# Patient Record
Sex: Female | Born: 1980 | Race: White | Hispanic: No | Marital: Married | Smoking: Never smoker
Health system: Southern US, Community
[De-identification: ages and names within clinical notes are randomized; demographics above are authoritative.]

## PROBLEM LIST (undated history)

## (undated) DIAGNOSIS — C719 Malignant neoplasm of brain, unspecified: Secondary | ICD-10-CM

## (undated) DIAGNOSIS — R569 Unspecified convulsions: Secondary | ICD-10-CM

## (undated) DIAGNOSIS — R232 Flushing: Secondary | ICD-10-CM

## (undated) DIAGNOSIS — D497 Neoplasm of unspecified behavior of endocrine glands and other parts of nervous system: Secondary | ICD-10-CM

## (undated) DIAGNOSIS — D496 Neoplasm of unspecified behavior of brain: Secondary | ICD-10-CM

## (undated) DIAGNOSIS — R5382 Chronic fatigue, unspecified: Secondary | ICD-10-CM

## (undated) DIAGNOSIS — G40A01 Absence epileptic syndrome, not intractable, with status epilepticus: Secondary | ICD-10-CM

## (undated) DIAGNOSIS — C801 Malignant (primary) neoplasm, unspecified: Secondary | ICD-10-CM

## (undated) DIAGNOSIS — T8859XA Other complications of anesthesia, initial encounter: Secondary | ICD-10-CM

## (undated) DIAGNOSIS — D1803 Hemangioma of intra-abdominal structures: Secondary | ICD-10-CM

## (undated) DIAGNOSIS — R4702 Dysphasia: Secondary | ICD-10-CM

## (undated) DIAGNOSIS — B999 Unspecified infectious disease: Secondary | ICD-10-CM

## (undated) DIAGNOSIS — R51 Headache: Secondary | ICD-10-CM

## (undated) DIAGNOSIS — F419 Anxiety disorder, unspecified: Secondary | ICD-10-CM

## (undated) DIAGNOSIS — R42 Dizziness and giddiness: Secondary | ICD-10-CM

## (undated) DIAGNOSIS — T4145XA Adverse effect of unspecified anesthetic, initial encounter: Secondary | ICD-10-CM

## (undated) DIAGNOSIS — D649 Anemia, unspecified: Secondary | ICD-10-CM

## (undated) DIAGNOSIS — R110 Nausea: Secondary | ICD-10-CM

## (undated) DIAGNOSIS — O9270 Unspecified disorders of lactation: Secondary | ICD-10-CM

## (undated) DIAGNOSIS — M792 Neuralgia and neuritis, unspecified: Secondary | ICD-10-CM

## (undated) DIAGNOSIS — K769 Liver disease, unspecified: Secondary | ICD-10-CM

## (undated) DIAGNOSIS — IMO0002 Reserved for concepts with insufficient information to code with codable children: Secondary | ICD-10-CM

## (undated) DIAGNOSIS — R4189 Other symptoms and signs involving cognitive functions and awareness: Secondary | ICD-10-CM

## (undated) HISTORY — PX: WISDOM TOOTH EXTRACTION: SHX21

## (undated) HISTORY — DX: Chronic fatigue, unspecified: R53.82

## (undated) HISTORY — DX: Headache: R51

## (undated) HISTORY — PX: EYE SURGERY: SHX253

## (undated) HISTORY — DX: Neuralgia and neuritis, unspecified: M79.2

## (undated) HISTORY — DX: Liver disease, unspecified: K76.9

## (undated) HISTORY — DX: Flushing: R23.2

## (undated) HISTORY — DX: Nausea: R11.0

## (undated) HISTORY — PX: ABDOMINAL HYSTERECTOMY: SHX81

## (undated) HISTORY — DX: Other symptoms and signs involving cognitive functions and awareness: R41.89

## (undated) HISTORY — DX: Dysphasia: R47.02

## (undated) HISTORY — PX: ROTATOR CUFF REPAIR: SHX139

## (undated) HISTORY — DX: Unspecified disorders of lactation: O92.70

---
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
## 2001-03-06 ENCOUNTER — Other Ambulatory Visit: Admission: RE | Admit: 2001-03-06 | Discharge: 2001-03-06 | Payer: Self-pay | Admitting: *Deleted

## 2001-05-23 ENCOUNTER — Encounter: Payer: Self-pay | Admitting: Internal Medicine

## 2001-05-23 ENCOUNTER — Encounter: Admission: RE | Admit: 2001-05-23 | Discharge: 2001-05-23 | Payer: Self-pay | Admitting: Internal Medicine

## 2001-06-08 ENCOUNTER — Encounter: Payer: Self-pay | Admitting: Internal Medicine

## 2001-06-08 ENCOUNTER — Ambulatory Visit (HOSPITAL_COMMUNITY): Admission: RE | Admit: 2001-06-08 | Discharge: 2001-06-08 | Payer: Self-pay | Admitting: Internal Medicine

## 2001-07-05 ENCOUNTER — Other Ambulatory Visit: Admission: RE | Admit: 2001-07-05 | Discharge: 2001-07-05 | Payer: Self-pay | Admitting: *Deleted

## 2001-07-18 DIAGNOSIS — C801 Malignant (primary) neoplasm, unspecified: Secondary | ICD-10-CM

## 2001-07-18 HISTORY — DX: Malignant (primary) neoplasm, unspecified: C80.1

## 2001-11-05 ENCOUNTER — Encounter: Payer: Self-pay | Admitting: Internal Medicine

## 2001-11-05 ENCOUNTER — Ambulatory Visit (HOSPITAL_COMMUNITY): Admission: RE | Admit: 2001-11-05 | Discharge: 2001-11-05 | Payer: Self-pay | Admitting: Internal Medicine

## 2002-02-20 ENCOUNTER — Other Ambulatory Visit: Admission: RE | Admit: 2002-02-20 | Discharge: 2002-02-20 | Payer: Self-pay | Admitting: *Deleted

## 2002-04-26 ENCOUNTER — Other Ambulatory Visit: Admission: RE | Admit: 2002-04-26 | Discharge: 2002-04-26 | Payer: Self-pay | Admitting: General Surgery

## 2002-04-30 ENCOUNTER — Encounter: Payer: Self-pay | Admitting: General Surgery

## 2002-04-30 ENCOUNTER — Encounter: Admission: RE | Admit: 2002-04-30 | Discharge: 2002-04-30 | Payer: Self-pay | Admitting: General Surgery

## 2003-07-19 HISTORY — PX: OTHER SURGICAL HISTORY: SHX169

## 2005-01-29 ENCOUNTER — Ambulatory Visit (HOSPITAL_COMMUNITY): Admission: RE | Admit: 2005-01-29 | Discharge: 2005-01-29 | Payer: Self-pay | Admitting: Sports Medicine

## 2006-08-09 ENCOUNTER — Ambulatory Visit (HOSPITAL_COMMUNITY): Admission: RE | Admit: 2006-08-09 | Discharge: 2006-08-09 | Payer: Self-pay | Admitting: Obstetrics and Gynecology

## 2006-09-20 ENCOUNTER — Ambulatory Visit (HOSPITAL_COMMUNITY): Admission: RE | Admit: 2006-09-20 | Discharge: 2006-09-20 | Payer: Self-pay | Admitting: Obstetrics and Gynecology

## 2006-09-28 ENCOUNTER — Ambulatory Visit (HOSPITAL_COMMUNITY): Admission: RE | Admit: 2006-09-28 | Discharge: 2006-09-28 | Payer: Self-pay | Admitting: Obstetrics and Gynecology

## 2006-10-30 ENCOUNTER — Inpatient Hospital Stay (HOSPITAL_COMMUNITY): Admission: AD | Admit: 2006-10-30 | Discharge: 2006-10-30 | Payer: Self-pay | Admitting: Obstetrics and Gynecology

## 2006-11-24 ENCOUNTER — Inpatient Hospital Stay (HOSPITAL_COMMUNITY): Admission: AD | Admit: 2006-11-24 | Discharge: 2006-11-24 | Payer: Self-pay | Admitting: Obstetrics and Gynecology

## 2006-12-01 ENCOUNTER — Inpatient Hospital Stay (HOSPITAL_COMMUNITY): Admission: AD | Admit: 2006-12-01 | Discharge: 2006-12-01 | Payer: Self-pay | Admitting: Obstetrics and Gynecology

## 2006-12-06 ENCOUNTER — Inpatient Hospital Stay (HOSPITAL_COMMUNITY): Admission: AD | Admit: 2006-12-06 | Discharge: 2006-12-09 | Payer: Self-pay | Admitting: Obstetrics and Gynecology

## 2006-12-25 ENCOUNTER — Encounter: Admission: RE | Admit: 2006-12-25 | Discharge: 2007-01-23 | Payer: Self-pay | Admitting: Obstetrics and Gynecology

## 2007-01-24 ENCOUNTER — Encounter: Admission: RE | Admit: 2007-01-24 | Discharge: 2007-02-20 | Payer: Self-pay | Admitting: Obstetrics and Gynecology

## 2007-02-14 ENCOUNTER — Encounter: Admission: RE | Admit: 2007-02-14 | Discharge: 2007-02-14 | Payer: Self-pay | Admitting: Sports Medicine

## 2007-03-01 ENCOUNTER — Encounter: Admission: RE | Admit: 2007-03-01 | Discharge: 2007-03-01 | Payer: Self-pay | Admitting: Sports Medicine

## 2007-03-15 ENCOUNTER — Encounter: Admission: RE | Admit: 2007-03-15 | Discharge: 2007-03-15 | Payer: Self-pay | Admitting: Sports Medicine

## 2007-07-16 ENCOUNTER — Encounter: Admission: RE | Admit: 2007-07-16 | Discharge: 2007-07-16 | Payer: Self-pay | Admitting: Neurosurgery

## 2007-07-26 ENCOUNTER — Emergency Department (HOSPITAL_COMMUNITY): Admission: EM | Admit: 2007-07-26 | Discharge: 2007-07-26 | Payer: Self-pay | Admitting: Emergency Medicine

## 2007-07-27 ENCOUNTER — Emergency Department (HOSPITAL_COMMUNITY): Admission: EM | Admit: 2007-07-27 | Discharge: 2007-07-27 | Payer: Self-pay | Admitting: Emergency Medicine

## 2007-08-13 ENCOUNTER — Ambulatory Visit: Payer: Self-pay | Admitting: Hospitalist

## 2007-08-13 ENCOUNTER — Inpatient Hospital Stay (HOSPITAL_COMMUNITY): Admission: EM | Admit: 2007-08-13 | Discharge: 2007-08-14 | Payer: Self-pay | Admitting: Emergency Medicine

## 2007-08-18 ENCOUNTER — Emergency Department (HOSPITAL_COMMUNITY): Admission: EM | Admit: 2007-08-18 | Discharge: 2007-08-18 | Payer: Self-pay | Admitting: Emergency Medicine

## 2007-08-24 ENCOUNTER — Encounter: Admission: RE | Admit: 2007-08-24 | Discharge: 2007-08-24 | Payer: Self-pay | Admitting: General Surgery

## 2008-01-18 ENCOUNTER — Emergency Department (HOSPITAL_COMMUNITY): Admission: EM | Admit: 2008-01-18 | Discharge: 2008-01-18 | Payer: Self-pay | Admitting: Emergency Medicine

## 2008-03-14 ENCOUNTER — Encounter: Admission: RE | Admit: 2008-03-14 | Discharge: 2008-03-14 | Payer: Self-pay | Admitting: Sports Medicine

## 2008-03-26 ENCOUNTER — Encounter: Admission: RE | Admit: 2008-03-26 | Discharge: 2008-03-26 | Payer: Self-pay | Admitting: Sports Medicine

## 2008-04-08 ENCOUNTER — Encounter: Admission: RE | Admit: 2008-04-08 | Discharge: 2008-04-08 | Payer: Self-pay | Admitting: Sports Medicine

## 2008-06-18 ENCOUNTER — Encounter: Admission: RE | Admit: 2008-06-18 | Discharge: 2008-06-18 | Payer: Self-pay | Admitting: Neurosurgery

## 2009-07-24 ENCOUNTER — Telehealth: Payer: Self-pay | Admitting: Internal Medicine

## 2009-07-24 ENCOUNTER — Ambulatory Visit: Payer: Self-pay | Admitting: Internal Medicine

## 2009-07-24 DIAGNOSIS — R079 Chest pain, unspecified: Secondary | ICD-10-CM | POA: Insufficient documentation

## 2009-07-24 DIAGNOSIS — J45909 Unspecified asthma, uncomplicated: Secondary | ICD-10-CM | POA: Insufficient documentation

## 2009-07-24 DIAGNOSIS — J309 Allergic rhinitis, unspecified: Secondary | ICD-10-CM | POA: Insufficient documentation

## 2009-07-27 ENCOUNTER — Telehealth (INDEPENDENT_AMBULATORY_CARE_PROVIDER_SITE_OTHER): Payer: Self-pay | Admitting: *Deleted

## 2009-07-27 LAB — CONVERTED CEMR LAB
Basophils Absolute: 0 10*3/uL (ref 0.0–0.1)
Basophils Relative: 0 % (ref 0–1)
Eosinophils Absolute: 0.1 10*3/uL (ref 0.0–0.7)
Eosinophils Relative: 1 % (ref 0–5)
HCT: 39.4 % (ref 36.0–46.0)
Hemoglobin: 12.7 g/dL (ref 12.0–15.0)
Lymphocytes Relative: 23 % (ref 12–46)
Lymphs Abs: 1.4 10*3/uL (ref 0.7–4.0)
MCHC: 32.2 g/dL (ref 30.0–36.0)
MCV: 93.8 fL (ref 78.0–100.0)
Monocytes Absolute: 0.4 10*3/uL (ref 0.1–1.0)
Monocytes Relative: 6 % (ref 3–12)
Neutro Abs: 4.5 10*3/uL (ref 1.7–7.7)
Neutrophils Relative %: 70 % (ref 43–77)
Platelets: 217 10*3/uL (ref 150–400)
RBC: 4.2 M/uL (ref 3.87–5.11)
RDW: 13.3 % (ref 11.5–15.5)
WBC: 6.4 10*3/uL (ref 4.0–10.5)

## 2010-04-19 ENCOUNTER — Emergency Department (HOSPITAL_COMMUNITY): Admission: EM | Admit: 2010-04-19 | Discharge: 2010-04-19 | Payer: Self-pay | Admitting: Emergency Medicine

## 2010-08-17 NOTE — Progress Notes (Signed)
Summary: pain  Phone Note Call from Patient Call back at [facility] Phone [phone]   Details for Reason: PATIENT LEFT MESSAGE ON VM REQUESTING CALL BACK ABOUT "WHY IS SHE HAVING PAIN, WHEN CXR WAS NEGATIVE" Summary of Call: Discussed with Dr. Alwyn Ren, ?torn chest wall muscle.  Discussed with patient.  Advised to start meds, rest.  OV in 3 weeks.  ED if increased pain, sob, or sxs.  Reviewed meds with pt.      Initial call taken by: Shary Decamp,  July 24, 2009 5:29 PM  Follow-up for Phone Call        see Appendum to Xray & labs. CT scan w & w/o if symptoms persist or progress Follow-up by: Marga Melnick MD,  July 24, 2009 6:05 PM

## 2010-08-17 NOTE — Progress Notes (Signed)
Summary: lab results  Phone Note Outgoing Call   Summary of Call: left message to call  office.................Marland KitchenFelecia Deloach CMA  July 27, 2009 9:17 AM   This is best report possible. Xray will not show soft tissue(cartilage/ muscle)  injury to intercostal structures ,but no displaced rib fractures or pneumothorax.  Excellent ; blood counts & screen for pulmonary emboli are normal .   Follow-up for Phone Call        pt aware...........Marland KitchenFelecia Deloach CMA  July 27, 2009 2:56 PM

## 2010-08-17 NOTE — Progress Notes (Signed)
Summary: rx- need directions  Phone Note Refill Request Message from:  Fax from Pharmacy [facility] fax [phone]  Refills Requested: Medication #1:  VICODIN 10-325 take as directed. Need Direction for a control.  Initial call taken by: Barb Merino,  July 24, 2009 4:56 PM  Follow-up for Phone Call        1 by mouth every 4 hours prn Follow-up by: Shary Decamp,  July 24, 2009 5:23 PM

## 2010-08-17 NOTE — Assessment & Plan Note (Signed)
Summary: NEW ACUTE/SELP PAY/NS/KDC/pneumonia   Vital Signs:  Patient profile:   30 year old female Weight:      183 pounds O2 Sat:      100 % Temp:     98.5 degrees F oral Pulse rate:   72 / minute Resp:     16 per minute BP sitting:   120 / 82  (left arm)  Vitals Entered By: Jeremy Johann CMA (July 24, 2009 1:45 PM) CC: bronchitis   CC:  bronchitis.  History of Present Illness: Bronchitis symptoms X 4 weeks; chest pain X 1 week with progression. The patient reports resting chest pain, diaphoresis, shortness of breath, palpitations (with MDI), and light headedness. She  denies nausea, vomiting, dizziness, syncope, or  indigestion.  The pain is described as constant, sharp, and burning.  The pain is located in the right anterior chest.  The pain radiates to the back in scapular area.  The pain is brought on or made worse by deep breathing and upper body movement.  Vicodin for DDD "helps" .Rx: Steroid doespack (stopped 2 days short), Zpack X 2. PMH of asthma.  Preventive Screening-Counseling & Management  Alcohol-Tobacco     Smoking Status: never  Caffeine-Diet-Exercise     Does Patient Exercise: yes  Allergies (verified): No Known Drug Allergies  Past History:  Past Medical History: DDD, Dr Channing Mutters Allergic rhinitis Asthma PTX age 70, spontaneous resolution; VItamin D deficiency  Past Surgical History: Knee surgery ; G 2 P 2 ,  Caesarean section X1  Family History: Father: CAD, thyroid disease Mother: ovarian CA, arthritis Siblings: neg  Social History: Occupation:[employer] Never Smoked Alcohol use-no Regular exercise-yes Smoking Status:  never Does Patient Exercise:  yes  Review of Systems General:  Complains of chills and fever. ENT:  Complains of decreased hearing and ringing in ears; denies nasal congestion and sinus pressure; No frontal headache, facial pain. CV:  Complains of difficulty breathing at night and difficulty breathing while lying  down; denies bluish discoloration of lips or nails, swelling of feet, and swelling of hands. Resp:  Complains of coughing up blood and sputum productive; denies chest pain with inspiration and pleuritic; "Striations of blood in mucus". Scant sputum ,green/ yellow.. Derm:  Denies lesion(s) and rash. Heme:  Denies abnormal bruising and bleeding.  Physical Exam  General:  well-nourished, alert,appropriate and cooperative throughout examination; moderately uncomfortable-appearing.   Eyes:  No corneal or conjunctival inflammation noted.no icterus. Perrla. Ears:  External ear exam shows no significant lesions or deformities.  Otoscopic examination reveals clear canals, tympanic membranes are intact bilaterally without bulging, retraction, inflammation or discharge. Hearing is grossly normal bilaterally. Nose:  External nasal examination shows no deformity or inflammation. Nasal mucosa are pink and moist without lesions or exudates. Mouth:  Oral mucosa and oropharynx without lesions or exudates.  Teeth in good repair. Chest Wall:  chest wall tenderness.   Lungs:  R decreased breath sounds.Splinting on R . Harsh barking cough Heart:  Normal rate and regular rhythm. S1 and S2 normal without gallop, murmur, click, rub. S4 Pulses:  R and L carotid,radial,dorsalis pedis and posterior tibial pulses are full and equal bilaterally Extremities:  No clubbing, cyanosis, edema. Neg Homan's  Neurologic:  alert & oriented X3.   Skin:  Intact without suspicious lesions or rashes. Damp skin Cervical Nodes:  No lymphadenopathy noted Axillary Nodes:  No palpable lymphadenopathy Psych:  memory intact for recent and remote, normally interactive, and good eye contact.  Impression & Recommendations:  Problem # 1:  CHEST PAIN (ICD-786.50)  R/O rib fracture vs PTX(Note: PMH of PTX)  Orders: Venipuncture (29562) T-D-Dimer Fibrin Derivatives Quantitive (13086-57846) T-2 View CXR (71020TC)  Problem # 2:   BRONCHITIS-ACUTE (ICD-466.0)  Protracted over 4 weeks  Orders: Venipuncture (96295) T-2 View CXR (71020TC)  Her updated medication list for this problem includes:    Tussionex Pennkinetic Er 8-10 Mg/3ml Lqcr (Chlorpheniramine-hydrocodone) .Marland Kitchen... 1 tsp q 8 hrs as needed cough    Symbicort 160-4.5 Mcg/act Aero (Budesonide-formoterol fumarate) .Marland Kitchen... 1-2 puffs q 12 hrs; gargle & spit after use    Avelox Abc Pack 400 Mg Tabs (Moxifloxacin hcl) .Marland Kitchen... 1 once daily  Problem # 3:  ASTHMA (ICD-493.90)  Clinically no wheezing  Orders: Venipuncture (28413) T-2 View CXR (71020TC)  Her updated medication list for this problem includes:    Symbicort 160-4.5 Mcg/act Aero (Budesonide-formoterol fumarate) .Marland Kitchen... 1-2 puffs q 12 hrs; gargle & spit after use  Complete Medication List: 1)  Soma 350 Mg Tabs (Carisoprodol) .... Take as directed 2)  Vicodin 10-325  .... Take as directed 3)  Tussionex Pennkinetic Er 8-10 Mg/32ml Lqcr (Chlorpheniramine-hydrocodone) .Marland Kitchen.. 1 tsp q 8 hrs as needed cough 4)  Symbicort 160-4.5 Mcg/act Aero (Budesonide-formoterol fumarate) .Marland Kitchen.. 1-2 puffs q 12 hrs; gargle & spit after use 5)  Avelox Abc Pack 400 Mg Tabs (Moxifloxacin hcl) .Marland Kitchen.. 1 once daily  Patient Instructions: 1)  Meds as directed. Remain @ [facility] until call report received. Prescriptions: AVELOX ABC PACK 400 MG TABS (MOXIFLOXACIN HCL) 1 once daily  #5 x 0   Entered and Authorized by:   Marga Melnick MD   Signed by:   Marga Melnick MD on 07/24/2009   Method used:   Historical   RxID:   [id number] SYMBICORT 160-4.5 MCG/ACT AERO (BUDESONIDE-FORMOTEROL FUMARATE) 1-2 puffs q 12 hrs; gargle & spit after use  #1 x 5   Entered and Authorized by:   Marga Melnick MD   Signed by:   Marga Melnick MD on 07/24/2009   Method used:   Historical   RxID:   [id number] TUSSIONEX PENNKINETIC ER 8-10 MG/5ML LQCR (CHLORPHENIRAMINE-HYDROCODONE) 1 tsp q 8 hrs as needed cough  #90 cc x 0   Entered and Authorized by:    Marga Melnick MD   Signed by:   Marga Melnick MD on 07/24/2009   Method used:   Print then Give to Patient   RxID:   [id number] VICODIN 10-325 take as directed  #30 x 0   Entered and Authorized by:   Marga Melnick MD   Signed by:   Marga Melnick MD on 07/24/2009   Method used:   Print then Give to Patient   RxID:   [id number]

## 2010-09-30 LAB — RAPID URINE DRUG SCREEN, HOSP PERFORMED
Amphetamines: NOT DETECTED
Barbiturates: NOT DETECTED
Benzodiazepines: NOT DETECTED
Cocaine: NOT DETECTED
Opiates: POSITIVE — AB
Tetrahydrocannabinol: NOT DETECTED

## 2010-09-30 LAB — URINALYSIS, ROUTINE W REFLEX MICROSCOPIC
Bilirubin Urine: NEGATIVE
Glucose, UA: NEGATIVE mg/dL
Hgb urine dipstick: NEGATIVE
Ketones, ur: 15 mg/dL — AB
Nitrite: NEGATIVE
Protein, ur: NEGATIVE mg/dL
Specific Gravity, Urine: 1.029 (ref 1.005–1.030)
Urobilinogen, UA: 0.2 mg/dL (ref 0.0–1.0)
pH: 5.5 (ref 5.0–8.0)

## 2010-09-30 LAB — POCT PREGNANCY, URINE: Preg Test, Ur: NEGATIVE

## 2010-11-30 NOTE — Discharge Summary (Signed)
NAME:  Victoria Bates, Victoria Bates               ACCOUNT NO.:  [id number]   MEDICAL RECORD NO.:  [id number]          PATIENT TYPE:  INP   LOCATION:  9144                          FACILITY:  [facility]   PHYSICIAN:  Juluis Mire, M.D.   DATE OF BIRTH:  [birth year]   DATE OF ADMISSION:  12/06/2006  DATE OF DISCHARGE:  12/09/2006                               DISCHARGE SUMMARY   ADMISSION DIAGNOSES:  1. Intrauterine pregnancy at 38-1/2 weeks estimated gestational age.  2. Spontaneous rupture of membranes.  3. Bulging disk of lumbar spine.   DISCHARGE DIAGNOSIS:  Status post low transverse cesarean section, a  viable female infant.   PROCEDURE:  Low transverse cesarean section.   REASON FOR ADMISSION:  Please see written H&P.   HOSPITAL COURSE:  With a 56 year old gravida 2, para 1-0-0-1 that  presented to [facility] at 39 weeks estimated  gestational age with spontaneous rupture of membranes.  Fluid was noted  to be clear.  The patient denied any vaginal bleeding irregular  contractions.  She did have a history of ruptured L4 disk and had  discussed having a primary cesarean delivery.  The patient now declined  this cesarean delivery requested the Pitocin augmentation.  Fetal heart  tones were reactive.  Cervix was noted to be 2 cm dilated throughout the  course of the day and the night.  The patient made no further progress  and decision was made to proceed with a primary low transverse cesarean  section.  The patient had been previously given an epidural and was now  dosed to an adequate surgical level.  A low transverse incision was made  with delivery of a viable female infant weighing 7 pounds 1 ounce Apgars  of 9 at 1 minute and 9 at 5 minutes.  The patient tolerated the  procedure well and taken to the recovery room in stable condition.  On  postoperative day #1 the patient was without complaint.  Vital signs  were stable.  Fundus firm and nontender.  Abdomen soft with good return  of bowel function and laboratory findings revealed hemoglobin of 8.1.  The patient was started on iron supplementation and CBC was ordered for  the morning on. Postoperative day #2 to the patient did desire early  discharge. Vital signs were stable and she was afebrile.  Fundus firm  and nontender.  Incision was clean, dry and intact.  Laboratory findings  revealed hemoglobin of stable of 8.1. Discharge instructions were  reviewed and the patient was later discharged home.   CONDITION ON DISCHARGE:  Stable, diet regular as tolerated.   ACTIVITY:  No heavy lifting, no driving x2 weeks, no vaginal entry.   FOLLOW UP:  Patient to follow up in the office in 2-3 days for staple  removal.  She is to call for temperature greater than 100 degrees,  persistent nausea, vomiting, heavy vaginal bleeding and/or redness or  drainage from incisional site.   DISCHARGE MEDICATIONS:  1. Dilaudid 2 mg #30 one p.o. every 4-6 hours.  2. Motrin 600 mg every 6 hours.  3. Ferrous sulfate  325 mg one p.o. b.i.d.  4. Prenatal vitamins one p.o. daily.      Julio Sicks, N.P.      Juluis Mire, M.D.  Electronically Signed    CC/MEDQ  D:  01/14/2007  T:  01/15/2007  Job:  [id number]

## 2010-11-30 NOTE — Discharge Summary (Signed)
NAME:  Victoria Bates, Victoria Bates               ACCOUNT NO.:  [id number]   MEDICAL RECORD NO.:  [id number]          PATIENT TYPE:  INP   LOCATION:  3305                         FACILITY:  [facility]   PHYSICIAN:  Eliseo Gum, M.D.   DATE OF BIRTH:  12/24/1980   DATE OF ADMISSION:  08/12/2007  DATE OF DISCHARGE:  08/14/2007                               DISCHARGE SUMMARY   CONSULTATIONS:  Anselmo Rod, M.D.  gastroenterology.   CONTINUITY DOCTOR:  Dr. Roger Shelter.   DISCHARGE DIAGNOSES:  1. Gastroenteritis.  2. Mild gastritis.  3. Lumbar disk rupture.  4. Asthma.   DISCHARGE MEDICATIONS:  1. Protonix 40 mg b.i.d.  2. Phenergan 12.5 mg every 6 hours for nausea as needed.  3. Vicodin 5/325 mg every 8 hours for pain as needed.   DISPOSITION AND FOLLOWUP:  Victoria Bates will followup with Dr. Roger Shelter in  the outpatient clinic for resolution of her symptoms, abdominal pain.  Her hemoglobin level need to be follow up.   PROCEDURE PERFORMED:  1. Endoscopy that shows mild gastritis.  Normal esophagus and      erythematous lesion on the vocal cord.  No peptic ulcer disease.      No Mallory-Weis tear.  2. CT of the abdomen and pelvis shows ill-defined inflammatory changes      in the proximal small bowel mesentery.  These findings suggest      nonspecific enteritis without evidence of obstruction or ileus.      Otherwise unremarkable CT of the abdomen.  CT of the pelvis was      negative.   HISTORY OF PRESENT ILLNESS:  The patient is a 30 year old female who  presented to the emergency department  complaining of nausea and  vomiting, abdominal pain, an episode of hematemesis. She is complaining  of epigastric left side abdominal pain that is started 2 days prior to  admission.  She describes 7 to 10 episodes of vomiting. She denies  diarrhea or any constipation.   VITAL SIGNS:  Blood pressure 108/54.  Pulse 108.  Respirations  18.  Temperature 99.4.   PHYSICAL EXAMINATION:  GENERAL:  The patient  was in no acute distress.  ABDOMEN:  Bowel sounds positive for tenderness  on the epigastrium area  and tender on the left lower quadrant.  No guarding.  No rebound.  Abdomen is soft.  NEUROLOGICAL:  The patient oriented.  No focal findings.   LABORATORY DATA:  Hemoglobin 13.3, white blood cells 11, platelets 185.  AMC 10.5, MCV 92, lipase 22.  Pregnancy test negative.  UA negative.  Sodium 130, potassium 3.5, chloride 100, bicarb 22, BUN 11, creatinine  0.6, glucose 115, bilirubin 1, alkaline phosphatase 38, AST 19, ALT 16,  protein 7.3, calcium 8.8, TSH 0.445, PT 13.9, INR 1.6, magnesium 1.6.   HOSPITAL COURSE:  1. Nausea and vomiting, hematemesis.  The patient was admitted to the      hospital. She was started on two large-bore IV fluids. She was      started on normal saline 150 ml/hr.  Hemoglobin was checked every  12 hours.  She responded to fluids.  Hemoglobin decreased to 11.7      then to 10.9 but she was stable, asymptomatic. On admission, she      was orthostatic but then on the day of discharge she was not      orthostatic.  Vital signs blood pressure 120 118/63 lying.      Standing blood pressure 125/65 that was on the second day of      hospitalization.  She was started on Protonix IV b.i.d.  she had an      endoscopy with results as above.  2. Left lower quadrant abdominal pain.  This was possibly secondary to      enteritis.  The patient was started on fluids and bowel rest. she      was afebrile and responding clinically.  The patient was also      advanced diet as tolerated. She was not vomiting and she denies any      diarrhea during hospitalization.  The abdominal pain improved on      the second day.   DISCHARGE LABORATORY DATA/VITALS:  On discharge the patient was stable.  Blood pressure 125/65.  Heart rate 88.  Respirations  19.  She was  tolerating liquids.  Sodium 136, potassium 3.8, chloride 109, bicarb 23,  BUN 5, creatinine 0.61, glucose 79, white  blood cells 3.8, hemoglobin  10.9, hematocrit 31.4, platelets 152. PHYSICAL EXAMINATION/> ABDOMEN:  Was soft, non-tender, and nondistended on the day of discharge.      Hartley Barefoot, MD  Electronically Signed     ______________________________  Eliseo Gum, M.D.    BR/MEDQ  D:  08/14/2007  T:  08/15/2007  Job:  [id number]   cc:   Solon Palm, D.O.

## 2010-11-30 NOTE — Op Note (Signed)
NAME:  Victoria Bates, Victoria Bates               ACCOUNT NO.:  [id number]   MEDICAL RECORD NO.:  [id number]          PATIENT TYPE:  INP   LOCATION:  3305                         FACILITY:  [facility]   PHYSICIAN:  Anselmo Rod, M.D.  DATE OF BIRTH:  05/04/1981   DATE OF PROCEDURE:  08/15/2007  DATE OF DISCHARGE:  08/14/2007                               OPERATIVE REPORT   PROCEDURE PERFORMED:  Esophagogastroduodenoscopy.   ENDOSCOPIST:  Anselmo Rod, M.D.   INSTRUMENT USED:  Pentax video panendoscope.   INDICATIONS FOR PROCEDURE:  Hematemesis and epigastric pain in a 26-year-  old white female, rule out peptic ulcer disease, esophagitis, gastritis,  etc.  The patient has a longstanding history of nonsteroidal use.  The  patient has been taking large doses of nonsteroidals, ibuprofen 800 mg 3  times a day for 2 years because of chronic back pain.   PREPROCEDURE PREPARATION:  Informed consent was procured from the  patient.  The patient was fasted for 8 hours prior to the procedure.  Risks and benefits of the procedure were discussed with her in great  detail.   PREPROCEDURE PHYSICAL:  The patient had stable vital signs.  NECK:  Supple.  Chest clear to auscultation.  S1, S2 regular.  Abdomen soft  with normal bowel sounds.  Epigastric tenderness on palpation with  guarding, no rebound or rigidity.   DESCRIPTION OF THE PROCEDURE:  The patient was placed in the left  lateral decubitus position and sedated with 75 mcg of Fentanyl and 5 mg  of Versed given intravenously in slow incremental doses.  Once the  patient was adequately sedated and maintained on low-flow oxygen and  continuous cardiac monitoring, the Pentax video panendoscope was  advanced through the mouthpiece over the tongue into the esophagus under  direct vision. The entire esophagus was widely patent with no evidence  of ring, stricture, masses, esophagitis or Barrett's mucosa.  The scope  was advanced into the stomach.  Mild  gastritis was seen in the proximal  stomach.  No ulcers, erosions, masses or polyps were seen.  The proximal  small bowel appeared normal.  There was no evidence of peptic ulcer  disease, no Mallory-Weiss tear was noticed.  A small erythematous spot  was noted in the vocal cord that may have been a source of hematemesis  from the patient's retching.  The patient tolerated the procedure well  without immediate complications.   IMPRESSION:  1. Normal-appearing esophagus and gastroesophageal junction.  No      evidence of a Mallory-Weiss tear.  2. Mild gastritis.  3. Normal-appearing proximal small bowel.  4. Erythematous lesion vocal cords questions source of hematemesis.   RECOMMENDATION:  1. Discontinue Dilaudid pump.  The patient probably has a viral      gastroenteritis.  Clear liquid diet should be started and advance      as tolerated.  2. Transfer the patient from the ICU to the floor.  3. PPI is to be continued.  4. Avoid all nonsteroidals.  5. Limit narcotic use.  6. Advance diet as tolerated.  Anselmo Rod, M.D.  Electronically Signed     JNM/MEDQ  D:  08/15/2007  T:  08/15/2007  Job:  [id number]

## 2010-11-30 NOTE — H&P (Signed)
NAME:  Victoria Bates, Victoria Bates               ACCOUNT NO.:  [id number]   MEDICAL RECORD NO.:  [id number]          PATIENT TYPE:  AMB   LOCATION:  SDC                           FACILITY:  [facility]   PHYSICIAN:  Guy Sandifer. Henderson Cloud, M.D. DATE OF BIRTH:  [birth year]   DATE OF ADMISSION:  12/07/2006  DATE OF DISCHARGE:                              HISTORY & PHYSICAL   CHIEF COMPLAINT:  1. Intrauterine pregnancy at 38-1/2 weeks estimated gestational age.  2. Bulging disk.   HISTORY OF PRESENT ILLNESS:  This patient is a 30 year old white female,  G2, P18, with an EDC of December 21, 2006, established by early exam and 15  week ultrasound, who has had pregnancy complicated by lower back pain  with a bulging disk.  This has required pain medication essentially  daily.  After careful discussion of options for delivery by the primary  cesarean section or vaginal delivery with the possibility of  exacerbation of her back pain, she is being admitted for cesarean  section.  Patient understands that her pain could increase in spite of  cesarean section as well.  Potential risks and complications have been  discussed preoperatively.   PAST MEDICAL HISTORY:  See prenatal history and physical.   PAST SURGICAL HISTORY:  See prenatal history and physical.   OBSTETRICAL HISTORY:  See prenatal history and physical.   FAMILY HISTORY:  See prenatal history and physical.   MEDICATIONS:  Vicodin daily.  Prenatal vitamin daily.   No known drug allergies.   She is sensitive to LATEX.   REVIEW OF SYSTEMS:  NEURO:  Denies headache.  CARDIAC:  No chest pain.  PULMONARY:  Denies shortness of breath.   PHYSICAL EXAMINATION:  VITAL SIGNS:  Height 5 feet 8 inches.  Weight 192  pounds.  Blood pressure 114/80.  HEENT:  Without thyromegaly.  LUNGS:  Clear to auscultation.  HEART:  Regular rate and rhythm.  BACK:  Without CVA tenderness.  BREASTS:  Not examined.  ABDOMEN:  Gravid, nontender.  PELVIC:  Cervix is 3 cm dilated, 70%  effaced, -2 station vertex.  EXTREMITIES:  Grossly within normal limits.  NEUROLOGIC:  Grossly within normal limits.   ASSESSMENT:  1. Intrauterine pregnancy at 38-1/2 weeks estimated gestational age.  2. Bulging disks.   PLAN:  Cesarean section.      Guy Sandifer Henderson Cloud, M.D.  Electronically Signed     JET/MEDQ  D:  12/06/2006  T:  12/06/2006  Job:  [id number]

## 2010-11-30 NOTE — Op Note (Signed)
NAME:  Victoria Bates, Victoria Bates               ACCOUNT NO.:  [id number]   MEDICAL RECORD NO.:  [id number]          PATIENT TYPE:  INP   LOCATION:  9162                          FACILITY:  [facility]   PHYSICIAN:  Guy Sandifer. Henderson Cloud, M.D. DATE OF BIRTH:  04/14/1981   DATE OF PROCEDURE:  12/07/2006  DATE OF DISCHARGE:                               OPERATIVE REPORT   PREOPERATIVE DIAGNOSES:  1. Intrauterine pregnancy at 38-1/2 weeks' estimated gestational age.  2. Spontaneous rupture of membranes.  3. Bulging disks in lower back.   POSTOPERATIVE DIAGNOSES:  1. Intrauterine pregnancy at 38-1/2 weeks' estimated gestational age.  2. Spontaneous rupture of membranes.  3. Bulging disks in lower back.   PROCEDURE:  Low-transverse cesarean section.   SURGEON:  Harold Hedge, M.D.   ANESTHESIA:  Epidural, Cristela Blue, M.D.   ESTIMATED BLOOD LOSS:  900 mL.   FINDINGS:  Viable female infant, Apgars of 9 and 9 at one and five  minutes, respectively.   INDICATIONS AND CONSENT:  This patient is a 30 year old married white  female, G2, P44, with an EDC of December 21, 2006 who has had bulging disks in  her lower back, given her pain throughout the pregnancy.  She has  required daily narcotics.  Approximately 2 days ago after discussion of  all the options, she was scheduled for cesarean section in an effort to  decrease the likelihood of aggravating her back pain during the second  stage of labor.  She then had spontaneous rupture of membranes at  approximately 7:30 p.m. on Dec 06, 2006.  She presented to the hospital  and at that time discussed options with Dr. Renaldo Fiddler.  A labor epidural  was placed, and she was started on Pitocin.  As of this morning, cervix  was unchanged per the nurse's check at 3 cm dilation.  Again, options  were discussed.  The fact that cesarean section eliminates second stage  of labor but is not of an absolute guarantee against further back pain  was emphasized.  Potential risks and  complications of the procedure were  discussed including but limited to infection, bowel, bladder, ureteral  damage, bleeding requiring transfusion of blood products with possible  HIV and hepatitis acquisition, DVT, PE, pneumonia.  After discussion of  all of the above, the patient is taken to operating room for cesarean  section.   PROCEDURE:  The patient is taken to operating room where she is  identified.  Epidural anesthetic is augmented to a surgical level, and  she is placed in dorsal supine position with 15-degree left lateral  wedge.  She is then prepped with Hibiclens and draped in sterile  fashion.  Foley catheter is already in place.  After testing for  adequate anesthesia, skin is entered through a Pfannenstiel incision,  and dissection is carried out layers to the peritoneum.  Peritoneum is  incised, extended superiorly and inferiorly.  Twenty mL of 0.25% plain  Marcaine are poured into the peritoneal cavity to help control any  discomfort the patient may experience.  The vesicouterine peritoneum is  then taken down cephalad laterally.  The bladder flap is developed.  The  bladder blade is placed.  Uterus is then incised in a low transverse  manner.  The uterine cavity is entered bluntly with a hemostat.  Uterine  incision is extended cephalad laterally with the fingers.  Vertex is  delivered without difficulty.  Oronasal pharynx are suctioned.  Remainder the baby is then delivered, and good cry and tone is noted.  Cord is clamped and cut.  The baby is handed to the waiting pediatrics  team.  Placenta is manually delivered.  Uterine cavity is clean.  Uterus  is closed in 2 running locking imbricating layers of 0 Monocryl suture.  Additional figure-of-eight is placed on the right angle to assure  complete hemostasis.  Anterior peritoneum is closed in a running fashion  with 0 Monocryl suture which was also used to reapproximate the  pyramidalis muscle in midline.  Anterior  rectus fascia is closed in  running fashion with 0 PDS, and the skin is closed with clips.  All  sponge, instrument and needle counts are correct, and the patient is  transferred to recovery room in stable condition.      Guy Sandifer Henderson Cloud, M.D.  Electronically Signed     JET/MEDQ  D:  12/07/2006  T:  12/07/2006  Job:  [id number]

## 2010-11-30 NOTE — Op Note (Signed)
NAME:  Victoria Bates, Victoria Bates               ACCOUNT NO.:  [id number]   MEDICAL RECORD NO.:  [id number]          PATIENT TYPE:  EMS   LOCATION:  ED                           FACILITY:  [facility]   PHYSICIAN:  Anselm Pancoast. Weatherly, M.D.DATE OF BIRTH:  06-21-1981   DATE OF PROCEDURE:  08/18/2007  DATE OF DISCHARGE:  08/18/2007                               OPERATIVE REPORT   PREOPERATIVE DIAGNOSIS:  Questionable foreign body in antecubital vein  right arm.   HISTORY:  Victoria Bates is a 30 year old female who was admitted to the  [facility] last weekend with hematemesis, had an IV started in  the right antecubital area and also one in the hand.  She was endoscoped  and found to have some bleeding from her duodenum.  She was not  transfused and approximately 2 days after she was hospitalized the  antecubital IV came out.  She had some bleeding.  She and the nurse put  some pressure on the area.  The IV was discontinued.  The patient has  felt that she could feel a little piece of the catheter in the vein  since then, was discharged and called the nursing coordinator some time  after her discharge and told her story to her.  They recommended that  she go to an urgent care facility which she did today and the physician  at the urgent care called me today after he saw her in the urgent care.  His name was Dr. Theadora Rama.  He also did an x-ray of the right  elbow.  We could not see any foreign body but the x-ray was placed on a  disk, I was called and agreed to see her in the emergency room.  When  she arrived here I called the nursing supervisor who was present when we  were discussing the options and all of [place] could possibly feel a little  firm area in the right antecubital vein just proximal to where the IV  had been inserted, but whether this was a thrombus or a piece of  catheter it was impossible to tell.  I discussed that I would try to  image it better with soft tissue views rotating  the arm to see if we  could definitely see a foreign body and the patient was in agreement.  She wanted [place] just to explore the area but we did take her over to x-ray  with her consent and did about 4 views, placed a little BB on the little  area where the needle had been inserted and on through views there was  no evidence of any catheter in the vein proximal to the where the BB  was.  Then on the fourth view I actually took a little Angiocath similar  to what they had used for the IV and taped it to the skin just proximal  and you could see the hub but the plastic catheter is not radiopaque as  we noted on this film.  The patient then felt that there was definitely  something there and I agreed to make a  little incision and explore the  area, not assuring that there was a catheter remnant.  The supervisor  was still here at this time and we used a little Betadine prep.  I put a  tourniquet at about a 40 venous occlusion pressure proximal in case  there would be something so it would not float up into the arm and then  I used about 5 mL of 1% Xylocaine to anesthetize the skin.  I made a  little transverse incision about 2 cm proximal to where the catheter  needle insertion site and then elevated the dilated vein over a  hemostat.  I then made a little transverse incision and the only thing  that we can find is there is definitely a little thrombus in the area.  I used a small hemostat and a straight hemostat to kind of open the vein  and pulled out the thrombus but we find no evidence of any catheter.  The patient works in Animal nutritionist in hair removal area and she  wanted to feel the area and put a sterile glove on so she could actually  feel the area and then gave her a little piece of the thrombus that she  rubbed between her fingers and can feel the consistency of the little  thrombus.  Afterwards with the vein elevated I could not feel anything  else in the vein.  I had placed a  little single tie of 4-0 chromic  proximal to the little otomy and there was no evidence of any  backbleeding.   At this point I recommended that we stop any further exploration.  We  offered to make a little incision about an inch further more proximal if  she desired and she said no and since we cannot feel anything further.  I then closed the skin which was a little transverse incision with three  5-0 nylon sutures and placed a little triple antibiotic ointment and a  little light dressing.  I will see her back in the office on Friday to  remove the sutures.  I do not think this is a foreign body within the  vein and I am sure the nursing supervisor will be checking the records  at [facility] on what was noted on the chart over there since she called  back in and etc.  I will be happy to see her in the office any time next  week if she is having a problem.  I do not think she needs IV  antibiotics or p.o. antibiotics and she already has Vicodin which she is  taking for back problems so no additional pain was given.  The little  incision should not bleed and I will see her in the office on Friday and  remove the little skin sutures.  This is Dr. Anselm Pancoast. Weatherly  dictating this consultation and minor exploration looking for a piece of  broken catheter and nothing was found.  We did remove a little thrombus  within the vein and could not feel anything afterwards.           ______________________________  Anselm Pancoast. Zachery Dakins, M.D.     WJW/MEDQ  D:  08/18/2007  T:  08/19/2007  Job:  [id number]

## 2010-12-03 NOTE — Discharge Summary (Signed)
[facility]  Patient:    Victoria Bates, Victoria Bates                      MRN: [id number] Adm. Date:  [id number] Disc. Date: [id number] Attending:  Tonye Royalty Dictator:   Antony Contras, N.P.                           Discharge Summary  DISCHARGE DIAGNOSES:          Intrauterine pregnancy at term, normal spontaneous vaginal delivery of a viable infant over a midline episiotomy with second degree extension, repair of bilateral vaginal lacerations.  HISTORY OF PRESENT ILLNESS:   Patient is an 30 year old prima gravida with an LMP January 15, 2001, [facility] October 23, 2000.  Prenatal course was complicated by a history of asthma.  Patient is also Rh negative.  PRENATAL LABORATORIES:        Blood type A-.  Antibody screen negative.  RPR, HBSAG, HIV nonreactive.  Toxoplasmosis negative.  MSAFP normal.  GBS negative.  HOSPITAL COURSE:              Patient presented on October 27, 2000 with onset of active labor.  On admission she was 3-4 cm, 80% with intact membranes. Labor did progress to complete dilatation.  She delivered an Apgar 8/9 female infant over a midline episiotomy with second degree extension.  There was a shoulder cord x 1.  Also, repair of bilateral vaginal lacerations.  Postpartum course:  She remained afebrile, had no difficulty voiding, was able to be discharged on her second postpartum day in satisfactory condition.  She did receive RhoGAM prior to discharge.  LABORATORIES:                 CBC:  Hematocrit 28.7, hemoglobin 9.9, WBC 13.5, platelets 164,000.  DISPOSITION:                  Follow-up in six weeks.  Continue prenatal vitamins and iron.  Motrin and Tylox for pain. DD:  11/20/00 TD:  11/20/00 Job: [id number]

## 2011-04-07 LAB — DIFFERENTIAL
Basophils Absolute: 0
Basophils Absolute: 0
Basophils Absolute: 0
Basophils Absolute: 0
Basophils Absolute: 0
Basophils Absolute: 0
Basophils Relative: 0
Basophils Relative: 1
Basophils Relative: 0
Basophils Relative: 0
Basophils Relative: 0
Basophils Relative: 1
Eosinophils Absolute: 0
Eosinophils Absolute: 0
Eosinophils Absolute: 0
Eosinophils Absolute: 0
Eosinophils Absolute: 0
Eosinophils Absolute: 0
Eosinophils Relative: 0
Eosinophils Relative: 0
Eosinophils Relative: 0
Eosinophils Relative: 0
Eosinophils Relative: 0
Eosinophils Relative: 1
Lymphocytes Relative: 17
Lymphocytes Relative: 25
Lymphocytes Relative: 10 — ABNORMAL LOW
Lymphocytes Relative: 17
Lymphocytes Relative: 3 — ABNORMAL LOW
Lymphocytes Relative: 3 — ABNORMAL LOW
Lymphs Abs: 1.3
Lymphs Abs: 1.8
Lymphs Abs: 0.2 — ABNORMAL LOW
Lymphs Abs: 0.3 — ABNORMAL LOW
Lymphs Abs: 0.5 — ABNORMAL LOW
Lymphs Abs: 0.7
Monocytes Absolute: 0.4
Monocytes Absolute: 0.6
Monocytes Absolute: 0.2
Monocytes Absolute: 0.3
Monocytes Absolute: 0.3
Monocytes Absolute: 0.4
Monocytes Relative: 6
Monocytes Relative: 8
Monocytes Relative: 10
Monocytes Relative: 2 — ABNORMAL LOW
Monocytes Relative: 2 — ABNORMAL LOW
Monocytes Relative: 5
Neutro Abs: 4.6
Neutro Abs: 5.9
Neutro Abs: 10.5 — ABNORMAL HIGH
Neutro Abs: 2.7
Neutro Abs: 4.8
Neutro Abs: 7.9 — ABNORMAL HIGH
Neutrophils Relative %: 66
Neutrophils Relative %: 77
Neutrophils Relative %: 72
Neutrophils Relative %: 85 — ABNORMAL HIGH
Neutrophils Relative %: 95 — ABNORMAL HIGH
Neutrophils Relative %: 95 — ABNORMAL HIGH

## 2011-04-07 LAB — URINALYSIS, ROUTINE W REFLEX MICROSCOPIC
Bilirubin Urine: NEGATIVE
Bilirubin Urine: NEGATIVE
Glucose, UA: NEGATIVE
Glucose, UA: NEGATIVE
Glucose, UA: NEGATIVE
Hgb urine dipstick: NEGATIVE
Hgb urine dipstick: NEGATIVE
Hgb urine dipstick: NEGATIVE
Ketones, ur: >80 — AB
Ketones, ur: >80 — AB
Ketones, ur: 40 — AB
Nitrite: NEGATIVE
Nitrite: NEGATIVE
Nitrite: NEGATIVE
Protein, ur: NEGATIVE
Protein, ur: NEGATIVE
Protein, ur: NEGATIVE
Specific Gravity, Urine: 1.025
Specific Gravity, Urine: 1.029
Specific Gravity, Urine: 1.025
Urobilinogen, UA: 0.2
Urobilinogen, UA: 0.2
Urobilinogen, UA: 0.2
pH: 6
pH: 6
pH: 5

## 2011-04-07 LAB — CBC
HCT: 38.8
HCT: 39.5
HCT: 31.4 — ABNORMAL LOW
HCT: 33 — ABNORMAL LOW
HCT: 33.7 — ABNORMAL LOW
HCT: 39.4
Hemoglobin: 13.3
Hemoglobin: 13.5
Hemoglobin: 10.9 — ABNORMAL LOW
Hemoglobin: 11.3 — ABNORMAL LOW
Hemoglobin: 11.7 — ABNORMAL LOW
Hemoglobin: 13.3
MCHC: 34.2
MCHC: 34.4
MCHC: 33.8
MCHC: 34.3
MCHC: 34.7
MCHC: 34.9
MCV: 91.1
MCV: 91.7
MCV: 91.1
MCV: 91.1
MCV: 91.1
MCV: 92
Platelets: 263
Platelets: 292
Platelets: 152
Platelets: 172
Platelets: 185
Platelets: 204
RBC: 4.26
RBC: 4.31
RBC: 3.45 — ABNORMAL LOW
RBC: 3.63 — ABNORMAL LOW
RBC: 3.7 — ABNORMAL LOW
RBC: 4.28
RDW: 11.8
RDW: 12.3
RDW: 11.6
RDW: 11.8
RDW: 11.9
RDW: 12
WBC: 7
WBC: 7.6
WBC: 11 — ABNORMAL HIGH
WBC: 3.8 — ABNORMAL LOW
WBC: 5.7
WBC: 8.4

## 2011-04-07 LAB — COMPREHENSIVE METABOLIC PANEL
ALT: 13
ALT: 12
ALT: 16
AST: 16
AST: 16
AST: 19
Albumin: 4.3
Albumin: 2.8 — ABNORMAL LOW
Albumin: 4.2
Alkaline Phosphatase: 35 — ABNORMAL LOW
Alkaline Phosphatase: 27 — ABNORMAL LOW
Alkaline Phosphatase: 38 — ABNORMAL LOW
BUN: 6
BUN: 11
BUN: 5 — ABNORMAL LOW
CO2: 19
CO2: 22
CO2: 23
Calcium: 9.4
Calcium: 8 — ABNORMAL LOW
Calcium: 8.8
Chloride: 106
Chloride: 100
Chloride: 109
Creatinine, Ser: 0.59
Creatinine, Ser: 0.58
Creatinine, Ser: 0.61
GFR calc Af Amer: >60
GFR calc Af Amer: >60
GFR calc Af Amer: >60
GFR calc non Af Amer: >60
GFR calc non Af Amer: >60
GFR calc non Af Amer: >60
Glucose, Bld: 77
Glucose, Bld: 115 — ABNORMAL HIGH
Glucose, Bld: 79
Potassium: 3.9
Potassium: 3.5
Potassium: 3.8
Sodium: 138
Sodium: 130 — ABNORMAL LOW
Sodium: 136
Total Bilirubin: 1.1
Total Bilirubin: 1
Total Bilirubin: 1
Total Protein: 7.3
Total Protein: 5.6 — ABNORMAL LOW
Total Protein: 7.3

## 2011-04-07 LAB — ABO/RH: ABO/RH(D): A NEG

## 2011-04-07 LAB — POCT I-STAT CREATININE
Creatinine, Ser: 0.7
Operator id: 192351

## 2011-04-07 LAB — I-STAT 8, (EC8 V) (CONVERTED LAB)
Acid-base deficit: 1
BUN: 8
Bicarbonate: 22.6
Chloride: 108
Glucose, Bld: 91
HCT: 44
Hemoglobin: 15
Operator id: 192351
Potassium: 3.6
Sodium: 138
TCO2: 24
pCO2, Ven: 33.1 — ABNORMAL LOW
pH, Ven: 7.443 — ABNORMAL HIGH

## 2011-04-07 LAB — TSH: TSH: 0.445

## 2011-04-07 LAB — GASTRIC OCCULT BLOOD (1-CARD TO LAB): Occult Blood, Gastric: POSITIVE — AB

## 2011-04-07 LAB — SEDIMENTATION RATE: Sed Rate: 9

## 2011-04-07 LAB — RAPID URINE DRUG SCREEN, HOSP PERFORMED
Amphetamines: NEGATIVE — AB
Barbiturates: NEGATIVE — AB
Benzodiazepines: POSITIVE — AB
Cocaine: NEGATIVE — AB
Opiates: POSITIVE — AB
Tetrahydrocannabinol: NEGATIVE — AB

## 2011-04-07 LAB — CULTURE, BLOOD (ROUTINE X 2)
Culture: NO GROWTH
Culture: NO GROWTH

## 2011-04-07 LAB — CROSSMATCH
ABO/RH(D): A NEG
Antibody Screen: NEGATIVE

## 2011-04-07 LAB — LIPASE, BLOOD
Lipase: 18
Lipase: 22

## 2011-04-07 LAB — POCT PREGNANCY, URINE
Operator id: 192351
Operator id: 198171
Preg Test, Ur: NEGATIVE
Preg Test, Ur: NEGATIVE

## 2011-04-07 LAB — H. PYLORI ANTIBODY, IGG: H Pylori IgG: 0.6

## 2011-04-07 LAB — HIV ANTIBODY (ROUTINE TESTING W REFLEX): HIV: NONREACTIVE

## 2011-04-07 LAB — PROTIME-INR
INR: 1.1
Prothrombin Time: 13.9

## 2011-04-07 LAB — PREGNANCY, URINE: Preg Test, Ur: NEGATIVE

## 2011-04-07 LAB — MAGNESIUM: Magnesium: 1.6

## 2011-04-07 LAB — LACTIC ACID, PLASMA: Lactic Acid, Venous: 0.6

## 2011-04-07 LAB — T4, FREE: Free T4: 0.99

## 2011-08-07 ENCOUNTER — Emergency Department (HOSPITAL_COMMUNITY): Payer: Self-pay

## 2011-08-07 ENCOUNTER — Emergency Department (HOSPITAL_COMMUNITY)
Admission: EM | Admit: 2011-08-07 | Discharge: 2011-08-07 | Disposition: A | Discharge status: Home / Self Care | Payer: Self-pay | Attending: Emergency Medicine | Admitting: Emergency Medicine

## 2011-08-07 ENCOUNTER — Encounter (HOSPITAL_COMMUNITY): Payer: Self-pay

## 2011-08-07 DIAGNOSIS — R112 Nausea with vomiting, unspecified: Secondary | ICD-10-CM | POA: Insufficient documentation

## 2011-08-07 DIAGNOSIS — J45909 Unspecified asthma, uncomplicated: Secondary | ICD-10-CM | POA: Insufficient documentation

## 2011-08-07 DIAGNOSIS — M549 Dorsalgia, unspecified: Secondary | ICD-10-CM | POA: Insufficient documentation

## 2011-08-07 DIAGNOSIS — R109 Unspecified abdominal pain: Secondary | ICD-10-CM | POA: Insufficient documentation

## 2011-08-07 LAB — WET PREP, GENITAL
Clue Cells Wet Prep HPF POC: NONE SEEN
Trich, Wet Prep: NONE SEEN
Yeast Wet Prep HPF POC: NONE SEEN

## 2011-08-07 LAB — CBC
HCT: 38.1 % (ref 36.0–46.0)
Hemoglobin: 13 g/dL (ref 12.0–15.0)
MCH: 31.2 pg (ref 26.0–34.0)
MCHC: 34.1 g/dL (ref 30.0–36.0)
MCV: 91.4 fL (ref 78.0–100.0)
Platelets: 277 10*3/uL (ref 150–400)
RBC: 4.17 MIL/uL (ref 3.87–5.11)
RDW: 11.9 % (ref 11.5–15.5)
WBC: 9.3 10*3/uL (ref 4.0–10.5)

## 2011-08-07 LAB — POCT PREGNANCY, URINE: Preg Test, Ur: NEGATIVE

## 2011-08-07 LAB — COMPREHENSIVE METABOLIC PANEL
ALT: 23 U/L (ref 0–35)
AST: 20 U/L (ref 0–37)
Albumin: 4.3 g/dL (ref 3.5–5.2)
Alkaline Phosphatase: 46 U/L (ref 39–117)
BUN: 16 mg/dL (ref 6–23)
CO2: 27 mEq/L (ref 19–32)
Calcium: 9.7 mg/dL (ref 8.4–10.5)
Chloride: 101 mEq/L (ref 96–112)
Creatinine, Ser: 0.64 mg/dL (ref 0.50–1.10)
GFR calc Af Amer: >90 mL/min (ref >90)
GFR calc non Af Amer: >90 mL/min (ref >90)
Glucose, Bld: 116 mg/dL — ABNORMAL HIGH (ref 70–99)
Potassium: 3.4 mEq/L — ABNORMAL LOW (ref 3.5–5.1)
Sodium: 139 mEq/L (ref 135–145)
Total Bilirubin: 0.6 mg/dL (ref 0.3–1.2)
Total Protein: 7.8 g/dL (ref 6.0–8.3)

## 2011-08-07 LAB — URINALYSIS, ROUTINE W REFLEX MICROSCOPIC
Glucose, UA: NEGATIVE mg/dL
Hgb urine dipstick: NEGATIVE
Ketones, ur: 15 mg/dL — AB
Leukocytes, UA: NEGATIVE
Nitrite: NEGATIVE
Protein, ur: 30 mg/dL — AB
Specific Gravity, Urine: 1.037 — ABNORMAL HIGH (ref 1.005–1.030)
Urobilinogen, UA: 0.2 mg/dL (ref 0.0–1.0)
pH: 6 (ref 5.0–8.0)

## 2011-08-07 LAB — DIFFERENTIAL
Basophils Absolute: 0 10*3/uL (ref 0.0–0.1)
Basophils Relative: 0 % (ref 0–1)
Eosinophils Absolute: 0 10*3/uL (ref 0.0–0.7)
Eosinophils Relative: 0 % (ref 0–5)
Lymphocytes Relative: 4 % — ABNORMAL LOW (ref 12–46)
Lymphs Abs: 0.4 10*3/uL — ABNORMAL LOW (ref 0.7–4.0)
Monocytes Absolute: 0.4 10*3/uL (ref 0.1–1.0)
Monocytes Relative: 4 % (ref 3–12)
Neutro Abs: 8.5 10*3/uL — ABNORMAL HIGH (ref 1.7–7.7)
Neutrophils Relative %: 92 % — ABNORMAL HIGH (ref 43–77)

## 2011-08-07 LAB — URINE MICROSCOPIC-ADD ON

## 2011-08-07 LAB — LIPASE, BLOOD: Lipase: 14 U/L (ref 11–59)

## 2011-08-07 MED ORDER — DICYCLOMINE HCL 10 MG/ML IM SOLN
20.0000 mg | Freq: Once | INTRAMUSCULAR | Status: AC
  Administered 2011-08-07: 20 mg via INTRAMUSCULAR
  Filled 2011-08-07: qty 2

## 2011-08-07 MED ORDER — PROMETHAZINE HCL 12.5 MG PO TABS: 12.5000 mg | ORAL_TABLET | Freq: Four times a day (QID) | ORAL | Status: DC | PRN

## 2011-08-07 MED ORDER — ONDANSETRON HCL 4 MG/2ML IJ SOLN
4.0000 mg | Freq: Once | INTRAMUSCULAR | Status: AC
  Administered 2011-08-07: 4 mg via INTRAVENOUS
  Filled 2011-08-07: qty 2

## 2011-08-07 MED ORDER — MORPHINE SULFATE 4 MG/ML IJ SOLN
4.0000 mg | Freq: Once | INTRAMUSCULAR | Status: AC
  Administered 2011-08-07: 4 mg via INTRAVENOUS
  Filled 2011-08-07: qty 1

## 2011-08-07 MED ORDER — PROMETHAZINE HCL 25 MG/ML IJ SOLN
12.5000 mg | Freq: Once | INTRAMUSCULAR | Status: AC
  Administered 2011-08-07: 12.5 mg via INTRAVENOUS
  Filled 2011-08-07: qty 1

## 2011-08-07 MED ORDER — ONDANSETRON 8 MG PO TBDP: 8.0000 mg | ORAL_TABLET | Freq: Three times a day (TID) | ORAL | Status: AC | PRN

## 2011-08-07 MED ORDER — IOHEXOL 300 MG/ML  SOLN
100.0000 mL | Freq: Once | INTRAMUSCULAR | Status: AC | PRN
  Administered 2011-08-07: 100 mL via INTRAVENOUS

## 2011-08-07 MED ORDER — HYDROCODONE-ACETAMINOPHEN 5-500 MG PO TABS: 1.0000 | ORAL_TABLET | Freq: Four times a day (QID) | ORAL | Status: AC | PRN

## 2011-08-07 MED ORDER — SODIUM CHLORIDE 0.9 % IV BOLUS (SEPSIS)
1000.0000 mL | Freq: Once | INTRAVENOUS | Status: AC
  Administered 2011-08-07: 1000 mL via INTRAVENOUS

## 2011-08-07 NOTE — ED Provider Notes (Signed)
Medical screening examination/treatment/procedure(s) were conducted as a shared visit with non-physician practitioner(s) and myself.  I personally evaluated the patient during the encounter  Patient seen examined. Pain on palpation to left upper and left lower quadrant. Will order abdominal CT  Toy Baker, MD 08/07/11 1413

## 2011-08-07 NOTE — ED Notes (Signed)
LLQ pain, onset last night, report had vomited multiple times. Pain 10/10, worsen at movement, cramping and aching. Denied blood in emesis, denied any urinary symptoms. Not passing gas, Last intake yesterday morning. LBM 2-3 days ago. Left side tender upon palpation. Hypoactive bowel sound noted. Pt tearful.

## 2011-08-07 NOTE — ED Notes (Signed)
Pt c/o LLQ pain, onset last night, with nausea and vomiting.

## 2011-08-07 NOTE — ED Provider Notes (Signed)
History     CSN: [id number]  Arrival date & time 08/07/11  1121   First MD Initiated Contact with Patient 08/07/11 1142      No chief complaint on file.   (Consider location/radiation/quality/duration/timing/severity/associated sxs/prior treatment) Patient is a 31 y.o. female presenting with abdominal pain. The history is provided by the patient.  Abdominal Pain The primary symptoms of the illness include abdominal pain, nausea and vomiting. The primary symptoms of the illness do not include shortness of breath, diarrhea or dysuria. The current episode started yesterday. The onset of the illness was sudden. The problem has been gradually worsening.  The patient states that she believes she is currently not pregnant. Additional symptoms associated with the illness include chills, anorexia and back pain. Symptoms associated with the illness do not include constipation, urgency or frequency.  Pt states pain is mainly in the left upper abdomen and midline. States nausea, persistent vomiting, unable to keep anything down. Denies fever, chills, diarrhea, vaginal bleeding or discharge, no urinary symptoms. Pt states pain worsened with movement and palpation, and vomiting. Not relieved with anything.  Past Medical History  Diagnosis Date  . Asthma     Past Surgical History  Procedure Date  . Cesarean section     No family history on file.  History  Substance Use Topics  . Smoking status: Never Smoker   . Smokeless tobacco: Not on file  . Alcohol Use: No    OB History    Grav Para Term Preterm Abortions TAB SAB Ect Mult Living                  Review of Systems  Constitutional: Positive for chills.  HENT: Negative for ear pain, sore throat and rhinorrhea.   Eyes: Negative.   Respiratory: Negative for chest tightness and shortness of breath.   Cardiovascular: Negative.   Gastrointestinal: Positive for nausea, vomiting, abdominal pain and anorexia. Negative for diarrhea and  constipation.  Genitourinary: Negative for dysuria, urgency, frequency and flank pain.  Musculoskeletal: Positive for back pain.  Skin: Negative.   Neurological: Negative.   Psychiatric/Behavioral: Negative.     Allergies  Latex and Shellfish allergy  Home Medications  No current outpatient prescriptions on file.  BP 145/63  Pulse 126  Temp(Src) 98.3 F (36.8 C) (Oral)  Resp 18  SpO2 99%  LMP 08/02/2011  Physical Exam  Nursing note and vitals reviewed. Constitutional: She is oriented to person, place, and time. She appears well-developed and well-nourished.       Uncomfortable appearing  HENT:  Head: Normocephalic and atraumatic.  Eyes: Conjunctivae are normal.  Neck: Neck supple.  Cardiovascular: Regular rhythm and normal heart sounds.        tachycardic  Pulmonary/Chest: Effort normal and breath sounds normal. No respiratory distress.  Abdominal: Soft. Bowel sounds are normal. There is tenderness. There is no rebound and no guarding.       Tender to palpation in epigastric area, luq.  Genitourinary: Vagina normal. Uterus is tender. Cervix exhibits motion tenderness. Cervix exhibits no discharge and no friability. Right adnexum displays no mass and no tenderness. Left adnexum displays no mass and no tenderness.  Musculoskeletal: Normal range of motion. She exhibits no edema.  Neurological: She is alert and oriented to person, place, and time.  Skin: Skin is warm and dry. No rash noted.  Psychiatric: She has a normal mood and affect.    ED Course  Procedures (including critical care time)  Pt with abdominal pain,  diffuse, changes from upper to lower abdomen. Pelvic unremarkable other then CMT, however no discharge, unremarkable wet prep. Labs unremakable. Discussed with Dr. Freida Busman who saw the pt as well. Pt continues to have pain and nausea. Will do CT abd/pelvis for further evaluation.  Results for orders placed during the hospital encounter of 08/07/11  URINALYSIS,  ROUTINE W REFLEX MICROSCOPIC      Component Value Range   Color, Urine AMBER (*) YELLOW    APPearance TURBID (*) CLEAR    Specific Gravity, Urine 1.037 (*) 1.005 - 1.030    pH 6.0  5.0 - 8.0    Glucose, UA NEGATIVE  NEGATIVE (mg/dL)   Hgb urine dipstick NEGATIVE  NEGATIVE    Bilirubin Urine SMALL (*) NEGATIVE    Ketones, ur 15 (*) NEGATIVE (mg/dL)   Protein, ur 30 (*) NEGATIVE (mg/dL)   Urobilinogen, UA 0.2  0.0 - 1.0 (mg/dL)   Nitrite NEGATIVE  NEGATIVE    Leukocytes, UA NEGATIVE  NEGATIVE   CBC      Component Value Range   WBC 9.3  4.0 - 10.5 (K/uL)   RBC 4.17  3.87 - 5.11 (MIL/uL)   Hemoglobin 13.0  12.0 - 15.0 (g/dL)   HCT 32.4  40.1 - 02.7 (%)   MCV 91.4  78.0 - 100.0 (fL)   MCH 31.2  26.0 - 34.0 (pg)   MCHC 34.1  30.0 - 36.0 (g/dL)   RDW 25.3  66.4 - 40.3 (%)   Platelets 277  150 - 400 (K/uL)  DIFFERENTIAL      Component Value Range   Neutrophils Relative 92 (*) 43 - 77 (%)   Neutro Abs 8.5 (*) 1.7 - 7.7 (K/uL)   Lymphocytes Relative 4 (*) 12 - 46 (%)   Lymphs Abs 0.4 (*) 0.7 - 4.0 (K/uL)   Monocytes Relative 4  3 - 12 (%)   Monocytes Absolute 0.4  0.1 - 1.0 (K/uL)   Eosinophils Relative 0  0 - 5 (%)   Eosinophils Absolute 0.0  0.0 - 0.7 (K/uL)   Basophils Relative 0  0 - 1 (%)   Basophils Absolute 0.0  0.0 - 0.1 (K/uL)  COMPREHENSIVE METABOLIC PANEL      Component Value Range   Sodium 139  135 - 145 (mEq/L)   Potassium 3.4 (*) 3.5 - 5.1 (mEq/L)   Chloride 101  96 - 112 (mEq/L)   CO2 27  19 - 32 (mEq/L)   Glucose, Bld 116 (*) 70 - 99 (mg/dL)   BUN 16  6 - 23 (mg/dL)   Creatinine, Ser 4.74  0.50 - 1.10 (mg/dL)   Calcium 9.7  8.4 - 25.9 (mg/dL)   Total Protein 7.8  6.0 - 8.3 (g/dL)   Albumin 4.3  3.5 - 5.2 (g/dL)   AST 20  0 - 37 (U/L)   ALT 23  0 - 35 (U/L)   Alkaline Phosphatase 46  39 - 117 (U/L)   Total Bilirubin 0.6  0.3 - 1.2 (mg/dL)   GFR calc non Af Amer >90  >90 (mL/min)   GFR calc Af Amer >90  >90 (mL/min)  LIPASE, BLOOD      Component Value Range    Lipase 14  11 - 59 (U/L)  POCT PREGNANCY, URINE      Component Value Range   Preg Test, Ur NEGATIVE    URINE MICROSCOPIC-ADD ON      Component Value Range   Squamous Epithelial / LPF FEW (*) RARE  WBC, UA 0-2  <3 (WBC/hpf)   Bacteria, UA FEW (*) RARE    Urine-Other MUCOUS PRESENT    WET PREP, GENITAL      Component Value Range   Yeast, Wet Prep NONE SEEN  NONE SEEN    Trich, Wet Prep NONE SEEN  NONE SEEN    Clue Cells, Wet Prep NONE SEEN  NONE SEEN    WBC, Wet Prep HPF POC FEW (*) NONE SEEN    Ct Abdomen Pelvis W Contrast  08/07/2011  *RADIOLOGY REPORT*  Clinical Data: Left upper and lower quadrant abdominal pain. Nausea and vomiting.  CT ABDOMEN AND PELVIS WITH CONTRAST  Technique:  Multidetector CT imaging of the abdomen and pelvis was performed following the standard protocol during bolus administration of intravenous contrast.  Contrast: OMNIPAQUE IOHEXOL 300 MG/ML IV SOLN  Comparison: Abdominal pelvic CT 08/13/2007.  Findings: The lung bases are clear and there is no pleural effusion.  There is a probable subtle enhancing 7 mm lesion posteriorly in the dome of the right hepatic lobe on image 8. Although not as well seen previously, this may have been present and is most likely an incidental hemangioma. The liver otherwise appears normal.  The gallbladder, biliary system, pancreas and spleen appear normal. The adrenal glands and kidneys appear normal.  The bowel gas pattern is normal.  The appendix is not clearly visualized.  No inflammatory changes are identified within the abdomen or pelvis.  The uterus, ovaries and urinary bladder appears stable.  There are left pelvic phleboliths.  Within the right lateral abdominal wall, there is nodular soft tissue throughout the subcutaneous fat which appears similar to the prior examination.  No hernia is identified.  There are no acute osseous findings. A mild scoliosis appears unchanged.  IMPRESSION:  1.  No acute abdominal pelvic findings  identified.  No explanation for left-sided abdominal pain seen. 2.  Stable nodularity within the subcutaneous fat of the right lateral abdominal wall. This could reflect a postsurgical finding or soft tissue hemangioma.  Correlate clinically.  Original Report Authenticated By: Gerrianne Scale, M.D.    7:23 PM CT with no explanation for pt's pain. PT tolerating PO fluids in ED. Feels much better, pain improved, nausea improved. Suspect probable gastroenteritis, will d/c home with anti emetics, pain medications. Instructed to return if worsening.  No diagnosis found.    MDM          Lottie Mussel, PA 08/07/11 1925

## 2011-08-07 NOTE — ED Notes (Signed)
PA in room doing pelvic exam

## 2011-08-07 NOTE — ED Notes (Signed)
PA in room

## 2011-08-07 NOTE — ED Notes (Signed)
Pt medicated with pain med

## 2011-08-08 LAB — GC/CHLAMYDIA PROBE AMP, GENITAL
Chlamydia, DNA Probe: NEGATIVE
GC Probe Amp, Genital: NEGATIVE

## 2011-08-09 NOTE — ED Provider Notes (Signed)
Medical screening examination/treatment/procedure(s) were performed by non-physician practitioner and as supervising physician I was immediately available for consultation/collaboration.  Livingston Denner T Rhone Ozaki, MD 08/09/11 0941 

## 2011-08-13 ENCOUNTER — Encounter (HOSPITAL_COMMUNITY): Payer: Self-pay | Admitting: *Deleted

## 2012-07-03 ENCOUNTER — Other Ambulatory Visit: Payer: Self-pay | Admitting: Internal Medicine

## 2012-07-03 DIAGNOSIS — N644 Mastodynia: Secondary | ICD-10-CM

## 2012-07-12 ENCOUNTER — Other Ambulatory Visit: Payer: Self-pay | Admitting: Internal Medicine

## 2012-07-12 ENCOUNTER — Ambulatory Visit
Admission: RE | Admit: 2012-07-12 | Discharge: 2012-07-12 | Disposition: A | Discharge status: Home / Self Care | Payer: Medicaid Other | Source: Ambulatory Visit | Attending: Internal Medicine | Admitting: Internal Medicine

## 2012-07-12 DIAGNOSIS — N644 Mastodynia: Secondary | ICD-10-CM

## 2012-07-21 ENCOUNTER — Other Ambulatory Visit: Payer: Self-pay | Admitting: Physician Assistant

## 2012-07-26 ENCOUNTER — Other Ambulatory Visit (HOSPITAL_COMMUNITY)
Admission: RE | Admit: 2012-07-26 | Discharge: 2012-07-26 | Disposition: A | Discharge status: Home / Self Care | Payer: Medicaid Other | Source: Ambulatory Visit | Attending: Obstetrics and Gynecology | Admitting: Obstetrics and Gynecology

## 2012-07-26 ENCOUNTER — Other Ambulatory Visit: Payer: Self-pay | Admitting: Nurse Practitioner

## 2012-07-26 DIAGNOSIS — Z113 Encounter for screening for infections with a predominantly sexual mode of transmission: Secondary | ICD-10-CM | POA: Insufficient documentation

## 2012-07-26 DIAGNOSIS — R87810 Cervical high risk human papillomavirus (HPV) DNA test positive: Secondary | ICD-10-CM | POA: Insufficient documentation

## 2012-07-26 DIAGNOSIS — Z1151 Encounter for screening for human papillomavirus (HPV): Secondary | ICD-10-CM | POA: Insufficient documentation

## 2012-07-26 DIAGNOSIS — Z01419 Encounter for gynecological examination (general) (routine) without abnormal findings: Secondary | ICD-10-CM | POA: Insufficient documentation

## 2012-08-01 ENCOUNTER — Other Ambulatory Visit: Payer: Self-pay | Admitting: Internal Medicine

## 2012-08-01 DIAGNOSIS — R079 Chest pain, unspecified: Secondary | ICD-10-CM

## 2012-08-02 ENCOUNTER — Other Ambulatory Visit: Payer: Medicaid Other

## 2012-08-03 ENCOUNTER — Ambulatory Visit
Admission: RE | Admit: 2012-08-03 | Discharge: 2012-08-03 | Disposition: A | Discharge status: Home / Self Care | Payer: Medicaid Other | Source: Ambulatory Visit | Attending: Internal Medicine | Admitting: Internal Medicine

## 2012-08-03 DIAGNOSIS — R079 Chest pain, unspecified: Secondary | ICD-10-CM

## 2012-08-03 MED ORDER — IOHEXOL 300 MG/ML  SOLN
75.0000 mL | Freq: Once | INTRAMUSCULAR | Status: AC | PRN
  Administered 2012-08-03: 75 mL via INTRAVENOUS

## 2012-08-06 ENCOUNTER — Other Ambulatory Visit: Payer: Medicaid Other

## 2012-08-06 HISTORY — PX: OTHER SURGICAL HISTORY: SHX169

## 2012-08-09 ENCOUNTER — Encounter (HOSPITAL_COMMUNITY): Payer: Self-pay | Admitting: *Deleted

## 2012-08-09 ENCOUNTER — Emergency Department (HOSPITAL_COMMUNITY)
Admission: EM | Admit: 2012-08-09 | Discharge: 2012-08-10 | Disposition: A | Discharge status: Home / Self Care | Payer: Medicaid Other | Attending: Emergency Medicine | Admitting: Emergency Medicine

## 2012-08-09 DIAGNOSIS — Z3202 Encounter for pregnancy test, result negative: Secondary | ICD-10-CM | POA: Insufficient documentation

## 2012-08-09 DIAGNOSIS — N390 Urinary tract infection, site not specified: Secondary | ICD-10-CM | POA: Insufficient documentation

## 2012-08-09 DIAGNOSIS — Z79899 Other long term (current) drug therapy: Secondary | ICD-10-CM | POA: Insufficient documentation

## 2012-08-09 DIAGNOSIS — R0789 Other chest pain: Secondary | ICD-10-CM

## 2012-08-09 DIAGNOSIS — R109 Unspecified abdominal pain: Secondary | ICD-10-CM

## 2012-08-09 DIAGNOSIS — R350 Frequency of micturition: Secondary | ICD-10-CM | POA: Insufficient documentation

## 2012-08-09 DIAGNOSIS — R071 Chest pain on breathing: Secondary | ICD-10-CM | POA: Insufficient documentation

## 2012-08-09 DIAGNOSIS — R300 Dysuria: Secondary | ICD-10-CM | POA: Insufficient documentation

## 2012-08-09 DIAGNOSIS — I10 Essential (primary) hypertension: Secondary | ICD-10-CM | POA: Insufficient documentation

## 2012-08-09 DIAGNOSIS — Z8719 Personal history of other diseases of the digestive system: Secondary | ICD-10-CM | POA: Insufficient documentation

## 2012-08-09 DIAGNOSIS — J45909 Unspecified asthma, uncomplicated: Secondary | ICD-10-CM | POA: Insufficient documentation

## 2012-08-09 HISTORY — DX: Hemangioma of intra-abdominal structures: D18.03

## 2012-08-09 LAB — URINALYSIS, ROUTINE W REFLEX MICROSCOPIC
Bilirubin Urine: NEGATIVE
Glucose, UA: NEGATIVE mg/dL
Hgb urine dipstick: NEGATIVE
Ketones, ur: NEGATIVE mg/dL
Nitrite: NEGATIVE
Protein, ur: NEGATIVE mg/dL
Specific Gravity, Urine: 1.019 (ref 1.005–1.030)
Urobilinogen, UA: 0.2 mg/dL (ref 0.0–1.0)
pH: 6.5 (ref 5.0–8.0)

## 2012-08-09 LAB — URINE MICROSCOPIC-ADD ON

## 2012-08-09 LAB — PREGNANCY, URINE: Preg Test, Ur: NEGATIVE

## 2012-08-09 NOTE — ED Notes (Signed)
Pt c/o issues with hypertension and palpitations; increased complaints last few days; talked with cardiologist today because bp 190/140-- took extra metoprolol; bp 162/78 in triage currently; c/o left flank pain x 2 days; c/o right sided chest wall pain to back x 6 months; mammogram in December which was neg for any abnormalities in breast; recent ct for "something" in chest wall; states needs an mri; has an appt with cardiologist in the morning

## 2012-08-10 ENCOUNTER — Other Ambulatory Visit: Payer: Self-pay | Admitting: Cardiology

## 2012-08-10 DIAGNOSIS — I10 Essential (primary) hypertension: Secondary | ICD-10-CM

## 2012-08-10 LAB — CBC WITH DIFFERENTIAL/PLATELET
Basophils Absolute: 0 10*3/uL (ref 0.0–0.1)
Basophils Relative: 0 % (ref 0–1)
Eosinophils Absolute: 0 10*3/uL (ref 0.0–0.7)
Eosinophils Relative: 0 % (ref 0–5)
HCT: 35.2 % — ABNORMAL LOW (ref 36.0–46.0)
Hemoglobin: 12 g/dL (ref 12.0–15.0)
Lymphocytes Relative: 32 % (ref 12–46)
Lymphs Abs: 2.6 10*3/uL (ref 0.7–4.0)
MCH: 30.8 pg (ref 26.0–34.0)
MCHC: 34.1 g/dL (ref 30.0–36.0)
MCV: 90.3 fL (ref 78.0–100.0)
Monocytes Absolute: 0.5 10*3/uL (ref 0.1–1.0)
Monocytes Relative: 6 % (ref 3–12)
Neutro Abs: 4.8 10*3/uL (ref 1.7–7.7)
Neutrophils Relative %: 61 % (ref 43–77)
Platelets: 277 10*3/uL (ref 150–400)
RBC: 3.9 MIL/uL (ref 3.87–5.11)
RDW: 11.9 % (ref 11.5–15.5)
WBC: 8 10*3/uL (ref 4.0–10.5)

## 2012-08-10 LAB — COMPREHENSIVE METABOLIC PANEL
ALT: 12 U/L (ref 0–35)
AST: 15 U/L (ref 0–37)
Albumin: 3.9 g/dL (ref 3.5–5.2)
Alkaline Phosphatase: 36 U/L — ABNORMAL LOW (ref 39–117)
BUN: 10 mg/dL (ref 6–23)
CO2: 24 mEq/L (ref 19–32)
Calcium: 9.4 mg/dL (ref 8.4–10.5)
Chloride: 101 mEq/L (ref 96–112)
Creatinine, Ser: 0.82 mg/dL (ref 0.50–1.10)
GFR calc Af Amer: >90 mL/min (ref >90)
GFR calc non Af Amer: >90 mL/min (ref >90)
Glucose, Bld: 89 mg/dL (ref 70–99)
Potassium: 4.4 mEq/L (ref 3.5–5.1)
Sodium: 135 mEq/L (ref 135–145)
Total Bilirubin: 0.3 mg/dL (ref 0.3–1.2)
Total Protein: 7.4 g/dL (ref 6.0–8.3)

## 2012-08-10 LAB — TROPONIN I: Troponin I: <0.3 ng/mL (ref <0.30)

## 2012-08-10 MED ORDER — METHOCARBAMOL 500 MG PO TABS
1000.0000 mg | ORAL_TABLET | Freq: Once | ORAL | Status: AC
  Administered 2012-08-10: 1000 mg via ORAL
  Filled 2012-08-10: qty 2

## 2012-08-10 MED ORDER — PHENAZOPYRIDINE HCL 200 MG PO TABS: 200.0000 mg | ORAL_TABLET | Freq: Three times a day (TID) | ORAL | Status: DC

## 2012-08-10 MED ORDER — NITROFURANTOIN MONOHYD MACRO 100 MG PO CAPS: 100.0000 mg | ORAL_CAPSULE | Freq: Two times a day (BID) | ORAL | Status: DC

## 2012-08-10 MED ORDER — IBUPROFEN 800 MG PO TABS
800.0000 mg | ORAL_TABLET | Freq: Once | ORAL | Status: DC
  Filled 2012-08-10: qty 1

## 2012-08-10 MED ORDER — TRAMADOL HCL 50 MG PO TABS
50.0000 mg | ORAL_TABLET | Freq: Once | ORAL | Status: AC
  Administered 2012-08-10: 50 mg via ORAL
  Filled 2012-08-10: qty 1

## 2012-08-10 MED ORDER — HYDROCODONE-ACETAMINOPHEN 5-325 MG PO TABS: 1.0000 | ORAL_TABLET | Freq: Four times a day (QID) | ORAL | Status: DC | PRN

## 2012-08-10 MED ORDER — HYDROCODONE-ACETAMINOPHEN 5-325 MG PO TABS
1.0000 | ORAL_TABLET | Freq: Once | ORAL | Status: AC
  Administered 2012-08-10: 1 via ORAL
  Filled 2012-08-10: qty 1

## 2012-08-10 MED ORDER — NITROFURANTOIN MONOHYD MACRO 100 MG PO CAPS
100.0000 mg | ORAL_CAPSULE | Freq: Once | ORAL | Status: AC
  Administered 2012-08-10: 100 mg via ORAL
  Filled 2012-08-10: qty 1

## 2012-08-10 NOTE — ED Provider Notes (Signed)
History     CSN: [id number]  Arrival date & time 08/09/12  2130   First MD Initiated Contact with Patient 08/10/12 0019      No chief complaint on file.   (Consider location/radiation/quality/duration/timing/severity/associated sxs/prior treatment) HPIAmanda K Bates is a 32 y.o. female with a past medical history significant for liver hemangioma, hypertension, palpitations, flushing and sweating was being worked up by cardiology at [place], comes in complaining about left flank pain left lower quadrant pain and elevated blood pressures today. Patient called her cardiologist today was take cold to take extra metoprolol.  Patient's flank pain is been going on for 2 days, it is sharp 8/10, constant, also with a dull feeling in the left lower back. No history of kidney stones. No hematuria, she endorses pressure sensation after urinating as well as frequency. Patient currently using the new NuvaRing fo contraception and to regulate vaginal bleeding, she denies any current vaginal bleeding or vaginal discharge-no vaginal itching.  Patient also complains about right sided chest wall pain, this is been a problem in the past, it is to the lateral aspect of the right breast towards the axilla is a sharp shooting pain that radiates around to the back of the ribs. Is moderate to severe in intensity, she's used Tylenol for with little results. She says she was told not to take ibuprofen or aspirin by her cardiologist, and she says she gets "mean with narcotics."    Past Medical History  Diagnosis Date  . Asthma   . Liver hemangioma     Past Surgical History  Procedure Date  . Cesarean section     No family history on file.  History  Substance Use Topics  . Smoking status: Never Smoker   . Smokeless tobacco: Not on file  . Alcohol Use: No    OB History    Grav Para Term Preterm Abortions TAB SAB Ect Mult Living                  Review of Systems At least 10pt or greater review of  systems completed and are negative except where specified in the HPI.  Allergies  Latex and Shellfish allergy  Home Medications   Current Outpatient Rx  Name  Route  Sig  Dispense  Refill  . ACETAMINOPHEN 500 MG PO TABS   Oral   Take 1,000 mg by mouth 2 (two) times daily as needed. For pain         . ALPRAZOLAM 0.5 MG PO TABS   Oral   Take 0.5-1 mg by mouth 2 (two) times daily as needed. For anxiety         . B COMPLEX PO TABS   Oral   Take 1 tablet by mouth every morning.         Marland Kitchen VITAMIN D-3 PO   Oral   Take 1 capsule by mouth every morning.         . ETONOGESTREL-ETHINYL ESTRADIOL 0.12-0.015 MG/24HR VA RING   Vaginal   Place 1 each vaginally every 28 (twenty-eight) days. Insert vaginally and leave in place for 3 consecutive weeks, then remove for 1 week.         Marland Kitchen FERROUS SULFATE 325 (65 FE) MG PO TABS   Oral   Take 325 mg by mouth daily with breakfast.         . METOPROLOL SUCCINATE ER 25 MG PO TB24   Oral   Take 25 mg by mouth every morning.         Marland Kitchen  SERTRALINE HCL 50 MG PO TABS   Oral   Take 50 mg by mouth every morning.         Marland Kitchen TEMAZEPAM 30 MG PO CAPS   Oral   Take 30 mg by mouth at bedtime as needed. For sleep           BP 124/56  Pulse 74  Temp 98.4 F (36.9 C) (Oral)  Resp 21  SpO2 100%  LMP 07/26/2012  Physical Exam  Nursing notes reviewed.  Electronic medical record reviewed. VITAL SIGNS:   Filed Vitals:   08/09/12 2216 08/10/12 0006 08/10/12 0239 08/10/12 0435  BP: 187/92 124/56 159/86 152/88  Pulse:  74 77 73  Temp:      TempSrc:      Resp:  21 18 19   SpO2:  100% 100% 100%   CONSTITUTIONAL: Awake, oriented, appears non-toxic HENT: Atraumatic, normocephalic, oral mucosa pink and moist, airway patent. Nares patent without drainage. External ears normal. EYES: Conjunctiva clear, EOMI, PERRLA NECK: Trachea midline, non-tender, supple CARDIOVASCULAR: Normal heart rate, Normal rhythm, No murmurs, rubs,  gallops PULMONARY/CHEST: Clear to auscultation, no rhonchi, wheezes, or rales. Symmetrical breath sounds. Tenderness to palpation on the right sided chest wall from the right aspect of the right breast to the midaxillary line at about the third and fourth ribs. No masses palpated. Her some tenderness to palpation in the center of the chest as well. ABDOMINAL: Non-distended, soft, alternatives to palpation in the left lower quadrant, no rebound or guarding.  BS normal. NEUROLOGIC: Non-focal, moving all four extremities, no gross sensory or motor deficits. EXTREMITIES: No clubbing, cyanosis, or edema SKIN: Warm, Dry, No erythema, No rash  ED Course  Procedures (including critical care time)  Date: 08/10/2012  Rate: 84  Rhythm: normal sinus rhythm  QRS Axis: normal  Intervals: normal  ST/T Wave abnormalities: normal  Conduction Disutrbances: none  Narrative Interpretation: unremarkable  Labs Reviewed  URINALYSIS, ROUTINE W REFLEX MICROSCOPIC - Abnormal; Notable for the following:    APPearance CLOUDY (*)     Leukocytes, UA MODERATE (*)     All other components within normal limits  URINE MICROSCOPIC-ADD ON - Abnormal; Notable for the following:    Squamous Epithelial / LPF MANY (*)     Bacteria, UA FEW (*)     All other components within normal limits  CBC WITH DIFFERENTIAL - Abnormal; Notable for the following:    HCT 35.2 (*)     All other components within normal limits  COMPREHENSIVE METABOLIC PANEL - Abnormal; Notable for the following:    Alkaline Phosphatase 36 (*)     All other components within normal limits  PREGNANCY, URINE  TROPONIN I  URINE CULTURE   No results found.   1. Hypertension   2. Left flank pain   3. Dysuria   4. UTI (lower urinary tract infection)   5. Chest wall pain       MDM  Victoria Bates is a 32 y.o. female who presents with a variety of complaints including elevated blood pressure, chest wall pain including substernal chest pain and  right-sided rib pain, also left flank pain. Patient says these paroxysms of high blood pressure are sometimes associated with sweating and flushing, she says she has been tested for pheochromocytoma last November with a 24-hour urine test which was equivocal. Patient's blood pressure in the emergency department has been elevated at times, she did take an extra metoprolol extended release - we'll monitor blood pressure for  any further interventions at this time.  Patient initially refused any narcotic pain medicines, patients do want to try tramadol and Robaxin.  Review of laboratory, CBC, troponin, CMP are within normal limits. Urinalysis is contaminated with many squamous epithelial cells, 3-6 white cells few bacteria and moderate leukocyte esterase, no nitrites. Discussed with the patient obtaining another sample or catheterization for cleaner sample which she declined, I am inclined to treat the patient she is symptomatic with dysuria and frequency and some left flank pain. We'll treat the patient with Pyridium and Macrobid in the emergency department. Patient eventually acquiesced to narcotic pain medicine and will get some Vicodin for her chest wall pain.  Further discussion with the patient, she had a CT of her chest just 2 weeks ago and last year did have a CT of her abdomen and pelvis, CT of the chest did show the superior aspects of both kidneys and the adrenal glands, no abnormal findings were seen by the radiologist. Radiologist did make remarks the patient that they would rather have had an MRI and patient is supposed to have an MRI of the abdomen at some point in the future.  I would agree with that diagnostic plan as the patient's presentation is concerning for pheochromocytoma although is a rare diagnosis, her symptoms of paroxysmal hypertension, sweating and flushing are concerning.  Patient has followup with her cardiologist today and can discuss her elevated blood pressures, referred her  back to her primary care physician to discuss further testing concerning her paroxysms of hypertension, flushing sweating and chest wall pain as well.  I explained the diagnosis and have given explicit precautions to return to the ER including any other new or worsening symptoms. The patient understands and accepts the medical plan as it's been dictated and I have answered their questions. Discharge instructions concerning home care and prescriptions have been given.  The patient is STABLE and is discharged to home in good condition.        Jones Skene, MD 08/10/12 [phone]

## 2012-08-11 LAB — URINE CULTURE
Colony Count: NO GROWTH
Culture: NO GROWTH

## 2012-08-14 ENCOUNTER — Other Ambulatory Visit: Payer: Self-pay | Admitting: Nurse Practitioner

## 2012-08-16 ENCOUNTER — Ambulatory Visit
Admission: RE | Admit: 2012-08-16 | Discharge: 2012-08-16 | Disposition: A | Discharge status: Home / Self Care | Payer: Medicaid Other | Source: Ambulatory Visit | Attending: Cardiology | Admitting: Cardiology

## 2012-08-16 DIAGNOSIS — I10 Essential (primary) hypertension: Secondary | ICD-10-CM

## 2012-08-16 MED ORDER — GADOBENATE DIMEGLUMINE 529 MG/ML IV SOLN
17.0000 mL | Freq: Once | INTRAVENOUS | Status: AC | PRN
  Administered 2012-08-16: 17 mL via INTRAVENOUS

## 2012-08-17 ENCOUNTER — Other Ambulatory Visit: Payer: Self-pay | Admitting: Cardiology

## 2012-08-17 DIAGNOSIS — I10 Essential (primary) hypertension: Secondary | ICD-10-CM

## 2012-08-23 ENCOUNTER — Encounter (HOSPITAL_COMMUNITY): Payer: Self-pay | Admitting: Emergency Medicine

## 2012-08-23 ENCOUNTER — Emergency Department (HOSPITAL_COMMUNITY)
Admission: EM | Admit: 2012-08-23 | Discharge: 2012-08-23 | Disposition: A | Discharge status: Home / Self Care | Payer: Medicaid Other | Attending: Emergency Medicine | Admitting: Emergency Medicine

## 2012-08-23 ENCOUNTER — Emergency Department (HOSPITAL_COMMUNITY): Payer: Medicaid Other

## 2012-08-23 ENCOUNTER — Other Ambulatory Visit: Payer: Medicaid Other

## 2012-08-23 DIAGNOSIS — Z79899 Other long term (current) drug therapy: Secondary | ICD-10-CM | POA: Insufficient documentation

## 2012-08-23 DIAGNOSIS — D487 Neoplasm of uncertain behavior of other specified sites: Secondary | ICD-10-CM | POA: Insufficient documentation

## 2012-08-23 DIAGNOSIS — M549 Dorsalgia, unspecified: Secondary | ICD-10-CM | POA: Insufficient documentation

## 2012-08-23 DIAGNOSIS — K625 Hemorrhage of anus and rectum: Secondary | ICD-10-CM

## 2012-08-23 DIAGNOSIS — J45909 Unspecified asthma, uncomplicated: Secondary | ICD-10-CM | POA: Insufficient documentation

## 2012-08-23 DIAGNOSIS — R195 Other fecal abnormalities: Secondary | ICD-10-CM | POA: Insufficient documentation

## 2012-08-23 DIAGNOSIS — R1032 Left lower quadrant pain: Secondary | ICD-10-CM | POA: Insufficient documentation

## 2012-08-23 DIAGNOSIS — R160 Hepatomegaly, not elsewhere classified: Secondary | ICD-10-CM

## 2012-08-23 LAB — URINALYSIS, ROUTINE W REFLEX MICROSCOPIC
Bilirubin Urine: NEGATIVE
Glucose, UA: NEGATIVE mg/dL
Hgb urine dipstick: NEGATIVE
Ketones, ur: NEGATIVE mg/dL
Leukocytes, UA: NEGATIVE
Nitrite: NEGATIVE
Protein, ur: NEGATIVE mg/dL
Specific Gravity, Urine: 1.011 (ref 1.005–1.030)
Urobilinogen, UA: 0.2 mg/dL (ref 0.0–1.0)
pH: 6.5 (ref 5.0–8.0)

## 2012-08-23 LAB — COMPREHENSIVE METABOLIC PANEL
ALT: 13 U/L (ref 0–35)
AST: 17 U/L (ref 0–37)
Albumin: 4.3 g/dL (ref 3.5–5.2)
Alkaline Phosphatase: 37 U/L — ABNORMAL LOW (ref 39–117)
BUN: 8 mg/dL (ref 6–23)
CO2: 25 mEq/L (ref 19–32)
Calcium: 9.3 mg/dL (ref 8.4–10.5)
Chloride: 100 mEq/L (ref 96–112)
Creatinine, Ser: 0.66 mg/dL (ref 0.50–1.10)
GFR calc Af Amer: >90 mL/min (ref >90)
GFR calc non Af Amer: >90 mL/min (ref >90)
Glucose, Bld: 91 mg/dL (ref 70–99)
Potassium: 4.1 mEq/L (ref 3.5–5.1)
Sodium: 137 mEq/L (ref 135–145)
Total Bilirubin: 0.4 mg/dL (ref 0.3–1.2)
Total Protein: 8.4 g/dL — ABNORMAL HIGH (ref 6.0–8.3)

## 2012-08-23 LAB — CBC WITH DIFFERENTIAL/PLATELET
Basophils Absolute: 0.1 10*3/uL (ref 0.0–0.1)
Basophils Relative: 1 % (ref 0–1)
Eosinophils Absolute: 0 10*3/uL (ref 0.0–0.7)
Eosinophils Relative: 0 % (ref 0–5)
HCT: 37 % (ref 36.0–46.0)
Hemoglobin: 12.8 g/dL (ref 12.0–15.0)
Lymphocytes Relative: 25 % (ref 12–46)
Lymphs Abs: 2.1 10*3/uL (ref 0.7–4.0)
MCH: 31.1 pg (ref 26.0–34.0)
MCHC: 34.6 g/dL (ref 30.0–36.0)
MCV: 89.8 fL (ref 78.0–100.0)
Monocytes Absolute: 0.4 10*3/uL (ref 0.1–1.0)
Monocytes Relative: 5 % (ref 3–12)
Neutro Abs: 5.8 10*3/uL (ref 1.7–7.7)
Neutrophils Relative %: 69 % (ref 43–77)
Platelets: 289 10*3/uL (ref 150–400)
RBC: 4.12 MIL/uL (ref 3.87–5.11)
RDW: 11.7 % (ref 11.5–15.5)
WBC: 8.4 10*3/uL (ref 4.0–10.5)

## 2012-08-23 LAB — PROTIME-INR
INR: 1.01 (ref 0.00–1.49)
Prothrombin Time: 13.2 seconds (ref 11.6–15.2)

## 2012-08-23 LAB — OCCULT BLOOD, POC DEVICE: Fecal Occult Bld: POSITIVE — AB

## 2012-08-23 MED ORDER — FENTANYL CITRATE 0.05 MG/ML IJ SOLN
25.0000 ug | Freq: Once | INTRAMUSCULAR | Status: AC
  Administered 2012-08-23: 25 ug via INTRAVENOUS
  Filled 2012-08-23: qty 2

## 2012-08-23 MED ORDER — OXYCODONE-ACETAMINOPHEN 5-325 MG PO TABS
2.0000 | ORAL_TABLET | Freq: Once | ORAL | Status: AC
  Administered 2012-08-23: 2 via ORAL
  Filled 2012-08-23: qty 2

## 2012-08-23 MED ORDER — IOHEXOL 300 MG/ML  SOLN
100.0000 mL | Freq: Once | INTRAMUSCULAR | Status: AC | PRN
  Administered 2012-08-23: 100 mL via INTRAVENOUS

## 2012-08-23 MED ORDER — OXYCODONE-ACETAMINOPHEN 5-325 MG PO TABS: 1.0000 | ORAL_TABLET | Freq: Four times a day (QID) | ORAL | Status: DC | PRN

## 2012-08-23 MED ORDER — ONDANSETRON HCL 4 MG/2ML IJ SOLN
4.0000 mg | Freq: Once | INTRAMUSCULAR | Status: AC
  Administered 2012-08-23: 4 mg via INTRAVENOUS
  Filled 2012-08-23: qty 2

## 2012-08-23 MED ORDER — IOHEXOL 300 MG/ML  SOLN
50.0000 mL | Freq: Once | INTRAMUSCULAR | Status: AC | PRN
  Administered 2012-08-23: 50 mL via ORAL

## 2012-08-23 MED ORDER — ONDANSETRON HCL 4 MG PO TABS
4.0000 mg | ORAL_TABLET | Freq: Four times a day (QID) | ORAL | Status: DC
Start: 2012-08-23 — End: 2012-08-27

## 2012-08-23 NOTE — ED Notes (Signed)
Pt with bright red blood in her stool which started today.  No prior history of bleeding. Also c/o pain in lower bilateral abdominal pain and lower back pain mostly on the left.  Pt has recent history of MRI which showed 4 tumors on the liver.  Also had colposcopy done in Jan. And it showed precancerous cells.  Ultrasound showed cyst on left ovary.

## 2012-08-23 NOTE — ED Provider Notes (Signed)
History     CSN: [id number]  Arrival date & time 08/23/12  1435   First MD Initiated Contact with Patient 08/23/12 2015      No chief complaint on file.   (Consider location/radiation/quality/duration/timing/severity/associated sxs/prior treatment) HPI  Pt to the ER with complaints of rectal bleeding that started at 11 am today. She says that it first started with her bowel movement. She describes passing around 3 cc's of blood without stool into a plastic bag. She has an "unknown bleeding disorder" which caused her to bleed after colposcopy. Unknown cause bleeding has now stopped. She was also recently diagnosed with 4 tumors on the liver by MRI and it is unknown if they are cancerous or hemangiomas.   She is also having left low back pain and left low abdominal pains. The bleeding continues  Even without using restroom and admits to having a small amount of blood in her underwear. She has never had this before. Is not having any fevers, diarrhea, vomiting, nausea or weakness.  nad vss  Past Medical History  Diagnosis Date  . Asthma   . Liver hemangioma     Past Surgical History  Procedure Date  . Cesarean section     No family history on file.  History  Substance Use Topics  . Smoking status: Never Smoker   . Smokeless tobacco: Not on file  . Alcohol Use: No    OB History    Grav Para Term Preterm Abortions TAB SAB Ect Mult Living                  Review of Systems  All other systems reviewed and are negative.    Allergies  Latex and Shellfish allergy  Home Medications   Current Outpatient Rx  Name  Route  Sig  Dispense  Refill  . ACETAMINOPHEN 500 MG PO TABS   Oral   Take 1,000 mg by mouth 2 (two) times daily as needed. For pain         . ALPRAZOLAM 0.5 MG PO TABS   Oral   Take 0.5-1 mg by mouth 2 (two) times daily as needed. For anxiety         . AMLODIPINE BESYLATE 5 MG PO TABS   Oral   Take 5 mg by mouth daily.         . B COMPLEX-C PO  TABS   Oral   Take 1 tablet by mouth daily.         Marland Kitchen VITAMIN D 1000 UNITS PO TABS   Oral   Take 1,000 Units by mouth daily.         . ETONOGESTREL-ETHINYL ESTRADIOL 0.12-0.015 MG/24HR VA RING   Vaginal   Place 1 each vaginally every 28 (twenty-eight) days. Insert vaginally and leave in place for 3 consecutive weeks, then remove for 1 week.         Marland Kitchen FERROUS SULFATE 325 (65 FE) MG PO TABS   Oral   Take 325 mg by mouth daily with breakfast.         . METOPROLOL SUCCINATE ER 50 MG PO TB24   Oral   Take 50 mg by mouth daily. Take with or immediately following a meal.         . SERTRALINE HCL 50 MG PO TABS   Oral   Take 50 mg by mouth every morning.         Marland Kitchen TEMAZEPAM 30 MG PO CAPS   Oral  Take 30 mg by mouth at bedtime as needed. For sleep         . ONDANSETRON HCL 4 MG PO TABS   Oral   Take 1 tablet (4 mg total) by mouth every 6 (six) hours.   12 tablet   0   . OXYCODONE-ACETAMINOPHEN 5-325 MG PO TABS   Oral   Take 1 tablet by mouth every 6 (six) hours as needed for pain.   15 tablet   0     BP 106/61  Pulse 82  Temp 98.3 F (36.8 C) (Oral)  Resp 18  Wt 180 lb (81.647 kg)  SpO2 100%  LMP 07/26/2012  Physical Exam  Nursing note and vitals reviewed. Constitutional: She appears well-developed and well-nourished. No distress.  HENT:  Head: Normocephalic and atraumatic.  Eyes: Pupils are equal, round, and reactive to light.  Neck: Normal range of motion. Neck supple.  Cardiovascular: Normal rate and regular rhythm.   Pulmonary/Chest: Effort normal.  Abdominal: Soft. Normal appearance and bowel sounds are normal. There is tenderness in the left lower quadrant. There is no rigidity, no rebound, no guarding, no CVA tenderness, no tenderness at McBurney's point and negative Murphy's sign.    Genitourinary: Uterus normal. Rectal exam shows no external hemorrhoid, no internal hemorrhoid, no fissure, no mass and no tenderness. Guaiac positive stool.    Neurological: She is alert.  Skin: Skin is warm and dry.    ED Course  Procedures (including critical care time)  Labs Reviewed  COMPREHENSIVE METABOLIC PANEL - Abnormal; Notable for the following:    Total Protein 8.4 (*)     Alkaline Phosphatase 37 (*)     All other components within normal limits  OCCULT BLOOD, POC DEVICE - Abnormal; Notable for the following:    Fecal Occult Bld POSITIVE (*)     All other components within normal limits  URINALYSIS, ROUTINE W REFLEX MICROSCOPIC  CBC WITH DIFFERENTIAL  PROTIME-INR   Ct Abdomen Pelvis W Contrast  08/23/2012  *RADIOLOGY REPORT*  Clinical Data: Bright red blood in stool starting today.  Left lower quadrant and lower back pain.  CT ABDOMEN AND PELVIS WITH CONTRAST  Technique:  Multidetector CT imaging of the abdomen and pelvis was performed following the standard protocol during bolus administration of intravenous contrast.  Contrast: OMNIPAQUE IOHEXOL 300 MG/ML  SOLN, 50mL OMNIPAQUE IOHEXOL 300 MG/ML  SOLN  Comparison: CT abdomen and pelvis 08/07/2011.  MRI abdomen 08/16/2012.  Findings: The lung bases are clear.  The liver demonstrates hyperdense lesions measuring 8 mm in segment seven, 2 cm in the junction of segments seven and eight, and 4 mm in segment five.  7 mm lesion in segment two.  These changes correspond with nodules demonstrated on the MRA and likely represent hemangiomas.  The spleen, gallbladder, pancreas, adrenal glands, kidneys, abdominal aorta, and retroperitoneal lymph nodes are unremarkable. The stomach and small bowel are decompressed.  Colon is stool- filled without distension.  No free air or free fluid in the abdomen.  Nodular changes in the subcutaneous fat of the right flank are stable since previous study.  Pelvis:  The uterus and adnexal structures are not enlarged.  The bladder is decompressed and the bladder wall does not appear thickened.  The no evidence of diverticulitis although scattered diverticula are  present in the sigmoid colon.  The appendix is not identified.  The colon is stool-filled and decompressed and cannot be well evaluated for mass lesions.  Normal alignment of the  lumbar spine.  No destructive bone lesions appreciated.  IMPRESSION: Multiple enhancing liver lesions likely representing hemangiomas. Stable appearance of nodular scarring in the subcutaneous fat over the right flank.  No acute process demonstrated in the abdomen or pelvis.   Original Report Authenticated By: Burman Nieves, M.D.      1. Rectal bleeding   2. Abdominal pain, acute, left lower quadrant   3. Liver masses       MDM  pts labs and CT abd pelv w contrast shows no acute findings for rectal bleed or abdominal pains. Patient is NOT actively bleeding here in the ED.  Liver masses noted, these are known.  LFTs and PT/INR are WNL.  Pt given a referral to [facility], she needs to be seen within the next 2-5 days for this.  Strict return to ED precautions given.  Pt has been advised of the symptoms that warrant their return to the ED. Patient has voiced understanding and has agreed to follow-up with the PCP or specialist.         Dorthula Matas, PA 08/23/12 2327  Dorthula Matas, PA 08/23/12 2327

## 2012-08-25 NOTE — ED Provider Notes (Signed)
Medical screening examination/treatment/procedure(s) were performed by non-physician practitioner and as supervising physician I was immediately available for consultation/collaboration.   Laray Anger, DO 08/25/12 1709

## 2012-08-27 ENCOUNTER — Other Ambulatory Visit (HOSPITAL_COMMUNITY): Payer: Self-pay | Admitting: Gastroenterology

## 2012-08-27 ENCOUNTER — Encounter (HOSPITAL_COMMUNITY): Payer: Self-pay | Admitting: *Deleted

## 2012-08-27 ENCOUNTER — Encounter (HOSPITAL_COMMUNITY): Payer: Self-pay | Admitting: Pharmacy Technician

## 2012-08-29 ENCOUNTER — Encounter (HOSPITAL_COMMUNITY): Payer: Self-pay | Admitting: *Deleted

## 2012-08-29 ENCOUNTER — Encounter (HOSPITAL_COMMUNITY): Payer: Self-pay | Admitting: Anesthesiology

## 2012-08-29 ENCOUNTER — Ambulatory Visit (HOSPITAL_COMMUNITY)
Admission: RE | Admit: 2012-08-29 | Discharge: 2012-08-29 | Disposition: A | Discharge status: Home / Self Care | Payer: Medicaid Other | Source: Ambulatory Visit | Attending: Gastroenterology | Admitting: Gastroenterology

## 2012-08-29 ENCOUNTER — Ambulatory Visit (HOSPITAL_COMMUNITY): Payer: Medicaid Other | Admitting: Anesthesiology

## 2012-08-29 ENCOUNTER — Encounter (HOSPITAL_COMMUNITY)
Admission: RE | Disposition: A | Discharge status: Home / Self Care | Payer: Self-pay | Source: Ambulatory Visit | Attending: Gastroenterology

## 2012-08-29 DIAGNOSIS — R109 Unspecified abdominal pain: Secondary | ICD-10-CM | POA: Insufficient documentation

## 2012-08-29 DIAGNOSIS — K2970 Gastritis, unspecified, without bleeding: Secondary | ICD-10-CM | POA: Insufficient documentation

## 2012-08-29 DIAGNOSIS — K921 Melena: Secondary | ICD-10-CM | POA: Insufficient documentation

## 2012-08-29 DIAGNOSIS — K648 Other hemorrhoids: Secondary | ICD-10-CM | POA: Insufficient documentation

## 2012-08-29 DIAGNOSIS — J45909 Unspecified asthma, uncomplicated: Secondary | ICD-10-CM | POA: Insufficient documentation

## 2012-08-29 DIAGNOSIS — I10 Essential (primary) hypertension: Secondary | ICD-10-CM | POA: Insufficient documentation

## 2012-08-29 HISTORY — DX: Other complications of anesthesia, initial encounter: T88.59XA

## 2012-08-29 HISTORY — DX: Anxiety disorder, unspecified: F41.9

## 2012-08-29 HISTORY — DX: Headache: R51

## 2012-08-29 HISTORY — DX: Adverse effect of unspecified anesthetic, initial encounter: T41.45XA

## 2012-08-29 HISTORY — PX: ESOPHAGOGASTRODUODENOSCOPY (EGD) WITH PROPOFOL: SHX5813

## 2012-08-29 HISTORY — DX: Anemia, unspecified: D64.9

## 2012-08-29 HISTORY — PX: COLONOSCOPY WITH PROPOFOL: SHX5780

## 2012-08-29 SURGERY — ESOPHAGOGASTRODUODENOSCOPY (EGD) WITH PROPOFOL
Anesthesia: Monitor Anesthesia Care

## 2012-08-29 MED ORDER — MIDAZOLAM HCL 5 MG/5ML IJ SOLN
INTRAMUSCULAR | Status: DC | PRN
  Administered 2012-08-29 (×2): 2 mg via INTRAVENOUS
  Administered 2012-08-29: 1 mg via INTRAVENOUS

## 2012-08-29 MED ORDER — LACTATED RINGERS IV SOLN
INTRAVENOUS | Status: DC | PRN
  Administered 2012-08-29: 09:00:00 via INTRAVENOUS

## 2012-08-29 MED ORDER — BUTAMBEN-TETRACAINE-BENZOCAINE 2-2-14 % EX AERO
INHALATION_SPRAY | CUTANEOUS | Status: DC | PRN
  Administered 2012-08-29: 2 via TOPICAL

## 2012-08-29 MED ORDER — SODIUM CHLORIDE 0.9 % IV SOLN: INTRAVENOUS | Status: DC

## 2012-08-29 MED ORDER — LACTATED RINGERS IV SOLN
INTRAVENOUS | Status: DC
  Administered 2012-08-29: 09:00:00 via INTRAVENOUS

## 2012-08-29 MED ORDER — FENTANYL CITRATE 0.05 MG/ML IJ SOLN: 25.0000 ug | INTRAMUSCULAR | Status: DC | PRN

## 2012-08-29 MED ORDER — ONDANSETRON HCL 4 MG/2ML IJ SOLN
INTRAMUSCULAR | Status: DC | PRN
  Administered 2012-08-29: 4 mg via INTRAVENOUS

## 2012-08-29 MED ORDER — PHENYLEPHRINE HCL 10 MG/ML IJ SOLN
INTRAMUSCULAR | Status: DC | PRN
  Administered 2012-08-29: 80 ug via INTRAVENOUS
  Administered 2012-08-29: 120 ug via INTRAVENOUS

## 2012-08-29 MED ORDER — PROPOFOL INFUSION 10 MG/ML OPTIME
INTRAVENOUS | Status: DC | PRN
  Administered 2012-08-29: 180 ug/kg/min via INTRAVENOUS

## 2012-08-29 MED ORDER — FENTANYL CITRATE 0.05 MG/ML IJ SOLN
INTRAMUSCULAR | Status: DC | PRN
  Administered 2012-08-29: 50 ug via INTRAVENOUS
  Administered 2012-08-29: 100 ug via INTRAVENOUS
  Administered 2012-08-29: 50 ug via INTRAVENOUS

## 2012-08-29 MED ORDER — LIDOCAINE HCL (CARDIAC) 20 MG/ML IV SOLN
INTRAVENOUS | Status: DC | PRN
  Administered 2012-08-29: 50 mg via INTRAVENOUS

## 2012-08-29 SURGICAL SUPPLY — 25 items

## 2012-08-29 NOTE — Anesthesia Preprocedure Evaluation (Addendum)
Anesthesia Evaluation  Patient identified by MRN, date of birth, ID band Patient awake    Reviewed: Allergy & Precautions, H&P , NPO status , Patient's Chart, lab work & pertinent test results, reviewed documented beta blocker date and time   Airway Mallampati: II TM Distance: >3 FB Neck ROM: full    Dental no notable dental hx. (+) Teeth Intact and Dental Advisory Given   Pulmonary neg pulmonary ROS, asthma ,  breath sounds clear to auscultation  Pulmonary exam normal       Cardiovascular Exercise Tolerance: Good hypertension, Pt. on medications and Pt. on home beta blockers negative cardio ROS  Rhythm:regular Rate:Normal     Neuro/Psych Anxiety panic attacksnegative neurological ROS  negative psych ROS   GI/Hepatic negative GI ROS, Neg liver ROS,   Endo/Other  negative endocrine ROS  Renal/GU negative Renal ROS  negative genitourinary   Musculoskeletal   Abdominal   Peds  Hematology negative hematology ROS (+)   Anesthesia Other Findings   Reproductive/Obstetrics negative OB ROS                          Anesthesia Physical Anesthesia Plan  ASA: II  Anesthesia Plan: MAC   Post-op Pain Management:    Induction: Intravenous  Airway Management Planned:   Additional Equipment:   Intra-op Plan:   Post-operative Plan:   Informed Consent: I have reviewed the patients History and Physical, chart, labs and discussed the procedure including the risks, benefits and alternatives for the proposed anesthesia with the patient or authorized representative who has indicated his/her understanding and acceptance.   Dental Advisory Given  Plan Discussed with: CRNA and Surgeon  Anesthesia Plan Comments:        Anesthesia Quick Evaluation

## 2012-08-29 NOTE — Anesthesia Postprocedure Evaluation (Signed)
  Anesthesia Post-op Note  Patient: Victoria Bates  Procedure(s) Performed: Procedure(s) (LRB): ESOPHAGOGASTRODUODENOSCOPY (EGD) WITH PROPOFOL (N/A) COLONOSCOPY WITH PROPOFOL (N/A)  Patient Location: PACU  Anesthesia Type: MAC  Level of Consciousness: awake and alert   Airway and Oxygen Therapy: Patient Spontanous Breathing  Post-op Pain: mild  Post-op Assessment: Post-op Vital signs reviewed, Patient's Cardiovascular Status Stable, Respiratory Function Stable, Patent Airway and No signs of Nausea or vomiting  Last Vitals:  Filed Vitals:   08/29/12 1100  BP: 127/63  Temp:   Resp: 27    Post-op Vital Signs: stable   Complications: No apparent anesthesia complications

## 2012-08-29 NOTE — Op Note (Signed)
[facility] [address] [place], [address]   ENDOSCOPY PROCEDURE REPORT  PATIENT: Victoria Bates, Victoria Bates  MR#: [id number] BIRTHDATE: November 08, 1980 , 31  yrs. old GENDER: Female ENDOSCOPIST: Willis Modena, MD REFERRED BY:  Kirby Funk, M.D. PROCEDURE DATE:  08/29/2012 PROCEDURE:  EGD, diagnostic ASA CLASS:     Class II INDICATIONS:  abdominal pain, blood in stool. MEDICATIONS: MAC sedation, administered by CRNA TOPICAL ANESTHETIC: Cetacaine Spray  DESCRIPTION OF PROCEDURE: After the risks benefits and alternatives of the procedure were thoroughly explained, informed consent was obtained.  The Pentax Gastroscope [id number] endoscope was introduced through the mouth and advanced to the second portion of the duodenum. Without limitations.  The instrument was slowly withdrawn as the mucosa was fully examined.    Findings: [Normal esophagus.  Mild gastritis, otherwise normal stomach and pylorus.  Normal duodenum to the second portion.          The scope was then withdrawn from the patient and the procedure completed.  ENDOSCOPIC IMPRESSION:     As above.  Mild gastritis, otherwise normal.  No explanation for blood in stool or abdominal pain.   RECOMMENDATIONS:     1.  Watch for potential complications of procedure. 2.  Proceed with colonoscopy.  eSigned:  Willis Modena, MD 08/29/2012 10:42 AM   CC:

## 2012-08-29 NOTE — Addendum Note (Signed)
Addendum created 08/29/12 1258 by Illene Silver, CRNA   Modules edited: Anesthesia Flowsheet

## 2012-08-29 NOTE — Op Note (Addendum)
[facility] [address] [place], [address]   COLONOSCOPY PROCEDURE REPORT  PATIENT: Victoria Bates, Victoria Bates  MR#: [id number] BIRTHDATE: 1980-11-22 , 31  yrs. old GENDER: Female ENDOSCOPIST: Willis Modena, MD REFERRED XB:JYNW Valentina Lucks, MD PROCEDURE DATE:  08/29/2012 PROCEDURE:   Colonoscopy, diagnostic ASA CLASS:   ASA-II INDICATIONS:Abdominal pain, blood in stool MEDICATIONS: MAC anesthesia per CRNA  DESCRIPTION OF PROCEDURE:   After the risks benefits and alternatives of the procedure were thoroughly explained, informed consent was obtained.       The Pentax Ped Colon [id number]  endoscope was introduced through the anus and advanced to the   . No adverse events experienced.   The quality of the prep was     The instrument was then slowly withdrawn as the colon was fully examined.    Findings: Digital rectal exam normal.  Prep quality was good.  Very mild distal rectal erythema, suspect prep artifact.  No polyps, masses, vascular ectasias, or inflammatory changes.  Normal distal 10cm of the terminal ileum.   Mild internal hemorrhoids, otherwise retroflexed view of rectum was normal.       Withdrawal time was over 10 minutes.  The scope was withdrawn and the procedure completed.  ENDOSCOPIC IMPRESSION:     As above.  Perhaps some of patient's hematochezia is from hemorrhoids.  No explanation for melena seen.  RECOMMENDATIONS:     1.  Watch for potential complications of procedure. 2.  Topical therapies (e.g., Preparation-H) as needed for blood in stool. 3.   Follow-up with [facility] in 4-6 weeks. 4.  Repeat colonoscopy for screening at age 42.  eSigned:  Willis Modena, MD 08/29/2012 10:51 AM Revised: 08/29/2012 10:51 AM cc:

## 2012-08-29 NOTE — Transfer of Care (Signed)
Immediate Anesthesia Transfer of Care Note  Patient: Victoria Bates  Procedure(s) Performed: Procedure(s): ESOPHAGOGASTRODUODENOSCOPY (EGD) WITH PROPOFOL (N/A) COLONOSCOPY WITH PROPOFOL (N/A)  Patient Location: PACU  Anesthesia Type:MAC  Level of Consciousness: awake, alert , oriented and patient cooperative  Airway & Oxygen Therapy: Patient Spontanous Breathing and Patient connected to nasal cannula oxygen  Post-op Assessment: Report given to PACU RN, Post -op Vital signs reviewed and stable and Patient moving all extremities X 4  Post vital signs: stable  Complications: No apparent anesthesia complications

## 2012-08-29 NOTE — H&P (Signed)
Patient interval history reviewed.  Patient examined again.  There has been no change from documented H/P dated 08/27/12 (scanned into chart from our office) except as documented above.  Assessment:  1.  Abdominal pain. 2.  Blood in stool (melena and hematochezia).  Plan:  1.  Endoscopy. 2.  Colonoscopy. 3.  Risks (bleeding, infection, bowel perforation that could require surgery, sedation-related changes in cardiopulmonary systems), benefits (identification and possible treatment of source of symptoms, exclusion of certain causes of symptoms), and alternatives (watchful waiting, radiographic imaging studies, empiric medical treatment) of upper endoscopy (EGD) were explained to patient in detail and patient wishes to proceed. 4.  Risks (bleeding, infection, bowel perforation that could require surgery, sedation-related changes in cardiopulmonary systems), benefits (identification and possible treatment of source of symptoms, exclusion of certain causes of symptoms), and alternatives (watchful waiting, radiographic imaging studies, empiric medical treatment) of colonoscopy were explained to patient in detail and patient wishes to proceed.

## 2012-09-01 ENCOUNTER — Encounter (HOSPITAL_COMMUNITY): Payer: Self-pay | Admitting: Gastroenterology

## 2012-09-03 ENCOUNTER — Other Ambulatory Visit: Payer: Self-pay | Admitting: Gastroenterology

## 2012-09-03 DIAGNOSIS — R112 Nausea with vomiting, unspecified: Secondary | ICD-10-CM

## 2012-09-06 ENCOUNTER — Ambulatory Visit
Admission: RE | Admit: 2012-09-06 | Discharge: 2012-09-06 | Disposition: A | Discharge status: Home / Self Care | Payer: Medicaid Other | Source: Ambulatory Visit | Attending: Gastroenterology | Admitting: Gastroenterology

## 2012-09-06 DIAGNOSIS — R112 Nausea with vomiting, unspecified: Secondary | ICD-10-CM

## 2012-09-12 ENCOUNTER — Encounter (HOSPITAL_COMMUNITY): Payer: Self-pay

## 2012-09-20 ENCOUNTER — Encounter (HOSPITAL_COMMUNITY): Payer: Self-pay

## 2012-09-20 ENCOUNTER — Encounter (HOSPITAL_COMMUNITY)
Admission: RE | Admit: 2012-09-20 | Discharge: 2012-09-20 | Disposition: A | Discharge status: Home / Self Care | Payer: Medicaid Other | Source: Ambulatory Visit | Attending: Obstetrics and Gynecology | Admitting: Obstetrics and Gynecology

## 2012-09-20 LAB — CBC
HCT: 37.8 % (ref 36.0–46.0)
Hemoglobin: 12.8 g/dL (ref 12.0–15.0)
MCH: 30.7 pg (ref 26.0–34.0)
MCHC: 33.9 g/dL (ref 30.0–36.0)
MCV: 90.6 fL (ref 78.0–100.0)
Platelets: 250 10*3/uL (ref 150–400)
RBC: 4.17 MIL/uL (ref 3.87–5.11)
RDW: 12 % (ref 11.5–15.5)
WBC: 6.5 10*3/uL (ref 4.0–10.5)

## 2012-09-20 LAB — SURGICAL PCR SCREEN
MRSA, PCR: NEGATIVE
Staphylococcus aureus: POSITIVE — AB

## 2012-09-20 LAB — BASIC METABOLIC PANEL
BUN: 10 mg/dL (ref 6–23)
CO2: 26 mEq/L (ref 19–32)
Calcium: 9.5 mg/dL (ref 8.4–10.5)
Chloride: 101 mEq/L (ref 96–112)
Creatinine, Ser: 0.67 mg/dL (ref 0.50–1.10)
GFR calc Af Amer: >90 mL/min (ref >90)
GFR calc non Af Amer: >90 mL/min (ref >90)
Glucose, Bld: 80 mg/dL (ref 70–99)
Potassium: 4.2 mEq/L (ref 3.5–5.1)
Sodium: 137 mEq/L (ref 135–145)

## 2012-09-20 NOTE — Patient Instructions (Addendum)
Your procedure is scheduled on:09/25/12  Enter through the Main Entrance at :11am Pick up desk phone and dial [phone] and inform us of your arrival.  Please call [phone] if you have any problems the morning of surgery.  Remember: Do not eat after midnight: Monday Clear liquids ok until 0830 am Tuesday   Take these meds the morning of surgery with a sip of water: BP meds, Sertraline, Xanax  DO NOT wear jewelry, eye make-up, lipstick,body lotion, or dark fingernail polish. Do not shave for 48 hours prior to surgery.  If you are to be admitted after surgery, leave suitcase in car until your room has been assigned. Patients discharged on the day of surgery will not be allowed to drive home.

## 2012-09-25 ENCOUNTER — Encounter (HOSPITAL_COMMUNITY): Payer: Self-pay | Admitting: Anesthesiology

## 2012-09-25 ENCOUNTER — Encounter (HOSPITAL_COMMUNITY)
Admission: RE | Disposition: A | Discharge status: Home / Self Care | Payer: Self-pay | Source: Ambulatory Visit | Attending: Obstetrics and Gynecology

## 2012-09-25 ENCOUNTER — Ambulatory Visit (HOSPITAL_COMMUNITY): Payer: Medicaid Other | Admitting: Anesthesiology

## 2012-09-25 ENCOUNTER — Other Ambulatory Visit: Payer: Self-pay | Admitting: Obstetrics and Gynecology

## 2012-09-25 ENCOUNTER — Ambulatory Visit (HOSPITAL_COMMUNITY)
Admission: RE | Admit: 2012-09-25 | Discharge: 2012-09-26 | Disposition: A | Discharge status: Home / Self Care | Payer: Medicaid Other | Source: Ambulatory Visit | Attending: Obstetrics and Gynecology | Admitting: Obstetrics and Gynecology

## 2012-09-25 DIAGNOSIS — N949 Unspecified condition associated with female genital organs and menstrual cycle: Principal | ICD-10-CM | POA: Insufficient documentation

## 2012-09-25 DIAGNOSIS — F411 Generalized anxiety disorder: Secondary | ICD-10-CM | POA: Insufficient documentation

## 2012-09-25 DIAGNOSIS — Z90710 Acquired absence of both cervix and uterus: Secondary | ICD-10-CM

## 2012-09-25 DIAGNOSIS — N870 Mild cervical dysplasia: Secondary | ICD-10-CM | POA: Insufficient documentation

## 2012-09-25 DIAGNOSIS — N938 Other specified abnormal uterine and vaginal bleeding: Principal | ICD-10-CM | POA: Insufficient documentation

## 2012-09-25 DIAGNOSIS — I10 Essential (primary) hypertension: Secondary | ICD-10-CM | POA: Insufficient documentation

## 2012-09-25 HISTORY — PX: ROBOTIC ASSISTED TOTAL HYSTERECTOMY WITH BILATERAL SALPINGO OOPHERECTOMY: SHX6086

## 2012-09-25 LAB — TYPE AND SCREEN
ABO/RH(D): A NEG
Antibody Screen: NEGATIVE

## 2012-09-25 LAB — PREGNANCY, URINE: Preg Test, Ur: NEGATIVE

## 2012-09-25 SURGERY — ROBOTIC ASSISTED TOTAL HYSTERECTOMY WITH BILATERAL SALPINGO OOPHORECTOMY
Anesthesia: General | Site: Abdomen | Laterality: Bilateral | Wound class: Clean Contaminated

## 2012-09-25 MED ORDER — SCOPOLAMINE 1 MG/3DAYS TD PT72
MEDICATED_PATCH | TRANSDERMAL | Status: AC
  Administered 2012-09-25: 1.5 mg via TRANSDERMAL
  Filled 2012-09-25: qty 1

## 2012-09-25 MED ORDER — ONDANSETRON HCL 4 MG/2ML IJ SOLN: 4.0000 mg | Freq: Four times a day (QID) | INTRAMUSCULAR | Status: DC | PRN

## 2012-09-25 MED ORDER — GLYCOPYRROLATE 0.2 MG/ML IJ SOLN
INTRAMUSCULAR | Status: DC | PRN
  Administered 2012-09-25: 0.4 mg via INTRAVENOUS

## 2012-09-25 MED ORDER — HYDROMORPHONE HCL PF 1 MG/ML IJ SOLN
0.2500 mg | INTRAMUSCULAR | Status: DC | PRN
  Administered 2012-09-25 (×2): 0.5 mg via INTRAVENOUS

## 2012-09-25 MED ORDER — SUFENTANIL CITRATE 50 MCG/ML IV SOLN
INTRAVENOUS | Status: DC | PRN
  Administered 2012-09-25: 10 ug via INTRAVENOUS
  Administered 2012-09-25 (×2): 20 ug via INTRAVENOUS

## 2012-09-25 MED ORDER — IBUPROFEN 800 MG PO TABS: 800.0000 mg | ORAL_TABLET | Freq: Three times a day (TID) | ORAL | Status: DC | PRN

## 2012-09-25 MED ORDER — LACTATED RINGERS IV SOLN
INTRAVENOUS | Status: DC
  Administered 2012-09-25: 20:00:00 via INTRAVENOUS

## 2012-09-25 MED ORDER — KETOROLAC TROMETHAMINE 30 MG/ML IJ SOLN
INTRAMUSCULAR | Status: DC | PRN
  Administered 2012-09-25: 30 mg via INTRAVENOUS

## 2012-09-25 MED ORDER — HYDROMORPHONE HCL PF 1 MG/ML IJ SOLN
INTRAMUSCULAR | Status: AC
  Administered 2012-09-25: 0.5 mg via INTRAVENOUS
  Filled 2012-09-25: qty 1

## 2012-09-25 MED ORDER — SODIUM CHLORIDE 0.9 % IJ SOLN
INTRAMUSCULAR | Status: DC | PRN
  Administered 2012-09-25: 10 mL via INTRAVENOUS

## 2012-09-25 MED ORDER — METHYLENE BLUE 1 % INJ SOLN
INTRAMUSCULAR | Status: DC | PRN
  Administered 2012-09-25: 1 mL via SUBMUCOSAL

## 2012-09-25 MED ORDER — CEFAZOLIN SODIUM-DEXTROSE 2-3 GM-% IV SOLR
2.0000 g | INTRAVENOUS | Status: AC
  Administered 2012-09-25: 2 g via INTRAVENOUS

## 2012-09-25 MED ORDER — METOPROLOL SUCCINATE ER 25 MG PO TB24
25.0000 mg | ORAL_TABLET | Freq: Every day | ORAL | Status: DC
  Administered 2012-09-26: 25 mg via ORAL
  Filled 2012-09-25 (×2): qty 1

## 2012-09-25 MED ORDER — MEPERIDINE HCL 25 MG/ML IJ SOLN: 6.2500 mg | INTRAMUSCULAR | Status: DC | PRN

## 2012-09-25 MED ORDER — DEXAMETHASONE SODIUM PHOSPHATE 10 MG/ML IJ SOLN
INTRAMUSCULAR | Status: DC | PRN
  Administered 2012-09-25: 10 mg via INTRAVENOUS

## 2012-09-25 MED ORDER — DIPHENHYDRAMINE HCL 12.5 MG/5ML PO ELIX
12.5000 mg | ORAL_SOLUTION | Freq: Four times a day (QID) | ORAL | Status: DC | PRN
  Filled 2012-09-25: qty 5

## 2012-09-25 MED ORDER — PROPOFOL 10 MG/ML IV EMUL
INTRAVENOUS | Status: AC
  Filled 2012-09-25: qty 40

## 2012-09-25 MED ORDER — ROPIVACAINE HCL 5 MG/ML IJ SOLN
INTRAMUSCULAR | Status: DC | PRN
  Administered 2012-09-25: 30 mL via EPIDURAL

## 2012-09-25 MED ORDER — EPHEDRINE 5 MG/ML INJ
INTRAVENOUS | Status: AC
  Filled 2012-09-25: qty 10

## 2012-09-25 MED ORDER — SIMETHICONE 80 MG PO CHEW: 80.0000 mg | CHEWABLE_TABLET | Freq: Four times a day (QID) | ORAL | Status: DC | PRN

## 2012-09-25 MED ORDER — PROPOFOL 10 MG/ML IV BOLUS
INTRAVENOUS | Status: DC | PRN
  Administered 2012-09-25: 200 mg via INTRAVENOUS

## 2012-09-25 MED ORDER — KETOROLAC TROMETHAMINE 30 MG/ML IJ SOLN: 30.0000 mg | Freq: Four times a day (QID) | INTRAMUSCULAR | Status: DC

## 2012-09-25 MED ORDER — LABETALOL HCL 5 MG/ML IV SOLN
10.0000 mg | INTRAVENOUS | Status: DC | PRN
  Administered 2012-09-26: 10 mg via INTRAVENOUS
  Filled 2012-09-25: qty 4

## 2012-09-25 MED ORDER — GLYCOPYRROLATE 0.2 MG/ML IJ SOLN
INTRAMUSCULAR | Status: AC
  Filled 2012-09-25: qty 2

## 2012-09-25 MED ORDER — FENTANYL CITRATE 0.05 MG/ML IJ SOLN
INTRAMUSCULAR | Status: AC
  Administered 2012-09-25: 50 ug via INTRAVENOUS
  Filled 2012-09-25: qty 2

## 2012-09-25 MED ORDER — HYDROMORPHONE 0.3 MG/ML IV SOLN
INTRAVENOUS | Status: DC
  Administered 2012-09-25: 1.8 mg via INTRAVENOUS
  Administered 2012-09-25: 19:00:00 via INTRAVENOUS
  Administered 2012-09-26: 1.8 mg via INTRAVENOUS
  Filled 2012-09-25: qty 25

## 2012-09-25 MED ORDER — SODIUM CHLORIDE 0.9 % IJ SOLN
INTRAMUSCULAR | Status: DC | PRN
  Administered 2012-09-25: 50 mL via INTRAVENOUS

## 2012-09-25 MED ORDER — MIDAZOLAM HCL 5 MG/5ML IJ SOLN
INTRAMUSCULAR | Status: DC | PRN
  Administered 2012-09-25: 2 mg via INTRAVENOUS

## 2012-09-25 MED ORDER — ROPIVACAINE HCL 5 MG/ML IJ SOLN
INTRAMUSCULAR | Status: AC
  Filled 2012-09-25: qty 60

## 2012-09-25 MED ORDER — SODIUM CHLORIDE 0.9 % IJ SOLN: 9.0000 mL | INTRAMUSCULAR | Status: DC | PRN

## 2012-09-25 MED ORDER — ARTIFICIAL TEARS OP OINT
TOPICAL_OINTMENT | OPHTHALMIC | Status: DC | PRN
  Administered 2012-09-25: 1 via OPHTHALMIC

## 2012-09-25 MED ORDER — ROCURONIUM BROMIDE 100 MG/10ML IV SOLN
INTRAVENOUS | Status: DC | PRN
  Administered 2012-09-25: 10 mg via INTRAVENOUS
  Administered 2012-09-25: 20 mg via INTRAVENOUS
  Administered 2012-09-25 (×2): 10 mg via INTRAVENOUS
  Administered 2012-09-25: 50 mg via INTRAVENOUS

## 2012-09-25 MED ORDER — ACETAMINOPHEN 10 MG/ML IV SOLN
INTRAVENOUS | Status: AC
  Administered 2012-09-25: 1000 mg via INTRAVENOUS
  Filled 2012-09-25: qty 100

## 2012-09-25 MED ORDER — ONDANSETRON HCL 4 MG/2ML IJ SOLN
INTRAMUSCULAR | Status: DC | PRN
  Administered 2012-09-25: 4 mg via INTRAVENOUS

## 2012-09-25 MED ORDER — ALBUTEROL SULFATE HFA 108 (90 BASE) MCG/ACT IN AERS
2.0000 | INHALATION_SPRAY | Freq: Four times a day (QID) | RESPIRATORY_TRACT | Status: DC | PRN
  Filled 2012-09-25: qty 6.7

## 2012-09-25 MED ORDER — ALPRAZOLAM 0.5 MG PO TABS
1.0000 mg | ORAL_TABLET | Freq: Three times a day (TID) | ORAL | Status: DC | PRN
  Administered 2012-09-26: 1 mg via ORAL
  Filled 2012-09-25: qty 2

## 2012-09-25 MED ORDER — ALUM & MAG HYDROXIDE-SIMETH 200-200-20 MG/5ML PO SUSP: 30.0000 mL | ORAL | Status: DC | PRN

## 2012-09-25 MED ORDER — ONDANSETRON HCL 4 MG PO TABS: 4.0000 mg | ORAL_TABLET | Freq: Four times a day (QID) | ORAL | Status: DC | PRN

## 2012-09-25 MED ORDER — DEXAMETHASONE SODIUM PHOSPHATE 10 MG/ML IJ SOLN
INTRAMUSCULAR | Status: AC
  Filled 2012-09-25: qty 1

## 2012-09-25 MED ORDER — LIDOCAINE HCL (CARDIAC) 20 MG/ML IV SOLN
INTRAVENOUS | Status: DC | PRN
  Administered 2012-09-25: 100 mg via INTRAVENOUS

## 2012-09-25 MED ORDER — NEOSTIGMINE METHYLSULFATE 1 MG/ML IJ SOLN
INTRAMUSCULAR | Status: DC | PRN
  Administered 2012-09-25: 2 mg via INTRAVENOUS

## 2012-09-25 MED ORDER — NALOXONE HCL 0.4 MG/ML IJ SOLN: 0.4000 mg | INTRAMUSCULAR | Status: DC | PRN

## 2012-09-25 MED ORDER — FENTANYL CITRATE 0.05 MG/ML IJ SOLN
INTRAMUSCULAR | Status: DC | PRN
  Administered 2012-09-25 (×2): 50 ug via INTRAVENOUS

## 2012-09-25 MED ORDER — ONDANSETRON HCL 4 MG/2ML IJ SOLN
INTRAMUSCULAR | Status: AC
  Filled 2012-09-25: qty 2

## 2012-09-25 MED ORDER — KETOROLAC TROMETHAMINE 30 MG/ML IJ SOLN
30.0000 mg | Freq: Four times a day (QID) | INTRAMUSCULAR | Status: DC
  Administered 2012-09-25 – 2012-09-26 (×2): 30 mg via INTRAVENOUS
  Filled 2012-09-25 (×2): qty 1

## 2012-09-25 MED ORDER — SCOPOLAMINE 1 MG/3DAYS TD PT72: 1.0000 | MEDICATED_PATCH | TRANSDERMAL | Status: DC

## 2012-09-25 MED ORDER — LACTATED RINGERS IV SOLN
INTRAVENOUS | Status: DC
  Administered 2012-09-25 (×3): via INTRAVENOUS

## 2012-09-25 MED ORDER — KETOROLAC TROMETHAMINE 30 MG/ML IJ SOLN
INTRAMUSCULAR | Status: AC
  Filled 2012-09-25: qty 1

## 2012-09-25 MED ORDER — SUFENTANIL CITRATE 50 MCG/ML IV SOLN
INTRAVENOUS | Status: AC
  Filled 2012-09-25: qty 1

## 2012-09-25 MED ORDER — AMLODIPINE BESYLATE 5 MG PO TABS
5.0000 mg | ORAL_TABLET | Freq: Every morning | ORAL | Status: DC
  Administered 2012-09-26: 5 mg via ORAL
  Filled 2012-09-25 (×2): qty 1

## 2012-09-25 MED ORDER — NEOSTIGMINE METHYLSULFATE 1 MG/ML IJ SOLN
INTRAMUSCULAR | Status: AC
  Filled 2012-09-25: qty 1

## 2012-09-25 MED ORDER — LIDOCAINE HCL (CARDIAC) 20 MG/ML IV SOLN
INTRAVENOUS | Status: AC
  Filled 2012-09-25: qty 5

## 2012-09-25 MED ORDER — HYDROMORPHONE HCL PF 1 MG/ML IJ SOLN
INTRAMUSCULAR | Status: AC
  Filled 2012-09-25: qty 1

## 2012-09-25 MED ORDER — MIDAZOLAM HCL 2 MG/2ML IJ SOLN
INTRAMUSCULAR | Status: AC
  Filled 2012-09-25: qty 2

## 2012-09-25 MED ORDER — EPHEDRINE SULFATE 50 MG/ML IJ SOLN
INTRAMUSCULAR | Status: DC | PRN
  Administered 2012-09-25: 10 mg via INTRAVENOUS

## 2012-09-25 MED ORDER — ROCURONIUM BROMIDE 50 MG/5ML IV SOLN
INTRAVENOUS | Status: AC
  Filled 2012-09-25: qty 1

## 2012-09-25 MED ORDER — STERILE WATER FOR IRRIGATION IR SOLN
Status: DC | PRN
  Administered 2012-09-25: 1000 mL

## 2012-09-25 MED ORDER — FENTANYL CITRATE 0.05 MG/ML IJ SOLN: 25.0000 ug | INTRAMUSCULAR | Status: DC | PRN

## 2012-09-25 MED ORDER — OXYCODONE-ACETAMINOPHEN 5-325 MG PO TABS
1.0000 | ORAL_TABLET | ORAL | Status: DC | PRN
  Administered 2012-09-26 (×2): 2 via ORAL
  Filled 2012-09-25 (×2): qty 2

## 2012-09-25 MED ORDER — METOCLOPRAMIDE HCL 5 MG/ML IJ SOLN: 10.0000 mg | Freq: Once | INTRAMUSCULAR | Status: DC | PRN

## 2012-09-25 MED ORDER — SERTRALINE HCL 50 MG PO TABS
50.0000 mg | ORAL_TABLET | Freq: Every morning | ORAL | Status: DC
  Administered 2012-09-26: 50 mg via ORAL
  Filled 2012-09-25 (×2): qty 1

## 2012-09-25 MED ORDER — ACETAMINOPHEN 10 MG/ML IV SOLN: 1000.0000 mg | Freq: Once | INTRAVENOUS | Status: AC

## 2012-09-25 MED ORDER — LACTATED RINGERS IR SOLN
Status: DC | PRN
  Administered 2012-09-25: 3000 mL

## 2012-09-25 MED ORDER — DIPHENHYDRAMINE HCL 50 MG/ML IJ SOLN: 12.5000 mg | Freq: Four times a day (QID) | INTRAMUSCULAR | Status: DC | PRN

## 2012-09-25 MED ORDER — CEFAZOLIN SODIUM-DEXTROSE 2-3 GM-% IV SOLR
INTRAVENOUS | Status: AC
  Filled 2012-09-25: qty 50

## 2012-09-25 SURGICAL SUPPLY — 69 items
APL SKNCLS STERI-STRIP NONHPOA (GAUZE/BANDAGES/DRESSINGS) ×1
BAG URINE DRAINAGE (UROLOGICAL SUPPLIES) ×2 IMPLANT
BARRIER ADHS 3X4 INTERCEED (GAUZE/BANDAGES/DRESSINGS) ×2 IMPLANT
BENZOIN TINCTURE PRP APPL 2/3 (GAUZE/BANDAGES/DRESSINGS) ×2 IMPLANT
BRR ADH 4X3 ABS CNTRL BYND (GAUZE/BANDAGES/DRESSINGS) ×1
CATH FOLEY 3WAY  5CC 16FR (CATHETERS) ×1
CATH FOLEY 3WAY 5CC 16FR (CATHETERS) ×1 IMPLANT
CONT PATH 16OZ SNAP LID 3702 (MISCELLANEOUS) ×2 IMPLANT
COVER MAYO STAND STRL (DRAPES) ×2 IMPLANT
COVER TABLE BACK 60X90 (DRAPES) ×4 IMPLANT
COVER TIP SHEARS 8 DVNC (MISCELLANEOUS) ×1 IMPLANT
COVER TIP SHEARS 8MM DA VINCI (MISCELLANEOUS) ×1
DECANTER SPIKE VIAL GLASS SM (MISCELLANEOUS) ×2 IMPLANT
DILATOR CANAL MILEX (MISCELLANEOUS) ×2 IMPLANT
DRAPE HUG U DISPOSABLE (DRAPE) ×2 IMPLANT
DRAPE LG THREE QUARTER DISP (DRAPES) ×4 IMPLANT
DRAPE WARM FLUID 44X44 (DRAPE) ×2 IMPLANT
ELECT REM PT RETURN 9FT ADLT (ELECTROSURGICAL) ×2
ELECTRODE REM PT RTRN 9FT ADLT (ELECTROSURGICAL) ×1 IMPLANT
EVACUATOR SMOKE 8.L (FILTER) ×2 IMPLANT
GAUZE VASELINE 3X9 (GAUZE/BANDAGES/DRESSINGS) IMPLANT
GLOVE BIOGEL M 6.5 STRL (GLOVE) ×3 IMPLANT
GLOVE BIOGEL PI IND STRL 6.5 (GLOVE) ×1 IMPLANT
GLOVE BIOGEL PI IND STRL 7.0 (GLOVE) ×2 IMPLANT
GLOVE BIOGEL PI INDICATOR 6.5 (GLOVE) ×4
GLOVE BIOGEL PI INDICATOR 7.0 (GLOVE) ×4
GLOVE SKINSENSE NS SZ7.5 (GLOVE) ×5
GLOVE SKINSENSE STRL SZ7.5 (GLOVE) IMPLANT
GOWN STRL REIN XL XLG (GOWN DISPOSABLE) ×12 IMPLANT
GYRUS RUMI II 2.5CM BLUE (DISPOSABLE)
GYRUS RUMI II 3.5CM BLUE (DISPOSABLE)
GYRUS RUMI II 4.0CM BLUE (DISPOSABLE)
KIT ACCESSORY DA VINCI DISP (KITS) ×1
KIT ACCESSORY DVNC DISP (KITS) ×1 IMPLANT
LEGGING LITHOTOMY PAIR STRL (DRAPES) ×2 IMPLANT
OCCLUDER COLPOPNEUMO (BALLOONS) ×2 IMPLANT
PACK LAVH (CUSTOM PROCEDURE TRAY) ×2 IMPLANT
PAD PREP 24X48 CUFFED NSTRL (MISCELLANEOUS) ×4 IMPLANT
PLUG CATH AND CAP STER (CATHETERS) ×2 IMPLANT
PROTECTOR NERVE ULNAR (MISCELLANEOUS) ×4 IMPLANT
RUMI II 3.0CM BLUE KOH-EFFICIE (DISPOSABLE) IMPLANT
RUMI II GYRUS 2.5CM BLUE (DISPOSABLE) IMPLANT
RUMI II GYRUS 3.5CM BLUE (DISPOSABLE) IMPLANT
RUMI II GYRUS 4.0CM BLUE (DISPOSABLE) IMPLANT
SET CYSTO W/LG BORE CLAMP LF (SET/KITS/TRAYS/PACK) ×2 IMPLANT
SET IRRIG TUBING LAPAROSCOPIC (IRRIGATION / IRRIGATOR) ×2 IMPLANT
SOLUTION ELECTROLUBE (MISCELLANEOUS) ×2 IMPLANT
STRIP CLOSURE SKIN 1/4X4 (GAUZE/BANDAGES/DRESSINGS) ×2 IMPLANT
SUT VIC AB 0 CT1 27 (SUTURE) ×4
SUT VIC AB 0 CT1 27XBRD ANBCTR (SUTURE) ×2 IMPLANT
SUT VIC AB 0 CT1 27XBRD ANTBC (SUTURE) IMPLANT
SUT VICRYL 0 27 CT2 27 ABS (SUTURE) ×11 IMPLANT
SUT VICRYL 0 UR6 27IN ABS (SUTURE) ×2 IMPLANT
SUT VICRYL RAPIDE 4/0 PS 2 (SUTURE) ×7 IMPLANT
SYR 50ML LL SCALE MARK (SYRINGE) ×5 IMPLANT
TIP RUMI ORANGE 6.7MMX12CM (TIP) IMPLANT
TIP UTERINE 5.1X6CM LAV DISP (MISCELLANEOUS) IMPLANT
TIP UTERINE 6.7X10CM GRN DISP (MISCELLANEOUS) IMPLANT
TIP UTERINE 6.7X6CM WHT DISP (MISCELLANEOUS) IMPLANT
TIP UTERINE 6.7X8CM BLUE DISP (MISCELLANEOUS) ×2 IMPLANT
TOWEL OR 17X24 6PK STRL BLUE (TOWEL DISPOSABLE) ×6 IMPLANT
TROCAR 12M 150ML BLUNT (TROCAR) ×2 IMPLANT
TROCAR DISP BLADELESS 8 DVNC (TROCAR) ×1 IMPLANT
TROCAR DISP BLADELESS 8MM (TROCAR) ×1
TROCAR XCEL 12X100 BLDLESS (ENDOMECHANICALS) IMPLANT
TROCAR XCEL NON-BLD 11X100MML (ENDOMECHANICALS) ×2 IMPLANT
TUBING FILTER THERMOFLATOR (ELECTROSURGICAL) ×2 IMPLANT
WARMER LAPAROSCOPE (MISCELLANEOUS) ×2 IMPLANT
WATER STERILE IRR 1000ML POUR (IV SOLUTION) ×6 IMPLANT

## 2012-09-25 NOTE — H&P (Signed)
Date of Initial H&P: 09/19/2012 History reviewed, patient examined, no change in status, stable for surgery.

## 2012-09-25 NOTE — Transfer of Care (Signed)
Immediate Anesthesia Transfer of Care Note  Patient: Victoria Bates  Procedure(s) Performed: Procedure(s): ROBOTIC ASSISTED TOTAL HYSTERECTOMY WITH BILATERAL SALPINGO OOPHORECTOMY (Bilateral)  Patient Location: PACU  Anesthesia Type:General  Level of Consciousness: awake  Airway & Oxygen Therapy: Patient Spontanous Breathing and Patient connected to nasal cannula oxygen  Post-op Assessment: Report given to PACU RN and Post -op Vital signs reviewed and stable  Post vital signs: stable  Complications: No apparent anesthesia complications

## 2012-09-25 NOTE — Anesthesia Preprocedure Evaluation (Addendum)
Anesthesia Evaluation  Patient identified by MRN, date of birth, ID band Patient awake    Reviewed: Allergy & Precautions, H&P , NPO status , Patient's Chart, lab work & pertinent test results, reviewed documented beta blocker date and time   Airway Mallampati: I TM Distance: >3 FB Neck ROM: Full    Dental no notable dental hx. (+) Teeth Intact   Pulmonary asthma ,  breath sounds clear to auscultation  Pulmonary exam normal       Cardiovascular hypertension, Pt. on medications and Pt. on home beta blockers Rhythm:Regular Rate:Normal     Neuro/Psych  Headaches, Anxiety    GI/Hepatic negative GI ROS, Neg liver ROS,   Endo/Other  negative endocrine ROS  Renal/GU negative Renal ROS  negative genitourinary   Musculoskeletal negative musculoskeletal ROS (+)   Abdominal   Peds  Hematology  (+) Blood dyscrasia, anemia ,   Anesthesia Other Findings   Reproductive/Obstetrics Irregular Menses                          Anesthesia Physical Anesthesia Plan  ASA: II  Anesthesia Plan: General   Post-op Pain Management:    Induction: Intravenous  Airway Management Planned: Oral ETT  Additional Equipment:   Intra-op Plan:   Post-operative Plan: Extubation in OR  Informed Consent: I have reviewed the patients History and Physical, chart, labs and discussed the procedure including the risks, benefits and alternatives for the proposed anesthesia with the patient or authorized representative who has indicated his/her understanding and acceptance.   Dental advisory given  Plan Discussed with: CRNA, Anesthesiologist and Surgeon  Anesthesia Plan Comments:         Anesthesia Quick Evaluation

## 2012-09-25 NOTE — Anesthesia Postprocedure Evaluation (Signed)
  Anesthesia Post-op Note  Patient: Victoria Bates  Procedure(s) Performed: Procedure(s): ROBOTIC ASSISTED TOTAL HYSTERECTOMY WITH BILATERAL SALPINGO OOPHORECTOMY (Bilateral)  Patient is awake and responsive. Pain and nausea are reasonably well controlled. Vital signs are stable and clinically acceptable. Oxygen saturation is clinically acceptable. There are no apparent anesthetic complications at this time. Patient is ready for discharge.

## 2012-09-25 NOTE — Progress Notes (Signed)
Subjective: Patient reports pain controlled.  She has anxiety. She states that her bp increases with her anxiety. She admits to taking 30 mg of tamazepam from home after been told by the nurse not to take her home medication. 2  Objective: I have reviewed patient's vital signs, intake and output and medications.  General: alert and cooperative GI: soft appropriately tender nondistended +BS   Assessment/Plan: POD #1 s/p robotic hysterectomy with bilateral salpingectomy Hypertension. Labile... Added Labetalol IV Anxiety. Xanax prn    LOS: 0 days    Michaeal Davis J. 09/25/2012, 9:54 PM

## 2012-09-25 NOTE — Op Note (Signed)
09/25/2012  5:34 PM  PATIENT:  Victoria Bates  32 y.o. female  PRE-OPERATIVE DIAGNOSIS:  Irregular Bleeding/CIN I CPT [phone]  POST-OPERATIVE DIAGNOSIS:  rregular Bleeding/CIN I CPT [phone]  PROCEDURE:  Robotic assisted laparoscopic hysterectomy with bilateral salpingectomy    Surgeon(s) and Role:    * Dorien Chihuahua. Richardson Dopp, MD - Primary    * Geryl Rankins, MD - Assisting  PHYSICIAN ASSISTANT: None   ASSISTANTS: Dr. Geryl Rankins    ANESTHESIA:   general  EBL:  Total I/O In: 2200 [I.V.:2200] Out: 375 [Urine:300; Blood:75]  BLOOD ADMINISTERED:none  DRAINS: Urinary Catheter (Foley)   LOCAL MEDICATIONS USED:  OTHER ropivicaine  SPECIMEN:  Source of Specimen:  uterus cervix and bilateral fallopian tubes   DISPOSITION OF SPECIMEN:  PATHOLOGY  COUNTS:  YES  TOURNIQUET:  * No tourniquets in log *  DICTATION: .Dragon Dictation  PLAN OF CARE: Admit for overnight observation  PATIENT DISPOSITION:  PACU - hemodynamically stable.   Delay start of Pharmacological VTE agent (>24hrs) due to surgical blood loss or risk of bleeding: not applicable  Procedure: The patient was taken to the operating room where she was placed under general anesthesia.Time out was performed. Marland Kitchen She was placed in dorsal lithotomy position and prepped and draped in the usual sterile fashion. A weighted speculum was placed into the vagina. A Deaver was placed anteriorly for retraction. The anterior lip of the cervix was grasped with a single-tooth tenaculum. The vaginal mucosa was injected with 2.5 cc of ropivacaine at the 2/4/ 8 and 10 o'clock positions. The uterus was sounded to 9 cm the cervix was dilated to 6 mm . 0 vicryl suture placed at the 12 and 6:00 positions Of the cervix to facilitate placement of a Ru mi uterine manipulator. The manipulator was placed without difficulty. Weighted speculum and Deaver were removed .  Attention was turned to the patient's abdomen where a 12 mm trocar was placed 2 cm above  the umbilicus. under direct visualization . The pneumoperitoneum was achieved with PCO2 gas. The laparoscope was removed. 60 cc of ropivacaine were injected into the abdominal cavity. The laparoscope was reinserted. An 8 mm trocar was placed in the right upper quadrant 8 centimeters from the umbilicus.later connected to robotic arm 1 ).  An 8 mm incision was made in the left upper quadrant 16 cm from the umbilicus and connected to robot arm #2. Marland Kitchen Attention was turned to the left upper quadrant where a 11 mm midclavicular assistant trocar was placed. ( All incision sites were injected with 10cc of ropivacaine prior to port placement. )  Once all ports had been placed under direct visualization.The laparoscope was removed and the da Vinci robotic system was thin right-sided docked. The robotic arms were connected to the corresponding trocars as listed above. The laparoscope was then reinserted. The PK bipolar cautery was placed into port #2. The monopolar scissor placed in the port #1. All instruments were directed into the pelvis under direct visualization.  Attention was turned to the surgeons console.. The left mesosalpinx and left utero-ovarian ligament was cauterized with PK and excised with scissors. The broad ligament was cauterized with PK incised with scissors. The round ligament was cauterized with the PK incised with scissors. The anterior leaf of broad ligament was incised along the bladder reflection to the midline.  The right mesosalpinx and right utero-ovarian ligament was cauterized with PK and excised with scissors. The right broad ligament was cauterized with PK excised scissors. The right round  ligament was cauterized with PK and excised with scissors. The broad ligament was incised to the midline. The bladder was dissected off the lower uterine segments of the cervix via sharp and blunt dissection. The bladder was filled in a retrograde fashion with methylene blue and sterile water to help with  dissection Of the bladder from the lower uterine segment.  The uterine arteries were skeleton bilaterally. They were cauterized with PK and transected. The KOH ring was identified. The anterior colpotomy was performed followed by the posterior colpotomy. Once the uterus,cervix and bilateral fallopian tubes were completely excised They were removed through the vagina.  The pk and scissors were removed and log tip forceps were placed in the port #1 and the cutting needle driver was placed in to port #2. The vaginal cuff angles were closed with figure-of-eight stitches of 0 Vicryl. The remainder of the vaginal cuff was closed with interrupted 0 Vicryl figure-of-eight sutures. The pelvis was irrigated. Pt was noted to have bleeding from the    Left vaginal cuff angle  Hemostasis was obtained with placement of a figure of eight 0 vicryl stitch . Marland Kitchen.Excellent hemostasis was noted. All pelvic pedicles were examined and hemostasis was noted. The right ureter was identified. The left ureter was identified. Inter seed was placed along the vaginal cuff. All instruments removed from the ports. All ports were removed under direct Visualization. The pneumoperitoneum was released. The fascia of the 12 mm umbilical port was closed with 0 Vicryl and the fascia the 11 mm port was closed with 0 Vicryl. The skin incisions were closed with 4-0 Vicryl and then covered with Derma bond.   Sponge lap and needle counts were correct x. The patient was awakened from anesthesia and taken to the recovery room in stable condition.

## 2012-09-26 ENCOUNTER — Encounter (HOSPITAL_COMMUNITY): Payer: Self-pay | Admitting: Obstetrics and Gynecology

## 2012-09-26 LAB — CBC
HCT: 30.8 % — ABNORMAL LOW (ref 36.0–46.0)
Hemoglobin: 10.2 g/dL — ABNORMAL LOW (ref 12.0–15.0)
MCH: 29.9 pg (ref 26.0–34.0)
MCHC: 33.1 g/dL (ref 30.0–36.0)
MCV: 90.3 fL (ref 78.0–100.0)
Platelets: 239 10*3/uL (ref 150–400)
RBC: 3.41 MIL/uL — ABNORMAL LOW (ref 3.87–5.11)
RDW: 12.1 % (ref 11.5–15.5)
WBC: 11.6 10*3/uL — ABNORMAL HIGH (ref 4.0–10.5)

## 2012-09-26 MED ORDER — METHOCARBAMOL 500 MG PO TABS
500.0000 mg | ORAL_TABLET | Freq: Four times a day (QID) | ORAL | Status: DC | PRN
  Administered 2012-09-26: 500 mg via ORAL
  Filled 2012-09-26: qty 1

## 2012-09-26 MED ORDER — HYDROCODONE-ACETAMINOPHEN 5-325 MG PO TABS
1.0000 | ORAL_TABLET | Freq: Four times a day (QID) | ORAL | Status: DC | PRN
  Administered 2012-09-26: 2 via ORAL
  Filled 2012-09-26: qty 2

## 2012-09-26 MED ORDER — METHOCARBAMOL 500 MG PO TABS: 500.0000 mg | ORAL_TABLET | Freq: Four times a day (QID) | ORAL | Status: DC | PRN

## 2012-09-26 MED ORDER — IBUPROFEN 800 MG PO TABS: 800.0000 mg | ORAL_TABLET | Freq: Three times a day (TID) | ORAL | Status: DC | PRN

## 2012-09-26 MED ORDER — HYDROCODONE-ACETAMINOPHEN 5-325 MG PO TABS: 1.0000 | ORAL_TABLET | Freq: Four times a day (QID) | ORAL | Status: DC | PRN

## 2012-09-26 NOTE — Progress Notes (Signed)
Subjective: Patient reports tolerating PO and no problems voiding. Pelvic cramping.Marland Kitchen Pt prefers vicodin over percocet.    Objective: I have reviewed patient's vital signs, intake and output, medications and labs.  General: alert and cooperative GI: soft appropriately tender nondistended +BS Extremities: extremities normal, atraumatic, no cyanosis or edema   Assessment/Plan: POD #1 s/p robotic hysterectomy with bilateral salpingectomy HTN pt to continue home meds follow up with pcp Pain control will add robaxin for muscle spasm D/c home on robaxin / vicodin / motrin Follow up in office in 2 wks    LOS: 1 day    Delainee Tramel J. 09/26/2012, 8:44 AM

## 2012-09-26 NOTE — Progress Notes (Signed)
Pt discharged to home with significant other.  Condition stable.  Pt to car via wheelchair with E. Pinion, NT.  No equipment for home ordered at discharge.

## 2012-09-26 NOTE — Anesthesia Postprocedure Evaluation (Signed)
Anesthesia Post Note  Patient: Victoria Bates  Procedure(s) Performed: Procedure(s) (LRB): ROBOTIC ASSISTED TOTAL HYSTERECTOMY WITH BILATERAL SALPINGO OOPHORECTOMY (Bilateral)  Anesthesia type: General  Patient location: Mother/Baby  Post pain: Pain level controlled  Post assessment: Post-op Vital signs reviewed  Last Vitals:  Filed Vitals:   09/26/12 0604  BP: 116/80  Pulse: 93  Temp: 36.7 C  Resp: 18    Post vital signs: Reviewed  Level of consciousness: awake and alert   Complications: No apparent anesthesia complications

## 2012-10-12 NOTE — Discharge Summary (Signed)
Physician Discharge Summary  Patient ID: Victoria Bates MRN: [id number] DOB/AGE: Apr 07, 1981 32 y.o.  Admit date: 09/25/2012 Discharge date: 10/12/2012  Admission Diagnoses: Cervical dysplasia/ Abnormal uterine bleeding   Discharge Diagnoses: s/p robotic assisted laparoscopic hysterectomy with bilateral salpingectomy  Active Problems:   * No active hospital problems. *   Discharged Condition: good  Hospital Course: Pt  Underwent robotic assisted laparoscopic hysterectomy with bilateral salpingectomy  Consults: None  Significant Diagnostic Studies: NA  Treatments: surgery:robotic assisted laparoscopic hysterectomy with bilateral salpingectomy    Discharge Exam: Blood pressure 128/86, pulse 82, temperature 97.8 F (36.6 C), temperature source Oral, resp. rate 18, height 5\' 9"  (1.753 m), weight 83.008 kg (183 lb), last menstrual period 09/20/2012, SpO2 98.00%. General appearance: alert and cooperative  Disposition: 01-Home or Self Care  Discharge Orders   Future Orders Complete By Expires     Call MD for:  persistant nausea and vomiting  As directed     Call MD for:  redness, tenderness, or signs of infection (pain, swelling, redness, odor or green/yellow discharge around incision site)  As directed     Call MD for:  severe uncontrolled pain  As directed     Call MD for:  temperature >100.4  As directed     Diet - low sodium heart healthy  As directed     Driving Restrictions  As directed     Comments:      Avoid driving for 1 week    Increase activity slowly  As directed     Lifting restrictions  As directed     Comments:      Avoid lifting over 10 lbs    Sexual Activity Restrictions  As directed     Comments:      Avoid sex        Medication List    STOP taking these medications       acetaminophen 500 MG tablet  Commonly known as:  TYLENOL      TAKE these medications       albuterol 108 (90 BASE) MCG/ACT inhaler  Commonly known as:  PROVENTIL HFA;VENTOLIN  HFA  Inhale 2 puffs into the lungs every 6 (six) hours as needed for wheezing (in summer time - occasional use only).     ALPRAZolam 0.5 MG tablet  Commonly known as:  XANAX  Take 1 mg by mouth daily as needed for anxiety. For anxiety     amLODipine 5 MG tablet  Commonly known as:  NORVASC  Take 5 mg by mouth every morning.     ascorbic acid 500 MG tablet  Commonly known as:  VITAMIN C  Take 500 mg by mouth daily.     b complex vitamins tablet  Take 1 tablet by mouth daily.     dicyclomine 10 MG capsule  Commonly known as:  BENTYL  Take 10 mg by mouth 4 (four) times daily -  before meals and at bedtime.     diphenhydrAMINE 25 mg capsule  Commonly known as:  BENADRYL  Take 50 mg by mouth at bedtime as needed for sleep.     ferrous sulfate 325 (65 FE) MG tablet  Take 325 mg by mouth daily with breakfast.     HYDROcodone-acetaminophen 5-325 MG per tablet  Commonly known as:  NORCO/VICODIN  Take 1-2 tablets by mouth every 6 (six) hours as needed.     ibuprofen 800 MG tablet  Commonly known as:  ADVIL,MOTRIN  Take 1 tablet (800 mg  total) by mouth every 8 (eight) hours as needed (mild pain).     methocarbamol 500 MG tablet  Commonly known as:  ROBAXIN  Take 1 tablet (500 mg total) by mouth every 6 (six) hours as needed.     metoprolol succinate 25 MG 24 hr tablet  Commonly known as:  TOPROL-XL  Take 25 mg by mouth daily.     sertraline 50 MG tablet  Commonly known as:  ZOLOFT  Take 50 mg by mouth every morning.     Vitamin D3 5000 UNITS Tabs  Take 5,000 tablets by mouth daily.           Follow-up Information   Follow up with Jessee Avers., MD. Schedule an appointment as soon as possible for a visit in 2 weeks.   Contact information:   [address] [place] [address] [phone]       Signed: Jessee Avers. 10/12/2012, 5:48 PM

## 2012-11-26 ENCOUNTER — Ambulatory Visit: Payer: Medicaid Other | Attending: Orthopedic Surgery | Admitting: Physical Therapy

## 2012-11-26 DIAGNOSIS — R293 Abnormal posture: Secondary | ICD-10-CM | POA: Insufficient documentation

## 2012-11-26 DIAGNOSIS — M25619 Stiffness of unspecified shoulder, not elsewhere classified: Secondary | ICD-10-CM | POA: Insufficient documentation

## 2012-11-26 DIAGNOSIS — M25519 Pain in unspecified shoulder: Secondary | ICD-10-CM | POA: Insufficient documentation

## 2012-11-26 DIAGNOSIS — IMO0001 Reserved for inherently not codable concepts without codable children: Secondary | ICD-10-CM | POA: Insufficient documentation

## 2012-12-04 ENCOUNTER — Ambulatory Visit: Payer: Medicaid Other | Admitting: Physical Therapy

## 2012-12-11 ENCOUNTER — Ambulatory Visit: Payer: Medicaid Other | Admitting: Physical Therapy

## 2012-12-18 ENCOUNTER — Encounter: Payer: Medicaid Other | Admitting: Physical Therapy

## 2012-12-20 ENCOUNTER — Ambulatory Visit: Payer: Medicaid Other | Attending: Orthopedic Surgery | Admitting: Physical Therapy

## 2012-12-20 DIAGNOSIS — M25519 Pain in unspecified shoulder: Secondary | ICD-10-CM | POA: Insufficient documentation

## 2012-12-20 DIAGNOSIS — M25619 Stiffness of unspecified shoulder, not elsewhere classified: Secondary | ICD-10-CM | POA: Insufficient documentation

## 2012-12-20 DIAGNOSIS — R293 Abnormal posture: Secondary | ICD-10-CM | POA: Insufficient documentation

## 2012-12-20 DIAGNOSIS — IMO0001 Reserved for inherently not codable concepts without codable children: Secondary | ICD-10-CM | POA: Insufficient documentation

## 2013-01-07 ENCOUNTER — Encounter (HOSPITAL_COMMUNITY): Payer: Self-pay | Admitting: Emergency Medicine

## 2013-01-07 ENCOUNTER — Emergency Department (HOSPITAL_COMMUNITY)
Admission: EM | Admit: 2013-01-07 | Discharge: 2013-01-07 | Disposition: A | Discharge status: Home / Self Care | Payer: Medicaid Other | Attending: Emergency Medicine | Admitting: Emergency Medicine

## 2013-01-07 DIAGNOSIS — J45909 Unspecified asthma, uncomplicated: Secondary | ICD-10-CM | POA: Insufficient documentation

## 2013-01-07 DIAGNOSIS — K625 Hemorrhage of anus and rectum: Secondary | ICD-10-CM | POA: Insufficient documentation

## 2013-01-07 DIAGNOSIS — Z862 Personal history of diseases of the blood and blood-forming organs and certain disorders involving the immune mechanism: Secondary | ICD-10-CM | POA: Insufficient documentation

## 2013-01-07 DIAGNOSIS — Z8679 Personal history of other diseases of the circulatory system: Secondary | ICD-10-CM | POA: Insufficient documentation

## 2013-01-07 DIAGNOSIS — I10 Essential (primary) hypertension: Secondary | ICD-10-CM | POA: Insufficient documentation

## 2013-01-07 DIAGNOSIS — Z91040 Latex allergy status: Secondary | ICD-10-CM | POA: Insufficient documentation

## 2013-01-07 DIAGNOSIS — K921 Melena: Secondary | ICD-10-CM | POA: Insufficient documentation

## 2013-01-07 DIAGNOSIS — F411 Generalized anxiety disorder: Secondary | ICD-10-CM | POA: Insufficient documentation

## 2013-01-07 LAB — CBC
HCT: 38.5 % (ref 36.0–46.0)
HCT: 39.6 % (ref 36.0–46.0)
Hemoglobin: 12.7 g/dL (ref 12.0–15.0)
Hemoglobin: 13.4 g/dL (ref 12.0–15.0)
MCH: 30 pg (ref 26.0–34.0)
MCH: 30.5 pg (ref 26.0–34.0)
MCHC: 33 g/dL (ref 30.0–36.0)
MCHC: 33.8 g/dL (ref 30.0–36.0)
MCV: 90.8 fL (ref 78.0–100.0)
MCV: 90 fL (ref 78.0–100.0)
Platelets: 272 10*3/uL (ref 150–400)
Platelets: 265 10*3/uL (ref 150–400)
RBC: 4.24 MIL/uL (ref 3.87–5.11)
RBC: 4.4 MIL/uL (ref 3.87–5.11)
RDW: 11.9 % (ref 11.5–15.5)
RDW: 11.8 % (ref 11.5–15.5)
WBC: 7.8 10*3/uL (ref 4.0–10.5)
WBC: 9.2 10*3/uL (ref 4.0–10.5)

## 2013-01-07 LAB — COMPREHENSIVE METABOLIC PANEL
ALT: 14 U/L (ref 0–35)
AST: 16 U/L (ref 0–37)
Albumin: 3.9 g/dL (ref 3.5–5.2)
Alkaline Phosphatase: 50 U/L (ref 39–117)
BUN: 9 mg/dL (ref 6–23)
CO2: 24 mEq/L (ref 19–32)
Calcium: 9.7 mg/dL (ref 8.4–10.5)
Chloride: 104 mEq/L (ref 96–112)
Creatinine, Ser: 0.57 mg/dL (ref 0.50–1.10)
GFR calc Af Amer: >90 mL/min (ref >90)
GFR calc non Af Amer: >90 mL/min (ref >90)
Glucose, Bld: 101 mg/dL — ABNORMAL HIGH (ref 70–99)
Potassium: 4 mEq/L (ref 3.5–5.1)
Sodium: 138 mEq/L (ref 135–145)
Total Bilirubin: 0.2 mg/dL — ABNORMAL LOW (ref 0.3–1.2)
Total Protein: 7.7 g/dL (ref 6.0–8.3)

## 2013-01-07 LAB — TYPE AND SCREEN
ABO/RH(D): A NEG
Antibody Screen: NEGATIVE

## 2013-01-07 LAB — ABO/RH: ABO/RH(D): A NEG

## 2013-01-07 NOTE — ED Notes (Signed)
Asked Lauren, RN to advise when IV is in so that I can complete orthostatic vitals

## 2013-01-07 NOTE — ED Provider Notes (Signed)
History    CSN: [id number] Arrival date & time 01/07/13  1632  First MD Initiated Contact with Patient 01/07/13 2032     Chief Complaint  Patient presents with  . Rectal Bleeding   (Consider location/radiation/quality/duration/timing/severity/associated sxs/prior Treatment) HPI Comments: 32 y/o female with a PMHx of liver hemangioma, HTN and anemia presents to the ED complaining of rectal bleeding beginning around 6:30 this morning. She has been having rectal bleeding issues for the past 6 months. Has had 5-6 episodes since, about 2-3 tablespoons of BRB on each occurrence. Denies pain, diarrhea, abdominal pain, nausea, vomiting, weakens's, fatigue, lightheadedness, dizziness. Triage summary states dizziness and weakness, however patient denies this. Called her GI Dr. Dulce Sellar this morning and was advised to go to ED. She is due for MRI of abdomen to evaluate liver hemangiomas on July 1, and PCP suggested she mention a possible MRI in the ED. Last colonoscopy 08/2012 and no source of rectal bleeding seen. Has not had much of an appetite today. No trauma to rectum.  Patient is a 32 y.o. female presenting with hematochezia. The history is provided by the patient.  Rectal Bleeding Associated symptoms: no abdominal pain, no dizziness, no fever, no light-headedness and no vomiting    Past Medical History  Diagnosis Date  . Liver hemangioma   . Hypertension   . Anemia   . Headache(784.0)     migraines  . Anxiety   . Asthma     very mild- no inhaler use since July 2013  . Complication of anesthesia     takes longer for patient to go under anesthesia  . PONV (postoperative nausea and vomiting)   . SVD (spontaneous vaginal delivery)     x 1   Past Surgical History  Procedure Laterality Date  . Cesarean section    . Coposcopy  08/06/2012    cervical biopsy  . Left knee surgery  2000    torn meniscus  . Right flank surgery  2005    bnign mass from flank area-right  .  Esophagogastroduodenoscopy (egd) with propofol N/A 08/29/2012    Procedure: ESOPHAGOGASTRODUODENOSCOPY (EGD) WITH PROPOFOL;  Surgeon: Willis Modena, MD;  Location: [facility];  Service: Endoscopy;  Laterality: N/A;  . Colonoscopy with propofol N/A 08/29/2012    Procedure: COLONOSCOPY WITH PROPOFOL;  Surgeon: Willis Modena, MD;  Location: [facility];  Service: Endoscopy;  Laterality: N/A;  . Wisdom tooth extraction    . Robotic assisted total hysterectomy with bilateral salpingo oopherectomy Bilateral 09/25/2012    Procedure: ROBOTIC ASSISTED TOTAL HYSTERECTOMY WITH BILATERAL SALPINGO OOPHORECTOMY;  Surgeon: Dorien Chihuahua. Richardson Dopp, MD;  Location: [facility];  Service: Gynecology;  Laterality: Bilateral;   No family history on file. History  Substance Use Topics  . Smoking status: Never Smoker   . Smokeless tobacco: Never Used  . Alcohol Use: No   OB History   Grav Para Term Preterm Abortions TAB SAB Ect Mult Living                 Review of Systems  Constitutional: Positive for appetite change. Negative for fever and chills.  Respiratory: Negative for shortness of breath.   Cardiovascular: Negative for chest pain.  Gastrointestinal: Positive for blood in stool and hematochezia. Negative for nausea, vomiting, abdominal pain, diarrhea, constipation and rectal pain.  Musculoskeletal: Negative for back pain.  Skin: Negative for pallor.  Neurological: Negative for dizziness and light-headedness.  All other systems reviewed and are negative.    Allergies  Adhesive; Latex;  and Shellfish allergy  Home Medications   Current Outpatient Rx  Name  Route  Sig  Dispense  Refill  . acetaminophen (TYLENOL) 500 MG tablet   Oral   Take 1,000 mg by mouth every 6 (six) hours as needed for pain.         Marland Kitchen albuterol (PROVENTIL HFA;VENTOLIN HFA) 108 (90 BASE) MCG/ACT inhaler   Inhalation   Inhale 2 puffs into the lungs every 6 (six) hours as needed for wheezing (in summer time - occasional use only).          . ALPRAZolam (XANAX) 1 MG tablet   Oral   Take 1 mg by mouth 3 (three) times daily as needed for anxiety.         Marland Kitchen aspirin 325 MG tablet   Oral   Take 325 mg by mouth every 6 (six) hours as needed for pain.         Marland Kitchen temazepam (RESTORIL) 30 MG capsule   Oral   Take 30 mg by mouth at bedtime as needed for sleep.          BP 152/82  Pulse 80  Temp(Src) 98.3 F (36.8 C) (Oral)  Resp 20  SpO2 99%  LMP 09/20/2012 Physical Exam  Nursing note and vitals reviewed. Constitutional: She is oriented to person, place, and time. She appears well-developed and well-nourished. No distress.  HENT:  Head: Normocephalic and atraumatic.  Mouth/Throat: Oropharynx is clear and moist.  Eyes: Conjunctivae and EOM are normal. Pupils are equal, round, and reactive to light. No scleral icterus.  Neck: Normal range of motion. Neck supple.  Cardiovascular: Normal rate, regular rhythm and normal heart sounds.   Pulmonary/Chest: Effort normal and breath sounds normal.  Abdominal: Soft. Bowel sounds are normal. She exhibits no distension and no mass. There is no tenderness.  Genitourinary: Rectal exam shows no external hemorrhoid, no internal hemorrhoid, no fissure, no mass, no tenderness and anal tone normal.  Small amount of BRB on exam glove mixed with normal stool color.  Musculoskeletal: Normal range of motion. She exhibits no edema.  Neurological: She is alert and oriented to person, place, and time.  Skin: Skin is warm and dry. She is not diaphoretic. No pallor.  Psychiatric: She has a normal mood and affect. Her behavior is normal.    ED Course  Procedures (including critical care time) Labs Reviewed  COMPREHENSIVE METABOLIC PANEL - Abnormal; Notable for the following:    Glucose, Bld 101 (*)    Total Bilirubin 0.2 (*)    All other components within normal limits  CBC  TYPE AND SCREEN  ABO/RH   No results found. 1. Rectal bleeding     MDM  32 y/o female with rectal  bleeding. She is in NAD, PE unremarkable other than small amount of BRB on exam glove. Asymptomatic. Hgb initially 12.7, increased rather than decreased to 13.4 after sitting in ED for 4 hours. Vitals stable. Discharged home with instructions to call her GI doctor in the morning. Patient discussed with Dr. Rhunette Croft who agrees with plan of care.  Trevor Mace, PA-C 01/07/13 2216

## 2013-01-07 NOTE — ED Notes (Signed)
Pt presenting to ed with c/o rectal bleeding since 6:00am this morning pt states she was told to present to ed by her GI doctor pt denies nausea, vomiting and abdominal pain pt states she feels very weak and has dizziness. Pt states her pcp suggest that she mention an MRI instead of ct scan because she is suppose to have mri next month for lesions on her liver

## 2013-01-08 NOTE — ED Provider Notes (Signed)
Medical screening examination/treatment/procedure(s) were performed by non-physician practitioner and as supervising physician I was immediately available for consultation/collaboration.  Derwood Kaplan, MD 01/08/13 [phone]

## 2013-01-30 ENCOUNTER — Other Ambulatory Visit: Payer: Self-pay | Admitting: Internal Medicine

## 2013-01-30 DIAGNOSIS — K769 Liver disease, unspecified: Secondary | ICD-10-CM

## 2013-02-06 ENCOUNTER — Other Ambulatory Visit: Payer: Medicaid Other

## 2013-02-08 ENCOUNTER — Ambulatory Visit
Admission: RE | Admit: 2013-02-08 | Discharge: 2013-02-08 | Disposition: A | Discharge status: Home / Self Care | Payer: Medicaid Other | Source: Ambulatory Visit | Attending: Internal Medicine | Admitting: Internal Medicine

## 2013-02-08 DIAGNOSIS — K769 Liver disease, unspecified: Secondary | ICD-10-CM

## 2013-02-08 MED ORDER — GADOXETATE DISODIUM 0.25 MMOL/ML IV SOLN
8.0000 mL | Freq: Once | INTRAVENOUS | Status: AC | PRN
  Administered 2013-02-08: 8 mL via INTRAVENOUS

## 2013-05-25 ENCOUNTER — Emergency Department (HOSPITAL_COMMUNITY)
Admission: EM | Admit: 2013-05-25 | Discharge: 2013-05-25 | Disposition: A | Discharge status: Home / Self Care | Payer: Medicaid Other | Source: Home / Self Care

## 2013-05-25 ENCOUNTER — Encounter (HOSPITAL_COMMUNITY): Payer: Self-pay | Admitting: Emergency Medicine

## 2013-05-25 ENCOUNTER — Emergency Department (INDEPENDENT_AMBULATORY_CARE_PROVIDER_SITE_OTHER): Payer: Medicaid Other

## 2013-05-25 DIAGNOSIS — R059 Cough, unspecified: Secondary | ICD-10-CM

## 2013-05-25 DIAGNOSIS — R058 Other specified cough: Secondary | ICD-10-CM

## 2013-05-25 DIAGNOSIS — R05 Cough: Secondary | ICD-10-CM

## 2013-05-25 LAB — POCT RAPID STREP A: Streptococcus, Group A Screen (Direct): NEGATIVE

## 2013-05-25 MED ORDER — HYDROCOD POLST-CHLORPHEN POLST 10-8 MG/5ML PO LQCR: 5.0000 mL | Freq: Two times a day (BID) | ORAL | Status: DC | PRN

## 2013-05-25 MED ORDER — PREDNISONE 50 MG PO TABS: ORAL_TABLET | ORAL | Status: DC

## 2013-05-25 MED ORDER — PREDNISONE 20 MG PO TABS
ORAL_TABLET | ORAL | Status: AC
  Filled 2013-05-25: qty 1

## 2013-05-25 MED ORDER — IPRATROPIUM BROMIDE 0.06 % NA SOLN: 2.0000 | Freq: Four times a day (QID) | NASAL | Status: DC

## 2013-05-25 MED ORDER — PREDNISONE 20 MG PO TABS
20.0000 mg | ORAL_TABLET | Freq: Once | ORAL | Status: AC
  Administered 2013-05-25: 20 mg via ORAL

## 2013-05-25 NOTE — ED Provider Notes (Signed)
CSN: [id number]     Arrival date & time 05/25/13  1800 History   None    Chief Complaint  Patient presents with  . Cough   (Consider location/radiation/quality/duration/timing/severity/associated sxs/prior Treatment) Patient is a 32 y.o. female presenting with cough. The history is provided by the patient.  Cough Cough characteristics:  Productive Sputum characteristics:  Green Severity:  Moderate Onset quality:  Gradual Duration:  2 weeks Timing:  Constant Progression:  Worsening Chronicity:  New Smoker: no   Context comment:  Seen tues and thurs this week , given z-pak, which didn't help,still coughing., no fever. Associated symptoms: rhinorrhea   Associated symptoms: no shortness of breath and no wheezing     Past Medical History  Diagnosis Date  . Liver hemangioma   . Hypertension   . Anemia   . Headache(784.0)     migraines  . Anxiety   . Asthma     very mild- no inhaler use since July 2013  . Complication of anesthesia     takes longer for patient to go under anesthesia  . PONV (postoperative nausea and vomiting)   . SVD (spontaneous vaginal delivery)     x 1   Past Surgical History  Procedure Laterality Date  . Cesarean section    . Coposcopy  08/06/2012    cervical biopsy  . Left knee surgery  2000    torn meniscus  . Right flank surgery  2005    bnign mass from flank area-right  . Esophagogastroduodenoscopy (egd) with propofol N/A 08/29/2012    Procedure: ESOPHAGOGASTRODUODENOSCOPY (EGD) WITH PROPOFOL;  Surgeon: Willis Modena, MD;  Location: [facility];  Service: Endoscopy;  Laterality: N/A;  . Colonoscopy with propofol N/A 08/29/2012    Procedure: COLONOSCOPY WITH PROPOFOL;  Surgeon: Willis Modena, MD;  Location: [facility];  Service: Endoscopy;  Laterality: N/A;  . Wisdom tooth extraction    . Robotic assisted total hysterectomy with bilateral salpingo oopherectomy Bilateral 09/25/2012    Procedure: ROBOTIC ASSISTED TOTAL HYSTERECTOMY WITH  BILATERAL SALPINGO OOPHORECTOMY;  Surgeon: Dorien Chihuahua. Richardson Dopp, MD;  Location: [facility];  Service: Gynecology;  Laterality: Bilateral;   History reviewed. No pertinent family history. History  Substance Use Topics  . Smoking status: Never Smoker   . Smokeless tobacco: Never Used  . Alcohol Use: No   OB History   Grav Para Term Preterm Abortions TAB SAB Ect Mult Living                 Review of Systems  Constitutional: Negative.   HENT: Positive for rhinorrhea and sinus pressure.   Respiratory: Positive for cough. Negative for shortness of breath and wheezing.   Cardiovascular: Negative.   Gastrointestinal: Negative.     Allergies  Adhesive; Latex; and Shellfish allergy  Home Medications   Current Outpatient Rx  Name  Route  Sig  Dispense  Refill  . acetaminophen (TYLENOL) 500 MG tablet   Oral   Take 1,000 mg by mouth every 6 (six) hours as needed for pain.         Marland Kitchen albuterol (PROVENTIL HFA;VENTOLIN HFA) 108 (90 BASE) MCG/ACT inhaler   Inhalation   Inhale 2 puffs into the lungs every 6 (six) hours as needed for wheezing (in summer time - occasional use only).         . ALPRAZolam (XANAX) 1 MG tablet   Oral   Take 1 mg by mouth 3 (three) times daily as needed for anxiety.         Marland Kitchen  aspirin 325 MG tablet   Oral   Take 325 mg by mouth every 6 (six) hours as needed for pain.         . chlorpheniramine-HYDROcodone (TUSSIONEX PENNKINETIC ER) 10-8 MG/5ML LQCR   Oral   Take 5 mLs by mouth every 12 (twelve) hours as needed for cough.   115 mL   1   . ipratropium (ATROVENT) 0.06 % nasal spray   Nasal   Place 2 sprays into the nose 4 (four) times daily.   15 mL   1   . predniSONE (DELTASONE) 50 MG tablet      1 tab daily for 2 days then 1/2 tab for 2 days.   3 tablet   0   . temazepam (RESTORIL) 30 MG capsule   Oral   Take 30 mg by mouth at bedtime as needed for sleep.          LMP 09/20/2012 Physical Exam  Nursing note and vitals reviewed. Constitutional:  She is oriented to person, place, and time. She appears well-developed and well-nourished.  HENT:  Head: Normocephalic.  Right Ear: External ear normal.  Left Ear: External ear normal.  Nose: Rhinorrhea present. Right sinus exhibits maxillary sinus tenderness and frontal sinus tenderness. Left sinus exhibits maxillary sinus tenderness and frontal sinus tenderness.  Mouth/Throat: Oropharynx is clear and moist.  Eyes: Conjunctivae are normal. Pupils are equal, round, and reactive to light.  Neck: Normal range of motion. Neck supple.  Cardiovascular: Regular rhythm and normal heart sounds.   Pulmonary/Chest: Effort normal and breath sounds normal.  Abdominal: Soft. Bowel sounds are normal.  Neurological: She is alert and oriented to person, place, and time.  Skin: Skin is warm and dry.    ED Course  Procedures (including critical care time) Labs Review Labs Reviewed  POCT RAPID STREP A ([facility] ONLY)   Imaging Review Dg Sinuses Complete  05/25/2013   CLINICAL DATA:  Sinus drainage, pain  EXAM: PARANASAL SINUSES - COMPLETE 3 + VIEW  COMPARISON:  None.  FINDINGS: The paranasal sinus are aerated. There is no evidence of sinus opacification air-fluid levels or mucosal thickening. No significant bone abnormalities are seen.  IMPRESSION: Negative.   Electronically Signed   By: Natasha Mead M.D.   On: 05/25/2013 18:54    EKG Interpretation     Ventricular Rate:    PR Interval:    QRS Duration:   QT Interval:    QTC Calculation:   R Axis:     Text Interpretation:              MDM  X-rays reviewed and report per radiologist.     Linna Hoff, MD 05/25/13 Windell Moment

## 2013-05-25 NOTE — ED Notes (Addendum)
Pt  Seen   X  2  For           brochchitis          And     Almost   Finished  A    z  Pack         She  Continues  To  Have  A  Cough            And  Has  Some  Tightness  In  Chest        And  sorethroat              Pt  Reports  She  received  A  tdap  On  Thursday

## 2013-05-28 LAB — CULTURE, GROUP A STREP

## 2013-08-07 ENCOUNTER — Other Ambulatory Visit (HOSPITAL_COMMUNITY)
Admission: RE | Admit: 2013-08-07 | Discharge: 2013-08-07 | Disposition: A | Discharge status: Home / Self Care | Payer: Medicaid Other | Source: Ambulatory Visit | Attending: Obstetrics and Gynecology | Admitting: Obstetrics and Gynecology

## 2013-08-07 ENCOUNTER — Other Ambulatory Visit: Payer: Self-pay | Admitting: Obstetrics and Gynecology

## 2013-08-07 DIAGNOSIS — Z01419 Encounter for gynecological examination (general) (routine) without abnormal findings: Secondary | ICD-10-CM | POA: Insufficient documentation

## 2013-09-06 ENCOUNTER — Ambulatory Visit
Admission: RE | Admit: 2013-09-06 | Discharge: 2013-09-06 | Disposition: A | Discharge status: Home / Self Care | Payer: Medicaid Other | Source: Ambulatory Visit | Attending: Nurse Practitioner | Admitting: Nurse Practitioner

## 2013-09-06 ENCOUNTER — Other Ambulatory Visit: Payer: Self-pay | Admitting: Nurse Practitioner

## 2013-09-06 DIAGNOSIS — R599 Enlarged lymph nodes, unspecified: Secondary | ICD-10-CM

## 2013-09-06 DIAGNOSIS — R591 Generalized enlarged lymph nodes: Secondary | ICD-10-CM

## 2013-09-13 ENCOUNTER — Other Ambulatory Visit: Payer: Self-pay | Admitting: Internal Medicine

## 2013-09-13 DIAGNOSIS — R109 Unspecified abdominal pain: Secondary | ICD-10-CM

## 2013-09-18 ENCOUNTER — Ambulatory Visit
Admission: RE | Admit: 2013-09-18 | Discharge: 2013-09-18 | Disposition: A | Discharge status: Home / Self Care | Payer: Medicaid Other | Source: Ambulatory Visit | Attending: Internal Medicine | Admitting: Internal Medicine

## 2013-09-18 DIAGNOSIS — R109 Unspecified abdominal pain: Secondary | ICD-10-CM

## 2013-09-18 MED ORDER — GADOXETATE DISODIUM 0.25 MMOL/ML IV SOLN
6.0000 mL | Freq: Once | INTRAVENOUS | Status: AC | PRN
  Administered 2013-09-18: 6 mL via INTRAVENOUS

## 2013-09-19 ENCOUNTER — Encounter (HOSPITAL_COMMUNITY): Payer: Self-pay | Admitting: Emergency Medicine

## 2013-09-19 ENCOUNTER — Inpatient Hospital Stay (HOSPITAL_COMMUNITY)
Admission: EM | Admit: 2013-09-19 | Discharge: 2013-09-21 | DRG: 055 | Disposition: A | Discharge status: Home / Self Care | Payer: Medicaid Other | Attending: Internal Medicine | Admitting: Internal Medicine

## 2013-09-19 DIAGNOSIS — C711 Malignant neoplasm of frontal lobe: Principal | ICD-10-CM | POA: Diagnosis present

## 2013-09-19 DIAGNOSIS — I10 Essential (primary) hypertension: Secondary | ICD-10-CM | POA: Diagnosis present

## 2013-09-19 DIAGNOSIS — M4120 Other idiopathic scoliosis, site unspecified: Secondary | ICD-10-CM | POA: Diagnosis present

## 2013-09-19 DIAGNOSIS — R202 Paresthesia of skin: Secondary | ICD-10-CM | POA: Diagnosis present

## 2013-09-19 DIAGNOSIS — H538 Other visual disturbances: Secondary | ICD-10-CM | POA: Diagnosis present

## 2013-09-19 DIAGNOSIS — R209 Unspecified disturbances of skin sensation: Secondary | ICD-10-CM | POA: Diagnosis present

## 2013-09-19 DIAGNOSIS — Z91040 Latex allergy status: Secondary | ICD-10-CM

## 2013-09-19 DIAGNOSIS — J45909 Unspecified asthma, uncomplicated: Secondary | ICD-10-CM | POA: Diagnosis present

## 2013-09-19 DIAGNOSIS — I7300 Raynaud's syndrome without gangrene: Secondary | ICD-10-CM | POA: Diagnosis present

## 2013-09-19 DIAGNOSIS — Z91013 Allergy to seafood: Secondary | ICD-10-CM

## 2013-09-19 DIAGNOSIS — F411 Generalized anxiety disorder: Secondary | ICD-10-CM | POA: Diagnosis present

## 2013-09-19 DIAGNOSIS — G8929 Other chronic pain: Secondary | ICD-10-CM | POA: Diagnosis present

## 2013-09-19 DIAGNOSIS — IMO0001 Reserved for inherently not codable concepts without codable children: Secondary | ICD-10-CM

## 2013-09-19 DIAGNOSIS — Z8249 Family history of ischemic heart disease and other diseases of the circulatory system: Secondary | ICD-10-CM

## 2013-09-19 DIAGNOSIS — M549 Dorsalgia, unspecified: Secondary | ICD-10-CM | POA: Diagnosis present

## 2013-09-19 NOTE — ED Notes (Signed)
Progressive vision loss in left eye; hard to swallow on left side - feels numb. "eye feels heavy." no dysarthria, grips wnl, steady gait. Saw eye dr. Wilburn Mylar for possible optic neuritis.

## 2013-09-19 NOTE — ED Provider Notes (Signed)
CSN: [id number]     Arrival date & time 09/19/13  2315 History   First MD Initiated Contact with Patient 09/19/13 2332     Chief Complaint  Patient presents with  . Loss of Vision     (Consider location/radiation/quality/duration/timing/severity/associated sxs/prior Treatment) HPI Hx per PT - L eye vision changes onset 4 days ago, describes difficulty seeing out of that eye, bright lights make it worse, she sees white lights that look more yellow - she saw an ophthalmologist yesterday and was evaluated for possible optic neuritis. An MRI has been scheduled. Tonight she developed some left-sided facial numbness and a coworker noticed that her left face did not look right. She presents here for further evaluation, also having persistent vision problems. No difficulty with speech, gait, grips. She denies any unilateral weakness or numbness otherwise. Symptoms mild to moderate in severity.  Past Medical History  Diagnosis Date  . Liver hemangioma   . Hypertension   . Anemia   . Headache(784.0)     migraines  . Anxiety   . Asthma     very mild- no inhaler use since July 2013  . Complication of anesthesia     takes longer for patient to go under anesthesia  . PONV (postoperative nausea and vomiting)   . SVD (spontaneous vaginal delivery)     x 1   Past Surgical History  Procedure Laterality Date  . Cesarean section    . Coposcopy  08/06/2012    cervical biopsy  . Left knee surgery  2000    torn meniscus  . Right flank surgery  2005    bnign mass from flank area-right  . Esophagogastroduodenoscopy (egd) with propofol N/A 08/29/2012    Procedure: ESOPHAGOGASTRODUODENOSCOPY (EGD) WITH PROPOFOL;  Surgeon: Arta Silence, MD;  Location: [facility];  Service: Endoscopy;  Laterality: N/A;  . Colonoscopy with propofol N/A 08/29/2012    Procedure: COLONOSCOPY WITH PROPOFOL;  Surgeon: Arta Silence, MD;  Location: [facility];  Service: Endoscopy;  Laterality: N/A;  . Wisdom tooth  extraction    . Robotic assisted total hysterectomy with bilateral salpingo oopherectomy Bilateral 09/25/2012    Procedure: ROBOTIC ASSISTED TOTAL HYSTERECTOMY WITH BILATERAL SALPINGO OOPHORECTOMY;  Surgeon: Maeola Sarah. Landry Mellow, MD;  Location: [facility];  Service: Gynecology;  Laterality: Bilateral;   History reviewed. No pertinent family history. History  Substance Use Topics  . Smoking status: Never Smoker   . Smokeless tobacco: Never Used  . Alcohol Use: No   OB History   Grav Para Term Preterm Abortions TAB SAB Ect Mult Living                 Review of Systems  Constitutional: Negative for fever and chills.  Eyes: Positive for visual disturbance.  Respiratory: Negative for shortness of breath.   Cardiovascular: Negative for chest pain.  Gastrointestinal: Negative for abdominal pain.  Genitourinary: Negative for dysuria.  Musculoskeletal: Negative for back pain, neck pain and neck stiffness.  Skin: Negative for rash.  Neurological: Positive for numbness. Negative for speech difficulty.  All other systems reviewed and are negative.      Allergies  Adhesive; Latex; and Shellfish allergy  Home Medications   Current Outpatient Rx  Name  Route  Sig  Dispense  Refill  . acetaminophen (TYLENOL) 500 MG tablet   Oral   Take 1,000 mg by mouth every 6 (six) hours as needed for pain.         Marland Kitchen albuterol (PROVENTIL HFA;VENTOLIN HFA) 108 (90 BASE)  MCG/ACT inhaler   Inhalation   Inhale 2 puffs into the lungs every 6 (six) hours as needed for wheezing (in summer time - occasional use only).         . ALPRAZolam (XANAX) 1 MG tablet   Oral   Take 1 mg by mouth 3 (three) times daily as needed for anxiety.         Marland Kitchen aspirin 325 MG tablet   Oral   Take 325 mg by mouth every 6 (six) hours as needed for pain.         . chlorpheniramine-HYDROcodone (TUSSIONEX PENNKINETIC ER) 10-8 MG/5ML LQCR   Oral   Take 5 mLs by mouth every 12 (twelve) hours as needed for cough.   115 mL   1    . ipratropium (ATROVENT) 0.06 % nasal spray   Nasal   Place 2 sprays into the nose 4 (four) times daily.   15 mL   1   . predniSONE (DELTASONE) 50 MG tablet      1 tab daily for 2 days then 1/2 tab for 2 days.   3 tablet   0   . temazepam (RESTORIL) 30 MG capsule   Oral   Take 30 mg by mouth at bedtime as needed for sleep.          BP 124/94  Pulse 105  Temp(Src) 98.2 F (36.8 C) (Oral)  Resp 13  Ht 5\' 8"  (1.727 m)  Wt 142 lb 3 oz (64.496 kg)  BMI 21.62 kg/m2  SpO2 98%  LMP 09/20/2012 Physical Exam  Constitutional: She is oriented to person, place, and time. She appears well-developed and well-nourished.  HENT:  Head: Normocephalic and atraumatic.  Eyes: EOM are normal. Pupils are equal, round, and reactive to light.  No papilledema. no afferent pupillary defect  Neck: Normal range of motion. Neck supple. No tracheal deviation present.  Cardiovascular: Normal rate, regular rhythm and intact distal pulses.   Pulmonary/Chest: Effort normal and breath sounds normal. No respiratory distress.  Abdominal: Soft. She exhibits no distension.  Musculoskeletal: Normal range of motion. She exhibits no edema.  Neurological: She is alert and oriented to person, place, and time. She displays normal reflexes. No cranial nerve deficit. Coordination normal.  No facial droop. Speech clear. No pronator drip. Equal grips, biceps, triceps strengths. Normal gait.  Skin: Skin is warm and dry.    ED Course  Procedures (including critical care time) Labs Review Labs Reviewed  CBC - Abnormal; Notable for the following:    RDW 11.4 (*)    All other components within normal limits  I-STAT CHEM 8, ED - Abnormal; Notable for the following:    Potassium 3.3 (*)    BUN 5 (*)    All other components within normal limits  C-REACTIVE PROTEIN  SEDIMENTATION RATE  TSH  COMPREHENSIVE METABOLIC PANEL  CBC WITH DIFFERENTIAL  HEMOGLOBIN A1C  LIPID PANEL  URINE RAPID DRUG SCREEN (HOSP  PERFORMED)   Imaging Review Ct Head Wo Contrast  09/20/2013   CLINICAL DATA:  Loss of vision.  Left face paresthesias.  EXAM: CT HEAD WITHOUT CONTRAST  TECHNIQUE: Contiguous axial images were obtained from the base of the skull through the vertex without intravenous contrast.  COMPARISON:  None.  FINDINGS: Skull and Sinuses:No significant abnormality.  Orbits: No acute abnormality.  Brain: There is an abnormal area of low-attenuation involving the posterior right frontal lobe cortex and subcortical white matter. There is no clear mass effect, although the abnormality  is relatively small. No intracranial hemorrhage, hydrocephalus, or shift.  These results were called by telephone at the time of interpretation on 09/20/2013 at 1:46 AM to Dr. Teressa Lower , who verbally acknowledged these results.  IMPRESSION: Cortical and subcortical abnormality in the posterior right frontal lobe, subacute infarct versus mass. Brain MRI with contrast recommended.   Electronically Signed   By: Jorje Guild M.D.   On: 09/20/2013 01:46   Mr Abdomen W Wo Contrast  09/18/2013   CLINICAL DATA:  Follow-up focal nodular hyperplasia up. Continue abdominal pain.  EXAM: MRI ABDOMEN WITHOUT AND WITH CONTRAST  TECHNIQUE: Multiplanar multisequence MR imaging of the abdomen was performed both before and after the administration of intravenous contrast.  CONTRAST:  6 mL Eovist  COMPARISON:  MR ABDOMEN WO/W CM dated 02/08/2013; MR ABDOMEN WO/W CM dated 08/16/2012  FINDINGS: The largest lesion in the right hepatic lobe is well-circumscribed measuring 24 x 18 mm compared to 23 x 18 mm on MRI of 07/20/2012. The top the liver is excluded on the early contrast images but the most inferior portion of the lesion is present measuring 10 mm (image 1, series 8) compared to 10 mm on prior. In the left lateral hepatic lobe, small 6 mm lesion compares to 6 mm on prior. These lesions do demonstrate enhancement characteristics consistent with focal nodular  hyperplasia. On the delayed 20 min sequences, the largest lesion accumulates the hepatocyte specific agent. The more superior lesion the right hepatic lobe (image 17 from series 20) is slightly hypointense. . The lesion in the left lateral hepatic lobe is minimally hyperintense (image 40). These enhancing characteristics are similar to the more recent Eovist scan.  No biliary duct dilatation. The gallbladder is normal. The common bile duct is normal. The pancreas, spleen, adrenal glands, and kidneys are normal.  The stomach limited view of the bowel are unremarkable. No abdominal lymphadenopathy.  IMPRESSION: 1. 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 2. No biliary abnormality or pancreatic abnormality.   Electronically Signed   By: Suzy Bouchard M.D.   On: 09/18/2013 11:12     EKG Interpretation   Date/Time:  Thursday September 19 2013 23:55:37 EST Ventricular Rate:  83 PR Interval:  141 QRS Duration: 100 QT Interval:  378 QTC Calculation: 444 R Axis:   76 Text Interpretation:  Sinus rhythm Nonspecific ST abnormality No  significant change since last tracing Confirmed by Yehuda Printup  MD, Lockie Bothun  [id number]) on 09/20/2013 12:04:19 AM     12:55 AM NEU consulted, Dr Leonel Ramsay evaluated bedside and reviewed Ct brain - plan admit for further work up.  [facility] hospitalist admit MDM   Final diagnoses:  Paresthesias/numbness   Adult female presenting with left face numbness and 4 days of vision complaints  Evaluated with EKG, labs, CT brain as above - abnormal CT scan  Neurology consult/  medicine admit   Teressa Lower, MD 09/20/13 [phone]

## 2013-09-20 ENCOUNTER — Encounter (HOSPITAL_COMMUNITY): Payer: Self-pay | Admitting: Radiology

## 2013-09-20 ENCOUNTER — Emergency Department (HOSPITAL_COMMUNITY): Payer: Medicaid Other

## 2013-09-20 DIAGNOSIS — H538 Other visual disturbances: Secondary | ICD-10-CM | POA: Diagnosis present

## 2013-09-20 DIAGNOSIS — R202 Paresthesia of skin: Secondary | ICD-10-CM | POA: Diagnosis present

## 2013-09-20 DIAGNOSIS — R209 Unspecified disturbances of skin sensation: Secondary | ICD-10-CM

## 2013-09-20 LAB — CBC
HCT: 38.7 % (ref 36.0–46.0)
Hemoglobin: 13.6 g/dL (ref 12.0–15.0)
MCH: 32.2 pg (ref 26.0–34.0)
MCHC: 35.1 g/dL (ref 30.0–36.0)
MCV: 91.5 fL (ref 78.0–100.0)
Platelets: 262 10*3/uL (ref 150–400)
RBC: 4.23 MIL/uL (ref 3.87–5.11)
RDW: 11.4 % — ABNORMAL LOW (ref 11.5–15.5)
WBC: 6.6 10*3/uL (ref 4.0–10.5)

## 2013-09-20 LAB — CBC WITH DIFFERENTIAL/PLATELET
Basophils Absolute: 0 10*3/uL (ref 0.0–0.1)
Basophils Relative: 0 % (ref 0–1)
Eosinophils Absolute: 0.1 10*3/uL (ref 0.0–0.7)
Eosinophils Relative: 1 % (ref 0–5)
HCT: 36.6 % (ref 36.0–46.0)
Hemoglobin: 12.7 g/dL (ref 12.0–15.0)
Lymphocytes Relative: 31 % (ref 12–46)
Lymphs Abs: 2.1 10*3/uL (ref 0.7–4.0)
MCH: 31.5 pg (ref 26.0–34.0)
MCHC: 34.7 g/dL (ref 30.0–36.0)
MCV: 90.8 fL (ref 78.0–100.0)
Monocytes Absolute: 0.5 10*3/uL (ref 0.1–1.0)
Monocytes Relative: 7 % (ref 3–12)
Neutro Abs: 4.2 10*3/uL (ref 1.7–7.7)
Neutrophils Relative %: 61 % (ref 43–77)
Platelets: 234 10*3/uL (ref 150–400)
RBC: 4.03 MIL/uL (ref 3.87–5.11)
RDW: 11.5 % (ref 11.5–15.5)
WBC: 6.9 10*3/uL (ref 4.0–10.5)

## 2013-09-20 LAB — LIPID PANEL
Cholesterol: 166 mg/dL (ref 0–200)
HDL: 77 mg/dL (ref >39)
LDL Cholesterol: 78 mg/dL (ref 0–99)
Total CHOL/HDL Ratio: 2.2 RATIO
Triglycerides: 57 mg/dL (ref <150)
VLDL: 11 mg/dL (ref 0–40)

## 2013-09-20 LAB — I-STAT CHEM 8, ED
BUN: 5 mg/dL — ABNORMAL LOW (ref 6–23)
Calcium, Ion: 1.2 mmol/L (ref 1.12–1.23)
Chloride: 100 mEq/L (ref 96–112)
Creatinine, Ser: 0.7 mg/dL (ref 0.50–1.10)
Glucose, Bld: 87 mg/dL (ref 70–99)
HCT: 43 % (ref 36.0–46.0)
Hemoglobin: 14.6 g/dL (ref 12.0–15.0)
Potassium: 3.3 mEq/L — ABNORMAL LOW (ref 3.7–5.3)
Sodium: 141 mEq/L (ref 137–147)
TCO2: 26 mmol/L (ref 0–100)

## 2013-09-20 LAB — C-REACTIVE PROTEIN
CRP: <0.5 mg/dL — ABNORMAL LOW (ref <0.60)
CRP: <0.5 mg/dL — ABNORMAL LOW (ref <0.60)

## 2013-09-20 LAB — COMPREHENSIVE METABOLIC PANEL
ALT: 8 U/L (ref 0–35)
AST: 12 U/L (ref 0–37)
Albumin: 3.9 g/dL (ref 3.5–5.2)
Alkaline Phosphatase: 40 U/L (ref 39–117)
BUN: 6 mg/dL (ref 6–23)
CO2: 23 mEq/L (ref 19–32)
Calcium: 9.5 mg/dL (ref 8.4–10.5)
Chloride: 100 mEq/L (ref 96–112)
Creatinine, Ser: 0.58 mg/dL (ref 0.50–1.10)
GFR calc Af Amer: >90 mL/min (ref >90)
GFR calc non Af Amer: >90 mL/min (ref >90)
Glucose, Bld: 87 mg/dL (ref 70–99)
Potassium: 3.6 mEq/L — ABNORMAL LOW (ref 3.7–5.3)
Sodium: 138 mEq/L (ref 137–147)
Total Bilirubin: 0.7 mg/dL (ref 0.3–1.2)
Total Protein: 6.9 g/dL (ref 6.0–8.3)

## 2013-09-20 LAB — SEDIMENTATION RATE
Sed Rate: 2 mm/hr (ref 0–22)
Sed Rate: 1 mm/hr (ref 0–22)

## 2013-09-20 LAB — HEMOGLOBIN A1C
Hgb A1c MFr Bld: 5.1 % (ref <5.7)
Mean Plasma Glucose: 100 mg/dL (ref <117)

## 2013-09-20 LAB — TSH: TSH: 2.016 u[IU]/mL (ref 0.350–4.500)

## 2013-09-20 MED ORDER — CARISOPRODOL 350 MG PO TABS
350.0000 mg | ORAL_TABLET | Freq: Three times a day (TID) | ORAL | Status: DC | PRN
  Administered 2013-09-20 – 2013-09-21 (×4): 350 mg via ORAL
  Filled 2013-09-20 (×4): qty 1

## 2013-09-20 MED ORDER — ASPIRIN 81 MG PO TABS: 81.0000 mg | ORAL_TABLET | Freq: Every day | ORAL | Status: DC

## 2013-09-20 MED ORDER — SODIUM CHLORIDE 0.9 % IV SOLN: INTRAVENOUS | Status: DC

## 2013-09-20 MED ORDER — ACETAMINOPHEN 325 MG PO TABS
650.0000 mg | ORAL_TABLET | ORAL | Status: DC | PRN
  Administered 2013-09-20 – 2013-09-21 (×4): 650 mg via ORAL
  Filled 2013-09-20 (×4): qty 2

## 2013-09-20 MED ORDER — ALPRAZOLAM 0.5 MG PO TABS
0.5000 mg | ORAL_TABLET | Freq: Three times a day (TID) | ORAL | Status: DC | PRN
  Administered 2013-09-20: 0.5 mg via ORAL
  Administered 2013-09-21: 1 mg via ORAL
  Filled 2013-09-20: qty 2
  Filled 2013-09-20: qty 1
  Filled 2013-09-20: qty 2

## 2013-09-20 MED ORDER — PROMETHAZINE HCL 25 MG PO TABS
12.5000 mg | ORAL_TABLET | ORAL | Status: DC | PRN
Start: 2013-09-20 — End: 2013-09-21
  Administered 2013-09-20: 12.5 mg via ORAL
  Filled 2013-09-20: qty 1

## 2013-09-20 MED ORDER — ASPIRIN 325 MG PO TABS
325.0000 mg | ORAL_TABLET | Freq: Every day | ORAL | Status: DC
  Administered 2013-09-20 – 2013-09-21 (×2): 325 mg via ORAL
  Filled 2013-09-20 (×3): qty 1

## 2013-09-20 MED ORDER — ALBUTEROL SULFATE (2.5 MG/3ML) 0.083% IN NEBU: 3.0000 mL | INHALATION_SOLUTION | Freq: Four times a day (QID) | RESPIRATORY_TRACT | Status: DC | PRN

## 2013-09-20 NOTE — ED Notes (Signed)
Neurologist at bedside. 

## 2013-09-20 NOTE — Progress Notes (Signed)
Subjective: L face and l arm numb  Objective: Vital signs in last 24 hours: Temp:  [97.8 F (36.6 C)-98.2 F (36.8 C)] 98 F (36.7 C) (03/06 0517) Pulse Rate:  [75-105] 83 (03/06 0517) Resp:  [13-18] 18 (03/06 0517) BP: (114-130)/(61-98) 114/61 mmHg (03/06 0517) SpO2:  [98 %-100 %] 100 % (03/06 0517) Weight:  [64.496 kg (142 lb 3 oz)] 64.496 kg (142 lb 3 oz) (03/05 2323) Weight change:  Last BM Date: 09/18/13  Intake/Output from previous day:   Intake/Output this shift:    General appearance: alert and cooperative Neurologic: Sensory: normal, light touch intact in face Motor: 5+ both arms /  Lab Results:  Recent Labs  09/19/13 2354 09/20/13 0005 09/20/13 0455  WBC 6.6  --  6.9  HGB 13.6 14.6 12.7  HCT 38.7 43.0 36.6  PLT 262  --  234   BMET  Recent Labs  09/20/13 0005 09/20/13 0455  NA 141 138  K 3.3* 3.6*  CL 100 100  CO2  --  23  GLUCOSE 87 87  BUN 5* 6  CREATININE 0.70 0.58  CALCIUM  --  9.5    Studies/Results: Ct Head Wo Contrast  09/20/2013   CLINICAL DATA:  Loss of vision.  Left face paresthesias.  EXAM: CT HEAD WITHOUT CONTRAST  TECHNIQUE: Contiguous axial images were obtained from the base of the skull through the vertex without intravenous contrast.  COMPARISON:  None.  FINDINGS: Skull and Sinuses:No significant abnormality.  Orbits: No acute abnormality.  Brain: There is an abnormal area of low-attenuation involving the posterior right frontal lobe cortex and subcortical white matter. There is no clear mass effect, although the abnormality is relatively small. No intracranial hemorrhage, hydrocephalus, or shift.  These results were called by telephone at the time of interpretation on 09/20/2013 at 1:46 AM to Dr. Teressa Lower , who verbally acknowledged these results.  IMPRESSION: Cortical and subcortical abnormality in the posterior right frontal lobe, subacute infarct versus mass. Brain MRI with contrast recommended.   Electronically Signed   By:  Jorje Guild M.D.   On: 09/20/2013 01:46   Mr Abdomen W Wo Contrast  09/18/2013   CLINICAL DATA:  Follow-up focal nodular hyperplasia up. Continue abdominal pain.  EXAM: MRI ABDOMEN WITHOUT AND WITH CONTRAST  TECHNIQUE: Multiplanar multisequence MR imaging of the abdomen was performed both before and after the administration of intravenous contrast.  CONTRAST:  6 mL Eovist  COMPARISON:  MR ABDOMEN WO/W CM dated 02/08/2013; MR ABDOMEN WO/W CM dated 08/16/2012  FINDINGS: The largest lesion in the right hepatic lobe is well-circumscribed measuring 24 x 18 mm compared to 23 x 18 mm on MRI of 07/20/2012. The top the liver is excluded on the early contrast images but the most inferior portion of the lesion is present measuring 10 mm (image 1, series 8) compared to 10 mm on prior. In the left lateral hepatic lobe, small 6 mm lesion compares to 6 mm on prior. These lesions do demonstrate enhancement characteristics consistent with focal nodular hyperplasia. On the delayed 20 min sequences, the largest lesion accumulates the hepatocyte specific agent. The more superior lesion the right hepatic lobe (image 17 from series 20) is slightly hypointense. . The lesion in the left lateral hepatic lobe is minimally hyperintense (image 40). These enhancing characteristics are similar to the more recent Eovist scan.  No biliary duct dilatation. The gallbladder is normal. The common bile duct is normal. The pancreas, spleen, adrenal glands, and kidneys are normal.  The stomach limited view of the bowel are unremarkable. No abdominal lymphadenopathy.  IMPRESSION: 1. 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 2. No biliary abnormality or pancreatic abnormality.   Electronically Signed   By: Suzy Bouchard M.D.   On: 09/18/2013 11:12    Medications: I have reviewed the patient's current medications.  Assessment/Plan: Principal Problem:   Blurred vision, left eye  MRI of brain and orbits today.  On  Aspirin Active Problems:   Paresthesias   LOS: 1 day   Abagayle Klutts JOSEPH 09/20/2013, 6:57 AM

## 2013-09-20 NOTE — Consult Note (Signed)
Neurology Consultation Reason for Consult: Left facial numbness Referring Physician: Marnette Burgess, B  CC: Left eye cloudy vision  History is obtained from: Patient  HPI: Victoria Bates is a 33 y.o. female with a history of hypertension, gastritis who presents with 4 days of blurred vision out of the left eye. She states that it appears as if she is "looking through clouds" and notices different color to objects in her left eye compared to the right.  Tonight, she noticed that the left side of her face appeared different than the right and therefore presented to the emergency room for evaluation.  She saw ophthalmologist yesterday who apparently had a normal exam, and had recommended an MRI of her orbit for evaluation of optic neuritis.  She has been having Raynaud's phenomenon for several months, and has a history of gastritis as well as new onset severe hypertension last year.     LKW: 09/16/13 tpa given?: no, outside of window    ROS: A 14 point ROS was performed and is negative except as noted in the HPI.  Past Medical History  Diagnosis Date  . Liver hemangioma   . Hypertension   . Anemia   . Headache(784.0)     migraines  . Anxiety   . Asthma     very mild- no inhaler use since July 2013  . Complication of anesthesia     takes longer for patient to go under anesthesia  . PONV (postoperative nausea and vomiting)   . SVD (spontaneous vaginal delivery)     x 1    Family History: No history of autoimmune disease  Social History: Tob: Denies  Exam: Current vital signs: BP 124/94  Pulse 105  Temp(Src) 98.2 F (36.8 C) (Oral)  Resp 13  Ht _0  (1.727 m)  Wt 64.496 kg (142 lb 3 oz)  BMI 21.62 kg/m2  SpO2 98%  LMP 09/20/2012 Vital signs in last 24 hours: Temp:  [98.2 F (36.8 C)] 98.2 F (36.8 C) (03/05 2323) Pulse Rate:  [105] 105 (03/05 2323) Resp:  [13] 13 (03/05 2323) BP: (124)/(94) 124/94 mmHg (03/05 2323) SpO2:  [98 %] 98 % (03/05 2323) Weight:  [64.496  kg (142 lb 3 oz)] 64.496 kg (142 lb 3 oz) (03/05 2323)  General: In bed, NAD CV: Regular rate and rhythm Mental Status: Patient is awake, alert, oriented to person, place, month, year, and situation. Immediate and remote memory are intact. Patient is able to give a clear and coherent history. No signs of aphasia or neglect Cranial Nerves: II: Visual Fields are full. Pupils are equal, round, and reactive to light.  There is no afferent pupillary defect, and the discs are without papilledema III,IV, VI: EOMI without ptosis or diploplia.  V: Facial sensation is decreased on the left to temperature VII: Facial movement is possibly mildly decreased nasolabial fold on the left VIII: hearing is intact to voice X: Uvula elevates symmetrically XI: Shoulder shrug is symmetric. XII: tongue is midline without atrophy or fasciculations.  Motor: Tone is normal. Bulk is normal. 5/5 strength was present in all four extremities.  Sensory: Sensation is symmetric to light touch and temperature in the arms and legs. Deep Tendon Reflexes: 2+ and symmetric in the biceps and patellae.  Plantars: Toes are downgoing bilaterally.  Cerebellar: FNF intact bilaterally Gait: Not assessed due to multiple medical monitors in ED setting.   I have reviewed labs in epic and the results pertinent to this consultation are: CBC - unremarkable  She recently had an outside autoimmune screening with  Anti-DNA Smith antigen Scl 70 SSA SSB Anti-chromatin Jo-1 Centromere B  Which were all negative  RNP mildly elevated    I have reviewed the images obtained:CT head - there is a hypodensity in teh right frontal region.   Impression: 33 yo F with left vision changes of four days duration and now facial numbness/?weakness that started tonight. She has a hypodensity on the CT scan that I feel needs to be better evaluated with MRI. It appears to involve cortex which could be suggestive of stroke, but with other  symptoms, autoimmune disease or multiple sclerosis may need to be considered.   Recommendations: 1) ESR, CRP 2) Stroke workup if MRI confirms stroke 3) ASA 350m qday 4) frequent neuro checks 5) MRI brain w/wo contrast with thin cuts through optic nerve.  6) will continue to follow.    MRoland Rack MD [facility] [phone] If 7pm- 7am, please page neurology on call at [phone]

## 2013-09-20 NOTE — Progress Notes (Signed)
Pt arrived via ED stretcher and ED tech. Alert and oriented to room and call bell. Pt states she really doesn't want to be here but I guess I need to be. Visibly annoyed with her admission. Left side of face still numb and sensitivity to light changes. Pt refused IV fluids and is now resting. Will continue to monitor.

## 2013-09-20 NOTE — H&P (Signed)
[facility] History and Physical  Victoria Bates KZS:[id number] DOB: Jul 25, 1980 DOA: 09/19/2013  Referring physician: ER physician. PCP: [name], MD   Chief Complaint: Left eye vision problem. Left facial heaviness.  HPI: Victoria Bates is a 33 y.o. female with previous history of hypertension presently on no medications and chronic back pain with scoliosis presented to the ER because of persistent left eye vision blurriness with left facial heaviness. Patient has been having left visual blurriness for the last 4 days and has had gone to ophthalmologist 2 days ago and was told to get MRI of the orbits to check for optic neuritis presents to the ER today because patient in addition to her left visual blurriness also started experiencing left facial numbness. Patient did not have any weakness of the upper or lower extremities did not have any difficulty swallowing speaking or any incontinence of urine or bowel. Denies any chest pain or shortness of breath. Patient states her left visual field like feeling something through the cloud. In the ER patient had a CT head which shows abnormality in the left frontal lobe and on-call neurologist has seen the patient and patient will be admitted for further management.   Review of Systems: As presented in the history of presenting illness, rest negative.  Past Medical History  Diagnosis Date  . Liver hemangioma   . Hypertension   . Anemia   . Headache(784.0)     migraines  . Anxiety   . Asthma     very mild- no inhaler use since July 2013  . Complication of anesthesia     takes longer for patient to go under anesthesia  . PONV (postoperative nausea and vomiting)   . SVD (spontaneous vaginal delivery)     x 1   Past Surgical History  Procedure Laterality Date  . Cesarean section    . Coposcopy  08/06/2012    cervical biopsy  . Left knee surgery  2000    torn meniscus  . Right flank surgery  2005    bnign mass from flank  area-right  . Esophagogastroduodenoscopy (egd) with propofol N/A 08/29/2012    Procedure: ESOPHAGOGASTRODUODENOSCOPY (EGD) WITH PROPOFOL;  Surgeon: Arta Silence, MD;  Location: [facility];  Service: Endoscopy;  Laterality: N/A;  . Colonoscopy with propofol N/A 08/29/2012    Procedure: COLONOSCOPY WITH PROPOFOL;  Surgeon: Arta Silence, MD;  Location: [facility];  Service: Endoscopy;  Laterality: N/A;  . Wisdom tooth extraction    . Robotic assisted total hysterectomy with bilateral salpingo oopherectomy Bilateral 09/25/2012    Procedure: ROBOTIC ASSISTED TOTAL HYSTERECTOMY WITH BILATERAL SALPINGO OOPHORECTOMY;  Surgeon: Maeola Sarah. Landry Mellow, MD;  Location: [facility];  Service: Gynecology;  Laterality: Bilateral;   Social History:  reports that she has never smoked. She has never used smokeless tobacco. She reports that she does not drink alcohol or use illicit drugs. Where does patient live home. Can patient participate in ADLs? Yes.  Allergies  Allergen Reactions  . Adhesive [Tape]     Skin tear  . Latex Hives  . Shellfish Allergy Diarrhea and Nausea And Vomiting  . Soma [Carisoprodol]     Watson brand, specifically, causes disorientation    Family History:  Family History  Problem Relation Age of Onset  . Hypertension Mother       Prior to Admission medications   Medication Sig Start Date End Date Taking? Authorizing Provider  acetaminophen (TYLENOL) 500 MG tablet Take 1,000 mg by mouth every 6 (six) hours  as needed for pain.   Yes Historical Provider, MD  albuterol (PROVENTIL HFA;VENTOLIN HFA) 108 (90 BASE) MCG/ACT inhaler Inhale 2 puffs into the lungs every 6 (six) hours as needed for wheezing (in summer time - occasional use only).   Yes Historical Provider, MD  ALPRAZolam [name] 1 MG tablet Take 0.5-1 mg by mouth 3 (three) times daily as needed for anxiety.    Yes Historical Provider, MD  aspirin 81 MG tablet Take 81 mg by mouth daily.   Yes Historical Provider, MD  carisoprodol  (SOMA) 350 MG tablet Take 350 mg by mouth 3 (three) times daily as needed for muscle spasms.   Yes Historical Provider, MD    Physical Exam: Filed Vitals:   09/19/13 2323 09/20/13 0000 09/20/13 0057 09/20/13 0100  BP: 124/94 130/80 123/90 124/98  Pulse: 105 88 83 88  Temp: 98.2 F (36.8 C)     TempSrc: Oral     Resp: 13 16 15 17   Height:      Weight:      SpO2: 98% 100% 100% 99%     General:  Well-developed and nourished.  Eyes: Anicteric no pallor. Patient has left eye blurred vision but able to count fingers. PERRLA positive.  ENT: No discharge from the ears eyes nose mouth.  Neck: No mass felt.  Cardiovascular: S1-S2 heard.  Respiratory: No rhonchi or crepitations.  Abdomen: Soft nontender bowel sounds present. No guarding or rigidity.  Skin: No rash.  Musculoskeletal: No edema.  Psychiatric: Appears normal.  Neurologic: Alert awake oriented to time place and person. Moves all extremities.  Labs on Admission:  Basic Metabolic Panel:  Recent Labs Lab 09/20/13 0005  NA 141  K 3.3*  CL 100  GLUCOSE 87  BUN 5*  CREATININE 0.70   Liver Function Tests: No results found for this basename: AST, ALT, ALKPHOS, BILITOT, PROT, ALBUMIN,  in the last 168 hours No results found for this basename: LIPASE, AMYLASE,  in the last 168 hours No results found for this basename: AMMONIA,  in the last 168 hours CBC:  Recent Labs Lab 09/19/13 2354 09/20/13 0005  WBC 6.6  --   HGB 13.6 14.6  HCT 38.7 43.0  MCV 91.5  --   PLT 262  --    Cardiac Enzymes: No results found for this basename: CKTOTAL, CKMB, CKMBINDEX, TROPONINI,  in the last 168 hours  BNP (last 3 results) No results found for this basename: PROBNP,  in the last 8760 hours CBG: No results found for this basename: GLUCAP,  in the last 168 hours  Radiological Exams on Admission: Ct Head Wo Contrast  09/20/2013   CLINICAL DATA:  Loss of vision.  Left face paresthesias.  EXAM: CT HEAD WITHOUT CONTRAST   TECHNIQUE: Contiguous axial images were obtained from the base of the skull through the vertex without intravenous contrast.  COMPARISON:  None.  FINDINGS: Skull and Sinuses:No significant abnormality.  Orbits: No acute abnormality.  Brain: There is an abnormal area of low-attenuation involving the posterior right frontal lobe cortex and subcortical white matter. There is no clear mass effect, although the abnormality is relatively small. No intracranial hemorrhage, hydrocephalus, or shift.  These results were called by telephone at the time of interpretation on 09/20/2013 at 1:46 AM to Dr. Teressa Lower , who verbally acknowledged these results.  IMPRESSION: Cortical and subcortical abnormality in the posterior right frontal lobe, subacute infarct versus mass. Brain MRI with contrast recommended.   Electronically Signed   By:  Jorje Guild M.D.   On: 09/20/2013 01:46   Mr Abdomen W Wo Contrast  09/18/2013   CLINICAL DATA:  Follow-up focal nodular hyperplasia up. Continue abdominal pain.  EXAM: MRI ABDOMEN WITHOUT AND WITH CONTRAST  TECHNIQUE: Multiplanar multisequence MR imaging of the abdomen was performed both before and after the administration of intravenous contrast.  CONTRAST:  6 mL Eovist  COMPARISON:  MR ABDOMEN WO/W CM dated 02/08/2013; MR ABDOMEN WO/W CM dated 08/16/2012  FINDINGS: The largest lesion in the right hepatic lobe is well-circumscribed measuring 24 x 18 mm compared to 23 x 18 mm on MRI of 07/20/2012. The top the liver is excluded on the early contrast images but the most inferior portion of the lesion is present measuring 10 mm (image 1, series 8) compared to 10 mm on prior. In the left lateral hepatic lobe, small 6 mm lesion compares to 6 mm on prior. These lesions do demonstrate enhancement characteristics consistent with focal nodular hyperplasia. On the delayed 20 min sequences, the largest lesion accumulates the hepatocyte specific agent. The more superior lesion the right hepatic lobe  (image 17 from series 20) is slightly hypointense. . The lesion in the left lateral hepatic lobe is minimally hyperintense (image 40). These enhancing characteristics are similar to the more recent Eovist scan.  No biliary duct dilatation. The gallbladder is normal. The common bile duct is normal. The pancreas, spleen, adrenal glands, and kidneys are normal.  The stomach limited view of the bowel are unremarkable. No abdominal lymphadenopathy.  IMPRESSION: 1. 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 2. No biliary abnormality or pancreatic abnormality.   Electronically Signed   By: Suzy Bouchard M.D.   On: 09/18/2013 11:12     Assessment/Plan Principal Problem:   Blurred vision, left eye Active Problems:   Paresthesias   1. Left eye blurred vision with left facial heaviness - at this time neurologist has recommended to get MRI brain with and without contrast with thin slices over optic nerve. Patient will be placed on neurochecks and swallow evaluation and further recommendations based on patient's MRI results. In addition C-reactive protein and sedimentation rate has been ordered. 2. History of elevated blood pressure - presently patient is not on any antihypertensives. Closely observe blood pressure trends. 3. History of back pain - continue present medications.    Code Status: Full code.  Family Communication: None.  Disposition Plan: Admit for observation.    Kamya Watling N. [facility] Pager [phone].  If 7PM-7AM, please contact night-coverage [email or web address] Password [email or web address] 09/20/2013, 2:10 AM

## 2013-09-20 NOTE — Progress Notes (Signed)
Received report from [place] in ED at 0141. Room ready and awaiting arrival of pt to 4N07.

## 2013-09-21 ENCOUNTER — Inpatient Hospital Stay (HOSPITAL_COMMUNITY): Payer: Medicaid Other

## 2013-09-21 MED ORDER — LEVETIRACETAM 500 MG PO TABS: 500.0000 mg | ORAL_TABLET | Freq: Two times a day (BID) | ORAL | Status: DC

## 2013-09-21 MED ORDER — GADOBENATE DIMEGLUMINE 529 MG/ML IV SOLN
15.0000 mL | Freq: Once | INTRAVENOUS | Status: AC | PRN
  Administered 2013-09-21: 15 mL via INTRAVENOUS

## 2013-09-21 NOTE — Progress Notes (Addendum)
Subjective: No new complaints.  Objective: Vital signs in last 24 hours: Temp:  [97.4 F (36.3 C)-99 F (37.2 C)] 97.4 F (36.3 C) (03/07 0941) Pulse Rate:  [61-92] 80 (03/07 0941) Resp:  [16-18] 16 (03/07 0941) BP: (105-119)/(64-81) 110/76 mmHg (03/07 0941) SpO2:  [95 %-100 %] 96 % (03/07 0941) Weight change:  Last BM Date: 09/20/13  Intake/Output from previous day: 03/06 0701 - 03/07 0700 In: 120 [P.O.:120] Out: -  Intake/Output this shift:    General appearance: alert and cooperative  Lab Results:  Recent Labs  09/19/13 2354 09/20/13 0005 09/20/13 0455  WBC 6.6  --  6.9  HGB 13.6 14.6 12.7  HCT 38.7 43.0 36.6  PLT 262  --  234   BMET  Recent Labs  09/20/13 0005 09/20/13 0455  NA 141 138  K 3.3* 3.6*  CL 100 100  CO2  --  23  GLUCOSE 87 87  BUN 5* 6  CREATININE 0.70 0.58  CALCIUM  --  9.5    Studies/Results: Ct Head Wo Contrast  09/20/2013   CLINICAL DATA:  Loss of vision.  Left face paresthesias.  EXAM: CT HEAD WITHOUT CONTRAST  TECHNIQUE: Contiguous axial images were obtained from the base of the skull through the vertex without intravenous contrast.  COMPARISON:  None.  FINDINGS: Skull and Sinuses:No significant abnormality.  Orbits: No acute abnormality.  Brain: There is an abnormal area of low-attenuation involving the posterior right frontal lobe cortex and subcortical white matter. There is no clear mass effect, although the abnormality is relatively small. No intracranial hemorrhage, hydrocephalus, or shift.  These results were called by telephone at the time of interpretation on 09/20/2013 at 1:46 AM to Dr. Teressa Lower , who verbally acknowledged these results.  IMPRESSION: Cortical and subcortical abnormality in the posterior right frontal lobe, subacute infarct versus mass. Brain MRI with contrast recommended.   Electronically Signed   By: Jorje Guild M.D.   On: 09/20/2013 01:46   Mr Jodene Nam Head Wo Contrast  09/21/2013   CLINICAL DATA:  Left facial  numbness.  EXAM: MRI HEAD WITHOUT CONTRAST  MRA HEAD WITHOUT CONTRAST  TECHNIQUE: Multiplanar, multiecho pulse sequences of the brain and surrounding structures were obtained without intravenous contrast. Angiographic images of the head were obtained using MRA technique without contrast.  COMPARISON:  Head CT 09/20/2013  FINDINGS: MRI HEAD FINDINGS  There is no evidence of acute infarct. Corresponding to the region of hypodensity on CT is abnormal T2 hyperintensity involving cortex and subcortical white matter in the posterior right frontal lobe. There is mild gyral expansion. There is no abnormal enhancement. There is no evidence of intracranial hemorrhage.  Slightly prominent perivascular spaces are noted in the basal ganglia bilaterally. The remainder of the brain parenchyma is normal in appearance. Cerebral volume is normal for age. There is no midline shift or extra-axial fluid collection. Orbits are unremarkable. Paranasal sinuses and mastoid air cells are clear.  MRA HEAD FINDINGS  The visualized distal vertebral arteries are patent with the left being minimally larger than the right. Left PICA origin is unremarkable. Right PICA origin is not identified. 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 SCA origins are patent. Basilar artery is patent without stenosis. PCAs are unremarkable. There are small bilateral posterior communicating arteries.  Internal carotid arteries are patent from skullbase to carotid termini. ACAS and MCAs are unremarkable. No intracranial aneurysm is identified.  IMPRESSION: 1. Nonenhancing, cortical/subcortical T2 hyperintensity in the posterior right frontal  lobe. This is most concerning for neoplasm such as low-grade glioma. This does not appear to represent acute or early subacute infarction. 2. Unremarkable head MRA.   Electronically Signed   By: Logan Bores   On: 09/21/2013 09:55   Mr Brain W Wo Contrast  09/21/2013   CLINICAL DATA:  Left facial  numbness.  EXAM: MRI HEAD WITHOUT CONTRAST  MRA HEAD WITHOUT CONTRAST  TECHNIQUE: Multiplanar, multiecho pulse sequences of the brain and surrounding structures were obtained without intravenous contrast. Angiographic images of the head were obtained using MRA technique without contrast.  COMPARISON:  Head CT 09/20/2013  FINDINGS: MRI HEAD FINDINGS  There is no evidence of acute infarct. Corresponding to the region of hypodensity on CT is abnormal T2 hyperintensity involving cortex and subcortical white matter in the posterior right frontal lobe. There is mild gyral expansion. There is no abnormal enhancement. There is no evidence of intracranial hemorrhage.  Slightly prominent perivascular spaces are noted in the basal ganglia bilaterally. The remainder of the brain parenchyma is normal in appearance. Cerebral volume is normal for age. There is no midline shift or extra-axial fluid collection. Orbits are unremarkable. Paranasal sinuses and mastoid air cells are clear.  MRA HEAD FINDINGS  The visualized distal vertebral arteries are patent with the left being minimally larger than the right. Left PICA origin is unremarkable. Right PICA origin is not identified. 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 SCA origins are patent. Basilar artery is patent without stenosis. PCAs are unremarkable. There are small bilateral posterior communicating arteries.  Internal carotid arteries are patent from skullbase to carotid termini. ACAS and MCAs are unremarkable. No intracranial aneurysm is identified.  IMPRESSION: 1. Nonenhancing, cortical/subcortical T2 hyperintensity in the posterior right frontal lobe. This is most concerning for neoplasm such as low-grade glioma. This does not appear to represent acute or early subacute infarction. 2. Unremarkable head MRA.   Electronically Signed   By: Logan Bores   On: 09/21/2013 09:55    Medications: I have reviewed the patient's current  medications.  Assessment/Plan: Principal Problem:  Visual Disturbance, optic neuritis?, MRI today does not show findings c/w MS, thin cuts through the optic nerves were not done. 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 Possible discharge later today.   LOS: 2 days   [facility]  Cell #  [phone] 09/21/2013, 11:13 AM

## 2013-09-21 NOTE — Consult Note (Signed)
Reason for Consult:abnormal brain on MRI Referring Physician: [place]aclara, Spear is an 33 y.o. female.  HPI: whom earlier this week describes feeling numb on the left side of her face, feeling as if she is looking through a fog in the left eye. She was not having headaches. She was noted by a coworker to have a left facial droop on the day of admission and Victoria Bates describes it a Heaviness in her face. A ct showed a hypodensity in the right frontal lobe, MRI shows signal abnormalities in the right frontal lobe, fat gyri, indistinct sulci. I am consulted for recommendations about the abnormality.   Past Medical History  Diagnosis Date  . Liver hemangioma   . Hypertension   . Anemia   . Headache(784.0)     migraines  . Anxiety   . Asthma     very mild- no inhaler use since July 2013  . Complication of anesthesia     takes longer for patient to go under anesthesia  . PONV (postoperative nausea and vomiting)   . SVD (spontaneous vaginal delivery)     x 1    Past Surgical History  Procedure Laterality Date  . Cesarean section    . Coposcopy  08/06/2012    cervical biopsy  . Left knee surgery  2000    torn meniscus  . Right flank surgery  2005    bnign mass from flank area-right  . Esophagogastroduodenoscopy (egd) with propofol N/A 08/29/2012    Procedure: ESOPHAGOGASTRODUODENOSCOPY (EGD) WITH PROPOFOL;  Surgeon: Arta Silence, MD;  Location: [facility];  Service: Endoscopy;  Laterality: N/A;  . Colonoscopy with propofol N/A 08/29/2012    Procedure: COLONOSCOPY WITH PROPOFOL;  Surgeon: Arta Silence, MD;  Location: [facility];  Service: Endoscopy;  Laterality: N/A;  . Wisdom tooth extraction    . Robotic assisted total hysterectomy with bilateral salpingo oopherectomy Bilateral 09/25/2012    Procedure: ROBOTIC ASSISTED TOTAL HYSTERECTOMY WITH BILATERAL SALPINGO OOPHORECTOMY;  Surgeon: Maeola Sarah. Landry Mellow, MD;  Location: [facility];  Service: Gynecology;  Laterality: Bilateral;     Family History  Problem Relation Age of Onset  . Hypertension Mother     Social History:  reports that she has never smoked. She has never used smokeless tobacco. She reports that she does not drink alcohol or use illicit drugs.  Allergies:  Allergies  Allergen Reactions  . Adhesive [Tape]     Skin tear  . Latex Hives  . Shellfish Allergy Diarrhea and Nausea And Vomiting  . Soma [Carisoprodol]     Watson brand, specifically, causes disorientation    Medications: I have reviewed the patient's current medications.  Results for orders placed during the hospital encounter of 09/19/13 (from the past 48 hour(s))  CBC     Status: Abnormal   Collection Time    09/19/13 11:54 PM      Result Value Ref Range   WBC 6.6  4.0 - 10.5 K/uL   RBC 4.23  3.87 - 5.11 MIL/uL   Hemoglobin 13.6  12.0 - 15.0 g/dL   HCT 38.7  36.0 - 46.0 %   MCV 91.5  78.0 - 100.0 fL   MCH 32.2  26.0 - 34.0 pg   MCHC 35.1  30.0 - 36.0 g/dL   RDW 11.4 (*) 11.5 - 15.5 %   Platelets 262  150 - 400 K/uL  I-STAT CHEM 8, ED     Status: Abnormal   Collection Time    09/20/13  12:05 AM      Result Value Ref Range   Sodium 141  137 - 147 mEq/L   Potassium 3.3 (*) 3.7 - 5.3 mEq/L   Chloride 100  96 - 112 mEq/L   BUN 5 (*) 6 - 23 mg/dL   Creatinine, Ser 0.70  0.50 - 1.10 mg/dL   Glucose, Bld 87  70 - 99 mg/dL   Calcium, Ion 1.20  1.12 - 1.23 mmol/L   TCO2 26  0 - 100 mmol/L   Hemoglobin 14.6  12.0 - 15.0 g/dL   HCT 43.0  36.0 - 46.0 %  C-REACTIVE PROTEIN     Status: Abnormal   Collection Time    09/20/13  4:17 AM      Result Value Ref Range   CRP <0.5 (*) <0.60 mg/dL   Comment: Performed at [facility]     Status: None   Collection Time    09/20/13  4:17 AM      Result Value Ref Range   Sed Rate 2  0 - 22 mm/hr  TSH     Status: None   Collection Time    09/20/13  4:17 AM      Result Value Ref Range   TSH 2.016  0.350 - 4.500 uIU/mL   Comment: Performed at [facility]     Status: None   Collection Time    09/20/13  4:17 AM      Result Value Ref Range   Hemoglobin A1C 5.1  <5.7 %   Comment: (NOTE)                                                                               According to the ADA Clinical Practice Recommendations for 2011, when     HbA1c is used as a screening test:      >=6.5%   Diagnostic of Diabetes Mellitus               (if abnormal result is confirmed)     5.7-6.4%   Increased risk of developing Diabetes Mellitus     References:Diagnosis and Classification of Diabetes Mellitus,Diabetes     WLSL,3734,28(JGOTL 1):S62-S69 and Standards of Medical Care in             Diabetes - 2011,Diabetes Care,2011,34 (Suppl 1):S11-S61.   Mean Plasma Glucose 100  <117 mg/dL   Comment: Performed at [facility]     Status: None   Collection Time    09/20/13  4:50 AM      Result Value Ref Range   Cholesterol 166  0 - 200 mg/dL   Triglycerides 57  <150 mg/dL   HDL 77  >39 mg/dL   Total CHOL/HDL Ratio 2.2     VLDL 11  0 - 40 mg/dL   LDL Cholesterol 78  0 - 99 mg/dL   Comment:            Total Cholesterol/HDL:CHD Risk     Coronary Heart Disease Risk Table  Men   Women      1/2 Average Risk   3.4   3.3      Average Risk       5.0   4.4      2 X Average Risk   9.6   7.1      3 X Average Risk  23.4   11.0                Use the calculated Patient Ratio     above and the CHD Risk Table     to determine the patient's CHD Risk.                ATP III CLASSIFICATION (LDL):      <100     mg/dL   Optimal      100-129  mg/dL   Near or Above                        Optimal      130-159  mg/dL   Borderline      160-189  mg/dL   High      >190     mg/dL   Very High  COMPREHENSIVE METABOLIC PANEL     Status: Abnormal   Collection Time    09/20/13  4:55 AM      Result Value Ref Range   Sodium 138  137 - 147 mEq/L   Potassium 3.6 (*) 3.7 - 5.3 mEq/L   Chloride 100  96 - 112  mEq/L   CO2 23  19 - 32 mEq/L   Glucose, Bld 87  70 - 99 mg/dL   BUN 6  6 - 23 mg/dL   Creatinine, Ser 0.58  0.50 - 1.10 mg/dL   Calcium 9.5  8.4 - 10.5 mg/dL   Total Protein 6.9  6.0 - 8.3 g/dL   Albumin 3.9  3.5 - 5.2 g/dL   AST 12  0 - 37 U/L   ALT 8  0 - 35 U/L   Alkaline Phosphatase 40  39 - 117 U/L   Total Bilirubin 0.7  0.3 - 1.2 mg/dL   GFR calc non Af Amer >90  >90 mL/min   GFR calc Af Amer >90  >90 mL/min   Comment: (NOTE)     The eGFR has been calculated using the CKD EPI equation.     This calculation has not been validated in all clinical situations.     eGFR's persistently <90 mL/min signify possible Chronic Kidney     Disease.  CBC WITH DIFFERENTIAL     Status: None   Collection Time    09/20/13  4:55 AM      Result Value Ref Range   WBC 6.9  4.0 - 10.5 K/uL   RBC 4.03  3.87 - 5.11 MIL/uL   Hemoglobin 12.7  12.0 - 15.0 g/dL   HCT 36.6  36.0 - 46.0 %   MCV 90.8  78.0 - 100.0 fL   MCH 31.5  26.0 - 34.0 pg   MCHC 34.7  30.0 - 36.0 g/dL   RDW 11.5  11.5 - 15.5 %   Platelets 234  150 - 400 K/uL   Neutrophils Relative % 61  43 - 77 %   Neutro Abs 4.2  1.7 - 7.7 K/uL   Lymphocytes Relative 31  12 - 46 %   Lymphs Abs 2.1  0.7 - 4.0 K/uL   Monocytes Relative 7  3 - 12 %   Monocytes Absolute 0.5  0.1 - 1.0 K/uL   Eosinophils Relative 1  0 - 5 %   Eosinophils Absolute 0.1  0.0 - 0.7 K/uL   Basophils Relative 0  0 - 1 %   Basophils Absolute 0.0  0.0 - 0.1 K/uL  SEDIMENTATION RATE     Status: None   Collection Time    09/20/13 10:00 AM      Result Value Ref Range   Sed Rate 1  0 - 22 mm/hr  C-REACTIVE PROTEIN     Status: Abnormal   Collection Time    09/20/13 10:00 AM      Result Value Ref Range   CRP <0.5 (*) <0.60 mg/dL   Comment: Performed at [facility] Contrast  09/20/2013   CLINICAL DATA:  Loss of vision.  Left face paresthesias.  EXAM: CT HEAD WITHOUT CONTRAST  TECHNIQUE: Contiguous axial images were obtained from the base of the  skull through the vertex without intravenous contrast.  COMPARISON:  None.  FINDINGS: Skull and Sinuses:No significant abnormality.  Orbits: No acute abnormality.  Brain: There is an abnormal area of low-attenuation involving the posterior right frontal lobe cortex and subcortical white matter. There is no clear mass effect, although the abnormality is relatively small. No intracranial hemorrhage, hydrocephalus, or shift.  These results were called by telephone at the time of interpretation on 09/20/2013 at 1:46 AM to Dr. Teressa Lower , who verbally acknowledged these results.  IMPRESSION: Cortical and subcortical abnormality in the posterior right frontal lobe, subacute infarct versus mass. Brain MRI with contrast recommended.   Electronically Signed   By: Jorje Guild M.D.   On: 09/20/2013 01:46   Mr Jodene Nam Head Wo Contrast  09/21/2013   CLINICAL DATA:  Left facial numbness.  EXAM: MRI HEAD WITHOUT CONTRAST  MRA HEAD WITHOUT CONTRAST  TECHNIQUE: Multiplanar, multiecho pulse sequences of the brain and surrounding structures were obtained without intravenous contrast. Angiographic images of the head were obtained using MRA technique without contrast.  COMPARISON:  Head CT 09/20/2013  FINDINGS: MRI HEAD FINDINGS  There is no evidence of acute infarct. Corresponding to the region of hypodensity on CT is abnormal T2 hyperintensity involving cortex and subcortical white matter in the posterior right frontal lobe. There is mild gyral expansion. There is no abnormal enhancement. There is no evidence of intracranial hemorrhage.  Slightly prominent perivascular spaces are noted in the basal ganglia bilaterally. The remainder of the brain parenchyma is normal in appearance. Cerebral volume is normal for age. There is no midline shift or extra-axial fluid collection. Orbits are unremarkable. Paranasal sinuses and mastoid air cells are clear.  MRA HEAD FINDINGS  The visualized distal vertebral arteries are patent with the left  being minimally larger than the right. Left PICA origin is unremarkable. Right PICA origin is not identified. 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 SCA origins are patent. Basilar artery is patent without stenosis. PCAs are unremarkable. There are small bilateral posterior communicating arteries.  Internal carotid arteries are patent from skullbase to carotid termini. ACAS and MCAs are unremarkable. No intracranial aneurysm is identified.  IMPRESSION: 1. Nonenhancing, cortical/subcortical T2 hyperintensity in the posterior right frontal lobe. This is most concerning for neoplasm such as low-grade glioma. This does not appear to represent acute or early subacute infarction. 2. Unremarkable head MRA.   Electronically Signed   By: Logan Bores  On: 09/21/2013 09:55   Mr Jeri Cos HY Contrast  09/21/2013   CLINICAL DATA:  Left facial numbness.  EXAM: MRI HEAD WITHOUT CONTRAST  MRA HEAD WITHOUT CONTRAST  TECHNIQUE: Multiplanar, multiecho pulse sequences of the brain and surrounding structures were obtained without intravenous contrast. Angiographic images of the head were obtained using MRA technique without contrast.  COMPARISON:  Head CT 09/20/2013  FINDINGS: MRI HEAD FINDINGS  There is no evidence of acute infarct. Corresponding to the region of hypodensity on CT is abnormal T2 hyperintensity involving cortex and subcortical white matter in the posterior right frontal lobe. There is mild gyral expansion. There is no abnormal enhancement. There is no evidence of intracranial hemorrhage.  Slightly prominent perivascular spaces are noted in the basal ganglia bilaterally. The remainder of the brain parenchyma is normal in appearance. Cerebral volume is normal for age. There is no midline shift or extra-axial fluid collection. Orbits are unremarkable. Paranasal sinuses and mastoid air cells are clear.  MRA HEAD FINDINGS  The visualized distal vertebral arteries are patent with the left  being minimally larger than the right. Left PICA origin is unremarkable. Right PICA origin is not identified. 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 SCA origins are patent. Basilar artery is patent without stenosis. PCAs are unremarkable. There are small bilateral posterior communicating arteries.  Internal carotid arteries are patent from skullbase to carotid termini. ACAS and MCAs are unremarkable. No intracranial aneurysm is identified.  IMPRESSION: 1. Nonenhancing, cortical/subcortical T2 hyperintensity in the posterior right frontal lobe. This is most concerning for neoplasm such as low-grade glioma. This does not appear to represent acute or early subacute infarction. 2. Unremarkable head MRA.   Electronically Signed   By: Logan Bores   On: 09/21/2013 09:55    Review of Systems  Constitutional: Negative.   HENT: Negative.   Eyes:       Eyesight on left side appears as if she is looking through a fog  Respiratory: Negative.   Cardiovascular: Negative.   Gastrointestinal: Negative.   Genitourinary: Negative.   Musculoskeletal: Negative.   Skin: Negative.   Neurological: Positive for sensory change.       Left side of face feels different, and has felt so since this past Monday 3/2  Endo/Heme/Allergies: Negative.   Psychiatric/Behavioral: Negative.    Blood pressure 103/70, pulse 85, temperature 97.3 F (36.3 C), temperature source Oral, resp. rate 18, height _0  (1.727 m), weight 64.496 kg (142 lb 3 oz), last menstrual period 09/20/2012, SpO2 98.00%. Physical Exam  Constitutional: She is oriented to person, place, and time. She appears well-developed and well-nourished. No distress.  HENT:  Head: Normocephalic and atraumatic.  Right Ear: External ear normal.  Left Ear: External ear normal.  Nose: Nose normal.  Eyes: Conjunctivae and EOM are normal. Pupils are equal, round, and reactive to light.  Neck: Normal range of motion. Neck supple.   Cardiovascular: Normal rate, regular rhythm, normal heart sounds and intact distal pulses.   Respiratory: Effort normal.  GI: Soft.  Neurological: She is alert and oriented to person, place, and time. She has normal strength and normal reflexes. She displays normal reflexes. A cranial nerve deficit is present. No sensory deficit. She exhibits normal muscle tone. Coordination normal.  Diminished sensation left V3, V2. 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 It does not appear consistent with multiple sclerosis. Mrs. Foronda would like to  know what the abnormality is. I will therefore schedule her for a stereotactic biopsy this coming week. Risks and benefits bleeding, stroke, coma, death, non diagnostic biopsy, brain damage, need for further surgery were all discussed. She understands and will await my call for the operative date.   Jaquay Posthumus L 09/21/2013, 3:03 PM

## 2013-09-23 ENCOUNTER — Other Ambulatory Visit: Payer: Self-pay | Admitting: Neurosurgery

## 2013-09-24 ENCOUNTER — Ambulatory Visit (HOSPITAL_COMMUNITY)
Admission: RE | Admit: 2013-09-24 | Discharge: 2013-09-24 | Disposition: A | Discharge status: Home / Self Care | Payer: Medicaid Other | Source: Ambulatory Visit | Attending: Neurosurgery | Admitting: Neurosurgery

## 2013-09-24 ENCOUNTER — Other Ambulatory Visit (HOSPITAL_COMMUNITY): Payer: Self-pay | Admitting: Neurosurgery

## 2013-09-24 DIAGNOSIS — D496 Neoplasm of unspecified behavior of brain: Secondary | ICD-10-CM

## 2013-09-24 DIAGNOSIS — Z01818 Encounter for other preprocedural examination: Secondary | ICD-10-CM | POA: Insufficient documentation

## 2013-09-24 MED ORDER — GADOBENATE DIMEGLUMINE 529 MG/ML IV SOLN
15.0000 mL | Freq: Once | INTRAVENOUS | Status: AC
  Administered 2013-09-24: 13 mL via INTRAVENOUS

## 2013-09-25 ENCOUNTER — Encounter (HOSPITAL_COMMUNITY)
Admission: RE | Admit: 2013-09-25 | Discharge: 2013-09-25 | Disposition: A | Discharge status: Home / Self Care | Payer: Medicaid Other | Source: Ambulatory Visit | Attending: Neurosurgery | Admitting: Neurosurgery

## 2013-09-25 ENCOUNTER — Encounter (HOSPITAL_COMMUNITY): Payer: Self-pay

## 2013-09-25 HISTORY — DX: Neoplasm of unspecified behavior of brain: D49.6

## 2013-09-25 MED ORDER — CEFAZOLIN SODIUM-DEXTROSE 2-3 GM-% IV SOLR
2.0000 g | INTRAVENOUS | Status: AC
  Administered 2013-09-26: 2 g via INTRAVENOUS
  Filled 2013-09-25: qty 50

## 2013-09-25 NOTE — Pre-Procedure Instructions (Addendum)
Victoria Bates  09/25/2013   Your procedure is scheduled on: Thursday, September 26, 2013 at 2:40 PM  Report to [facility] (use Main Entrance "A'') at 10:00 AM.  Call this number if you have problems the morning of surgery: [phone]   Remember:   Do not eat food or drink liquids after midnight.   Take these medicines the morning of surgery with A SIP OF WATER :levETIRAcetam (KEPPRA) 500 MG tablet, if needed:ALPRAZolam [name] for anxiety,  acetaminophen (TYLENOL) for pain,  albuterol (PROVENTIL HFA;VENTOLIN HFA) 108 (90 BASE) MCG/ACT inhaler for wheezing ( Bring inhaler with you on day of procedure) Stop taking Aspirin and herbal medications. Do not take any NSAIDs ie: Ibuprofen, Advil, Naproxen or any medication containing Aspirin.  Do not wear jewelry, make-up or nail polish.  Do not wear lotions, powders, or perfumes. You may wear deodorant.  Do not shave 48 hours prior to surgery.  Do not bring valuables to the hospital.  [facility] is not responsible for any belongings or valuables.               Contacts, dentures or bridgework may not be worn into surgery.  Leave suitcase in the car. After surgery it may be brought to your room.  For patients admitted to the hospital, discharge time is determined by your treatment team.               Patients discharged the day of surgery will not be allowed to drive home.  Name and phone number of your driver:   Special Instructions:  Special Instructions:Special Instructions: [facility] - Preparing for Surgery  Before surgery, you can play an important role.  Because skin is not sterile, your skin needs to be as free of germs as possible.  You can reduce the number of germs on you skin by washing with CHG (chlorahexidine gluconate) soap before surgery.  CHG is an antiseptic cleaner which kills germs and bonds with the skin to continue killing germs even after washing.  Please DO NOT use if you have an allergy to CHG or antibacterial  soaps.  If your skin becomes reddened/irritated stop using the CHG and inform your nurse when you arrive at Short Stay.  Do not shave (including legs and underarms) for at least 48 hours prior to the first CHG shower.  You may shave your face.  Please follow these instructions carefully:   1.  Shower with CHG Soap the night before surgery and the morning of Surgery.  2.  If you choose to wash your hair, wash your hair first as usual with your normal shampoo.  3.  After you shampoo, rinse your hair and body thoroughly to remove the Shampoo.  4.  Use CHG as you would any other liquid soap.  You can apply chg directly  to the skin and wash gently with scrungie or a clean washcloth.  5.  Apply the CHG Soap to your body ONLY FROM THE NECK DOWN.  Do not use on open wounds or open sores.  Avoid contact with your eyes, ears, mouth and genitals (private parts).  Wash genitals (private parts) with your normal soap.  6.  Wash thoroughly, paying special attention to the area where your surgery will be performed.  7.  Thoroughly rinse your body with warm water from the neck down.  8.  DO NOT shower/wash with your normal soap after using and rinsing off the CHG Soap.  9.  Pat yourself  dry with a clean towel.            10.  Wear clean pajamas.            11.  Place clean sheets on your bed the night of your first shower and do not sleep with pets.  Day of Surgery  Do not apply any lotions the morning of surgery.  Please wear clean clothes to the hospital/surgery center.   Please read over the following fact sheets that you were given: Pain Booklet and Surgical Site Infection Prevention

## 2013-09-25 NOTE — Progress Notes (Signed)
Pt denies SOB, chest pain, and being under the care of a cardiologist. Pt stated that she may have had an echo at Dr. Marlou Porch of [facility]; results requested. Pt PCP is Dr. Lavone Orn. Pt denies having a history of Hypertension and anxiety. Pt stated that she took her last dose of Aspirin on Friday (09/20/13). Pt stated that her surgery was scheduled to start at 11:30 AM and she was told to arrive at 10:00 AM.  Spoke with Janett Billow at Dr. Lacy Duverney office to clarify pt arrival time; pt was instructed to arrive at 11:30 AM (per Janett Billow) for a 1440 start.

## 2013-09-26 ENCOUNTER — Inpatient Hospital Stay (HOSPITAL_COMMUNITY)
Admission: RE | Admit: 2013-09-26 | Discharge: 2013-09-28 | DRG: 027 | Disposition: A | Discharge status: Home / Self Care | Payer: Medicaid Other | Source: Ambulatory Visit | Attending: Neurosurgery | Admitting: Neurosurgery

## 2013-09-26 ENCOUNTER — Encounter (HOSPITAL_COMMUNITY): Payer: Medicaid Other | Admitting: Anesthesiology

## 2013-09-26 ENCOUNTER — Encounter (HOSPITAL_COMMUNITY)
Admission: RE | Disposition: A | Discharge status: Home / Self Care | Payer: Self-pay | Source: Ambulatory Visit | Attending: Neurosurgery

## 2013-09-26 ENCOUNTER — Encounter (HOSPITAL_COMMUNITY): Payer: Self-pay | Admitting: *Deleted

## 2013-09-26 ENCOUNTER — Inpatient Hospital Stay (HOSPITAL_COMMUNITY): Payer: Medicaid Other | Admitting: Anesthesiology

## 2013-09-26 DIAGNOSIS — F411 Generalized anxiety disorder: Secondary | ICD-10-CM | POA: Diagnosis present

## 2013-09-26 DIAGNOSIS — Z8249 Family history of ischemic heart disease and other diseases of the circulatory system: Secondary | ICD-10-CM

## 2013-09-26 DIAGNOSIS — J45909 Unspecified asthma, uncomplicated: Secondary | ICD-10-CM | POA: Diagnosis present

## 2013-09-26 DIAGNOSIS — Z888 Allergy status to other drugs, medicaments and biological substances status: Secondary | ICD-10-CM

## 2013-09-26 DIAGNOSIS — Z7982 Long term (current) use of aspirin: Secondary | ICD-10-CM

## 2013-09-26 DIAGNOSIS — Z91013 Allergy to seafood: Secondary | ICD-10-CM

## 2013-09-26 DIAGNOSIS — Z79899 Other long term (current) drug therapy: Secondary | ICD-10-CM

## 2013-09-26 DIAGNOSIS — I10 Essential (primary) hypertension: Secondary | ICD-10-CM | POA: Diagnosis present

## 2013-09-26 DIAGNOSIS — Z91040 Latex allergy status: Secondary | ICD-10-CM

## 2013-09-26 DIAGNOSIS — C711 Malignant neoplasm of frontal lobe: Principal | ICD-10-CM | POA: Diagnosis present

## 2013-09-26 HISTORY — PX: CRANIOTOMY: SHX93

## 2013-09-26 LAB — MRSA PCR SCREENING: MRSA by PCR: NEGATIVE

## 2013-09-26 SURGERY — CRANIOTOMY TUMOR EXCISION
Anesthesia: Monitor Anesthesia Care | Laterality: Right

## 2013-09-26 MED ORDER — ONDANSETRON HCL 4 MG/2ML IJ SOLN
INTRAMUSCULAR | Status: AC
  Filled 2013-09-26: qty 2

## 2013-09-26 MED ORDER — ALPRAZOLAM 0.5 MG PO TABS
0.5000 mg | ORAL_TABLET | Freq: Three times a day (TID) | ORAL | Status: DC | PRN
  Administered 2013-09-27 (×2): 1 mg via ORAL
  Filled 2013-09-26 (×2): qty 2

## 2013-09-26 MED ORDER — TRAMADOL HCL 50 MG PO TABS
50.0000 mg | ORAL_TABLET | Freq: Four times a day (QID) | ORAL | Status: DC | PRN
  Administered 2013-09-26 – 2013-09-27 (×4): 100 mg via ORAL
  Filled 2013-09-26 (×5): qty 2

## 2013-09-26 MED ORDER — LIDOCAINE HCL (PF) 1 % IJ SOLN
INTRAMUSCULAR | Status: DC | PRN
  Administered 2013-09-26: 28 mL

## 2013-09-26 MED ORDER — LEVETIRACETAM 500 MG PO TABS
500.0000 mg | ORAL_TABLET | Freq: Two times a day (BID) | ORAL | Status: DC
  Administered 2013-09-26 – 2013-09-28 (×4): 500 mg via ORAL
  Filled 2013-09-26 (×5): qty 1

## 2013-09-26 MED ORDER — NALOXONE HCL 0.4 MG/ML IJ SOLN: 0.0800 mg | INTRAMUSCULAR | Status: DC | PRN

## 2013-09-26 MED ORDER — DEXAMETHASONE SODIUM PHOSPHATE 4 MG/ML IJ SOLN
INTRAMUSCULAR | Status: AC
  Filled 2013-09-26: qty 2

## 2013-09-26 MED ORDER — SODIUM CHLORIDE 0.9 % IV SOLN
INTRAVENOUS | Status: DC | PRN
  Administered 2013-09-26: 15:00:00 via INTRAVENOUS

## 2013-09-26 MED ORDER — ALBUTEROL SULFATE (2.5 MG/3ML) 0.083% IN NEBU: 3.0000 mL | INHALATION_SOLUTION | Freq: Four times a day (QID) | RESPIRATORY_TRACT | Status: DC | PRN

## 2013-09-26 MED ORDER — ISOPROPYL ALCOHOL 70 % SOLN
Status: DC | PRN
  Administered 2013-09-26 (×2): 1 via TOPICAL

## 2013-09-26 MED ORDER — FENTANYL CITRATE 0.05 MG/ML IJ SOLN
INTRAMUSCULAR | Status: DC | PRN
  Administered 2013-09-26: 50 ug via INTRAVENOUS
  Administered 2013-09-26: 25 ug via INTRAVENOUS
  Administered 2013-09-26 (×2): 50 ug via INTRAVENOUS
  Administered 2013-09-26: 25 ug via INTRAVENOUS
  Administered 2013-09-26: 100 ug via INTRAVENOUS
  Administered 2013-09-26 (×2): 50 ug via INTRAVENOUS
  Administered 2013-09-26: 25 ug via INTRAVENOUS
  Administered 2013-09-26: 50 ug via INTRAVENOUS
  Administered 2013-09-26: 25 ug via INTRAVENOUS

## 2013-09-26 MED ORDER — SODIUM CHLORIDE 0.9 % IR SOLN
Status: DC | PRN
  Administered 2013-09-26 (×4): 1000 mL

## 2013-09-26 MED ORDER — ASPIRIN EC 81 MG PO TBEC
81.0000 mg | DELAYED_RELEASE_TABLET | Freq: Every day | ORAL | Status: DC
  Administered 2013-09-27 – 2013-09-28 (×2): 81 mg via ORAL
  Filled 2013-09-26 (×2): qty 1

## 2013-09-26 MED ORDER — HEMOSTATIC AGENTS (NO CHARGE) OPTIME
TOPICAL | Status: DC | PRN
  Administered 2013-09-26: 1 via TOPICAL

## 2013-09-26 MED ORDER — POTASSIUM CHLORIDE IN NACL 20-0.9 MEQ/L-% IV SOLN
INTRAVENOUS | Status: DC
  Administered 2013-09-26 – 2013-09-27 (×3): via INTRAVENOUS
  Filled 2013-09-26 (×5): qty 1000

## 2013-09-26 MED ORDER — PROMETHAZINE HCL 25 MG/ML IJ SOLN: 6.2500 mg | INTRAMUSCULAR | Status: DC | PRN

## 2013-09-26 MED ORDER — ONDANSETRON HCL 4 MG/2ML IJ SOLN: 4.0000 mg | INTRAMUSCULAR | Status: DC | PRN

## 2013-09-26 MED ORDER — FENTANYL CITRATE 0.05 MG/ML IJ SOLN: 25.0000 ug | INTRAMUSCULAR | Status: DC | PRN

## 2013-09-26 MED ORDER — FENTANYL CITRATE 0.05 MG/ML IJ SOLN
INTRAMUSCULAR | Status: AC
  Filled 2013-09-26: qty 5

## 2013-09-26 MED ORDER — ONDANSETRON HCL 4 MG PO TABS: 4.0000 mg | ORAL_TABLET | ORAL | Status: DC | PRN

## 2013-09-26 MED ORDER — SENNA 8.6 MG PO TABS
1.0000 | ORAL_TABLET | Freq: Two times a day (BID) | ORAL | Status: DC
  Administered 2013-09-27: 8.6 mg via ORAL
  Filled 2013-09-26 (×3): qty 1

## 2013-09-26 MED ORDER — LIDOCAINE-EPINEPHRINE 0.5 %-1:200000 IJ SOLN
INTRAMUSCULAR | Status: DC | PRN
  Administered 2013-09-26: 30 mL

## 2013-09-26 MED ORDER — FENTANYL CITRATE 0.05 MG/ML IJ SOLN: 50.0000 ug | Freq: Once | INTRAMUSCULAR | Status: DC

## 2013-09-26 MED ORDER — LABETALOL HCL 5 MG/ML IV SOLN: 10.0000 mg | INTRAVENOUS | Status: DC | PRN

## 2013-09-26 MED ORDER — LACTATED RINGERS IV SOLN
INTRAVENOUS | Status: DC
  Administered 2013-09-26: 13:00:00 via INTRAVENOUS

## 2013-09-26 MED ORDER — PROMETHAZINE HCL 25 MG PO TABS
12.5000 mg | ORAL_TABLET | ORAL | Status: DC | PRN
  Administered 2013-09-27: 25 mg via ORAL
  Filled 2013-09-26: qty 1

## 2013-09-26 MED ORDER — PROPOFOL 10 MG/ML IV BOLUS
INTRAVENOUS | Status: DC | PRN
  Administered 2013-09-26: 20 mg via INTRAVENOUS
  Administered 2013-09-26 (×2): 30 mg via INTRAVENOUS

## 2013-09-26 MED ORDER — MAGNESIUM CITRATE PO SOLN
1.0000 | Freq: Once | ORAL | Status: AC | PRN
  Filled 2013-09-26: qty 296

## 2013-09-26 MED ORDER — BACITRACIN ZINC 500 UNIT/GM EX OINT
TOPICAL_OINTMENT | CUTANEOUS | Status: DC | PRN
  Administered 2013-09-26 (×2): 1 via TOPICAL

## 2013-09-26 MED ORDER — DEXAMETHASONE 4 MG PO TABS
4.0000 mg | ORAL_TABLET | Freq: Three times a day (TID) | ORAL | Status: AC
  Administered 2013-09-26 – 2013-09-27 (×3): 4 mg via ORAL
  Filled 2013-09-26 (×3): qty 1

## 2013-09-26 MED ORDER — CARISOPRODOL 350 MG PO TABS
350.0000 mg | ORAL_TABLET | Freq: Three times a day (TID) | ORAL | Status: DC | PRN
  Administered 2013-09-26 – 2013-09-28 (×6): 350 mg via ORAL
  Filled 2013-09-26 (×6): qty 1

## 2013-09-26 MED ORDER — ASPIRIN 81 MG PO TABS: 81.0000 mg | ORAL_TABLET | Freq: Every day | ORAL | Status: DC

## 2013-09-26 MED ORDER — BISACODYL 10 MG RE SUPP: 10.0000 mg | Freq: Every day | RECTAL | Status: DC | PRN

## 2013-09-26 MED ORDER — THROMBIN 5000 UNITS EX SOLR
CUTANEOUS | Status: DC | PRN
  Administered 2013-09-26: 5000 [IU] via TOPICAL

## 2013-09-26 MED ORDER — MORPHINE SULFATE 2 MG/ML IJ SOLN
1.0000 mg | INTRAMUSCULAR | Status: DC | PRN
  Administered 2013-09-26: 1 mg via INTRAVENOUS
  Administered 2013-09-26 – 2013-09-27 (×2): 2 mg via INTRAVENOUS
  Filled 2013-09-26 (×4): qty 1

## 2013-09-26 MED ORDER — POLYETHYLENE GLYCOL 3350 17 G PO PACK
17.0000 g | PACK | Freq: Every day | ORAL | Status: DC | PRN
  Filled 2013-09-26: qty 1

## 2013-09-26 MED ORDER — PANTOPRAZOLE SODIUM 40 MG IV SOLR
40.0000 mg | Freq: Every day | INTRAVENOUS | Status: DC
  Administered 2013-09-26 – 2013-09-27 (×2): 40 mg via INTRAVENOUS
  Filled 2013-09-26 (×3): qty 40

## 2013-09-26 MED ORDER — MIDAZOLAM HCL 2 MG/2ML IJ SOLN: 1.0000 mg | INTRAMUSCULAR | Status: DC | PRN

## 2013-09-26 SURGICAL SUPPLY — 115 items
APL SKNCLS STERI-STRIP NONHPOA (GAUZE/BANDAGES/DRESSINGS)
BANDAGE GAUZE ELAST BULKY 4 IN (GAUZE/BANDAGES/DRESSINGS) IMPLANT
BENZOIN TINCTURE PRP APPL 2/3 (GAUZE/BANDAGES/DRESSINGS) IMPLANT
BLADE SAW GIGLI 16 STRL (MISCELLANEOUS) IMPLANT
BLADE SURG 11 STRL SS (BLADE) ×1 IMPLANT
BLADE SURG 15 STRL LF DISP TIS (BLADE) IMPLANT
BLADE SURG 15 STRL SS (BLADE)
BLADE SURG ROTATE 9660 (MISCELLANEOUS) ×1 IMPLANT
BLADE ULTRA TIP 2M (BLADE) ×1 IMPLANT
BRUSH SCRUB EZ 1% IODOPHOR (MISCELLANEOUS) ×1 IMPLANT
BUR ACORN 6.0 PRECISION (BURR) ×2 IMPLANT
BUR ADDG 1.1 (BURR) IMPLANT
BUR MATCHSTICK NEURO 3.0 LAGG (BURR) IMPLANT
BUR ROUTER D-58 CRANI (BURR) ×1 IMPLANT
CANISTER SUCT 3000ML (MISCELLANEOUS) ×2 IMPLANT
CATH VENTRIC 35X38 W/TROCAR LG (CATHETERS) IMPLANT
CLIP TI MEDIUM 6 (CLIP) IMPLANT
CONT SPEC 4OZ CLIKSEAL STRL BL (MISCELLANEOUS) ×5 IMPLANT
CORDS BIPOLAR (ELECTRODE) ×2 IMPLANT
COVER MAYO STAND STRL (DRAPES) ×1 IMPLANT
DECANTER SPIKE VIAL GLASS SM (MISCELLANEOUS) ×2 IMPLANT
DRAIN SNY WOU 7FLT (WOUND CARE) IMPLANT
DRAIN SUBARACHNOID (WOUND CARE) IMPLANT
DRAPE CAMERA VIDEO/LASER (DRAPES) IMPLANT
DRAPE LONG LASER MIC (DRAPES) IMPLANT
DRAPE MICROSCOPE LEICA (MISCELLANEOUS) ×1 IMPLANT
DRAPE NEUROLOGICAL W/INCISE (DRAPES) IMPLANT
DRAPE ORTHO SPLIT 77X108 STRL (DRAPES) ×2
DRAPE STERI IOBAN 125X83 (DRAPES) IMPLANT
DRAPE SURG 17X23 STRL (DRAPES) ×1 IMPLANT
DRAPE SURG IRRIG POUCH 19X23 (DRAPES) ×1 IMPLANT
DRAPE SURG ORHT 6 SPLT 77X108 (DRAPES) IMPLANT
DRAPE WARM FLUID 44X44 (DRAPE) ×2 IMPLANT
DRESSING TELFA 8X3 (GAUZE/BANDAGES/DRESSINGS) ×2 IMPLANT
DURAPREP 6ML APPLICATOR 50/CS (WOUND CARE) ×1 IMPLANT
ELECT CAUTERY BLADE 6.4 (BLADE) ×2 IMPLANT
ELECT REM PT RETURN 9FT ADLT (ELECTROSURGICAL) ×2
ELECTRODE REM PT RTRN 9FT ADLT (ELECTROSURGICAL) ×1 IMPLANT
EVACUATOR 1/8 PVC DRAIN (DRAIN) IMPLANT
EVACUATOR SILICONE 100CC (DRAIN) IMPLANT
GAUZE SPONGE 4X4 16PLY XRAY LF (GAUZE/BANDAGES/DRESSINGS) IMPLANT
GLOVE BIO SURGEON STRL SZ 6.5 (GLOVE) IMPLANT
GLOVE BIO SURGEON STRL SZ7 (GLOVE) IMPLANT
GLOVE BIO SURGEON STRL SZ7.5 (GLOVE) IMPLANT
GLOVE BIO SURGEON STRL SZ8 (GLOVE) IMPLANT
GLOVE BIO SURGEON STRL SZ8.5 (GLOVE) IMPLANT
GLOVE BIOGEL M 8.0 STRL (GLOVE) IMPLANT
GLOVE BIOGEL PI IND STRL 7.0 (GLOVE) IMPLANT
GLOVE BIOGEL PI INDICATOR 7.0 (GLOVE) ×1
GLOVE ECLIPSE 6.5 STRL STRAW (GLOVE) ×1 IMPLANT
GLOVE ECLIPSE 7.0 STRL STRAW (GLOVE) IMPLANT
GLOVE ECLIPSE 7.5 STRL STRAW (GLOVE) IMPLANT
GLOVE ECLIPSE 8.0 STRL XLNG CF (GLOVE) IMPLANT
GLOVE ECLIPSE 8.5 STRL (GLOVE) IMPLANT
GLOVE EXAM NITRILE LRG STRL (GLOVE) ×1 IMPLANT
GLOVE EXAM NITRILE MD LF STRL (GLOVE) IMPLANT
GLOVE EXAM NITRILE XL STR (GLOVE) IMPLANT
GLOVE EXAM NITRILE XS STR PU (GLOVE) IMPLANT
GLOVE INDICATOR 6.5 STRL GRN (GLOVE) IMPLANT
GLOVE INDICATOR 7.0 STRL GRN (GLOVE) IMPLANT
GLOVE INDICATOR 7.5 STRL GRN (GLOVE) IMPLANT
GLOVE INDICATOR 8.0 STRL GRN (GLOVE) IMPLANT
GLOVE INDICATOR 8.5 STRL (GLOVE) IMPLANT
GLOVE OPTIFIT SS 8.0 STRL (GLOVE) IMPLANT
GLOVE SURG SS PI 6.5 STRL IVOR (GLOVE) ×5 IMPLANT
GLOVE SURG SS PI 7.5 STRL IVOR (GLOVE) ×1 IMPLANT
GOWN BRE IMP SLV AUR LG STRL (GOWN DISPOSABLE) ×2 IMPLANT
GOWN BRE IMP SLV AUR XL STRL (GOWN DISPOSABLE) IMPLANT
GOWN STRL REIN 2XL LVL4 (GOWN DISPOSABLE) IMPLANT
GOWN STRL REUS W/ TWL LRG LVL3 (GOWN DISPOSABLE) IMPLANT
GOWN STRL REUS W/TWL LRG LVL3 (GOWN DISPOSABLE) ×6
HEMOSTAT SURGICEL 2X14 (HEMOSTASIS) IMPLANT
KIT BASIN OR (CUSTOM PROCEDURE TRAY) ×2 IMPLANT
KIT DRAIN CSF ACCUDRAIN (MISCELLANEOUS) IMPLANT
KIT ROOM TURNOVER OR (KITS) ×2 IMPLANT
NDL HYPO 18GX1.5 BLUNT FILL (NEEDLE) IMPLANT
NDL HYPO 25X1 1.5 SAFETY (NEEDLE) ×1 IMPLANT
NDL SPNL 18GX3.5 QUINCKE PK (NEEDLE) IMPLANT
NEEDLE HYPO 18GX1.5 BLUNT FILL (NEEDLE) ×2 IMPLANT
NEEDLE HYPO 25X1 1.5 SAFETY (NEEDLE) ×6 IMPLANT
NEEDLE SPNL 18GX3.5 QUINCKE PK (NEEDLE) IMPLANT
NS IRRIG 1000ML POUR BTL (IV SOLUTION) ×5 IMPLANT
PACK CRANIOTOMY (CUSTOM PROCEDURE TRAY) ×2 IMPLANT
PAD EYE OVAL STERILE LF (GAUZE/BANDAGES/DRESSINGS) IMPLANT
PATTIES SURGICAL .25X.25 (GAUZE/BANDAGES/DRESSINGS) IMPLANT
PATTIES SURGICAL .5 X.5 (GAUZE/BANDAGES/DRESSINGS) IMPLANT
PATTIES SURGICAL .5 X3 (DISPOSABLE) IMPLANT
PATTIES SURGICAL 1/4 X 3 (GAUZE/BANDAGES/DRESSINGS) IMPLANT
PATTIES SURGICAL 1X1 (DISPOSABLE) IMPLANT
PIN MAYFIELD SKULL DISP (PIN) ×1 IMPLANT
PLATE 1.5/0.5 18.5MM BURR HOLE (Plate) ×1 IMPLANT
RUBBERBAND STERILE (MISCELLANEOUS) ×1 IMPLANT
SCREW SELF DRILL HT 1.5/4MM (Screw) ×5 IMPLANT
SPECIMEN JAR SMALL (MISCELLANEOUS) IMPLANT
SPONGE GAUZE 4X4 12PLY (GAUZE/BANDAGES/DRESSINGS) ×2 IMPLANT
SPONGE NEURO XRAY DETECT 1X3 (DISPOSABLE) IMPLANT
SPONGE SURGIFOAM ABS GEL 100 (HEMOSTASIS) ×1 IMPLANT
STAPLER VISISTAT 35W (STAPLE) ×2 IMPLANT
SUT ETHILON 3 0 FSL (SUTURE) IMPLANT
SUT ETHILON 3 0 PS 1 (SUTURE) IMPLANT
SUT NURALON 4 0 TR CR/8 (SUTURE) ×4 IMPLANT
SUT PL GUT 3 0 FS 1 (SUTURE) IMPLANT
SUT SILK 0 TIES 10X30 (SUTURE) IMPLANT
SUT STEEL 0 (SUTURE)
SUT STEEL 0 18XMFL TIE 17 (SUTURE) IMPLANT
SUT VIC AB 2-0 CT2 18 VCP726D (SUTURE) ×3 IMPLANT
SYR 20ML ECCENTRIC (SYRINGE) ×2 IMPLANT
SYR CONTROL 10ML LL (SYRINGE) ×3 IMPLANT
TIP SONASTAR STD MISONIX 1.9 (TRAY / TRAY PROCEDURE) IMPLANT
TOWEL OR 17X24 6PK STRL BLUE (TOWEL DISPOSABLE) ×2 IMPLANT
TOWEL OR 17X26 10 PK STRL BLUE (TOWEL DISPOSABLE) ×2 IMPLANT
TRAY FOLEY CATH 14FRSI W/METER (CATHETERS) ×1 IMPLANT
TUBE CONNECTING 12X1/4 (SUCTIONS) ×4 IMPLANT
UNDERPAD 30X30 INCONTINENT (UNDERPADS AND DIAPERS) ×1 IMPLANT
WATER STERILE IRR 1000ML POUR (IV SOLUTION) ×2 IMPLANT

## 2013-09-26 NOTE — Op Note (Signed)
09/26/2013  7:01 PM  PATIENT:  Victoria Bates  33 y.o. 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 I prepped the injection sites to place local in the scalp prior to placing the [place] head holder. I placed the [place] without difficulty, then attached the [place] to the rest of the system. I then attached the [place] lab frame to the [place]. Using the preoperative scans a surgical plan was derived. Her head was prepped with a sterile shampoo with chlorhexidine and an alcohol rinse. I then draped her in a sterile manner.  I used the stereotactic system to plan the incision and opening. I opened the scalp with a 10 blade, used raney clips on the scalp edges and retracted the scalp with a cerebellar retractor. I confirmed my location with the Brain lab then turned a very small craniotomy over my target area. I took a fresh brain sample using the 11 blade to resect a whole piece of brain. I then took smaller pieces and sent them to pathology. I was told we had diagnostic tissue, and was asked if we could send more. I took more samples. Victoria Bates maintained a normal exam throughout the procedure. Dr. Hal Neer assisted with the biopsy. We then closed the wound placing a burrhole cover over the craniotomy site. I had good hemostasis prior to closing. I approximated the galea, then the scalp edges. I applied a sterile dressing.   PLAN OF  CARE: Admit to inpatient   PATIENT DISPOSITION:  PACU - hemodynamically stable.   Delay start of Pharmacological VTE agent (>24hrs) due to surgical blood loss or risk of bleeding:  yes

## 2013-09-26 NOTE — Anesthesia Preprocedure Evaluation (Signed)
Anesthesia Evaluation  Patient identified by MRN, date of birth, ID band Patient awake    Reviewed: Allergy & Precautions, H&P , NPO status , Patient's Chart, lab work & pertinent test results  History of Anesthesia Complications (+) PONV  Airway Mallampati: I TM Distance: >3 FB Neck ROM: Full    Dental   Pulmonary asthma ,  breath sounds clear to auscultation        Cardiovascular hypertension, Rhythm:Regular Rate:Normal     Neuro/Psych  Headaches, Anxiety Blurred vision L eye    GI/Hepatic   Endo/Other    Renal/GU      Musculoskeletal   Abdominal   Peds  Hematology   Anesthesia Other Findings   Reproductive/Obstetrics                           Anesthesia Physical Anesthesia Plan  ASA: II  Anesthesia Plan: MAC   Post-op Pain Management:    Induction: Intravenous  Airway Management Planned: Nasal Cannula  Additional Equipment:   Intra-op Plan:   Post-operative Plan:   Informed Consent: I have reviewed the patients History and Physical, chart, labs and discussed the procedure including the risks, benefits and alternatives for the proposed anesthesia with the patient or authorized representative who has indicated his/her understanding and acceptance.     Plan Discussed with: CRNA and Surgeon  Anesthesia Plan Comments:         Anesthesia Quick Evaluation

## 2013-09-26 NOTE — Transfer of Care (Signed)
Immediate Anesthesia Transfer of Care Note  Patient: Victoria Bates  Procedure(s) Performed: Procedure(s) with comments: Awake Right frontal open brain biopsy (Right) - awake Right frontal open brain biopsy  Patient Location: PACU  Anesthesia Type:MAC  Level of Consciousness: awake, alert , oriented and patient cooperative  Airway & Oxygen Therapy: Patient Spontanous Breathing  Post-op Assessment: Report given to PACU RN, Post -op Vital signs reviewed and stable and Patient moving all extremities X 4  Post vital signs: Reviewed and stable  Complications: No apparent anesthesia complications

## 2013-09-26 NOTE — Preoperative (Signed)
Beta Blockers   Reason not to administer Beta Blockers:Not Applicable 

## 2013-09-26 NOTE — Anesthesia Procedure Notes (Signed)
Procedure Name: MAC Date/Time: 09/26/2013 3:49 PM Performed by: Ned Grace Pre-anesthesia Checklist: Patient identified, Patient being monitored, Emergency Drugs available, Timeout performed and Suction available Patient Re-evaluated:Patient Re-evaluated prior to inductionOxygen Delivery Method: Nasal cannula Preoxygenation: Pre-oxygenation with 100% oxygen Intubation Type: IV induction

## 2013-09-26 NOTE — Anesthesia Postprocedure Evaluation (Signed)
  Anesthesia Post-op Note  Patient: Victoria Bates  Procedure(s) Performed: Procedure(s) with comments: Awake Right frontal open brain biopsy (Right) - awake Right frontal open brain biopsy  Patient Location: PACU  Anesthesia Type:MAC  Level of Consciousness: awake, alert  and oriented  Airway and Oxygen Therapy: Patient Spontanous Breathing  Post-op Pain: mild  Post-op Assessment: Post-op Vital signs reviewed  Post-op Vital Signs: Reviewed  Complications: No apparent anesthesia complications

## 2013-09-26 NOTE — Discharge Summary (Signed)
Physician Discharge Summary  Patient ID: Victoria Bates MRN: [id number] DOB/AGE: Jul 01, 1981 33 y.o.  Admit date: 09/19/2013 Discharge date: 09/26/2013  Admission Diagnoses: Left eye visual disturbance Numbness, left face  Discharge Diagnoses:  Principal Problem:   Left eye visual disturbance Active Problems:   Numbness left face Abnormal MRI   Discharged Condition: good  Hospital Course: The patient was admitted on 09/19/2013 complaining of some visual change in her left eye. She also complained of some left facial numbness and possibly numbness in the left upper arm. A CT scan of the brain without contrast showed a cortical and subcortical abnormality in the posterior right frontal lobe, subacute infarct versus mass. MRI of the brain showed a nonenhancing cortical subcortical T2 hyperintensity in the posterior right frontal lobe concerning for neoplasm such as low-grade glioma, no infarct. MRA of the brain was negative. The patient was seen by Dr. 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 The MRI was not consistent with multiple sclerosis. Stereotactic biopsy of the lesion was scheduled for the upcoming week. The patient was discharged in good condition  Consults: neurology and Neurosurgery  Significant Diagnostic Studies: labs: Hemoglobin 13.6, white cell 6.6, platelet count 262, TSH 2.0, sedimentation rate 2, and radiology: MRI: As above and CT scan: As above  Treatments: none  Discharge Exam: Blood pressure 103/70, pulse 85, temperature 97.3 F (36.3 C), temperature source Oral, resp. rate 18, height 5\' 8"  (1.727 m), weight 64.496 kg (142 lb 3 oz), last menstrual period 09/20/2012, SpO2 98.00%. General appearance: alert and cooperative  Disposition: 01-Home or Self Care     Medication List         acetaminophen 500 MG tablet  Commonly known as:  TYLENOL  Take 1,000 mg by mouth every 6 (six) hours as needed for pain.     albuterol 108 (90 BASE) MCG/ACT inhaler  Commonly known as:  PROVENTIL HFA;VENTOLIN HFA  Inhale 2 puffs into the lungs every 6 (six) hours as needed for wheezing (in summer time - occasional use only).     ALPRAZolam 1 MG tablet  Commonly known as:  XANAX  Take 0.5-1 mg by mouth 3 (three) times daily as needed for anxiety.     aspirin 81 MG tablet  Take 81 mg by mouth daily.     carisoprodol 350 MG tablet  Commonly known as:  SOMA  Take 350 mg by mouth 3 (three) times daily as needed for muscle spasms.     levETIRAcetam 500 MG tablet  Commonly known as:  KEPPRA  Take 1 tablet (500 mg total) by mouth every 12 (twelve) hours.           Follow-up Information   Follow up with CABBELL,KYLE L, MD. (will call you with surgical date. )    Specialty:  Neurosurgery   Contact information:   [address], [address]                         [address] [place] [address] [phone]       Signed: [name] 09/26/2013, 7:19 AM

## 2013-09-26 NOTE — H&P (View-Only) (Signed)
Reason for Consult:abnormal brain on MRI Referring Physician: [place], Spear is an 33 y.o. female.  HPI: whom earlier this week describes feeling numb on the left side of her face, feeling as if she is looking through a fog in the left eye. She was not having headaches. She was noted by a coworker to have a left facial droop on the day of admission and Victoria Bates describes it a Heaviness in her face. A ct showed a hypodensity in the right frontal lobe, MRI shows signal abnormalities in the right frontal lobe, fat gyri, indistinct sulci. I am consulted for recommendations about the abnormality.   Past Medical History  Diagnosis Date  . Liver hemangioma   . Hypertension   . Anemia   . Headache(784.0)     migraines  . Anxiety   . Asthma     very mild- no inhaler use since July 2013  . Complication of anesthesia     takes longer for patient to go under anesthesia  . PONV (postoperative nausea and vomiting)   . SVD (spontaneous vaginal delivery)     x 1    Past Surgical History  Procedure Laterality Date  . Cesarean section    . Coposcopy  08/06/2012    cervical biopsy  . Left knee surgery  2000    torn meniscus  . Right flank surgery  2005    bnign mass from flank area-right  . Esophagogastroduodenoscopy (egd) with propofol N/A 08/29/2012    Procedure: ESOPHAGOGASTRODUODENOSCOPY (EGD) WITH PROPOFOL;  Surgeon: Arta Silence, MD;  Location: [facility];  Service: Endoscopy;  Laterality: N/A;  . Colonoscopy with propofol N/A 08/29/2012    Procedure: COLONOSCOPY WITH PROPOFOL;  Surgeon: Arta Silence, MD;  Location: [facility];  Service: Endoscopy;  Laterality: N/A;  . Wisdom tooth extraction    . Robotic assisted total hysterectomy with bilateral salpingo oopherectomy Bilateral 09/25/2012    Procedure: ROBOTIC ASSISTED TOTAL HYSTERECTOMY WITH BILATERAL SALPINGO OOPHORECTOMY;  Surgeon: Maeola Sarah. Landry Mellow, MD;  Location: [facility];  Service: Gynecology;  Laterality: Bilateral;     Family History  Problem Relation Age of Onset  . Hypertension Mother     Social History:  reports that she has never smoked. She has never used smokeless tobacco. She reports that she does not drink alcohol or use illicit drugs.  Allergies:  Allergies  Allergen Reactions  . Adhesive [Tape]     Skin tear  . Latex Hives  . Shellfish Allergy Diarrhea and Nausea And Vomiting  . Soma [Carisoprodol]     Watson brand, specifically, causes disorientation    Medications: I have reviewed the patient's current medications.  Results for orders placed during the hospital encounter of 09/19/13 (from the past 48 hour(s))  CBC     Status: Abnormal   Collection Time    09/19/13 11:54 PM      Result Value Ref Range   WBC 6.6  4.0 - 10.5 K/uL   RBC 4.23  3.87 - 5.11 MIL/uL   Hemoglobin 13.6  12.0 - 15.0 g/dL   HCT 38.7  36.0 - 46.0 %   MCV 91.5  78.0 - 100.0 fL   MCH 32.2  26.0 - 34.0 pg   MCHC 35.1  30.0 - 36.0 g/dL   RDW 11.4 (*) 11.5 - 15.5 %   Platelets 262  150 - 400 K/uL  I-STAT CHEM 8, ED     Status: Abnormal   Collection Time    09/20/13  12:05 AM      Result Value Ref Range   Sodium 141  137 - 147 mEq/L   Potassium 3.3 (*) 3.7 - 5.3 mEq/L   Chloride 100  96 - 112 mEq/L   BUN 5 (*) 6 - 23 mg/dL   Creatinine, Ser 0.70  0.50 - 1.10 mg/dL   Glucose, Bld 87  70 - 99 mg/dL   Calcium, Ion 1.20  1.12 - 1.23 mmol/L   TCO2 26  0 - 100 mmol/L   Hemoglobin 14.6  12.0 - 15.0 g/dL   HCT 43.0  36.0 - 46.0 %  C-REACTIVE PROTEIN     Status: Abnormal   Collection Time    09/20/13  4:17 AM      Result Value Ref Range   CRP <0.5 (*) <0.60 mg/dL   Comment: Performed at [facility]     Status: None   Collection Time    09/20/13  4:17 AM      Result Value Ref Range   Sed Rate 2  0 - 22 mm/hr  TSH     Status: None   Collection Time    09/20/13  4:17 AM      Result Value Ref Range   TSH 2.016  0.350 - 4.500 uIU/mL   Comment: Performed at [facility]     Status: None   Collection Time    09/20/13  4:17 AM      Result Value Ref Range   Hemoglobin A1C 5.1  <5.7 %   Comment: (NOTE)                                                                               According to the ADA Clinical Practice Recommendations for 2011, when     HbA1c is used as a screening test:      >=6.5%   Diagnostic of Diabetes Mellitus               (if abnormal result is confirmed)     5.7-6.4%   Increased risk of developing Diabetes Mellitus     References:Diagnosis and Classification of Diabetes Mellitus,Diabetes     WLSL,3734,28(JGOTL 1):S62-S69 and Standards of Medical Care in             Diabetes - 2011,Diabetes Care,2011,34 (Suppl 1):S11-S61.   Mean Plasma Glucose 100  <117 mg/dL   Comment: Performed at [facility]     Status: None   Collection Time    09/20/13  4:50 AM      Result Value Ref Range   Cholesterol 166  0 - 200 mg/dL   Triglycerides 57  <150 mg/dL   HDL 77  >39 mg/dL   Total CHOL/HDL Ratio 2.2     VLDL 11  0 - 40 mg/dL   LDL Cholesterol 78  0 - 99 mg/dL   Comment:            Total Cholesterol/HDL:CHD Risk     Coronary Heart Disease Risk Table  Men   Women      1/2 Average Risk   3.4   3.3      Average Risk       5.0   4.4      2 X Average Risk   9.6   7.1      3 X Average Risk  23.4   11.0                Use the calculated Patient Ratio     above and the CHD Risk Table     to determine the patient's CHD Risk.                ATP III CLASSIFICATION (LDL):      <100     mg/dL   Optimal      100-129  mg/dL   Near or Above                        Optimal      130-159  mg/dL   Borderline      160-189  mg/dL   High      >190     mg/dL   Very High  COMPREHENSIVE METABOLIC PANEL     Status: Abnormal   Collection Time    09/20/13  4:55 AM      Result Value Ref Range   Sodium 138  137 - 147 mEq/L   Potassium 3.6 (*) 3.7 - 5.3 mEq/L   Chloride 100  96 - 112  mEq/L   CO2 23  19 - 32 mEq/L   Glucose, Bld 87  70 - 99 mg/dL   BUN 6  6 - 23 mg/dL   Creatinine, Ser 0.58  0.50 - 1.10 mg/dL   Calcium 9.5  8.4 - 10.5 mg/dL   Total Protein 6.9  6.0 - 8.3 g/dL   Albumin 3.9  3.5 - 5.2 g/dL   AST 12  0 - 37 U/L   ALT 8  0 - 35 U/L   Alkaline Phosphatase 40  39 - 117 U/L   Total Bilirubin 0.7  0.3 - 1.2 mg/dL   GFR calc non Af Amer >90  >90 mL/min   GFR calc Af Amer >90  >90 mL/min   Comment: (NOTE)     The eGFR has been calculated using the CKD EPI equation.     This calculation has not been validated in all clinical situations.     eGFR's persistently <90 mL/min signify possible Chronic Kidney     Disease.  CBC WITH DIFFERENTIAL     Status: None   Collection Time    09/20/13  4:55 AM      Result Value Ref Range   WBC 6.9  4.0 - 10.5 K/uL   RBC 4.03  3.87 - 5.11 MIL/uL   Hemoglobin 12.7  12.0 - 15.0 g/dL   HCT 36.6  36.0 - 46.0 %   MCV 90.8  78.0 - 100.0 fL   MCH 31.5  26.0 - 34.0 pg   MCHC 34.7  30.0 - 36.0 g/dL   RDW 11.5  11.5 - 15.5 %   Platelets 234  150 - 400 K/uL   Neutrophils Relative % 61  43 - 77 %   Neutro Abs 4.2  1.7 - 7.7 K/uL   Lymphocytes Relative 31  12 - 46 %   Lymphs Abs 2.1  0.7 - 4.0 K/uL   Monocytes Relative 7  3 - 12 %   Monocytes Absolute 0.5  0.1 - 1.0 K/uL   Eosinophils Relative 1  0 - 5 %   Eosinophils Absolute 0.1  0.0 - 0.7 K/uL   Basophils Relative 0  0 - 1 %   Basophils Absolute 0.0  0.0 - 0.1 K/uL  SEDIMENTATION RATE     Status: None   Collection Time    09/20/13 10:00 AM      Result Value Ref Range   Sed Rate 1  0 - 22 mm/hr  C-REACTIVE PROTEIN     Status: Abnormal   Collection Time    09/20/13 10:00 AM      Result Value Ref Range   CRP <0.5 (*) <0.60 mg/dL   Comment: Performed at [facility] Contrast  09/20/2013   CLINICAL DATA:  Loss of vision.  Left face paresthesias.  EXAM: CT HEAD WITHOUT CONTRAST  TECHNIQUE: Contiguous axial images were obtained from the base of the  skull through the vertex without intravenous contrast.  COMPARISON:  None.  FINDINGS: Skull and Sinuses:No significant abnormality.  Orbits: No acute abnormality.  Brain: There is an abnormal area of low-attenuation involving the posterior right frontal lobe cortex and subcortical white matter. There is no clear mass effect, although the abnormality is relatively small. No intracranial hemorrhage, hydrocephalus, or shift.  These results were called by telephone at the time of interpretation on 09/20/2013 at 1:46 AM to Dr. Teressa Lower , who verbally acknowledged these results.  IMPRESSION: Cortical and subcortical abnormality in the posterior right frontal lobe, subacute infarct versus mass. Brain MRI with contrast recommended.   Electronically Signed   By: Jorje Guild M.D.   On: 09/20/2013 01:46   Mr Jodene Nam Head Wo Contrast  09/21/2013   CLINICAL DATA:  Left facial numbness.  EXAM: MRI HEAD WITHOUT CONTRAST  MRA HEAD WITHOUT CONTRAST  TECHNIQUE: Multiplanar, multiecho pulse sequences of the brain and surrounding structures were obtained without intravenous contrast. Angiographic images of the head were obtained using MRA technique without contrast.  COMPARISON:  Head CT 09/20/2013  FINDINGS: MRI HEAD FINDINGS  There is no evidence of acute infarct. Corresponding to the region of hypodensity on CT is abnormal T2 hyperintensity involving cortex and subcortical white matter in the posterior right frontal lobe. There is mild gyral expansion. There is no abnormal enhancement. There is no evidence of intracranial hemorrhage.  Slightly prominent perivascular spaces are noted in the basal ganglia bilaterally. The remainder of the brain parenchyma is normal in appearance. Cerebral volume is normal for age. There is no midline shift or extra-axial fluid collection. Orbits are unremarkable. Paranasal sinuses and mastoid air cells are clear.  MRA HEAD FINDINGS  The visualized distal vertebral arteries are patent with the left  being minimally larger than the right. Left PICA origin is unremarkable. Right PICA origin is not identified. 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 SCA origins are patent. Basilar artery is patent without stenosis. PCAs are unremarkable. There are small bilateral posterior communicating arteries.  Internal carotid arteries are patent from skullbase to carotid termini. ACAS and MCAs are unremarkable. No intracranial aneurysm is identified.  IMPRESSION: 1. Nonenhancing, cortical/subcortical T2 hyperintensity in the posterior right frontal lobe. This is most concerning for neoplasm such as low-grade glioma. This does not appear to represent acute or early subacute infarction. 2. Unremarkable head MRA.   Electronically Signed   By: Logan Bores  On: 09/21/2013 09:55   Mr Jeri Cos HY Contrast  09/21/2013   CLINICAL DATA:  Left facial numbness.  EXAM: MRI HEAD WITHOUT CONTRAST  MRA HEAD WITHOUT CONTRAST  TECHNIQUE: Multiplanar, multiecho pulse sequences of the brain and surrounding structures were obtained without intravenous contrast. Angiographic images of the head were obtained using MRA technique without contrast.  COMPARISON:  Head CT 09/20/2013  FINDINGS: MRI HEAD FINDINGS  There is no evidence of acute infarct. Corresponding to the region of hypodensity on CT is abnormal T2 hyperintensity involving cortex and subcortical white matter in the posterior right frontal lobe. There is mild gyral expansion. There is no abnormal enhancement. There is no evidence of intracranial hemorrhage.  Slightly prominent perivascular spaces are noted in the basal ganglia bilaterally. The remainder of the brain parenchyma is normal in appearance. Cerebral volume is normal for age. There is no midline shift or extra-axial fluid collection. Orbits are unremarkable. Paranasal sinuses and mastoid air cells are clear.  MRA HEAD FINDINGS  The visualized distal vertebral arteries are patent with the left  being minimally larger than the right. Left PICA origin is unremarkable. Right PICA origin is not identified. 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 SCA origins are patent. Basilar artery is patent without stenosis. PCAs are unremarkable. There are small bilateral posterior communicating arteries.  Internal carotid arteries are patent from skullbase to carotid termini. ACAS and MCAs are unremarkable. No intracranial aneurysm is identified.  IMPRESSION: 1. Nonenhancing, cortical/subcortical T2 hyperintensity in the posterior right frontal lobe. This is most concerning for neoplasm such as low-grade glioma. This does not appear to represent acute or early subacute infarction. 2. Unremarkable head MRA.   Electronically Signed   By: Logan Bores   On: 09/21/2013 09:55    Review of Systems  Constitutional: Negative.   HENT: Negative.   Eyes:       Eyesight on left side appears as if she is looking through a fog  Respiratory: Negative.   Cardiovascular: Negative.   Gastrointestinal: Negative.   Genitourinary: Negative.   Musculoskeletal: Negative.   Skin: Negative.   Neurological: Positive for sensory change.       Left side of face feels different, and has felt so since this past Monday 3/2  Endo/Heme/Allergies: Negative.   Psychiatric/Behavioral: Negative.    Blood pressure 103/70, pulse 85, temperature 97.3 F (36.3 C), temperature source Oral, resp. rate 18, height _0  (1.727 m), weight 64.496 kg (142 lb 3 oz), last menstrual period 09/20/2012, SpO2 98.00%. Physical Exam  Constitutional: She is oriented to person, place, and time. She appears well-developed and well-nourished. No distress.  HENT:  Head: Normocephalic and atraumatic.  Right Ear: External ear normal.  Left Ear: External ear normal.  Nose: Nose normal.  Eyes: Conjunctivae and EOM are normal. Pupils are equal, round, and reactive to light.  Neck: Normal range of motion. Neck supple.   Cardiovascular: Normal rate, regular rhythm, normal heart sounds and intact distal pulses.   Respiratory: Effort normal.  GI: Soft.  Neurological: She is alert and oriented to person, place, and time. She has normal strength and normal reflexes. She displays normal reflexes. A cranial nerve deficit is present. No sensory deficit. She exhibits normal muscle tone. Coordination normal.  Diminished sensation left V3, V2. 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 It does not appear consistent with multiple sclerosis. Mrs. Foronda would like to  know what the abnormality is. I will therefore schedule her for a stereotactic biopsy this coming week. Risks and benefits bleeding, stroke, coma, death, non diagnostic biopsy, brain damage, need for further surgery were all discussed. She understands and will await my call for the operative date.   Vance Belcourt L 09/21/2013, 3:03 PM

## 2013-09-26 NOTE — Interval H&P Note (Signed)
History and Physical Interval Note:  09/26/2013 3:07 PM  Victoria Bates  has presented today for surgery, with the diagnosis of brain tumor  The various methods of treatment have been discussed with the patient and family. After consideration of risks, benefits and other options for treatment, the patient has consented to  Procedure(s) with comments: Awake Right frontal open brain biopsy (Right) - awake Right frontal open brain biopsy as a surgical intervention .  The patient's history has been reviewed, patient examined, no change in status, stable for surgery.  I have reviewed the patient's chart and labs.  Questions were answered to the patient's satisfaction.     [name]

## 2013-09-27 MED ORDER — ACETAMINOPHEN 325 MG PO TABS
325.0000 mg | ORAL_TABLET | Freq: Four times a day (QID) | ORAL | Status: DC | PRN
  Administered 2013-09-27 – 2013-09-28 (×3): 650 mg via ORAL
  Filled 2013-09-27 (×3): qty 2

## 2013-09-27 MED ORDER — DEXAMETHASONE 4 MG PO TABS
4.0000 mg | ORAL_TABLET | Freq: Three times a day (TID) | ORAL | Status: DC
  Administered 2013-09-27 – 2013-09-28 (×2): 4 mg via ORAL
  Filled 2013-09-27 (×5): qty 1

## 2013-09-27 MED ORDER — KETOROLAC TROMETHAMINE 15 MG/ML IJ SOLN
15.0000 mg | Freq: Four times a day (QID) | INTRAMUSCULAR | Status: DC
  Administered 2013-09-27 – 2013-09-28 (×4): 15 mg via INTRAVENOUS
  Filled 2013-09-27 (×8): qty 1

## 2013-09-27 NOTE — Anesthesia Postprocedure Evaluation (Signed)
Anesthesia Post Note  Patient: Victoria Bates  Procedure(s) Performed: Procedure(s) (LRB): Awake Right frontal open brain biopsy (Right)  Anesthesia type: MAC  Patient location: PACU  Post pain: Pain level controlled  Post assessment: Patient's Cardiovascular Status Stable  Post vital signs: Reviewed and stable  Level of consciousness: sedated  Complications: No apparent anesthesia complications

## 2013-09-27 NOTE — Discharge Instructions (Signed)
Craniotomy °Care After °Please read the instructions outlined below and refer to this sheet in the next few weeks. These discharge instructions provide you with general information on caring for yourself after you leave the hospital. Your surgeon may also give you specific instructions. While your treatment has been planned according to the most current medical practices available, unavoidable complications occasionally occur. If you have any problems or questions after discharge, please call your surgeon. °Although there are many types of brain surgery, recovery following craniotomy (surgical opening of the skull) is much the same for each. However, recovery depends on many factors. These include the type and severity of brain injury and the type of surgery. It also depends on any nervous system function problems (neurological deficits) before surgery. If the craniotomy was done for cancer, chemotherapy and radiation could follow. You could be in the hospital from 5 days to a couple weeks. This depends on the type of surgery, findings, and whether there are complications. °HOME CARE INSTRUCTIONS  °· It is not unusual to hear a clicking noise after a craniotomy, the plates and screws used to attach the bone flap can sometimes cause this. It is a normal occurrence if this does happen °· Do not drive for 10 days after the operation °· Your scalp may feel spongy for a while, because of fluid under it. This will gradually get better. Occasionally, the surgeon will not replace the bone that was removed to access the brain. If there is a bony defect, the surgeon will ask you to wear a helmet for protection. This is a discussion you should have with your surgeon prior to leaving the hospital (discharge). °· Numbness may persist in some areas of your scalp. °· Take all medications as directed. Sometimes steroids to control swelling are prescribed. Anticonvulsants to prevent seizures may also be given. Do not use alcohol,  other drugs, or medications unless your surgeon says it is OK. °· Keep the wound dry and clean. The wound may be washed gently with soap and water. Then, you may gently blot or dab it dry, without rubbing. Do not take baths, use swimming pools or hot tubs for 10 days, or as instructed by your caregiver. It is best to wait to see you surgeon at your first postoperative visit, and to get directions at that time. °· Only take over-the-counter or prescription medicines for pain, discomfort, or fever as directed by your caregiver. °· You may continue your normal diet, as directed. °· Walking is OK for exercise. Wait at least 3 months before you return to mild, non-contact sports or as your surgeon suggests. Contact sports should be avoided for at least 1 year, unless your surgeon says it is OK. °· If you are prescribed steroids, take them exactly as prescribed. If you start having a decrease in nervous system functions (neurological deficits) and headaches as the dose of steroids is reduced, tell your surgeon right away. °· When the anticonvulsant prescription is finished you no longer need to take it. °SEEK IMMEDIATE MEDICAL CARE IF:  °· You develop nausea, vomiting, severe headaches, confusion, or you have a seizure. °· You develop chest pain, a stiff neck, or difficulty breathing. °· There is redness, swelling, or increasing pain in the wound or pin insertion sites. °· You have an increase in swelling or bruising around the eyes. °· There is drainage or pus coming from the wound. °· You have an oral temperature above 102° F (38.9° C), not controlled by medicine. °·   You notice a foul smell coming from the wound or dressing. °· The wound breaks open (edges not staying together) after the stitches have been removed. °· You develop dizziness or fainting while standing. °· You develop a rash. °· You develop any reaction or side effects to the medications given. °Document Released: 10/04/2005 Document Revised: 09/26/2011  Document Reviewed: 07/13/2009 °Exit[employer]® Patient Information ©2013 [employer]. ° °

## 2013-09-27 NOTE — Progress Notes (Signed)
UR completed.  Hubert Raatz, RN BSN [facility] CCM Trauma/Neuro ICU Case Manager [phone]  

## 2013-09-27 NOTE — Discharge Summary (Signed)
Physician Discharge Summary  Patient ID: Victoria Bates MRN: [id number] DOB/AGE: [birth year] 33 y.o.  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 No final path available at the time of discharge.She has a normal exam at discharge. She is voiding, ambulating, and tolerating a regular diet. Her wound is clean, dry, and without signs of infection.   Consults: None  Significant Diagnostic Studies: none  Treatments: surgery: Stereotactic right frontal awake craniotomy for biopsy cerebral mass  Discharge Exam: Blood pressure 104/62, pulse 85, temperature 97.9 F (36.6 C), temperature source Oral, resp. rate 16, height 5\' 7"  (1.702 m), weight 66.6 kg (146 lb 13.2 oz), last menstrual period 09/20/2012, SpO2 100.00%. General appearance: alert, cooperative, appears stated age and no distress Neurologic: Alert and oriented X 3, normal strength and tone. Normal symmetric reflexes. Normal coordination and gait  Disposition: 01-Home or Self Care     Medication List         acetaminophen 500 MG tablet  Commonly known as:  TYLENOL  Take 1,000 mg by mouth every 6 (six) hours as needed for pain.     albuterol 108 (90 BASE) MCG/ACT inhaler  Commonly known as:  PROVENTIL HFA;VENTOLIN HFA  Inhale 2 puffs into the lungs every 6 (six) hours as needed for wheezing (in summer time - occasional use only).     ALPRAZolam 1 MG tablet  Commonly known as:  XANAX  Take 0.5-1 mg by mouth 3 (three) times daily as needed for anxiety.     aspirin 325 MG tablet  Take 325 mg by mouth daily.     aspirin 81 MG tablet  Take 81 mg by mouth daily.     carisoprodol 350 MG tablet  Commonly  known as:  SOMA  Take 350 mg by mouth 3 (three) times daily as needed for muscle spasms.     levETIRAcetam 500 MG tablet  Commonly known as:  KEPPRA  Take 1 tablet (500 mg total) by mouth every 12 (twelve) hours.     traMADol 50 MG tablet  Commonly known as:  ULTRAM  Take 50 mg by mouth every 6 (six) hours as needed (1-2 tabs).           Follow-up Information   Follow up with Zayveon Raschke L, MD. (I will call you when pathology is back, but no sooner than Tuesday)    Specialty:  Neurosurgery   Contact information:   [address], [address]                         [address] [place] [address] [phone]       Signed: Radiah Lubinski L 09/27/2013, 9:31 PM

## 2013-09-28 NOTE — Progress Notes (Signed)
Pt. [place] home via car with family.  DC instructions were given to patient.  After her shower incision was bleeding were scab was removed.  Pressure and gauze applied to stop bleeding.  Pt. Vital signs and assessments were stable.  Additionally, incision stopped bleeding.

## 2013-09-28 NOTE — Progress Notes (Signed)
Pt wants to be discharge tomorrow morning. Will continue to monitor pt.

## 2013-10-01 ENCOUNTER — Encounter (HOSPITAL_COMMUNITY): Payer: Self-pay | Admitting: Neurosurgery

## 2013-10-23 ENCOUNTER — Encounter (HOSPITAL_COMMUNITY): Payer: Self-pay

## 2013-10-28 ENCOUNTER — Ambulatory Visit
Admission: RE | Admit: 2013-10-28 | Discharge: 2013-10-28 | Disposition: A | Discharge status: Home / Self Care | Payer: Medicaid Other | Source: Ambulatory Visit | Attending: Neurosurgery | Admitting: Neurosurgery

## 2013-10-28 ENCOUNTER — Other Ambulatory Visit: Payer: Self-pay | Admitting: Neurosurgery

## 2013-10-28 DIAGNOSIS — C711 Malignant neoplasm of frontal lobe: Secondary | ICD-10-CM

## 2013-10-28 MED ORDER — IOHEXOL 300 MG/ML  SOLN
75.0000 mL | Freq: Once | INTRAMUSCULAR | Status: AC | PRN
  Administered 2013-10-28: 75 mL via INTRAVENOUS

## 2013-10-29 ENCOUNTER — Other Ambulatory Visit: Payer: Self-pay | Admitting: Neurosurgery

## 2013-10-30 ENCOUNTER — Inpatient Hospital Stay (HOSPITAL_COMMUNITY): Payer: Medicaid Other

## 2013-10-30 ENCOUNTER — Encounter (HOSPITAL_COMMUNITY)
Admission: RE | Disposition: A | Discharge status: Home / Self Care | Payer: Self-pay | Source: Ambulatory Visit | Attending: Neurosurgery

## 2013-10-30 ENCOUNTER — Inpatient Hospital Stay (HOSPITAL_COMMUNITY): Payer: Medicaid Other | Admitting: Certified Registered Nurse Anesthetist

## 2013-10-30 ENCOUNTER — Ambulatory Visit (HOSPITAL_COMMUNITY)
Admission: RE | Admit: 2013-10-30 | Discharge: 2013-10-30 | Disposition: A | Discharge status: Home / Self Care | Payer: Medicaid Other | Source: Ambulatory Visit | Attending: Neurosurgery | Admitting: Neurosurgery

## 2013-10-30 ENCOUNTER — Encounter (HOSPITAL_COMMUNITY): Payer: Medicaid Other | Admitting: Certified Registered Nurse Anesthetist

## 2013-10-30 ENCOUNTER — Encounter (HOSPITAL_COMMUNITY): Payer: Self-pay | Admitting: Pharmacy Technician

## 2013-10-30 ENCOUNTER — Encounter (HOSPITAL_COMMUNITY): Payer: Self-pay | Admitting: *Deleted

## 2013-10-30 DIAGNOSIS — J45909 Unspecified asthma, uncomplicated: Secondary | ICD-10-CM | POA: Insufficient documentation

## 2013-10-30 DIAGNOSIS — Z9109 Other allergy status, other than to drugs and biological substances: Secondary | ICD-10-CM | POA: Insufficient documentation

## 2013-10-30 DIAGNOSIS — Z86011 Personal history of benign neoplasm of the brain: Secondary | ICD-10-CM | POA: Insufficient documentation

## 2013-10-30 DIAGNOSIS — Y838 Other surgical procedures as the cause of abnormal reaction of the patient, or of later complication, without mention of misadventure at the time of the procedure: Secondary | ICD-10-CM | POA: Insufficient documentation

## 2013-10-30 DIAGNOSIS — R51 Headache: Secondary | ICD-10-CM | POA: Insufficient documentation

## 2013-10-30 DIAGNOSIS — G40909 Epilepsy, unspecified, not intractable, without status epilepticus: Secondary | ICD-10-CM | POA: Insufficient documentation

## 2013-10-30 DIAGNOSIS — Z885 Allergy status to narcotic agent status: Secondary | ICD-10-CM | POA: Insufficient documentation

## 2013-10-30 DIAGNOSIS — Z888 Allergy status to other drugs, medicaments and biological substances status: Secondary | ICD-10-CM | POA: Insufficient documentation

## 2013-10-30 DIAGNOSIS — Z79899 Other long term (current) drug therapy: Secondary | ICD-10-CM | POA: Insufficient documentation

## 2013-10-30 DIAGNOSIS — Z91013 Allergy to seafood: Secondary | ICD-10-CM | POA: Insufficient documentation

## 2013-10-30 DIAGNOSIS — F411 Generalized anxiety disorder: Secondary | ICD-10-CM | POA: Insufficient documentation

## 2013-10-30 DIAGNOSIS — T8140XA Infection following a procedure, unspecified, initial encounter: Secondary | ICD-10-CM | POA: Insufficient documentation

## 2013-10-30 DIAGNOSIS — I10 Essential (primary) hypertension: Secondary | ICD-10-CM | POA: Insufficient documentation

## 2013-10-30 DIAGNOSIS — D649 Anemia, unspecified: Secondary | ICD-10-CM | POA: Insufficient documentation

## 2013-10-30 HISTORY — DX: Unspecified infectious disease: B99.9

## 2013-10-30 HISTORY — PX: CRANIOTOMY: SHX93

## 2013-10-30 LAB — CBC
HCT: 37.5 % (ref 36.0–46.0)
Hemoglobin: 12.9 g/dL (ref 12.0–15.0)
MCH: 31.5 pg (ref 26.0–34.0)
MCHC: 34.4 g/dL (ref 30.0–36.0)
MCV: 91.5 fL (ref 78.0–100.0)
Platelets: 247 10*3/uL (ref 150–400)
RBC: 4.1 MIL/uL (ref 3.87–5.11)
RDW: 12 % (ref 11.5–15.5)
WBC: 3.9 10*3/uL — ABNORMAL LOW (ref 4.0–10.5)

## 2013-10-30 LAB — BASIC METABOLIC PANEL
BUN: 10 mg/dL (ref 6–23)
CO2: 25 mEq/L (ref 19–32)
Calcium: 9.2 mg/dL (ref 8.4–10.5)
Chloride: 103 mEq/L (ref 96–112)
Creatinine, Ser: 0.72 mg/dL (ref 0.50–1.10)
GFR calc Af Amer: >90 mL/min (ref >90)
GFR calc non Af Amer: >90 mL/min (ref >90)
Glucose, Bld: 90 mg/dL (ref 70–99)
Potassium: 4.2 mEq/L (ref 3.7–5.3)
Sodium: 141 mEq/L (ref 137–147)

## 2013-10-30 LAB — SURGICAL PCR SCREEN
MRSA, PCR: NEGATIVE
Staphylococcus aureus: POSITIVE — AB

## 2013-10-30 SURGERY — CRANIOTOMY BONE FLAP/PROSTHETIC PLATE
Anesthesia: Monitor Anesthesia Care

## 2013-10-30 SURGERY — CRANIOTOMY BONE FLAP/PROSTHETIC PLATE
Anesthesia: Monitor Anesthesia Care | Site: Head

## 2013-10-30 MED ORDER — TRAMADOL HCL 50 MG PO TABS: 50.0000 mg | ORAL_TABLET | Freq: Four times a day (QID) | ORAL | Status: DC | PRN

## 2013-10-30 MED ORDER — PROMETHAZINE HCL 25 MG/ML IJ SOLN: 6.2500 mg | INTRAMUSCULAR | Status: DC | PRN

## 2013-10-30 MED ORDER — 0.9 % SODIUM CHLORIDE (POUR BTL) OPTIME
TOPICAL | Status: DC | PRN
  Administered 2013-10-30: 1000 mL

## 2013-10-30 MED ORDER — VANCOMYCIN HCL IN DEXTROSE 1-5 GM/200ML-% IV SOLN
INTRAVENOUS | Status: AC
  Filled 2013-10-30: qty 200

## 2013-10-30 MED ORDER — BACITRACIN ZINC 500 UNIT/GM EX OINT
TOPICAL_OINTMENT | CUTANEOUS | Status: DC | PRN
  Administered 2013-10-30: 1 via TOPICAL

## 2013-10-30 MED ORDER — LACTATED RINGERS IV SOLN
INTRAVENOUS | Status: DC | PRN
  Administered 2013-10-30: 08:00:00 via INTRAVENOUS

## 2013-10-30 MED ORDER — LIDOCAINE-EPINEPHRINE 1 %-1:100000 IJ SOLN
INTRAMUSCULAR | Status: AC
  Filled 2013-10-30: qty 1

## 2013-10-30 MED ORDER — MUPIROCIN 2 % EX OINT: TOPICAL_OINTMENT | Freq: Two times a day (BID) | CUTANEOUS | Status: DC

## 2013-10-30 MED ORDER — DIPHENHYDRAMINE HCL 50 MG/ML IJ SOLN
25.0000 mg | Freq: Once | INTRAMUSCULAR | Status: AC
Start: 2013-10-30 — End: 2013-10-30
  Administered 2013-10-30: 25 mg via INTRAVENOUS

## 2013-10-30 MED ORDER — DIPHENHYDRAMINE HCL 25 MG PO CAPS: 25.0000 mg | ORAL_CAPSULE | Freq: Once | ORAL | Status: DC

## 2013-10-30 MED ORDER — SODIUM BICARBONATE 8.4 % IV SOLN
INTRAVENOUS | Status: DC | PRN
  Administered 2013-10-30: 3 mL

## 2013-10-30 MED ORDER — CIPROFLOXACIN HCL 500 MG PO TABS: 500.0000 mg | ORAL_TABLET | Freq: Two times a day (BID) | ORAL | Status: DC

## 2013-10-30 MED ORDER — VANCOMYCIN HCL IN DEXTROSE 1-5 GM/200ML-% IV SOLN
1000.0000 mg | Freq: Once | INTRAVENOUS | Status: AC
  Administered 2013-10-30: 1000 mg via INTRAVENOUS

## 2013-10-30 MED ORDER — MUPIROCIN 2 % EX OINT
TOPICAL_OINTMENT | Freq: Two times a day (BID) | CUTANEOUS | Status: DC
  Administered 2013-10-30: 08:00:00 via NASAL
  Filled 2013-10-30 (×2): qty 22

## 2013-10-30 MED ORDER — BUPIVACAINE-EPINEPHRINE 0.25% -1:200000 IJ SOLN
INTRAMUSCULAR | Status: DC | PRN
  Administered 2013-10-30: 20 mL

## 2013-10-30 MED ORDER — HYDROMORPHONE HCL PF 1 MG/ML IJ SOLN: 0.2500 mg | INTRAMUSCULAR | Status: DC | PRN

## 2013-10-30 MED ORDER — DIPHENHYDRAMINE HCL 50 MG/ML IJ SOLN
INTRAMUSCULAR | Status: DC
Start: 2013-10-30 — End: 2013-10-30
  Filled 2013-10-30: qty 1

## 2013-10-30 MED ORDER — FENTANYL CITRATE 0.05 MG/ML IJ SOLN
INTRAMUSCULAR | Status: DC | PRN
  Administered 2013-10-30 (×3): 25 ug via INTRAVENOUS

## 2013-10-30 SURGICAL SUPPLY — 54 items
BANDAGE GAUZE 4  KLING STR (GAUZE/BANDAGES/DRESSINGS) IMPLANT
BANDAGE GAUZE ELAST BULKY 4 IN (GAUZE/BANDAGES/DRESSINGS) ×6 IMPLANT
BIT DRILL WIRE PASS 1.3MM (BIT) IMPLANT
BUR ACORN 6.0 PRECISION (BURR) IMPLANT
BUR ROUTER D-58 CRANI (BURR) IMPLANT
CANISTER SUCT 3000ML (MISCELLANEOUS) ×3 IMPLANT
CONT SPEC 4OZ CLIKSEAL STRL BL (MISCELLANEOUS) ×3 IMPLANT
CORDS BIPOLAR (ELECTRODE) ×3 IMPLANT
DRAIN PENROSE 1/2X12 LTX STRL (WOUND CARE) IMPLANT
DRAIN SNY WOU 7FLT (WOUND CARE) IMPLANT
DRAPE NEUROLOGICAL W/INCISE (DRAPES) ×3 IMPLANT
DRAPE SURG 17X23 STRL (DRAPES) IMPLANT
DRAPE WARM FLUID 44X44 (DRAPE) ×3 IMPLANT
DRILL WIRE PASS 1.3MM (BIT)
DURAPREP 6ML APPLICATOR 50/CS (WOUND CARE) ×3 IMPLANT
ELECT CAUTERY BLADE 6.4 (BLADE) ×3 IMPLANT
ELECT REM PT RETURN 9FT ADLT (ELECTROSURGICAL) ×2
ELECTRODE REM PT RTRN 9FT ADLT (ELECTROSURGICAL) ×2 IMPLANT
EVACUATOR SILICONE 100CC (DRAIN) IMPLANT
GAUZE SPONGE 4X4 16PLY XRAY LF (GAUZE/BANDAGES/DRESSINGS) IMPLANT
GLOVE ECLIPSE 6.5 STRL STRAW (GLOVE) ×3 IMPLANT
GLOVE EXAM NITRILE LRG STRL (GLOVE) IMPLANT
GLOVE EXAM NITRILE MD LF STRL (GLOVE) IMPLANT
GLOVE EXAM NITRILE XL STR (GLOVE) IMPLANT
GLOVE EXAM NITRILE XS STR PU (GLOVE) IMPLANT
GOWN BRE IMP SLV AUR LG STRL (GOWN DISPOSABLE) IMPLANT
GOWN BRE IMP SLV AUR XL STRL (GOWN DISPOSABLE) IMPLANT
GOWN STRL REIN 2XL LVL4 (GOWN DISPOSABLE) IMPLANT
HOOK DURA (MISCELLANEOUS) IMPLANT
KIT BASIN OR (CUSTOM PROCEDURE TRAY) ×3 IMPLANT
KIT ROOM TURNOVER OR (KITS) ×3 IMPLANT
NEEDLE HYPO 22GX1.5 SAFETY (NEEDLE) ×3 IMPLANT
NS IRRIG 1000ML POUR BTL (IV SOLUTION) ×3 IMPLANT
PACK CRANIOTOMY (CUSTOM PROCEDURE TRAY) ×3 IMPLANT
PAD ABD 8X10 STRL (GAUZE/BANDAGES/DRESSINGS) IMPLANT
PAD ARMBOARD 7.5X6 YLW CONV (MISCELLANEOUS) ×3 IMPLANT
PATTIES SURGICAL .5 X.5 (GAUZE/BANDAGES/DRESSINGS) IMPLANT
PATTIES SURGICAL .5 X3 (DISPOSABLE) IMPLANT
PATTIES SURGICAL 1X1 (DISPOSABLE) IMPLANT
PIN MAYFIELD SKULL DISP (PIN) IMPLANT
SPONGE GAUZE 4X4 12PLY (GAUZE/BANDAGES/DRESSINGS) IMPLANT
SPONGE NEURO XRAY DETECT 1X3 (DISPOSABLE) IMPLANT
SPONGE SURGIFOAM ABS GEL 100 (HEMOSTASIS) ×3 IMPLANT
STAPLER SKIN PROX WIDE 3.9 (STAPLE) ×3 IMPLANT
SUT ETHILON 3 0 FSL (SUTURE) IMPLANT
SUT NURALON 4 0 TR CR/8 (SUTURE) ×6 IMPLANT
SUT VIC AB 2-0 CT2 18 VCP726D (SUTURE) ×6 IMPLANT
SYR 20ML ECCENTRIC (SYRINGE) ×3 IMPLANT
SYR CONTROL 10ML LL (SYRINGE) ×3 IMPLANT
TOWEL OR 17X24 6PK STRL BLUE (TOWEL DISPOSABLE) ×3 IMPLANT
TOWEL OR 17X26 10 PK STRL BLUE (TOWEL DISPOSABLE) ×6 IMPLANT
TRAY FOLEY CATH 16FRSI W/METER (SET/KITS/TRAYS/PACK) IMPLANT
UNDERPAD 30X30 INCONTINENT (UNDERPADS AND DIAPERS) IMPLANT
WATER STERILE IRR 1000ML POUR (IV SOLUTION) ×3 IMPLANT

## 2013-10-30 SURGICAL SUPPLY — 60 items
BANDAGE GAUZE 4  KLING STR (GAUZE/BANDAGES/DRESSINGS) IMPLANT
BANDAGE GAUZE ELAST BULKY 4 IN (GAUZE/BANDAGES/DRESSINGS) ×4 IMPLANT
BIT DRILL WIRE PASS 1.3MM (BIT) IMPLANT
BLADE SURG 15 STRL LF DISP TIS (BLADE) IMPLANT
BLADE SURG 15 STRL SS (BLADE) ×2
BUR ACORN 6.0 PRECISION (BURR) IMPLANT
BUR ROUTER D-58 CRANI (BURR) IMPLANT
CANISTER SUCT 3000ML (MISCELLANEOUS) ×2 IMPLANT
CONT SPEC 4OZ CLIKSEAL STRL BL (MISCELLANEOUS) ×2 IMPLANT
CORDS BIPOLAR (ELECTRODE) ×2 IMPLANT
DRAIN PENROSE 1/2X12 LTX STRL (WOUND CARE) IMPLANT
DRAIN SNY WOU 7FLT (WOUND CARE) IMPLANT
DRAPE INCISE IOBAN 66X45 STRL (DRAPES) ×1 IMPLANT
DRAPE NEUROLOGICAL W/INCISE (DRAPES) ×2 IMPLANT
DRAPE STERI 35X30 U-POUCH (DRAPES) ×1 IMPLANT
DRAPE SURG 17X23 STRL (DRAPES) ×2 IMPLANT
DRAPE WARM FLUID 44X44 (DRAPE) ×2 IMPLANT
DRESSING TELFA 8X10 (GAUZE/BANDAGES/DRESSINGS) ×1 IMPLANT
DRILL WIRE PASS 1.3MM (BIT)
DURAPREP 6ML APPLICATOR 50/CS (WOUND CARE) ×2 IMPLANT
ELECT CAUTERY BLADE 6.4 (BLADE) ×2 IMPLANT
ELECT REM PT RETURN 9FT ADLT (ELECTROSURGICAL) ×2
ELECTRODE REM PT RTRN 9FT ADLT (ELECTROSURGICAL) ×1 IMPLANT
EVACUATOR SILICONE 100CC (DRAIN) IMPLANT
GAUZE SPONGE 4X4 16PLY XRAY LF (GAUZE/BANDAGES/DRESSINGS) IMPLANT
GLOVE ECLIPSE 6.5 STRL STRAW (GLOVE) ×1 IMPLANT
GLOVE EXAM NITRILE LRG STRL (GLOVE) IMPLANT
GLOVE EXAM NITRILE MD LF STRL (GLOVE) IMPLANT
GLOVE EXAM NITRILE XL STR (GLOVE) IMPLANT
GLOVE EXAM NITRILE XS STR PU (GLOVE) IMPLANT
GLOVE SURG SS PI 6.5 STRL IVOR (GLOVE) ×2 IMPLANT
GOWN BRE IMP SLV AUR LG STRL (GOWN DISPOSABLE) IMPLANT
GOWN BRE IMP SLV AUR XL STRL (GOWN DISPOSABLE) IMPLANT
GOWN STRL REIN 2XL LVL4 (GOWN DISPOSABLE) IMPLANT
HOOK DURA (MISCELLANEOUS) IMPLANT
KIT BASIN OR (CUSTOM PROCEDURE TRAY) ×2 IMPLANT
KIT ROOM TURNOVER OR (KITS) ×2 IMPLANT
NEEDLE HYPO 22GX1.5 SAFETY (NEEDLE) ×2 IMPLANT
NS IRRIG 1000ML POUR BTL (IV SOLUTION) ×2 IMPLANT
PACK CRANIOTOMY (CUSTOM PROCEDURE TRAY) ×2 IMPLANT
PAD ABD 8X10 STRL (GAUZE/BANDAGES/DRESSINGS) IMPLANT
PAD ARMBOARD 7.5X6 YLW CONV (MISCELLANEOUS) ×2 IMPLANT
PATTIES SURGICAL .5 X.5 (GAUZE/BANDAGES/DRESSINGS) IMPLANT
PATTIES SURGICAL .5 X3 (DISPOSABLE) IMPLANT
PATTIES SURGICAL 1X1 (DISPOSABLE) IMPLANT
PIN MAYFIELD SKULL DISP (PIN) IMPLANT
SPONGE GAUZE 4X4 12PLY (GAUZE/BANDAGES/DRESSINGS) IMPLANT
SPONGE NEURO XRAY DETECT 1X3 (DISPOSABLE) IMPLANT
SPONGE SURGIFOAM ABS GEL 100 (HEMOSTASIS) ×2 IMPLANT
STAPLER SKIN PROX WIDE 3.9 (STAPLE) ×2 IMPLANT
SUT ETHILON 3 0 FSL (SUTURE) IMPLANT
SUT NURALON 4 0 TR CR/8 (SUTURE) ×4 IMPLANT
SUT VIC AB 2-0 CT2 18 VCP726D (SUTURE) ×4 IMPLANT
SYR 20ML ECCENTRIC (SYRINGE) ×2 IMPLANT
SYR CONTROL 10ML LL (SYRINGE) ×2 IMPLANT
TOWEL OR 17X24 6PK STRL BLUE (TOWEL DISPOSABLE) ×2 IMPLANT
TOWEL OR 17X26 10 PK STRL BLUE (TOWEL DISPOSABLE) ×4 IMPLANT
TRAY FOLEY CATH 16FRSI W/METER (SET/KITS/TRAYS/PACK) IMPLANT
UNDERPAD 30X30 INCONTINENT (UNDERPADS AND DIAPERS) IMPLANT
WATER STERILE IRR 1000ML POUR (IV SOLUTION) ×2 IMPLANT

## 2013-10-30 NOTE — Anesthesia Postprocedure Evaluation (Signed)
Anesthesia Post Note  Patient: Victoria Bates  Procedure(s) Performed: Procedure(s) (LRB): debridement of cranial wound  right temporal incision,  (N/A)  Anesthesia type: MAC  Patient location: PACU  Post pain: Pain level controlled  Post assessment: Patient's Cardiovascular Status Stable  Last Vitals:  Filed Vitals:   10/30/13 1104  BP: 117/77  Pulse: 81  Temp:   Resp: 18    Post vital signs: Reviewed and stable  Level of consciousness: sedated  Complications: No apparent anesthesia complications

## 2013-10-30 NOTE — Transfer of Care (Signed)
Immediate Anesthesia Transfer of Care Note  Patient: Victoria Bates  Procedure(s) Performed: Procedure(s): debridement of cranial wound  right temporal incision,  (N/A)  Patient Location: PACU  Anesthesia Type:MAC  Level of Consciousness: awake, alert  and oriented  Airway & Oxygen Therapy: Patient Spontanous Breathing  Post-op Assessment: Report given to PACU RN  Post vital signs: Reviewed  Complications: No apparent anesthesia complications

## 2013-10-30 NOTE — H&P (Signed)
BP 132/64  Pulse 84  Temp(Src) 97.7 F (36.5 C) (Oral)  Resp 20  Ht 5\' 8"  (1.727 m)  Wt 64.411 kg (142 lb)  BMI 21.60 kg/m2  SpO2 100%  LMP 09/20/2012 Victoria Bates is a 33 y.o. female Whom was taken to the operating room for an open biopsy of the right frontal lobe. Post op she has developed an infection at the incision site. She will be taken to the operating room for an open debridement of the wound.  Allergies  Allergen Reactions  . Adhesive [Tape]     Skin tear  . Latex Hives  . Percocet [Oxycodone-Acetaminophen] Other (See Comments)    makes pt angry  . Shellfish Allergy Diarrhea and Nausea And Vomiting  . Soma [Carisoprodol]     Watson brand, specifically, causes disorientation   Past Medical History  Diagnosis Date  . Liver hemangioma   . Hypertension   . Anemia   . Headache(784.0)     migraines  . Anxiety   . Asthma     very mild- no inhaler use since July 2013  . SVD (spontaneous vaginal delivery)     x 1  . Complication of anesthesia     takes longer for patient to go under anesthesia  . PONV (postoperative nausea and vomiting)   . Brain tumor     Hx: of  . Infection     to right head incision   Past Surgical History  Procedure Laterality Date  . Cesarean section    . Coposcopy  08/06/2012    cervical biopsy  . Left knee surgery  2000    torn meniscus  . Right flank surgery  2005    bnign mass from flank area-right  . Esophagogastroduodenoscopy (egd) with propofol N/A 08/29/2012    Procedure: ESOPHAGOGASTRODUODENOSCOPY (EGD) WITH PROPOFOL;  Surgeon: Arta Silence, MD;  Location: [facility];  Service: Endoscopy;  Laterality: N/A;  . Colonoscopy with propofol N/A 08/29/2012    Procedure: COLONOSCOPY WITH PROPOFOL;  Surgeon: Arta Silence, MD;  Location: [facility];  Service: Endoscopy;  Laterality: N/A;  . Wisdom tooth extraction    . Robotic assisted total hysterectomy with bilateral salpingo oopherectomy Bilateral 09/25/2012    Procedure:  ROBOTIC ASSISTED TOTAL HYSTERECTOMY WITH BILATERAL SALPINGO OOPHORECTOMY;  Surgeon: Maeola Sarah. Landry Mellow, MD;  Location: [facility];  Service: Gynecology;  Laterality: Bilateral;  . Abdominal hysterectomy    . Craniotomy Right 09/26/2013    Procedure: Awake Right frontal open brain biopsy;  Surgeon: Winfield Cunas, MD;  Location: [facility];  Service: Neurosurgery;  Laterality: Right;  awake Right frontal open brain biopsy   Prior to Admission medications   Medication Sig Start Date End Date Taking? Authorizing Provider  acetaminophen (TYLENOL) 500 MG tablet Take 1,000 mg by mouth 2 (two) times daily as needed.    Yes Historical Provider, MD  albuterol (PROVENTIL HFA;VENTOLIN HFA) 108 (90 BASE) MCG/ACT inhaler Inhale 2 puffs into the lungs every 6 (six) hours as needed for wheezing (in summer time - occasional use only).   Yes Historical Provider, MD  ALPRAZolam [name] 1 MG tablet Take 1 mg by mouth 3 (three) times daily as needed for anxiety.    Yes Historical Provider, MD  aspirin 325 MG tablet Take 325 mg by mouth daily.   Yes Historical Provider, MD  carisoprodol (SOMA) 350 MG tablet Take 350 mg by mouth 4 (four) times daily.    Yes Historical Provider, MD  cephALEXin (KEFLEX) 500 MG  capsule Take 500 mg by mouth 4 (four) times daily.   Yes Historical Provider, MD  levETIRAcetam (KEPPRA) 500 MG tablet Take 500 mg by mouth 3 (three) times daily. 09/21/13  Yes Winfield Cunas, MD  traMADol (ULTRAM) 50 MG tablet Take 50-100 mg by mouth every 6 (six) hours as needed for severe pain (for pain).    Yes Historical Provider, MD   History  Substance Use Topics  . Smoking status: Never Smoker   . Smokeless tobacco: Never Used  . Alcohol Use: No   Family History  Problem Relation Age of Onset  . Hypertension Mother    Physical Exam  Constitutional: She is oriented to person, place, and time. She appears well-developed and well-nourished. No distress.  HENT:  Head: Normocephalic and atraumatic.  Right Ear:  External ear normal.  Left Ear: External ear normal.  Nose: Nose normal.  Mouth/Throat: Oropharynx is clear and moist.  Eyes: Conjunctivae and EOM are normal. Pupils are equal, round, and reactive to light.  Neck: Normal range of motion. Neck supple.  Cardiovascular: Normal rate, regular rhythm and normal heart sounds.   Pulmonary/Chest: Effort normal and breath sounds normal.  Abdominal: Soft. Bowel sounds are normal.  Musculoskeletal: Normal range of motion.  Neurological: She is alert and oriented to person, place, and time. She has normal strength and normal reflexes. She displays normal reflexes. No cranial nerve deficit or sensory deficit. She exhibits normal muscle tone. Coordination normal. She displays no Babinski's sign on the right side. She displays no Babinski's sign on the left side.  Scalp wound with purulent drainage  Skin: She is not diaphoretic.  or for debridement

## 2013-10-30 NOTE — Progress Notes (Signed)
Called Dr. Tobias Alexander regarding the patients rash from Vancomycin.  Rash is noted to be almost completely gone, just slight redness noted.  Patient denies any purititis.  Dr. Tobias Alexander agreed that the patient could be discharged to home.

## 2013-10-30 NOTE — Op Note (Signed)
10/30/2013  10:14 AM  PATIENT:  Victoria Bates  33 y.o. female with purulent drainage from scalp incision. She is taken to the or for irrigation and debridement of the wound.   PRE-OPERATIVE DIAGNOSIS:  infection of cranial wound  right temporal incision  POST-OPERATIVE DIAGNOSIS:  infection of cranial wound  right temporal incision  PROCEDURE:  Procedure(s): debridement of cranial wound  right temporal incision,   SURGEON:  Surgeon(s): Winfield Cunas, MD  ASSISTANTS:none  ANESTHESIA:   local and IV sedation  EBL:  Total I/O In: 500 [I.V.:500] Out: -   BLOOD ADMINISTERED:none  CELL SAVER GIVEN:none  COUNT:per nursing  DRAINS: none   SPECIMEN:  Source of Specimen:  wound cultures  DICTATION: [name] was taken to the operating room and positioned on a horseshoe headrest. She was awake during the case. Her head was prepared with duraprep, and I infiltrated marcaine with epinephrine around the wound. She was draped in a sterile manner. I opened the wound with scissors and dissected underneath the scalp in all directions. There was no purulent drainage identified. I did take cultures anaerobic, and aerobic both. I then irrigated ~1 liter of saline into the wound. The temporalis muscle appeared quite healthy, no purulence noted around the plate. I then looked for any remaining vicryl suture to remove it. I closed the wound in a single layer with 3-0 nylon sutures in an interrupted manner. I applied a sterile dressing to the wound.   PLAN OF CARE: Discharge to home after PACU  PATIENT DISPOSITION:  PACU - hemodynamically stable.   Delay start of Pharmacological VTE agent (>24hrs) due to surgical blood loss or risk of bleeding:  yes

## 2013-10-30 NOTE — Anesthesia Preprocedure Evaluation (Addendum)
Anesthesia Evaluation  Patient identified by MRN, date of birth, ID band Patient awake    Reviewed: Allergy & Precautions, H&P , NPO status , Patient's Chart, lab work & pertinent test results  History of Anesthesia Complications (+) PONV and history of anesthetic complications  Airway Mallampati: II TM Distance: >3 FB     Dental  (+) Teeth Intact, Dental Advisory Given   Pulmonary asthma ,    Pulmonary exam normal       Cardiovascular hypertension,     Neuro/Psych  Headaches, Seizures -, Well Controlled,  Anxiety    GI/Hepatic negative GI ROS, Neg liver ROS,   Endo/Other  negative endocrine ROS  Renal/GU negative Renal ROS     Musculoskeletal   Abdominal   Peds  Hematology  (+) anemia ,   Anesthesia Other Findings   Reproductive/Obstetrics                        Anesthesia Physical Anesthesia Plan  ASA: II  Anesthesia Plan: General and MAC   Post-op Pain Management:    Induction: Intravenous  Airway Management Planned: Simple Face Mask  Additional Equipment:   Intra-op Plan:   Post-operative Plan: Extubation in OR  Informed Consent: I have reviewed the patients History and Physical, chart, labs and discussed the procedure including the risks, benefits and alternatives for the proposed anesthesia with the patient or authorized representative who has indicated his/her understanding and acceptance.   Dental advisory given  Plan Discussed with: CRNA, Anesthesiologist and Surgeon  Anesthesia Plan Comments:       Anesthesia Quick Evaluation

## 2013-10-30 NOTE — Discharge Instructions (Signed)
Call if your temperature is greater than 101.5, if the wound drains, if the wound is very red around its borders.

## 2013-10-31 ENCOUNTER — Emergency Department (HOSPITAL_COMMUNITY): Payer: Medicaid Other

## 2013-10-31 ENCOUNTER — Other Ambulatory Visit: Payer: Self-pay

## 2013-10-31 ENCOUNTER — Encounter (HOSPITAL_COMMUNITY): Payer: Self-pay | Admitting: Neurosurgery

## 2013-10-31 ENCOUNTER — Emergency Department (HOSPITAL_COMMUNITY)
Admission: EM | Admit: 2013-10-31 | Discharge: 2013-10-31 | Disposition: A | Discharge status: Home / Self Care | Payer: Medicaid Other | Attending: Emergency Medicine | Admitting: Emergency Medicine

## 2013-10-31 DIAGNOSIS — I10 Essential (primary) hypertension: Secondary | ICD-10-CM | POA: Insufficient documentation

## 2013-10-31 DIAGNOSIS — R000 Tachycardia, unspecified: Secondary | ICD-10-CM | POA: Insufficient documentation

## 2013-10-31 DIAGNOSIS — M2569 Stiffness of other specified joint, not elsewhere classified: Secondary | ICD-10-CM | POA: Insufficient documentation

## 2013-10-31 DIAGNOSIS — R1013 Epigastric pain: Secondary | ICD-10-CM | POA: Insufficient documentation

## 2013-10-31 DIAGNOSIS — J45901 Unspecified asthma with (acute) exacerbation: Secondary | ICD-10-CM | POA: Insufficient documentation

## 2013-10-31 DIAGNOSIS — Z792 Long term (current) use of antibiotics: Secondary | ICD-10-CM | POA: Insufficient documentation

## 2013-10-31 DIAGNOSIS — Z8619 Personal history of other infectious and parasitic diseases: Secondary | ICD-10-CM | POA: Insufficient documentation

## 2013-10-31 DIAGNOSIS — F411 Generalized anxiety disorder: Secondary | ICD-10-CM | POA: Insufficient documentation

## 2013-10-31 DIAGNOSIS — M2560 Stiffness of unspecified joint, not elsewhere classified: Secondary | ICD-10-CM

## 2013-10-31 DIAGNOSIS — Z91040 Latex allergy status: Secondary | ICD-10-CM | POA: Insufficient documentation

## 2013-10-31 DIAGNOSIS — Z862 Personal history of diseases of the blood and blood-forming organs and certain disorders involving the immune mechanism: Secondary | ICD-10-CM | POA: Insufficient documentation

## 2013-10-31 DIAGNOSIS — Z79899 Other long term (current) drug therapy: Secondary | ICD-10-CM | POA: Insufficient documentation

## 2013-10-31 DIAGNOSIS — R0781 Pleurodynia: Secondary | ICD-10-CM

## 2013-10-31 DIAGNOSIS — M542 Cervicalgia: Secondary | ICD-10-CM | POA: Insufficient documentation

## 2013-10-31 DIAGNOSIS — Z9889 Other specified postprocedural states: Secondary | ICD-10-CM | POA: Insufficient documentation

## 2013-10-31 DIAGNOSIS — Z7982 Long term (current) use of aspirin: Secondary | ICD-10-CM | POA: Insufficient documentation

## 2013-10-31 DIAGNOSIS — R599 Enlarged lymph nodes, unspecified: Secondary | ICD-10-CM | POA: Insufficient documentation

## 2013-10-31 DIAGNOSIS — R51 Headache: Secondary | ICD-10-CM | POA: Insufficient documentation

## 2013-10-31 DIAGNOSIS — R071 Chest pain on breathing: Secondary | ICD-10-CM | POA: Insufficient documentation

## 2013-10-31 DIAGNOSIS — Z8669 Personal history of other diseases of the nervous system and sense organs: Secondary | ICD-10-CM | POA: Insufficient documentation

## 2013-10-31 HISTORY — DX: Absence epileptic syndrome, not intractable, with status epilepticus: G40.A01

## 2013-10-31 HISTORY — DX: Unspecified convulsions: R56.9

## 2013-10-31 LAB — CBC
HCT: 39 % (ref 36.0–46.0)
Hemoglobin: 13.2 g/dL (ref 12.0–15.0)
MCH: 31.6 pg (ref 26.0–34.0)
MCHC: 33.8 g/dL (ref 30.0–36.0)
MCV: 93.3 fL (ref 78.0–100.0)
Platelets: 270 10*3/uL (ref 150–400)
RBC: 4.18 MIL/uL (ref 3.87–5.11)
RDW: 12.1 % (ref 11.5–15.5)
WBC: 4.8 10*3/uL (ref 4.0–10.5)

## 2013-10-31 LAB — BASIC METABOLIC PANEL
BUN: 8 mg/dL (ref 6–23)
CO2: 26 mEq/L (ref 19–32)
Calcium: 9.3 mg/dL (ref 8.4–10.5)
Chloride: 101 mEq/L (ref 96–112)
Creatinine, Ser: 0.55 mg/dL (ref 0.50–1.10)
GFR calc Af Amer: >90 mL/min (ref >90)
GFR calc non Af Amer: >90 mL/min (ref >90)
Glucose, Bld: 91 mg/dL (ref 70–99)
Potassium: 5.2 mEq/L (ref 3.7–5.3)
Sodium: 140 mEq/L (ref 137–147)

## 2013-10-31 LAB — I-STAT TROPONIN, ED: Troponin i, poc: 0.02 ng/mL (ref 0.00–0.08)

## 2013-10-31 LAB — I-STAT CG4 LACTIC ACID, ED: Lactic Acid, Venous: 1.26 mmol/L (ref 0.5–2.2)

## 2013-10-31 MED ORDER — OXYCODONE HCL 5 MG PO TABS: 5.0000 mg | ORAL_TABLET | Freq: Four times a day (QID) | ORAL | Status: DC | PRN

## 2013-10-31 MED ORDER — DEXAMETHASONE SODIUM PHOSPHATE 10 MG/ML IJ SOLN
10.0000 mg | Freq: Once | INTRAMUSCULAR | Status: AC
  Administered 2013-10-31: 10 mg via INTRAVENOUS
  Filled 2013-10-31: qty 1

## 2013-10-31 MED ORDER — ONDANSETRON HCL 4 MG/2ML IJ SOLN
INTRAMUSCULAR | Status: AC
  Administered 2013-10-31: 4 mg
  Filled 2013-10-31: qty 2

## 2013-10-31 MED ORDER — TRAMADOL HCL 50 MG PO TABS
100.0000 mg | ORAL_TABLET | Freq: Once | ORAL | Status: AC
  Administered 2013-10-31: 100 mg via ORAL
  Filled 2013-10-31: qty 2

## 2013-10-31 MED ORDER — IOHEXOL 350 MG/ML SOLN
80.0000 mL | Freq: Once | INTRAVENOUS | Status: AC | PRN
  Administered 2013-10-31: 80 mL via INTRAVENOUS

## 2013-10-31 MED ORDER — HYDROCODONE-ACETAMINOPHEN 5-325 MG PO TABS: 1.0000 | ORAL_TABLET | Freq: Four times a day (QID) | ORAL | Status: DC | PRN

## 2013-10-31 NOTE — ED Notes (Signed)
Family in room [place] into speak to pt and tell her CT results

## 2013-10-31 NOTE — ED Notes (Signed)
Neuro surgery at bedside.

## 2013-10-31 NOTE — ED Notes (Signed)
Victoria Bates at bedside

## 2013-10-31 NOTE — ED Notes (Signed)
Pt states had brain tumor removed on march 12 and it got infected seen yesterday here at [facility] for debridment  And states she allergic reaction to vanc that was given IV yesterday was given benadryl.  Took more benadryl last night States started to have upper rt chest pain and sob this am has only taken tramdol ,asa and tylenol for pain last taken at 7 am states does not do narcaotics. Asked if dr told her to take asa specifically and she said yes pt states she has sz they are petit mal has them all the time she states sz to bed

## 2013-10-31 NOTE — ED Notes (Signed)
Pt requested  Her own sz med permission given and she took it

## 2013-10-31 NOTE — ED Provider Notes (Addendum)
CSN: [id number]     Arrival date & time 10/31/13  1054 History   First MD Initiated Contact with Patient 10/31/13 1105     Chief Complaint  Patient presents with  . Chest Pain    R lung pain  . Shortness of Breath     (Consider location/radiation/quality/duration/timing/severity/associated sxs/prior Treatment) HPI  33 year old female with history of low grade glioma who had a craniotomy on March 12 who presents complaining of sudden onset of lung pain and shortness of breath. Patient states since she had a craniotomy, she subsequently developed infection at the surgical site. She was told that the infection may be related to her sutures. She had a surgical debridement by Dr. Christella Noa yesterday. After the surgery she was given vancomycin which she developed an allergic reaction including hives. She was given Benadryl which helped. She was ultimately discharge home. This morning she was awoke with sharp chest pain to her right lower chest. Pain is pleuritic, worsening with taking deep breath. She also endorsed secretions of breath along with dyspnea on exertion. She also complaining of significant pain to her neck including neck stiffness and pain behind her right ear. Report having this pain before but it has intensified since last night. No complaints of fever chills. No new rash. Patient requests not to receiving any narcotic pain medication because she is afraid that it may mask the pain. She has no prior history of PE. Aside from high blood pressure (which may related to her hx of seizure) she has no other significant cardiac risk factors. Patient is a non-smoker.   Past Medical History  Diagnosis Date  . Liver hemangioma   . Hypertension   . Anemia   . Headache(784.0)     migraines  . Anxiety   . Asthma     very mild- no inhaler use since July 2013  . SVD (spontaneous vaginal delivery)     x 1  . Complication of anesthesia     takes longer for patient to go under anesthesia  . PONV  (postoperative nausea and vomiting)   . Brain tumor     Hx: of  . Infection     to right head incision   Past Surgical History  Procedure Laterality Date  . Cesarean section    . Coposcopy  08/06/2012    cervical biopsy  . Left knee surgery  2000    torn meniscus  . Right flank surgery  2005    bnign mass from flank area-right  . Esophagogastroduodenoscopy (egd) with propofol N/A 08/29/2012    Procedure: ESOPHAGOGASTRODUODENOSCOPY (EGD) WITH PROPOFOL;  Surgeon: Arta Silence, MD;  Location: [facility];  Service: Endoscopy;  Laterality: N/A;  . Colonoscopy with propofol N/A 08/29/2012    Procedure: COLONOSCOPY WITH PROPOFOL;  Surgeon: Arta Silence, MD;  Location: [facility];  Service: Endoscopy;  Laterality: N/A;  . Wisdom tooth extraction    . Robotic assisted total hysterectomy with bilateral salpingo oopherectomy Bilateral 09/25/2012    Procedure: ROBOTIC ASSISTED TOTAL HYSTERECTOMY WITH BILATERAL SALPINGO OOPHORECTOMY;  Surgeon: Maeola Sarah. Landry Mellow, MD;  Location: [facility];  Service: Gynecology;  Laterality: Bilateral;  . Abdominal hysterectomy    . Craniotomy Right 09/26/2013    Procedure: Awake Right frontal open brain biopsy;  Surgeon: Winfield Cunas, MD;  Location: [facility];  Service: Neurosurgery;  Laterality: Right;  awake Right frontal open brain biopsy  . Craniotomy N/A 10/30/2013    Procedure: debridement of cranial wound  right temporal incision, ;  Surgeon: Winfield Cunas, MD;  Location: [facility];  Service: Neurosurgery;  Laterality: N/A;   Family History  Problem Relation Age of Onset  . Hypertension Mother    History  Substance Use Topics  . Smoking status: Never Smoker   . Smokeless tobacco: Never Used  . Alcohol Use: No   OB History   Grav Para Term Preterm Abortions TAB SAB Ect Mult Living                 Review of Systems  Respiratory: Positive for shortness of breath. Negative for cough.   Musculoskeletal: Positive for neck pain and neck stiffness.  Skin:  Negative for rash.  Neurological: Positive for headaches. Negative for numbness.  All other systems reviewed and are negative.     Allergies  Adhesive; Latex; Percocet; Shellfish allergy; Soma; and Vancomycin  Home Medications   Prior to Admission medications   Medication Sig Start Date End Date Taking? Authorizing Provider  acetaminophen (TYLENOL) 500 MG tablet Take 1,000 mg by mouth 2 (two) times daily as needed.     Historical Provider, MD  albuterol (PROVENTIL HFA;VENTOLIN HFA) 108 (90 BASE) MCG/ACT inhaler Inhale 2 puffs into the lungs every 6 (six) hours as needed for wheezing (in summer time - occasional use only).    Historical Provider, MD  ALPRAZolam [name] 1 MG tablet Take 1 mg by mouth 3 (three) times daily as needed for anxiety.     Historical Provider, MD  aspirin 325 MG tablet Take 325 mg by mouth daily.    Historical Provider, MD  carisoprodol (SOMA) 350 MG tablet Take 350 mg by mouth 4 (four) times daily.     Historical Provider, MD  ciprofloxacin (CIPRO) 500 MG tablet Take 1 tablet (500 mg total) by mouth 2 (two) times daily. 10/30/13   Winfield Cunas, MD  levETIRAcetam (KEPPRA) 500 MG tablet Take 500 mg by mouth 3 (three) times daily. 09/21/13   Winfield Cunas, MD  mupirocin ointment (BACTROBAN) 2 % Place into the nose 2 (two) times daily. 10/30/13   Winfield Cunas, MD  traMADol (ULTRAM) 50 MG tablet Take 50-100 mg by mouth every 6 (six) hours as needed for severe pain (for pain).     Historical Provider, MD  traMADol (ULTRAM) 50 MG tablet Take 1 tablet (50 mg total) by mouth every 6 (six) hours as needed. 10/30/13   Winfield Cunas, MD   BP 128/85  Pulse 95  Temp(Src) 97.8 F (36.6 C) (Oral)  Resp 15  Ht 5\' 8"  (1.727 m)  Wt 142 lb (64.411 kg)  BMI 21.60 kg/m2  SpO2 99%  LMP 09/20/2012 Physical Exam  Nursing note and vitals reviewed. Constitutional: She appears well-developed and well-nourished. No distress.  HENT:  Head: Normocephalic.   5cm vertical surgical  scar noted to R temporal region with sutures in place.  Does not appear infected  Eyes: Conjunctivae are normal.  Neck: Neck supple.  Tender lymphadenopathy to R posterior auricular lymph node.    No obvious nuchal rigidity  Cardiovascular:  Mild tachycardia with M/R/G  Pulmonary/Chest:  Poor effort but without wheezes, rales or rhonchi  Abdominal: Soft. There is tenderness (epigastric tenderness to palpation, otherwise abdomen nondistended).  Neurological: She is alert. She has normal strength. She displays no tremor. No cranial nerve deficit or sensory deficit. She displays no seizure activity. GCS eye subscore is 4. GCS verbal subscore is 5. GCS motor subscore is 6.  Skin: No rash noted.  Psychiatric:  She has a normal mood and affect.    ED Course  Procedures (including critical care time)  11:41 AM Pt with c/o of pleuritic CP in the setting of recent surgery which is concerning for PE.  She is mildly tachycardic.  Plan to obtain cxr and subsequently chest CTA for further evaluation.  Does have neck pain, but at the site of a reactive lymph node.  No significant meningismal sign to suggest meningitis.  Pt appears nontoxic, sitting up in a brightly room without any acute distress.  Will consult Dr. Christella Noa.    2:40 PM Chest CTA without evidence of PE or any acute pathology. Normal lactic acid, normal H&H and normal WBC with normal electrolytes. Patient is afebrile with stable normal vital signs. She does complain of pain, trauma not given. I have consult neurosurgeon on call Dr. Alvie Heidelberg, who recommend giving pt decadron 10mg  IV and have pt follow up closely with Dr. Christella Noa outpt.  Care discussed with Dr. Rogene Houston.  3:28 PM Prior to discharge pt voice concern of worsening headache and worries of infection.  I discusses the possibility of meningitis given the worsening headache.  i offer to perform a spinal tap to assess CSF for signs of meningitis. I also discussed the risk/benefit of  an LP. However pt declined LP.  Therefore, i strongly suggest close follow up with her neurosurgeon for further management.    6:52 PM oncall neurosurgeon Dr. Alvie Heidelberg saw pt in ER and address her concerns.  He does not think MRI is indicative at this time.  He has low concerns for meningitis.  He recommend given pain medication and have close f/u with Dr. Christella Noa.  Labs Review Labs Reviewed  CBC  BASIC METABOLIC PANEL  I-STAT TROPOININ, ED  I-STAT CG4 LACTIC ACID, ED    Imaging Review Dg Chest 2 View  10/31/2013   CLINICAL DATA:  Chest pain, shortness of breath  EXAM: CHEST  2 VIEW  COMPARISON:  09/06/2013  FINDINGS: The heart size and mediastinal contours are within normal limits. Both lungs are clear. The visualized skeletal structures are unremarkable. Stable thoracic scoliosis.  IMPRESSION: No active cardiopulmonary disease.   Electronically Signed   By: Daryll Brod M.D.   On: 10/31/2013 12:16   Ct Angio Chest Pe W/cm &/or Wo Cm  10/31/2013   CLINICAL DATA:  Patient had a craniotomy on 09/26/2013 with subsequent infection, and more recent debridement. Reports a sudden onset of right chest pain and shortness of breath last night.  EXAM: CT ANGIOGRAPHY CHEST WITH CONTRAST  TECHNIQUE: Multidetector CT imaging of the chest was performed using the standard protocol during bolus administration of intravenous contrast. Multiplanar CT image reconstructions and MIPs were obtained to evaluate the vascular anatomy.  CONTRAST:  52mL OMNIPAQUE IOHEXOL 350 MG/ML SOLN  COMPARISON:  Current chest radiograph  FINDINGS: There is no evidence of a pulmonary embolus.  Heart, mediastinum, great vessels and hila are unremarkable.  Lungs are clear.  No pleural effusion.  A pneumothorax.  Mild dextroscoliosis of the thoracic spine. Bony structures are otherwise unremarkable.  Review of the MIP images confirms the above findings.  IMPRESSION: 1. No evidence of a pulmonary embolus. 2. No acute findings. 3. Mild  thoracic dextroscoliosis. 4. No other abnormalities.   Electronically Signed   By: Lajean Manes M.D.   On: 10/31/2013 14:03     EKG Interpretation None      Date: 10/31/2013  Rate: 88  Rhythm: normal sinus rhythm with sinus arrhythmia  QRS Axis: normal  Intervals: normal  ST/T Wave abnormalities: normal  Conduction Disutrbances: none  Narrative Interpretation:   Old EKG Reviewed: No significant changes noted     MDM   Final diagnoses:  Pleuritic chest pain    BP 111/74  Pulse 65  Temp(Src) 97.8 F (36.6 C) (Oral)  Resp 20  Ht 5\' 8"  (1.727 m)  Wt 142 lb (64.411 kg)  BMI 21.60 kg/m2  SpO2 99%  LMP 09/20/2012  I have reviewed nursing notes and vital signs. I personally reviewed the imaging tests through PACS system  I reviewed available ER/hospitalization records thought the EMR     Domenic Moras, PA-C 10/31/13 [place], PA-C 10/31/13 [phone]

## 2013-10-31 NOTE — Discharge Instructions (Signed)
Please follow up closely with your neurosurgeon or your primary care doctor for further evaluation of your discomfort.  Take tramadol for pain.  Return if you develop fever, vision changes, numbness, difficulty speaking, rash, or if you have other concerns.  Costochondritis Costochondritis, sometimes called Tietze syndrome, is a swelling and irritation (inflammation) of the tissue (cartilage) that connects your ribs with your breastbone (sternum). It causes pain in the chest and rib area. Costochondritis usually goes away on its own over time. It can take up to 6 weeks or longer to get better, especially if you are unable to limit your activities. CAUSES  Some cases of costochondritis have no known cause. Possible causes include:  Injury (trauma).  Exercise or activity such as lifting.  Severe coughing. SIGNS AND SYMPTOMS  Pain and tenderness in the chest and rib area.  Pain that gets worse when coughing or taking deep breaths.  Pain that gets worse with specific movements. DIAGNOSIS  Your health care provider will do a physical exam and ask about your symptoms. Chest X-rays or other tests may be done to rule out other problems. TREATMENT  Costochondritis usually goes away on its own over time. Your health care provider may prescribe medicine to help relieve pain. HOME CARE INSTRUCTIONS   Avoid exhausting physical activity. Try not to strain your ribs during normal activity. This would include any activities using chest, abdominal, and side muscles, especially if heavy weights are used.  Apply ice to the affected area for the first 2 days after the pain begins.  Put ice in a plastic bag.  Place a towel between your skin and the bag.  Leave the ice on for 20 minutes, 2 3 times a day.  Only take over-the-counter or prescription medicines as directed by your health care provider. SEEK MEDICAL CARE IF:  You have redness or swelling at the rib joints. These are signs of  infection.  Your pain does not go away despite rest or medicine. SEEK IMMEDIATE MEDICAL CARE IF:   Your pain increases or you are very uncomfortable.  You have shortness of breath or difficulty breathing.  You cough up blood.  You have worse chest pains, sweating, or vomiting.  You have a fever or persistent symptoms for more than 2 3 days.  You have a fever and your symptoms suddenly get worse. MAKE SURE YOU:   Understand these instructions.  Will watch your condition.  Will get help right away if you are not doing well or get worse. Document Released: 04/13/2005 Document Revised: 04/24/2013 Document Reviewed: 02/05/2013 [facility] Patient Information 2014 [place].

## 2013-10-31 NOTE — ED Provider Notes (Signed)
Medical screening examination/treatment/procedure(s) were performed by non-physician practitioner and as supervising physician I was immediately available for consultation/collaboration.   EKG Interpretation None        Date: 10/31/2013  Rate: 88  Rhythm: normal sinus rhythm and sinus arrhythmia  QRS Axis: normal  Intervals: normal  ST/T Wave abnormalities: normal  Conduction Disutrbances:none  Narrative Interpretation:   Old EKG Reviewed: none available    Mervin Kung, MD 10/31/13 [phone]

## 2013-10-31 NOTE — ED Notes (Addendum)
Return to room from xray pt given new drsg for head incision rt side of head area is well sutured slightly  red at edges,  No drainage area around incision site   W/o rednes or swelling

## 2013-10-31 NOTE — ED Notes (Signed)
Pt had a craniotomy on 03/12.  Site got infected and she had a debridement and suture change yesterday.  Pt opted not to stay overnight.  Pt reports sudden onset R lung pain and SOB last night.  Pt also reports severe pain behind R ear which is the side of incision.

## 2013-11-01 LAB — WOUND CULTURE: Culture: NO GROWTH

## 2013-11-04 LAB — ANAEROBIC CULTURE

## 2013-11-06 ENCOUNTER — Other Ambulatory Visit (HOSPITAL_COMMUNITY): Payer: Self-pay | Admitting: Neurosurgery

## 2013-11-06 ENCOUNTER — Ambulatory Visit (HOSPITAL_COMMUNITY)
Admission: RE | Admit: 2013-11-06 | Discharge: 2013-11-06 | Disposition: A | Discharge status: Home / Self Care | Payer: Medicaid Other | Source: Ambulatory Visit | Attending: Neurosurgery | Admitting: Neurosurgery

## 2013-11-06 DIAGNOSIS — L0291 Cutaneous abscess, unspecified: Secondary | ICD-10-CM

## 2013-11-06 DIAGNOSIS — C719 Malignant neoplasm of brain, unspecified: Secondary | ICD-10-CM | POA: Insufficient documentation

## 2013-11-06 MED ORDER — IOHEXOL 300 MG/ML  SOLN
100.0000 mL | Freq: Once | INTRAMUSCULAR | Status: AC | PRN
  Administered 2013-11-06: 100 mL via INTRAVENOUS

## 2013-11-13 NOTE — Discharge Summary (Signed)
Discharge Summary Adx: Scalp infection s/p craniotomy Ddx: same Procedure: debridement scalp infection Complications: none Surgeon: Tyrece Vanterpool Discharge disposition: home Meds: Ciprofloxacin Exam: alert, oriented x 4, speech is clear and fluent Perrl, full eom Symmetric facies, tongue and uvula midline Moving all extremities well Wound is clean, dry, no signs of infection.

## 2013-12-12 ENCOUNTER — Other Ambulatory Visit (HOSPITAL_COMMUNITY): Payer: Self-pay | Admitting: Neurosurgery

## 2013-12-12 ENCOUNTER — Other Ambulatory Visit: Payer: Self-pay | Admitting: Neurosurgery

## 2013-12-12 DIAGNOSIS — C719 Malignant neoplasm of brain, unspecified: Secondary | ICD-10-CM

## 2013-12-19 MED ORDER — CEFAZOLIN SODIUM-DEXTROSE 2-3 GM-% IV SOLR: 2.0000 g | INTRAVENOUS | Status: DC

## 2013-12-19 NOTE — Pre-Procedure Instructions (Signed)
CAMBREIGH DEARING  12/19/2013   Your procedure is scheduled on:  Friday, June 19th   Report to [facility] Admitting at  0700 AM.   Call this number if you have problems the morning of surgery: [phone]   Remember:   Do not eat food or drink liquids after midnight Thursday.   Take these medicines the morning of surgery with A SIP OF WATER: Xanax, Keppra, Pain medication.  Please use inhaler.   Do not wear jewelry, make-up or nail polish.  Do not wear lotions, powders, or perfumes. You may NOT wear deodorant.  Do not shave underarms & legs 48 hours prior to surgery.   Do not bring valuables to the hospital.  [facility] is not responsible for any belongings or valuables.               Contacts, dentures or bridgework may not be worn into surgery.  Leave suitcase in the car. After surgery it may be brought to your room.   For patients admitted to the hospital, discharge time is determined by your treatment team.    Name and phone number of your driver:    Special Instructions: "Preparing for Surgery" instruction sheet.   Please read over the following fact sheets that you were given: Pain Booklet, Blood Transfusion Information and Surgical Site Infection Prevention

## 2013-12-20 ENCOUNTER — Inpatient Hospital Stay (HOSPITAL_COMMUNITY)
Admission: RE | Admit: 2013-12-20 | Discharge: 2013-12-20 | Disposition: A | Discharge status: Home / Self Care | Payer: Medicaid Other | Source: Ambulatory Visit

## 2013-12-20 ENCOUNTER — Encounter (HOSPITAL_COMMUNITY): Payer: Self-pay | Admitting: Pharmacy Technician

## 2013-12-20 NOTE — Progress Notes (Signed)
Left message regarding missed PAT appointment

## 2013-12-26 ENCOUNTER — Ambulatory Visit (HOSPITAL_COMMUNITY)
Admission: RE | Admit: 2013-12-26 | Discharge: 2013-12-26 | Disposition: A | Discharge status: Home / Self Care | Payer: Medicaid Other | Source: Ambulatory Visit | Attending: Neurosurgery | Admitting: Neurosurgery

## 2013-12-26 DIAGNOSIS — C719 Malignant neoplasm of brain, unspecified: Secondary | ICD-10-CM | POA: Insufficient documentation

## 2013-12-26 DIAGNOSIS — G9389 Other specified disorders of brain: Secondary | ICD-10-CM | POA: Diagnosis not present

## 2013-12-26 MED ORDER — GADOBENATE DIMEGLUMINE 529 MG/ML IV SOLN
15.0000 mL | Freq: Once | INTRAVENOUS | Status: AC
  Administered 2013-12-26: 15 mL via INTRAVENOUS

## 2013-12-31 ENCOUNTER — Encounter (HOSPITAL_COMMUNITY): Payer: Self-pay

## 2013-12-31 ENCOUNTER — Encounter (HOSPITAL_COMMUNITY): Payer: Self-pay | Admitting: Certified Registered"

## 2013-12-31 ENCOUNTER — Encounter (HOSPITAL_COMMUNITY)
Admission: RE | Admit: 2013-12-31 | Discharge: 2013-12-31 | Disposition: A | Discharge status: Home / Self Care | Payer: Medicaid Other | Source: Ambulatory Visit | Attending: Neurosurgery | Admitting: Neurosurgery

## 2013-12-31 DIAGNOSIS — Z01812 Encounter for preprocedural laboratory examination: Secondary | ICD-10-CM | POA: Diagnosis present

## 2013-12-31 DIAGNOSIS — Z01810 Encounter for preprocedural cardiovascular examination: Secondary | ICD-10-CM | POA: Diagnosis not present

## 2013-12-31 HISTORY — DX: Dizziness and giddiness: R42

## 2013-12-31 HISTORY — DX: Malignant (primary) neoplasm, unspecified: C80.1

## 2013-12-31 LAB — CBC
HCT: 36.7 % (ref 36.0–46.0)
Hemoglobin: 12.1 g/dL (ref 12.0–15.0)
MCH: 30.3 pg (ref 26.0–34.0)
MCHC: 33 g/dL (ref 30.0–36.0)
MCV: 92 fL (ref 78.0–100.0)
Platelets: 232 10*3/uL (ref 150–400)
RBC: 3.99 MIL/uL (ref 3.87–5.11)
RDW: 11.9 % (ref 11.5–15.5)
WBC: 3.9 10*3/uL — ABNORMAL LOW (ref 4.0–10.5)

## 2013-12-31 LAB — BASIC METABOLIC PANEL
BUN: 10 mg/dL (ref 6–23)
CO2: 26 mEq/L (ref 19–32)
Calcium: 9.5 mg/dL (ref 8.4–10.5)
Chloride: 102 mEq/L (ref 96–112)
Creatinine, Ser: 0.53 mg/dL (ref 0.50–1.10)
GFR calc Af Amer: >90 mL/min (ref >90)
GFR calc non Af Amer: >90 mL/min (ref >90)
Glucose, Bld: 92 mg/dL (ref 70–99)
Potassium: 4.9 mEq/L (ref 3.7–5.3)
Sodium: 139 mEq/L (ref 137–147)

## 2013-12-31 LAB — TYPE AND SCREEN
ABO/RH(D): A NEG
Antibody Screen: NEGATIVE

## 2013-12-31 NOTE — Progress Notes (Signed)
Primary physician - dr. Jenny Reichmann griffin Does not have cardiologist ekg in epic

## 2014-01-02 MED ORDER — CEFAZOLIN SODIUM-DEXTROSE 2-3 GM-% IV SOLR: 2.0000 g | INTRAVENOUS | Status: DC

## 2014-01-03 ENCOUNTER — Encounter (HOSPITAL_COMMUNITY): Admission: RE | Payer: Self-pay | Source: Ambulatory Visit

## 2014-01-03 ENCOUNTER — Inpatient Hospital Stay (HOSPITAL_COMMUNITY): Admission: RE | Admit: 2014-01-03 | Payer: Medicaid Other | Source: Ambulatory Visit | Admitting: Neurosurgery

## 2014-01-03 SURGERY — CRANIOTOMY TUMOR EXCISION
Anesthesia: Monitor Anesthesia Care

## 2014-01-09 ENCOUNTER — Other Ambulatory Visit (HOSPITAL_COMMUNITY): Payer: Self-pay | Admitting: Neurosurgery

## 2014-01-09 DIAGNOSIS — D496 Neoplasm of unspecified behavior of brain: Secondary | ICD-10-CM

## 2014-01-10 ENCOUNTER — Ambulatory Visit (HOSPITAL_COMMUNITY): Admission: RE | Admit: 2014-01-10 | Payer: Medicaid Other | Source: Ambulatory Visit

## 2014-01-13 ENCOUNTER — Other Ambulatory Visit: Payer: Self-pay | Admitting: Neurosurgery

## 2014-01-13 ENCOUNTER — Other Ambulatory Visit (HOSPITAL_COMMUNITY): Payer: Self-pay | Admitting: Neurosurgery

## 2014-01-13 DIAGNOSIS — C719 Malignant neoplasm of brain, unspecified: Secondary | ICD-10-CM

## 2014-01-15 ENCOUNTER — Encounter (HOSPITAL_COMMUNITY): Payer: Self-pay | Admitting: Pharmacy Technician

## 2014-01-15 NOTE — Pre-Procedure Instructions (Signed)
Victoria Bates  01/15/2014   Your procedure is scheduled on: Thursday, January 23, 2014   Report to [facility] Admitting at 7:00 AM.   Call this number if you have problems the morning of surgery: [phone]   Remember:   Do not eat food or drink liquids after midnight Wednesday, January 22, 2014   Take these medicines the morning of surgery with A SIP OF WATER: carisoprodol (SOMA), levETIRAcetam (KEPPRA), if needed: pain medication, ALPRAZolam [name] for seizures, albuterol (PROVENTIL HFA;VENTOLIN HFA) inhaler ( Bring inhaler on day of procedure).   Stop taking Aspirin, vitamins, and herbal medications. Do not take any NSAIDs ie: Ibuprofen, Advil, Naproxen or any medication containing Aspirin ( diclofenac (VOLTAREN); stop 5 days prior to procedure (Sat. 01/18/14).   Do not wear jewelry, make-up or nail polish.  Do not wear lotions, powders, or perfumes. You may NOT wear deodorant.  Do not shave 48 hours prior to surgery.   Do not bring valuables to the hospital.  [facility] is not responsible for any belongings or valuables.               Contacts, dentures or bridgework may not be worn into surgery.  Leave suitcase in the car. After surgery it may be brought to your room.  For patients admitted to the hospital, discharge time is determined by your treatment team.               Patients discharged the day of surgery will not be allowed to drive home.     Special Instructions:  Special Instructions:Special Instructions: [place] - Preparing for Surgery  Before surgery, you can play an important role.  Because skin is not sterile, your skin needs to be as free of germs as possible.  You can reduce the number of germs on you skin by washing with CHG (chlorahexidine gluconate) soap before surgery.  CHG is an antiseptic cleaner which kills germs and bonds with the skin to continue killing germs even after washing.  Please DO NOT use if you have an allergy to CHG or antibacterial  soaps.  If your skin becomes reddened/irritated stop using the CHG and inform your nurse when you arrive at Short Stay.  Do not shave (including legs and underarms) for at least 48 hours prior to the first CHG shower.  You may shave your face.  Please follow these instructions carefully:   1.  Shower with CHG Soap the night before surgery and the morning of Surgery.  2.  If you choose to wash your hair, wash your hair first as usual with your normal shampoo.  3.  After you shampoo, rinse your hair and body thoroughly to remove the Shampoo.  4.  Use CHG as you would any other liquid soap.  You can apply chg directly  to the skin and wash gently with scrungie or a clean washcloth.  5.  Apply the CHG Soap to your body ONLY FROM THE NECK DOWN.  Do not use on open wounds or open sores.  Avoid contact with your eyes, ears, mouth and genitals (private parts).  Wash genitals (private parts) with your normal soap.  6.  Wash thoroughly, paying special attention to the area where your surgery will be performed.  7.  Thoroughly rinse your body with warm water from the neck down.  8.  DO NOT shower/wash with your normal soap after using and rinsing off the CHG Soap.  9.  Pat yourself dry  with a clean towel.            10.  Wear clean pajamas.            11.  Place clean sheets on your bed the night of your first shower and do not sleep with pets.  Day of Surgery  Do not apply any lotions/deodorants the morning of surgery.  Please wear clean clothes to the hospital/surgery center.   Please read over the following fact sheets that you were given: Pain Booklet, Blood Transfusion Information and Surgical Site Infection Prevention

## 2014-01-16 ENCOUNTER — Encounter (HOSPITAL_COMMUNITY)
Admission: RE | Admit: 2014-01-16 | Discharge: 2014-01-16 | Disposition: A | Discharge status: Home / Self Care | Payer: Medicaid Other | Source: Ambulatory Visit | Attending: Neurosurgery | Admitting: Neurosurgery

## 2014-01-16 ENCOUNTER — Ambulatory Visit (HOSPITAL_COMMUNITY)
Admission: RE | Admit: 2014-01-16 | Discharge: 2014-01-16 | Disposition: A | Discharge status: Home / Self Care | Payer: Medicaid Other | Source: Ambulatory Visit | Attending: Neurosurgery | Admitting: Neurosurgery

## 2014-01-16 ENCOUNTER — Encounter (HOSPITAL_COMMUNITY): Payer: Self-pay

## 2014-01-16 DIAGNOSIS — C719 Malignant neoplasm of brain, unspecified: Secondary | ICD-10-CM | POA: Insufficient documentation

## 2014-01-16 LAB — CBC
HCT: 37.4 % (ref 36.0–46.0)
Hemoglobin: 12.5 g/dL (ref 12.0–15.0)
MCH: 30.8 pg (ref 26.0–34.0)
MCHC: 33.4 g/dL (ref 30.0–36.0)
MCV: 92.1 fL (ref 78.0–100.0)
Platelets: 216 10*3/uL (ref 150–400)
RBC: 4.06 MIL/uL (ref 3.87–5.11)
RDW: 11.7 % (ref 11.5–15.5)
WBC: 3.5 10*3/uL — ABNORMAL LOW (ref 4.0–10.5)

## 2014-01-16 LAB — BASIC METABOLIC PANEL
Anion gap: 11 (ref 5–15)
BUN: 9 mg/dL (ref 6–23)
CO2: 26 mEq/L (ref 19–32)
Calcium: 9.4 mg/dL (ref 8.4–10.5)
Chloride: 101 mEq/L (ref 96–112)
Creatinine, Ser: 0.58 mg/dL (ref 0.50–1.10)
GFR calc Af Amer: >90 mL/min (ref >90)
GFR calc non Af Amer: >90 mL/min (ref >90)
Glucose, Bld: 90 mg/dL (ref 70–99)
Potassium: 5.1 mEq/L (ref 3.7–5.3)
Sodium: 138 mEq/L (ref 137–147)

## 2014-01-16 LAB — TYPE AND SCREEN
ABO/RH(D): A NEG
Antibody Screen: NEGATIVE

## 2014-01-22 MED ORDER — CEFAZOLIN SODIUM-DEXTROSE 2-3 GM-% IV SOLR
2.0000 g | INTRAVENOUS | Status: AC
  Administered 2014-01-23 (×2): 2 g via INTRAVENOUS
  Filled 2014-01-22: qty 50

## 2014-01-23 ENCOUNTER — Encounter (HOSPITAL_COMMUNITY): Payer: Medicaid Other | Admitting: Certified Registered Nurse Anesthetist

## 2014-01-23 ENCOUNTER — Inpatient Hospital Stay (HOSPITAL_COMMUNITY): Payer: Medicaid Other | Admitting: Certified Registered Nurse Anesthetist

## 2014-01-23 ENCOUNTER — Inpatient Hospital Stay (HOSPITAL_COMMUNITY)
Admission: RE | Admit: 2014-01-23 | Discharge: 2014-01-27 | DRG: 027 | Disposition: A | Discharge status: Home / Self Care | Payer: Medicaid Other | Source: Ambulatory Visit | Attending: Neurosurgery | Admitting: Neurosurgery

## 2014-01-23 ENCOUNTER — Encounter (HOSPITAL_COMMUNITY): Payer: Self-pay | Admitting: Certified Registered Nurse Anesthetist

## 2014-01-23 ENCOUNTER — Encounter (HOSPITAL_COMMUNITY)
Admission: RE | Disposition: A | Discharge status: Home / Self Care | Payer: Self-pay | Source: Ambulatory Visit | Attending: Neurosurgery

## 2014-01-23 DIAGNOSIS — C719 Malignant neoplasm of brain, unspecified: Secondary | ICD-10-CM | POA: Diagnosis present

## 2014-01-23 DIAGNOSIS — F411 Generalized anxiety disorder: Secondary | ICD-10-CM | POA: Diagnosis not present

## 2014-01-23 DIAGNOSIS — Z79899 Other long term (current) drug therapy: Secondary | ICD-10-CM | POA: Diagnosis not present

## 2014-01-23 DIAGNOSIS — R29810 Facial weakness: Secondary | ICD-10-CM | POA: Diagnosis not present

## 2014-01-23 DIAGNOSIS — C711 Malignant neoplasm of frontal lobe: Principal | ICD-10-CM | POA: Diagnosis present

## 2014-01-23 DIAGNOSIS — Z91013 Allergy to seafood: Secondary | ICD-10-CM

## 2014-01-23 DIAGNOSIS — Z888 Allergy status to other drugs, medicaments and biological substances status: Secondary | ICD-10-CM | POA: Diagnosis not present

## 2014-01-23 DIAGNOSIS — Z8249 Family history of ischemic heart disease and other diseases of the circulatory system: Secondary | ICD-10-CM | POA: Diagnosis not present

## 2014-01-23 DIAGNOSIS — Z91040 Latex allergy status: Secondary | ICD-10-CM | POA: Diagnosis not present

## 2014-01-23 DIAGNOSIS — Z7982 Long term (current) use of aspirin: Secondary | ICD-10-CM

## 2014-01-23 DIAGNOSIS — Z881 Allergy status to other antibiotic agents status: Secondary | ICD-10-CM

## 2014-01-23 DIAGNOSIS — J45909 Unspecified asthma, uncomplicated: Secondary | ICD-10-CM | POA: Diagnosis present

## 2014-01-23 HISTORY — PX: CRANIOTOMY: SHX93

## 2014-01-23 LAB — MRSA PCR SCREENING: MRSA by PCR: NEGATIVE

## 2014-01-23 SURGERY — CRANIOTOMY TUMOR EXCISION
Anesthesia: Monitor Anesthesia Care

## 2014-01-23 MED ORDER — PROPOFOL 10 MG/ML IV BOLUS
INTRAVENOUS | Status: DC | PRN
  Administered 2014-01-23: 30 mg via INTRAVENOUS
  Administered 2014-01-23: 20 mg via INTRAVENOUS
  Administered 2014-01-23: 50 mg via INTRAVENOUS
  Administered 2014-01-23: 30 mg via INTRAVENOUS
  Administered 2014-01-23 (×3): 20 mg via INTRAVENOUS
  Administered 2014-01-23 (×3): 30 mg via INTRAVENOUS
  Administered 2014-01-23: 50 mg via INTRAVENOUS
  Administered 2014-01-23: 30 mg via INTRAVENOUS
  Administered 2014-01-23: 40 mg via INTRAVENOUS
  Administered 2014-01-23: 50 mg via INTRAVENOUS
  Administered 2014-01-23: 20 mg via INTRAVENOUS
  Administered 2014-01-23 (×2): 50 mg via INTRAVENOUS
  Administered 2014-01-23: 20 mg via INTRAVENOUS
  Administered 2014-01-23: 80 mg via INTRAVENOUS
  Administered 2014-01-23: 50 mg via INTRAVENOUS
  Administered 2014-01-23: 30 mg via INTRAVENOUS
  Administered 2014-01-23: 20 mg via INTRAVENOUS
  Administered 2014-01-23: 30 mg via INTRAVENOUS

## 2014-01-23 MED ORDER — MORPHINE SULFATE 2 MG/ML IJ SOLN
1.0000 mg | INTRAMUSCULAR | Status: DC | PRN
  Administered 2014-01-23: 1 mg via INTRAVENOUS
  Administered 2014-01-23: 2 mg via INTRAVENOUS
  Administered 2014-01-23: 1 mg via INTRAVENOUS
  Administered 2014-01-24 – 2014-01-27 (×14): 2 mg via INTRAVENOUS
  Filled 2014-01-23 (×17): qty 1

## 2014-01-23 MED ORDER — TRAMADOL HCL 50 MG PO TABS
50.0000 mg | ORAL_TABLET | Freq: Four times a day (QID) | ORAL | Status: DC | PRN
  Administered 2014-01-23 – 2014-01-27 (×6): 50 mg via ORAL
  Filled 2014-01-23 (×6): qty 1

## 2014-01-23 MED ORDER — POTASSIUM CHLORIDE IN NACL 20-0.9 MEQ/L-% IV SOLN
INTRAVENOUS | Status: DC
  Administered 2014-01-23 – 2014-01-27 (×5): via INTRAVENOUS
  Filled 2014-01-23 (×9): qty 1000

## 2014-01-23 MED ORDER — SODIUM CHLORIDE 0.9 % IV SOLN
INTRAVENOUS | Status: DC | PRN
Start: 2014-01-23 — End: 2014-01-23
  Administered 2014-01-23 (×4): via INTRAVENOUS

## 2014-01-23 MED ORDER — ACETAMINOPHEN 500 MG PO TABS: 500.0000 mg | ORAL_TABLET | Freq: Three times a day (TID) | ORAL | Status: DC | PRN

## 2014-01-23 MED ORDER — FENTANYL CITRATE 0.05 MG/ML IJ SOLN
INTRAMUSCULAR | Status: AC
  Filled 2014-01-23: qty 5

## 2014-01-23 MED ORDER — FENTANYL CITRATE 0.05 MG/ML IJ SOLN
INTRAMUSCULAR | Status: DC | PRN
  Administered 2014-01-23 (×2): 50 ug via INTRAVENOUS
  Administered 2014-01-23: 100 ug via INTRAVENOUS
  Administered 2014-01-23: 50 ug via INTRAVENOUS
  Administered 2014-01-23: 25 ug via INTRAVENOUS
  Administered 2014-01-23 (×2): 50 ug via INTRAVENOUS
  Administered 2014-01-23: 25 ug via INTRAVENOUS
  Administered 2014-01-23 (×3): 50 ug via INTRAVENOUS
  Administered 2014-01-23: 100 ug via INTRAVENOUS
  Administered 2014-01-23 (×4): 50 ug via INTRAVENOUS

## 2014-01-23 MED ORDER — LABETALOL HCL 5 MG/ML IV SOLN: 10.0000 mg | INTRAVENOUS | Status: DC | PRN

## 2014-01-23 MED ORDER — PHENYLEPHRINE 40 MCG/ML (10ML) SYRINGE FOR IV PUSH (FOR BLOOD PRESSURE SUPPORT)
PREFILLED_SYRINGE | INTRAVENOUS | Status: AC
  Filled 2014-01-23: qty 10

## 2014-01-23 MED ORDER — ONDANSETRON HCL 4 MG PO TABS: 4.0000 mg | ORAL_TABLET | ORAL | Status: DC | PRN

## 2014-01-23 MED ORDER — PROPOFOL 10 MG/ML IV BOLUS
INTRAVENOUS | Status: AC
  Filled 2014-01-23: qty 20

## 2014-01-23 MED ORDER — FENTANYL CITRATE 0.05 MG/ML IJ SOLN: 25.0000 ug | INTRAMUSCULAR | Status: DC | PRN

## 2014-01-23 MED ORDER — PROMETHAZINE HCL 25 MG/ML IJ SOLN
6.2500 mg | Freq: Once | INTRAMUSCULAR | Status: AC
  Administered 2014-01-23: 6.25 mg via INTRAVENOUS

## 2014-01-23 MED ORDER — SODIUM CHLORIDE 0.9 % IR SOLN
Status: DC | PRN
  Administered 2014-01-23 (×3): 1000 mL

## 2014-01-23 MED ORDER — THROMBIN 20000 UNITS EX SOLR
CUTANEOUS | Status: DC | PRN
  Administered 2014-01-23: 12:00:00 via TOPICAL

## 2014-01-23 MED ORDER — SENNA 8.6 MG PO TABS
1.0000 | ORAL_TABLET | Freq: Two times a day (BID) | ORAL | Status: DC
  Administered 2014-01-23 – 2014-01-27 (×8): 8.6 mg via ORAL
  Filled 2014-01-23 (×9): qty 1

## 2014-01-23 MED ORDER — GLYCOPYRROLATE 0.2 MG/ML IJ SOLN
INTRAMUSCULAR | Status: AC
  Filled 2014-01-23: qty 2

## 2014-01-23 MED ORDER — ROCURONIUM BROMIDE 50 MG/5ML IV SOLN
INTRAVENOUS | Status: AC
  Filled 2014-01-23: qty 1

## 2014-01-23 MED ORDER — POLYETHYLENE GLYCOL 3350 17 G PO PACK
17.0000 g | PACK | Freq: Every day | ORAL | Status: DC | PRN
  Filled 2014-01-23: qty 1

## 2014-01-23 MED ORDER — DEXAMETHASONE 4 MG PO TABS: 4.0000 mg | ORAL_TABLET | Freq: Three times a day (TID) | ORAL | Status: DC

## 2014-01-23 MED ORDER — ALPRAZOLAM 0.5 MG PO TABS
1.0000 mg | ORAL_TABLET | Freq: Two times a day (BID) | ORAL | Status: DC | PRN
  Administered 2014-01-24 – 2014-01-27 (×3): 1 mg via ORAL
  Filled 2014-01-23 (×4): qty 2

## 2014-01-23 MED ORDER — KETOROLAC TROMETHAMINE 15 MG/ML IJ SOLN
15.0000 mg | Freq: Four times a day (QID) | INTRAMUSCULAR | Status: AC
  Administered 2014-01-23 – 2014-01-25 (×5): 15 mg via INTRAVENOUS
  Filled 2014-01-23 (×8): qty 1

## 2014-01-23 MED ORDER — DEXAMETHASONE SODIUM PHOSPHATE 10 MG/ML IJ SOLN
INTRAMUSCULAR | Status: DC | PRN
  Administered 2014-01-23: 10 mg via INTRAVENOUS

## 2014-01-23 MED ORDER — LIDOCAINE-EPINEPHRINE 0.5 %-1:200000 IJ SOLN
INTRAMUSCULAR | Status: DC | PRN
  Administered 2014-01-23: 42.5 mL via INTRADERMAL
  Administered 2014-01-23: 36 mL via INTRADERMAL

## 2014-01-23 MED ORDER — LACTATED RINGERS IV SOLN
INTRAVENOUS | Status: DC
  Administered 2014-01-23: 08:00:00 via INTRAVENOUS

## 2014-01-23 MED ORDER — NALOXONE HCL 0.4 MG/ML IJ SOLN: 0.0800 mg | INTRAMUSCULAR | Status: DC | PRN

## 2014-01-23 MED ORDER — MAGNESIUM CITRATE PO SOLN
1.0000 | Freq: Once | ORAL | Status: AC | PRN
  Filled 2014-01-23: qty 296

## 2014-01-23 MED ORDER — DEXAMETHASONE 6 MG PO TABS
6.0000 mg | ORAL_TABLET | Freq: Four times a day (QID) | ORAL | Status: DC
  Administered 2014-01-23 – 2014-01-24 (×3): 6 mg via ORAL
  Filled 2014-01-23 (×4): qty 1

## 2014-01-23 MED ORDER — LEVETIRACETAM 500 MG PO TABS
500.0000 mg | ORAL_TABLET | Freq: Three times a day (TID) | ORAL | Status: DC
  Administered 2014-01-23 – 2014-01-27 (×12): 500 mg via ORAL
  Filled 2014-01-23 (×14): qty 1

## 2014-01-23 MED ORDER — MIDAZOLAM HCL 5 MG/5ML IJ SOLN
INTRAMUSCULAR | Status: DC | PRN
Start: 2014-01-23 — End: 2014-01-23
  Administered 2014-01-23 (×2): 1 mg via INTRAVENOUS

## 2014-01-23 MED ORDER — MICROFIBRILLAR COLL HEMOSTAT EX PADS
MEDICATED_PAD | CUTANEOUS | Status: DC | PRN
  Administered 2014-01-23: 1 via TOPICAL

## 2014-01-23 MED ORDER — ONDANSETRON HCL 4 MG/2ML IJ SOLN
INTRAMUSCULAR | Status: AC
Start: 2014-01-23 — End: 2014-01-23
  Filled 2014-01-23: qty 2

## 2014-01-23 MED ORDER — DEXAMETHASONE 4 MG PO TABS
4.0000 mg | ORAL_TABLET | Freq: Four times a day (QID) | ORAL | Status: DC
  Filled 2014-01-23 (×3): qty 1

## 2014-01-23 MED ORDER — BUPIVACAINE HCL (PF) 0.5 % IJ SOLN
INTRAMUSCULAR | Status: DC | PRN
  Administered 2014-01-23: 8 mL

## 2014-01-23 MED ORDER — CARISOPRODOL 350 MG PO TABS
350.0000 mg | ORAL_TABLET | Freq: Four times a day (QID) | ORAL | Status: DC
  Administered 2014-01-23 – 2014-01-27 (×15): 350 mg via ORAL
  Filled 2014-01-23 (×15): qty 1

## 2014-01-23 MED ORDER — PANTOPRAZOLE SODIUM 40 MG IV SOLR
40.0000 mg | Freq: Every day | INTRAVENOUS | Status: DC
  Administered 2014-01-23: 40 mg via INTRAVENOUS
  Filled 2014-01-23 (×2): qty 40

## 2014-01-23 MED ORDER — LIDOCAINE HCL (CARDIAC) 20 MG/ML IV SOLN
INTRAVENOUS | Status: DC | PRN
  Administered 2014-01-23: 80 mg via INTRAVENOUS

## 2014-01-23 MED ORDER — EPHEDRINE SULFATE 50 MG/ML IJ SOLN
INTRAMUSCULAR | Status: AC
  Filled 2014-01-23: qty 1

## 2014-01-23 MED ORDER — STERILE WATER FOR INJECTION IJ SOLN
INTRAMUSCULAR | Status: AC
  Filled 2014-01-23: qty 10

## 2014-01-23 MED ORDER — ALBUTEROL SULFATE (2.5 MG/3ML) 0.083% IN NEBU: 3.0000 mL | INHALATION_SOLUTION | Freq: Four times a day (QID) | RESPIRATORY_TRACT | Status: DC | PRN

## 2014-01-23 MED ORDER — ONDANSETRON HCL 4 MG/2ML IJ SOLN
4.0000 mg | INTRAMUSCULAR | Status: DC | PRN
  Administered 2014-01-23 – 2014-01-26 (×8): 4 mg via INTRAVENOUS
  Filled 2014-01-23 (×9): qty 2

## 2014-01-23 MED ORDER — BACITRACIN ZINC 500 UNIT/GM EX OINT
TOPICAL_OINTMENT | CUTANEOUS | Status: DC | PRN
  Administered 2014-01-23: 1 via TOPICAL

## 2014-01-23 MED ORDER — PROMETHAZINE HCL 25 MG PO TABS: 12.5000 mg | ORAL_TABLET | ORAL | Status: DC | PRN

## 2014-01-23 MED ORDER — MIDAZOLAM HCL 2 MG/2ML IJ SOLN
INTRAMUSCULAR | Status: AC
  Filled 2014-01-23: qty 2

## 2014-01-23 MED ORDER — TEMAZEPAM 7.5 MG PO CAPS
30.0000 mg | ORAL_CAPSULE | Freq: Every evening | ORAL | Status: DC | PRN
  Administered 2014-01-25 – 2014-01-26 (×2): 30 mg via ORAL
  Filled 2014-01-23 (×2): qty 4

## 2014-01-23 MED ORDER — LIDOCAINE HCL (CARDIAC) 20 MG/ML IV SOLN
INTRAVENOUS | Status: AC
  Filled 2014-01-23: qty 5

## 2014-01-23 MED ORDER — PROMETHAZINE HCL 25 MG/ML IJ SOLN
INTRAMUSCULAR | Status: AC
  Filled 2014-01-23: qty 1

## 2014-01-23 MED ORDER — BISACODYL 5 MG PO TBEC
5.0000 mg | DELAYED_RELEASE_TABLET | Freq: Every day | ORAL | Status: DC | PRN
  Administered 2014-01-26: 5 mg via ORAL
  Filled 2014-01-23 (×2): qty 1

## 2014-01-23 MED ORDER — SUCCINYLCHOLINE CHLORIDE 20 MG/ML IJ SOLN
INTRAMUSCULAR | Status: AC
  Filled 2014-01-23: qty 1

## 2014-01-23 SURGICAL SUPPLY — 127 items
APL SKNCLS STERI-STRIP NONHPOA (GAUZE/BANDAGES/DRESSINGS)
BANDAGE GAUZE 4  KLING STR (GAUZE/BANDAGES/DRESSINGS) ×1 IMPLANT
BANDAGE GAUZE ELAST BULKY 4 IN (GAUZE/BANDAGES/DRESSINGS) ×2 IMPLANT
BENZOIN TINCTURE PRP APPL 2/3 (GAUZE/BANDAGES/DRESSINGS) IMPLANT
BLADE 10 SAFETY STRL DISP (BLADE) ×2 IMPLANT
BLADE SAW GIGLI 16 STRL (MISCELLANEOUS) IMPLANT
BLADE SURG 11 STRL SS (BLADE) ×1 IMPLANT
BLADE SURG 15 STRL LF DISP TIS (BLADE) IMPLANT
BLADE SURG 15 STRL SS (BLADE) ×2
BLADE SURG ROTATE 9660 (MISCELLANEOUS) ×1 IMPLANT
BLADE ULTRA TIP 2M (BLADE) ×2 IMPLANT
BNDG GAUZE ELAST 4 BULKY (GAUZE/BANDAGES/DRESSINGS) IMPLANT
BRUSH SCRUB EZ 1% IODOPHOR (MISCELLANEOUS) ×1 IMPLANT
BUR ACORN 6.0 PRECISION (BURR) ×2 IMPLANT
BUR ADDG 1.1 (BURR) IMPLANT
BUR MATCHSTICK NEURO 3.0 LAGG (BURR) IMPLANT
BUR ROUTER D-58 CRANI (BURR) ×1 IMPLANT
CANISTER SUCT 3000ML (MISCELLANEOUS) ×2 IMPLANT
CATH VENTRIC 35X38 W/TROCAR LG (CATHETERS) IMPLANT
CLAW UNIV SL SPETZLER MIC (INSTRUMENTS) IMPLANT
CLIP TI MEDIUM 6 (CLIP) IMPLANT
CONT SPEC 4OZ CLIKSEAL STRL BL (MISCELLANEOUS) ×5 IMPLANT
CORDS BIPOLAR (ELECTRODE) ×2 IMPLANT
COVER MAYO STAND STRL (DRAPES) IMPLANT
DECANTER SPIKE VIAL GLASS SM (MISCELLANEOUS) ×2 IMPLANT
DRAIN SNY WOU 7FLT (WOUND CARE) IMPLANT
DRAIN SUBARACHNOID (WOUND CARE) IMPLANT
DRAPE CAMERA VIDEO/LASER (DRAPES) IMPLANT
DRAPE LONG LASER MIC (DRAPES) IMPLANT
DRAPE MICROSCOPE LEICA (MISCELLANEOUS) IMPLANT
DRAPE NEUROLOGICAL W/INCISE (DRAPES) IMPLANT
DRAPE ORTHO SPLIT 77X108 STRL (DRAPES) ×2
DRAPE STERI IOBAN 125X83 (DRAPES) IMPLANT
DRAPE SURG 17X23 STRL (DRAPES) ×1 IMPLANT
DRAPE SURG IRRIG POUCH 19X23 (DRAPES) ×1 IMPLANT
DRAPE SURG ORHT 6 SPLT 77X108 (DRAPES) IMPLANT
DRAPE WARM FLUID 44X44 (DRAPE) ×2 IMPLANT
DRSG TELFA 3X8 NADH (GAUZE/BANDAGES/DRESSINGS) ×2 IMPLANT
DURAPREP 6ML APPLICATOR 50/CS (WOUND CARE) ×2 IMPLANT
ELECT CAUTERY BLADE 6.4 (BLADE) ×2 IMPLANT
ELECT REM PT RETURN 9FT ADLT (ELECTROSURGICAL) ×2
ELECTRODE REM PT RTRN 9FT ADLT (ELECTROSURGICAL) ×1 IMPLANT
EVACUATOR 1/8 PVC DRAIN (DRAIN) IMPLANT
EVACUATOR SILICONE 100CC (DRAIN) IMPLANT
GAUZE SPONGE 4X4 16PLY XRAY LF (GAUZE/BANDAGES/DRESSINGS) IMPLANT
GLOVE BIO SURGEON STRL SZ 6.5 (GLOVE) IMPLANT
GLOVE BIO SURGEON STRL SZ7 (GLOVE) IMPLANT
GLOVE BIO SURGEON STRL SZ7.5 (GLOVE) IMPLANT
GLOVE BIO SURGEON STRL SZ8 (GLOVE) IMPLANT
GLOVE BIO SURGEON STRL SZ8.5 (GLOVE) IMPLANT
GLOVE BIOGEL M 8.0 STRL (GLOVE) IMPLANT
GLOVE BIOGEL PI IND STRL 7.0 (GLOVE) IMPLANT
GLOVE BIOGEL PI IND STRL 7.5 (GLOVE) IMPLANT
GLOVE BIOGEL PI INDICATOR 7.0 (GLOVE) ×2
GLOVE BIOGEL PI INDICATOR 7.5 (GLOVE) ×1
GLOVE ECLIPSE 6.5 STRL STRAW (GLOVE) ×1 IMPLANT
GLOVE ECLIPSE 7.0 STRL STRAW (GLOVE) IMPLANT
GLOVE ECLIPSE 7.5 STRL STRAW (GLOVE) IMPLANT
GLOVE ECLIPSE 8.0 STRL XLNG CF (GLOVE) IMPLANT
GLOVE ECLIPSE 8.5 STRL (GLOVE) IMPLANT
GLOVE EXAM NITRILE LRG STRL (GLOVE) IMPLANT
GLOVE EXAM NITRILE MD LF STRL (GLOVE) ×2 IMPLANT
GLOVE EXAM NITRILE XL STR (GLOVE) IMPLANT
GLOVE EXAM NITRILE XS STR PU (GLOVE) IMPLANT
GLOVE INDICATOR 6.5 STRL GRN (GLOVE) IMPLANT
GLOVE INDICATOR 7.0 STRL GRN (GLOVE) IMPLANT
GLOVE INDICATOR 7.5 STRL GRN (GLOVE) IMPLANT
GLOVE INDICATOR 8.0 STRL GRN (GLOVE) IMPLANT
GLOVE INDICATOR 8.5 STRL (GLOVE) IMPLANT
GLOVE OPTIFIT SS 8.0 STRL (GLOVE) IMPLANT
GLOVE SURG SS PI 6.5 STRL IVOR (GLOVE) ×6 IMPLANT
GLOVE SURG SS PI 7.0 STRL IVOR (GLOVE) ×2 IMPLANT
GOWN STRL REUS W/ TWL LRG LVL3 (GOWN DISPOSABLE) ×2 IMPLANT
GOWN STRL REUS W/ TWL XL LVL3 (GOWN DISPOSABLE) IMPLANT
GOWN STRL REUS W/TWL 2XL LVL3 (GOWN DISPOSABLE) IMPLANT
GOWN STRL REUS W/TWL LRG LVL3 (GOWN DISPOSABLE) ×4
GOWN STRL REUS W/TWL XL LVL3 (GOWN DISPOSABLE)
HEMOSTAT SURGICEL 2X14 (HEMOSTASIS) ×1 IMPLANT
KIT BASIN OR (CUSTOM PROCEDURE TRAY) ×2 IMPLANT
KIT DRAIN CSF ACCUDRAIN (MISCELLANEOUS) IMPLANT
KIT ROOM TURNOVER OR (KITS) ×2 IMPLANT
MARKER SKIN DUAL TIP RULER LAB (MISCELLANEOUS) ×1 IMPLANT
NDL HYPO 25X1 1.5 SAFETY (NEEDLE) ×1 IMPLANT
NDL SPNL 18GX3.5 QUINCKE PK (NEEDLE) IMPLANT
NEEDLE HYPO 25X1 1.5 SAFETY (NEEDLE) ×6 IMPLANT
NEEDLE SPNL 18GX3.5 QUINCKE PK (NEEDLE) IMPLANT
NEURO MONITORING STIM (LABOR (TRAVEL & OVERTIME)) ×1 IMPLANT
NS IRRIG 1000ML POUR BTL (IV SOLUTION) ×2 IMPLANT
PACK CRANIOTOMY (CUSTOM PROCEDURE TRAY) ×2 IMPLANT
PAD DRESSING TELFA 3X8 NADH (GAUZE/BANDAGES/DRESSINGS) ×1 IMPLANT
PAD EYE OVAL STERILE LF (GAUZE/BANDAGES/DRESSINGS) IMPLANT
PATTIES SURGICAL .25X.25 (GAUZE/BANDAGES/DRESSINGS) IMPLANT
PATTIES SURGICAL .5 X.5 (GAUZE/BANDAGES/DRESSINGS) IMPLANT
PATTIES SURGICAL .5 X3 (DISPOSABLE) IMPLANT
PATTIES SURGICAL 1/4 X 3 (GAUZE/BANDAGES/DRESSINGS) IMPLANT
PATTIES SURGICAL 1X1 (DISPOSABLE) IMPLANT
PLATE 1.5  2HOLE MED NEURO (Plate) ×3 IMPLANT
PLATE 1.5 2HOLE MED NEURO (Plate) IMPLANT
PLATE 1.5 5HOLE SQUARE (Plate) ×1 IMPLANT
PLATE 1.5/0.5 13MM BURR HOLE (Plate) ×2 IMPLANT
RUBBERBAND STERILE (MISCELLANEOUS) IMPLANT
SCREW SELF DRILL HT 1.5/4MM (Screw) ×18 IMPLANT
SET TUBING W/EXT DISP (INSTRUMENTS) ×1 IMPLANT
SPECIMEN JAR SMALL (MISCELLANEOUS) IMPLANT
SPONGE GAUZE 4X4 12PLY (GAUZE/BANDAGES/DRESSINGS) ×2 IMPLANT
SPONGE NEURO XRAY DETECT 1X3 (DISPOSABLE) IMPLANT
SPONGE SURGIFOAM ABS GEL 100 (HEMOSTASIS) ×2 IMPLANT
STAPLER VISISTAT 35W (STAPLE) ×3 IMPLANT
SUT ETHILON 3 0 FSL (SUTURE) IMPLANT
SUT ETHILON 3 0 PS 1 (SUTURE) IMPLANT
SUT NURALON 4 0 TR CR/8 (SUTURE) ×6 IMPLANT
SUT PL GUT 3 0 FS 1 (SUTURE) IMPLANT
SUT SILK 0 TIES 10X30 (SUTURE) IMPLANT
SUT STEEL 0 (SUTURE)
SUT STEEL 0 18XMFL TIE 17 (SUTURE) IMPLANT
SUT VIC AB 2-0 CT2 18 VCP726D (SUTURE) ×5 IMPLANT
SYR 20ML ECCENTRIC (SYRINGE) ×2 IMPLANT
SYR CONTROL 10ML LL (SYRINGE) ×4 IMPLANT
TIP SONASTAR STD MISONIX 1.9 (TRAY / TRAY PROCEDURE) IMPLANT
TIP STRAIGHT 25KHZ (INSTRUMENTS) ×1 IMPLANT
TOWEL OR 17X24 6PK STRL BLUE (TOWEL DISPOSABLE) ×2 IMPLANT
TOWEL OR 17X26 10 PK STRL BLUE (TOWEL DISPOSABLE) ×2 IMPLANT
TRAY FOLEY CATH 14FRSI W/METER (CATHETERS) ×2 IMPLANT
TRAY FOLEY METER SIL LF 16FR (CATHETERS) ×1 IMPLANT
TUBE CONNECTING 12X1/4 (SUCTIONS) ×2 IMPLANT
UNDERPAD 30X30 INCONTINENT (UNDERPADS AND DIAPERS) ×2 IMPLANT
WATER STERILE IRR 1000ML POUR (IV SOLUTION) ×2 IMPLANT

## 2014-01-23 NOTE — Progress Notes (Signed)
BP 127/79  Pulse 72  Temp(Src) 97.8 F (36.6 C) (Oral)  Resp 15  Ht 5\' 9"  (1.753 m)  Wt 67.7 kg (149 lb 4 oz)  BMI 22.03 kg/m2  SpO2 100%  LMP 09/20/2012 Alert and oriented x 4, speech clear and fluent Perrl, full eom Left facial weakness, expected given mapping in the OR. Victoria Bates made it clear that the facial weakness was a secondary concern. Wound dressing is clean and dry No drift, moving all extremities well. Full strength on the left.

## 2014-01-23 NOTE — H&P (Signed)
BP 153/79  Pulse 86  Temp(Src) 97.9 F (36.6 C) (Oral)  Resp 17  Ht 5\' 9"  (1.753 m)  Wt 64.411 kg (142 lb)  BMI 20.96 kg/m2  SpO2 100%  LMP 09/20/2012 Victoria Bates is a 33 y.o. female Whom will be taken to the OR for tumor resection. She underwent a Head CT this spring which showed low signal in the right frontal lobe. The CT was performed secondary to a probable seizure. 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 She has opted for resection, and chemotherapy . Allergies  Allergen Reactions  . Adhesive [Tape] Other (See Comments)    Skin tear  . Latex Hives  . Percocet [Oxycodone-Acetaminophen] Other (See Comments)    makes pt angry  . Shellfish Allergy Diarrhea and Nausea And Vomiting  . Soma [Carisoprodol]     Watson brand, specifically, causes disorientation (can take other brands)  . Vancomycin Hives   Past Medical History  Diagnosis Date  . Liver hemangioma   . Anemia   . Headache(784.0)     migraines  . Anxiety   . Asthma     very mild- no inhaler use since July 2013  . Complication of anesthesia     takes longer for patient to go under anesthesia  . Brain tumor     Hx: of  . Infection     to right head incision  . Petit mal seizure status   . Dizziness   . Seizures     petit mal serizures most days  . Cancer 2003    skin   Past Surgical History  Procedure Laterality Date  . Cesarean section    . Coposcopy  08/06/2012    cervical biopsy  . Left knee surgery  2000    torn meniscus  . Right flank surgery  2005    bnign mass from flank area-right  . Esophagogastroduodenoscopy (egd) with propofol N/A 08/29/2012    Procedure: ESOPHAGOGASTRODUODENOSCOPY (EGD) WITH PROPOFOL;  Surgeon: Arta Silence, MD;  Location: [facility];  Service: Endoscopy;  Laterality: N/A;  . Colonoscopy with propofol N/A 08/29/2012    Procedure: COLONOSCOPY WITH PROPOFOL;  Surgeon: Arta Silence, MD;  Location: [facility];  Service:  Endoscopy;  Laterality: N/A;  . Wisdom tooth extraction    . Robotic assisted total hysterectomy with bilateral salpingo oopherectomy Bilateral 09/25/2012    Procedure: ROBOTIC ASSISTED TOTAL HYSTERECTOMY WITH BILATERAL SALPINGO OOPHORECTOMY;  Surgeon: Maeola Sarah. Landry Mellow, MD;  Location: [facility];  Service: Gynecology;  Laterality: Bilateral;  . Abdominal hysterectomy    . Craniotomy Right 09/26/2013    Procedure: Awake Right frontal open brain biopsy;  Surgeon: Winfield Cunas, MD;  Location: [facility];  Service: Neurosurgery;  Laterality: Right;  awake Right frontal open brain biopsy  . Craniotomy N/A 10/30/2013    Procedure: debridement of cranial wound  right temporal incision, ;  Surgeon: Winfield Cunas, MD;  Location: [facility];  Service: Neurosurgery;  Laterality: N/A;   Prior to Admission medications   Medication Sig Start Date End Date Taking? Authorizing Provider  acetaminophen (TYLENOL) 500 MG tablet Take 500-1,000 mg by mouth 3 (three) times daily as needed (pain).    Yes Historical Provider, MD  ALPRAZolam [name] 1 MG tablet Take 1 mg by mouth 2 (two) times daily as needed (seizures).    Yes Historical Provider, MD  aspirin 325 MG tablet Take 325 mg by mouth at bedtime as needed (  pain/inflammation).    Yes Historical Provider, MD  carisoprodol (SOMA) 350 MG tablet Take 350 mg by mouth 4 (four) times daily. For seizure prevention   Yes Historical Provider, MD  diclofenac (VOLTAREN) 75 MG EC tablet Take 75 mg by mouth See admin instructions. Take 1 tablet (75 mg) every morning, may take an additional tablet in the evening as needed for inflammation   Yes Historical Provider, MD  levETIRAcetam (KEPPRA) 500 MG tablet Take 500 mg by mouth 3 (three) times daily. For seizure prevention 09/21/13  Yes Winfield Cunas, MD  temazepam (RESTORIL) 30 MG capsule Take 30 mg by mouth at bedtime as needed for sleep.   Yes Historical Provider, MD  traMADol (ULTRAM) 50 MG tablet Take 50 mg by mouth every 6 (six) hours  as needed for moderate pain (pain).    Yes Historical Provider, MD  albuterol (PROVENTIL HFA;VENTOLIN HFA) 108 (90 BASE) MCG/ACT inhaler Inhale 2 puffs into the lungs every 6 (six) hours as needed for wheezing or shortness of breath.     Historical Provider, MD   History   Social History  . Marital Status: Single    Spouse Name: N/A    Number of Children: N/A  . Years of Education: N/A   Occupational History  . Not on file.   Social History Main Topics  . Smoking status: Never Smoker   . Smokeless tobacco: Never Used  . Alcohol Use: No  . Drug Use: No  . Sexual Activity: Yes    Birth Control/ Protection: None   Other Topics Concern  . Not on file   Social History Narrative  . No narrative on file   Family History  Problem Relation Age of Onset  . Hypertension Mother    Physical Exam  Constitutional: She is oriented to person, place, and time. She appears well-developed and well-nourished. No distress.  HENT:  Right Ear: External ear normal.  Left Ear: External ear normal.  Eyes: Conjunctivae and EOM are normal. Pupils are equal, round, and reactive to light.  Neck: Normal range of motion. Neck supple.  Cardiovascular: Normal rate, regular rhythm and normal heart sounds.   Pulmonary/Chest: Effort normal and breath sounds normal.  Abdominal: Soft. Bowel sounds are normal.  Musculoskeletal: Normal range of motion.  Neurological: She is alert and oriented to person, place, and time. She has normal reflexes.  Skin: Skin is warm and dry.  Psychiatric: She has a normal mood and affect. Her behavior is normal. Judgment and thought content normal.   OR for tumor resection.

## 2014-01-23 NOTE — Op Note (Signed)
01/23/2014  3:54 PM  PATIENT:  Victoria Bates  33 y.o. female who presents for resection of  known low grade astrocytoma.  PRE-OPERATIVE DIAGNOSIS:  brain tumor, right frontal  POST-OPERATIVE DIAGNOSIS:  brain tumor, right frontal  PROCEDURE:  Procedure(s):Right Frontal Awake Craniotomy with BrainLab for tumor resection Intraoperative cerebral mapping  SURGEON:  Surgeon(s): Winfield Cunas, MD Eustace Moore, MD  ASSISTANTS:Jones, Shanon Brow  ANESTHESIA:   local and IV sedation  EBL:  Total I/O In: 3000 [I.V.:3000] Out: 525 [Urine:425; Blood:100]  BLOOD ADMINISTERED:none  CELL SAVER GIVEN:none  COUNT:per nursing  DRAINS: none   SPECIMEN:  Source of Specimen:  right frontal lobe  DICTATION: Mrs. Maiorana was brought to the operating room and placed in a three pin [place] head holder using iv sedation and local anesthetic. I prepared the pin sites with Duraprep then used lidocaine mixed with sodium bicarbonate to inject the pin sites.  She was localized to the Brainlab system, and we achieved good registration. We were able to used the preop planning to properly place the incision and craniotomy. Her head was then prepped and draped in a sterile manner. I injected with local anesthetic the planned incision, and once Mrs. Ahart said the scalp was numb, I opened. I used a 10 blade to open the scalp. I developed the scalp flap, then divided the temporalis with monopolar cautery. I reflected the temporalis and the scalp rostrally. I used the drill and made 4 burr holes again using the stereotactic guidance to check their location, I used the craniotome to connect the burr holes and remove the bone flap. I placed tack up sutures around the craniotomy opening. I opened the dura with a 15 blade and based the flap rostrally. I then exposed the brain surface, noted my biopsy site and started the mapping. The tumor was well within the confines of the craniotomy, as seen with the stereotactic  guidance. I then mapped the cortex. The two rostral most affected gyri were not involved with the motor area. The brightest signal on flair was contained in a gyrus that did provoke eye twitching when stimulated. I therefore started my resection using the cusa on the rostral gyri. I was able to resect the tumor to the pial banks of the uninvolved cerebrum. I then using Mrs. Kurtz as her own guide, started my resection of the caudal gyrus. We did not suffer any motor loss and I completed the resection of the involved gyrus seemingly without incident. I used the stereotactic guidance continually to check my margins and tumor resection. With Dr. Ronnald Ramp assistance we assessed the resection cavity, and felt that the abnormality on flair sequences had been resected. The tumor was harder and tougher than normal brain, though not necessarily vascular. I irrigated the wound, achieved good hemostasis, then closed the wound. I approximated the dura, bone flap, temporalis muscle flap, and scalp to close the wound. I used plates and screws to secure the bone. The scalp was approximated with vicryl sutures as was the temporalis muscle. I used staples to approximate the wound edges. I applied a sterile dressing. She maintained a normal exam throughout the case.   PLAN OF CARE: Admit to inpatient   PATIENT DISPOSITION:  PACU - hemodynamically stable.   Delay start of Pharmacological VTE agent (>24hrs) due to surgical blood loss or risk of bleeding:  yes

## 2014-01-23 NOTE — Anesthesia Preprocedure Evaluation (Addendum)
Anesthesia Evaluation  Patient identified by MRN, date of birth, ID band Patient awake  General Assessment Comment:Case discussed in detail with Dr. Christella Noa. CE  Reviewed: Allergy & Precautions, H&P , NPO status , Patient's Chart, lab work & pertinent test results, reviewed documented beta blocker date and time   History of Anesthesia Complications (+) history of anesthetic complications  Airway Mallampati: II TM Distance: >3 FB     Dental  (+) Teeth Intact, Dental Advisory Given   Pulmonary asthma ,  breath sounds clear to auscultation        Cardiovascular negative cardio ROS  Rhythm:Regular Rate:Normal     Neuro/Psych  Headaches, Seizures -, Poorly Controlled,  PSYCHIATRIC DISORDERS Anxiety    GI/Hepatic negative GI ROS, Neg liver ROS,   Endo/Other    Renal/GU negative Renal ROS     Musculoskeletal   Abdominal   Peds  Hematology  (+) anemia ,   Anesthesia Other Findings   Reproductive/Obstetrics                         Anesthesia Physical Anesthesia Plan  ASA: III  Anesthesia Plan: MAC   Post-op Pain Management:    Induction: Intravenous  Airway Management Planned: Nasal Cannula  Additional Equipment: Arterial line  Intra-op Plan:   Post-operative Plan:   Informed Consent: I have reviewed the patients History and Physical, chart, labs and discussed the procedure including the risks, benefits and alternatives for the proposed anesthesia with the patient or authorized representative who has indicated his/her understanding and acceptance.   Dental advisory given  Plan Discussed with: CRNA, Anesthesiologist and Surgeon  Anesthesia Plan Comments:        Anesthesia Quick Evaluation

## 2014-01-23 NOTE — Transfer of Care (Signed)
Immediate Anesthesia Transfer of Care Note  Patient: Victoria Bates  Procedure(s) Performed: Procedure(s) with comments: Awake Craniotomy with BrainLab (N/A) - Awake Craniotomy  tumor with BrainLab  Patient Location: PACU  Anesthesia Type:MAC  Level of Consciousness: awake, alert  and oriented  Airway & Oxygen Therapy: Patient Spontanous Breathing and Patient connected to nasal cannula oxygen  Post-op Assessment: Report given to PACU RN, Post -op Vital signs reviewed and stable, Patient moving all extremities X 4 and Patient able to stick tongue midline  Post vital signs: Reviewed and stable  Complications: No apparent anesthesia complications

## 2014-01-23 NOTE — Anesthesia Postprocedure Evaluation (Signed)
  Anesthesia Post-op Note  Patient: Victoria Bates  Procedure(s) Performed: Procedure(s) with comments: Awake Craniotomy with BrainLab (N/A) - Awake Craniotomy  tumor with BrainLab  Patient Location: PACU  Anesthesia Type:MAC  Level of Consciousness: awake  Airway and Oxygen Therapy: Patient Spontanous Breathing  Post-op Pain: mild  Post-op Assessment: Post-op Vital signs reviewed  Post-op Vital Signs: Reviewed  Last Vitals:  Filed Vitals:   01/23/14 1622  BP:   Pulse:   Temp: 36.8 C  Resp:     Complications: No apparent anesthesia complications

## 2014-01-24 ENCOUNTER — Encounter (HOSPITAL_COMMUNITY): Payer: Self-pay | Admitting: Neurosurgery

## 2014-01-24 ENCOUNTER — Inpatient Hospital Stay (HOSPITAL_COMMUNITY): Payer: Medicaid Other

## 2014-01-24 MED ORDER — DEXAMETHASONE 4 MG PO TABS
4.0000 mg | ORAL_TABLET | Freq: Three times a day (TID) | ORAL | Status: DC
  Administered 2014-01-24 – 2014-01-27 (×10): 4 mg via ORAL
  Filled 2014-01-24 (×9): qty 1

## 2014-01-24 MED ORDER — HYDROCODONE-ACETAMINOPHEN 5-325 MG PO TABS
1.0000 | ORAL_TABLET | ORAL | Status: DC | PRN
  Administered 2014-01-25 (×2): 2 via ORAL
  Administered 2014-01-25: 1 via ORAL
  Administered 2014-01-27: 2 via ORAL
  Filled 2014-01-24: qty 1
  Filled 2014-01-24 (×3): qty 2
  Filled 2014-01-24: qty 1

## 2014-01-24 NOTE — Progress Notes (Signed)
Patient ID: Victoria Bates, female   DOB: June 30, 1981, 33 y.o.   MRN: [id number] BP 115/63  Pulse 104  Temp(Src) 98.5 F (36.9 C) (Oral)  Resp 18  Ht 5\' 9"  (1.753 m)  Wt 67.7 kg (149 lb 4 oz)  BMI 22.03 kg/m2  SpO2 99%  LMP 09/20/2012 Alert and oriented x 4, left facial weakness Complaining of some difficulty swallowing Moving all extremities well MRI reviewed, multiples areas on flair with increased signal. Not clear if this is tumor, reaction to surgery. Will repeat film in ~6 weeks. Doing well otherwise.

## 2014-01-24 NOTE — Progress Notes (Signed)
Patient transferred from [facility], patient alert and oriented x4, c/o pain rating 9, was medicated per order. Will continue to monitor

## 2014-01-25 NOTE — Progress Notes (Signed)
Patient ID: Victoria Bates, female   DOB: 07/07/1981, 33 y.o.   MRN: [id number] Patient seems to be doing well condition of headache still with left facial weakness  Up her lower extremity strength is 5 out of 5  Continue to mobilize with physical therapy

## 2014-01-26 MED ORDER — ALUM & MAG HYDROXIDE-SIMETH 200-200-20 MG/5ML PO SUSP
30.0000 mL | ORAL | Status: DC | PRN
  Administered 2014-01-26: 30 mL via ORAL
  Filled 2014-01-26: qty 30

## 2014-01-26 MED ORDER — PANTOPRAZOLE SODIUM 40 MG IV SOLR
40.0000 mg | Freq: Two times a day (BID) | INTRAVENOUS | Status: DC
  Administered 2014-01-26 – 2014-01-27 (×2): 40 mg via INTRAVENOUS
  Filled 2014-01-26 (×2): qty 40

## 2014-01-26 NOTE — Progress Notes (Signed)
Filed Vitals:   01/25/14 1805 01/25/14 2158 01/26/14 0245 01/26/14 0542  BP: 120/67 124/77 121/64 125/72  Pulse: 79 89 85 84  Temp: 98.5 F (36.9 C) 97.7 F (36.5 C) 98.7 F (37.1 C) 99.1 F (37.3 C)  TempSrc: Oral Oral Oral Oral  Resp: 20 18 16 18   Height:      Weight:      SpO2: 100% 100% 100% 99%    Patient resting in bed, has been out of bed to the bathroom, but has mild vertigo when moving about. Awake, alert, oriented. Mild left facial weakness, moving all 4 extremities well. No drift of the upper extremities. Dressing clean and dry.  Incisional/headache management difficult because she has been vomiting when given Norco, and therefore she is receiving morphine IV. She explains that she has difficulty with Percocet as well. Her preference is to try tramadol, using the morphine IV as a rescue.  Plan: Encouraged to ambulate with a staff in the halls, to build up balance and strength.  Hosie Spangle, MD 01/26/2014, 9:53 AM

## 2014-01-27 ENCOUNTER — Encounter (HOSPITAL_COMMUNITY): Payer: Self-pay | Admitting: Neurosurgery

## 2014-01-27 LAB — BASIC METABOLIC PANEL
Anion gap: 13 (ref 5–15)
BUN: 8 mg/dL (ref 6–23)
CO2: 26 mEq/L (ref 19–32)
Calcium: 9 mg/dL (ref 8.4–10.5)
Chloride: 99 mEq/L (ref 96–112)
Creatinine, Ser: 0.48 mg/dL — ABNORMAL LOW (ref 0.50–1.10)
GFR calc Af Amer: >90 mL/min (ref >90)
GFR calc non Af Amer: >90 mL/min (ref >90)
Glucose, Bld: 115 mg/dL — ABNORMAL HIGH (ref 70–99)
Potassium: 4.4 mEq/L (ref 3.7–5.3)
Sodium: 138 mEq/L (ref 137–147)

## 2014-01-27 LAB — CBC
HCT: 32 % — ABNORMAL LOW (ref 36.0–46.0)
Hemoglobin: 10.6 g/dL — ABNORMAL LOW (ref 12.0–15.0)
MCH: 31.2 pg (ref 26.0–34.0)
MCHC: 33.1 g/dL (ref 30.0–36.0)
MCV: 94.1 fL (ref 78.0–100.0)
Platelets: 222 10*3/uL (ref 150–400)
RBC: 3.4 MIL/uL — ABNORMAL LOW (ref 3.87–5.11)
RDW: 11.9 % (ref 11.5–15.5)
WBC: 7.5 10*3/uL (ref 4.0–10.5)

## 2014-01-27 MED ORDER — HYDROCODONE-ACETAMINOPHEN 5-325 MG PO TABS: 1.0000 | ORAL_TABLET | Freq: Four times a day (QID) | ORAL | Status: DC | PRN

## 2014-01-27 MED ORDER — CARISOPRODOL 350 MG PO TABS: 350.0000 mg | ORAL_TABLET | Freq: Four times a day (QID) | ORAL | Status: DC

## 2014-01-27 MED ORDER — PANTOPRAZOLE SODIUM 40 MG PO TBEC: 40.0000 mg | DELAYED_RELEASE_TABLET | Freq: Two times a day (BID) | ORAL | Status: DC

## 2014-01-27 MED ORDER — DEXAMETHASONE 4 MG PO TABS: 4.0000 mg | ORAL_TABLET | ORAL | Status: DC

## 2014-01-27 NOTE — Discharge Instructions (Signed)
Craniotomy °Care After °Please read the instructions outlined below and refer to this sheet in the next few weeks. These discharge instructions provide you with general information on caring for yourself after you leave the hospital. Your surgeon may also give you specific instructions. While your treatment has been planned according to the most current medical practices available, unavoidable complications occasionally occur. If you have any problems or questions after discharge, please call your surgeon. °Although there are many types of brain surgery, recovery following craniotomy (surgical opening of the skull) is much the same for each. However, recovery depends on many factors. These include the type and severity of brain injury and the type of surgery. It also depends on any nervous system function problems (neurological deficits) before surgery. If the craniotomy was done for cancer, chemotherapy and radiation could follow. You could be in the hospital from 5 days to a couple weeks. This depends on the type of surgery, findings, and whether there are complications. °HOME CARE INSTRUCTIONS  °· It is not unusual to hear a clicking noise after a craniotomy, the plates and screws used to attach the bone flap can sometimes cause this. It is a normal occurrence if this does happen °· Do not drive for 10 days after the operation °· Your scalp may feel spongy for a while, because of fluid under it. This will gradually get better. Occasionally, the surgeon will not replace the bone that was removed to access the brain. If there is a bony defect, the surgeon will ask you to wear a helmet for protection. This is a discussion you should have with your surgeon prior to leaving the hospital (discharge). °· Numbness may persist in some areas of your scalp. °· Take all medications as directed. Sometimes steroids to control swelling are prescribed. Anticonvulsants to prevent seizures may also be given. Do not use alcohol,  other drugs, or medications unless your surgeon says it is OK. °· Keep the wound dry and clean. The wound may be washed gently with soap and water. Then, you may gently blot or dab it dry, without rubbing. Do not take baths, use swimming pools or hot tubs for 10 days, or as instructed by your caregiver. It is best to wait to see you surgeon at your first postoperative visit, and to get directions at that time. °· Only take over-the-counter or prescription medicines for pain, discomfort, or fever as directed by your caregiver. °· You may continue your normal diet, as directed. °· Walking is OK for exercise. Wait at least 3 months before you return to mild, non-contact sports or as your surgeon suggests. Contact sports should be avoided for at least 1 year, unless your surgeon says it is OK. °· If you are prescribed steroids, take them exactly as prescribed. If you start having a decrease in nervous system functions (neurological deficits) and headaches as the dose of steroids is reduced, tell your surgeon right away. °· When the anticonvulsant prescription is finished you no longer need to take it. °SEEK IMMEDIATE MEDICAL CARE IF:  °· You develop nausea, vomiting, severe headaches, confusion, or you have a seizure. °· You develop chest pain, a stiff neck, or difficulty breathing. °· There is redness, swelling, or increasing pain in the wound or pin insertion sites. °· You have an increase in swelling or bruising around the eyes. °· There is drainage or pus coming from the wound. °· You have an oral temperature above 102° F (38.9° C), not controlled by medicine. °·   You notice a foul smell coming from the wound or dressing. °· The wound breaks open (edges not staying together) after the stitches have been removed. °· You develop dizziness or fainting while standing. °· You develop a rash. °· You develop any reaction or side effects to the medications given. °Document Released: 10/04/2005 Document Revised: 09/26/2011  Document Reviewed: 07/13/2009 °Exit[employer]® Patient Information ©2013 [employer]. ° °

## 2014-01-27 NOTE — Discharge Summary (Signed)
Physician Discharge Summary  Patient ID: Victoria Bates MRN: [id number] DOB/AGE: June 06, 1981 33 y.o.  Admit date: 01/23/2014 Discharge date: 01/27/2014  Admission Diagnoses:right frontal low grade astrocytoma  Discharge Diagnoses:  Active Problems:   Brain tumor, astrocytoma   Discharged Condition: good  Hospital Course: [name] was admitted and taken to the operating room for an awake craniotomy with cortical mapping for tumor resection. Post op she has left facial weakness, which was known intraoperatively with mapping. Her post op course has been steady, with some improvement in the left 7th nerve function. Post op scan was confusing due to blood products and post op edema. Will repeat scan in ~ 53months. Will present at tumor board for further recommendations about treatment. At discharge wound is clean, dry, without signs of infection. Moving all extremities well. Tolerating a regular diet, voiding normally.   Consults: None  Significant Diagnostic Studies: none  Treatments: surgery: as above  Discharge Exam: Blood pressure 112/65, pulse 68, temperature 98.8 F (37.1 C), temperature source Oral, resp. rate 18, height 5\' 9"  (1.753 m), weight 67.7 kg (149 lb 4 oz), last menstrual period 09/20/2012, SpO2 100.00%. as Above  Disposition: 01-Home or Self Care     Medication List    STOP taking these medications       traMADol 50 MG tablet  Commonly known as:  ULTRAM      TAKE these medications       acetaminophen 500 MG tablet  Commonly known as:  TYLENOL  Take 500-1,000 mg by mouth 3 (three) times daily as needed (pain).     albuterol 108 (90 BASE) MCG/ACT inhaler  Commonly known as:  PROVENTIL HFA;VENTOLIN HFA  Inhale 2 puffs into the lungs every 6 (six) hours as needed for wheezing or shortness of breath.     ALPRAZolam 1 MG tablet  Commonly known as:  XANAX  Take 1 mg by mouth 2 (two) times daily as needed (seizures).     aspirin 325 MG tablet  Take 325 mg  by mouth at bedtime as needed (pain/inflammation).     carisoprodol 350 MG tablet  Commonly known as:  SOMA  Take 1 tablet (350 mg total) by mouth 4 (four) times daily.     carisoprodol 350 MG tablet  Commonly known as:  SOMA  Take 350 mg by mouth 4 (four) times daily. For seizure prevention     dexamethasone 4 MG tablet  Commonly known as:  DECADRON  Take 1 tablet (4 mg total) by mouth See admin instructions.     diclofenac 75 MG EC tablet  Commonly known as:  VOLTAREN  Take 75 mg by mouth See admin instructions. Take 1 tablet (75 mg) every morning, may take an additional tablet in the evening as needed for inflammation     HYDROcodone-acetaminophen 5-325 MG per tablet  Commonly known as:  NORCO/VICODIN  Take 1 tablet by mouth every 6 (six) hours as needed for moderate pain.     levETIRAcetam 500 MG tablet  Commonly known as:  KEPPRA  Take 500 mg by mouth 3 (three) times daily. For seizure prevention     temazepam 30 MG capsule  Commonly known as:  RESTORIL  Take 30 mg by mouth at bedtime as needed for sleep.           Follow-up Information   Follow up with Jalysa Swopes L, MD In 1 week. (For suture removal)    Specialty:  Neurosurgery   Contact information:   [phone]  [address] [place] [address] [phone]       Signed: Derrin Currey L 01/27/2014, 2:15 PM

## 2014-01-27 NOTE — Progress Notes (Signed)
Patient d/c home, D/C instruction given, patient verbalized understanding. Patient left in stable condition.

## 2014-02-10 ENCOUNTER — Encounter: Payer: Self-pay | Admitting: Radiation Oncology

## 2014-02-10 ENCOUNTER — Ambulatory Visit
Admission: RE | Admit: 2014-02-10 | Discharge: 2014-02-10 | Disposition: A | Discharge status: Home / Self Care | Payer: Medicaid Other | Source: Ambulatory Visit | Attending: Radiation Oncology | Admitting: Radiation Oncology

## 2014-02-10 VITALS — BP 127/81 | HR 84 | Temp 98.3°F | Resp 16 | Ht 69.0 in | Wt 148.9 lb

## 2014-02-10 DIAGNOSIS — D496 Neoplasm of unspecified behavior of brain: Secondary | ICD-10-CM | POA: Diagnosis not present

## 2014-02-10 DIAGNOSIS — Z9079 Acquired absence of other genital organ(s): Secondary | ICD-10-CM | POA: Diagnosis not present

## 2014-02-10 DIAGNOSIS — R51 Headache: Secondary | ICD-10-CM | POA: Insufficient documentation

## 2014-02-10 DIAGNOSIS — F411 Generalized anxiety disorder: Secondary | ICD-10-CM | POA: Insufficient documentation

## 2014-02-10 DIAGNOSIS — C719 Malignant neoplasm of brain, unspecified: Secondary | ICD-10-CM

## 2014-02-10 DIAGNOSIS — Z90710 Acquired absence of both cervix and uterus: Secondary | ICD-10-CM | POA: Diagnosis not present

## 2014-02-10 DIAGNOSIS — Z791 Long term (current) use of non-steroidal anti-inflammatories (NSAID): Secondary | ICD-10-CM | POA: Diagnosis not present

## 2014-02-10 DIAGNOSIS — C711 Malignant neoplasm of frontal lobe: Secondary | ICD-10-CM | POA: Diagnosis not present

## 2014-02-10 DIAGNOSIS — R569 Unspecified convulsions: Secondary | ICD-10-CM | POA: Insufficient documentation

## 2014-02-10 DIAGNOSIS — Z510 Encounter for antineoplastic radiation therapy: Secondary | ICD-10-CM | POA: Insufficient documentation

## 2014-02-10 DIAGNOSIS — J45909 Unspecified asthma, uncomplicated: Secondary | ICD-10-CM | POA: Insufficient documentation

## 2014-02-10 HISTORY — DX: Reserved for concepts with insufficient information to code with codable children: IMO0002

## 2014-02-10 NOTE — Progress Notes (Signed)
Please see the Nurse Progress Note in the MD Initial Consult Encounter for this patient. 

## 2014-02-10 NOTE — Progress Notes (Signed)
See progress note under physician encounter. 

## 2014-02-10 NOTE — Progress Notes (Signed)
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 female who presented in early March 2015 with feeling numb on the left side of her face, feeling as if she is looking through a fog in the left eye. She was not having headaches. She was noted by a coworker to have a left facial droop on the day of admission (09/20/13) and Mrs. Lieser described it a Heaviness in her face. A head ct on 08/23/13 showed a hypodensity in the right frontal lobe     MRI without contrast on 3/7 showed cortical/subcortical T2 hyperintensity in the posterior right frontal lobe with fat gyri and indistinct sulci.  Stealth MRI with contrast on 3/10 re-demonstrated is a ill-defined, roughly 3 cm in diameter area of right inferior frontal cortical and subcortical T2 and FLAIR hyperintensity without associated postcontrast enhancement. Mild architectural distortion with gyral enlargement.   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  Additional molecular studies at [facility] reveal 1p19q codeletion   The patient pursued a second opinion at [place] with Dr. Tommi Rumps where tumor resection was recommended.  She elected to return for resection with Dr. Christella Noa on 01/23/14.  She underwent image-guided tumor resection awake with cortical mapping.  Post-op MRI showed some additional T2 signal change medial and superior to cavity representing post-op change versus possible residual tumor.      She has kindly been referred today to discuss possible adjuvant treatment options which include radiotherapy.  PREVIOUS  RADIATION THERAPY: No  PAST MEDICAL HISTORY:  has a past medical history of Liver hemangioma; Anemia; Headache(784.0); Anxiety; Asthma; Complication of anesthesia; Brain tumor; Infection; Petit mal seizure status; Dizziness; Seizures; Cancer (2003); and Herniated disc.    PAST SURGICAL HISTORY: Past Surgical History  Procedure Laterality Date  . Cesarean section    . Coposcopy  08/06/2012    cervical biopsy  . Left knee surgery  2000    torn meniscus  . Right flank surgery  2005    bnign mass from flank area-right  . Esophagogastroduodenoscopy (egd) with propofol N/A 08/29/2012    Procedure: ESOPHAGOGASTRODUODENOSCOPY (EGD) WITH PROPOFOL;  Surgeon: Arta Silence, MD;  Location: [facility];  Service: Endoscopy;  Laterality: N/A;  . Colonoscopy with propofol N/A 08/29/2012    Procedure: COLONOSCOPY WITH PROPOFOL;  Surgeon: Arta Silence, MD;  Location: [facility];  Service: Endoscopy;  Laterality: N/A;  . Wisdom tooth extraction    . Robotic assisted total hysterectomy with bilateral salpingo oopherectomy Bilateral 09/25/2012    Procedure: ROBOTIC ASSISTED TOTAL HYSTERECTOMY WITH BILATERAL SALPINGO OOPHORECTOMY;  Surgeon: Maeola Sarah. Landry Mellow, MD;  Location: [facility];  Service: Gynecology;  Laterality: Bilateral;  . Abdominal hysterectomy    . Craniotomy Right 09/26/2013    Procedure: Awake Right frontal open brain biopsy;  Surgeon: Winfield Cunas, MD;  Location: [facility];  Service: Neurosurgery;  Laterality: Right;  awake Right frontal open brain biopsy  . Craniotomy N/A 10/30/2013    Procedure: debridement of cranial wound  right temporal incision, ;  Surgeon: Winfield Cunas, MD;  Location: [facility];  Service:  Neurosurgery;  Laterality: N/A;  . Craniotomy N/A 01/23/2014    Procedure: Awake Craniotomy with BrainLab;  Surgeon: Winfield Cunas, MD;  Location: [place];  Service: Neurosurgery;  Laterality: N/A;  Awake Craniotomy  tumor with BrainLab  . Rotator cuff repair      FAMILY HISTORY:  family history includes Hypertension in her mother.  SOCIAL HISTORY:  reports that she has never smoked. She has never used smokeless tobacco. She reports that she does not drink alcohol or use illicit drugs.  ALLERGIES: Adhesive; Latex; Percocet; Shellfish allergy; Soma; and Vancomycin  MEDICATIONS:  Current Outpatient Prescriptions  Medication Sig Dispense Refill  . acetaminophen (TYLENOL) 500 MG tablet Take by mouth.      . ALPRAZolam (XANAX) 1 MG tablet Take 1 mg by mouth 2 (two) times daily as needed (seizures).       . carisoprodol (SOMA) 350 MG tablet Take 1 tablet (350 mg total) by mouth 4 (four) times daily.  80 tablet  0  . diclofenac (VOLTAREN) 75 MG EC tablet Take 75 mg by mouth See admin instructions. Take 1 tablet (75 mg) every morning, may take an additional tablet in the evening as needed for inflammation      . HYDROcodone-acetaminophen (NORCO/VICODIN) 5-325 MG per tablet Take 1 tablet by mouth every 6 (six) hours as needed for moderate pain.  40 tablet  0  . levETIRAcetam (KEPPRA) 500 MG tablet Take 500 mg by mouth 3 (three) times daily. For seizure prevention      . temazepam (RESTORIL) 30 MG capsule Take 30 mg by mouth at bedtime as needed for sleep.      . temazepam (RESTORIL) 30 MG capsule as needed.      . traMADol (ULTRAM) 50 MG tablet every 6 (six) hours as needed.      Marland Kitchen albuterol (PROVENTIL HFA;VENTOLIN HFA) 108 (90 BASE) MCG/ACT inhaler Inhale 2 puffs into the lungs every 6 (six) hours as needed for wheezing or shortness of breath.       Marland Kitchen aspirin 325 MG tablet Take 325 mg by mouth at bedtime as needed (pain/inflammation).       Marland Kitchen dexamethasone (DECADRON) 4 MG tablet Take 1 tablet (4 mg total) by mouth See admin instructions.  6 tablet  0   No current facility-administered medications for this encounter.    REVIEW OF SYSTEMS:  A 15 point review of systems is documented in the electronic medical record. This was obtained by the nursing staff. However, I reviewed  this with the patient to discuss relevant findings and make appropriate changes.  Pertinent items are noted in HPI.   PHYSICAL EXAM:  height is 5\' 9"  (1.753 m) and weight is 148 lb 14.4 oz (67.541 kg). Her oral temperature is 98.3 F (36.8 C). Her blood pressure is 127/81 and her pulse is 84. Her respiration is 16 and oxygen saturation is 100%.   Per neurosurgery prior to recent surgery:  Constitutional: She is oriented to person, place, and time. She appears well-developed and well-nourished. No distress.  HENT:  Right Ear: External ear normal.  Left Ear: External ear normal.  Eyes: Conjunctivae and EOM are normal. Pupils are equal, round, and reactive to light.  Neck: Normal range of motion. Neck supple.  Cardiovascular: Normal rate, regular rhythm and normal heart sounds.  Pulmonary/Chest: Effort normal and breath sounds normal.  Abdominal: Soft. Bowel sounds are normal.  Musculoskeletal: Normal range of motion.  Neurological: She is alert and oriented to person, place,  and time. She has normal reflexes.  Skin: Skin is warm and dry.  Psychiatric: She has a normal mood and affect. Her behavior is normal. Judgment and thought content normal.  Today, post-op her craniotomy incision is healing well with no evidence of infection.   KPS = 90  100 - Normal; no complaints; no evidence of disease. 90   - Able to carry on normal activity; minor signs or symptoms of disease. 80   - Normal activity with effort; some signs or symptoms of disease. 60   - Cares for self; unable to carry on normal activity or to do active work. 60   - Requires occasional assistance, but is able to care for most of his personal needs. 50   - Requires considerable assistance and frequent medical care. 14   - Disabled; requires special care and assistance. 54   - Severely disabled; hospital admission is indicated although death not imminent. 56   - Very sick; hospital admission necessary; active supportive treatment  necessary. 10   - Moribund; fatal processes progressing rapidly. 0     - Dead  Karnofsky DA, Abelmann [place], Craver LS and Burchenal [facility] [phone]) The use of the nitrogen mustards in the palliative treatment of carcinoma: with particular reference to bronchogenic carcinoma Cancer 1 634-56  LABORATORY DATA:  Lab Results  Component Value Date   WBC 7.5 01/27/2014   HGB 10.6* 01/27/2014   HCT 32.0* 01/27/2014   MCV 94.1 01/27/2014   PLT 222 01/27/2014   Lab Results  Component Value Date   NA 138 01/27/2014   K 4.4 01/27/2014   CL 99 01/27/2014   CO2 26 01/27/2014   Lab Results  Component Value Date   ALT 8 09/20/2013   AST 12 09/20/2013   ALKPHOS 40 09/20/2013   BILITOT 0.7 09/20/2013     RADIOGRAPHY: Ct Head Wo Contrast  01/16/2014   CLINICAL DATA:  Right frontal low-grade oligodendroglioma (WHO grade II).  EXAM: CT HEAD WITHOUT CONTRAST  TECHNIQUE: Contiguous axial images were obtained from the base of the skull through the vertex without intravenous contrast.  COMPARISON:  Brain MRI 12/26/2013  FINDINGS: Sequelae of right frontal craniotomy and biopsy of right frontal tumor are again identified. Subtle cortical and subcortical hypoattenuation in the right frontal lobe is noted at the site of known tumor, better demonstrated on prior MRI. There is no definite evidence of acute cortical infarct, intracranial hemorrhage, midline shift, or extra-axial fluid collection. Ventricles and sulci are within normal limits.  Orbits are unremarkable. Mastoid air cells and visualized paranasal sinuses are clear.  IMPRESSION: 1. Sequelae of prior biopsy of right frontal tumor. 2. No acute intracranial abnormality identified.   Electronically Signed   By: Logan Bores   On: 01/16/2014 11:02   Mr Brain Wo Contrast  01/24/2014   CLINICAL DATA:  Tumor resection.  EXAM: MRI HEAD WITHOUT CONTRAST  TECHNIQUE: Multiplanar, multiecho pulse sequences of the brain and surrounding structures were obtained without intravenous contrast.   COMPARISON:  CT head without contrast 01/16/2014. MRI brain 12/26/2013  FINDINGS: The patient is status post right frontal craniotomy for resection of tumor. A fluid level is present within the surgical cavity with layering blood products. T2 and FLAIR changes deep to the resection cavity extend to the lateral margin of the right lateral ventricle. These are deeper then on the preoperative scan and likely related to periorbital edema rather than tumor.  There is significant restricted diffusion along the medial and superior  aspect of the resection cavity.  A focus of susceptibility is noted posterior to the right parietal lobe likely representing a small on a blood within the subarachnoid space. This was not present on the prior exams. No other significant hemorrhage is evident outside of the resection cavity. Midline structures are within normal limits. There is no significant midline shift. The remainder of the brain remains normal.  Flow is present in the major intracranial arteries. The globes and orbits are intact. The paranasal sinuses and mastoid air cells are clear.  IMPRESSION: 1. Status post resection of right frontal parietal tumor with layering blood products in the surgical cavity. 2. Abnormal T2 and FLAIR signal superior and medial to lesion. The area immediately superior to the resection cavity corresponds with preoperative T2 signal and may in part represent residual tumor. These areas of signal abnormality demonstrate restricted diffusion. The alternative explanation for the signal changes is acute infarct. Evolution of T2 changes in the area superior and medial to the resection cavity we will confirm these suspicions on follow-up exam 3. Focus of blood products posteriorly in the right parietal lobe is likely related to the resection with layering blood into the subarachnoid space posteriorly.   Electronically Signed   By: Lawrence Santiago M.D.   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  At this point, the patient may benefit from adjuvant treatment with possible radiotherapy.    If the tumor is felt to be maximally resected, the NCCN guidelines displayed in the figure below would recommend observation, radiotherapy, or chemotherapy.  The chemo option may be especially effective for patients with the 1p19q codeletion, like this patient.  If the tumor is felt be subtotally resected, the NCCN guidelines displayed in the figure below would recommend radiotherapy with adjuvant PCV as category 1.  Category 2B options would include radiotherapy with or without concurrent temodar followed by adjuvant temodar.  PLAN:  Today, I talked to the patient and family about the findings and work-up thus far.  We discussed the natural history of low grede oligodendroglioma and general treatment, highlighting the role or radiotherapy in the management.  We discussed the available radiation techniques, and focused on the details of logistics and delivery.  We reviewed the anticipated acute and late sequelae associated with radiation in this setting.  The patient was encouraged to ask questions that I answered to the best of my ability.  I filled out a patient counseling form during our discussion including treatment diagrams.  We retained a copy for our records.  The patient would like to proceed with medical oncology consultation later this week.  Given the extent of resection, it may be reasonable to use the maximal safe resection algorithm in the NCCN clinical pathway.  Accordingly, initial chemotherapy alone may be reasonable, reserving radiotherapy for salvage if needed..  I spent 60 minutes minutes face to face with the patient and more than 50% of that time was spent in counseling and/or coordination of care.     ------------------------------------------------  Sheral Apley. Tammi Klippel, M.D.

## 2014-02-10 NOTE — Progress Notes (Addendum)
Patient reports that she took her last decadron tablet this morning. Reports taking tramadol for headaches. Reports despite taking keppra qid and soma qid she continues to have seizures. She reports she has seizures daily with slurred speech, elevated heart rate and blood pressure. Reports she had two bad seizures on Thursday and one Saturday. Patient plans to go to Dr. Lacy Duverney office following this appointment to have her incision checked. Patient concern of infection at incision site. No redness or drainage noted at right side of head surgical incision. Reports the first three fingers of her left hand intermittently go numb. Reports she cries often and for no reason. Reports she doesn't sleep well and has had to increase her restoril. Reports her appetite has improved thus, she has gained weight. Patient on a leave from work. Patient understands she is to be scheduled to see a neurologist in the near future. Reports vision in her left eye is still diminished but, over better than before. Left facial numbness continues. Reports seeing large amounts of bright red blood in her tarry stools since surgery. Patient has a hx of anemia and questions if a CBC is warranted.

## 2014-02-12 ENCOUNTER — Encounter: Payer: Self-pay | Admitting: Hematology and Oncology

## 2014-02-12 ENCOUNTER — Telehealth: Payer: Self-pay | Admitting: Hematology and Oncology

## 2014-02-12 ENCOUNTER — Ambulatory Visit (HOSPITAL_BASED_OUTPATIENT_CLINIC_OR_DEPARTMENT_OTHER): Payer: Medicaid Other | Admitting: Hematology and Oncology

## 2014-02-12 ENCOUNTER — Other Ambulatory Visit: Payer: Self-pay | Admitting: Hematology and Oncology

## 2014-02-12 ENCOUNTER — Ambulatory Visit: Payer: Medicaid Other

## 2014-02-12 VITALS — BP 125/78 | HR 97 | Temp 97.4°F | Resp 20 | Ht 69.0 in | Wt 149.2 lb

## 2014-02-12 DIAGNOSIS — R51 Headache: Secondary | ICD-10-CM

## 2014-02-12 DIAGNOSIS — C711 Malignant neoplasm of frontal lobe: Secondary | ICD-10-CM

## 2014-02-12 DIAGNOSIS — Z598 Other problems related to housing and economic circumstances: Secondary | ICD-10-CM | POA: Insufficient documentation

## 2014-02-12 DIAGNOSIS — R4702 Dysphasia: Secondary | ICD-10-CM

## 2014-02-12 DIAGNOSIS — R4789 Other speech disturbances: Secondary | ICD-10-CM

## 2014-02-12 DIAGNOSIS — R110 Nausea: Secondary | ICD-10-CM

## 2014-02-12 DIAGNOSIS — R079 Chest pain, unspecified: Secondary | ICD-10-CM

## 2014-02-12 DIAGNOSIS — R209 Unspecified disturbances of skin sensation: Secondary | ICD-10-CM

## 2014-02-12 DIAGNOSIS — N870 Mild cervical dysplasia: Secondary | ICD-10-CM | POA: Insufficient documentation

## 2014-02-12 DIAGNOSIS — D649 Anemia, unspecified: Secondary | ICD-10-CM

## 2014-02-12 DIAGNOSIS — Z599 Problem related to housing and economic circumstances, unspecified: Secondary | ICD-10-CM | POA: Insufficient documentation

## 2014-02-12 DIAGNOSIS — R519 Headache, unspecified: Secondary | ICD-10-CM

## 2014-02-12 DIAGNOSIS — R569 Unspecified convulsions: Secondary | ICD-10-CM | POA: Insufficient documentation

## 2014-02-12 DIAGNOSIS — R202 Paresthesia of skin: Secondary | ICD-10-CM

## 2014-02-12 HISTORY — DX: Dysphasia: R47.02

## 2014-02-12 HISTORY — DX: Headache, unspecified: R51.9

## 2014-02-12 HISTORY — DX: Nausea: R11.0

## 2014-02-12 MED ORDER — TEMOZOLOMIDE 180 MG PO CAPS: ORAL_CAPSULE | ORAL | Status: DC

## 2014-02-12 MED ORDER — GABAPENTIN 300 MG PO CAPS: 300.0000 mg | ORAL_CAPSULE | Freq: Three times a day (TID) | ORAL | Status: DC

## 2014-02-12 MED ORDER — HYDROMORPHONE HCL 2 MG PO TABS: 2.0000 mg | ORAL_TABLET | ORAL | Status: DC | PRN

## 2014-02-12 MED ORDER — PROMETHAZINE HCL 25 MG PO TABS
25.0000 mg | ORAL_TABLET | Freq: Four times a day (QID) | ORAL | Status: DC | PRN
Start: 2014-02-12 — End: 2014-03-10

## 2014-02-12 NOTE — Assessment & Plan Note (Signed)
I gave her prescription anti-emetics to take as needed.

## 2014-02-12 NOTE — Assessment & Plan Note (Signed)
This could be a form of neuropathic pain from recent surgery. Hopefully, gabapentin should help with this.

## 2014-02-12 NOTE — Assessment & Plan Note (Signed)
I reviewed with the patient & family the current guidelines using chemotherapy. The patient had made up her mind not to pursue radiation treatments or combination chemotherapy with procarbazine, CCNU and vincristine. Instead, she wants it only consider taking temozolomide. I also offer her second opinion with a medical oncologist at [facility] but she declined. I discussed with her, the risks, benefits and side effects of temozolomide. I will obtain insurance preauthorization. The patient is is aware that she has to have blood count monitored within 7 days after the start of temozolomide. I will see her back within a month from now for toxicity review. She would need followup imaging studies in the near future. Apparently, her surgeon is organizing that. I will request her pathology report from 3 weeks ago to be released.

## 2014-02-12 NOTE — Assessment & Plan Note (Signed)
She had complete hysterectomy with no residual disease.

## 2014-02-12 NOTE — Assessment & Plan Note (Signed)
I told her not to take tramadol. I gave her prescription hydromorphone to take as needed for headache and incisional pain.

## 2014-02-12 NOTE — Progress Notes (Signed)
Checked in new pt with no financial concerns. °

## 2014-02-12 NOTE — Assessment & Plan Note (Signed)
I recommend his speech and language therapist evaluation. She agreed to proceed.

## 2014-02-12 NOTE — Progress Notes (Signed)
[facility] NOTE  Patient Care Team: [name], MD as PCP - General (Internal Medicine)  CHIEF COMPLAINTS/PURPOSE OF CONSULTATION:  Oligodendroglioma status post total gross resection  HISTORY OF PRESENTING ILLNESS:  Victoria Bates 33 y.o. female is here because of diagnosis a brain tumor. The patient initially presented in 2013 with recurrent palpitation and panic attacks.  Subsequently, she started to have uncontrolled nausea, vomiting and 60 pound weight loss. In March of this year, she presented to the emergency department after sudden loss of left vision, imbalance of gait and left facial numbness. 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   09/27/2013 Pathology Results Accession: [id number] pathology showed grade 2 oligodendroglioma with mutant IDH1 protein, ATRX, OLIG 2 expression. p53 is negative, low Ki-67 labeling index. 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   11/06/2013 Imaging Repeat CT scan of the head showed resolution of abscess.   01/23/2014 Surgery Dr. Christella Noa perform a subtotal gross resection of the tumor.   01/24/2014 Imaging Repeat imaging studies showed persistent and residual disease.   Final pathology report from recent repeat resection was not available. Postoperatively, she has significant swelling around her right eye and conjunctiva hemorrhage. She denies loss of vision. She had recurrent seizures with jerking of her right hand and twitching of her right face since her surgery. Her last recurrent seizure was less than a week ago. She also complained of numbness of the right face, difficulties swallowing certain foods such as bread. She  has gained some weight since her surgery. She has tapered off dexamethasone successfully. She had very mild speech disturbances. The patient is motivated to fight her disease. She had lost her job recently and have financial hardship. She is currently at home with her 2 sons. She has occasional nausea but no vomiting. She is taking tramadol as needed for incisional pain and headaches. MEDICAL HISTORY:  Past Medical History  Diagnosis Date  . Liver hemangioma   . Anemia   . Headache(784.0)     migraines  . Anxiety   . Asthma     very mild- no inhaler use since July 2013  . Complication of anesthesia     takes longer for patient to go under anesthesia  . Brain tumor     Hx: of  . Infection     to right head incision  . Petit mal seizure status   . Dizziness   . Seizures     petit mal serizures most days  . Cancer 2003    skin  . Herniated disc   . Nausea alone 02/12/2014  . Dysphasia 02/12/2014  . Headache 02/12/2014    SURGICAL HISTORY: Past Surgical History  Procedure Laterality Date  . Cesarean section    . Coposcopy  08/06/2012    cervical biopsy  . Left knee surgery  2000    torn meniscus  . Right flank surgery  2005    bnign mass from flank area-right  . Esophagogastroduodenoscopy (egd) with propofol N/A 08/29/2012    Procedure: ESOPHAGOGASTRODUODENOSCOPY (EGD) WITH PROPOFOL;  Surgeon: Arta Silence, MD;  Location: [facility];  Service: Endoscopy;  Laterality: N/A;  . Colonoscopy with propofol N/A 08/29/2012    Procedure: COLONOSCOPY WITH  PROPOFOL;  Surgeon: Arta Silence, MD;  Location: [facility];  Service: Endoscopy;  Laterality: N/A;  . Wisdom tooth extraction    . Robotic assisted total hysterectomy with bilateral salpingo oopherectomy Bilateral 09/25/2012    Procedure: ROBOTIC ASSISTED TOTAL HYSTERECTOMY WITH BILATERAL SALPINGO OOPHORECTOMY;  Surgeon: Maeola Sarah. Landry Mellow, MD;  Location: [facility];  Service: Gynecology;  Laterality: Bilateral;  . Abdominal hysterectomy     . Craniotomy Right 09/26/2013    Procedure: Awake Right frontal open brain biopsy;  Surgeon: Winfield Cunas, MD;  Location: [facility];  Service: Neurosurgery;  Laterality: Right;  awake Right frontal open brain biopsy  . Craniotomy N/A 10/30/2013    Procedure: debridement of cranial wound  right temporal incision, ;  Surgeon: Winfield Cunas, MD;  Location: [facility];  Service: Neurosurgery;  Laterality: N/A;  . Craniotomy N/A 01/23/2014    Procedure: Awake Craniotomy with BrainLab;  Surgeon: Winfield Cunas, MD;  Location: [facility];  Service: Neurosurgery;  Laterality: N/A;  Awake Craniotomy  tumor with BrainLab  . Rotator cuff repair      SOCIAL HISTORY: History   Social History  . Marital Status: Single    Spouse Name: N/A    Number of Children: N/A  . Years of Education: N/A   Occupational History  . Not on file.   Social History Main Topics  . Smoking status: Never Smoker   . Smokeless tobacco: Never Used  . Alcohol Use: No  . Drug Use: No  . Sexual Activity: Yes    Birth Control/ Protection: None   Other Topics Concern  . Not on file   Social History Narrative  . No narrative on file    FAMILY HISTORY: Family History  Problem Relation Age of Onset  . Hypertension Mother     ALLERGIES:  is allergic to adhesive; latex; percocet; shellfish allergy; soma; and vancomycin.  MEDICATIONS:  Current Outpatient Prescriptions  Medication Sig Dispense Refill  . acetaminophen (TYLENOL) 500 MG tablet Take by mouth.      Marland Kitchen albuterol (PROVENTIL HFA;VENTOLIN HFA) 108 (90 BASE) MCG/ACT inhaler Inhale 2 puffs into the lungs every 6 (six) hours as needed for wheezing or shortness of breath.       . ALPRAZolam (XANAX) 1 MG tablet Take 1 mg by mouth 2 (two) times daily as needed (seizures).       Marland Kitchen aspirin 325 MG tablet Take 325 mg by mouth at bedtime as needed (pain/inflammation).       . carisoprodol (SOMA) 350 MG tablet Take 350 mg by mouth every 4 (four) hours as needed.       . diclofenac (VOLTAREN) 75 MG EC tablet Take 75 mg by mouth See admin instructions. Take 1 tablet (75 mg) every morning, may take an additional tablet in the evening as needed for inflammation      . levETIRAcetam (KEPPRA) 500 MG tablet Take 500 mg by mouth 4 (four) times daily. For seizure prevention      . temazepam (RESTORIL) 30 MG capsule Take 30 mg by mouth at bedtime as needed for sleep.      . traMADol (ULTRAM) 50 MG tablet every 6 (six) hours as needed.      . gabapentin (NEURONTIN) 300 MG capsule Take 1 capsule (300 mg total) by mouth 3 (three) times daily.  90 capsule  3  . HYDROmorphone (DILAUDID) 2 MG tablet Take 1 tablet (2 mg total) by mouth every 4 (four) hours as needed for  severe pain.  60 tablet  0  . promethazine (PHENERGAN) 12.5 MG tablet Take 1 tablet (12.5 mg total) by mouth every 6 (six) hours as needed for nausea.  12 tablet  0  . promethazine (PHENERGAN) 25 MG tablet Take 1 tablet (25 mg total) by mouth every 6 (six) hours as needed for nausea or vomiting.  90 tablet  3  . temozolomide (TEMODAR) 180 MG capsule May take on an empty stomach or at bedtime to decrease nausea & vomiting. Take 2 caps for 5 days every 28 days  10 capsule  9   No current facility-administered medications for this visit.    REVIEW OF SYSTEMS:   Constitutional: Denies fevers, chills or abnormal night sweats Eyes: Denies blurriness of vision, double vision or watery eyes Ears, nose, mouth, throat, and face: Denies mucositis or sore throat Respiratory: Denies cough, dyspnea or wheezes Cardiovascular: Denies palpitation, chest discomfort or lower extremity swelling Skin: Denies abnormal skin rashes Lymphatics: Denies new lymphadenopathy or easy bruising Behavioral/Psych: Mood is stable, no new changes  All other systems were reviewed with the patient and are negative.  PHYSICAL EXAMINATION: ECOG PERFORMANCE STATUS: 1 - Symptomatic but completely ambulatory  Filed Vitals:   02/12/14 1022   BP: 125/78  Pulse: 97  Temp: 97.4 F (36.3 C)  Resp: 20   Filed Weights   02/12/14 1022  Weight: 149 lb 3.2 oz (67.677 kg)    GENERAL:alert, no distress and comfortable. Well-healed surgical scar on the right side of her head SKIN: skin color, texture, turgor are normal, no rashes or significant lesions EYES: Noted scleral hemorrhage on the right eye.  OROPHARYNX:no exudate, no erythema and lips, buccal mucosa, and tongue normal  NECK: supple, thyroid normal size, non-tender, without nodularity LYMPH:  no palpable lymphadenopathy in the cervical, axillary or inguinal LUNGS: clear to auscultation and percussion with normal breathing effort HEART: regular rate & rhythm and no murmurs and no lower extremity edema ABDOMEN:abdomen soft, non-tender and normal bowel sounds Musculoskeletal:no cyanosis of digits and no clubbing  PSYCH: alert & oriented x 3 with fluent speech NEURO: no focal motor/sensory deficits. Very mild left facial droop is noted.  LABORATORY DATA:  I have reviewed the data as listed Lab Results  Component Value Date   WBC 7.5 01/27/2014   HGB 10.6* 01/27/2014   HCT 32.0* 01/27/2014   MCV 94.1 01/27/2014   PLT 222 01/27/2014    Recent Labs  09/20/13 0455  12/31/13 0946 01/16/14 1023 01/27/14 0611  NA 138  < > 139 138 138  K 3.6*  < > 4.9 5.1 4.4  CL 100  < > 102 101 99  CO2 23  < > _0 GLUCOSE 87  < > 92 90 115*  BUN 6  < > _1 CREATININE 0.58  < > 0.53 0.58 0.48*  CALCIUM 9.5  < > 9.5 9.4 9.0  GFRNONAA >90  < > >90 >90 >90  GFRAA >90  < > >90 >90 >90  PROT 6.9  --   --   --   --   ALBUMIN 3.9  --   --   --   --   AST 12  --   --   --   --   ALT 8  --   --   --   --   ALKPHOS 40  --   --   --   --   BILITOT 0.7  --   --   --   --   < > =  values in this interval not displayed.  RADIOGRAPHIC STUDIES: I have personally reviewed the radiological images as listed and agreed with the findings in the report. Ct Head Wo Contrast  01/16/2014    CLINICAL DATA:  Right frontal low-grade oligodendroglioma (WHO grade II).  EXAM: CT HEAD WITHOUT CONTRAST  TECHNIQUE: Contiguous axial images were obtained from the base of the skull through the vertex without intravenous contrast.  COMPARISON:  Brain MRI 12/26/2013  FINDINGS: Sequelae of right frontal craniotomy and biopsy of right frontal tumor are again identified. Subtle cortical and subcortical hypoattenuation in the right frontal lobe is noted at the site of known tumor, better demonstrated on prior MRI. There is no definite evidence of acute cortical infarct, intracranial hemorrhage, midline shift, or extra-axial fluid collection. Ventricles and sulci are within normal limits.  Orbits are unremarkable. Mastoid air cells and visualized paranasal sinuses are clear.  IMPRESSION: 1. Sequelae of prior biopsy of right frontal tumor. 2. No acute intracranial abnormality identified.   Electronically Signed   By: Logan Bores   On: 01/16/2014 11:02   Mr Brain Wo Contrast  01/24/2014   CLINICAL DATA:  Tumor resection.  EXAM: MRI HEAD WITHOUT CONTRAST  TECHNIQUE: Multiplanar, multiecho pulse sequences of the brain and surrounding structures were obtained without intravenous contrast.  COMPARISON:  CT head without contrast 01/16/2014. MRI brain 12/26/2013  FINDINGS: The patient is status post right frontal craniotomy for resection of tumor. A fluid level is present within the surgical cavity with layering blood products. T2 and FLAIR changes deep to the resection cavity extend to the lateral margin of the right lateral ventricle. These are deeper then on the preoperative scan and likely related to periorbital edema rather than tumor.  There is significant restricted diffusion along the medial and superior aspect of the resection cavity.  A focus of susceptibility is noted posterior to the right parietal lobe likely representing a small on a blood within the subarachnoid space. This was not present on the prior exams.  No other significant hemorrhage is evident outside of the resection cavity. Midline structures are within normal limits. There is no significant midline shift. The remainder of the brain remains normal.  Flow is present in the major intracranial arteries. The globes and orbits are intact. The paranasal sinuses and mastoid air cells are clear.  IMPRESSION: 1. Status post resection of right frontal parietal tumor with layering blood products in the surgical cavity. 2. Abnormal T2 and FLAIR signal superior and medial to lesion. The area immediately superior to the resection cavity corresponds with preoperative T2 signal and may in part represent residual tumor. These areas of signal abnormality demonstrate restricted diffusion. The alternative explanation for the signal changes is acute infarct. Evolution of T2 changes in the area superior and medial to the resection cavity we will confirm these suspicions on follow-up exam 3. Focus of blood products posteriorly in the right parietal lobe is likely related to the resection with layering blood into the subarachnoid space posteriorly.   Electronically Signed   By: Lawrence Santiago M.D.   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 The patient had made up her mind not to pursue radiation treatments or combination chemotherapy with procarbazine, CCNU and vincristine. Instead, she wants it only consider taking temozolomide. I also offer her second opinion with  a medical oncologist at [facility] but she declined. I discussed with her, the risks, benefits and side effects of temozolomide. I will obtain insurance preauthorization. The patient is is aware that she has to have blood count monitored within 7 days after the start of temozolomide. I will see her back within a month  from now for toxicity review. She would need followup imaging studies in the near future. Apparently, her surgeon is organizing that. I will request her pathology report from 3 weeks ago to be released.  Seizures She had recurrent seizures recently. Although she denies loss of consciousness, a family member thought she did lost consciousness briefly last week. She is currently on high-dose Keppra. She has neurology consultation pending. I am concerned about driving and told her not to drive until her evaluation by a neurologist. In the meantime, I am starting her on Neurontin for neuropathic pain which theoretically should help with this seizure disorder. I also recommend discontinuation of tramadol due to risk of seizure.  Paresthesias This could be a form of neuropathic pain from recent surgery. Hopefully, gabapentin should help with this.  Nausea alone I gave her prescription anti-emetics to take as needed.  Headache I told her not to take tramadol. I gave her prescription hydromorphone to take as needed for headache and incisional pain.  Dysphasia I recommend his speech and language therapist evaluation. She agreed to proceed.  Financial difficulty She has significant financial hardship. She apparently is coping well. I will get our social worker to consult and follow.  CIN I (cervical intraepithelial neoplasia I) She had complete hysterectomy with no residual disease.  Anemia, unspecified This is likely anemia of chronic disease and recent surgery. The patient denies recent history of bleeding such as epistaxis, hematuria or hematochezia. She is asymptomatic from the anemia. We will observe for now.      Orders Placed This Encounter  Procedures  . CBC with Differential    Standing Status: Standing     Number of Occurrences: 33     Standing Expiration Date: 02/13/2015  . Comprehensive metabolic panel    Standing Status: Standing     Number of Occurrences: 33      Standing Expiration Date: 02/13/2015  . Ambulatory referral to Speech Therapy    Referral Priority:  Routine    Referral Type:  Speech Therapy    Referral Reason:  Specialty Services Required    Requested Specialty:  Speech Pathology    Number of Visits Requested:  1    All questions were answered. The patient knows to call the clinic with any problems, questions or concerns. I spent 40 minutes counseling the patient face to face. The total time spent in the appointment was 60 minutes and more than 50% was on counseling.     [facility], [place], MD 02/12/2014 9:27 PM

## 2014-02-12 NOTE — Assessment & Plan Note (Signed)
She has significant financial hardship. She apparently is coping well. I will get our social worker to consult and follow.

## 2014-02-12 NOTE — Assessment & Plan Note (Signed)
She had recurrent seizures recently. Although she denies loss of consciousness, a family member thought she did lost consciousness briefly last week. She is currently on high-dose Keppra. She has neurology consultation pending. I am concerned about driving and told her not to drive until her evaluation by a neurologist. In the meantime, I am starting her on Neurontin for neuropathic pain which theoretically should help with this seizure disorder. I also recommend discontinuation of tramadol due to risk of seizure.

## 2014-02-12 NOTE — Telephone Encounter (Signed)
Pt confirmed labs/ov per 07/29 POF, gave pt AVS...KJ °

## 2014-02-12 NOTE — Assessment & Plan Note (Signed)
This is likely anemia of chronic disease and recent surgery. The patient denies recent history of bleeding such as epistaxis, hematuria or hematochezia. She is asymptomatic from the anemia. We will observe for now.

## 2014-02-13 ENCOUNTER — Other Ambulatory Visit: Payer: Self-pay | Admitting: *Deleted

## 2014-02-14 ENCOUNTER — Encounter: Payer: Self-pay | Admitting: *Deleted

## 2014-02-14 NOTE — Progress Notes (Signed)
Victoria Bates  Clinical Social Bates was referred by Futures trader for assessment of psychosocial needs.  Clinical Social Worker contacted patient at home to offer support and assess for needs.  Victoria Bates was appreciative of CSW support and shared she is coping well with her diagnosis.  The patient expressed her main concern was how her partner of 13 years was coping with patient's diagnosis.  CSW and patient discussed possible strategies for helping her partner and other caregivers during this time including communication techniques and possible resources.  She expressed that her children are coping well and she encourages open communication with them regarding her medical situation.  CSW strongly encouraged patient/caregiver to attend brain tumor support group as well the upcoming Family Night program.  Victoria Bates was very receptive to support services and plans to follow up with CSW as needed.  Polo Riley, MSW, LCSW, OSW-C Clinical Social Worker [facility] [phone]

## 2014-02-17 ENCOUNTER — Telehealth: Payer: Self-pay | Admitting: Hematology and Oncology

## 2014-02-17 ENCOUNTER — Other Ambulatory Visit: Payer: Self-pay | Admitting: Hematology and Oncology

## 2014-02-17 ENCOUNTER — Telehealth: Payer: Self-pay | Admitting: *Deleted

## 2014-02-17 NOTE — Telephone Encounter (Signed)
s.w. pt and advised on 8.26 lab...pt ok and aware

## 2014-02-17 NOTE — Telephone Encounter (Signed)
Pt left VM asking about status of Temodar Rx? States she has not heard anything about it yet.  S/w [facility] and medication is ready w/ $3 co pay.  Informed Dr. Alvy Bimler of medication ready and she instructed for pt to start tomorrow w/ lab in one week on 8/12 and 8/19 w/ f/u appt on 9/02.   Informed pt of above and she informs she is going out of town next week and will not return until Aug 16 th. She will not be able to have lab on 8/12 as requested.  Informed Dr. Alvy Bimler and she instructs for pt to wait to start Temodar until she returns from out of town on 8/16.  Then have lab on 8/26.  Keep f/u on 9/02 as scheduled.  Pt verbalized understanding.

## 2014-02-26 ENCOUNTER — Other Ambulatory Visit: Payer: Self-pay | Admitting: *Deleted

## 2014-02-26 ENCOUNTER — Other Ambulatory Visit: Payer: Medicaid Other

## 2014-02-26 DIAGNOSIS — R4702 Dysphasia: Secondary | ICD-10-CM

## 2014-02-26 DIAGNOSIS — R110 Nausea: Secondary | ICD-10-CM

## 2014-02-26 DIAGNOSIS — R51 Headache: Principal | ICD-10-CM

## 2014-02-26 DIAGNOSIS — R079 Chest pain, unspecified: Secondary | ICD-10-CM

## 2014-02-26 DIAGNOSIS — R519 Headache, unspecified: Secondary | ICD-10-CM

## 2014-02-26 DIAGNOSIS — C711 Malignant neoplasm of frontal lobe: Secondary | ICD-10-CM

## 2014-02-26 DIAGNOSIS — R202 Paresthesia of skin: Secondary | ICD-10-CM

## 2014-02-26 MED ORDER — HYDROMORPHONE HCL 2 MG PO TABS: 2.0000 mg | ORAL_TABLET | ORAL | Status: DC | PRN

## 2014-03-04 ENCOUNTER — Telehealth: Payer: Self-pay | Admitting: *Deleted

## 2014-03-04 MED ORDER — ONDANSETRON HCL 8 MG PO TABS: 8.0000 mg | ORAL_TABLET | Freq: Three times a day (TID) | ORAL | Status: DC | PRN

## 2014-03-04 NOTE — Telephone Encounter (Signed)
Pt reports she started taking Temodar on Sunday night and she had severe nausea/vomiting w/i 3 hrs of taking medication.  She took phenergan multiple times Sunday night.  Monday morning she started having diarrhea and had about 10 episodes yesterday.  Monday night she took the Temodar w/ Zofran 8 mg and had only one episode of vomiting and only one episode of diarrhea this morning.  She does not have her own Rx for Zofran but took her son's and asks for Rx from Dr. Alvy Bimler to take w/ her Temodar this week.  Pt also reports she has 3 open areas on her head from sutures popping out s/p craniotomy by Dr. Cyndy Freeze.  She states the areas are oozing a little yellow pus but not nearly as bad as last time.  She wants to wait to see Dr. Cyndy Freeze about this until next week after she completes this week of chemo pills.   Notified Dr. Alvy Bimler of above and she ordered Zofran and instructs for pt to call Dr. Hewitt Shorts office today to assess the open areas for infection.  Dr. Alvy Bimler states ok for pt to start antibiotics if ordered and have sutures replaced by Dr. Cyndy Freeze if ordered while on the Temodar.  Pt verbalized understanding.

## 2014-03-05 ENCOUNTER — Other Ambulatory Visit: Payer: Medicaid Other

## 2014-03-06 ENCOUNTER — Other Ambulatory Visit: Payer: Self-pay | Admitting: Neurosurgery

## 2014-03-06 ENCOUNTER — Telehealth: Payer: Self-pay | Admitting: *Deleted

## 2014-03-06 NOTE — Telephone Encounter (Signed)
Pt called to let Dr Alvy Bimler know that Dr Cyndy Freeze has scheduled surgery for Friday 03/14/14 @ 1115 to remove sutures. Has started keflex.

## 2014-03-10 ENCOUNTER — Encounter (HOSPITAL_COMMUNITY): Payer: Self-pay | Admitting: Pharmacy Technician

## 2014-03-11 ENCOUNTER — Telehealth: Payer: Self-pay | Admitting: Hematology and Oncology

## 2014-03-11 ENCOUNTER — Other Ambulatory Visit: Payer: Self-pay | Admitting: Hematology and Oncology

## 2014-03-11 ENCOUNTER — Telehealth: Payer: Self-pay | Admitting: Medical Oncology

## 2014-03-11 ENCOUNTER — Ambulatory Visit (HOSPITAL_BASED_OUTPATIENT_CLINIC_OR_DEPARTMENT_OTHER): Payer: Medicaid Other

## 2014-03-11 DIAGNOSIS — R4702 Dysphasia: Secondary | ICD-10-CM

## 2014-03-11 DIAGNOSIS — R51 Headache: Secondary | ICD-10-CM

## 2014-03-11 DIAGNOSIS — C711 Malignant neoplasm of frontal lobe: Secondary | ICD-10-CM

## 2014-03-11 DIAGNOSIS — R519 Headache, unspecified: Secondary | ICD-10-CM

## 2014-03-11 DIAGNOSIS — R202 Paresthesia of skin: Secondary | ICD-10-CM

## 2014-03-11 DIAGNOSIS — R110 Nausea: Secondary | ICD-10-CM

## 2014-03-11 DIAGNOSIS — R079 Chest pain, unspecified: Secondary | ICD-10-CM

## 2014-03-11 LAB — CBC WITH DIFFERENTIAL/PLATELET
BASO%: 1 % (ref 0.0–2.0)
Basophils Absolute: 0 10*3/uL (ref 0.0–0.1)
EOS%: 1.2 % (ref 0.0–7.0)
Eosinophils Absolute: 0.1 10*3/uL (ref 0.0–0.5)
HCT: 37.7 % (ref 34.8–46.6)
HGB: 12.5 g/dL (ref 11.6–15.9)
LYMPH%: 35.8 % (ref 14.0–49.7)
MCH: 31.1 pg (ref 25.1–34.0)
MCHC: 33.2 g/dL (ref 31.5–36.0)
MCV: 93.8 fL (ref 79.5–101.0)
MONO#: 0.4 10*3/uL (ref 0.1–0.9)
MONO%: 9.2 % (ref 0.0–14.0)
NEUT#: 2.2 10*3/uL (ref 1.5–6.5)
NEUT%: 52.8 % (ref 38.4–76.8)
Platelets: 234 10*3/uL (ref 145–400)
RBC: 4.02 10*6/uL (ref 3.70–5.45)
RDW: 11.7 % (ref 11.2–14.5)
WBC: 4.1 10*3/uL (ref 3.9–10.3)
lymph#: 1.5 10*3/uL (ref 0.9–3.3)

## 2014-03-11 LAB — COMPREHENSIVE METABOLIC PANEL (CC13)
ALT: 25 U/L (ref 0–55)
AST: 21 U/L (ref 5–34)
Albumin: 4 g/dL (ref 3.5–5.0)
Alkaline Phosphatase: 60 U/L (ref 40–150)
Anion Gap: 8 mEq/L (ref 3–11)
BUN: 8.7 mg/dL (ref 7.0–26.0)
CO2: 27 mEq/L (ref 22–29)
Calcium: 9.2 mg/dL (ref 8.4–10.4)
Chloride: 104 mEq/L (ref 98–109)
Creatinine: 0.6 mg/dL (ref 0.6–1.1)
Glucose: 94 mg/dl (ref 70–140)
Potassium: 4.5 mEq/L (ref 3.5–5.1)
Sodium: 139 mEq/L (ref 136–145)
Total Bilirubin: 0.66 mg/dL (ref 0.20–1.20)
Total Protein: 7.3 g/dL (ref 6.4–8.3)

## 2014-03-11 MED ORDER — HYDROMORPHONE HCL 4 MG PO TABS: 4.0000 mg | ORAL_TABLET | Freq: Four times a day (QID) | ORAL | Status: DC | PRN

## 2014-03-11 NOTE — Telephone Encounter (Signed)
Message copied by Maxwell Marion on Tue Mar 11, 2014  3:18 PM ------      Message from: [facility], [place]: Tue Mar 11, 2014  2:15 PM      Regarding: labs       PLs let her know labs are normal      ----- Message -----         From: [facility] in [facility] Interface         Sent: 03/11/2014   9:16 AM           To: Heath Lark, MD                   ------

## 2014-03-11 NOTE — Telephone Encounter (Signed)
Patient called informing office that this coming Friday she has an appt with Dr. Christella Noa for cranial suture removal.  Also asking for refill of hydromorphone. Per MD, ok to refill.  Patient also states that gabapentin at the current dosage is making her drowsy, states has been helping with pain and seizures, but asking about dose reduction during day and keeping same dosage at night.   Review with Dr Alvy Bimler, suggestion for patient to take medication in evening to reduce drowsiness in daytime or to split and take part of dosage in am and the rest in pm.   Patient expressed verbal understanding, and knows prescription ready for p.u. Knows to call office with further questions or concerns,

## 2014-03-11 NOTE — Telephone Encounter (Signed)
pt came in a day early for lab....sent to lab today....pt ok and aware

## 2014-03-11 NOTE — Telephone Encounter (Signed)
Per MD, informed patient of lab results. Patient expressed thanks, denies further questions.

## 2014-03-12 ENCOUNTER — Other Ambulatory Visit: Payer: Medicaid Other

## 2014-03-12 ENCOUNTER — Other Ambulatory Visit (HOSPITAL_COMMUNITY): Payer: Self-pay | Admitting: *Deleted

## 2014-03-12 NOTE — Pre-Procedure Instructions (Signed)
Victoria Bates  03/12/2014   Your procedure is scheduled on:  Friday, March 14, 2014 at 1:15 PM.   Report to [facility] Entrance "A" Admitting Office at 11:15 AM.   Call this number if you have problems the morning of surgery: [phone]   Remember:   Do not eat food or drink liquids after midnight tonight.   Take these medicines the morning of surgery with A SIP OF WATER: gabapentin (NEURONTIN), levETIRAcetam (KEPPRA), carisoprodol (SOMA), HYDROmorphone (DILAUDID) - if needed, ALPRAZolam [name] - if needed, albuterol (PROVENTIL HFA;VENTOLIN HFA) inhaler - if needed.   Do not wear jewelry, make-up or nail polish.  Do not wear lotions, powders, or perfumes. You may wear deodorant.  Do not shave 48 hours prior to surgery.   Do not bring valuables to the hospital.  [facility] is not responsible                  for any belongings or valuables.               Contacts, dentures or bridgework may not be worn into surgery.  Leave suitcase in the car. After surgery it may be brought to your room.  For patients admitted to the hospital, discharge time is determined by your                treatment team.              Special Instructions: [place] - Preparing for Surgery  Before surgery, you can play an important role.  Because skin is not sterile, your skin needs to be as free of germs as possible.  You can reduce the number of germs on you skin by washing with CHG (chlorahexidine gluconate) soap before surgery.  CHG is an antiseptic cleaner which kills germs and bonds with the skin to continue killing germs even after washing.  Please DO NOT use if you have an allergy to CHG or antibacterial soaps.  If your skin becomes reddened/irritated stop using the CHG and inform your nurse when you arrive at Short Stay.  Do not shave (including legs and underarms) for at least 48 hours prior to the first CHG shower.  You may shave your face.  Please follow these instructions  carefully:   1.  Shower with CHG Soap the night before surgery and the                                morning of Surgery.  2.  If you choose to wash your hair, wash your hair first as usual with your       normal shampoo.  3.  After you shampoo, rinse your hair and body thoroughly to remove the                      Shampoo.  4.  Use CHG as you would any other liquid soap.  You can apply chg directly       to the skin and wash gently with scrungie or a clean washcloth.  5.  Apply the CHG Soap to your body ONLY FROM THE NECK DOWN.        Do not use on open wounds or open sores.  Avoid contact with your eyes, ears, mouth and genitals (private parts).  Wash genitals (private parts) with your normal soap.  6.  Wash thoroughly, paying special attention  to the area where your surgery        will be performed.  7.  Thoroughly rinse your body with warm water from the neck down.  8.  DO NOT shower/wash with your normal soap after using and rinsing off       the CHG Soap.  9.  Pat yourself dry with a clean towel.            10.  Wear clean pajamas.            11.  Place clean sheets on your bed the night of your first shower and do not        sleep with pets.  Day of Surgery  Do not apply any lotions the morning of surgery.  Please wear clean clothes to the hospital/surgery center.      Please read over the following fact sheets that you were given: Pain Booklet, Coughing and Deep Breathing and Surgical Site Infection Prevention

## 2014-03-13 ENCOUNTER — Inpatient Hospital Stay (HOSPITAL_COMMUNITY)
Admission: RE | Admit: 2014-03-13 | Discharge: 2014-03-13 | Disposition: A | Discharge status: Home / Self Care | Payer: Medicaid Other | Source: Ambulatory Visit

## 2014-03-13 ENCOUNTER — Other Ambulatory Visit (HOSPITAL_COMMUNITY): Payer: Self-pay | Admitting: Neurosurgery

## 2014-03-13 ENCOUNTER — Encounter (HOSPITAL_COMMUNITY): Payer: Self-pay | Admitting: *Deleted

## 2014-03-13 ENCOUNTER — Ambulatory Visit (HOSPITAL_COMMUNITY)
Admission: RE | Admit: 2014-03-13 | Discharge: 2014-03-13 | Disposition: A | Discharge status: Home / Self Care | Payer: Medicaid Other | Source: Ambulatory Visit | Attending: Neurosurgery | Admitting: Neurosurgery

## 2014-03-13 DIAGNOSIS — Z9889 Other specified postprocedural states: Secondary | ICD-10-CM | POA: Insufficient documentation

## 2014-03-13 DIAGNOSIS — C719 Malignant neoplasm of brain, unspecified: Secondary | ICD-10-CM

## 2014-03-13 DIAGNOSIS — I6350 Cerebral infarction due to unspecified occlusion or stenosis of unspecified cerebral artery: Secondary | ICD-10-CM | POA: Diagnosis not present

## 2014-03-13 MED ORDER — GADOBENATE DIMEGLUMINE 529 MG/ML IV SOLN
13.0000 mL | Freq: Once | INTRAVENOUS | Status: AC | PRN
  Administered 2014-03-13: 13 mL via INTRAVENOUS

## 2014-03-13 MED ORDER — CEFAZOLIN SODIUM-DEXTROSE 2-3 GM-% IV SOLR
2.0000 g | INTRAVENOUS | Status: DC
  Filled 2014-03-13: qty 50

## 2014-03-14 ENCOUNTER — Encounter (HOSPITAL_COMMUNITY)
Admission: RE | Disposition: A | Discharge status: Home / Self Care | Payer: Self-pay | Source: Ambulatory Visit | Attending: Neurosurgery

## 2014-03-14 ENCOUNTER — Encounter (HOSPITAL_COMMUNITY): Payer: Self-pay | Admitting: Anesthesiology

## 2014-03-14 ENCOUNTER — Encounter (HOSPITAL_COMMUNITY): Payer: Self-pay | Admitting: *Deleted

## 2014-03-14 ENCOUNTER — Ambulatory Visit (HOSPITAL_COMMUNITY)
Admission: RE | Admit: 2014-03-14 | Discharge: 2014-03-14 | Disposition: A | Discharge status: Home / Self Care | Payer: Medicaid Other | Source: Ambulatory Visit | Attending: Neurosurgery | Admitting: Neurosurgery

## 2014-03-14 DIAGNOSIS — Y839 Surgical procedure, unspecified as the cause of abnormal reaction of the patient, or of later complication, without mention of misadventure at the time of the procedure: Secondary | ICD-10-CM | POA: Insufficient documentation

## 2014-03-14 DIAGNOSIS — T8140XA Infection following a procedure, unspecified, initial encounter: Secondary | ICD-10-CM | POA: Diagnosis present

## 2014-03-14 SURGERY — CRANIOTOMY BONE FLAP/PROSTHETIC PLATE
Anesthesia: General

## 2014-03-14 MED ORDER — LIDOCAINE HCL (CARDIAC) 20 MG/ML IV SOLN
INTRAVENOUS | Status: AC
  Filled 2014-03-14: qty 5

## 2014-03-14 MED ORDER — ONDANSETRON HCL 4 MG/2ML IJ SOLN
INTRAMUSCULAR | Status: AC
  Filled 2014-03-14: qty 2

## 2014-03-14 MED ORDER — FENTANYL CITRATE 0.05 MG/ML IJ SOLN
INTRAMUSCULAR | Status: AC
  Filled 2014-03-14: qty 5

## 2014-03-14 MED ORDER — LACTATED RINGERS IV SOLN: INTRAVENOUS | Status: DC

## 2014-03-14 MED ORDER — LACTATED RINGERS IV SOLN
Freq: Once | INTRAVENOUS | Status: AC
  Administered 2014-03-14: 50 mL/h via INTRAVENOUS

## 2014-03-14 MED ORDER — SCOPOLAMINE 1 MG/3DAYS TD PT72: 1.0000 | MEDICATED_PATCH | TRANSDERMAL | Status: DC

## 2014-03-14 MED ORDER — ROCURONIUM BROMIDE 50 MG/5ML IV SOLN
INTRAVENOUS | Status: AC
  Filled 2014-03-14: qty 1

## 2014-03-14 MED ORDER — MIDAZOLAM HCL 2 MG/2ML IJ SOLN
INTRAMUSCULAR | Status: AC
  Filled 2014-03-14: qty 2

## 2014-03-14 MED ORDER — PROPOFOL 10 MG/ML IV BOLUS
INTRAVENOUS | Status: AC
Start: 2014-03-14 — End: 2014-03-14
  Filled 2014-03-14: qty 20

## 2014-03-14 SURGICAL SUPPLY — 56 items
BANDAGE GAUZE 4  KLING STR (GAUZE/BANDAGES/DRESSINGS) IMPLANT
BIT DRILL WIRE PASS 1.3MM (BIT) IMPLANT
BNDG GAUZE ELAST 4 BULKY (GAUZE/BANDAGES/DRESSINGS) ×6 IMPLANT
BUR ACORN 6.0 PRECISION (BURR) IMPLANT
BUR ROUTER D-58 CRANI (BURR) IMPLANT
CANISTER SUCT 3000ML (MISCELLANEOUS) ×3 IMPLANT
CONT SPEC 4OZ CLIKSEAL STRL BL (MISCELLANEOUS) ×3 IMPLANT
CORDS BIPOLAR (ELECTRODE) ×3 IMPLANT
DRAIN PENROSE 1/2X12 LTX STRL (WOUND CARE) IMPLANT
DRAIN SNY WOU 7FLT (WOUND CARE) IMPLANT
DRAPE NEUROLOGICAL W/INCISE (DRAPES) ×3 IMPLANT
DRAPE SURG 17X23 STRL (DRAPES) IMPLANT
DRAPE WARM FLUID 44X44 (DRAPE) ×3 IMPLANT
DRILL WIRE PASS 1.3MM (BIT)
DURAPREP 6ML APPLICATOR 50/CS (WOUND CARE) ×3 IMPLANT
ELECT CAUTERY BLADE 6.4 (BLADE) ×3 IMPLANT
ELECT REM PT RETURN 9FT ADLT (ELECTROSURGICAL) ×2
ELECTRODE REM PT RTRN 9FT ADLT (ELECTROSURGICAL) ×2 IMPLANT
EVACUATOR SILICONE 100CC (DRAIN) IMPLANT
GAUZE SPONGE 4X4 12PLY STRL (GAUZE/BANDAGES/DRESSINGS) IMPLANT
GAUZE SPONGE 4X4 16PLY XRAY LF (GAUZE/BANDAGES/DRESSINGS) IMPLANT
GLOVE ECLIPSE 6.5 STRL STRAW (GLOVE) ×3 IMPLANT
GLOVE EXAM NITRILE LRG STRL (GLOVE) IMPLANT
GLOVE EXAM NITRILE MD LF STRL (GLOVE) IMPLANT
GLOVE EXAM NITRILE XL STR (GLOVE) IMPLANT
GLOVE EXAM NITRILE XS STR PU (GLOVE) IMPLANT
GOWN STRL REUS W/ TWL LRG LVL3 (GOWN DISPOSABLE) IMPLANT
GOWN STRL REUS W/ TWL XL LVL3 (GOWN DISPOSABLE) IMPLANT
GOWN STRL REUS W/TWL 2XL LVL3 (GOWN DISPOSABLE) IMPLANT
GOWN STRL REUS W/TWL LRG LVL3 (GOWN DISPOSABLE)
GOWN STRL REUS W/TWL XL LVL3 (GOWN DISPOSABLE)
HOOK DURA (MISCELLANEOUS) IMPLANT
KIT BASIN OR (CUSTOM PROCEDURE TRAY) ×3 IMPLANT
KIT ROOM TURNOVER OR (KITS) ×3 IMPLANT
NEEDLE HYPO 22GX1.5 SAFETY (NEEDLE) ×3 IMPLANT
NS IRRIG 1000ML POUR BTL (IV SOLUTION) ×3 IMPLANT
PACK CRANIOTOMY (CUSTOM PROCEDURE TRAY) ×3 IMPLANT
PAD ABD 8X10 STRL (GAUZE/BANDAGES/DRESSINGS) IMPLANT
PAD ARMBOARD 7.5X6 YLW CONV (MISCELLANEOUS) ×3 IMPLANT
PATTIES SURGICAL .5 X.5 (GAUZE/BANDAGES/DRESSINGS) IMPLANT
PATTIES SURGICAL .5 X3 (DISPOSABLE) IMPLANT
PATTIES SURGICAL 1X1 (DISPOSABLE) IMPLANT
PIN MAYFIELD SKULL DISP (PIN) IMPLANT
SPONGE NEURO XRAY DETECT 1X3 (DISPOSABLE) IMPLANT
SPONGE SURGIFOAM ABS GEL 100 (HEMOSTASIS) ×3 IMPLANT
STAPLER SKIN PROX WIDE 3.9 (STAPLE) ×3 IMPLANT
SUT ETHILON 3 0 FSL (SUTURE) IMPLANT
SUT NURALON 4 0 TR CR/8 (SUTURE) ×6 IMPLANT
SUT VIC AB 2-0 CT2 18 VCP726D (SUTURE) ×6 IMPLANT
SYR 20ML ECCENTRIC (SYRINGE) ×3 IMPLANT
SYR CONTROL 10ML LL (SYRINGE) ×3 IMPLANT
TOWEL OR 17X24 6PK STRL BLUE (TOWEL DISPOSABLE) ×3 IMPLANT
TOWEL OR 17X26 10 PK STRL BLUE (TOWEL DISPOSABLE) ×6 IMPLANT
TRAY FOLEY CATH 16FRSI W/METER (SET/KITS/TRAYS/PACK) IMPLANT
UNDERPAD 30X30 INCONTINENT (UNDERPADS AND DIAPERS) IMPLANT
WATER STERILE IRR 1000ML POUR (IV SOLUTION) ×3 IMPLANT

## 2014-03-14 NOTE — Anesthesia Preprocedure Evaluation (Addendum)
Anesthesia Evaluation  Patient identified by MRN, date of birth, ID band Patient awake    Reviewed: Allergy & Precautions, H&P , NPO status   Airway Mallampati: II TM Distance: >3 FB Neck ROM: Full    Dental  (+) Teeth Intact   Pulmonary asthma (uses inhalers) ,  breath sounds clear to auscultation        Cardiovascular Rhythm:Regular Rate:Normal     Neuro/Psych  Headaches, SP brain tumor resection and radiation RX  Neuromuscular disease    GI/Hepatic   Endo/Other    Renal/GU   Female genitourinary complaint: SP HYST.     Musculoskeletal   Abdominal (+)  Abdomen: soft.    Peds  Hematology   Anesthesia Other Findings   Reproductive/Obstetrics                         Anesthesia Physical Anesthesia Plan  ASA: II  Anesthesia Plan: General   Post-op Pain Management:    Induction: Intravenous  Airway Management Planned: Oral ETT  Additional Equipment:   Intra-op Plan:   Post-operative Plan: Extubation in OR  Informed Consent: I have reviewed the patients History and Physical, chart, labs and discussed the procedure including the risks, benefits and alternatives for the proposed anesthesia with the patient or authorized representative who has indicated his/her understanding and acceptance.     Plan Discussed with:   Anesthesia Plan Comments:         Anesthesia Quick Evaluation

## 2014-03-19 ENCOUNTER — Ambulatory Visit (HOSPITAL_BASED_OUTPATIENT_CLINIC_OR_DEPARTMENT_OTHER): Payer: Medicaid Other | Admitting: Hematology and Oncology

## 2014-03-19 ENCOUNTER — Other Ambulatory Visit (HOSPITAL_BASED_OUTPATIENT_CLINIC_OR_DEPARTMENT_OTHER): Payer: Medicaid Other

## 2014-03-19 ENCOUNTER — Telehealth: Payer: Self-pay | Admitting: Hematology and Oncology

## 2014-03-19 ENCOUNTER — Other Ambulatory Visit: Payer: Self-pay | Admitting: Hematology and Oncology

## 2014-03-19 ENCOUNTER — Ambulatory Visit: Payer: Self-pay | Admitting: Neurology

## 2014-03-19 VITALS — BP 119/74 | HR 78 | Temp 98.4°F | Resp 24 | Ht 68.0 in | Wt 154.5 lb

## 2014-03-19 DIAGNOSIS — C711 Malignant neoplasm of frontal lobe: Secondary | ICD-10-CM

## 2014-03-19 DIAGNOSIS — R51 Headache: Secondary | ICD-10-CM

## 2014-03-19 DIAGNOSIS — R110 Nausea: Secondary | ICD-10-CM

## 2014-03-19 DIAGNOSIS — R569 Unspecified convulsions: Secondary | ICD-10-CM

## 2014-03-19 DIAGNOSIS — R079 Chest pain, unspecified: Secondary | ICD-10-CM

## 2014-03-19 DIAGNOSIS — G44011 Episodic cluster headache, intractable: Secondary | ICD-10-CM

## 2014-03-19 DIAGNOSIS — R4702 Dysphasia: Secondary | ICD-10-CM

## 2014-03-19 DIAGNOSIS — R202 Paresthesia of skin: Secondary | ICD-10-CM

## 2014-03-19 DIAGNOSIS — R519 Headache, unspecified: Secondary | ICD-10-CM

## 2014-03-19 LAB — CBC WITH DIFFERENTIAL/PLATELET
BASO%: 0.3 % (ref 0.0–2.0)
Basophils Absolute: 0 10*3/uL (ref 0.0–0.1)
EOS%: 0.9 % (ref 0.0–7.0)
Eosinophils Absolute: 0 10*3/uL (ref 0.0–0.5)
HCT: 37.8 % (ref 34.8–46.6)
HGB: 12.5 g/dL (ref 11.6–15.9)
LYMPH%: 34.6 % (ref 14.0–49.7)
MCH: 31 pg (ref 25.1–34.0)
MCHC: 33.1 g/dL (ref 31.5–36.0)
MCV: 93.6 fL (ref 79.5–101.0)
MONO#: 0.4 10*3/uL (ref 0.1–0.9)
MONO%: 10.1 % (ref 0.0–14.0)
NEUT#: 2.3 10*3/uL (ref 1.5–6.5)
NEUT%: 54.1 % (ref 38.4–76.8)
Platelets: 233 10*3/uL (ref 145–400)
RBC: 4.04 10*6/uL (ref 3.70–5.45)
RDW: 11.9 % (ref 11.2–14.5)
WBC: 4.3 10*3/uL (ref 3.9–10.3)
lymph#: 1.5 10*3/uL (ref 0.9–3.3)

## 2014-03-19 LAB — COMPREHENSIVE METABOLIC PANEL (CC13)
ALT: 15 U/L (ref 0–55)
AST: 15 U/L (ref 5–34)
Albumin: 3.8 g/dL (ref 3.5–5.0)
Alkaline Phosphatase: 52 U/L (ref 40–150)
Anion Gap: 7 mEq/L (ref 3–11)
BUN: 7.9 mg/dL (ref 7.0–26.0)
CO2: 25 mEq/L (ref 22–29)
Calcium: 9.4 mg/dL (ref 8.4–10.4)
Chloride: 106 mEq/L (ref 98–109)
Creatinine: 0.7 mg/dL (ref 0.6–1.1)
Glucose: 82 mg/dl (ref 70–140)
Potassium: 4.4 mEq/L (ref 3.5–5.1)
Sodium: 139 mEq/L (ref 136–145)
Total Bilirubin: 0.36 mg/dL (ref 0.20–1.20)
Total Protein: 7 g/dL (ref 6.4–8.3)

## 2014-03-19 MED ORDER — BUTALBITAL-APAP-CAFFEINE 50-325-40 MG PO TABS: 1.0000 | ORAL_TABLET | Freq: Four times a day (QID) | ORAL | Status: DC | PRN

## 2014-03-19 NOTE — Assessment & Plan Note (Signed)
Her last seizure was a week ago. Apparently, the gabapentin may have help with seizure. She felt that discontinuation of tramadol which also seemed to help. She has neurologist appointment pending.

## 2014-03-19 NOTE — Assessment & Plan Note (Signed)
Overall, she tolerated treatment well without any side effects. MRI showed no evidence of recurrence or progression. I would defer to her neurosurgeon for imaging studies. Once she is discharged from his service, I would recommend imaging study with MRI contrast of her brain every 3 months in the first year. She will begin cycle 2 on 03/31/2014. Since she is tolerating treatment very well, I will see her next month on October 12 prior to cycle 3 of treatment.

## 2014-03-19 NOTE — Telephone Encounter (Signed)
gv adn printed appt sched and avs for pt for OCT °

## 2014-03-19 NOTE — Assessment & Plan Note (Signed)
I am attempting to wean it off Dilaudid. I gave her prescription for Fioricet to use as needed.

## 2014-03-19 NOTE — Progress Notes (Signed)
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   09/27/2013 Pathology Results Accession: [id number] pathology showed grade 2 oligodendroglioma with mutant IDH1 protein, ATRX, OLIG 2 expression. p53 is negative, low Ki-67 labeling index. 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   11/06/2013 Imaging Repeat CT scan of the head showed resolution of abscess.   01/23/2014 Surgery Dr. Christella Noa perform a subtotal gross resection of the tumor.   01/24/2014 Imaging Repeat imaging studies showed persistent and residual disease.   03/03/2014 -  Chemotherapy She was started on Temodar, 5 days every 28 days   03/13/2014 Imaging Repeat MRI showed no evidence of progression of disease.    INTERVAL HISTORY: Please see below for problem oriented charting. She is seen for toxicity review after cycle 1 of treatment. She tolerated chemotherapy well. She felt that gabapentin increased sedation. The frequency of seizures is reduced. She had mild incisional infection recently, resolved with antibiotics treatment. She has persistent and frequent headaches which she describes as pressure behind her right eye. She also had pain in her neck. She complained of altered sensation of her left side of the tongue and buccal mucosa REVIEW OF SYSTEMS:   Constitutional: Denies fevers, chills or abnormal weight loss Eyes: Denies blurriness of vision Ears, nose, mouth, throat, and face: Denies mucositis or sore throat Respiratory: Denies cough, dyspnea or wheezes Cardiovascular: Denies palpitation, chest discomfort  or lower extremity swelling Gastrointestinal:  Denies nausea, heartburn or change in bowel habits Skin: Denies abnormal skin rashes Lymphatics: Denies new lymphadenopathy or easy bruising Neurological:Denies numbness, tingling or new weaknesses Behavioral/Psych: Mood is stable, no new changes  All other systems were reviewed with the patient and are negative.  I have reviewed the past medical history, past surgical history, social history and family history with the patient and they are unchanged from previous note.  ALLERGIES:  is allergic to adhesive; latex; percocet; shellfish allergy; soma; and vancomycin.  MEDICATIONS:  Current Outpatient Prescriptions  Medication Sig Dispense Refill  . ALPRAZolam (XANAX) 1 MG tablet Take 1 mg by mouth 2 (two) times daily as needed (for seizures).       . carisoprodol (SOMA) 350 MG tablet Take 350 mg by mouth 4 (four) times daily. Takes with Keppra for seizure prevention.      . gabapentin (NEURONTIN) 300 MG capsule Take 300 mg by mouth 2 (two) times daily.      Marland Kitchen HYDROmorphone (DILAUDID) 4 MG tablet Take 1 tablet (4 mg total) by mouth every 6 (six) hours as needed for severe pain.  90 tablet  0  . levETIRAcetam (KEPPRA) 500 MG tablet Take 500 mg by mouth 4 (four) times daily. Takes with Soma for seizure prevention.      . temazepam (RESTORIL) 30 MG capsule Take 30 mg by mouth at bedtime as needed for sleep.      Marland Kitchen albuterol (PROVENTIL HFA;VENTOLIN HFA) 108 (90 BASE) MCG/ACT inhaler Inhale 2 puffs into the lungs every 6 (six) hours as needed for wheezing or shortness of breath.       . butalbital-acetaminophen-caffeine (FIORICET) 50-325-40 MG per tablet Take 1 tablet by mouth  every 6 (six) hours as needed for headache.  60 tablet  0  . ondansetron (ZOFRAN) 8 MG tablet Take 1 tablet (8 mg total) by mouth every 8 (eight) hours as needed for nausea or vomiting.  30 tablet  3  . temozolomide (TEMODAR) 180 MG capsule Take 180 mg by mouth See admin  instructions. Takes 2 capsules daily for 5 days, then stops for 23 days, then repeats.  May take on an empty stomach or at bedtime to decrease nausea & vomiting.       No current facility-administered medications for this visit.    PHYSICAL EXAMINATION: ECOG PERFORMANCE STATUS: 1 - Symptomatic but completely ambulatory  Filed Vitals:   03/19/14 0935  BP: 119/74  Pulse: 78  Temp: 98.4 F (36.9 C)  Resp: 24   Filed Weights   03/19/14 0935  Weight: 154 lb 8 oz (70.081 kg)    GENERAL:alert, no distress and comfortable SKIN: skin color, texture, turgor are normal, no rashes or significant lesions. Well-healed surgical scar on the right skull EYES: She has persistent hematoma on the sclera of her right eye OROPHARYNX:no exudate, no erythema and lips, buccal mucosa, and tongue normal  Musculoskeletal:no cyanosis of digits and no clubbing  NEURO: alert & oriented x 3 with fluent speech, with mild left facial droop  LABORATORY DATA:  I have reviewed the data as listed    Component Value Date/Time   NA 139 03/19/2014 0918   NA 138 01/27/2014 0611   K 4.4 03/19/2014 0918   K 4.4 01/27/2014 0611   CL 99 01/27/2014 0611   CO2 25 03/19/2014 0918   CO2 26 01/27/2014 0611   GLUCOSE 82 03/19/2014 0918   GLUCOSE 115* 01/27/2014 0611   BUN 7.9 03/19/2014 0918   BUN 8 01/27/2014 0611   CREATININE 0.7 03/19/2014 0918   CREATININE 0.48* 01/27/2014 0611   CALCIUM 9.4 03/19/2014 0918   CALCIUM 9.0 01/27/2014 0611   PROT 7.0 03/19/2014 0918   PROT 6.9 09/20/2013 0455   ALBUMIN 3.8 03/19/2014 0918   ALBUMIN 3.9 09/20/2013 0455   AST 15 03/19/2014 0918   AST 12 09/20/2013 0455   ALT 15 03/19/2014 0918   ALT 8 09/20/2013 0455   ALKPHOS 52 03/19/2014 0918   ALKPHOS 40 09/20/2013 0455   BILITOT 0.36 03/19/2014 0918   BILITOT 0.7 09/20/2013 0455   GFRNONAA >90 01/27/2014 0611   GFRAA >90 01/27/2014 0611    No results found for this basename: SPEP, UPEP,  kappa and lambda light chains    Lab Results  Component Value Date   WBC  4.3 03/19/2014   NEUTROABS 2.3 03/19/2014   HGB 12.5 03/19/2014   HCT 37.8 03/19/2014   MCV 93.6 03/19/2014   PLT 233 03/19/2014      Chemistry      Component Value Date/Time   NA 139 03/19/2014 0918   NA 138 01/27/2014 0611   K 4.4 03/19/2014 0918   K 4.4 01/27/2014 0611   CL 99 01/27/2014 0611   CO2 25 03/19/2014 0918   CO2 26 01/27/2014 0611   BUN 7.9 03/19/2014 0918   BUN 8 01/27/2014 0611   CREATININE 0.7 03/19/2014 0918   CREATININE 0.48* 01/27/2014 0611      Component Value Date/Time   CALCIUM 9.4 03/19/2014 0918   CALCIUM 9.0 01/27/2014 0611   ALKPHOS 52 03/19/2014 0918   ALKPHOS 40 09/20/2013 0455   AST 15 03/19/2014 0918   AST 12 09/20/2013 0455  ALT 15 03/19/2014 0918   ALT 8 09/20/2013 0455   BILITOT 0.36 03/19/2014 0918   BILITOT 0.7 09/20/2013 0455    I reviewed the MRI with the patient. 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 MRI showed no evidence of recurrence or progression. I would defer to her neurosurgeon for imaging studies. Once she is discharged from his service, I would recommend imaging study with MRI contrast of her brain every 3 months in the first year. She will begin cycle 2 on 03/31/2014. Since she is tolerating treatment very well, I will see her next month on October 12 prior to cycle 3 of treatment.  Seizures Her last seizure was a week ago. Apparently, the gabapentin may have help with seizure. She felt that discontinuation of tramadol which also seemed to help. She has neurologist appointment pending.  Headache I am attempting to wean it off Dilaudid. I gave her prescription for Fioricet to use as needed.   No orders of the defined types were placed in this encounter.   All questions were answered. The patient knows to call the clinic with any problems, questions or concerns. No barriers to learning was detected. I spent 30  minutes counseling the patient face to face. The total time spent in the appointment was 40 minutes and more than 50% was on counseling and review of test results     [facility], [place], MD 03/19/2014 10:40 AM

## 2014-03-20 ENCOUNTER — Ambulatory Visit (INDEPENDENT_AMBULATORY_CARE_PROVIDER_SITE_OTHER): Payer: Medicaid Other | Admitting: Neurology

## 2014-03-20 ENCOUNTER — Encounter: Payer: Self-pay | Admitting: Neurology

## 2014-03-20 VITALS — BP 117/82 | HR 92 | Temp 98.1°F | Ht 68.5 in | Wt 153.4 lb

## 2014-03-20 DIAGNOSIS — G4089 Other seizures: Secondary | ICD-10-CM

## 2014-03-20 DIAGNOSIS — R569 Unspecified convulsions: Secondary | ICD-10-CM

## 2014-03-20 DIAGNOSIS — G40109 Localization-related (focal) (partial) symptomatic epilepsy and epileptic syndromes with simple partial seizures, not intractable, without status epilepticus: Secondary | ICD-10-CM | POA: Insufficient documentation

## 2014-03-20 MED ORDER — CITALOPRAM HYDROBROMIDE 10 MG PO TABS: 10.0000 mg | ORAL_TABLET | Freq: Every day | ORAL | Status: DC

## 2014-03-20 MED ORDER — LEVETIRACETAM 500 MG PO TABS: 500.0000 mg | ORAL_TABLET | Freq: Four times a day (QID) | ORAL | Status: DC

## 2014-03-20 MED ORDER — DIVALPROEX SODIUM ER 500 MG PO TB24: 500.0000 mg | ORAL_TABLET | Freq: Every day | ORAL | Status: DC

## 2014-03-20 NOTE — Progress Notes (Signed)
[facility] [address] [place] [address] [phone]       OFFICE CONSULT NOTE  Victoria. Victoria Bates Date of Birth:  09/22/1980 Medical Record Number:  [id number]   Referring MD:  Christella Noa  Reason for Referral:  Seizures  HPI: Victoria Bates is a 74 year Caucasian lady who was diagnosed with right frontal brain tumor in March 2015 for which she initially underwent a biopsy followed by subtotal resection on 01/23/14 by Dr. Cyndy Freeze with pathology showing grade 2/4 oligodendroglioma. She's undergone radiation course and is currently on chemotherapy being followed by oncologist Dr.Ni Alvy Bimler. She was started on Keppra initially 500 mg twice daily for presumed seizure prophylaxis but states she has multiple episodes in which she's had tremulousness of her arms as well as palpitations with heart rate racing up to 200 as well as numbness on the face with drooping on the right side. She calls these episodes seizures but review of her chart does not reveal any neurological consultation in the hospital or EEG study. Keppra dose has been increased to 2 gram daily by  Dr. Cyndy Freeze but patient feels she has not had significant benefit  in fact she states that her personality is changing she has become more mean. She was also on a lot of pain medications and after Ultram was stopped 3 weeks ago she has been feeling slightly better. She has had some isolated episodes of left face and arm paresthesias which have not lasted long. She denies any episodes history of focal twitching since moving the left face arm or loss of consciousness or tonic-clonic activity or tongue bite. She does have a preceding history of long-standing anxiety/panic symptoms since last to 3 years. Some of the symptoms may have gotten worse in the last few months. She was tried on Zoloft in the past for her anxiety and panic blood did not respond well to it. She currently takes Xanax 1 mg on a when necessary basis only. She has  not had an EEG her documented seizure. She is currently taking Keppra 500 mg 4 times daily. She has no prior history of seizures, significant head injury with loss of consciousness or epilepsy. Followup MRI scan of the brain on 03/13/2014 personally reviewed by me shows postoperative surgical changes in the right frontal lobe with resolving blood products in the tumor cavity.  ROS:   14 system review of systems is positive for fatigue, spinning sensation, aching muscle, memory loss, confusion, headache, numbness, slurred speech, seizure, racing thoughts, aching muscles, not enough sleep and all other systems negative  PMH:  Past Medical History  Diagnosis Date  . Liver hemangioma   . Anemia   . Headache(784.0)     migraines  . Anxiety   . Asthma     very mild- no inhaler use since July 2013  . Complication of anesthesia     takes longer for patient to go under anesthesia  . Brain tumor     Hx: of  . Infection     to right head incision  . Petit mal seizure status   . Dizziness   . Seizures     petit mal serizures most days  . Cancer 2003    skin  . Herniated disc   . Nausea alone 02/12/2014  . Dysphasia 02/12/2014  . Headache 02/12/2014    Social History:  History   Social History  . Marital Status: Single    Spouse Name: N/A    Number  of Children: 2  . Years of Education: College   Occupational History  . laser tech    Social History Main Topics  . Smoking status: Never Smoker   . Smokeless tobacco: Never Used  . Alcohol Use: No  . Drug Use: No  . Sexual Activity: Yes    Birth Control/ Protection: None   Other Topics Concern  . Not on file   Social History Narrative   Patient lives at home with her two children.   Caffeine use: minimum of 4 cups daily    Medications:   Current Outpatient Prescriptions on File Prior to Visit  Medication Sig Dispense Refill  . ALPRAZolam (XANAX) 1 MG tablet Take 1 mg by mouth 2 (two) times daily as needed (for seizures).        . butalbital-acetaminophen-caffeine (FIORICET) 50-325-40 MG per tablet Take 1 tablet by mouth every 6 (six) hours as needed for headache.  60 tablet  0  . carisoprodol (SOMA) 350 MG tablet Take 350 mg by mouth 4 (four) times daily. Takes with Keppra for seizure prevention.      . gabapentin (NEURONTIN) 300 MG capsule Take 300 mg by mouth 3 (three) times daily.       Marland Kitchen HYDROmorphone (DILAUDID) 4 MG tablet Take 1 tablet (4 mg total) by mouth every 6 (six) hours as needed for severe pain.  90 tablet  0  . ondansetron (ZOFRAN) 8 MG tablet Take 1 tablet (8 mg total) by mouth every 8 (eight) hours as needed for nausea or vomiting.  30 tablet  3  . temazepam (RESTORIL) 30 MG capsule Take 30 mg by mouth at bedtime as needed for sleep.      Marland Kitchen temozolomide (TEMODAR) 180 MG capsule Take 180 mg by mouth See admin instructions. Takes 2 capsules daily for 5 days, then stops for 23 days, then repeats.  May take on an empty stomach or at bedtime to decrease nausea & vomiting.      Marland Kitchen albuterol (PROVENTIL HFA;VENTOLIN HFA) 108 (90 BASE) MCG/ACT inhaler Inhale 2 puffs into the lungs every 6 (six) hours as needed for wheezing or shortness of breath.        No current facility-administered medications on file prior to visit.    Allergies:   Allergies  Allergen Reactions  . Adhesive [Tape] Other (See Comments)    Skin tear  . Latex Hives  . Percocet [Oxycodone-Acetaminophen] Other (See Comments)    makes pt angry  . Shellfish Allergy Diarrhea and Nausea And Vomiting  . Soma [Carisoprodol]     Watson brand, specifically, causes disorientation (can take other brands)  . Vancomycin Hives    Physical Exam General: well developed, well nourished young Caucasian lady, seated, in no evident distress Head: head normocephalic and atraumatic. Orohparynx benign Neck: supple with no carotid or supraclavicular bruits Cardiovascular: regular rate and rhythm, no murmurs Musculoskeletal: Right temporal deformity from  craniotomy and alopecia Skin:  no rash/petichiae Vascular:  Normal pulses all extremities Filed Vitals:   03/20/14 0901  BP: 117/82  Pulse: 92  Temp: 98.1 F (36.7 C)    Neurologic Exam Mental Status: Awake and fully alert. Oriented to place and time. Recent and remote memory intact. Attention span, concentration and fund of knowledge appropriate. Mood and affect labile and gets frustated with her situation.  Cranial Nerves: Fundoscopic exam reveals sharp disc margins. Pupils equal, briskly reactive to light. Extraocular movements full without nystagmus. Visual fields full to confrontation. Hearing intact. Facial sensation intact.  Face, tongue, palate moves normally and symmetrically.  Motor: Normal bulk and tone. Normal strength in all tested extremity muscles. Sensory.: Diminished touch and pin prick on the left face, neck and arm in a patchy and inconsistent pattern . Normal vibration and position sense Coordination: Rapid alternating movements normal in all extremities. Finger-to-nose and heel-to-shin performed accurately bilaterally. Gait and Station: Arises from chair without difficulty. Stance is normal. Gait demonstrates normal stride length and balance . Able to heel, toe and tandem walk without difficulty.  Reflexes: 1+ and symmetric. Toes downgoing.       ASSESSMENT: 89 year Caucasian  lady with a right frontal oligodendroglioma status post subtotal resection in July 2015 followed by radiation and currently on chemotherapy who has had recurrent episodes of tremulousness, palpitations and the left face and body paresthesias possible anxiety/panic episodes though some of these episodes involving the left face and arm paresthesias may raise suspicion for simple partial sensory seizures. No definite history to suggest focal motor or generalized seizures. Patient has had some personality changes and worsening anxiety panic symptoms on Keppra    PLAN: I had a long discussion with  the patient regarding her episodes of tremors, palpitations and numbness possibly being anxiety panic episodes. She has in addition also had some episodes of transient left face and arm numbness which raise the possibility of sensory seizures. She has had a personality change on Keppra hence I recommend tapering and discontinuing it slowly over the next 3 weeks. Switch to Depakote ER 500 once a day for both mood stabilization as well as seizure prophylaxis. Start Celexa 10 mg daily as well for anxiety/panic. Use Xanax as needed 0.5 mg twice daily. Check EEG for seizure activity. Continue followup with oncologist for ongoing chemotherapy and followup brain imaging as per neurosurgeon Dr. Cyndy Freeze. Return for followup in 2 months or call earlier if necessary.   Note: This document was prepared with digital dictation and possible smart phrase technology. Any transcriptional errors that result from this process are unintentional.

## 2014-03-20 NOTE — Patient Instructions (Addendum)
I had a long discussion with the patient regarding her episodes of tremors, palpitations and numbness possibly being anxiety panic episodes. She has in addition also had some episodes of transient left face and arm numbness which raise the possibility of sensory seizures. She has had a personality change on Keppra hence I recommend tapering and discontinuing it slowly over the next 3 weeks. Switch to Depakote ER 500 once a day for both mood stabilization as well as seizure prophylaxis. Start Celexa 10 mg daily as well for anxiety/panic. Use Xanax as needed 0.5 mg twice daily. Check EEG for seizure activity. Continue followup with oncologist for ongoing chemotherapy and followup brain imaging as per neurosurgeon Dr. Cyndy Freeze. Return for followup in 2 months or call earlier if necessary.

## 2014-03-27 ENCOUNTER — Telehealth: Payer: Self-pay | Admitting: *Deleted

## 2014-03-27 NOTE — Telephone Encounter (Signed)
Pt called states has low grade fever with temp 99.4 F. She has had a headache x 3 days and reports the incision on her scalp is very red, inflamed and tender.  She overall not feeling well.  She asks if she should re-start her Temodar on Monday 9/14?  She also concerned if she might have infection in her incision.  States she has left two messages for Dr. Hewitt Shorts office but they have not called her back yet.  She wonders if she needs to be on another round of antibiotic?   Informed pt this needs to be addressed by Dr. Christella Noa and will call his office to see if I can help.  I will send Dr. Alvy Bimler message about whether pt should re start temodar on Monday as scheduled.  She verbalized understanding.  I called Dr. Lacy Duverney office and was put on phone w/ Dr. Christella Noa.  He said he would take care of it and nothing further for this RN to do.

## 2014-03-28 ENCOUNTER — Other Ambulatory Visit (INDEPENDENT_AMBULATORY_CARE_PROVIDER_SITE_OTHER): Payer: Medicaid Other

## 2014-03-28 DIAGNOSIS — R569 Unspecified convulsions: Secondary | ICD-10-CM

## 2014-03-28 DIAGNOSIS — G40109 Localization-related (focal) (partial) symptomatic epilepsy and epileptic syndromes with simple partial seizures, not intractable, without status epilepticus: Secondary | ICD-10-CM

## 2014-03-28 NOTE — Telephone Encounter (Signed)
Per Dr. Alvy Bimler,  Pt to call us on Monday before she resumes Temodar to see how she is doing.  Instructed pt to call us Monday before taking Temodar.  Pt verbalized understanding.  She states Dr. Christella Noa has prescribed another round of antibiotic.

## 2014-03-31 ENCOUNTER — Telehealth: Payer: Self-pay | Admitting: *Deleted

## 2014-03-31 NOTE — Telephone Encounter (Signed)
Left VM for pt requesting she call nurse back to check on her condition today.

## 2014-03-31 NOTE — Telephone Encounter (Signed)
Called pt again.  She states she is feeling better today.  Dr. Christella Noa did start her on Keflex again.  She does not have fever today.  States her incision looks about the same, but less tender.  Informed Dr. Alvy Bimler and she instructs for pt to start her cycle of Temodar today as scheduled.   Pt verbalized understanding.

## 2014-04-01 ENCOUNTER — Telehealth: Payer: Self-pay | Admitting: *Deleted

## 2014-04-01 ENCOUNTER — Telehealth: Payer: Self-pay | Admitting: Neurology

## 2014-04-01 NOTE — Telephone Encounter (Signed)
Pt left VM states started Temodar last night and it has "knocked me on my butt."  She c/o increased headache.  I called pt back and left VM for her to please try to call nurse back again for more information.

## 2014-04-01 NOTE — Telephone Encounter (Signed)
Patient calling for results for EEG.  Please call anytime and may leave detailed message on vm.

## 2014-04-01 NOTE — Telephone Encounter (Signed)
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  States some of it may be from possible infection as she reports incision still red and tender.  States part of it is worse from vomiting due to the Temodar.  She only took zofran once last night before the temodar and again this morning.  Instructed pt to take Zofran TID every day while on the temodar x 5 days.  Call us back if this does not control the nausea.  Pt reports the Fioricet does not help headache at all.  Dilaudid does help and she needs a refill.  She can send her husband to pick up the Rx tomorrow.  She has a few pills left.   I did explain she can wait a week or two to restart her Temodar if she feels this is exacerbating the pain.  She says she would rather continue the Temodar with taking the zofran TID and the dilaudid prn.  She expressed some concern about narcotic pain medication and long term dependence.  Discussed using only as prescribed for acute pain.  She verbalized understanding.  She states still does not have appt to see Dr. Christella Noa but she was told he would see her this week.  She is waiting for return call.

## 2014-04-02 ENCOUNTER — Other Ambulatory Visit: Payer: Self-pay | Admitting: Hematology and Oncology

## 2014-04-02 DIAGNOSIS — C711 Malignant neoplasm of frontal lobe: Secondary | ICD-10-CM

## 2014-04-02 MED ORDER — HYDROMORPHONE HCL 4 MG PO TABS: 4.0000 mg | ORAL_TABLET | Freq: Four times a day (QID) | ORAL | Status: DC | PRN

## 2014-04-02 NOTE — Telephone Encounter (Signed)
Called patient and let her know her results has not been dictated yet but i will have the docotor read them today and  will give her a call in the morning with results. Patient showed understanding.

## 2014-04-03 ENCOUNTER — Telehealth: Payer: Self-pay | Admitting: Neurology

## 2014-04-08 ENCOUNTER — Other Ambulatory Visit: Payer: Self-pay | Admitting: Hematology and Oncology

## 2014-04-08 ENCOUNTER — Encounter: Payer: Self-pay | Admitting: Hematology and Oncology

## 2014-04-08 ENCOUNTER — Telehealth: Payer: Self-pay | Admitting: *Deleted

## 2014-04-08 DIAGNOSIS — M792 Neuralgia and neuritis, unspecified: Secondary | ICD-10-CM | POA: Insufficient documentation

## 2014-04-08 DIAGNOSIS — G44011 Episodic cluster headache, intractable: Secondary | ICD-10-CM

## 2014-04-08 HISTORY — DX: Neuralgia and neuritis, unspecified: M79.2

## 2014-04-08 MED ORDER — GABAPENTIN 300 MG PO CAPS: 300.0000 mg | ORAL_CAPSULE | Freq: Three times a day (TID) | ORAL | Status: DC

## 2014-04-08 MED ORDER — BUTALBITAL-APAP-CAFFEINE 50-325-40 MG PO TABS: 1.0000 | ORAL_TABLET | Freq: Four times a day (QID) | ORAL | Status: DC | PRN

## 2014-04-08 NOTE — Telephone Encounter (Signed)
done

## 2014-04-08 NOTE — Telephone Encounter (Signed)
Informed pt of refill on Fioricet called into [facility].  Pt states did not request this refill, it was probably generated by [facility].  Pt says she does need a refill on her Gabapentin 300 mg TID sent to [facility] on [address].

## 2014-04-24 ENCOUNTER — Telehealth: Payer: Self-pay | Admitting: Neurology

## 2014-04-24 NOTE — Telephone Encounter (Signed)
Dr. Leonie Man  Please review note below and place order in the system.

## 2014-04-24 NOTE — Telephone Encounter (Signed)
Agree increase Depakote ER to 500mg  twice daily

## 2014-04-24 NOTE — Telephone Encounter (Signed)
Patient requesting that Depakote be written for twice daily every 12 hours as it is wearing off when she takes it the way it is written now. Best call back number is [phone]. It is okay to leave a detailed message at this number. She uses [facility] on [address].

## 2014-04-25 MED ORDER — DIVALPROEX SODIUM ER 500 MG PO TB24: 500.0000 mg | ORAL_TABLET | Freq: Two times a day (BID) | ORAL | Status: DC

## 2014-04-25 NOTE — Telephone Encounter (Signed)
Rx has been updated and sent.  I called the patient back.  She is aware.   

## 2014-04-28 ENCOUNTER — Ambulatory Visit (HOSPITAL_BASED_OUTPATIENT_CLINIC_OR_DEPARTMENT_OTHER): Payer: Medicaid Other | Admitting: Hematology and Oncology

## 2014-04-28 ENCOUNTER — Encounter: Payer: Self-pay | Admitting: Hematology and Oncology

## 2014-04-28 ENCOUNTER — Ambulatory Visit (HOSPITAL_COMMUNITY)
Admission: RE | Admit: 2014-04-28 | Discharge: 2014-04-28 | Disposition: A | Discharge status: Home / Self Care | Payer: Medicaid Other | Source: Ambulatory Visit | Attending: Hematology and Oncology | Admitting: Hematology and Oncology

## 2014-04-28 ENCOUNTER — Encounter (HOSPITAL_COMMUNITY): Payer: Self-pay

## 2014-04-28 ENCOUNTER — Telehealth: Payer: Self-pay | Admitting: Hematology and Oncology

## 2014-04-28 ENCOUNTER — Other Ambulatory Visit (HOSPITAL_BASED_OUTPATIENT_CLINIC_OR_DEPARTMENT_OTHER): Payer: Medicaid Other

## 2014-04-28 VITALS — BP 136/92 | HR 92 | Temp 98.0°F | Resp 18 | Ht 68.5 in | Wt 153.4 lb

## 2014-04-28 DIAGNOSIS — Z9889 Other specified postprocedural states: Secondary | ICD-10-CM | POA: Diagnosis not present

## 2014-04-28 DIAGNOSIS — G44021 Chronic cluster headache, intractable: Secondary | ICD-10-CM

## 2014-04-28 DIAGNOSIS — D6959 Other secondary thrombocytopenia: Secondary | ICD-10-CM

## 2014-04-28 DIAGNOSIS — R519 Headache, unspecified: Secondary | ICD-10-CM

## 2014-04-28 DIAGNOSIS — C711 Malignant neoplasm of frontal lobe: Secondary | ICD-10-CM

## 2014-04-28 DIAGNOSIS — C719 Malignant neoplasm of brain, unspecified: Secondary | ICD-10-CM | POA: Insufficient documentation

## 2014-04-28 DIAGNOSIS — R202 Paresthesia of skin: Secondary | ICD-10-CM

## 2014-04-28 DIAGNOSIS — D696 Thrombocytopenia, unspecified: Secondary | ICD-10-CM | POA: Insufficient documentation

## 2014-04-28 DIAGNOSIS — G40109 Localization-related (focal) (partial) symptomatic epilepsy and epileptic syndromes with simple partial seizures, not intractable, without status epilepticus: Secondary | ICD-10-CM

## 2014-04-28 DIAGNOSIS — R51 Headache: Secondary | ICD-10-CM | POA: Insufficient documentation

## 2014-04-28 DIAGNOSIS — R110 Nausea: Secondary | ICD-10-CM

## 2014-04-28 DIAGNOSIS — R079 Chest pain, unspecified: Secondary | ICD-10-CM

## 2014-04-28 DIAGNOSIS — T50905A Adverse effect of unspecified drugs, medicaments and biological substances, initial encounter: Secondary | ICD-10-CM

## 2014-04-28 DIAGNOSIS — R4702 Dysphasia: Secondary | ICD-10-CM

## 2014-04-28 LAB — CBC WITH DIFFERENTIAL/PLATELET
BASO%: 0.4 % (ref 0.0–2.0)
Basophils Absolute: 0 10*3/uL (ref 0.0–0.1)
EOS%: 0.8 % (ref 0.0–7.0)
Eosinophils Absolute: 0 10*3/uL (ref 0.0–0.5)
HCT: 36.5 % (ref 34.8–46.6)
HGB: 12.4 g/dL (ref 11.6–15.9)
LYMPH%: 16.3 % (ref 14.0–49.7)
MCH: 31.4 pg (ref 25.1–34.0)
MCHC: 34 g/dL (ref 31.5–36.0)
MCV: 92.4 fL (ref 79.5–101.0)
MONO#: 0.4 10*3/uL (ref 0.1–0.9)
MONO%: 6.9 % (ref 0.0–14.0)
NEUT#: 4 10*3/uL (ref 1.5–6.5)
NEUT%: 75.6 % (ref 38.4–76.8)
Platelets: 115 10*3/uL — ABNORMAL LOW (ref 145–400)
RBC: 3.95 10*6/uL (ref 3.70–5.45)
RDW: 12.2 % (ref 11.2–14.5)
WBC: 5.2 10*3/uL (ref 3.9–10.3)
lymph#: 0.9 10*3/uL (ref 0.9–3.3)
nRBC: 1 % — ABNORMAL HIGH (ref 0–0)

## 2014-04-28 LAB — COMPREHENSIVE METABOLIC PANEL (CC13)
ALT: 14 U/L (ref 0–55)
AST: 12 U/L (ref 5–34)
Albumin: 3.8 g/dL (ref 3.5–5.0)
Alkaline Phosphatase: 54 U/L (ref 40–150)
Anion Gap: 9 mEq/L (ref 3–11)
BUN: 7.3 mg/dL (ref 7.0–26.0)
CO2: 28 mEq/L (ref 22–29)
Calcium: 9.3 mg/dL (ref 8.4–10.4)
Chloride: 106 mEq/L (ref 98–109)
Creatinine: 0.7 mg/dL (ref 0.6–1.1)
Glucose: 94 mg/dl (ref 70–140)
Potassium: 4.8 mEq/L (ref 3.5–5.1)
Sodium: 143 mEq/L (ref 136–145)
Total Bilirubin: 0.28 mg/dL (ref 0.20–1.20)
Total Protein: 7 g/dL (ref 6.4–8.3)

## 2014-04-28 MED ORDER — DEXAMETHASONE 4 MG PO TABS: 4.0000 mg | ORAL_TABLET | Freq: Two times a day (BID) | ORAL | Status: DC

## 2014-04-28 MED ORDER — MORPHINE SULFATE ER 30 MG PO TBCR: 30.0000 mg | EXTENDED_RELEASE_TABLET | Freq: Two times a day (BID) | ORAL | Status: DC

## 2014-04-28 MED ORDER — HYDROMORPHONE HCL 8 MG PO TABS: 8.0000 mg | ORAL_TABLET | Freq: Four times a day (QID) | ORAL | Status: DC | PRN

## 2014-04-28 NOTE — Assessment & Plan Note (Signed)
She has persistent seizures and her most recent seizure medications were changed. I recommend she continues her seizure medication as directed by her neurologist. I will order CT scan for evaluation.

## 2014-04-28 NOTE — Progress Notes (Signed)
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   09/27/2013 Pathology Results Accession: [id number] pathology showed grade 2 oligodendroglioma with mutant IDH1 protein, ATRX, OLIG 2 expression. p53 is negative, low Ki-67 labeling index. 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   11/06/2013 Imaging Repeat CT scan of the head showed resolution of abscess.   01/23/2014 Surgery Dr. Franky Macho perform a subtotal gross resection of the tumor.   01/24/2014 Imaging Repeat imaging studies showed persistent and residual disease.   03/03/2014 -  Chemotherapy She was started on Temodar, 5 days every 28 days   03/13/2014 Imaging Repeat MRI showed no evidence of progression of disease.    INTERVAL HISTORY: Please see below for problem oriented charting. Her recent antiseizure medication were changed. She had one seizure activity on 04/24/2014 and injure her head when she had the seizure. Since then, she has severe, uncontrolled headache behind her right eye which she described as pressure. She denies recent wound dehiscence from prior surgery. She is crying in my office due to uncontrolled, right-sided headache and pain. REVIEW OF SYSTEMS:   Constitutional: Denies fevers, chills or abnormal weight loss Eyes: Denies blurriness of vision Ears, nose, mouth, throat, and face: Denies mucositis or sore throat Respiratory: Denies cough, dyspnea or wheezes Cardiovascular: Denies palpitation, chest discomfort or lower extremity swelling Gastrointestinal:  Denies nausea, heartburn or change in bowel  habits Skin: Denies abnormal skin rashes Lymphatics: Denies new lymphadenopathy or easy bruising Neurological:Denies numbness, tingling or new weaknesses Behavioral/Psych: Mood is stable, no new changes  All other systems were reviewed with the patient and are negative.  I have reviewed the past medical history, past surgical history, social history and family history with the patient and they are unchanged from previous note.  ALLERGIES:  is allergic to adhesive; latex; percocet; shellfish allergy; soma; and vancomycin.  MEDICATIONS:  Current Outpatient Prescriptions  Medication Sig Dispense Refill  . albuterol (PROVENTIL HFA;VENTOLIN HFA) 108 (90 BASE) MCG/ACT inhaler Inhale 2 puffs into the lungs every 6 (six) hours as needed for wheezing or shortness of breath.       . ALPRAZolam (XANAX) 1 MG tablet Take 1 mg by mouth 2 (two) times daily as needed (for seizures).       . butalbital-acetaminophen-caffeine (FIORICET) 50-325-40 MG per tablet Take 1 tablet by mouth every 6 (six) hours as needed for headache.  90 tablet  0  . carisoprodol (SOMA) 350 MG tablet Take 350 mg by mouth 4 (four) times daily. Takes with Keppra for seizure prevention.      . divalproex (DEPAKOTE ER) 500 MG 24 hr tablet Take 1 tablet (500 mg total) by mouth 2 (two) times daily.  60 tablet  1  . gabapentin (NEURONTIN) 300 MG capsule Take 1 capsule (300 mg total) by mouth 3 (three) times daily.  270 capsule  3  . HYDROmorphone (DILAUDID) 8 MG tablet Take 1 tablet (8 mg total) by mouth every 6 (six) hours as needed for severe pain.  90 tablet  0  . ondansetron (ZOFRAN) 8 MG tablet Take 1 tablet (8 mg total) by mouth every 8 (eight) hours  as needed for nausea or vomiting.  30 tablet  3  . temazepam (RESTORIL) 30 MG capsule Take 30 mg by mouth at bedtime as needed for sleep.      Marland Kitchen temozolomide (TEMODAR) 180 MG capsule Take 180 mg by mouth See admin instructions. Takes 2 capsules daily for 5 days, then stops for 23 days, then  repeats.  May take on an empty stomach or at bedtime to decrease nausea & vomiting.      . citalopram (CELEXA) 10 MG tablet Take 1 tablet (10 mg total) by mouth daily. Take one tablet daily x 4 weeks then two tablets daily  60 tablet  1  . dexamethasone (DECADRON) 4 MG tablet Take 1 tablet (4 mg total) by mouth 2 (two) times daily with a meal.  30 tablet  0  . levETIRAcetam (KEPPRA) 500 MG tablet Take 1 tablet (500 mg total) by mouth 4 (four) times daily. Taper Keppra to 500 mg 3 times daily for one week followed by twice daily for one week then once a day for one week and discontinue.  1 tablet  1  . morphine (MS CONTIN) 30 MG 12 hr tablet Take 1 tablet (30 mg total) by mouth every 12 (twelve) hours.  30 tablet  0   No current facility-administered medications for this visit.    PHYSICAL EXAMINATION: ECOG PERFORMANCE STATUS: 1 - Symptomatic but completely ambulatory  Filed Vitals:   04/28/14 1228  BP: 136/92  Pulse: 92  Temp: 98 F (36.7 C)  Resp: 18   Filed Weights   04/28/14 1228  Weight: 153 lb 6.4 oz (69.582 kg)    GENERAL:alert, no distress and comfortable SKIN: skin color, texture, turgor are normal, no rashes or significant lesions. Well-healed surgical scar EYES: normal, Conjunctiva are pink and non-injected, sclera clear OROPHARYNX:no exudate, no erythema and lips, buccal mucosa, and tongue normal  NECK: supple, thyroid normal size, non-tender, without nodularity LYMPH:  no palpable lymphadenopathy in the cervical, axillary or inguinal LUNGS: clear to auscultation and percussion with normal breathing effort HEART: regular rate & rhythm and no murmurs and no lower extremity edema ABDOMEN:abdomen soft, non-tender and normal bowel sounds Musculoskeletal:no cyanosis of digits and no clubbing  NEURO: alert & oriented x 3 with fluent speech, no focal motor/sensory deficits  LABORATORY DATA:  I have reviewed the data as listed    Component Value Date/Time   NA 143  04/28/2014 1146   NA 138 01/27/2014 0611   K 4.8 04/28/2014 1146   K 4.4 01/27/2014 0611   CL 99 01/27/2014 0611   CO2 28 04/28/2014 1146   CO2 26 01/27/2014 0611   GLUCOSE 94 04/28/2014 1146   GLUCOSE 115* 01/27/2014 0611   BUN 7.3 04/28/2014 1146   BUN 8 01/27/2014 0611   CREATININE 0.7 04/28/2014 1146   CREATININE 0.48* 01/27/2014 0611   CALCIUM 9.3 04/28/2014 1146   CALCIUM 9.0 01/27/2014 0611   PROT 7.0 04/28/2014 1146   PROT 6.9 09/20/2013 0455   ALBUMIN 3.8 04/28/2014 1146   ALBUMIN 3.9 09/20/2013 0455   AST 12 04/28/2014 1146   AST 12 09/20/2013 0455   ALT 14 04/28/2014 1146   ALT 8 09/20/2013 0455   ALKPHOS 54 04/28/2014 1146   ALKPHOS 40 09/20/2013 0455   BILITOT 0.28 04/28/2014 1146   BILITOT 0.7 09/20/2013 0455   GFRNONAA >90 01/27/2014 0611   GFRAA >90 01/27/2014 0611    No results found for this basename: SPEP, UPEP,  kappa and  lambda light chains    Lab Results  Component Value Date   WBC 5.2 04/28/2014   NEUTROABS 4.0 04/28/2014   HGB 12.4 04/28/2014   HCT 36.5 04/28/2014   MCV 92.4 04/28/2014   PLT 115* 04/28/2014      Chemistry      Component Value Date/Time   NA 143 04/28/2014 1146   NA 138 01/27/2014 0611   K 4.8 04/28/2014 1146   K 4.4 01/27/2014 0611   CL 99 01/27/2014 0611   CO2 28 04/28/2014 1146   CO2 26 01/27/2014 0611   BUN 7.3 04/28/2014 1146   BUN 8 01/27/2014 0611   CREATININE 0.7 04/28/2014 1146   CREATININE 0.48* 01/27/2014 0611      Component Value Date/Time   CALCIUM 9.3 04/28/2014 1146   CALCIUM 9.0 01/27/2014 0611   ALKPHOS 54 04/28/2014 1146   ALKPHOS 40 09/20/2013 0455   AST 12 04/28/2014 1146   AST 12 09/20/2013 0455   ALT 14 04/28/2014 1146   ALT 8 09/20/2013 0455   BILITOT 0.28 04/28/2014 1146   BILITOT 0.7 09/20/2013 0455      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 I recommend  CT imaging to exclude bleeding as a cause of this. In the meantime, I will continue chemotherapy without dose adjustment.  Intractable chronic cluster headache She had recent injection by her PCP with slight improvement of headache. She requested dexamethasone to see if this would help. I recommend CT scan and give her a small prescription of dexamethasone. In the meantime, I recommend pain clinic consultation. I will increase her pain medication to 8 mg Dilaudid as needed and start her on MS Contin. I warned her about potential side effects of pain medication.  Simple partial seizures She has persistent seizures and her most recent seizure medications were changed. I recommend she continues her seizure medication as directed by her neurologist. I will order CT scan for evaluation.  Thrombocytopenia due to drugs This is likely due to recent treatment and her antiseizure medication.. The patient denies recent history of bleeding such as epistaxis, hematuria or hematochezia. She is asymptomatic from the low platelet count. I will observe for now.  she does not require transfusion now. I will continue the chemotherapy at current dose without dosage adjustment.  If the thrombocytopenia gets progressive worse in the future, I might have to delay her treatment or adjust the chemotherapy dose.     Orders Placed This Encounter  Procedures  . CT Head Wo Contrast    Standing Status: Future     Number of Occurrences:      Standing Expiration Date: 06/02/2015    Order Specific Question:  Reason for exam:    Answer:  severe headache, brain tumor, exclude bleed    Order Specific Question:  Preferred imaging location?    Answer:  [facility]  . Ambulatory referral to Genetics    Referral Priority:  Routine    Referral Type:  Consultation    Referral Reason:  Specialty Services Required    Number of Visits Requested:  1  . Ambulatory referral to Pain Clinic    Referral Priority:  Routine     Referral Type:  Consultation    Referral Reason:  Specialty Services Required    Requested Specialty:  Pain Medicine    Number of Visits Requested:  1   All questions were answered. The patient knows to call the clinic with any problems, questions or concerns. No barriers to learning was detected. I spent 30 minutes counseling the patient face to face. The total time spent in the appointment was 40 minutes and more than 50% was on counseling and review of test results     [facility], [place], MD 04/28/2014 3:39 PM

## 2014-04-28 NOTE — Telephone Encounter (Signed)
pain center referral change to [facility]. they will call the pt with apt

## 2014-04-28 NOTE — Telephone Encounter (Signed)
Gave AVS and future apt

## 2014-04-28 NOTE — Assessment & Plan Note (Signed)
She had recent injection by her PCP with slight improvement of headache. She requested dexamethasone to see if this would help. I recommend CT scan and give her a small prescription of dexamethasone. In the meantime, I recommend pain clinic consultation. I will increase her pain medication to 8 mg Dilaudid as needed and start her on MS Contin. I warned her about potential side effects of pain medication.

## 2014-04-28 NOTE — Assessment & Plan Note (Signed)
She tolerated chemotherapy well however has severe, uncontrolled headache. I recommend CT imaging to exclude bleeding as a cause of this. In the meantime, I will continue chemotherapy without dose adjustment.

## 2014-04-28 NOTE — Assessment & Plan Note (Signed)
This is likely due to recent treatment and her antiseizure medication.. The patient denies recent history of bleeding such as epistaxis, hematuria or hematochezia. She is asymptomatic from the low platelet count. I will observe for now.  she does not require transfusion now. I will continue the chemotherapy at current dose without dosage adjustment.  If the thrombocytopenia gets progressive worse in the future, I might have to delay her treatment or adjust the chemotherapy dose.

## 2014-04-29 ENCOUNTER — Telehealth: Payer: Self-pay | Admitting: *Deleted

## 2014-04-29 NOTE — Telephone Encounter (Signed)
Message copied by Patton Salles on Tue Apr 29, 2014  8:37 AM ------      Message from: [facility], [place]      Created: Tue Apr 29, 2014  7:42 AM      Regarding: Ct head       Please let her know there is no major abnormalities.      ----- Message -----         From: Rad Results In Interface         Sent: 04/28/2014   6:00 PM           To: Heath Lark, MD                   ------

## 2014-04-29 NOTE — Telephone Encounter (Signed)
Left message to call Dr Gorsuch office. 

## 2014-05-02 ENCOUNTER — Other Ambulatory Visit: Payer: Self-pay

## 2014-05-06 ENCOUNTER — Other Ambulatory Visit: Payer: Self-pay | Admitting: Hematology and Oncology

## 2014-05-06 ENCOUNTER — Telehealth: Payer: Self-pay | Admitting: *Deleted

## 2014-05-06 DIAGNOSIS — G44011 Episodic cluster headache, intractable: Secondary | ICD-10-CM

## 2014-05-06 MED ORDER — BUTALBITAL-APAP-CAFFEINE 50-325-40 MG PO TABS: 1.0000 | ORAL_TABLET | Freq: Four times a day (QID) | ORAL | Status: DC | PRN

## 2014-05-06 NOTE — Telephone Encounter (Signed)
Spoke with patient. Called in refill for fioricet. Pt states she will take dexamethasone 2mg  daily until finished if she gets another severe headache. She only took it for 3 days and it helped "drastically".

## 2014-05-12 ENCOUNTER — Telehealth: Payer: Self-pay | Admitting: Hematology and Oncology

## 2014-05-12 ENCOUNTER — Other Ambulatory Visit (HOSPITAL_BASED_OUTPATIENT_CLINIC_OR_DEPARTMENT_OTHER): Payer: Medicaid Other

## 2014-05-12 ENCOUNTER — Ambulatory Visit: Payer: Medicaid Other | Admitting: Genetic Counselor

## 2014-05-12 ENCOUNTER — Ambulatory Visit (HOSPITAL_BASED_OUTPATIENT_CLINIC_OR_DEPARTMENT_OTHER): Payer: Medicaid Other | Admitting: Hematology and Oncology

## 2014-05-12 ENCOUNTER — Encounter: Payer: Self-pay | Admitting: Genetic Counselor

## 2014-05-12 VITALS — BP 126/81 | HR 68 | Temp 98.0°F | Resp 18 | Ht 68.5 in | Wt 158.9 lb

## 2014-05-12 DIAGNOSIS — C711 Malignant neoplasm of frontal lobe: Secondary | ICD-10-CM

## 2014-05-12 DIAGNOSIS — Z803 Family history of malignant neoplasm of breast: Secondary | ICD-10-CM

## 2014-05-12 DIAGNOSIS — R4702 Dysphasia: Secondary | ICD-10-CM

## 2014-05-12 DIAGNOSIS — G4089 Other seizures: Secondary | ICD-10-CM

## 2014-05-12 DIAGNOSIS — R202 Paresthesia of skin: Secondary | ICD-10-CM

## 2014-05-12 DIAGNOSIS — R110 Nausea: Secondary | ICD-10-CM

## 2014-05-12 DIAGNOSIS — G44021 Chronic cluster headache, intractable: Secondary | ICD-10-CM

## 2014-05-12 DIAGNOSIS — R51 Headache: Secondary | ICD-10-CM

## 2014-05-12 DIAGNOSIS — G44011 Episodic cluster headache, intractable: Secondary | ICD-10-CM

## 2014-05-12 DIAGNOSIS — Z8041 Family history of malignant neoplasm of ovary: Secondary | ICD-10-CM

## 2014-05-12 DIAGNOSIS — Z8042 Family history of malignant neoplasm of prostate: Secondary | ICD-10-CM

## 2014-05-12 DIAGNOSIS — G40109 Localization-related (focal) (partial) symptomatic epilepsy and epileptic syndromes with simple partial seizures, not intractable, without status epilepticus: Secondary | ICD-10-CM

## 2014-05-12 DIAGNOSIS — R079 Chest pain, unspecified: Secondary | ICD-10-CM

## 2014-05-12 DIAGNOSIS — R519 Headache, unspecified: Secondary | ICD-10-CM

## 2014-05-12 LAB — COMPREHENSIVE METABOLIC PANEL (CC13)
ALT: 14 U/L (ref 0–55)
AST: 12 U/L (ref 5–34)
Albumin: 3.7 g/dL (ref 3.5–5.0)
Alkaline Phosphatase: 43 U/L (ref 40–150)
Anion Gap: 8 mEq/L (ref 3–11)
BUN: 6.3 mg/dL — ABNORMAL LOW (ref 7.0–26.0)
CO2: 25 mEq/L (ref 22–29)
Calcium: 9.2 mg/dL (ref 8.4–10.4)
Chloride: 108 mEq/L (ref 98–109)
Creatinine: 0.7 mg/dL (ref 0.6–1.1)
Glucose: 91 mg/dl (ref 70–140)
Potassium: 4.2 mEq/L (ref 3.5–5.1)
Sodium: 141 mEq/L (ref 136–145)
Total Bilirubin: <0.2 mg/dL (ref 0.20–1.20)
Total Protein: 6.7 g/dL (ref 6.4–8.3)

## 2014-05-12 LAB — CBC WITH DIFFERENTIAL/PLATELET
BASO%: 0.4 % (ref 0.0–2.0)
Basophils Absolute: 0 10*3/uL (ref 0.0–0.1)
EOS%: 0 % (ref 0.0–7.0)
Eosinophils Absolute: 0 10*3/uL (ref 0.0–0.5)
HCT: 37.2 % (ref 34.8–46.6)
HGB: 12.5 g/dL (ref 11.6–15.9)
LYMPH%: 16 % (ref 14.0–49.7)
MCH: 31.7 pg (ref 25.1–34.0)
MCHC: 33.5 g/dL (ref 31.5–36.0)
MCV: 94.5 fL (ref 79.5–101.0)
MONO#: 0.5 10*3/uL (ref 0.1–0.9)
MONO%: 7.3 % (ref 0.0–14.0)
NEUT#: 4.9 10*3/uL (ref 1.5–6.5)
NEUT%: 76.3 % (ref 38.4–76.8)
Platelets: 211 10*3/uL (ref 145–400)
RBC: 3.93 10*6/uL (ref 3.70–5.45)
RDW: 13.2 % (ref 11.2–14.5)
WBC: 6.4 10*3/uL (ref 3.9–10.3)
lymph#: 1 10*3/uL (ref 0.9–3.3)

## 2014-05-12 MED ORDER — MORPHINE SULFATE ER 30 MG PO TBCR: 30.0000 mg | EXTENDED_RELEASE_TABLET | Freq: Two times a day (BID) | ORAL | Status: DC

## 2014-05-12 MED ORDER — DEXAMETHASONE 4 MG PO TABS: 4.0000 mg | ORAL_TABLET | Freq: Every day | ORAL | Status: DC

## 2014-05-12 MED ORDER — BUTALBITAL-APAP-CAFFEINE 50-325-40 MG PO TABS: 1.0000 | ORAL_TABLET | Freq: Four times a day (QID) | ORAL | Status: DC | PRN

## 2014-05-12 NOTE — Assessment & Plan Note (Signed)
She had recent injection by her PCP with slight improvement of headache. She requested dexamethasone which she finds helpful but I prefer for her not to be on a long-term due to undesirable side effects of steroids. In the meantime, I recommend pain clinic consultation. I will increase her pain medication to 8 mg Dilaudid as needed and start her on MS Contin. I warned her about potential side effects of pain medication. I refilled her prescription Fioricet.

## 2014-05-12 NOTE — Progress Notes (Signed)
Dr.  Heath Lark, MD requested a consultation for genetic counseling and risk assessment for Victoria Bates, a 33 y.o. female, for discussion of her oligodendroglioma/astrocytoma mixed brain tumor and family history of breast, ovarian and prostate cancer.  She presents to clinic today to discuss the possibility of a genetic predisposition to cancer, and to further clarify her risks, as well as her family members' risks for cancer.   HISTORY OF PRESENT ILLNESS: In 2015, at the age of 69, Victoria Bates was diagnosed with mixed oligodendroglioma/astrocytoma of the brain. This was treated with surgery and chemotherapy. Victoria Bates has multiple cafe au lait spots.  She had a biopsy in 2003 of lumps on her right side, but she does not know what the pathology was.  Her son was seen by a pediatric neurologist based on his personal history of multiple CAL spots and headaches.  He had an MRI which found multiple "flares" that will be watched, and an outpocketing that is not called a chiari malformation, but is similar.  Victoria Bates also has a family history of breast and ovarian cancer.  She reports that her aunt who had bilateral breast cancer had BRCA testing and she thinks it was positive.  We do not have a test report.     Past Medical History  Diagnosis Date  . Liver hemangioma   . Anemia   . Headache(784.0)     migraines  . Anxiety   . Asthma     very mild- no inhaler use since July 2013  . Complication of anesthesia     takes longer for patient to go under anesthesia  . Brain tumor     Hx: of  . Infection     to right head incision  . Petit mal seizure status   . Dizziness   . Seizures     petit mal serizures most days  . Cancer 2003    skin  . Herniated disc   . Nausea alone 02/12/2014  . Dysphasia 02/12/2014  . Headache 02/12/2014  . Neuropathic pain 04/08/2014    Past Surgical History  Procedure Laterality Date  . Cesarean section    . Coposcopy  08/06/2012    cervical biopsy   . Left knee surgery  2000    torn meniscus  . Right flank surgery  2005    bnign mass from flank area-right  . Esophagogastroduodenoscopy (egd) with propofol N/A 08/29/2012    Procedure: ESOPHAGOGASTRODUODENOSCOPY (EGD) WITH PROPOFOL;  Surgeon: Arta Silence, MD;  Location: [facility];  Service: Endoscopy;  Laterality: N/A;  . Colonoscopy with propofol N/A 08/29/2012    Procedure: COLONOSCOPY WITH PROPOFOL;  Surgeon: Arta Silence, MD;  Location: [facility];  Service: Endoscopy;  Laterality: N/A;  . Wisdom tooth extraction    . Robotic assisted total hysterectomy with bilateral salpingo oopherectomy Bilateral 09/25/2012    Procedure: ROBOTIC ASSISTED TOTAL HYSTERECTOMY WITH BILATERAL SALPINGO OOPHORECTOMY;  Surgeon: Maeola Sarah. Landry Mellow, MD;  Location: [facility];  Service: Gynecology;  Laterality: Bilateral;  . Abdominal hysterectomy    . Craniotomy Right 09/26/2013    Procedure: Awake Right frontal open brain biopsy;  Surgeon: Winfield Cunas, MD;  Location: [facility];  Service: Neurosurgery;  Laterality: Right;  awake Right frontal open brain biopsy  . Craniotomy N/A 10/30/2013    Procedure: debridement of cranial wound  right temporal incision, ;  Surgeon: Winfield Cunas, MD;  Location: [facility];  Service: Neurosurgery;  Laterality: N/A;  . Craniotomy  N/A 01/23/2014    Procedure: Awake Craniotomy with BrainLab;  Surgeon: Winfield Cunas, MD;  Location: [facility];  Service: Neurosurgery;  Laterality: N/A;  Awake Craniotomy  tumor with BrainLab  . Rotator cuff repair      History   Social History  . Marital Status: Single    Spouse Name: N/A    Number of Children: 2  . Years of Education: College   Occupational History  . laser tech    Social History Main Topics  . Smoking status: Never Smoker   . Smokeless tobacco: Never Used  . Alcohol Use: No  . Drug Use: No  . Sexual Activity: Yes    Birth Control/ Protection: None   Other Topics Concern  . Not on file   Social History  Narrative   Patient lives at home with her two children.   Caffeine use: minimum of 4 cups daily    REPRODUCTIVE HISTORY AND PERSONAL RISK ASSESSMENT FACTORS: Menarche was at age 76-11.   premenopausal Uterus Intact: no Ovaries Intact: yes G2P2A0, first live birth at age 63  She has not previously undergone treatment for infertility.   Oral Contraceptive use: 0 years   She has not used HRT in the past.    FAMILY HISTORY:  We obtained a detailed, 4-generation family history.  Significant diagnoses are listed below: Family History  Problem Relation Age of Onset  . Hypertension Mother   . Skin cancer Mother   . Thyroid cancer Father   . Skin cancer Father   . Breast cancer Maternal Aunt     dx in her 73s  . Prostate cancer Maternal Uncle 63  . Melanoma Maternal Uncle   . Ovarian cancer Maternal Grandmother     dx in her late 34s-40s  . Breast cancer Maternal Aunt     bilateral breast cancer dx in 81s and 66s; possibly BRCA+  . Other Son     multiple CAL spots, bright spots on brain MRI, ? chiari malformation    Patient's maternal ancestors are of [place] and possible [place] descent, and paternal ancestors are of [place] descent. There is no reported Ashkenazi Jewish ancestry. There is no known consanguinity.  GENETIC COUNSELING ASSESSMENT: ASIANA Bates is a 33 y.o. female with a personal history of mixed oligodendroglioma/astrocytoma and family history of breast, ovarian and prostate cancer which somewhat suggestive of a hereditary cancer syndrome and predisposition to cancer. We, therefore, discussed and recommended the following at today's visit.   DISCUSSION: We reviewed the characteristics, features and inheritance patterns of hereditary cancer syndromes. We also discussed genetic testing, including the appropriate family members to test, the process of testing, insurance coverage and turn-around-time for results.   We reviewed that her personal history could be  suggestive of MMR cancer syndromes or TP53 as both of these result in an increased risk for brain tumors.  It is usually thought that the brain tumors from these conditions may occur at an earlier age than 75, but there is still a risk.  We also discussed NF testing based on her personal history of CAL spots and lumps that were biopsied in 2003, and her son's CAL spots. If her lumps were leiomyofibromas, there is a slight risk for FH mutations as well.    We discussed that her aunt may have a BRCA mutation.  While typically we would want a copy of the report to know what mutation to specifically look for, we can test for hereditary breast  cancer syndromes.  It would be important to have a copy of the aunt's report to be able to confirm whether a gene mutation is truly found, vs. A VUS, and which gene it was found.  PLAN: After considering the risks, benefits, and limitations, LYNANN DEMETRIUS provided informed consent to pursue genetic testing and the blood sample will be sent to [facility] for analysis of the CancerNext-Expanded. We discussed the implications of a positive, negative and/ or variant of uncertain significance genetic test result. Results should be available within approximately 2-4 weeks' time, at which point they will be disclosed by telephone to Gabriel Cirri, as will any additional recommendations warranted by these results. Gabriel Cirri will receive a summary of her genetic counseling visit and a copy of her results once available. This information will also be available in Epic. We encouraged Gabriel Cirri to remain in contact with cancer genetics annually so that we can continuously update the family history and inform her of any changes in cancer genetics and testing that may be of benefit for her family. Burnard Hawthorne Bedgood's questions were answered to her satisfaction today. Our contact information was provided should additional questions or concerns arise.  The  patient was seen for a total of 60 minutes, greater than 50% of which was spent face-to-face counseling.  This note will also be sent to the referring provider via the electronic medical record. The patient will be supplied with a summary of this genetic counseling discussion as well as educational information on the discussed hereditary cancer syndromes following the conclusion of their visit.     _______________________________________________________________________ For Office Staff:  Number of people involved in session: 1 Was an Intern/ student involved with case: no

## 2014-05-12 NOTE — Telephone Encounter (Signed)
Gave AVS & cal Dec.

## 2014-05-12 NOTE — Progress Notes (Signed)
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   09/27/2013 Pathology Results Accession: [id number] pathology showed grade 2 oligodendroglioma with mutant IDH1 protein, ATRX, OLIG 2 expression. p53 is negative, low Ki-67 labeling index. 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   11/06/2013 Imaging Repeat CT scan of the head showed resolution of abscess.   01/23/2014 Surgery Dr. Christella Noa perform a subtotal gross resection of the tumor.   01/24/2014 Imaging Repeat imaging studies showed persistent and residual disease.   03/03/2014 -  Chemotherapy She was started on Temodar, 5 days every 28 days   03/13/2014 Imaging Repeat MRI showed no evidence of progression of disease.    INTERVAL HISTORY: Please see below for problem oriented charting. She is very tearful today. She felt that her headaches are improved. She has one major seizure this past weekend after her husband's birthday celebration. The patient is very upset to be told that she could never go back to work. She has very poor short-term memory. She denies any permanent neurological deficits.  REVIEW OF SYSTEMS:   Constitutional: Denies fevers, chills or abnormal weight loss Eyes: Denies blurriness of vision Ears, nose, mouth, throat, and face: Denies mucositis or sore throat Respiratory: Denies cough, dyspnea or wheezes Cardiovascular: Denies palpitation, chest discomfort or lower extremity swelling Gastrointestinal:  Denies nausea, heartburn or change in bowel habits Skin: Denies abnormal skin rashes Lymphatics: Denies  new lymphadenopathy or easy bruising Neurological:Denies numbness, tingling or new weaknesses Behavioral/Psych: Mood is stable, no new changes  All other systems were reviewed with the patient and are negative.  I have reviewed the past medical history, past surgical history, social history and family history with the patient and they are unchanged from previous note.  ALLERGIES:  is allergic to adhesive; latex; percocet; shellfish allergy; soma; and vancomycin.  MEDICATIONS:  Current Outpatient Prescriptions  Medication Sig Dispense Refill  . albuterol (PROVENTIL HFA;VENTOLIN HFA) 108 (90 BASE) MCG/ACT inhaler Inhale 2 puffs into the lungs every 6 (six) hours as needed for wheezing or shortness of breath.       . ALPRAZolam (XANAX) 1 MG tablet Take 1 mg by mouth 2 (two) times daily as needed (for seizures).       . butalbital-acetaminophen-caffeine (FIORICET) 50-325-40 MG per tablet Take 1 tablet by mouth every 6 (six) hours as needed for headache.  90 tablet  0  . carisoprodol (SOMA) 350 MG tablet Take 350 mg by mouth 4 (four) times daily. Takes with Keppra for seizure prevention.      . citalopram (CELEXA) 10 MG tablet Take 1 tablet (10 mg total) by mouth daily. Take one tablet daily x 4 weeks then two tablets daily  60 tablet  1  . dexamethasone (DECADRON) 4 MG tablet Take 1 tablet (4 mg total) by mouth 2 (two) times daily with a meal.  30 tablet  0  . dexamethasone (DECADRON) 4 MG tablet Take 1 tablet (4 mg total) by mouth daily.  30 tablet  1  . divalproex (DEPAKOTE ER) 500 MG 24 hr tablet Take 1 tablet (500 mg total) by mouth 2 (two) times daily.  [place]  tablet  1  . gabapentin (NEURONTIN) 300 MG capsule Take 1 capsule (300 mg total) by mouth 3 (three) times daily.  270 capsule  3  . HYDROmorphone (DILAUDID) 8 MG tablet Take 1 tablet (8 mg total) by mouth every 6 (six) hours as needed for severe pain.  90 tablet  0  . levETIRAcetam (KEPPRA) 500 MG tablet Take 1 tablet (500 mg total) by mouth  4 (four) times daily. Taper Keppra to 500 mg 3 times daily for one week followed by twice daily for one week then once a day for one week and discontinue.  1 tablet  1  . morphine (MS CONTIN) 30 MG 12 hr tablet Take 1 tablet (30 mg total) by mouth every 12 (twelve) hours.  60 tablet  0  . ondansetron (ZOFRAN) 8 MG tablet Take 1 tablet (8 mg total) by mouth every 8 (eight) hours as needed for nausea or vomiting.  30 tablet  3  . temazepam (RESTORIL) 30 MG capsule Take 30 mg by mouth at bedtime as needed for sleep.      Marland Kitchen temozolomide (TEMODAR) 180 MG capsule Take 180 mg by mouth See admin instructions. Takes 2 capsules daily for 5 days, then stops for 23 days, then repeats.  May take on an empty stomach or at bedtime to decrease nausea & vomiting.       No current facility-administered medications for this visit.    PHYSICAL EXAMINATION: ECOG PERFORMANCE STATUS: 0 - Asymptomatic  Filed Vitals:   05/12/14 1159  BP: 126/81  Pulse: 68  Temp: 98 F (36.7 C)  Resp: 18   Filed Weights   05/12/14 1159  Weight: 158 lb 14.4 oz (72.077 kg)    GENERAL:alert, no distress and comfortable SKIN: skin color, texture, turgor are normal, no rashes or significant lesions EYES: normal, Conjunctiva are pink and non-injected, sclera clear Musculoskeletal:no cyanosis of digits and no clubbing  NEURO: alert & oriented x 3 with fluent speech, no focal motor/sensory deficits  LABORATORY DATA:  I have reviewed the data as listed    Component Value Date/Time   NA 141 05/12/2014 1145   NA 138 01/27/2014 0611   K 4.2 05/12/2014 1145   K 4.4 01/27/2014 0611   CL 99 01/27/2014 0611   CO2 25 05/12/2014 1145   CO2 26 01/27/2014 0611   GLUCOSE 91 05/12/2014 1145   GLUCOSE 115* 01/27/2014 0611   BUN 6.3* 05/12/2014 1145   BUN 8 01/27/2014 0611   CREATININE 0.7 05/12/2014 1145   CREATININE 0.48* 01/27/2014 0611   CALCIUM 9.2 05/12/2014 1145   CALCIUM 9.0 01/27/2014 0611   PROT 6.7 05/12/2014 1145   PROT 6.9  09/20/2013 0455   ALBUMIN 3.7 05/12/2014 1145   ALBUMIN 3.9 09/20/2013 0455   AST 12 05/12/2014 1145   AST 12 09/20/2013 0455   ALT 14 05/12/2014 1145   ALT 8 09/20/2013 0455   ALKPHOS 43 05/12/2014 1145   ALKPHOS 40 09/20/2013 0455   BILITOT <0.20 05/12/2014 1145   BILITOT 0.7 09/20/2013 0455   GFRNONAA >90 01/27/2014 0611   GFRAA >90 01/27/2014 0611    No results found for this basename: SPEP, UPEP,  kappa and lambda light chains    Lab Results  Component Value Date   WBC 6.4 05/12/2014   NEUTROABS 4.9 05/12/2014   HGB 12.5 05/12/2014   HCT 37.2 05/12/2014   MCV 94.5 05/12/2014   PLT 211 05/12/2014      Chemistry  Component Value Date/Time   NA 141 05/12/2014 1145   NA 138 01/27/2014 0611   K 4.2 05/12/2014 1145   K 4.4 01/27/2014 0611   CL 99 01/27/2014 0611   CO2 25 05/12/2014 1145   CO2 26 01/27/2014 0611   BUN 6.3* 05/12/2014 1145   BUN 8 01/27/2014 0611   CREATININE 0.7 05/12/2014 1145   CREATININE 0.48* 01/27/2014 0611      Component Value Date/Time   CALCIUM 9.2 05/12/2014 1145   CALCIUM 9.0 01/27/2014 0611   ALKPHOS 43 05/12/2014 1145   ALKPHOS 40 09/20/2013 0455   AST 12 05/12/2014 1145   AST 12 09/20/2013 0455   ALT 14 05/12/2014 1145   ALT 8 09/20/2013 0455   BILITOT <0.20 05/12/2014 1145   BILITOT 0.7 09/20/2013 0455     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 CT scan excluded new intracranial pathology. In the meantime, I will continue chemotherapy without dose adjustment.    Intractable chronic cluster headache She had recent injection by her PCP with slight improvement of headache. She requested dexamethasone which she finds helpful but I prefer for her not to be on a long-term due to undesirable side effects of steroids. In the meantime, I recommend pain clinic consultation. I will increase her pain  medication to 8 mg Dilaudid as needed and start her on MS Contin. I warned her about potential side effects of pain medication. I refilled her prescription Fioricet.   Simple partial seizures She has persistent seizures and her most recent seizure medications were changed. I recommend she continues her seizure medication as directed by her neurologist. CT scan of the head recently was negative.     All questions were answered. The patient knows to call the clinic with any problems, questions or concerns. No barriers to learning was detected. I spent 25 minutes counseling the patient face to face. The total time spent in the appointment was 30 minutes and more than 50% was on counseling and review of test results     [facility], Gregory, MD 05/12/2014 1:47 PM

## 2014-05-12 NOTE — Assessment & Plan Note (Signed)
She has persistent seizures and her most recent seizure medications were changed. I recommend she continues her seizure medication as directed by her neurologist. CT scan of the head recently was negative.

## 2014-05-12 NOTE — Assessment & Plan Note (Signed)
She tolerated chemotherapy well however has severe, uncontrolled headache. CT scan excluded new intracranial pathology. In the meantime, I will continue chemotherapy without dose adjustment.

## 2014-05-21 ENCOUNTER — Encounter: Payer: Self-pay | Admitting: Neurology

## 2014-05-21 ENCOUNTER — Ambulatory Visit (INDEPENDENT_AMBULATORY_CARE_PROVIDER_SITE_OTHER): Payer: Medicaid Other | Admitting: Neurology

## 2014-05-21 VITALS — BP 114/72 | HR 98 | Resp 18 | Ht 69.0 in | Wt 163.0 lb

## 2014-05-21 DIAGNOSIS — C719 Malignant neoplasm of brain, unspecified: Secondary | ICD-10-CM

## 2014-05-21 DIAGNOSIS — G40109 Localization-related (focal) (partial) symptomatic epilepsy and epileptic syndromes with simple partial seizures, not intractable, without status epilepticus: Secondary | ICD-10-CM

## 2014-05-21 NOTE — Progress Notes (Signed)
NEUROLOGY CONSULTATION NOTE  RABECCA Bates MRN: [id number] DOB: [birth year]  Referring provider: Dr. Lavone Orn Primary care provider: Dr. Lavone Orn  Reason for consult:  seizures  Dear Dr Laurann Montana:  Thank you for your kind referral of Victoria Bates for consultation of the above symptoms. Although her history is well known to you, please allow me to reiterate it for the purpose of our medical record. Records and images were personally reviewed where available.  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 She was admitted to [facility] last 09/19/2013 due to left vision changes and left facial heaviness. She reports that she had a 60-lb unintentional weight loss 4 months leading up to her admission, she was vomiting after every meal. She started having episodes where her hands would have low amplitude tremors and she feels the right lower face twitching. She would need to pause and take a minute to get words out. If she did not stop what she was doing, the symptoms could last for hours. She would feel like her body was "going 90 mph" with palpitations up to 200 bpm. She was started on Xanax for anxiety, but this did not help. She then started having vision changes on her left eye, she would park crookedly when she thought she was going straight. She saw an ophthalmologist, eye exam was normal but left optic neuritis was questioned and brain imaging recommended. She then started to have left facial heaviness and numbness on the left face, arm, and leg, and drove herself to [facility] where she was found to have a right frontal lesion. She underwent subtotal resection on 01/23/14 by Dr. Christella Noa and was started on Keppra for seizure prophylaxis at that time. She continued to have the recurrent episodes of tremors in both arms,  palpitations, and right facial tightness, as well as episodes of left face, neck, shoulder, and arm numbness, going down the left thumb and index finger. She describes her major seizures as "all of that times 100." After the episodes, she would be tired for a few days, wanting to sleep, with no energy. She has noticed that her short term memory is "gone." Keppra dose had been increased up to 2 grams daily, and this had caused a change in personality. She had seen neurologist Dr. Leonie Man, who switched her to Depakote. She tapered off Keppra and is currently on Depakote ER 536m BID with significant improvement in symptoms. She denies any side effects to the Depakote. She brings a calendar of her symptoms, and reports having a bigger episode on 10/26. Prior to this, she had seizures on 10/22 and 10/23, she was tired for several hours after and did not get out of bed. Last night she woke up drenched in sweat and took Xanax. She reports episodes of confusion, she was driving last Monday and went into a parking lot. Her son asked her what they were doing there and she did not realize where they were and how they got there. She reports these milder episodes occur a couple of times a week, she has more severe ones of memory gaps around once a week. She denies any olfactory/gustatory hallucinations, deja vu, rising epigastric sensation, myoclonic jerks.  She has a history of recurrent headaches since her teenage years. Headaches are usually over the right temporal and occipital region, with sharp stabbing and throbbing  pain, with a knot in her left neck region. There is occasional nausea, photo, and phonophobia. She was having these headaches around 1-2 times a week when younger, however since December 2014, headaches have been occurring around 2-3 times a week. She had been receiving injections in her neck for this. If she takes medication at the onset, they last a few hours, however they may last up to several days. She  has found that taking Decadron for a couple of days has been the only helpful medication. She takes Fioricet rarely. Zomig, aspirin, and Tylenol were ineffective in the past. Her father and 47 year old son have headaches. She denies any diplopia, dysarthria, dysphagia, bowel/bladder dysfunction. She has chronic back pain and had been taking Soma qid for several years, with worsening of pain when she tried reducing this. She was started on Gabapentin for the seizures and back pain, she is unsure if this helps. She had been taking Ultram for the pain, and once stopping this, she noticed an improvement in the seizures as well.    Diagnostic Data: Routine EEG done at [facility] reported amplitude asymmetry in the right frontotemporal regions which is likely from breach artifact from prior craniotomy. There is intermittent right frontal sharp activity but no definite epileptiform activity. Impression: Mild right hemispheric focal cortical irritability but no definitive epileptiform activity is noted.   I personally reviewed MRI brain with and without contrast done 03/13/14 which showed post-op right-sided craniotomy. The surgical cavity in the right frontal lobe is filled with fluid, with note of improvement in layering blood products since prior study. The study of 01/24/2014 showed restricted diffusion deep to the tumor. This is consistent with acute infarct which is resolving at this time. No acute infarct is present on today's study. No evidence of recurrent or residual tumor although the original tumor did not enhance. No mass effect or edema is present that would suggest recurrent tumor. Small amount of subarachnoid hemorrhage on the right again noted.  Epilepsy Risk Factors:  Right frontal oligodendroglioma s/p subtotal resection. Otherwise she had a normal birth and early development.  There is no history of febrile convulsions, CNS infections such as meningitis/encephalitis, or family history of seizures.  PAST  MEDICAL HISTORY: Past Medical History  Diagnosis Date  . Liver hemangioma   . Anemia   . Headache(784.0)     migraines  . Anxiety   . Asthma     very mild- no inhaler use since July 2013  . Complication of anesthesia     takes longer for patient to go under anesthesia  . Brain tumor     Hx: of  . Infection     to right head incision  . Petit mal seizure status   . Dizziness   . Seizures     petit mal serizures most days  . Cancer 2003    skin  . Herniated disc   . Nausea alone 02/12/2014  . Dysphasia 02/12/2014  . Headache 02/12/2014  . Neuropathic pain 04/08/2014    PAST SURGICAL HISTORY: Past Surgical History  Procedure Laterality Date  . Cesarean section    . Coposcopy  08/06/2012    cervical biopsy  . Left knee surgery  2000    torn meniscus  . Right flank surgery  2005    bnign mass from flank area-right  . Esophagogastroduodenoscopy (egd) with propofol N/A 08/29/2012    Procedure: ESOPHAGOGASTRODUODENOSCOPY (EGD) WITH PROPOFOL;  Surgeon: Arta Silence, MD;  Location: [facility];  Service: Endoscopy;  Laterality: N/A;  . Colonoscopy with propofol N/A 08/29/2012    Procedure: COLONOSCOPY WITH PROPOFOL;  Surgeon: Arta Silence, MD;  Location: [facility];  Service: Endoscopy;  Laterality: N/A;  . Wisdom tooth extraction    . Robotic assisted total hysterectomy with bilateral salpingo oopherectomy Bilateral 09/25/2012    Procedure: ROBOTIC ASSISTED TOTAL HYSTERECTOMY WITH BILATERAL SALPINGO OOPHORECTOMY;  Surgeon: Maeola Sarah. Landry Mellow, MD;  Location: [facility];  Service: Gynecology;  Laterality: Bilateral;  . Abdominal hysterectomy    . Craniotomy Right 09/26/2013    Procedure: Awake Right frontal open brain biopsy;  Surgeon: Winfield Cunas, MD;  Location: [place] [facility];  Service: Neurosurgery;  Laterality: Right;  awake Right frontal open brain biopsy  . Craniotomy N/A 10/30/2013    Procedure: debridement of cranial wound  right temporal incision, ;  Surgeon: Winfield Cunas, MD;   Location: [place];  Service: Neurosurgery;  Laterality: N/A;  . Craniotomy N/A 01/23/2014    Procedure: Awake Craniotomy with BrainLab;  Surgeon: Winfield Cunas, MD;  Location: [place] [facility];  Service: Neurosurgery;  Laterality: N/A;  Awake Craniotomy  tumor with BrainLab  . Rotator cuff repair      MEDICATIONS: Current Outpatient Prescriptions on File Prior to Visit  Medication Sig Dispense Refill  . albuterol (PROVENTIL HFA;VENTOLIN HFA) 108 (90 BASE) MCG/ACT inhaler Inhale 2 puffs into the lungs every 6 (six) hours as needed for wheezing or shortness of breath.     . ALPRAZolam (XANAX) 1 MG tablet Take 1 mg by mouth 2 (two) times daily as needed (for seizures).     . butalbital-acetaminophen-caffeine (FIORICET) 50-325-40 MG per tablet Take 1 tablet by mouth every 6 (six) hours as needed for headache. 90 tablet 0  . carisoprodol (SOMA) 350 MG tablet Take 350 mg by mouth 4 (four) times daily. Takes with Keppra for seizure prevention.    Marland Kitchen dexamethasone (DECADRON) 4 MG tablet Take 1 tablet (4 mg total) by mouth 2 (two) times daily with a meal. (Patient taking differently: Take 4 mg by mouth. Takes 1/2 tablet daily) 30 tablet 0  . divalproex (DEPAKOTE ER) 500 MG 24 hr tablet Take 1 tablet (500 mg total) by mouth 2 (two) times daily. 60 tablet 1  . gabapentin (NEURONTIN) 300 MG capsule Take 1 capsule (300 mg total) by mouth 3 (three) times daily. 270 capsule 3  . ondansetron (ZOFRAN) 8 MG tablet Take 1 tablet (8 mg total) by mouth every 8 (eight) hours as needed for nausea or vomiting. 30 tablet 3  . temozolomide (TEMODAR) 180 MG capsule Take 180 mg by mouth See admin instructions. Takes 2 capsules daily for 5 days, then stops for 23 days, then repeats.  May take on an empty stomach or at bedtime to decrease nausea & vomiting.    Marland Kitchen HYDROmorphone (DILAUDID) 8 MG tablet Take 1 tablet (8 mg total) by mouth every 6 (six) hours as needed for severe pain. 90 tablet 0   No current facility-administered  medications on file prior to visit.    ALLERGIES: Allergies  Allergen Reactions  . Adhesive [Tape] Other (See Comments)    Skin tear  . Latex Hives  . Percocet [Oxycodone-Acetaminophen] Other (See Comments)    makes pt angry  . Shellfish Allergy Diarrhea and Nausea And Vomiting  . Soma [Carisoprodol]     Watson brand, specifically, causes disorientation (can take other brands)  . Vancomycin Hives    FAMILY HISTORY: Family History  Problem Relation Age  of Onset  . Hypertension Mother   . Skin cancer Mother   . Thyroid cancer Father   . Skin cancer Father   . Breast cancer Maternal Aunt     dx in her 16s  . Prostate cancer Maternal Uncle 63  . Melanoma Maternal Uncle   . Ovarian cancer Maternal Grandmother     dx in her late 6s-40s  . Breast cancer Maternal Aunt     bilateral breast cancer dx in 38s and 15s; possibly BRCA+  . Other Son     multiple CAL spots, bright spots on brain MRI, ? chiari malformation    SOCIAL HISTORY: History   Social History  . Marital Status: Single    Spouse Name: N/A    Number of Children: 2  . Years of Education: College   Occupational History  . laser tech    Social History Main Topics  . Smoking status: Never Smoker   . Smokeless tobacco: Never Used  . Alcohol Use: No  . Drug Use: No  . Sexual Activity: Yes    Birth Control/ Protection: None   Other Topics Concern  . Not on file   Social History Narrative   Patient lives at home with her two children.   Caffeine use: minimum of 4 cups daily    REVIEW OF SYSTEMS: Constitutional: No fevers, chills, or sweats, no generalized fatigue, change in appetite Eyes: No visual changes, double vision, eye pain Ear, nose and throat: No hearing loss, ear pain, nasal congestion, sore throat Cardiovascular: No chest pain, +palpitations Respiratory:  No shortness of breath at rest or with exertion, wheezes GastrointestinaI: No nausea, vomiting, diarrhea, abdominal pain, fecal  incontinence Genitourinary:  No dysuria, urinary retention or frequency Musculoskeletal:  + neck pain,+ back pain Integumentary: No rash, pruritus, skin lesions Neurological: as above Psychiatric: No depression, insomnia, anxiety Endocrine: +palpitations,no fatigue, diaphoresis, mood swings, change in appetite, change in weight, increased thirst Hematologic/Lymphatic:  No anemia, purpura, petechiae. Allergic/Immunologic: no itchy/runny eyes, nasal congestion, recent allergic reactions, rashes  PHYSICAL EXAM: Filed Vitals:   05/21/14 1117  BP: 114/72  Pulse: 98  Resp: 18   General: No acute distress Head:  Normocephalic/atraumatic Eyes: Fundoscopic exam shows bilateral sharp discs, no vessel changes, exudates, or hemorrhages Neck: supple, no paraspinal tenderness, full range of motion Back: No paraspinal tenderness Heart: regular rate and rhythm Lungs: Clear to auscultation bilaterally. Vascular: No carotid bruits. Skin/Extremities: No rash, no edema Neurological Exam: Mental status: alert and oriented to person, place, and time, no dysarthria or aphasia, Fund of knowledge is appropriate.  Remote memory intact. 2/3 delayed recall.  Attention and concentration are normal.    Able to name objects and repeat phrases. Cranial nerves: CN I: not tested CN II: pupils equal, round and reactive to light, visual fields intact, fundi unremarkable. CN III, IV, VI:  full range of motion, no nystagmus, no ptosis CN V: facial sensation intact CN VII: upper and lower face symmetric CN VIII: hearing intact to finger rub CN IX, X: gag intact, uvula midline CN XI: sternocleidomastoid and trapezius muscles intact CN XII: tongue midline Bulk & Tone: normal, no fasciculations. Motor: 5/5 throughout with no pronator drift. Sensation: decreased pin and cold on the left neck region. Otherwise intact to light touch, cold, pin, vibration and joint position sense.  No extinction to double simultaneous  stimulation.  Romberg test negative Deep Tendon Reflexes: +1 throughout, no ankle clonus Plantar responses: downgoing bilaterally Cerebellar: no incoordination on finger to  nose, heel to shin. No dysdiadochokinesia Gait: narrow-based and steady, able to tandem walk adequately. 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 She also reports episodes of gaps in time and significant memory changes. The episodes of left face and arm numbness and gaps in time are concerning for partial seizures. The prolonged episodes of tremulousness, palpitations, and right facial tightening are atypical, however she reports improvement with Depakote ER 547m BID. She continues to have recurrent episodes on this dose and may benefit from increasing dose. Depakote level will be ordered to help guide further dose adjustment. The higher dose of Depakote may help with headaches as well. Side effects of Depakote were discussed. She will benefit from a prolonged 48-hour EEG to further classify her symptoms and assess for subclinical seizures as the possible cause of memory changes. She is unsure if gabapentin is helping, and will reduce dose to 3068mBID. She will monitor for any worsening of symptoms on lower dose. Continue same dose of Soma for now.  [place] driving laws were discussed with the patient, and she knows to stop driving after an episode of loss of awareness/consciousness/seizure, until 6 months event-free. She will follow-up in 3 months.  Thank you for allowing me to participate in the care of this patient. Please do not hesitate to call for any questions or concerns.   KaEllouise NewerM.D.  CC: Dr. GrLaurann Montana

## 2014-05-21 NOTE — Patient Instructions (Signed)
1. Check Depakote level 2. Schedule 48-hour EEG 3. Continue current dose of Depakote for now 4. Reduce Neurontin by 1 capsule at bedtime and monitor symptoms  Seizure Precautions: 1. If medication has been prescribed for you to prevent seizures, take it exactly as directed.  Do not stop taking the medicine without talking to your doctor first, even if you have not had a seizure in a long time.   2. Avoid activities in which a seizure would cause danger to yourself or to others.  Don't operate dangerous machinery, swim alone, or climb in high or dangerous places, such as on ladders, roofs, or girders.  Do not drive unless your doctor says you may.  3. If you have any warning that you may have a seizure, lay down in a safe place where you can't hurt yourself.    4.  No driving for 6 months from last seizure, as per [facility].   Please refer to the following link on the [place] website for more information: http://[email or web address]/[email or web address]/driving/drivingu.cfm   5.  Maintain good sleep hygiene.  6.  Notify your neurology if you are planning pregnancy or if you become pregnant.  7.  Contact your doctor if you have any problems that may be related to the medicine you are taking.  8.  Call 911 and bring the patient back to the ED if:        A.  The seizure lasts longer than 5 minutes.       B.  The patient doesn't awaken shortly after the seizure  C.  The patient has new problems such as difficulty seeing, speaking or moving  D.  The patient was injured during the seizure  E.  The patient has a temperature over 102 F (39C)  F.  The patient vomited and now is having trouble breathing

## 2014-05-22 ENCOUNTER — Telehealth: Payer: Self-pay | Admitting: *Deleted

## 2014-05-22 NOTE — Telephone Encounter (Signed)
Returned call. Patient wanted clarification on having depakote level checked & taking med. Did tell patient she is supposed to have labs drawn 1 morning before she takes he morning dose of the depakote then take med after she has blood drawn.

## 2014-05-22 NOTE — Telephone Encounter (Signed)
Patient calling to ask a few questions about her lab work and her Depakote levels  Call back # [phone]

## 2014-05-24 LAB — VALPROIC ACID LEVEL: Valproic Acid Lvl: 32 ug/mL — ABNORMAL LOW (ref 50.0–100.0)

## 2014-05-26 MED ORDER — DIVALPROEX SODIUM ER 250 MG PO TB24
ORAL_TABLET | ORAL | Status: DC
Start: 2014-05-26 — End: 2014-12-07

## 2014-05-26 NOTE — Addendum Note (Signed)
Addended by: Thurmon Fair on: 05/26/2014 09:39 AM   Modules accepted: Orders

## 2014-05-29 ENCOUNTER — Ambulatory Visit: Payer: Medicaid Other | Admitting: Neurology

## 2014-06-10 ENCOUNTER — Other Ambulatory Visit: Payer: Self-pay | Admitting: Hematology and Oncology

## 2014-06-10 ENCOUNTER — Telehealth: Payer: Self-pay | Admitting: *Deleted

## 2014-06-10 DIAGNOSIS — C711 Malignant neoplasm of frontal lobe: Secondary | ICD-10-CM

## 2014-06-10 MED ORDER — HYDROMORPHONE HCL 8 MG PO TABS: 8.0000 mg | ORAL_TABLET | Freq: Four times a day (QID) | ORAL | Status: DC | PRN

## 2014-06-10 NOTE — Telephone Encounter (Signed)
Informed pt of Rx ready to pick up.  She verbalized understanding.

## 2014-06-10 NOTE — Telephone Encounter (Signed)
Pt requests refill on Hydromorphone.

## 2014-06-13 ENCOUNTER — Telehealth: Payer: Self-pay | Admitting: Genetic Counselor

## 2014-06-13 ENCOUNTER — Encounter: Payer: Self-pay | Admitting: Genetic Counselor

## 2014-06-13 DIAGNOSIS — Z1379 Encounter for other screening for genetic and chromosomal anomalies: Secondary | ICD-10-CM | POA: Insufficient documentation

## 2014-06-13 NOTE — Telephone Encounter (Signed)
LM on cell with good news.  Asked that she call back.

## 2014-06-16 ENCOUNTER — Telehealth: Payer: Self-pay | Admitting: Genetic Counselor

## 2014-06-16 NOTE — Telephone Encounter (Signed)
Revealed negative genetic testing on the cancernext expanded test.  She did have two VUS in BRIP1 and SDHA.

## 2014-06-17 ENCOUNTER — Encounter: Payer: Self-pay | Admitting: Genetic Counselor

## 2014-06-23 ENCOUNTER — Telehealth: Payer: Self-pay | Admitting: Hematology and Oncology

## 2014-06-23 ENCOUNTER — Ambulatory Visit (HOSPITAL_BASED_OUTPATIENT_CLINIC_OR_DEPARTMENT_OTHER): Payer: Medicaid Other | Admitting: Hematology and Oncology

## 2014-06-23 ENCOUNTER — Other Ambulatory Visit (HOSPITAL_BASED_OUTPATIENT_CLINIC_OR_DEPARTMENT_OTHER): Payer: Medicaid Other

## 2014-06-23 ENCOUNTER — Encounter: Payer: Self-pay | Admitting: Hematology and Oncology

## 2014-06-23 VITALS — BP 129/84 | HR 89 | Temp 97.8°F | Resp 18 | Ht 69.0 in | Wt 163.2 lb

## 2014-06-23 DIAGNOSIS — G44011 Episodic cluster headache, intractable: Secondary | ICD-10-CM

## 2014-06-23 DIAGNOSIS — R110 Nausea: Secondary | ICD-10-CM

## 2014-06-23 DIAGNOSIS — D61810 Antineoplastic chemotherapy induced pancytopenia: Secondary | ICD-10-CM | POA: Insufficient documentation

## 2014-06-23 DIAGNOSIS — G40109 Localization-related (focal) (partial) symptomatic epilepsy and epileptic syndromes with simple partial seizures, not intractable, without status epilepticus: Secondary | ICD-10-CM

## 2014-06-23 DIAGNOSIS — R51 Headache: Secondary | ICD-10-CM

## 2014-06-23 DIAGNOSIS — D701 Agranulocytosis secondary to cancer chemotherapy: Secondary | ICD-10-CM

## 2014-06-23 DIAGNOSIS — D72818 Other decreased white blood cell count: Secondary | ICD-10-CM

## 2014-06-23 DIAGNOSIS — C711 Malignant neoplasm of frontal lobe: Secondary | ICD-10-CM

## 2014-06-23 DIAGNOSIS — R112 Nausea with vomiting, unspecified: Secondary | ICD-10-CM | POA: Insufficient documentation

## 2014-06-23 DIAGNOSIS — R079 Chest pain, unspecified: Secondary | ICD-10-CM

## 2014-06-23 DIAGNOSIS — R4702 Dysphasia: Secondary | ICD-10-CM

## 2014-06-23 DIAGNOSIS — R519 Headache, unspecified: Secondary | ICD-10-CM

## 2014-06-23 DIAGNOSIS — R202 Paresthesia of skin: Secondary | ICD-10-CM

## 2014-06-23 DIAGNOSIS — G44021 Chronic cluster headache, intractable: Secondary | ICD-10-CM

## 2014-06-23 DIAGNOSIS — C719 Malignant neoplasm of brain, unspecified: Secondary | ICD-10-CM

## 2014-06-23 DIAGNOSIS — R1115 Cyclical vomiting syndrome unrelated to migraine: Secondary | ICD-10-CM

## 2014-06-23 DIAGNOSIS — T451X5A Adverse effect of antineoplastic and immunosuppressive drugs, initial encounter: Secondary | ICD-10-CM | POA: Insufficient documentation

## 2014-06-23 LAB — CBC WITH DIFFERENTIAL/PLATELET
BASO%: 1.3 % (ref 0.0–2.0)
Basophils Absolute: 0 10*3/uL (ref 0.0–0.1)
EOS%: 1 % (ref 0.0–7.0)
Eosinophils Absolute: 0 10*3/uL (ref 0.0–0.5)
HCT: 38.1 % (ref 34.8–46.6)
HGB: 12.6 g/dL (ref 11.6–15.9)
LYMPH%: 38.2 % (ref 14.0–49.7)
MCH: 32 pg (ref 25.1–34.0)
MCHC: 33.1 g/dL (ref 31.5–36.0)
MCV: 96.5 fL (ref 79.5–101.0)
MONO#: 0.3 10*3/uL (ref 0.1–0.9)
MONO%: 11.2 % (ref 0.0–14.0)
NEUT#: 1.3 10*3/uL — ABNORMAL LOW (ref 1.5–6.5)
NEUT%: 48.3 % (ref 38.4–76.8)
Platelets: 188 10*3/uL (ref 145–400)
RBC: 3.95 10*6/uL (ref 3.70–5.45)
RDW: 13.1 % (ref 11.2–14.5)
WBC: 2.6 10*3/uL — ABNORMAL LOW (ref 3.9–10.3)
lymph#: 1 10*3/uL (ref 0.9–3.3)

## 2014-06-23 LAB — COMPREHENSIVE METABOLIC PANEL (CC13)
ALT: 15 U/L (ref 0–55)
AST: 17 U/L (ref 5–34)
Albumin: 3.6 g/dL (ref 3.5–5.0)
Alkaline Phosphatase: 44 U/L (ref 40–150)
Anion Gap: 8 mEq/L (ref 3–11)
BUN: 5.9 mg/dL — ABNORMAL LOW (ref 7.0–26.0)
CO2: 27 mEq/L (ref 22–29)
Calcium: 9 mg/dL (ref 8.4–10.4)
Chloride: 106 mEq/L (ref 98–109)
Creatinine: 0.7 mg/dL (ref 0.6–1.1)
EGFR: >90 mL/min/{1.73_m2} (ref >90)
Glucose: 84 mg/dl (ref 70–140)
Potassium: 5 mEq/L (ref 3.5–5.1)
Sodium: 141 mEq/L (ref 136–145)
Total Bilirubin: 0.36 mg/dL (ref 0.20–1.20)
Total Protein: 6.2 g/dL — ABNORMAL LOW (ref 6.4–8.3)

## 2014-06-23 MED ORDER — MORPHINE SULFATE ER 30 MG PO TBCR: 30.0000 mg | EXTENDED_RELEASE_TABLET | Freq: Two times a day (BID) | ORAL | Status: DC

## 2014-06-23 MED ORDER — BUTALBITAL-APAP-CAFFEINE 50-325-40 MG PO TABS: 1.0000 | ORAL_TABLET | Freq: Four times a day (QID) | ORAL | Status: DC | PRN

## 2014-06-23 NOTE — Assessment & Plan Note (Signed)
This is likely due to recent treatment. The patient denies recent history of fevers, cough, chills, diarrhea or dysuria. She is asymptomatic from the leukopenia. I will observe for now.    

## 2014-06-23 NOTE — Assessment & Plan Note (Signed)
She is pleased with her current anti-seizure management by her neurologist. I will repeat MRI of the head at the end of the month and she has EEG plan by her neurologist.

## 2014-06-23 NOTE — Assessment & Plan Note (Signed)
She has significant anticipatory nausea and leukopenia from treatment. I recommend holding off treatment until after Christmas. I plan to repeat imaging study of the head and blood work before we resume treatment.

## 2014-06-23 NOTE — Assessment & Plan Note (Signed)
She had recent injection by her PCP with slight improvement of headache. She is also taking dexamethasone prn. I will increase her pain medication to 8 mg Dilaudid as needed and start her on MS Contin. I warned her about potential side effects of pain medication. I refilled her prescription Fioricet. Her pain control is much better and she is not taking Dilaudid as frequent as her last month's visit

## 2014-06-23 NOTE — Assessment & Plan Note (Signed)
It appears that she has element of anticipatory nausea. I recommend discontinuation of chemotherapy for now, repeat imaging study with plan to adjust her antiemetics in the future. She agreed.

## 2014-06-23 NOTE — Progress Notes (Signed)
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   09/27/2013 Pathology Results Accession: [id number] pathology showed grade 2 oligodendroglioma with mutant IDH1 protein, ATRX, OLIG 2 expression. p53 is negative, low Ki-67 labeling index. 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   11/06/2013 Imaging Repeat CT scan of the head showed resolution of abscess.   01/23/2014 Surgery Dr. Christella Noa perform a subtotal gross resection of the tumor.   01/24/2014 Imaging Repeat imaging studies showed persistent and residual disease.   03/03/2014 -  Chemotherapy She was started on Temodar, 5 days every 28 days   03/13/2014 Imaging Repeat MRI showed no evidence of progression of disease.    INTERVAL HISTORY: Please see below for problem oriented charting. She denies new neurological deficit. Her seizure management has improved. She still have recurrent headaches but it is better controlled with current pain regimen and she is not using Dilaudid as much. She has intractable nausea and vomiting around the time of the chemotherapy. She denies further incisional pain around her surgical site.  REVIEW OF SYSTEMS:   Constitutional: Denies fevers, chills or abnormal weight loss Eyes: Denies blurriness of vision Ears, nose, mouth, throat, and face: Denies mucositis or sore throat Respiratory: Denies cough, dyspnea or wheezes Cardiovascular: Denies palpitation, chest discomfort or lower extremity swelling Skin: Denies abnormal skin rashes Lymphatics: Denies new lymphadenopathy or easy  bruising Neurological:Denies numbness, tingling or new weaknesses Behavioral/Psych: Mood is stable, no new changes  All other systems were reviewed with the patient and are negative.  I have reviewed the past medical history, past surgical history, social history and family history with the patient and they are unchanged from previous note.  ALLERGIES:  is allergic to adhesive; latex; percocet; shellfish allergy; soma; and vancomycin.  MEDICATIONS:  Current Outpatient Prescriptions  Medication Sig Dispense Refill  . albuterol (PROVENTIL HFA;VENTOLIN HFA) 108 (90 BASE) MCG/ACT inhaler Inhale 2 puffs into the lungs every 6 (six) hours as needed for wheezing or shortness of breath.     . ALPRAZolam (XANAX) 1 MG tablet Take 1 mg by mouth 2 (two) times daily as needed (for seizures).     . butalbital-acetaminophen-caffeine (FIORICET) 50-325-40 MG per tablet Take 1 tablet by mouth every 6 (six) hours as needed for headache. 90 tablet 0  . carisoprodol (SOMA) 350 MG tablet Take 350 mg by mouth 4 (four) times daily. Takes with Keppra for seizure prevention.    Marland Kitchen dexamethasone (DECADRON) 4 MG tablet Take 1 tablet (4 mg total) by mouth 2 (two) times daily with a meal. (Patient taking differently: Take 4 mg by mouth as needed. Takes 1/2 tablet daily) 30 tablet 0  . divalproex (DEPAKOTE ER) 250 MG 24 hr tablet Take 2 tablets every am and 3 tablets every pm 150 tablet 3  . gabapentin (NEURONTIN) 300 MG capsule Take 1 capsule (300 mg total) by mouth 3 (three) times daily. 270 capsule 3  . HYDROmorphone (DILAUDID) 8 MG tablet Take 1 tablet (8 mg total) by mouth every 6 (six) hours as needed for severe pain. 90 tablet 0  . ondansetron (ZOFRAN) 8 MG tablet Take 1 tablet (8  mg total) by mouth every 8 (eight) hours as needed for nausea or vomiting. 30 tablet 3  . temozolomide (TEMODAR) 180 MG capsule Take 180 mg by mouth See admin instructions. Takes 2 capsules daily for 5 days, then stops for 23 days, then  repeats.  May take on an empty stomach or at bedtime to decrease nausea & vomiting.    Marland Kitchen morphine (MS CONTIN) 30 MG 12 hr tablet Take 1 tablet (30 mg total) by mouth every 12 (twelve) hours. 60 tablet 0   No current facility-administered medications for this visit.    PHYSICAL EXAMINATION: ECOG PERFORMANCE STATUS: 1 - Symptomatic but completely ambulatory  Filed Vitals:   06/23/14 1152  BP: 129/84  Pulse: 89  Temp: 97.8 F (36.6 C)  Resp: 18   Filed Weights   06/23/14 1152  Weight: 163 lb 3.2 oz (74.027 kg)    GENERAL:alert, no distress and comfortable SKIN: skin color, texture, turgor are normal, no rashes or significant lesions EYES: normal, Conjunctiva are pink and non-injected, sclera clear OROPHARYNX:no exudate, no erythema and lips, buccal mucosa, and tongue normal  NECK: supple, thyroid normal size, non-tender, without nodularity LYMPH:  no palpable lymphadenopathy in the cervical, axillary or inguinal LUNGS: clear to auscultation and percussion with normal breathing effort HEART: regular rate & rhythm and no murmurs and no lower extremity edema ABDOMEN:abdomen soft, non-tender and normal bowel sounds Musculoskeletal:no cyanosis of digits and no clubbing  NEURO: alert & oriented x 3 with fluent speech, no focal motor/sensory deficits  LABORATORY DATA:  I have reviewed the data as listed    Component Value Date/Time   NA 141 06/23/2014 1140   NA 138 01/27/2014 0611   K 5.0 06/23/2014 1140   K 4.4 01/27/2014 0611   CL 99 01/27/2014 0611   CO2 27 06/23/2014 1140   CO2 26 01/27/2014 0611   GLUCOSE 84 06/23/2014 1140   GLUCOSE 115* 01/27/2014 0611   BUN 5.9* 06/23/2014 1140   BUN 8 01/27/2014 0611   CREATININE 0.7 06/23/2014 1140   CREATININE 0.48* 01/27/2014 0611   CALCIUM 9.0 06/23/2014 1140   CALCIUM 9.0 01/27/2014 0611   PROT 6.2* 06/23/2014 1140   PROT 6.9 09/20/2013 0455   ALBUMIN 3.6 06/23/2014 1140   ALBUMIN 3.9 09/20/2013 0455   AST 17 06/23/2014  1140   AST 12 09/20/2013 0455   ALT 15 06/23/2014 1140   ALT 8 09/20/2013 0455   ALKPHOS 44 06/23/2014 1140   ALKPHOS 40 09/20/2013 0455   BILITOT 0.36 06/23/2014 1140   BILITOT 0.7 09/20/2013 0455   GFRNONAA >90 01/27/2014 0611   GFRAA >90 01/27/2014 0611    No results found for: SPEP, UPEP  Lab Results  Component Value Date   WBC 2.6* 06/23/2014   NEUTROABS 1.3* 06/23/2014   HGB 12.6 06/23/2014   HCT 38.1 06/23/2014   MCV 96.5 06/23/2014   PLT 188 06/23/2014      Chemistry      Component Value Date/Time   NA 141 06/23/2014 1140   NA 138 01/27/2014 0611   K 5.0 06/23/2014 1140   K 4.4 01/27/2014 0611   CL 99 01/27/2014 0611   CO2 27 06/23/2014 1140   CO2 26 01/27/2014 0611   BUN 5.9* 06/23/2014 1140   BUN 8 01/27/2014 0611   CREATININE 0.7 06/23/2014 1140   CREATININE 0.48* 01/27/2014 0611      Component Value Date/Time   CALCIUM 9.0 06/23/2014 1140   CALCIUM 9.0 01/27/2014 8882  ALKPHOS 44 06/23/2014 1140   ALKPHOS 40 09/20/2013 0455   AST 17 06/23/2014 1140   AST 12 09/20/2013 0455   ALT 15 06/23/2014 1140   ALT 8 09/20/2013 0455   BILITOT 0.36 06/23/2014 1140   BILITOT 0.7 09/20/2013 0455      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 I recommend holding off treatment until after Christmas. I plan to repeat imaging study of the head and blood work before we resume treatment.  Intractable chronic cluster headache She had recent injection by her PCP with slight improvement of headache. She is also taking dexamethasone prn. I will increase her pain medication to 8 mg Dilaudid as needed and start her on MS Contin. I warned her about potential side effects of pain medication. I refilled her prescription Fioricet. Her pain control is much better and she is not taking Dilaudid as frequent as her last  month's visit  Simple partial seizures She is pleased with her current anti-seizure management by her neurologist. I will repeat MRI of the head at the end of the month and she has EEG plan by her neurologist.  Leukopenia due to antineoplastic chemotherapy This is likely due to recent treatment. The patient denies recent history of fevers, cough, chills, diarrhea or dysuria. She is asymptomatic from the leukopenia. I will observe for now.   Nausea & vomiting It appears that she has element of anticipatory nausea. I recommend discontinuation of chemotherapy for now, repeat imaging study with plan to adjust her antiemetics in the future. She agreed.   Orders Placed This Encounter  Procedures  . MR Brain W Contrast    Standing Status: Future     Number of Occurrences:      Standing Expiration Date: 08/23/2015    Order Specific Question:  Reason for Exam (SYMPTOM  OR DIAGNOSIS REQUIRED)    Answer:  brain tumor s/p chemo, exclude reaction    Order Specific Question:  Is the patient pregnant?    Answer:  No    Order Specific Question:  Preferred imaging location?    Answer:  [facility]    Order Specific Question:  Does the patient have a pacemaker or implanted devices?    Answer:  No    Order Specific Question:  What is the patient's sedation requirement?    Answer:  No Sedation   All questions were answered. The patient knows to call the clinic with any problems, questions or concerns. No barriers to learning was detected. I spent 30 minutes counseling the patient face to face. The total time spent in the appointment was 40 minutes and more than 50% was on counseling and review of test results     [facility], [place], MD 06/23/2014 7:35 PM

## 2014-06-23 NOTE — Telephone Encounter (Signed)
gv adn printed appts ched and avs for pt for DEC °

## 2014-06-24 ENCOUNTER — Telehealth: Payer: Self-pay | Admitting: *Deleted

## 2014-06-24 NOTE — Telephone Encounter (Signed)
Received call from pt stating that pt had spoken with Clarise Cruz, RN at her neurologist Dr. Lacy Duverney office  about her upcoming MRI Brain scheduled for 07/14/14.   Pt wanted to make sure there is no duplicate orders  for the MRI scheduled due to insurance issue ( from Dr. Christella Noa and from Dr. Alvy Bimler ). Called pt back and left message on voice mail informing pt that there is only one MRI Brain scheduled at this time. Pt's  Phone    [phone].

## 2014-07-07 ENCOUNTER — Other Ambulatory Visit: Payer: Self-pay | Admitting: Neurology

## 2014-07-07 NOTE — Telephone Encounter (Signed)
Last Appt Cancelled noting: Patient (patient does not wish to come back to see Dr. Leonie Man and does not wish to r/s ) Dr Delice Lesch prescribes this med now

## 2014-07-09 ENCOUNTER — Telehealth: Payer: Self-pay | Admitting: Medical Oncology

## 2014-07-09 NOTE — Telephone Encounter (Signed)
Patient returned call to office and states she will talk to Dr. Hewitt Shorts office regarding MRI. Patient knows that Dr. Alvy Bimler is out of office till Monday and acknowledges receipt of mssg regarding MRI still waiting on preauth from her insurance to be done here. States she will call back and let us know what Dr. Cyndy Freeze says.   Mssg placed on Dr. Calton Dach desk.

## 2014-07-09 NOTE — Telephone Encounter (Signed)
Pt LVMOM requesting to know whether MRI of brain, sched for 12/28, has been preauth by insurance. States Dr. Jennelle Human office is saying they want to have it done there.  Spoke with managed care, Benedetto Goad, per Vaughan Basta, patient's MRI not preauth at this point in time and may or may not be approved by the appointment date of the 28th, waiting on pt's insurance. Returned call to pt, LVMOM, with the above info and asked patient to return call to office.

## 2014-07-14 ENCOUNTER — Other Ambulatory Visit (HOSPITAL_BASED_OUTPATIENT_CLINIC_OR_DEPARTMENT_OTHER): Payer: Medicaid Other

## 2014-07-14 ENCOUNTER — Ambulatory Visit (HOSPITAL_COMMUNITY)
Admission: RE | Admit: 2014-07-14 | Discharge: 2014-07-14 | Disposition: A | Discharge status: Home / Self Care | Payer: Medicaid Other | Source: Ambulatory Visit | Attending: Hematology and Oncology | Admitting: Hematology and Oncology

## 2014-07-14 ENCOUNTER — Other Ambulatory Visit (HOSPITAL_COMMUNITY): Payer: Self-pay | Admitting: Neurosurgery

## 2014-07-14 ENCOUNTER — Encounter: Payer: Self-pay | Admitting: Hematology and Oncology

## 2014-07-14 DIAGNOSIS — G44011 Episodic cluster headache, intractable: Secondary | ICD-10-CM

## 2014-07-14 DIAGNOSIS — R519 Headache, unspecified: Secondary | ICD-10-CM

## 2014-07-14 DIAGNOSIS — R079 Chest pain, unspecified: Secondary | ICD-10-CM

## 2014-07-14 DIAGNOSIS — C719 Malignant neoplasm of brain, unspecified: Secondary | ICD-10-CM

## 2014-07-14 DIAGNOSIS — C711 Malignant neoplasm of frontal lobe: Secondary | ICD-10-CM

## 2014-07-14 DIAGNOSIS — R51 Headache: Secondary | ICD-10-CM

## 2014-07-14 DIAGNOSIS — R4702 Dysphasia: Secondary | ICD-10-CM

## 2014-07-14 DIAGNOSIS — R110 Nausea: Secondary | ICD-10-CM

## 2014-07-14 DIAGNOSIS — R202 Paresthesia of skin: Secondary | ICD-10-CM

## 2014-07-14 LAB — CBC WITH DIFFERENTIAL/PLATELET
BASO%: 0.9 % (ref 0.0–2.0)
Basophils Absolute: 0 10*3/uL (ref 0.0–0.1)
EOS%: 1.2 % (ref 0.0–7.0)
Eosinophils Absolute: 0.1 10*3/uL (ref 0.0–0.5)
HCT: 39.1 % (ref 34.8–46.6)
HGB: 13 g/dL (ref 11.6–15.9)
LYMPH%: 25.9 % (ref 14.0–49.7)
MCH: 32.2 pg (ref 25.1–34.0)
MCHC: 33.4 g/dL (ref 31.5–36.0)
MCV: 96.6 fL (ref 79.5–101.0)
MONO#: 0.4 10*3/uL (ref 0.1–0.9)
MONO%: 8.9 % (ref 0.0–14.0)
NEUT#: 2.7 10*3/uL (ref 1.5–6.5)
NEUT%: 63.1 % (ref 38.4–76.8)
Platelets: 227 10*3/uL (ref 145–400)
RBC: 4.04 10*6/uL (ref 3.70–5.45)
RDW: 12.4 % (ref 11.2–14.5)
WBC: 4.2 10*3/uL (ref 3.9–10.3)
lymph#: 1.1 10*3/uL (ref 0.9–3.3)

## 2014-07-14 LAB — COMPREHENSIVE METABOLIC PANEL (CC13)
ALT: 19 U/L (ref 0–55)
AST: 16 U/L (ref 5–34)
Albumin: 3.7 g/dL (ref 3.5–5.0)
Alkaline Phosphatase: 45 U/L (ref 40–150)
Anion Gap: 7 mEq/L (ref 3–11)
BUN: 7.7 mg/dL (ref 7.0–26.0)
CO2: 27 mEq/L (ref 22–29)
Calcium: 9.1 mg/dL (ref 8.4–10.4)
Chloride: 106 mEq/L (ref 98–109)
Creatinine: 0.7 mg/dL (ref 0.6–1.1)
EGFR: >90 mL/min/{1.73_m2} (ref >90)
Glucose: 85 mg/dl (ref 70–140)
Potassium: 4.8 mEq/L (ref 3.5–5.1)
Sodium: 140 mEq/L (ref 136–145)
Total Bilirubin: 0.39 mg/dL (ref 0.20–1.20)
Total Protein: 6.4 g/dL (ref 6.4–8.3)

## 2014-07-14 MED ORDER — GADOBENATE DIMEGLUMINE 529 MG/ML IV SOLN
15.0000 mL | Freq: Once | INTRAVENOUS | Status: AC | PRN
  Administered 2014-07-14: 15 mL via INTRAVENOUS

## 2014-07-14 NOTE — Progress Notes (Unsigned)
07/14/2014  I left a voicemail message for patient to return my phone call RE: Brain MRI.  Benedetto Goad

## 2014-07-17 ENCOUNTER — Other Ambulatory Visit: Payer: Self-pay | Admitting: Hematology and Oncology

## 2014-07-17 ENCOUNTER — Telehealth: Payer: Self-pay | Admitting: Hematology and Oncology

## 2014-07-17 ENCOUNTER — Ambulatory Visit (HOSPITAL_BASED_OUTPATIENT_CLINIC_OR_DEPARTMENT_OTHER): Payer: Medicaid Other | Admitting: Hematology and Oncology

## 2014-07-17 ENCOUNTER — Encounter: Payer: Self-pay | Admitting: Hematology and Oncology

## 2014-07-17 VITALS — BP 124/61 | HR 97 | Temp 98.1°F | Resp 18 | Ht 69.0 in | Wt 165.3 lb

## 2014-07-17 DIAGNOSIS — G40109 Localization-related (focal) (partial) symptomatic epilepsy and epileptic syndromes with simple partial seizures, not intractable, without status epilepticus: Secondary | ICD-10-CM

## 2014-07-17 DIAGNOSIS — C711 Malignant neoplasm of frontal lobe: Secondary | ICD-10-CM

## 2014-07-17 DIAGNOSIS — G44021 Chronic cluster headache, intractable: Secondary | ICD-10-CM

## 2014-07-17 MED ORDER — TEMOZOLOMIDE 180 MG PO CAPS: 180.0000 mg | ORAL_CAPSULE | ORAL | Status: DC

## 2014-07-17 NOTE — Assessment & Plan Note (Signed)
She had recent injection by her PCP with slight improvement of headache. She is also taking dexamethasone prn. She is taking pain medication rally and continue to take Fioricet as needed. This is stable.

## 2014-07-17 NOTE — Assessment & Plan Note (Signed)
Thankfully, repeat imaging study looks good. She will resume Temodar next week on 07/21/2014. I will continue to see her on a monthly basis.

## 2014-07-17 NOTE — Telephone Encounter (Signed)
Gave avs & cal for Feb. °

## 2014-07-17 NOTE — Progress Notes (Signed)
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   09/27/2013 Pathology Results Accession: [id number] pathology showed grade 2 oligodendroglioma with mutant IDH1 protein, ATRX, OLIG 2 expression. p53 is negative, low Ki-67 labeling index. 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   11/06/2013 Imaging Repeat CT scan of the head showed resolution of abscess.   01/23/2014 Surgery Dr. Cabbell perform a subtotal gross resection of the tumor.   01/24/2014 Imaging Repeat imaging studies showed persistent and residual disease.   03/03/2014 -  Chemotherapy She was started on Temodar, 5 days every 28 days   03/13/2014 Imaging Repeat MRI showed no evidence of progression of disease.   07/14/2014 Imaging Repeat MRI showed no evidence of disease progression.    INTERVAL HISTORY: Please see below for problem oriented charting. She returns for further follow-up. She denies recent severe headache. No nausea vomiting. She has recurrent seizures but they were not severe.  REVIEW OF SYSTEMS:   Constitutional: Denies fevers, chills or abnormal weight loss Eyes: Denies blurriness of vision Ears, nose, mouth, throat, and face: Denies mucositis or sore throat Respiratory: Denies cough, dyspnea or wheezes Cardiovascular: Denies palpitation, chest discomfort or lower extremity swelling Gastrointestinal:  Denies nausea, heartburn or change in bowel habits Skin: Denies abnormal skin rashes Lymphatics: Denies new lymphadenopathy or easy bruising Neurological:Denies numbness, tingling or new  weaknesses Behavioral/Psych: Mood is stable, no new changes  All other systems were reviewed with the patient and are negative.  I have reviewed the past medical history, past surgical history, social history and family history with the patient and they are unchanged from previous note.  ALLERGIES:  is allergic to adhesive; latex; percocet; shellfish allergy; soma; and vancomycin.  MEDICATIONS:  Current Outpatient Prescriptions  Medication Sig Dispense Refill  . albuterol (PROVENTIL HFA;VENTOLIN HFA) 108 (90 BASE) MCG/ACT inhaler Inhale 2 puffs into the lungs every 6 (six) hours as needed for wheezing or shortness of breath.     . ALPRAZolam (XANAX) 1 MG tablet Take 1 mg by mouth 2 (two) times daily as needed (for seizures).     . butalbital-acetaminophen-caffeine (FIORICET) 50-325-40 MG per tablet Take 1 tablet by mouth every 6 (six) hours as needed for headache. 90 tablet 0  . carisoprodol (SOMA) 350 MG tablet Take 350 mg by mouth 4 (four) times daily. Takes with Keppra for seizure prevention.    . dexamethasone (DECADRON) 4 MG tablet Take 1 tablet (4 mg total) by mouth 2 (two) times daily with a meal. (Patient taking differently: Take 4 mg by mouth as needed. Takes 1/2 tablet daily) 30 tablet 0  . divalproex (DEPAKOTE ER) 250 MG 24 hr tablet Take 2 tablets every am and 3 tablets every pm 150 tablet 3  . gabapentin (NEURONTIN) 300 MG capsule Take 1 capsule (300 mg total) by mouth 3 (three) times daily. 270 capsule 3  . HYDROmorphone (DILAUDID) 8 MG tablet Take 1 tablet (8 mg total) by mouth every 6 (six) hours as needed for severe pain. 90 tablet 0  . morphine (MS CONTIN) 30 MG 12 hr tablet Take 1 tablet (30 mg total) by mouth every 12 (  twelve) hours. 60 tablet 0  . ondansetron (ZOFRAN) 8 MG tablet Take 1 tablet (8 mg total) by mouth every 8 (eight) hours as needed for nausea or vomiting. 30 tablet 3  . temozolomide (TEMODAR) 180 MG capsule Take 1 capsule (180 mg total) by mouth See admin  instructions. Takes 2 capsules daily for 5 days, then stops for 23 days, then repeats. 5 capsule 6   No current facility-administered medications for this visit.    PHYSICAL EXAMINATION: ECOG PERFORMANCE STATUS: 0 - Asymptomatic  Filed Vitals:   07/17/14 1250  BP: 124/61  Pulse: 97  Temp: 98.1 F (36.7 C)  Resp: 18   Filed Weights   07/17/14 1250  Weight: 165 lb 4.8 oz (74.98 kg)    GENERAL:alert, no distress and comfortable SKIN: skin color, texture, turgor are normal, no rashes or significant lesions EYES: normal, Conjunctiva are pink and non-injected, sclera clear Musculoskeletal:no cyanosis of digits and no clubbing  NEURO: alert & oriented x 3 with fluent speech, no focal motor/sensory deficits  LABORATORY DATA:  I have reviewed the data as listed    Component Value Date/Time   NA 140 07/14/2014 1131   NA 138 01/27/2014 0611   K 4.8 07/14/2014 1131   K 4.4 01/27/2014 0611   CL 99 01/27/2014 0611   CO2 27 07/14/2014 1131   CO2 26 01/27/2014 0611   GLUCOSE 85 07/14/2014 1131   GLUCOSE 115* 01/27/2014 0611   BUN 7.7 07/14/2014 1131   BUN 8 01/27/2014 0611   CREATININE 0.7 07/14/2014 1131   CREATININE 0.48* 01/27/2014 0611   CALCIUM 9.1 07/14/2014 1131   CALCIUM 9.0 01/27/2014 0611   PROT 6.4 07/14/2014 1131   PROT 6.9 09/20/2013 0455   ALBUMIN 3.7 07/14/2014 1131   ALBUMIN 3.9 09/20/2013 0455   AST 16 07/14/2014 1131   AST 12 09/20/2013 0455   ALT 19 07/14/2014 1131   ALT 8 09/20/2013 0455   ALKPHOS 45 07/14/2014 1131   ALKPHOS 40 09/20/2013 0455   BILITOT 0.39 07/14/2014 1131   BILITOT 0.7 09/20/2013 0455   GFRNONAA >90 01/27/2014 0611   GFRAA >90 01/27/2014 0611    No results found for: SPEP, UPEP  Lab Results  Component Value Date   WBC 4.2 07/14/2014   NEUTROABS 2.7 07/14/2014   HGB 13.0 07/14/2014   HCT 39.1 07/14/2014   MCV 96.6 07/14/2014   PLT 227 07/14/2014      Chemistry      Component Value Date/Time   NA 140 07/14/2014 1131    NA 138 01/27/2014 0611   K 4.8 07/14/2014 1131   K 4.4 01/27/2014 0611   CL 99 01/27/2014 0611   CO2 27 07/14/2014 1131   CO2 26 01/27/2014 0611   BUN 7.7 07/14/2014 1131   BUN 8 01/27/2014 0611   CREATININE 0.7 07/14/2014 1131   CREATININE 0.48* 01/27/2014 0611      Component Value Date/Time   CALCIUM 9.1 07/14/2014 1131   CALCIUM 9.0 01/27/2014 0611   ALKPHOS 45 07/14/2014 1131   ALKPHOS 40 09/20/2013 0455   AST 16 07/14/2014 1131   AST 12 09/20/2013 0455   ALT 19 07/14/2014 1131   ALT 8 09/20/2013 0455   BILITOT 0.39 07/14/2014 1131   BILITOT 0.7 09/20/2013 0455       RADIOGRAPHIC STUDIES: MRI of the head is reviewed with the patient I have personally reviewed the radiological images as listed and agreed with the findings in the report.  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 She will resume Temodar next week on 07/21/2014. I will continue to see her on a monthly basis.   Intractable chronic cluster headache She had recent injection by her PCP with slight improvement of headache. She is also taking dexamethasone prn. She is taking pain medication rally and continue to take Fioricet as needed. This is stable.   Simple partial seizures She is pleased with her current anti-seizure management by her neurologist. MRI is negative for recurrence. She is reassured.   No orders of the defined types were placed in this encounter.   All questions were answered. The patient knows to call the clinic with any problems, questions or concerns. No barriers to learning was detected. I spent 15 minutes counseling the patient face to face. The total time spent in the appointment was 20 minutes and more than 50% was on counseling and review of test results     [facility], [place], MD 07/17/2014 1:40 PM

## 2014-07-17 NOTE — Assessment & Plan Note (Signed)
She is pleased with her current anti-seizure management by her neurologist. MRI is negative for recurrence. She is reassured.

## 2014-07-19 ENCOUNTER — Other Ambulatory Visit: Payer: Self-pay | Admitting: Neurology

## 2014-07-23 ENCOUNTER — Other Ambulatory Visit: Payer: Self-pay | Admitting: *Deleted

## 2014-07-23 DIAGNOSIS — G44011 Episodic cluster headache, intractable: Secondary | ICD-10-CM

## 2014-07-23 DIAGNOSIS — C711 Malignant neoplasm of frontal lobe: Secondary | ICD-10-CM

## 2014-07-23 DIAGNOSIS — C719 Malignant neoplasm of brain, unspecified: Secondary | ICD-10-CM

## 2014-07-23 MED ORDER — BUTALBITAL-APAP-CAFFEINE 50-325-40 MG PO TABS: 1.0000 | ORAL_TABLET | Freq: Four times a day (QID) | ORAL | Status: DC | PRN

## 2014-07-23 MED ORDER — GABAPENTIN 300 MG PO CAPS: 300.0000 mg | ORAL_CAPSULE | Freq: Three times a day (TID) | ORAL | Status: DC

## 2014-07-23 MED ORDER — MORPHINE SULFATE ER 30 MG PO TBCR: 30.0000 mg | EXTENDED_RELEASE_TABLET | Freq: Two times a day (BID) | ORAL | Status: DC

## 2014-07-23 NOTE — Telephone Encounter (Signed)
Pt left VM yesterday afternoon requests refill on ms contin, Fioricet and gabapentin.   Rx ready to pick up.  Called pt and left her VM informing her of refills done and ready to pick up.

## 2014-07-28 ENCOUNTER — Ambulatory Visit (INDEPENDENT_AMBULATORY_CARE_PROVIDER_SITE_OTHER): Payer: Medicaid Other | Admitting: Neurology

## 2014-07-28 DIAGNOSIS — G40109 Localization-related (focal) (partial) symptomatic epilepsy and epileptic syndromes with simple partial seizures, not intractable, without status epilepticus: Secondary | ICD-10-CM

## 2014-07-31 ENCOUNTER — Encounter (HOSPITAL_COMMUNITY): Payer: Self-pay | Admitting: Neurosurgery

## 2014-08-05 ENCOUNTER — Encounter: Payer: Self-pay | Admitting: Neurology

## 2014-08-05 ENCOUNTER — Ambulatory Visit (INDEPENDENT_AMBULATORY_CARE_PROVIDER_SITE_OTHER): Payer: Medicaid Other | Admitting: Neurology

## 2014-08-05 VITALS — BP 128/90 | HR 112 | Resp 16 | Ht 69.0 in | Wt 168.0 lb

## 2014-08-05 DIAGNOSIS — G40109 Localization-related (focal) (partial) symptomatic epilepsy and epileptic syndromes with simple partial seizures, not intractable, without status epilepticus: Secondary | ICD-10-CM

## 2014-08-05 NOTE — Progress Notes (Signed)
NEUROLOGY FOLLOW UP OFFICE NOTE  Victoria Bates [id number]  HISTORY OF PRESENT ILLNESS: I had the pleasure of seeing Victoria Bates in follow-up in the neurology clinic on 08/05/2014.  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 I personally reviewed most recent MRI brain with and without contrast 06/2014 which showed post-op changes in the right frontal region, improvement in the surrouding white matter edema, no recurrent enhancing lesion. Her 48-hour EEG did not show any epileptiform discharges. It showed breach artifact consistent with prior surgery. She reported numerous symptoms including headache, severe muscle spasm under the base of her skull on the right side, tremors in both hands, jerking and pause in jaw (right side), left leg/inner thigh and left foot warm and pigmented, eyes twitching, fatigue, with no associated epileptiform correlate seen.  Since her last visit, she has not been doing so well. She has been tired and has gained weight with Depakote. Last level in November 2015 was 32. She reports an episode on 08/03/14 where she does not recall much of the events but felt her heart racing, then her vision went out. She apparently stuck her left hand in the fire and did not feel it because she was numb on her left side. She has been having more headaches and took Fioricet, Decadron and morphine yesterday. She has also been having pain in her right leg with redness lasting for several hours.  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 She was admitted to [facility] last 09/19/2013 due to left vision changes and left facial heaviness. She reports that she had a 60-lb unintentional weight loss 4 months leading up to her admission, she was vomiting after every meal. She  started having episodes where her hands would have low amplitude tremors and she feels the right lower face twitching. She would need to pause and take a minute to get words out. If she did not stop what she was doing, the symptoms could last for hours. She would feel like her body was "going 90 mph" with palpitations up to 200 bpm. She was started on Xanax for anxiety, but this did not help. She then started having vision changes on her left eye, she would park crookedly when she thought she was going straight. She saw an ophthalmologist, eye exam was normal but left optic neuritis was questioned and brain imaging recommended. She then started to have left facial heaviness and numbness on the left face, arm, and leg, and drove herself to [facility] where she was found to have a right frontal lesion. She underwent subtotal resection on 01/23/14 by Dr. Christella Noa and was started on Keppra for seizure prophylaxis at that time. She continued to have the recurrent episodes of tremors in both arms, palpitations, and right facial tightness, as well as episodes of left face, neck, shoulder, and arm numbness, going down the left thumb and index finger. She describes her major seizures as "all of that times 100." After the episodes, she would be tired for a few days, wanting to sleep, with no energy. She has noticed that her short term memory is "gone." Keppra dose had been increased up to 2 grams daily, and this had caused a change in personality. She had seen neurologist Dr. Leonie Man, who switched her to Depakote. She tapered off Keppra and  is currently on Depakote ER 528m BID with significant improvement in symptoms. She denies any side effects to the Depakote. She brings a calendar of her symptoms, and reports having a bigger episode on 10/26. Prior to this, she had seizures on 10/22 and 10/23, she was tired for several hours after and did not get out of bed. Last night she woke up drenched in sweat and took Xanax. She reports  episodes of confusion, she was driving last Monday and went into a parking lot. Her son asked her what they were doing there and she did not realize where they were and how they got there. She reports these milder episodes occur a couple of times a week, she has more severe ones of memory gaps around once a week. She denies any olfactory/gustatory hallucinations, deja vu, rising epigastric sensation, myoclonic jerks.  She has a history of recurrent headaches since her teenage years. Headaches are usually over the right temporal and occipital region, with sharp stabbing and throbbing pain, with a knot in her left neck region. There is occasional nausea, photo, and phonophobia. She was having these headaches around 1-2 times a week when younger, however since December 2014, headaches have been occurring around 2-3 times a week. She had been receiving injections in her neck for this. If she takes medication at the onset, they last a few hours, however they may last up to several days. She has found that taking Decadron for a couple of days has been the only helpful medication. She takes Fioricet rarely. Zomig, aspirin, and Tylenol were ineffective in the past. Her father and 1107year old son have headaches. She denies any diplopia, dysarthria, dysphagia, bowel/bladder dysfunction. She has chronic back pain and had been taking Soma qid for several years, with worsening of pain when she tried reducing this. She was started on Gabapentin for the seizures and back pain, she is unsure if this helps. She had been taking Ultram for the pain, and once stopping this, she noticed an improvement in the seizures as well.   Diagnostic Data: [facility] in [facility] right frontotemporal regions which is likely from breach artifact from prior craniotomy. There is intermittent right frontal sharp activity but no definite epileptiform activity. Impression: Mild right hemispheric focal cortical  irritability but no definitive epileptiform activity is noted.  I personally reviewed MRI brain with and without contrast done 03/13/14 which showed post-op right-sided craniotomy. The surgical cavity in the right frontal lobe is filled with fluid, with note of improvement in layering blood products since prior study. The study of 01/24/2014 showed restricted diffusion deep to the tumor. This is consistent with acute infarct which is resolving at this time. No acute infarct is present on today's study. No evidence of recurrent or residual tumor although the original tumor did not enhance. No mass effect or edema is present that would suggest recurrent tumor. Small amount of subarachnoid hemorrhage on the right again noted.  Epilepsy Risk Factors: Right frontal oligodendroglioma s/p subtotal resection. Otherwise she had a normal birth and early development. There is no history of febrile convulsions, CNS infections such as meningitis/encephalitis, or family history of seizures.  PAST MEDICAL HISTORY: Past Medical History  Diagnosis Date  . Liver hemangioma   . Anemia   . Headache(784.0)     migraines  . Anxiety   . Asthma     very mild- no inhaler use since July 2013  . Complication of anesthesia  takes longer for patient to go under anesthesia  . Brain tumor     Hx: of  . Infection     to right head incision  . Petit mal seizure status   . Dizziness   . Seizures     petit mal serizures most days  . Cancer 2003    skin  . Herniated disc   . Nausea alone 02/12/2014  . Dysphasia 02/12/2014  . Headache 02/12/2014  . Neuropathic pain 04/08/2014    MEDICATIONS: Current Outpatient Prescriptions on File Prior to Visit  Medication Sig Dispense Refill  . albuterol (PROVENTIL HFA;VENTOLIN HFA) 108 (90 BASE) MCG/ACT inhaler Inhale 2 puffs into the lungs every 6 (six) hours as needed for wheezing or shortness of breath.     . ALPRAZolam (XANAX) 1 MG tablet Take 1 mg by mouth 2 (two)  times daily as needed (for seizures).     . butalbital-acetaminophen-caffeine (FIORICET) 50-325-40 MG per tablet Take 1 tablet by mouth every 6 (six) hours as needed for headache. 90 tablet 0  . carisoprodol (SOMA) 350 MG tablet Take 350 mg by mouth 4 (four) times daily. Takes with Keppra for seizure prevention.    Marland Kitchen dexamethasone (DECADRON) 4 MG tablet Take 1 tablet (4 mg total) by mouth 2 (two) times daily with a meal. (Patient taking differently: Take 4 mg by mouth as needed. Takes 1/2 tablet daily) 30 tablet 0  . divalproex (DEPAKOTE ER) 250 MG 24 hr tablet Take 2 tablets every am and 3 tablets every pm 150 tablet 3  . gabapentin (NEURONTIN) 300 MG capsule Take 1 capsule (300 mg total) by mouth 3 (three) times daily. 270 capsule 3  . HYDROmorphone (DILAUDID) 8 MG tablet Take 1 tablet (8 mg total) by mouth every 6 (six) hours as needed for severe pain. 90 tablet 0  . morphine (MS CONTIN) 30 MG 12 hr tablet Take 1 tablet (30 mg total) by mouth every 12 (twelve) hours. 60 tablet 0  . ondansetron (ZOFRAN) 8 MG tablet Take 1 tablet (8 mg total) by mouth every 8 (eight) hours as needed for nausea or vomiting. 30 tablet 3  . temozolomide (TEMODAR) 180 MG capsule Take 1 capsule (180 mg total) by mouth See admin instructions. Takes 2 capsules daily (360 mg) for 5 days, then stops for 23 days, then repeats. 10 capsule 6   No current facility-administered medications on file prior to visit.    ALLERGIES: Allergies  Allergen Reactions  . Adhesive [Tape] Other (See Comments)    Skin tear  . Latex Hives  . Percocet [Oxycodone-Acetaminophen] Other (See Comments)    makes pt angry  . Shellfish Allergy Diarrhea and Nausea And Vomiting  . Soma [Carisoprodol]     Watson brand, specifically, causes disorientation (can take other brands)  . Vancomycin Hives    FAMILY HISTORY: Family History  Problem Relation Age of Onset  . Hypertension Mother   . Skin cancer Mother   . Thyroid cancer Father   .  Skin cancer Father   . Breast cancer Maternal Aunt     dx in her 73s  . Prostate cancer Maternal Uncle 63  . Melanoma Maternal Uncle   . Ovarian cancer Maternal Grandmother     dx in her late 56s-40s  . Breast cancer Maternal Aunt     bilateral breast cancer dx in 79s and 40s; possibly BRCA+  . Other Son     multiple CAL spots, bright spots on brain MRI, ? chiari  malformation    SOCIAL HISTORY: History   Social History  . Marital Status: Single    Spouse Name: N/A    Number of Children: 2  . Years of Education: College   Occupational History  . laser tech    Social History Main Topics  . Smoking status: Never Smoker   . Smokeless tobacco: Never Used  . Alcohol Use: No  . Drug Use: No  . Sexual Activity: Yes    Birth Control/ Protection: None   Other Topics Concern  . Not on file   Social History Narrative   Patient lives at home with her two children.   Caffeine use: minimum of 4 cups daily    REVIEW OF SYSTEMS: Constitutional: No fevers, chills, or sweats, no generalized fatigue, change in appetite Eyes: No visual changes, double vision, eye pain Ear, nose and throat: No hearing loss, ear pain, nasal congestion, sore throat Cardiovascular: No chest pain, palpitations Respiratory:  No shortness of breath at rest or with exertion, wheezes GastrointestinaI: No nausea, vomiting, diarrhea, abdominal pain, fecal incontinence Genitourinary:  No dysuria, urinary retention or frequency Musculoskeletal:  No neck pain, back pain Integumentary: No rash, pruritus, skin lesions Neurological: as above Psychiatric: No depression, insomnia, +anxiety Endocrine: No palpitations, fatigue, diaphoresis, mood swings, change in appetite, change in weight, increased thirst Hematologic/Lymphatic:  No anemia, purpura, petechiae. Allergic/Immunologic: no itchy/runny eyes, nasal congestion, recent allergic reactions, rashes  PHYSICAL EXAM: Filed Vitals:   08/05/14 1018  BP: 128/90    Pulse: 112  Resp: 16   General: No acute distress Head:  Normocephalic/atraumatic Neck: supple, no paraspinal tenderness, full range of motion Heart:  Regular rate and rhythm Lungs:  Clear to auscultation bilaterally Back: No paraspinal tenderness Skin/Extremities: No rash, no edema Neurological Exam: alert and oriented to person, place, and time. No aphasia or dysarthria. Fund of knowledge is appropriate.  Recent and remote memory are intact.  Attention and concentration are normal.    Able to name objects and repeat phrases. Cranial nerves: Pupils equal, round, reactive to light.  Fundoscopic exam unremarkable, no papilledema. Extraocular movements intact with no nystagmus. Visual fields full. Facial sensation intact. No facial asymmetry. Tongue, uvula, palate midline.  Motor: Bulk and tone normal, muscle strength 5/5 throughout with no pronator drift.  Sensation to light touch  intact.  No extinction to double simultaneous stimulation.  Deep tendon reflexes 1+ throughout, toes downgoing.  Finger to nose testing intact.  Gait narrow-based and steady, able to tandem walk adequately.  Romberg negative.  IMPRESSION: This is a 34 yo RH woman with a history of grade 2 oligodengroglioma s/p subtotal resection on chemotherapy, anxiety, headaches, with recurrent episodes of low amplitude tremors, palpitations, and right facial tightening, as well as left face and arm numbness. She also reports episodes of gaps in time and significant memory changes. The episodes of left face and arm numbness and gaps in time are concerning for partial seizures, dose of Depakote was increased to 581m in AM, 7545min PM, however she is reporting more fatigue and weight gain. We discussed 48-hour EEG which did not show any epileptiform discharges. She reported numerous symptoms and wrote them down in 3 pages, I discussed hyperfocusing on her symptoms may be magnifying them. She continues to report seizures, we discussed  addition of a third AED such as Lamictal which may help with mood as well, however she is hesitant to be on more pills. She will increase gabapentin 30076mo 2 caps in AM,  2 caps in PM and monitor symptoms. She is aware of [place] driving laws to stop driving after an episode of loss of awareness/consciousness/seizure, until 6 months event-free. She will follow-up in 3 months.  Thank you for allowing me to participate in her care.  Please do not hesitate to call for any questions or concerns.  The duration of this appointment visit was 25 minutes of face-to-face time with the patient.  Greater than 50% of this time was spent in counseling, explanation of diagnosis, planning of further management, and coordination of care.   Ellouise Newer, M.D.   CC: Dr. Laurann Montana, Dr. Alvy Bimler

## 2014-08-05 NOTE — Patient Instructions (Signed)
1. Continue current dose of Depakote 2. Increase Gabapentin 300mg : Take 2 caps in the daytime, 2 caps at night 3. Follow-up in 3 months 4. As per [place] driving laws, one should not drive until 6 months seizure-free  Seizure Precautions: 1. If medication has been prescribed for you to prevent seizures, take it exactly as directed.  Do not stop taking the medicine without talking to your doctor first, even if you have not had a seizure in a long time.   2. Avoid activities in which a seizure would cause danger to yourself or to others.  Don't operate dangerous machinery, swim alone, or climb in high or dangerous places, such as on ladders, roofs, or girders.  Do not drive unless your doctor says you may.  3. If you have any warning that you may have a seizure, lay down in a safe place where you can't hurt yourself.    4.  No driving for 6 months from last seizure, as per [facility].   Please refer to the following link on the [place] website for more information: http://[email or web address].org/answerplace/Social/driving/drivingu.cfm   5.  Maintain good sleep hygiene.  6.  Notify your neurology if you are planning pregnancy or if you become pregnant.  7.  Contact your doctor if you have any problems that may be related to the medicine you are taking.  8.  Call 911 and bring the patient back to the ED if:        A.  The seizure lasts longer than 5 minutes.       B.  The patient doesn't awaken shortly after the seizure  C.  The patient has new problems such as difficulty seeing, speaking or moving  D.  The patient was injured during the seizure  E.  The patient has a temperature over 102 F (39C)  F.  The patient vomited and now is having trouble breathing

## 2014-08-06 ENCOUNTER — Telehealth: Payer: Self-pay | Admitting: Family Medicine

## 2014-08-06 NOTE — Telephone Encounter (Signed)
Left VM

## 2014-08-06 NOTE — Telephone Encounter (Signed)
Answered her questions regarding the EEG, no seizure activity. Discussed that the EEG findings (breach artifact) are changes that are consistent in patients who had had surgery in that region. She is concerned about the tumor and what the prognosis is, and is interested in going back to [facility] for these answers.

## 2014-08-06 NOTE — Telephone Encounter (Signed)
She wanted to know with the 48 hour EEG. If results would show exactly what area what she is having her seizure activity & also would it show what area she has muscle tension. Please Call.

## 2014-08-13 ENCOUNTER — Telehealth: Payer: Self-pay | Admitting: *Deleted

## 2014-08-13 ENCOUNTER — Other Ambulatory Visit: Payer: Self-pay | Admitting: *Deleted

## 2014-08-13 DIAGNOSIS — G44011 Episodic cluster headache, intractable: Secondary | ICD-10-CM

## 2014-08-13 DIAGNOSIS — C711 Malignant neoplasm of frontal lobe: Secondary | ICD-10-CM

## 2014-08-13 DIAGNOSIS — C719 Malignant neoplasm of brain, unspecified: Secondary | ICD-10-CM

## 2014-08-13 MED ORDER — BUTALBITAL-APAP-CAFFEINE 50-325-40 MG PO TABS
1.0000 | ORAL_TABLET | Freq: Four times a day (QID) | ORAL | Status: DC | PRN
Start: 2014-08-13 — End: 2014-09-22

## 2014-08-13 NOTE — Telephone Encounter (Signed)
Patient requested a refill of fioricet e-prescribed to pharmacy.

## 2014-08-15 ENCOUNTER — Telehealth: Payer: Self-pay | Admitting: Neurology

## 2014-08-15 ENCOUNTER — Other Ambulatory Visit: Payer: Self-pay | Admitting: Family Medicine

## 2014-08-15 MED ORDER — GABAPENTIN 300 MG PO CAPS: ORAL_CAPSULE | ORAL | Status: DC

## 2014-08-15 NOTE — Telephone Encounter (Signed)
New Rx was sent to patient's pharmacy.

## 2014-08-15 NOTE — Telephone Encounter (Signed)
[phone] Pt says Gabapentin dose has not been changed with her pharmacy. / Gayleen Orem.

## 2014-08-18 ENCOUNTER — Other Ambulatory Visit (HOSPITAL_BASED_OUTPATIENT_CLINIC_OR_DEPARTMENT_OTHER): Payer: Medicaid Other

## 2014-08-18 ENCOUNTER — Telehealth: Payer: Self-pay | Admitting: Hematology and Oncology

## 2014-08-18 ENCOUNTER — Ambulatory Visit (HOSPITAL_BASED_OUTPATIENT_CLINIC_OR_DEPARTMENT_OTHER): Payer: Medicaid Other | Admitting: Hematology and Oncology

## 2014-08-18 ENCOUNTER — Encounter: Payer: Self-pay | Admitting: Hematology and Oncology

## 2014-08-18 DIAGNOSIS — D701 Agranulocytosis secondary to cancer chemotherapy: Secondary | ICD-10-CM

## 2014-08-18 DIAGNOSIS — C711 Malignant neoplasm of frontal lobe: Secondary | ICD-10-CM

## 2014-08-18 DIAGNOSIS — R51 Headache: Secondary | ICD-10-CM

## 2014-08-18 DIAGNOSIS — R110 Nausea: Secondary | ICD-10-CM

## 2014-08-18 DIAGNOSIS — R079 Chest pain, unspecified: Secondary | ICD-10-CM

## 2014-08-18 DIAGNOSIS — R519 Headache, unspecified: Secondary | ICD-10-CM

## 2014-08-18 DIAGNOSIS — R4702 Dysphasia: Secondary | ICD-10-CM

## 2014-08-18 DIAGNOSIS — T451X5A Adverse effect of antineoplastic and immunosuppressive drugs, initial encounter: Secondary | ICD-10-CM

## 2014-08-18 DIAGNOSIS — R202 Paresthesia of skin: Secondary | ICD-10-CM

## 2014-08-18 DIAGNOSIS — G44021 Chronic cluster headache, intractable: Secondary | ICD-10-CM

## 2014-08-18 DIAGNOSIS — G40109 Localization-related (focal) (partial) symptomatic epilepsy and epileptic syndromes with simple partial seizures, not intractable, without status epilepticus: Secondary | ICD-10-CM

## 2014-08-18 LAB — COMPREHENSIVE METABOLIC PANEL (CC13)
ALT: 15 U/L (ref 0–55)
AST: 13 U/L (ref 5–34)
Albumin: 3.5 g/dL (ref 3.5–5.0)
Alkaline Phosphatase: 49 U/L (ref 40–150)
Anion Gap: 9 mEq/L (ref 3–11)
BUN: 10.6 mg/dL (ref 7.0–26.0)
CO2: 27 mEq/L (ref 22–29)
Calcium: 8.7 mg/dL (ref 8.4–10.4)
Chloride: 108 mEq/L (ref 98–109)
Creatinine: 0.7 mg/dL (ref 0.6–1.1)
EGFR: >90 mL/min/{1.73_m2} (ref >90)
Glucose: 80 mg/dl (ref 70–140)
Potassium: 4.7 mEq/L (ref 3.5–5.1)
Sodium: 143 mEq/L (ref 136–145)
Total Bilirubin: 0.25 mg/dL (ref 0.20–1.20)
Total Protein: 6.1 g/dL — ABNORMAL LOW (ref 6.4–8.3)

## 2014-08-18 LAB — CBC WITH DIFFERENTIAL/PLATELET
BASO%: 1.4 % (ref 0.0–2.0)
Basophils Absolute: 0 10*3/uL (ref 0.0–0.1)
EOS%: 2.1 % (ref 0.0–7.0)
Eosinophils Absolute: 0.1 10*3/uL (ref 0.0–0.5)
HCT: 38.2 % (ref 34.8–46.6)
HGB: 12.5 g/dL (ref 11.6–15.9)
LYMPH%: 35.3 % (ref 14.0–49.7)
MCH: 31.2 pg (ref 25.1–34.0)
MCHC: 32.8 g/dL (ref 31.5–36.0)
MCV: 95.1 fL (ref 79.5–101.0)
MONO#: 0.3 10*3/uL (ref 0.1–0.9)
MONO%: 10.7 % (ref 0.0–14.0)
NEUT#: 1.5 10*3/uL (ref 1.5–6.5)
NEUT%: 50.5 % (ref 38.4–76.8)
Platelets: 193 10*3/uL (ref 145–400)
RBC: 4.01 10*6/uL (ref 3.70–5.45)
RDW: 11.3 % (ref 11.2–14.5)
WBC: 2.9 10*3/uL — ABNORMAL LOW (ref 3.9–10.3)
lymph#: 1 10*3/uL (ref 0.9–3.3)

## 2014-08-18 MED ORDER — HYDROMORPHONE HCL 8 MG PO TABS: 8.0000 mg | ORAL_TABLET | Freq: Four times a day (QID) | ORAL | Status: DC | PRN

## 2014-08-18 NOTE — Assessment & Plan Note (Signed)
She is pleased with her current anti-seizure management by her neurologist. Last MRI is negative for recurrence. She is reassured.

## 2014-08-18 NOTE — Progress Notes (Signed)
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   09/27/2013 Pathology Results Accession: [id number] pathology showed grade 2 oligodendroglioma with mutant IDH1 protein, ATRX, OLIG 2 expression. p53 is negative, low Ki-67 labeling index. 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   11/06/2013 Imaging Repeat CT scan of the head showed resolution of abscess.   01/23/2014 Surgery Dr. Christella Noa perform a subtotal gross resection of the tumor.   01/24/2014 Imaging Repeat imaging studies showed persistent and residual disease.   03/03/2014 -  Chemotherapy She was started on Temodar, 5 days every 28 days   03/13/2014 Imaging Repeat MRI showed no evidence of progression of disease.   07/14/2014 Imaging Repeat MRI showed no evidence of disease progression.    INTERVAL HISTORY: Please see below for problem oriented charting. She is seen prior to the start of treatment. She is requesting delay of chemotherapy as she is taking care of her son. She continues to have intermittent seizures and intractable headache at times. Her medication was further adjusted by neurologist recently.  REVIEW OF SYSTEMS:   Constitutional: Denies fevers, chills or abnormal weight loss Eyes: Denies blurriness of vision Ears, nose, mouth, throat, and face: Denies mucositis or sore throat Respiratory: Denies cough, dyspnea or wheezes Cardiovascular: Denies palpitation, chest discomfort or lower extremity swelling Gastrointestinal:  Denies nausea, heartburn or change in bowel habits Skin: Denies abnormal skin  rashes Lymphatics: Denies new lymphadenopathy or easy bruising Neurological:Denies numbness, tingling or new weaknesses Behavioral/Psych: Mood is stable, no new changes  All other systems were reviewed with the patient and are negative.  I have reviewed the past medical history, past surgical history, social history and family history with the patient and they are unchanged from previous note.  ALLERGIES:  is allergic to adhesive; latex; percocet; shellfish allergy; soma; and vancomycin.  MEDICATIONS:  Current Outpatient Prescriptions  Medication Sig Dispense Refill  . albuterol (PROVENTIL HFA;VENTOLIN HFA) 108 (90 BASE) MCG/ACT inhaler Inhale 2 puffs into the lungs every 6 (six) hours as needed for wheezing or shortness of breath.     . ALPRAZolam (XANAX) 1 MG tablet Take 1 mg by mouth 2 (two) times daily as needed (for seizures).     . butalbital-acetaminophen-caffeine (FIORICET) 50-325-40 MG per tablet Take 1 tablet by mouth every 6 (six) hours as needed for headache. 90 tablet 0  . carisoprodol (SOMA) 350 MG tablet Take 350 mg by mouth 4 (four) times daily. Takes with Keppra for seizure prevention.    Marland Kitchen dexamethasone (DECADRON) 4 MG tablet Take 1 tablet (4 mg total) by mouth 2 (two) times daily with a meal. (Patient taking differently: Take 4 mg by mouth as needed. Takes 1/2 tablet daily) 30 tablet 0  . divalproex (DEPAKOTE ER) 250 MG 24 hr tablet Take 2 tablets every am and 3 tablets every pm 150 tablet 3  . gabapentin (NEURONTIN) 300 MG capsule Take 2 capsules in the morning and 2 capsules in the evening 120 capsule 3  . HYDROmorphone (DILAUDID) 8 MG tablet Take 1 tablet (8 mg total) by mouth every 6 (six) hours as needed for severe pain. 60 tablet 0  .  morphine (MS CONTIN) 30 MG 12 hr tablet Take 1 tablet (30 mg total) by mouth every 12 (twelve) hours. 60 tablet 0  . ondansetron (ZOFRAN) 8 MG tablet Take 1 tablet (8 mg total) by mouth every 8 (eight) hours as needed for nausea or  vomiting. 30 tablet 3  . temozolomide (TEMODAR) 180 MG capsule Take 1 capsule (180 mg total) by mouth See admin instructions. Takes 2 capsules daily (360 mg) for 5 days, then stops for 23 days, then repeats. 10 capsule 6   No current facility-administered medications for this visit.    PHYSICAL EXAMINATION: ECOG PERFORMANCE STATUS: 0 - Asymptomatic  There were no vitals filed for this visit. There were no vitals filed for this visit.  GENERAL:alert, no distress and comfortable SKIN: skin color, texture, turgor are normal, no rashes or significant lesions EYES: normal, Conjunctiva are pink and non-injected, sclera clear OROPHARYNX:no exudate, no erythema and lips, buccal mucosa, and tongue normal  NECK: supple, thyroid normal size, non-tender, without nodularity LYMPH:  no palpable lymphadenopathy in the cervical, axillary or inguinal LUNGS: clear to auscultation and percussion with normal breathing effort HEART: regular rate & rhythm and no murmurs and no lower extremity edema ABDOMEN:abdomen soft, non-tender and normal bowel sounds Musculoskeletal:no cyanosis of digits and no clubbing  NEURO: alert & oriented x 3 with fluent speech, no focal motor/sensory deficits  LABORATORY DATA:  I have reviewed the data as listed    Component Value Date/Time   NA 143 08/18/2014 1224   NA 138 01/27/2014 0611   K 4.7 08/18/2014 1224   K 4.4 01/27/2014 0611   CL 99 01/27/2014 0611   CO2 27 08/18/2014 1224   CO2 26 01/27/2014 0611   GLUCOSE 80 08/18/2014 1224   GLUCOSE 115* 01/27/2014 0611   BUN 10.6 08/18/2014 1224   BUN 8 01/27/2014 0611   CREATININE 0.7 08/18/2014 1224   CREATININE 0.48* 01/27/2014 0611   CALCIUM 8.7 08/18/2014 1224   CALCIUM 9.0 01/27/2014 0611   PROT 6.1* 08/18/2014 1224   PROT 6.9 09/20/2013 0455   ALBUMIN 3.5 08/18/2014 1224   ALBUMIN 3.9 09/20/2013 0455   AST 13 08/18/2014 1224   AST 12 09/20/2013 0455   ALT 15 08/18/2014 1224   ALT 8 09/20/2013 0455    ALKPHOS 49 08/18/2014 1224   ALKPHOS 40 09/20/2013 0455   BILITOT 0.25 08/18/2014 1224   BILITOT 0.7 09/20/2013 0455   GFRNONAA >90 01/27/2014 0611   GFRAA >90 01/27/2014 0611    No results found for: SPEP, UPEP  Lab Results  Component Value Date   WBC 2.9* 08/18/2014   NEUTROABS 1.5 08/18/2014   HGB 12.5 08/18/2014   HCT 38.2 08/18/2014   MCV 95.1 08/18/2014   PLT 193 08/18/2014      Chemistry      Component Value Date/Time   NA 143 08/18/2014 1224   NA 138 01/27/2014 0611   K 4.7 08/18/2014 1224   K 4.4 01/27/2014 0611   CL 99 01/27/2014 0611   CO2 27 08/18/2014 1224   CO2 26 01/27/2014 0611   BUN 10.6 08/18/2014 1224   BUN 8 01/27/2014 0611   CREATININE 0.7 08/18/2014 1224   CREATININE 0.48* 01/27/2014 0611      Component Value Date/Time   CALCIUM 8.7 08/18/2014 1224   CALCIUM 9.0 01/27/2014 0611   ALKPHOS 49 08/18/2014 1224   ALKPHOS 40 09/20/2013 0455   AST 13 08/18/2014 1224   AST 12 09/20/2013 0455   ALT  15 08/18/2014 1224   ALT 8 09/20/2013 0455   BILITOT 0.25 08/18/2014 1224   BILITOT 0.7 09/20/2013 0455      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 She requested to delay of initiation of Temodar this month by 1 week due to her son needing surgery and further care. I think this is reasonable especially with mild leukopenia. She will proceed with her treatment on 08/25/2014. I plan to reorder MRI scan on September 19, 2014 and see her back the following week to assess response to treatment.   Intractable chronic cluster headache She had recent injection by her PCP with slight improvement of headache. She is also taking dexamethasone prn. She is taking pain medication rally and continue to take Fioricet as needed. This is stable.   Leukopenia due to antineoplastic chemotherapy This is likely due to  recent treatment. The patient denies recent history of fevers, cough, chills, diarrhea or dysuria. She is asymptomatic from the leukopenia. I will observe for now.    Simple partial seizures She is pleased with her current anti-seizure management by her neurologist. Last MRI is negative for recurrence. She is reassured.    Orders Placed This Encounter  Procedures  . MR Brain W Contrast    Standing Status: Future     Number of Occurrences:      Standing Expiration Date: 10/18/2015    Order Specific Question:  Reason for Exam (SYMPTOM  OR DIAGNOSIS REQUIRED)    Answer:  brain tumor, assess response to Rx    Order Specific Question:  Is the patient pregnant?    Answer:  No    Order Specific Question:  Preferred imaging location?    Answer:  [facility]    Order Specific Question:  Does the patient have a pacemaker or implanted devices?    Answer:  No    Order Specific Question:  What is the patient's sedation requirement?    Answer:  No Sedation   All questions were answered. The patient knows to call the clinic with any problems, questions or concerns. No barriers to learning was detected. I spent 25 minutes counseling the patient face to face. The total time spent in the appointment was 30 minutes and more than 50% was on counseling and review of test results     [facility], Taevin Mcferran, MD 08/18/2014 2:07 PM

## 2014-08-18 NOTE — Telephone Encounter (Signed)
Pt confirmed labs/ov per 02/01 POF, gave pt AVS.... KJ °

## 2014-08-18 NOTE — Assessment & Plan Note (Signed)
This is likely due to recent treatment. The patient denies recent history of fevers, cough, chills, diarrhea or dysuria. She is asymptomatic from the leukopenia. I will observe for now.    

## 2014-08-18 NOTE — Assessment & Plan Note (Signed)
She had recent injection by her PCP with slight improvement of headache. She is also taking dexamethasone prn. She is taking pain medication rally and continue to take Fioricet as needed. This is stable.

## 2014-08-18 NOTE — Assessment & Plan Note (Signed)
Overall, she tolerated treatment well up from very mild leukopenia. She requested to delay of initiation of Temodar this month by 1 week due to her son needing surgery and further care. I think this is reasonable especially with mild leukopenia. She will proceed with her treatment on 08/25/2014. I plan to reorder MRI scan on September 19, 2014 and see her back the following week to assess response to treatment.

## 2014-08-20 NOTE — Telephone Encounter (Signed)
Yes

## 2014-08-20 NOTE — Telephone Encounter (Signed)
Can encounter be closed?

## 2014-08-22 ENCOUNTER — Other Ambulatory Visit: Payer: Self-pay | Admitting: Hematology and Oncology

## 2014-08-22 ENCOUNTER — Telehealth: Payer: Self-pay | Admitting: *Deleted

## 2014-08-22 ENCOUNTER — Encounter: Payer: Self-pay | Admitting: Hematology and Oncology

## 2014-08-22 DIAGNOSIS — K769 Liver disease, unspecified: Secondary | ICD-10-CM | POA: Insufficient documentation

## 2014-08-22 HISTORY — DX: Liver disease, unspecified: K76.9

## 2014-08-22 NOTE — Telephone Encounter (Signed)
I placed order for MRI abdomen to be done on 2/29. She should receive a call from radiology department

## 2014-08-22 NOTE — Telephone Encounter (Signed)
1)PT. HAS A MRI OF THE ABDOMEN TO MONITOR LIVER LESIONS. THE LAST MRI WAS 07/2013. SHE ASKED IF A MRI OF THE ABDOMEN TO EVALUATE THE LIVER LESIONS COULD BE DONE. PT. SAID IT COULD BE DONE ON 09/15/14 AT 11:45. 2)SHE WOULD LIKE LAB APPOINTMENT MOVED TO 09/15/14 IF MRI OF ABDOMEN IS DONE 09/15/14. 3)SINCE WBC HAS BEEN LOW COULD BLOOD WORK BE DONE NEXT WEEK BEFORE SHE STARTS THE TEMODAR.

## 2014-08-25 ENCOUNTER — Telehealth: Payer: Self-pay | Admitting: *Deleted

## 2014-08-25 NOTE — Telephone Encounter (Signed)
Left patient a message that Dr Alvy Bimler ordered an MRI of the abdomen to be done on 2/29. Should be receiving a call from radiology

## 2014-08-28 ENCOUNTER — Telehealth: Payer: Self-pay

## 2014-08-28 DIAGNOSIS — C711 Malignant neoplasm of frontal lobe: Secondary | ICD-10-CM

## 2014-08-28 DIAGNOSIS — C719 Malignant neoplasm of brain, unspecified: Secondary | ICD-10-CM

## 2014-08-28 DIAGNOSIS — G44011 Episodic cluster headache, intractable: Secondary | ICD-10-CM

## 2014-08-28 MED ORDER — MORPHINE SULFATE ER 30 MG PO TBCR: 30.0000 mg | EXTENDED_RELEASE_TABLET | Freq: Two times a day (BID) | ORAL | Status: DC

## 2014-08-28 NOTE — Telephone Encounter (Signed)
Pt requesting refill on MS contin, has one pill left.  She restarted her Temodar yesterday.  Informed pt we will have Rx for her or husband to pick up today.  She verbalized understanding. Rx signed by Awilda Metro PA in Dr. Calton Dach absence.   Ready for pick up.

## 2014-08-28 NOTE — Telephone Encounter (Signed)
Pt called stating she has been talking with Cameo about restarting her chemo. She states she will need her extended release pain medication refilled for when she is taking chemo. Forwarded to [employer].

## 2014-09-02 NOTE — Procedures (Signed)
ELECTROENCEPHALOGRAM REPORT  Dates of Recording: 07/28/2014 to 07/30/2014  Patient's Name: Victoria Bates MRN: [id number] Date of Birth: 1980-08-17  Referring Provider: Dr. Ellouise Newer  Procedure: 48-hour ambulatory EEG  History: 34 yo RH woman with a history of grade 2 oligodengroglioma s/p subtotal resection on chemotherapy, anxiety, headaches, with recurrent episodes of low amplitude tremors, palpitations, and right facial tightening, as well as left face and arm numbness  Medications: Depakote, Neurontin, Xanax, Dilaudid, MS Contin, Soma, Decadron  Technical Summary: This is a 48-hour multichannel digital EEG recording measured by the international 10-20 system with electrodes applied with paste and impedances below 5000 ohms performed as portable with EKG monitoring.  The digital EEG was referentially recorded, reformatted, and digitally filtered in a variety of bipolar and referential montages for optimal display.    DESCRIPTION OF RECORDING: During maximal wakefulness, the background activity consisted of a symmetric 10 Hz posterior dominant rhythm which was reactive to eye opening. There is occasional focal 5-7 Hz theta slowing over the right central region. Breach artifact with sharply contoured and higher amplitude activity is seen over the right parasagittal derivations. There were no epileptiform discharges seen in wakefulness.  During the recording, the patient progresses through wakefulness, drowsiness, and Stage 2 sleep. Similar occasional focal slowing is seen over the right central region.  Again, there were no epileptiform discharges seen.  Events: On 01/11 between 1030 to 1230 hours, patient reports headaches in the left tempole/severe "muscle spasm" or "knot" under the base of skull on the right side. Tremors of both hands, right side. Jerking and pause in jaw, right side affecting speech. Electrographically, there were no EEG or EKG changes seen.  On 01/11 at 1245  hours, patient reports tremors, headache, and tachycardia continue to progress. Electrographically, there were no EEG or EKG changes seen.  On 01/11 at 1600 hours, patient reports tremor. Electrographically, there were no EEG or EKG changes seen.  On 01/12 between 0830 to 0900 hours, patient reports progressing headache, tightness on left side of neck down to left flank, buttock and posterior thigh. Electrographically, there were no EEG or EKG changes seen.  On 01/12 at 0030 hours, patient reports headache behind left eye, left temple, tightness in muscles of left neck/back to buttock. Left leg and inner thigh and left foot warm and pigmented. Electrographically, there were no EEG or EKG changes seen.  On 01/12 at 0755 and 0810 hours, patient reports slight right eye twitch. Electrographically, there were no EEG or EKG changes seen.  On 01/12 at 0912 hours, patient reports tension of calf muscles, spasm predominant left big toe, heart racing from 10-15 minutes. Electrographically, there were no EEG or EKG changes seen.  On 01/12 at 1210 hours, patient reports feeling very "jittery" fast pulse, mild tremors in hands and slight jerk in speech. Electrographically, there were no EEG or EKG changes seen.  On 01/12 at 1715 hours, patient reports mild to moderate headache in left temple extending down neck on left side. Electrographically, there were no EEG or EKG changes seen.  There were no electrographic seizures seen.  EKG lead was unremarkable.  IMPRESSION: This 48-hour ambulatory EEG study is abnormal due to the presence of: 1. Focal slowing over the right central region 2. Breach artifact over the right parasagittal derivations  CLINICAL CORRELATION of the above findings indicates focal cerebral dysfunction over the right central region suggestive of underlying structural or physiologic abnormality consistent with patient's history. Breach artifact is consistent with prior surgery in this  region. There were no epileptiform discharges or electrographic seizures seen in this study. Episodes of headache, tremors in both hands, jerking and pause in jaw, tightness in muscles, right eye twitching, palpitations, were not associated with any EEG change. If further clinical questions remain, inpatient video EEG monitoring may be helpful.   Ellouise Newer, M.D.

## 2014-09-04 ENCOUNTER — Other Ambulatory Visit: Payer: Self-pay | Admitting: Hematology and Oncology

## 2014-09-05 ENCOUNTER — Telehealth: Payer: Self-pay | Admitting: *Deleted

## 2014-09-05 NOTE — Telephone Encounter (Signed)
She can drink caffeinated drinks and take her pain medications as needed for headache

## 2014-09-05 NOTE — Telephone Encounter (Signed)
Called patient with Dr. Alvy Bimler instructions to drink caffeinated beverages and take her current pain medicines of MS Contin and dilaudid.  This information left on voicemail identified as Victoria Bates.

## 2014-09-05 NOTE — Telephone Encounter (Signed)
Vylet called reporting 1. she received a letter from insurance in regards to MRI tests not being approved.  Would like to know if Dr. Alvy Bimler has sent lab information or whatever is needed to have these approved.  Concerned about bill to pay out of pocket.  2. Pharmacy has not received authorization to refill fioricet for migraines.  Informed her the refill is not authorized and would like to know what Dr. Alvy Bimler recommends she take.  I was told not to take a fever reducing agent so I need to know what to take. Will notify Dr. Alvy Bimler of Migraine requests.  Message also left on voicemail for Benedetto Goad with [employer].

## 2014-09-11 ENCOUNTER — Other Ambulatory Visit: Payer: Self-pay | Admitting: *Deleted

## 2014-09-11 DIAGNOSIS — C711 Malignant neoplasm of frontal lobe: Secondary | ICD-10-CM

## 2014-09-12 ENCOUNTER — Ambulatory Visit (HOSPITAL_COMMUNITY)
Admission: RE | Admit: 2014-09-12 | Discharge: 2014-09-12 | Disposition: A | Discharge status: Home / Self Care | Payer: Medicaid Other | Source: Ambulatory Visit | Attending: Hematology and Oncology | Admitting: Hematology and Oncology

## 2014-09-12 ENCOUNTER — Encounter: Payer: Self-pay | Admitting: Hematology and Oncology

## 2014-09-12 ENCOUNTER — Ambulatory Visit (HOSPITAL_COMMUNITY): Admission: RE | Admit: 2014-09-12 | Payer: Medicaid Other | Source: Ambulatory Visit

## 2014-09-12 DIAGNOSIS — C719 Malignant neoplasm of brain, unspecified: Secondary | ICD-10-CM | POA: Diagnosis not present

## 2014-09-12 DIAGNOSIS — K769 Liver disease, unspecified: Secondary | ICD-10-CM | POA: Diagnosis present

## 2014-09-12 DIAGNOSIS — C711 Malignant neoplasm of frontal lobe: Secondary | ICD-10-CM

## 2014-09-12 MED ORDER — GADOXETATE DISODIUM 0.25 MMOL/ML IV SOLN: 10.0000 mL | Freq: Once | INTRAVENOUS | Status: AC | PRN

## 2014-09-12 MED ORDER — GADOXETATE DISODIUM 0.25 MMOL/ML IV SOLN
7.0000 mL | Freq: Once | INTRAVENOUS | Status: AC | PRN
  Administered 2014-09-12: 10 mL via INTRAVENOUS

## 2014-09-12 NOTE — Progress Notes (Unsigned)
09/12/2014 I spoke with Estill Bamberg

## 2014-09-12 NOTE — Progress Notes (Unsigned)
09/12/2014 I spoke with the patient today RE: Abd Mri scheduled for today.  Victoria Bates expressed concern over the authorization of this procedure. I explained why the procedure was made emergency and she voiced understanding.  Benedetto Goad

## 2014-09-15 ENCOUNTER — Ambulatory Visit (HOSPITAL_COMMUNITY)
Admission: RE | Admit: 2014-09-15 | Discharge: 2014-09-15 | Disposition: A | Discharge status: Home / Self Care | Payer: Medicaid Other | Source: Ambulatory Visit | Attending: Hematology and Oncology | Admitting: Hematology and Oncology

## 2014-09-15 ENCOUNTER — Ambulatory Visit (HOSPITAL_COMMUNITY): Payer: Medicaid Other

## 2014-09-15 ENCOUNTER — Other Ambulatory Visit: Payer: Self-pay | Admitting: Hematology and Oncology

## 2014-09-15 ENCOUNTER — Telehealth: Payer: Self-pay | Admitting: Hematology and Oncology

## 2014-09-15 DIAGNOSIS — C711 Malignant neoplasm of frontal lobe: Secondary | ICD-10-CM

## 2014-09-15 DIAGNOSIS — C719 Malignant neoplasm of brain, unspecified: Secondary | ICD-10-CM | POA: Diagnosis present

## 2014-09-15 MED ORDER — GADOBENATE DIMEGLUMINE 529 MG/ML IV SOLN
15.0000 mL | Freq: Once | INTRAVENOUS | Status: AC | PRN
  Administered 2014-09-15: 15 mL via INTRAVENOUS

## 2014-09-15 NOTE — Telephone Encounter (Signed)
returned pt call and lvm to call back with d.t. to r/s

## 2014-09-16 ENCOUNTER — Telehealth: Payer: Self-pay | Admitting: Hematology and Oncology

## 2014-09-16 ENCOUNTER — Telehealth: Payer: Self-pay | Admitting: *Deleted

## 2014-09-16 NOTE — Telephone Encounter (Signed)
returned call and s.w. pt and r/s lab to same day as visit per pt request..Marland KitchenMarland KitchenMarland Kitchenpt ok and aware

## 2014-09-16 NOTE — Telephone Encounter (Signed)
Left Vm for pt informing her Dr. Alvy Bimler wanted Dr. Johny Shears opinion.   Asked pt to please call nurse back for more clarification.  Otherwise keep her appt on 3/7 as scheduled

## 2014-09-16 NOTE — Telephone Encounter (Signed)
Pt left VM asks why she was referred to see Dr. Tammi Klippel on 3/7?  She says she thought Dr. Alvy Bimler told her that radiation would be a "last ditch" treatment.  She just wants to know why the reason for her appt to see Dr. Tammi Klippel now?  She asks if she should bring someone with her on this appt as she usually comes alone.

## 2014-09-16 NOTE — Telephone Encounter (Signed)
I'm sorry I did not get a chance to review the scans with her. There is a question on her scans which may suggest disease and I just want Dr. Johny Shears opinion regarding that.

## 2014-09-18 ENCOUNTER — Encounter: Payer: Self-pay | Admitting: Radiation Oncology

## 2014-09-18 NOTE — Progress Notes (Signed)
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   07/14/2014 Imaging Repeat MRI showed no evidence of disease progression.         Dose of Decadron, if applicable: Taking decadron 4 mg by mouth prn  Recent neurologic symptoms, if any:   Seizures: yes  Headaches: yes  Nausea: denies  Dizziness/ataxia:   Difficulty with hand coordination: denies  Focal numbness/weakness: denies  Visual deficits/changes: denies  Confusion/Memory deficits: denies  Painful bone metastases at present, if any: no  SAFETY ISSUES:  Prior radiation? no  Pacemaker/ICD? no  Possible current pregnancy? no  Is the patient on methotrexate? no  Additional Complaints / other details: 34 year old female. Alvy Bimler is question recent MRI scan that "may suggest disease." Alvy Bimler sending patient back for an opinion from Dr. Tammi Klippel.

## 2014-09-19 ENCOUNTER — Other Ambulatory Visit: Payer: Medicaid Other

## 2014-09-22 ENCOUNTER — Other Ambulatory Visit (HOSPITAL_BASED_OUTPATIENT_CLINIC_OR_DEPARTMENT_OTHER): Payer: Medicaid Other

## 2014-09-22 ENCOUNTER — Encounter: Payer: Self-pay | Admitting: Radiation Oncology

## 2014-09-22 ENCOUNTER — Ambulatory Visit
Admission: RE | Admit: 2014-09-22 | Discharge: 2014-09-22 | Disposition: A | Discharge status: Home / Self Care | Payer: Medicaid Other | Source: Ambulatory Visit | Attending: Radiation Oncology | Admitting: Radiation Oncology

## 2014-09-22 ENCOUNTER — Ambulatory Visit (HOSPITAL_BASED_OUTPATIENT_CLINIC_OR_DEPARTMENT_OTHER): Payer: Medicaid Other | Admitting: Hematology and Oncology

## 2014-09-22 VITALS — BP 123/81 | HR 94 | Temp 97.6°F | Resp 18 | Ht 69.0 in | Wt 179.1 lb

## 2014-09-22 VITALS — BP 123/81 | HR 94 | Temp 97.6°F | Resp 18 | Wt 179.0 lb

## 2014-09-22 DIAGNOSIS — K7689 Other specified diseases of liver: Secondary | ICD-10-CM

## 2014-09-22 DIAGNOSIS — C719 Malignant neoplasm of brain, unspecified: Secondary | ICD-10-CM

## 2014-09-22 DIAGNOSIS — C711 Malignant neoplasm of frontal lobe: Secondary | ICD-10-CM

## 2014-09-22 DIAGNOSIS — K769 Liver disease, unspecified: Secondary | ICD-10-CM

## 2014-09-22 DIAGNOSIS — R51 Headache: Secondary | ICD-10-CM

## 2014-09-22 DIAGNOSIS — R079 Chest pain, unspecified: Secondary | ICD-10-CM

## 2014-09-22 DIAGNOSIS — G40109 Localization-related (focal) (partial) symptomatic epilepsy and epileptic syndromes with simple partial seizures, not intractable, without status epilepticus: Secondary | ICD-10-CM

## 2014-09-22 DIAGNOSIS — G44011 Episodic cluster headache, intractable: Secondary | ICD-10-CM

## 2014-09-22 DIAGNOSIS — R4702 Dysphasia: Secondary | ICD-10-CM

## 2014-09-22 DIAGNOSIS — R519 Headache, unspecified: Secondary | ICD-10-CM

## 2014-09-22 DIAGNOSIS — R110 Nausea: Secondary | ICD-10-CM

## 2014-09-22 DIAGNOSIS — G44021 Chronic cluster headache, intractable: Secondary | ICD-10-CM

## 2014-09-22 DIAGNOSIS — R202 Paresthesia of skin: Secondary | ICD-10-CM

## 2014-09-22 LAB — CBC WITH DIFFERENTIAL/PLATELET
BASO%: 1 % (ref 0.0–2.0)
Basophils Absolute: 0 10*3/uL (ref 0.0–0.1)
EOS%: 1.2 % (ref 0.0–7.0)
Eosinophils Absolute: 0.1 10*3/uL (ref 0.0–0.5)
HCT: 39.4 % (ref 34.8–46.6)
HGB: 12.6 g/dL (ref 11.6–15.9)
LYMPH%: 41.7 % (ref 14.0–49.7)
MCH: 30.6 pg (ref 25.1–34.0)
MCHC: 32.1 g/dL (ref 31.5–36.0)
MCV: 95.4 fL (ref 79.5–101.0)
MONO#: 0.5 10*3/uL (ref 0.1–0.9)
MONO%: 9.7 % (ref 0.0–14.0)
NEUT#: 2.2 10*3/uL (ref 1.5–6.5)
NEUT%: 46.4 % (ref 38.4–76.8)
Platelets: 219 10*3/uL (ref 145–400)
RBC: 4.13 10*6/uL (ref 3.70–5.45)
RDW: 11.9 % (ref 11.2–14.5)
WBC: 4.7 10*3/uL (ref 3.9–10.3)
lymph#: 2 10*3/uL (ref 0.9–3.3)

## 2014-09-22 LAB — COMPREHENSIVE METABOLIC PANEL (CC13)
ALT: 12 U/L (ref 0–55)
AST: 13 U/L (ref 5–34)
Albumin: 3.6 g/dL (ref 3.5–5.0)
Alkaline Phosphatase: 48 U/L (ref 40–150)
Anion Gap: 9 mEq/L (ref 3–11)
BUN: 12 mg/dL (ref 7.0–26.0)
CO2: 29 mEq/L (ref 22–29)
Calcium: 8.8 mg/dL (ref 8.4–10.4)
Chloride: 104 mEq/L (ref 98–109)
Creatinine: 0.7 mg/dL (ref 0.6–1.1)
EGFR: >90 mL/min/{1.73_m2} (ref >90)
Glucose: 89 mg/dl (ref 70–140)
Potassium: 4.1 mEq/L (ref 3.5–5.1)
Sodium: 143 mEq/L (ref 136–145)
Total Bilirubin: 0.47 mg/dL (ref 0.20–1.20)
Total Protein: 6.3 g/dL — ABNORMAL LOW (ref 6.4–8.3)

## 2014-09-22 MED ORDER — BUTALBITAL-APAP-CAFFEINE 50-325-40 MG PO TABS: 1.0000 | ORAL_TABLET | Freq: Four times a day (QID) | ORAL | Status: DC | PRN

## 2014-09-22 MED ORDER — MORPHINE SULFATE 30 MG PO TABS
30.0000 mg | ORAL_TABLET | Freq: Four times a day (QID) | ORAL | Status: DC | PRN
Start: 2014-09-22 — End: 2014-10-20

## 2014-09-22 NOTE — Progress Notes (Signed)
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  She has been receiving Temodar 5 days of every 28 since August 2015. Her recent MRI was presented at our neuro oncology conference earlier today. It shows persistent FLAIR signal abnormality surrounding her surgical resection site suggesting possible residual tumor. Her performance status remains excellent and she returns today to review the possibility of adjuvant radiotherapy versus further surgical resection.                              ALLERGIES:  is allergic to adhesive; latex; percocet; shellfish allergy; soma; and vancomycin.  Meds: Current Outpatient Prescriptions  Medication Sig Dispense Refill  . albuterol (PROVENTIL HFA;VENTOLIN HFA) 108 (90 BASE) MCG/ACT inhaler Inhale 2 puffs into the lungs every 6 (six) hours as needed for wheezing or shortness of breath.     . ALPRAZolam (XANAX) 1 MG tablet Take 1 mg by mouth 2 (two) times daily as needed (for seizures).     . butalbital-acetaminophen-caffeine (FIORICET) 50-325-40 MG per tablet Take 1 tablet by mouth every 6 (six) hours as needed for headache. 90 tablet 0  . carisoprodol (SOMA) 350 MG tablet Take 350 mg by mouth 4 (four) times daily. Takes with Keppra for seizure prevention.    . cetirizine (ZYRTEC) 10 MG tablet Take 10 mg by mouth daily.    Marland Kitchen dexamethasone (DECADRON) 4 MG tablet Take 1 tablet (4 mg total) by mouth 2 (two) times daily with a meal.  (Patient taking differently: Take 4 mg by mouth as needed. Takes 1/2 tablet daily) 30 tablet 0  . divalproex (DEPAKOTE ER) 250 MG 24 hr tablet Take 2 tablets every am and 3 tablets every pm 150 tablet 3  . gabapentin (NEURONTIN) 300 MG capsule Take 2 capsules in the morning and 2 capsules in the evening 120 capsule 3  . morphine (MSIR) 30 MG tablet Take 1 tablet (30 mg total) by mouth every 6 (six) hours as needed for severe pain. 90 tablet 0  . HYDROmorphone (DILAUDID) 8 MG tablet Take 1 tablet (8 mg total) by mouth every 6 (six) hours as needed for severe pain. (Patient not taking: Reported on 09/22/2014) 60 tablet 0  . ondansetron (ZOFRAN) 8 MG tablet Take 1 tablet (8 mg total) by mouth every 8 (eight) hours as needed for nausea or vomiting. (Patient not taking: Reported on 09/22/2014) 30 tablet 3  . temozolomide (TEMODAR) 180 MG capsule Take 1 capsule (180 mg total) by mouth See admin instructions. Takes 2 capsules daily (360 mg) for 5 days, then stops for 23 days, then repeats. (Patient not taking: Reported on 09/22/2014) 10 capsule 6   No current facility-administered medications for this encounter.    Physical Findings: The patient is in no acute distress. Patient is alert and oriented.  weight is 179 lb (81.194 kg). Her oral temperature is 97.6 F (36.4 C). Her blood pressure  is 123/81 and her pulse is 94. Her respiration is 18 and oxygen saturation is 100%. . She has no facial droop and no notable neurologic deficits No significant changes.  Lab Findings: Lab Results  Component Value Date   WBC 4.7 09/22/2014   WBC 7.5 01/27/2014   HGB 12.6 09/22/2014   HGB 10.6* 01/27/2014   HCT 39.4 09/22/2014   HCT 32.0* 01/27/2014   PLT 219 09/22/2014   PLT 222 01/27/2014    Lab Results  Component Value Date   NA 143 09/22/2014   NA 138 01/27/2014   K 4.1 09/22/2014   K 4.4 01/27/2014   CHLORIDE 104 09/22/2014   CO2 29 09/22/2014   CO2 26 01/27/2014   GLUCOSE 89 09/22/2014   GLUCOSE 115*  01/27/2014   BUN 12.0 09/22/2014   BUN 8 01/27/2014   CREATININE 0.7 09/22/2014   CREATININE 0.48* 01/27/2014   BILITOT 0.47 09/22/2014   BILITOT 0.7 09/20/2013   ALKPHOS 48 09/22/2014   ALKPHOS 40 09/20/2013   AST 13 09/22/2014   AST 12 09/20/2013   ALT 12 09/22/2014   ALT 8 09/20/2013   PROT 6.3* 09/22/2014   PROT 6.9 09/20/2013   ALBUMIN 3.6 09/22/2014   ALBUMIN 3.9 09/20/2013   CALCIUM 8.8 09/22/2014   CALCIUM 9.0 01/27/2014   ANIONGAP 9 09/22/2014   ANIONGAP 13 01/27/2014    Radiographic Findings: Mr Jeri Cos Wo Contrast  09/15/2014   CLINICAL DATA:  Low grade eyelid good antero glioma, grade 2. Previously treated with chemotherapy. Assess response to treatment. Headache, tremors any confusion. Difficulty walking and speaking. Left-sided numbness. Previous resection 01/2014.  EXAM: MRI HEAD WITHOUT AND WITH CONTRAST  TECHNIQUE: Multiplanar, multiecho pulse sequences of the brain and surrounding structures were obtained without and with intravenous contrast.  CONTRAST:  15 cc MultiHance  COMPARISON:  07/14/2014.  Multiple previous.  FINDINGS: Diffusion imaging does not show any acute or subacute infarction or other cause of restricted diffusion. No abnormality affects the brainstem, cerebellum or left cerebral hemisphere.  There has been previous right frontal craniotomy. There is brain resection in the right posterior frontal cortical and subcortical brain. In the region of resection, gliosis and hemosiderin deposition appears the same. There is a similar pattern of enlargement and abnormal T2 and FLAIR signal within the right frontal gyrus immediately superior to the resection. This is quite likely affected by residual tumor. The appearance is stable however.  No hydrocephalus. No evidence of acute hemorrhage. No unexpected extra-axial collection. No pituitary mass. No inflammatory sinus disease. Major vessels the base of the brain show flow. Mild postoperative dural enhancement on the  right as expected.  IMPRESSION: The region of resection appear stable and unremarkable. The right posterior frontal gyrus just superior to the resection is again enlarged and shows increased FLAIR and T2 signal consistent with residual tumor in that gyrus. There is no evidence of any progressive mass effect, vasogenic edema or contrast enhancement.   Electronically Signed   By: Nelson Chimes M.D.   On: 09/15/2014 13:23   Mr Abdomen W Contrast  09/13/2014   CLINICAL DATA:  Brain malignancy.  Evaluate enhancing liver lesions.  EXAM: MRI ABDOMEN WITHOUT CONTRAST  TECHNIQUE: Multiplanar multisequence MR imaging of the abdomen was performed after the administration of intravenous contrast.  COMPARISON:  MRI 09/18/2013, 02/08/2013  FINDINGS: Lower chest:  Lung bases are clear.  Hepatobiliary: Again demonstrated 4 enhancing lesions within the liver parenchyma. The largest lesion in the right hepatic lobe (segment  7) measures 19 mm x 18 mm (image 23, series 501) compared to 22 x 17 mm on MRI of 08/16/2012. This lesion is not as well circumscribed as on comparison exam bu t does enhance relatively uniformly. The smaller 6 mm lesion in the more superior right hepatic lobe on image 15. This lesion is decreased from 11 mm on prior. Small lesion measuring 5 mm in the left lateral hepatic lobe on image 38 and tiny lesion measuring 3 mm on image 37.  These lesions are difficult to detect on the more delayed vascular phases. On the 20 minutes delay sequence, the lesion partially accumulates some of the contrast agent within the largest lesion (image 30, series 14) but not to the same extent as the adjacent normal liver parenchyma. The other small lesions are difficult to detect.  Pancreas: Normal pancreas.  No duct dilatation.  Spleen: Normal spleen.  Adrenals/urinary tract: Adrenal glands are normal. Kidneys are normal.  Stomach/Bowel: Small bowel, stomach and colon are unremarkable.  Vascular/Lymphatic: No abdominal adenopathy.   Normal aorta.  Musculoskeletal: No aggressive osseous lesion.  IMPRESSION: 1. For enhancing lesion again demonstrated within the liver. The 2 largest lesions are decreased in size which would be very unusual for malignancy unless this was a treatment response. Atypical for focal nodular hyperplasia to decrease in size also unless patient stopped oral contraceptive. 2. While not completely typical for focal nodular hyperplasia, favor this diagnosis. 3. If continued concern for etiology of these lesions, consider biopsy of the larger lesion in the right hepatic lobe.   Electronically Signed   By: Suzy Bouchard M.D.   On: 09/13/2014 18:32    Impression:  The patient remains status post subtotal resection of a right frontal oligodendroglioma. Her resection was performed awake in order to minimize neurologic deficits such as left facial weakness and left upper extremity weakness. In retrospect, the patient feels that some level of left facial weakness may be acceptable if she could potentially achieve a gross total resection and avoid or at least delay localized radiotherapy. During our multidisciplinary conference earlier today, it was agreed that additional surgical resection would carry significant risks of neurologic deficits. The patient expressed some discontent regarding the current status of our decision about additional surgery versus radiation and would like to get an additional outside opinion. She has been seen by the brain tumor team at [facility] and asked whether this would be an appropriate time to get additional input prior to making a further treatment decision.  Plan:  I spent a proximal only 45 minutes today talking with the patient about her management and findings thus far. We talked about potential roles of radiation treatment and additional surgery. I explained that my input regarding possible surgery is quite limited and she would benefit from talking to a neurosurgeon about this situation.  However, I explained that the conversation would likely include a statement that further surgery is possible, but carries risk of significant neurologic deficits, and she understands that. However, she would like to learn more details about the pros and cons of surgery versus radiation at this juncture. The patient will recheck out and make contact with the [facility] brain tumor team to follow-up there to continue this discussion.  _____________________________________  Sheral Apley Tammi Klippel, M.D.

## 2014-09-22 NOTE — Progress Notes (Signed)
Reports intermittent seizure activity. Explain tremors, memory loss and stuttering occur while seizing. Reports taking depakote two tablets each morning and at night. Alvy Bimler encourages patient to see surgery at [facility] or [facility] should Dr. Tammi Klippel requesting pathology prior to the start of radiation therapy. Patient supposed to start Temodar tonight but, encouraged to question Dr. Tammi Klippel before doing so. Takes decadron rarely for unrelieved headaches with Fiorcet or Morphine. Patient requesting PET scan to evaluate liver lesions.

## 2014-09-23 ENCOUNTER — Encounter: Payer: Self-pay | Admitting: Hematology and Oncology

## 2014-09-23 ENCOUNTER — Telehealth: Payer: Self-pay | Admitting: *Deleted

## 2014-09-23 ENCOUNTER — Telehealth: Payer: Self-pay

## 2014-09-23 ENCOUNTER — Telehealth: Payer: Self-pay | Admitting: Hematology and Oncology

## 2014-09-23 DIAGNOSIS — C719 Malignant neoplasm of brain, unspecified: Secondary | ICD-10-CM

## 2014-09-23 DIAGNOSIS — C711 Malignant neoplasm of frontal lobe: Secondary | ICD-10-CM

## 2014-09-23 NOTE — Assessment & Plan Note (Signed)
She had recent injection by her PCP with slight improvement of headache. She is also taking dexamethasone prn. She is taking pain medication rally and continue to take Fioricet as needed. This is stable. I refill her medications today.

## 2014-09-23 NOTE — Telephone Encounter (Signed)
lvm we faxed notes to Dr Tommi Rumps and requested a second opinion/official consult.

## 2014-09-23 NOTE — Assessment & Plan Note (Signed)
She is pleased with her current anti-seizure management by her neurologist.

## 2014-09-23 NOTE — Telephone Encounter (Signed)
Left message in regards to referral. See notes.

## 2014-09-23 NOTE — Progress Notes (Signed)
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   09/27/2013 Pathology Results Accession: [id number] pathology showed grade 2 oligodendroglioma with mutant IDH1 protein, ATRX, OLIG 2 expression. p53 is negative, low Ki-67 labeling index. 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   11/06/2013 Imaging Repeat CT scan of the head showed resolution of abscess.   01/23/2014 Surgery Dr. Christella Noa perform a subtotal gross resection of the tumor.   01/24/2014 Imaging Repeat imaging studies showed persistent and residual disease.   03/03/2014 -  Chemotherapy She was started on Temodar, 5 days every 28 days   03/13/2014 Imaging Repeat MRI showed no evidence of progression of disease.   07/14/2014 Imaging Repeat MRI showed no evidence of disease progression.   09/12/2014 Imaging MRI of the liver show mild regression of the size of lesions, presumed to be benign   09/15/2014 Imaging Repeat MRI of the brain show persistent abnormalities, suspicious for residual disease    INTERVAL HISTORY: Please see below for problem oriented charting. She is seen to review test results. In the meantime, she continues to have persistent headache. Denies new neurological deficit.  REVIEW OF SYSTEMS:   Constitutional: Denies fevers, chills or abnormal weight loss Eyes: Denies blurriness of vision Ears, nose, mouth, throat, and face: Denies mucositis or sore throat Respiratory: Denies cough, dyspnea or wheezes Cardiovascular: Denies palpitation, chest discomfort or lower extremity swelling Gastrointestinal:  Denies  nausea, heartburn or change in bowel habits Skin: Denies abnormal skin rashes Lymphatics: Denies new lymphadenopathy or easy bruising Neurological:Denies numbness, tingling or new weaknesses Behavioral/Psych: Mood is stable, no new changes  All other systems were reviewed with the patient and are negative.  I have reviewed the past medical history, past surgical history, social history and family history with the patient and they are unchanged from previous note.  ALLERGIES:  is allergic to adhesive; latex; percocet; shellfish allergy; soma; and vancomycin.  MEDICATIONS:  Current Outpatient Prescriptions  Medication Sig Dispense Refill  . albuterol (PROVENTIL HFA;VENTOLIN HFA) 108 (90 BASE) MCG/ACT inhaler Inhale 2 puffs into the lungs every 6 (six) hours as needed for wheezing or shortness of breath.     . ALPRAZolam (XANAX) 1 MG tablet Take 1 mg by mouth 2 (two) times daily as needed (for seizures).     . carisoprodol (SOMA) 350 MG tablet Take 350 mg by mouth 4 (four) times daily. Takes with Keppra for seizure prevention.    Marland Kitchen dexamethasone (DECADRON) 4 MG tablet Take 1 tablet (4 mg total) by mouth 2 (two) times daily with a meal. (Patient taking differently: Take 4 mg by mouth as needed. Takes 1/2 tablet daily) 30 tablet 0  . divalproex (DEPAKOTE ER) 250 MG 24 hr tablet Take 2 tablets every am and 3 tablets every pm 150 tablet 3  . gabapentin (NEURONTIN) 300 MG capsule Take 2 capsules in the morning and 2 capsules in the evening 120 capsule 3  . HYDROmorphone (DILAUDID) 8 MG tablet Take 1 tablet (8 mg total) by mouth every 6 (six) hours as needed for severe pain. (Patient not taking: Reported on 09/22/2014) 60 tablet 0  . ondansetron (ZOFRAN) 8  MG tablet Take 1 tablet (8 mg total) by mouth every 8 (eight) hours as needed for nausea or vomiting. (Patient not taking: Reported on 09/22/2014) 30 tablet 3  . temozolomide (TEMODAR) 180 MG capsule Take 1 capsule (180 mg total) by mouth See admin  instructions. Takes 2 capsules daily (360 mg) for 5 days, then stops for 23 days, then repeats. (Patient not taking: Reported on 09/22/2014) 10 capsule 6  . butalbital-acetaminophen-caffeine (FIORICET) 50-325-40 MG per tablet Take 1 tablet by mouth every 6 (six) hours as needed for headache. 90 tablet 0  . cetirizine (ZYRTEC) 10 MG tablet Take 10 mg by mouth daily.    Marland Kitchen morphine (MSIR) 30 MG tablet Take 1 tablet (30 mg total) by mouth every 6 (six) hours as needed for severe pain. 90 tablet 0   No current facility-administered medications for this visit.    PHYSICAL EXAMINATION: ECOG PERFORMANCE STATUS: 1 - Symptomatic but completely ambulatory  Filed Vitals:   09/22/14 1317  BP: 123/81  Pulse: 94  Temp: 97.6 F (36.4 C)  Resp: 18   Filed Weights   09/22/14 1317  Weight: 179 lb 1.6 oz (81.239 kg)    GENERAL:alert, no distress and comfortable SKIN: skin color, texture, turgor are normal, no rashes or significant lesions EYES: normal, Conjunctiva are pink and non-injected, sclera clear Musculoskeletal:no cyanosis of digits and no clubbing  NEURO: alert & oriented x 3 with fluent speech, no focal motor/sensory deficits  LABORATORY DATA:  I have reviewed the data as listed    Component Value Date/Time   NA 143 09/22/2014 1259   NA 138 01/27/2014 0611   K 4.1 09/22/2014 1259   K 4.4 01/27/2014 0611   CL 99 01/27/2014 0611   CO2 29 09/22/2014 1259   CO2 26 01/27/2014 0611   GLUCOSE 89 09/22/2014 1259   GLUCOSE 115* 01/27/2014 0611   BUN 12.0 09/22/2014 1259   BUN 8 01/27/2014 0611   CREATININE 0.7 09/22/2014 1259   CREATININE 0.48* 01/27/2014 0611   CALCIUM 8.8 09/22/2014 1259   CALCIUM 9.0 01/27/2014 0611   PROT 6.3* 09/22/2014 1259   PROT 6.9 09/20/2013 0455   ALBUMIN 3.6 09/22/2014 1259   ALBUMIN 3.9 09/20/2013 0455   AST 13 09/22/2014 1259   AST 12 09/20/2013 0455   ALT 12 09/22/2014 1259   ALT 8 09/20/2013 0455   ALKPHOS 48 09/22/2014 1259   ALKPHOS 40  09/20/2013 0455   BILITOT 0.47 09/22/2014 1259   BILITOT 0.7 09/20/2013 0455   GFRNONAA >90 01/27/2014 0611   GFRAA >90 01/27/2014 0611    No results found for: SPEP, UPEP  Lab Results  Component Value Date   WBC 4.7 09/22/2014   NEUTROABS 2.2 09/22/2014   HGB 12.6 09/22/2014   HCT 39.4 09/22/2014   MCV 95.4 09/22/2014   PLT 219 09/22/2014      Chemistry      Component Value Date/Time   NA 143 09/22/2014 1259   NA 138 01/27/2014 0611   K 4.1 09/22/2014 1259   K 4.4 01/27/2014 0611   CL 99 01/27/2014 0611   CO2 29 09/22/2014 1259   CO2 26 01/27/2014 0611   BUN 12.0 09/22/2014 1259   BUN 8 01/27/2014 0611   CREATININE 0.7 09/22/2014 1259   CREATININE 0.48* 01/27/2014 0611      Component Value Date/Time   CALCIUM 8.8 09/22/2014 1259   CALCIUM 9.0 01/27/2014 0611   ALKPHOS 48 09/22/2014 1259   ALKPHOS 40 09/20/2013 0455  AST 13 09/22/2014 1259   AST 12 09/20/2013 0455   ALT 12 09/22/2014 1259   ALT 8 09/20/2013 0455   BILITOT 0.47 09/22/2014 1259   BILITOT 0.7 09/20/2013 0455       RADIOGRAPHIC STUDIES: I have reviewed both MRI of the liver and MR of the brain. I have personally reviewed the radiological images as listed and agreed with the findings in the report.   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 Although Temodar provided stability of her disease on imaging study, it does not appear to have any effect of shrinking or eliminated the abnormalities seen on MRI, which I suspect is related to residual disease. The patient is interested to seek second opinion at [facility]. I recommend she keeps appointment to see radiation oncology to discuss about the role of radiation therapy. In the meantime, I recommend she holds Temodar until she have definitive plan of care. I will continue supportive care by providing her medications for pain and  her headaches   Intractable chronic cluster headache She had recent injection by her PCP with slight improvement of headache. She is also taking dexamethasone prn. She is taking pain medication rally and continue to take Fioricet as needed. This is stable. I refill her medications today.    Simple partial seizures She is pleased with her current anti-seizure management by her neurologist.   Liver lesion I have reviewed her most recent MRI. The cause is unclear but suspect it is benign in nature.    No orders of the defined types were placed in this encounter.   All questions were answered. The patient knows to call the clinic with any problems, questions or concerns. No barriers to learning was detected. I spent 25 minutes counseling the patient face to face. The total time spent in the appointment was 30 minutes and more than 50% was on counseling and review of test results     [facility], [place], MD 09/23/2014 7:33 AM

## 2014-09-23 NOTE — Assessment & Plan Note (Signed)
I have reviewed her most recent MRI. The cause is unclear but suspect it is benign in nature.

## 2014-09-23 NOTE — Telephone Encounter (Signed)
Pt called requesting Cameo call her for a personal question. She wants Dr Alvy Bimler to be aware of Dr Alfredo Bach report with pt: ie send her records to Dr Tommi Rumps at [facility] or make an appt at [facility]. She is wanting to see about the possibility of resection of her tumor. This note forwarded to Cameo and Dr Alvy Bimler

## 2014-09-23 NOTE — Telephone Encounter (Signed)
Referral to Dr. Tommi Rumps at [facility],  Request given to Charlie Pitter in HIM.  Left VM for pt informing her of referral made for second opinion.   Asked her to call back if any questions.

## 2014-09-23 NOTE — Addendum Note (Signed)
Encounter addended by: Heywood Footman, RN on: 09/23/2014 12:24 PM<BR>     Documentation filed: Charges VN

## 2014-09-23 NOTE — Assessment & Plan Note (Signed)
We had extensive discussion about this. Although Temodar provided stability of her disease on imaging study, it does not appear to have any effect of shrinking or eliminated the abnormalities seen on MRI, which I suspect is related to residual disease. The patient is interested to seek second opinion at [facility]. I recommend she keeps appointment to see radiation oncology to discuss about the role of radiation therapy. In the meantime, I recommend she holds Temodar until she have definitive plan of care. I will continue supportive care by providing her medications for pain and her headaches

## 2014-09-23 NOTE — Telephone Encounter (Signed)
Can we please forward a copy of the notes to [place], Dr. Tommi Rumps and request official consult/second opinion? Thanks

## 2014-09-25 ENCOUNTER — Telehealth: Payer: Self-pay | Admitting: *Deleted

## 2014-09-25 ENCOUNTER — Telehealth: Payer: Self-pay | Admitting: Hematology and Oncology

## 2014-09-25 NOTE — Telephone Encounter (Signed)
FAXED PT [facility] OFFICE [phone].   AFTER DR Tommi Rumps REVIEWS PT RECORDS HIS OFFICE WILL CALL PT WITH APPT.

## 2014-09-25 NOTE — Telephone Encounter (Signed)
PT APPT WITH DR Tommi Rumps 10/14/14. PT IS AWARE

## 2014-09-25 NOTE — Telephone Encounter (Signed)
Left Vm for pt asking which Dr. Tommi Rumps at [facility] she wants to see?  There is a [place] and Derrill Memo, both in Neurosurgery Dept.Marland Kitchen

## 2014-10-01 ENCOUNTER — Telehealth: Payer: Self-pay | Admitting: *Deleted

## 2014-10-01 NOTE — Telephone Encounter (Signed)
Spoke with Estill Bamberg. 3/30 in the morning to see Dr Alvy Bimler. Schedulers to call with time.  Pt needs to call Case worker to see which PCPs are accepting new medicaid patients. RN left message with Dr Lacy Duverney office to find out what labs he wanted drawn.

## 2014-10-01 NOTE — Telephone Encounter (Signed)
Call received from patient.  Listened carefully and will route information to Dr. Alvy Bimler and collaborative nurse. 1. "[facility] called with appointment scheduled for 10-14-2014 with Dr. Jalene Mullet."   2. "I need an appointment with Dr, Alvy Bimler and Dr. Tammi Klippel after the appointment at [facility]."   3. "[facility] called asking which Dr. Jalene Mullet at [facility] I will need to see after I'd already heard from [place] so I need to know how these lines got crossed.  Lisanne unable to tell me who she is scheduled to see at [facility] except it is a man."  She will call [facility] and let [place] know.  There are six Dr. Mayer Camel at [facility].  Armandina Stammer is neurologic surgeon with [facility], Shon Millet works with neuro-oncology. 4. "Saw Neurosurgeon Dr. Christella Noa and I'm having strange symptoms he instructed me to have Dr. Alvy Bimler or my PCP do labs.  I do not like my PCP, I trust Dr. Alvy Bimler and would like her to make a referral to someone she knows who works well in [place].  I'm between [place] and don't mind traveling to [place]." 5. "Dr. Christella Noa named three different lab test I need but don't recall names.  My strange symptoms are from inflammation or swelling.  The last two weeks I cannot sleep at night and only sleep during the day.  I sweat profusely, b/p spikes and nothing I do helps.  One week ago I started LACTATING.  Is swelling pushing on my pituitary?"  I don't know if I need these labs before I go to [facility] or when.

## 2014-10-03 ENCOUNTER — Encounter: Payer: Self-pay | Admitting: Hematology and Oncology

## 2014-10-03 ENCOUNTER — Telehealth: Payer: Self-pay | Admitting: *Deleted

## 2014-10-03 ENCOUNTER — Telehealth: Payer: Self-pay | Admitting: Hematology and Oncology

## 2014-10-03 ENCOUNTER — Other Ambulatory Visit: Payer: Self-pay | Admitting: Hematology and Oncology

## 2014-10-03 DIAGNOSIS — R5382 Chronic fatigue, unspecified: Secondary | ICD-10-CM

## 2014-10-03 DIAGNOSIS — C719 Malignant neoplasm of brain, unspecified: Secondary | ICD-10-CM

## 2014-10-03 DIAGNOSIS — G40109 Localization-related (focal) (partial) symptomatic epilepsy and epileptic syndromes with simple partial seizures, not intractable, without status epilepticus: Secondary | ICD-10-CM

## 2014-10-03 DIAGNOSIS — O9270 Unspecified disorders of lactation: Secondary | ICD-10-CM

## 2014-10-03 DIAGNOSIS — R232 Flushing: Secondary | ICD-10-CM

## 2014-10-03 HISTORY — DX: Chronic fatigue, unspecified: R53.82

## 2014-10-03 HISTORY — DX: Unspecified disorders of lactation: O92.70

## 2014-10-03 HISTORY — DX: Flushing: R23.2

## 2014-10-03 NOTE — Telephone Encounter (Signed)
I will order cortisol level, thyroid and a few other labs. I will place orders

## 2014-10-03 NOTE — Telephone Encounter (Signed)
Informed pt of new lab orders Dr. Alvy Bimler to check prolactin level.  Pt will come in Monday for lab.  She wants Dr. Alvy Bimler to know she has been lactacting for about one and half weeks and has gained a lot of weight in spite of feeling like she is hardly eating.  States her weight today at home is 177 lbs and her usual weight this time last year was closer to 140 lb .  She doesn't understand how she is gaining weight. Pt also c/o not able to sleep even w/ taking Restoril at night.  She asks if Dr. Alvy Bimler will check any other hormonal levels along w/ proclactin?   Pt asks if the "midline shift" in her brain is causing pressure on her Pituitary gland and causing all these symptoms?  Pt confirmed she is going to see MD at [facility] on 3/29 and confirmed her appt w/ Dr. Alvy Bimler and Dr. Tammi Klippel on 4/4.

## 2014-10-03 NOTE — Telephone Encounter (Signed)
per pof to sch pt appt-per pof pt aware °

## 2014-10-06 ENCOUNTER — Other Ambulatory Visit: Payer: Medicaid Other

## 2014-10-06 ENCOUNTER — Other Ambulatory Visit (HOSPITAL_BASED_OUTPATIENT_CLINIC_OR_DEPARTMENT_OTHER): Payer: Medicaid Other

## 2014-10-06 DIAGNOSIS — G40109 Localization-related (focal) (partial) symptomatic epilepsy and epileptic syndromes with simple partial seizures, not intractable, without status epilepticus: Secondary | ICD-10-CM

## 2014-10-06 DIAGNOSIS — R635 Abnormal weight gain: Secondary | ICD-10-CM | POA: Diagnosis present

## 2014-10-06 DIAGNOSIS — C719 Malignant neoplasm of brain, unspecified: Secondary | ICD-10-CM

## 2014-10-06 DIAGNOSIS — R232 Flushing: Secondary | ICD-10-CM

## 2014-10-06 DIAGNOSIS — O9270 Unspecified disorders of lactation: Secondary | ICD-10-CM

## 2014-10-06 DIAGNOSIS — R5382 Chronic fatigue, unspecified: Secondary | ICD-10-CM

## 2014-10-06 LAB — TSH CHCC: TSH: 1.093 m(IU)/L (ref 0.308–3.960)

## 2014-10-07 LAB — PROLACTIN: Prolactin: 5.6 ng/mL

## 2014-10-07 LAB — FOLLICLE STIMULATING HORMONE: FSH: 3 m[IU]/mL

## 2014-10-07 LAB — CORTISOL: Cortisol, Plasma: 0.9 ug/dL

## 2014-10-07 LAB — LUTEINIZING HORMONE: LH: 0.6 m[IU]/mL

## 2014-10-07 LAB — T4, FREE: Free T4: 1.2 ng/dL (ref 0.80–1.80)

## 2014-10-14 ENCOUNTER — Telehealth: Payer: Self-pay | Admitting: *Deleted

## 2014-10-14 DIAGNOSIS — C719 Malignant neoplasm of brain, unspecified: Secondary | ICD-10-CM

## 2014-10-14 DIAGNOSIS — G44011 Episodic cluster headache, intractable: Secondary | ICD-10-CM

## 2014-10-14 DIAGNOSIS — C711 Malignant neoplasm of frontal lobe: Secondary | ICD-10-CM

## 2014-10-14 MED ORDER — BUTALBITAL-APAP-CAFFEINE 50-325-40 MG PO TABS: 1.0000 | ORAL_TABLET | Freq: Four times a day (QID) | ORAL | Status: DC | PRN

## 2014-10-14 NOTE — Telephone Encounter (Signed)
HAS DR.GORSUCH RECEIVED DR.FRIEDMAN'S RECOMMENDATIONS FROM TODAY'S VISIT? PT. HAD LAB ON 10/06/14. SHE IS UNABLE TO SEE THE RESULTS IN MY CHART. PT. NEEDS A REFILL ON HER FIORICET. SPOKE TO DR.GORSUCH'S NURSE, [facility]. THE RECOMMENDATIONS FROM TODAY'S VISIT HAVE NOT BEEN RECEIVED. 10/06/14 LAB RESULTS SHOULD HAVE AUTOMATICALLY BEEN RELEASED TO MY CHART. NOTIFIED PT. OF THE ABOVE INFORMATION. ALSO INFORMED PT. THAT A COPY OF THE LAB RESULTS FOR 10/06/14 CAN BE PICKED UP AT THIS OFFICE TO SEND TO DR.FRIEDMAN IF NECESSARY. FIORICET PRESCRIPTION CALL TO PHARMACY VIA VOICE MAIL ON PT.'S PHONE.

## 2014-10-14 NOTE — Telephone Encounter (Signed)
Please ask if she can come in on Fri, 4/1 for the 3:30 work-in slot. Thanks!

## 2014-10-14 NOTE — Telephone Encounter (Signed)
Please review

## 2014-10-14 NOTE — Telephone Encounter (Signed)
Patient has question about her medication patient stats she has a brain tumor and the chemo is not working ???????. Patient would like to see Dr. Delice Lesch before April 20 or speak with her about some of these issued before she goes back to her oncologist  Call back [phone]

## 2014-10-15 ENCOUNTER — Ambulatory Visit: Payer: Medicaid Other | Admitting: Hematology and Oncology

## 2014-10-15 ENCOUNTER — Telehealth: Payer: Self-pay | Admitting: *Deleted

## 2014-10-15 MED ORDER — GABAPENTIN 300 MG PO CAPS: ORAL_CAPSULE | ORAL | Status: DC

## 2014-10-15 NOTE — Telephone Encounter (Signed)
Patient scheduled.

## 2014-10-15 NOTE — Telephone Encounter (Signed)
Patient scheduled to come in on Friday at 3:30 however she will run out of her medication Gabapentin tonight patient states she increased per Dr. Delice Lesch, please send in a new RX to reflex the increase  Call back number [phone]

## 2014-10-15 NOTE — Addendum Note (Signed)
Addended by: Thurmon Fair on: 10/15/2014 12:04 PM   Modules accepted: Orders

## 2014-10-15 NOTE — Telephone Encounter (Signed)
I spoke with patient about her refill request, according to when she last got an Rx with refills she should still have refills available at the pharmacy. Patient asked me to clarify the directions of how Dr. Delice Lesch wanted her to take the medication which was 2 capsules in the morning & 2 capsules in the evening. Patient states that she has been taking 3 capsules in the morning & 3 capsules in the evening. She apologized that she mis understood what exactly she was supposed to take. Will send in refill for how she's been taking gabapentin and she will discuss this further with Dr. Delice Lesch on Friday at her office visit.

## 2014-10-15 NOTE — Telephone Encounter (Signed)
Will pick up copies of labs from 10/06/14 to send to Dr. Tommi Rumps.  She said she needs a refill on gabapentin.  The dose was increased by the neurologist when her Depakot was decreased.  Advised her to call the neurologist for a new Rx since she changed the dose.  Victoria Bates agreed to do so.  Copies of labs left for her at front desk.

## 2014-10-17 ENCOUNTER — Encounter: Payer: Self-pay | Admitting: Radiation Oncology

## 2014-10-17 ENCOUNTER — Ambulatory Visit (INDEPENDENT_AMBULATORY_CARE_PROVIDER_SITE_OTHER): Payer: Medicaid Other | Admitting: Neurology

## 2014-10-17 ENCOUNTER — Encounter: Payer: Self-pay | Admitting: Neurology

## 2014-10-17 VITALS — BP 110/78 | HR 106 | Ht 69.0 in | Wt 180.2 lb

## 2014-10-17 DIAGNOSIS — G40109 Localization-related (focal) (partial) symptomatic epilepsy and epileptic syndromes with simple partial seizures, not intractable, without status epilepticus: Secondary | ICD-10-CM

## 2014-10-17 DIAGNOSIS — C719 Malignant neoplasm of brain, unspecified: Secondary | ICD-10-CM | POA: Diagnosis not present

## 2014-10-17 MED ORDER — LAMOTRIGINE 25 MG PO TABS: ORAL_TABLET | ORAL | Status: DC

## 2014-10-17 NOTE — Patient Instructions (Addendum)
1. Start Lamictal 25mg : Take 1 tablet every other day for 2 weeks, then increas e to 1 tablet daily and continue for now 2. Continue Depakote 2 tablets twice a day and Neurontin 3 caps twice a day 3. Speak to your PCP regarding Endocrinology consult for hormone concerns 4. Continue follow-up with Dr. Tommi Rumps and Dr. Alvy Bimler 5. Follow-up in 2 months, call to update me on Lamictal after 1 month

## 2014-10-17 NOTE — Progress Notes (Signed)
NEUROLOGY FOLLOW UP OFFICE NOTE  SHER SHAMPINE [id number]  HISTORY OF PRESENT ILLNESS: I had the pleasure of seeing Victoria Bates in follow-up in the neurology clinic on 10/17/2014.  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 She is accompanied by her 2 young children today. Since her last visit, she has sought a second opinion with neurosurgeon Dr. Tommi Rumps at [facility] reporting that he will be reviewing all her scans and may consider either functional MRI or biopsy. She had an MRI 08/2014 which I personally reviewed, there is similar pattern of enlargement and abnormal FLAIR signal in the right frontal gyrus immediately superior to the resection, stable from previous scan. She states that she gained weight from 2013 to 2014 despite being active, and was diagnosed with depression. She also reports trying different BP medications and having night sweats, palpitations, in addition to hypertension. In July 2014, she was moving offices and had back pain diagnosed as a ruptured disc. She received trigger point injections, epidural injections, and steroids, and apparently lost weight (80 lbs). She then started gaining weight again, and started having night sweats despite it being 66 degrees in their house. She states she is not hungry and has had urinary frequency. This then leads to her lactating 3 weeks ago, "I could breastfeed a baby now." She had hormonal studies done by her PCP and tells me her "LH is low and cortisol is non-existent." Prolactin level normal. She is asking about fatigue, she would go 90 miles an hour, then become shaky, crash, and sleep for several hours. She has not had any clear seizures on Depakote 528m BID and gabapentin 9024mBID. She continues to take Decadron which she reports helps with the headaches.  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 [facility] due to left vision changes and left facial heaviness. She reports that she had a 60-lb unintentional weight loss 4 months leading up to her admission, she was vomiting after every meal. She started having episodes where her hands would have low amplitude tremors and she feels the right lower face twitching. She would need to pause and take a minute to get words out. If she did not stop what she was doing, the symptoms could last for hours. She would feel like her body was "going 90 mph" with palpitations up to 200 bpm. She was started on Xanax for anxiety, but this did not help. She then started having vision changes on her left eye, she would park crookedly when she thought she was going straight. She saw an ophthalmologist, eye exam was normal but left optic neuritis was questioned and brain imaging recommended. She then started to have left facial heaviness and numbness on the left face, arm, and leg, [facility]. She underwent subtotal resection on 01/23/14 by Dr. CaChristella Noand was started on Keppra for seizure prophylaxis at that time. She continued to have the recurrent episodes of tremors in both arms, palpitations, and right facial tightness, as well as episodes of left face, neck, shoulder, and arm numbness, going down the left thumb and index finger. She describes her major seizures as "all of that times 100." After  the episodes, she would be tired for a few days, wanting to sleep, with no energy. She has noticed that her short term memory is "gone." Keppra dose had been increased up to 2 grams daily, and this had caused a change in personality. She had seen neurologist Dr. Leonie Man, who switched her to Depakote. She tapered off Keppra and is currently on Depakote ER  52m BID with significant improvement in symptoms. She denies any side effects to the Depakote. She brings a calendar of her symptoms, and reports having a bigger episode on 10/26. Prior to this, she had seizures on 10/22 and 10/23, she was tired for several hours after and did not get out of bed. Last night she woke up drenched in sweat and took Xanax. She reports episodes of confusion, she was driving last Monday and went into a parking lot. Her son asked her what they were doing there and she did not realize where they were and how they got there. She reports these milder episodes occur a couple of times a week, she has more severe ones of memory gaps around once a week. She denies any olfactory/gustatory hallucinations, deja vu, rising epigastric sensation, myoclonic jerks.  She has a history of recurrent headaches since her teenage years. Headaches are usually over the right temporal and occipital region, with sharp stabbing and throbbing pain, with a knot in her left neck region. There is occasional nausea, photo, and phonophobia. She was having these headaches around 1-2 times a week when younger, however since December 2014, headaches have been occurring around 2-3 times a week. She had been receiving injections in her neck for this. If she takes medication at the onset, they last a few hours, however they may last up to several days. She has found that taking Decadron for a couple of days has been the only helpful medication. She takes Fioricet rarely. Zomig, aspirin, and Tylenol were ineffective in the past. Her father and 128year old son have headaches. She denies any diplopia, dysarthria, dysphagia, bowel/bladder dysfunction. She has chronic back pain and had been taking Soma qid for several years, with worsening of pain when she tried reducing this. She was started on Gabapentin for the seizures and back pain, she is unsure if this helps. She had been taking Ultram for the pain, and once stopping  this, she noticed an improvement in the seizures as well.   Diagnostic Data: [facility] right frontotemporal regions which is likely from breach artifact from prior craniotomy. There is intermittent right frontal sharp activity but no definite epileptiform activity. Impression: Mild right hemispheric focal cortical irritability but no definitive epileptiform activity is noted.  I personally reviewed MRI brain with and without contrast done 03/13/14 which showed post-op right-sided craniotomy. The surgical cavity in the right frontal lobe is filled with fluid, with note of improvement in layering blood products since prior study. The study of 01/24/2014 showed restricted diffusion deep to the tumor. This is consistent with acute infarct which is resolving at this time. No acute infarct is present on today's study. No evidence of recurrent or residual tumor although the original tumor did not enhance. No mass effect or edema is present that would suggest recurrent tumor. Small amount of subarachnoid hemorrhage on the right again noted. Her 48-hour EEG did not show any epileptiform discharges. It showed breach artifact consistent with prior surgery. She reported numerous symptoms including headache, severe muscle spasm under the base  of her skull on the right side, tremors in both hands, jerking and pause in jaw (right side), left leg/inner thigh and left foot warm and pigmented, eyes twitching, fatigue, with no associated epileptiform correlate seen.  Epilepsy Risk Factors: Right frontal oligodendroglioma s/p subtotal resection. Otherwise she had a normal birth and early development. There is no history of febrile convulsions, CNS infections such as meningitis/encephalitis, or family history of seizures.  PAST MEDICAL HISTORY: Past Medical History  Diagnosis Date  . Liver hemangioma   . Anemia   . Headache(784.0)     migraines  . Anxiety   . Asthma      very mild- no inhaler use since July 2013  . Complication of anesthesia     takes longer for patient to go under anesthesia  . Brain tumor     Hx: of  . Infection     to right head incision  . Petit mal seizure status   . Dizziness   . Seizures     petit mal serizures most days  . Cancer 2003    skin  . Herniated disc   . Nausea alone 02/12/2014  . Dysphasia 02/12/2014  . Headache 02/12/2014  . Neuropathic pain 04/08/2014  . Liver lesion 08/22/2014  . 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 Chronic fatigue 10/03/2014  . Hot flashes 10/03/2014    MEDICATIONS: Current Outpatient Prescriptions on File Prior to Visit  Medication Sig Dispense Refill  . albuterol (PROVENTIL HFA;VENTOLIN HFA) 108 (90 BASE) MCG/ACT inhaler Inhale 2 puffs into the lungs every 6 (six) hours as needed for wheezing or shortness of breath.     . ALPRAZolam (XANAX) 1 MG tablet Take 1 mg by mouth 2 (two) times daily as needed (for seizures).     . butalbital-acetaminophen-caffeine (FIORICET) 50-325-40 MG per tablet Take 1 tablet by mouth every 6 (six) hours as needed for headache. 90 tablet 0  . carisoprodol (SOMA) 350 MG tablet Take 350 mg by mouth 4 (four) times daily. Takes with Keppra for seizure prevention.    . cetirizine (ZYRTEC) 10 MG tablet Take 10 mg by mouth daily.    Marland Kitchen dexamethasone (DECADRON) 4 MG tablet Take 1 tablet (4 mg total) by mouth 2 (two) times daily with a meal. (Patient taking differently: Take 4 mg by mouth as needed. Takes 1/2 tablet daily) 30 tablet 0  . divalproex (DEPAKOTE ER) 250 MG 24 hr tablet Take 2 tablets every am and 2 tablets every pm 150 tablet 3  . gabapentin (NEURONTIN) 300 MG capsule Take 3 capsules in the morning and 3 capsules in the evening 180 capsule 2  . HYDROmorphone (DILAUDID) 8 MG tablet Take 1 tablet (8 mg total) by mouth every 6 (six) hours as needed for severe pain. 60 tablet 0  . morphine (MSIR) 30 MG tablet Take 1 tablet (30 mg total) by mouth every 6 (six) hours as  needed for severe pain. 90 tablet 0  . ondansetron (ZOFRAN) 8 MG tablet Take 1 tablet (8 mg total) by mouth every 8 (eight) hours as needed for nausea or vomiting. 30 tablet 3  . temozolomide (TEMODAR) 180 MG capsule Take 1 capsule (180 mg total) by mouth See admin instructions. Takes 2 capsules daily (360 mg) for 5 days, then stops for 23 days, then repeats. 10 capsule 6   No current facility-administered medications on file prior to visit.    ALLERGIES: Allergies  Allergen Reactions  . Adhesive [Tape] Other (See Comments)    Skin  tear  . Latex Hives  . Percocet [Oxycodone-Acetaminophen] Other (See Comments)    makes pt angry  . Shellfish Allergy Diarrhea and Nausea And Vomiting  . Soma [Carisoprodol]     Watson brand, specifically, causes disorientation (can take other brands)  . Vancomycin Hives    FAMILY HISTORY: Family History  Problem Relation Age of Onset  . Hypertension Mother   . Skin cancer Mother   . Thyroid cancer Father   . Skin cancer Father   . Breast cancer Maternal Aunt     dx in her 21s  . Prostate cancer Maternal Uncle 63  . Melanoma Maternal Uncle   . Ovarian cancer Maternal Grandmother     dx in her late 25s-40s  . Breast cancer Maternal Aunt     bilateral breast cancer dx in 51s and 2s; possibly BRCA+  . Other Son     multiple CAL spots, bright spots on brain MRI, ? chiari malformation    SOCIAL HISTORY: History   Social History  . Marital Status: Single    Spouse Name: N/A  . Number of Children: 2  . Years of Education: College   Occupational History  . laser tech    Social History Main Topics  . Smoking status: Never Smoker   . Smokeless tobacco: Never Used  . Alcohol Use: No  . Drug Use: No  . Sexual Activity: Yes    Birth Control/ Protection: None   Other Topics Concern  . Not on file   Social History Narrative   Patient lives at home with her two children.   Caffeine use: minimum of 4 cups daily    REVIEW OF  SYSTEMS: Constitutional: No fevers, chills, or sweats, no generalized fatigue, change in appetite Eyes: No visual changes, double vision, eye pain Ear, nose and throat: No hearing loss, ear pain, nasal congestion, sore throat Cardiovascular: No chest pain,+ palpitations Respiratory:  No shortness of breath at rest or with exertion, wheezes GastrointestinaI: No nausea, vomiting, diarrhea, abdominal pain, fecal incontinence Genitourinary:  No dysuria, urinary retention or frequency Musculoskeletal:  No neck pain, back pain Integumentary: No rash, pruritus, skin lesions Neurological: as above Psychiatric: No depression, insomnia,+ anxiety Endocrine: No palpitations, fatigue,+ diaphoresis, mood swings, change in appetite, +change in weight, increased thirst Hematologic/Lymphatic:  No anemia, purpura, petechiae. Allergic/Immunologic: no itchy/runny eyes, nasal congestion, recent allergic reactions, rashes  PHYSICAL EXAM: Filed Vitals:   10/17/14 1542  BP: 110/78  Pulse: 106   General: No acute distress Head:  Normocephalic/atraumatic Neck: supple, no paraspinal tenderness, full range of motion Heart:  Regular rate and rhythm Lungs:  Clear to auscultation bilaterally Back: No paraspinal tenderness Skin/Extremities: No rash, no edema Neurological Exam: alert and oriented to person, place, and time. No aphasia or dysarthria. Fund of knowledge is appropriate.  Recent and remote memory are intact.  Attention and concentration are normal.    Able to name objects and repeat phrases. Cranial nerves: Pupils equal, round, reactive to light.  Fundoscopic exam unremarkable, no papilledema. Extraocular movements intact with no nystagmus. Visual fields full. Facial sensation intact. No facial asymmetry. Tongue, uvula, palate midline.  Motor: Bulk and tone normal, muscle strength 5/5 throughout with no pronator drift.  Sensation to light touch intact.  No extinction to double simultaneous stimulation.  Deep  tendon reflexes 2+ throughout, toes downgoing.  Finger to nose testing intact.  Gait narrow-based and steady, able to tandem walk adequately.  Romberg negative.  IMPRESSION: This is a 34 yo RH  woman with a history of grade 2 oligodengroglioma s/p subtotal resection on chemotherapy, anxiety, headaches, with recurrent episodes of low amplitude tremors, palpitations, and right facial tightening, as well as left face and arm numbness. She had a 48-hour EEG which did not show any epileptiform discharges. She reported numerous symptoms and wrote them down in 3 pages, I had discussed hyperfocusing on her symptoms may be magnifying them. She returns today for urgent follow-up to discuss several concerns, mostly she is concerned about her abnormal hormones and recent lactation. This is of unclear etiology, recent MRI brain did not show any pituitary abnormality. She would benefit from Endocrine evaluation. We had discussed addition of Lamictal on her previous visit, this may help with her mood. She is agreeable to starting this, side effects, including risk for [employer] syndrome were discussed. Continue current doses of Depakote and Neurontin for now. These may be contributing to her weight gain, we will plan to taper Gabapentin in the future. She will follow-up in 2 months and will call to update our office in a month regarding tolerance of Lamictal initiation.   Thank you for allowing me to participate in her care.  Please do not hesitate to call for any questions or concerns.  The duration of this appointment visit was 25 minutes of face-to-face time with the patient.  Greater than 50% of this time was spent in counseling, explanation of diagnosis, planning of further management, and coordination of care.   Ellouise Newer, M.D.   CC: Dr. Laurann Montana, Dr. Alvy Bimler

## 2014-10-17 NOTE — Progress Notes (Signed)
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   07/14/2014 Imaging Repeat MRI showed no evidence of disease progression.         Dose of Decadron, if applicable: Taking decadron 4 mg by mouth prn  Recent neurologic symptoms, if any:   Seizures: yes  Headaches: yes  Nausea: denies  Dizziness/ataxia:   Difficulty with hand coordination: denies  Focal numbness/weakness: denies  Visual deficits/changes: denies  Confusion/Memory deficits: denies  Painful bone metastases at present, if any: no  SAFETY ISSUES:  Prior radiation? no  Pacemaker/ICD? no  Possible current pregnancy? no  Is the patient on methotrexate? no  Additional Complaints / other details: 34 year old female. Patient returns today for further discussion with Dr. Tammi Klippel after [facility] follow up on 10/14/2014.  Reports intermittent seizure activity. Explain tremors, memory loss and stuttering occur while seizing. Reports taking depakote two tablets each morning and at night.Takes decadron rarely for unrelieved headaches with Fiorcet or Morphine.   Temodar?

## 2014-10-19 NOTE — Progress Notes (Signed)
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  She had been receiving Temodar 5 days of every 28 since August 2015. Her recent MRI was presented at our neuro oncology conference showed persistent FLAIR signal abnormality surrounding her surgical resection site suggesting possible residual tumor. Her performance status remained excellent and she returned to review the possibility of adjuvant radiotherapy versus further surgical resection. Since then, she pursued a second opinion at [facility].  They reviewed her serial MRI films and recommendation is pending, although re-resection is being considered after functional MRI to obtain more tissue.  ALLERGIES:  is allergic to adhesive; latex; percocet; shellfish allergy; soma; and vancomycin.  Meds: Current Outpatient Prescriptions  Medication Sig Dispense Refill  . albuterol (PROVENTIL HFA;VENTOLIN HFA) 108 (90 BASE) MCG/ACT inhaler Inhale 2 puffs into the lungs every 6 (six) hours as needed for wheezing or shortness of breath.     . ALPRAZolam (XANAX) 1 MG tablet Take 1 mg by mouth 2 (two) times daily as needed (for seizures).     . butalbital-acetaminophen-caffeine (FIORICET) 50-325-40 MG per tablet Take 1 tablet by mouth every 6 (six) hours as needed for headache. 90 tablet 0  . carisoprodol (SOMA) 350 MG tablet Take 350 mg by mouth 4 (four) times daily. Takes with Keppra for seizure prevention.    . cetirizine (ZYRTEC) 10 MG  tablet Take 10 mg by mouth daily.    Marland Kitchen dexamethasone (DECADRON) 4 MG tablet Take 1 tablet (4 mg total) by mouth 2 (two) times daily with a meal. (Patient taking differently: Take 4 mg by mouth as needed. Takes 1/2 tablet daily) 30 tablet 0  . divalproex (DEPAKOTE ER) 250 MG 24 hr tablet Take 2 tablets every am and 3 tablets every pm (Patient taking differently: Take 2 tablets BID) 150 tablet 3  . gabapentin (NEURONTIN) 300 MG capsule Take 3 capsules in the morning and 3 capsules in the evening 180 capsule 2  . HYDROmorphone (DILAUDID) 8 MG tablet Take 1 tablet (8 mg total) by mouth every 6 (six) hours as needed for severe pain. 60 tablet 0  . lamoTRIgine (LAMICTAL) 25 MG tablet Take 1 tablet every other day for 2 weeks, then increase to 1 tablet daily and continue 30 tablet 2  . morphine (MS CONTIN) 15 MG 12 hr tablet Take 1 tablet (15 mg total) by mouth every 12 (twelve) hours. 60 tablet 0  . morphine (MSIR) 15 MG tablet Take 1 tablet (15 mg total) by mouth every 6 (six) hours as needed for severe pain. 90 tablet 0  . ondansetron (ZOFRAN) 8 MG tablet Take 1 tablet (8 mg total) by mouth every 8 (eight) hours as needed for nausea or vomiting. 30 tablet 3  . temozolomide (TEMODAR) 180 MG capsule Take 1 capsule (180 mg total) by mouth See admin instructions. Takes 2 capsules daily (360 mg) for 5 days, then stops for 23 days, then repeats. 10 capsule 6   No  current facility-administered medications for this encounter.    Physical Findings: The patient is in no acute distress. Patient is alert and oriented.  weight is 181 lb (82.101 kg). Her blood pressure is 151/110 and her pulse is 107. Her respiration is 16 and oxygen saturation is 100%. . She has no facial droop and no notable neurologic deficits No significant changes.  Lab Findings: Lab Results  Component Value Date   WBC 4.7 09/22/2014   WBC 7.5 01/27/2014   HGB 12.6 09/22/2014   HGB 10.6* 01/27/2014   HCT 39.4 09/22/2014   HCT 32.0*  01/27/2014   PLT 219 09/22/2014   PLT 222 01/27/2014    Lab Results  Component Value Date   NA 143 09/22/2014   NA 138 01/27/2014   K 4.1 09/22/2014   K 4.4 01/27/2014   CHLORIDE 104 09/22/2014   CO2 29 09/22/2014   CO2 26 01/27/2014   GLUCOSE 89 09/22/2014   GLUCOSE 115* 01/27/2014   BUN 12.0 09/22/2014   BUN 8 01/27/2014   CREATININE 0.7 09/22/2014   CREATININE 0.48* 01/27/2014   BILITOT 0.47 09/22/2014   BILITOT 0.7 09/20/2013   ALKPHOS 48 09/22/2014   ALKPHOS 40 09/20/2013   AST 13 09/22/2014   AST 12 09/20/2013   ALT 12 09/22/2014   ALT 8 09/20/2013   PROT 6.3* 09/22/2014   PROT 6.9 09/20/2013   ALBUMIN 3.6 09/22/2014   ALBUMIN 3.9 09/20/2013   CALCIUM 8.8 09/22/2014   CALCIUM 9.0 01/27/2014   ANIONGAP 9 09/22/2014   ANIONGAP 13 01/27/2014     Impression:  This patient is a very nice 34 year old woman status post subtotal resection of a 3 cm grade 2 oligodendroglioma with 1P 19 Q deletion. She may potentially benefit from adjuvant radiotherapy depending on the extent of the anticipated surgical resection.  Plan:  I will plan to follow-up with the patient after her completion of surgical therapy at [facility]  _____________________________________  Sheral Apley. Tammi Klippel, M.D.

## 2014-10-20 ENCOUNTER — Ambulatory Visit (HOSPITAL_BASED_OUTPATIENT_CLINIC_OR_DEPARTMENT_OTHER): Payer: Medicaid Other | Admitting: Hematology and Oncology

## 2014-10-20 ENCOUNTER — Other Ambulatory Visit: Payer: Self-pay | Admitting: Hematology and Oncology

## 2014-10-20 ENCOUNTER — Encounter: Payer: Self-pay | Admitting: Radiation Oncology

## 2014-10-20 ENCOUNTER — Encounter: Payer: Self-pay | Admitting: Hematology and Oncology

## 2014-10-20 ENCOUNTER — Ambulatory Visit
Admission: RE | Admit: 2014-10-20 | Discharge: 2014-10-20 | Disposition: A | Discharge status: Home / Self Care | Payer: Medicaid Other | Source: Ambulatory Visit | Attending: Radiation Oncology | Admitting: Radiation Oncology

## 2014-10-20 VITALS — BP 151/110 | HR 107 | Resp 16 | Wt 181.0 lb

## 2014-10-20 VITALS — BP 179/126 | HR 86 | Temp 97.7°F | Resp 21 | Ht 69.0 in | Wt 181.9 lb

## 2014-10-20 DIAGNOSIS — E2740 Unspecified adrenocortical insufficiency: Secondary | ICD-10-CM

## 2014-10-20 DIAGNOSIS — G44021 Chronic cluster headache, intractable: Secondary | ICD-10-CM

## 2014-10-20 DIAGNOSIS — C711 Malignant neoplasm of frontal lobe: Secondary | ICD-10-CM

## 2014-10-20 DIAGNOSIS — O9270 Unspecified disorders of lactation: Secondary | ICD-10-CM | POA: Diagnosis not present

## 2014-10-20 DIAGNOSIS — G44011 Episodic cluster headache, intractable: Secondary | ICD-10-CM

## 2014-10-20 DIAGNOSIS — G40109 Localization-related (focal) (partial) symptomatic epilepsy and epileptic syndromes with simple partial seizures, not intractable, without status epilepticus: Secondary | ICD-10-CM

## 2014-10-20 DIAGNOSIS — R7989 Other specified abnormal findings of blood chemistry: Secondary | ICD-10-CM

## 2014-10-20 DIAGNOSIS — C719 Malignant neoplasm of brain, unspecified: Secondary | ICD-10-CM

## 2014-10-20 MED ORDER — BUTALBITAL-APAP-CAFFEINE 50-325-40 MG PO TABS
1.0000 | ORAL_TABLET | Freq: Four times a day (QID) | ORAL | Status: DC | PRN
Start: 2014-10-20 — End: 2014-11-13

## 2014-10-20 MED ORDER — MORPHINE SULFATE ER 15 MG PO TBCR: 15.0000 mg | EXTENDED_RELEASE_TABLET | Freq: Two times a day (BID) | ORAL | Status: DC

## 2014-10-20 MED ORDER — MORPHINE SULFATE 15 MG PO TABS: 15.0000 mg | ORAL_TABLET | Freq: Four times a day (QID) | ORAL | Status: DC | PRN

## 2014-10-20 NOTE — Progress Notes (Signed)
BP elevated but, less than earlier today. Patient reports that she has taken soma and xanax. Reports headache behind right eye, right temple and down scalp to neck. Denies taking temodar. Per patient Victoria Bates is reviewing her scans, may want to get a functional MRI and plans a needle biopsy or resection to obtain path. Cancelled patient's appointment with Dr. Christella Noa for tomorrow since she doesn't have all information from [facility] yet. Patient will reschedule once information is obtained.

## 2014-10-20 NOTE — Assessment & Plan Note (Signed)
She has low serum cortisol level which could be a sign of adrenal insufficiency. However, she would need further evaluation such as ACTH level and stimulating test. I recommend endocrinology consultation and she agree. In the meantime, the dexamethasone is helping her headache tremendously and recommend she continues taking that as needed.

## 2014-10-20 NOTE — Assessment & Plan Note (Signed)
The patient continued to have intermittent lactation. I have ordered the hormonal axis with the results came back abnormal. I am not sure how to proceed with this and recommend endocrinology evaluation and she agreed to proceed.

## 2014-10-20 NOTE — Assessment & Plan Note (Signed)
She had recent injection by her PCP with slight improvement of headache. She is also taking dexamethasone prn. She is taking pain medication and continue to take Fioricet as needed. This is stable. I refill her medications today.

## 2014-10-20 NOTE — Assessment & Plan Note (Signed)
We had extensive discussion about this. Although Temodar provided stability of her disease on imaging study, it does not appear to have any effect of shrinking or eliminated the abnormalities seen on MRI, which I suspect is related to residual disease. The patient is undergoing evaluation and second opinion at [facility]. I recommend she keeps appointment to see radiation oncology to discuss about the role of radiation therapy. In the meantime, I recommend she holds Temodar until she have definitive plan of care. I will continue supportive care by providing her medications for pain and her headaches

## 2014-10-20 NOTE — Progress Notes (Signed)
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   09/27/2013 Pathology Results Accession: [id number] pathology showed grade 2 oligodendroglioma with mutant IDH1 protein, ATRX, OLIG 2 expression. p53 is negative, low Ki-67 labeling index. 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   11/06/2013 Imaging Repeat CT scan of the head showed resolution of abscess.   01/23/2014 Surgery Dr. Christella Noa perform a subtotal gross resection of the tumor.   01/24/2014 Imaging Repeat imaging studies showed persistent and residual disease.   03/03/2014 -  Chemotherapy She was started on Temodar, 5 days every 28 days   03/13/2014 Imaging Repeat MRI showed no evidence of progression of disease.   07/14/2014 Imaging Repeat MRI showed no evidence of disease progression.   09/12/2014 Imaging MRI of the liver show mild regression of the size of lesions, presumed to be benign   09/15/2014 Imaging Repeat MRI of the brain show persistent abnormalities, suspicious for residual disease    INTERVAL HISTORY: Please see below for problem oriented charting. She returns for further follow-up. She had progressive worsening seizure, headache, and frequent affected speech. She denies new neurological symptoms. She is undergoing evaluation at [facility]. She has intermittent lactation. She was complaining of intermittent hot flashes.  REVIEW OF SYSTEMS:   Constitutional: Denies fevers, chills or abnormal weight loss Eyes: Denies blurriness of vision Ears, nose, mouth, throat, and face: Denies mucositis or sore throat Respiratory:  Denies cough, dyspnea or wheezes Cardiovascular: Denies palpitation, chest discomfort or lower extremity swelling Gastrointestinal:  Denies nausea, heartburn or change in bowel habits Skin: Denies abnormal skin rashes Lymphatics: Denies new lymphadenopathy or easy bruising Behavioral/Psych: Mood is stable, no new changes  All other systems were reviewed with the patient and are negative.  I have reviewed the past medical history, past surgical history, social history and family history with the patient and they are unchanged from previous note.  ALLERGIES:  is allergic to adhesive; latex; percocet; shellfish allergy; soma; and vancomycin.  MEDICATIONS:  Current Outpatient Prescriptions  Medication Sig Dispense Refill  . albuterol (PROVENTIL HFA;VENTOLIN HFA) 108 (90 BASE) MCG/ACT inhaler Inhale 2 puffs into the lungs every 6 (six) hours as needed for wheezing or shortness of breath.     . ALPRAZolam (XANAX) 1 MG tablet Take 1 mg by mouth 2 (two) times daily as needed (for seizures).     . butalbital-acetaminophen-caffeine (FIORICET) 50-325-40 MG per tablet Take 1 tablet by mouth every 6 (six) hours as needed for headache. 90 tablet 0  . carisoprodol (SOMA) 350 MG tablet Take 350 mg by mouth 4 (four) times daily. Takes with Keppra for seizure prevention.    . cetirizine (ZYRTEC) 10 MG tablet Take 10 mg by mouth daily.    Marland Kitchen dexamethasone (DECADRON) 4 MG tablet Take 1 tablet (4 mg total) by mouth 2 (two) times daily with a meal. (Patient taking differently: Take 4 mg by mouth as needed. Takes 1/2 tablet daily) 30 tablet 0  . divalproex (DEPAKOTE ER) 250 MG 24 hr tablet Take 2 tablets every am and 3 tablets every pm (Patient taking differently: Take 2 tablets BID) 150 tablet 3  . gabapentin (  NEURONTIN) 300 MG capsule Take 3 capsules in the morning and 3 capsules in the evening 180 capsule 2  . HYDROmorphone (DILAUDID) 8 MG tablet Take 1 tablet (8 mg total) by mouth every 6 (six) hours as needed  for severe pain. 60 tablet 0  . lamoTRIgine (LAMICTAL) 25 MG tablet Take 1 tablet every other day for 2 weeks, then increase to 1 tablet daily and continue 30 tablet 2  . morphine (MSIR) 15 MG tablet Take 1 tablet (15 mg total) by mouth every 6 (six) hours as needed for severe pain. 90 tablet 0  . ondansetron (ZOFRAN) 8 MG tablet Take 1 tablet (8 mg total) by mouth every 8 (eight) hours as needed for nausea or vomiting. 30 tablet 3  . temozolomide (TEMODAR) 180 MG capsule Take 1 capsule (180 mg total) by mouth See admin instructions. Takes 2 capsules daily (360 mg) for 5 days, then stops for 23 days, then repeats. 10 capsule 6  . morphine (MS CONTIN) 15 MG 12 hr tablet Take 1 tablet (15 mg total) by mouth every 12 (twelve) hours. 60 tablet 0   No current facility-administered medications for this visit.    PHYSICAL EXAMINATION: ECOG PERFORMANCE STATUS: 1 - Symptomatic but completely ambulatory  Filed Vitals:   10/20/14 1431  BP: 179/126  Pulse: 86  Temp: 97.7 F (36.5 C)  Resp: 21   Filed Weights   10/20/14 1431  Weight: 181 lb 14.4 oz (82.509 kg)    GENERAL:alert, no distress and comfortable SKIN: skin color, texture, turgor are normal, no rashes or significant lesions EYES: normal, Conjunctiva are pink and non-injected, sclera clear OROPHARYNX:no exudate, no erythema and lips, buccal mucosa, and tongue normal  Musculoskeletal:no cyanosis of digits and no clubbing  NEURO: alert & oriented x 3 with occasional stuttered, no focal motor/sensory deficits  LABORATORY DATA:  I have reviewed the data as listed    Component Value Date/Time   NA 143 09/22/2014 1259   NA 138 01/27/2014 0611   K 4.1 09/22/2014 1259   K 4.4 01/27/2014 0611   CL 99 01/27/2014 0611   CO2 29 09/22/2014 1259   CO2 26 01/27/2014 0611   GLUCOSE 89 09/22/2014 1259   GLUCOSE 115* 01/27/2014 0611   BUN 12.0 09/22/2014 1259   BUN 8 01/27/2014 0611   CREATININE 0.7 09/22/2014 1259   CREATININE 0.48*  01/27/2014 0611   CALCIUM 8.8 09/22/2014 1259   CALCIUM 9.0 01/27/2014 0611   PROT 6.3* 09/22/2014 1259   PROT 6.9 09/20/2013 0455   ALBUMIN 3.6 09/22/2014 1259   ALBUMIN 3.9 09/20/2013 0455   AST 13 09/22/2014 1259   AST 12 09/20/2013 0455   ALT 12 09/22/2014 1259   ALT 8 09/20/2013 0455   ALKPHOS 48 09/22/2014 1259   ALKPHOS 40 09/20/2013 0455   BILITOT 0.47 09/22/2014 1259   BILITOT 0.7 09/20/2013 0455   GFRNONAA >90 01/27/2014 0611   GFRAA >90 01/27/2014 0611    No results found for: SPEP, UPEP  Lab Results  Component Value Date   WBC 4.7 09/22/2014   NEUTROABS 2.2 09/22/2014   HGB 12.6 09/22/2014   HCT 39.4 09/22/2014   MCV 95.4 09/22/2014   PLT 219 09/22/2014      Chemistry      Component Value Date/Time   NA 143 09/22/2014 1259   NA 138 01/27/2014 0611   K 4.1 09/22/2014 1259   K 4.4 01/27/2014 0611   CL 99 01/27/2014 0611   CO2 29  09/22/2014 1259   CO2 26 01/27/2014 0611   BUN 12.0 09/22/2014 1259   BUN 8 01/27/2014 0611   CREATININE 0.7 09/22/2014 1259   CREATININE 0.48* 01/27/2014 0611      Component Value Date/Time   CALCIUM 8.8 09/22/2014 1259   CALCIUM 9.0 01/27/2014 0611   ALKPHOS 48 09/22/2014 1259   ALKPHOS 40 09/20/2013 0455   AST 13 09/22/2014 1259   AST 12 09/20/2013 0455   ALT 12 09/22/2014 1259   ALT 8 09/20/2013 0455   BILITOT 0.47 09/22/2014 1259   BILITOT 0.7 09/20/2013 0455      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 Although Temodar provided stability of her disease on imaging study, it does not appear to have any effect of shrinking or eliminated the abnormalities seen on MRI, which I suspect is related to residual disease. The patient is undergoing evaluation and second opinion at [facility]. I recommend she keeps appointment to see radiation oncology to discuss about the role of radiation therapy. In  the meantime, I recommend she holds Temodar until she have definitive plan of care. I will continue supportive care by providing her medications for pain and her headaches   Intractable chronic cluster headache She had recent injection by her PCP with slight improvement of headache. She is also taking dexamethasone prn. She is taking pain medication and continue to take Fioricet as needed. This is stable. I refill her medications today.   Simple partial seizures She is pleased with her current anti-seizure management by her neurologist.   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 I have ordered the hormonal axis with the results came back abnormal. I am not sure how to proceed with this and recommend endocrinology evaluation and she agreed to proceed.   Low serum cortisol level She has low serum cortisol level which could be a sign of adrenal insufficiency. However, she would need further evaluation such as ACTH level and stimulating test. I recommend endocrinology consultation and she agree. In the meantime, the dexamethasone is helping her headache tremendously and recommend she continues taking that as needed.    Orders Placed This Encounter  Procedures  . Ambulatory referral to Endocrinology    Referral Priority:  Routine    Referral Type:  Consultation    Referral Reason:  Specialty Services Required    Number of Visits Requested:  1   All questions were answered. The patient knows to call the clinic with any problems, questions or concerns. No barriers to learning was detected. I spent 25 minutes counseling the patient face to face. The total time spent in the appointment was 30 minutes and more than 50% was on counseling and review of test results     [facility], [place], MD 10/20/2014 3:12 PM

## 2014-10-20 NOTE — Assessment & Plan Note (Signed)
She is pleased with her current anti-seizure management by her neurologist.

## 2014-10-21 ENCOUNTER — Telehealth: Payer: Self-pay | Admitting: Hematology and Oncology

## 2014-10-21 ENCOUNTER — Telehealth: Payer: Self-pay | Admitting: Radiation Oncology

## 2014-10-21 NOTE — Telephone Encounter (Signed)
lvm for [facility] medical ass. [phone]....sent msg to Blima Singer and Arman Bogus to fax records

## 2014-10-21 NOTE — Telephone Encounter (Signed)
Victoria Bates returned this RN's call confirming she received message reference patient's desire to cancel follow up with [facility] until she had more definitive information from Dr. Luiz Ochoa.

## 2014-10-22 ENCOUNTER — Telehealth: Payer: Self-pay | Admitting: *Deleted

## 2014-10-22 ENCOUNTER — Telehealth: Payer: Self-pay | Admitting: Hematology and Oncology

## 2014-10-22 NOTE — Telephone Encounter (Signed)
Came[facility], is there anyone else that could see her with her insurance?

## 2014-10-22 NOTE — Telephone Encounter (Signed)
Stacy with [facility] [phone]) called and left message that they are unable to accept referral for this patient as they do not accept her insurance.  Will let Dr. Alvy Bimler know.

## 2014-10-22 NOTE — Telephone Encounter (Signed)
Faxed pt medical records to [facility] [phone]

## 2014-10-22 NOTE — Telephone Encounter (Signed)
S/w Natale Milch at [facility] w/ Dr. Elyse Hsu [phone]).  They do accept Medicaid patients.   I faxed over office note and recent lab results to them at fax [phone].  Natale Milch says she will give information to Dr. Elyse Hsu for review and let us know if he can accept the referral.

## 2014-10-24 ENCOUNTER — Telehealth: Payer: Self-pay | Admitting: *Deleted

## 2014-10-24 NOTE — Telephone Encounter (Signed)
Patient called wanting to know if Dr. Alvy Bimler had received an email from Dr. Tommi Rumps in reference to her resuming her Temodar. Patient states that she is confused about this as she was told that the Temodar was not working. Informed patient that Dr. Alvy Bimler was out of the office but that I would send a message to her. Advised patient that if she did not hear from this office by mid day Monday to call us back. Patient verbalized understanding.

## 2014-10-26 ENCOUNTER — Encounter: Payer: Self-pay | Admitting: Neurology

## 2014-10-27 ENCOUNTER — Telehealth: Payer: Self-pay | Admitting: Hematology and Oncology

## 2014-10-27 ENCOUNTER — Telehealth: Payer: Self-pay | Admitting: *Deleted

## 2014-10-27 ENCOUNTER — Other Ambulatory Visit: Payer: Self-pay | Admitting: Hematology and Oncology

## 2014-10-27 NOTE — Telephone Encounter (Signed)
PT. WANTS TO STAY WITH DR.GORSUCH AND BE TREATED AT [facility] BUT IS WILLING TO GO TO [facility] IF TO BETTER UNDERSTAND TREATMENT RECOMMENDATIONS. PT. WAS ASKING IF SURGERY IS OUT OF THE QUESTION. IF THE ANSWER IS YES SHE WANTS TO KNOW WHY.

## 2014-10-27 NOTE — Telephone Encounter (Signed)
I called the patient today. She requested clarification regarding the use of Temodar. I called back to [facility] and the plan is for the patient to be referred to the brain tumor group for second opinion.

## 2014-10-27 NOTE — Telephone Encounter (Signed)
I spoke with the patient and clarified all treatment recommendations

## 2014-10-28 ENCOUNTER — Telehealth: Payer: Self-pay | Admitting: *Deleted

## 2014-10-28 NOTE — Telephone Encounter (Signed)
[facility] REQUESTS A MRI WITHIN TWO WEEKS OF PT.'S VISIT TO [facility] WHICH IS 11/07/14. PT. ALSO HAS SPOKEN WITH DR.CABBELL TODAY AND WOULD LIKE TO SPEAK WITH [facility] ABOUT THEIR DISCUSSION. THIS NOTE ROUTED TO [place].

## 2014-10-29 ENCOUNTER — Encounter: Payer: Self-pay | Admitting: Hematology and Oncology

## 2014-10-29 ENCOUNTER — Other Ambulatory Visit: Payer: Self-pay | Admitting: Hematology and Oncology

## 2014-10-29 ENCOUNTER — Telehealth: Payer: Self-pay | Admitting: *Deleted

## 2014-10-29 ENCOUNTER — Encounter: Payer: Self-pay | Admitting: Oncology

## 2014-10-29 DIAGNOSIS — C711 Malignant neoplasm of frontal lobe: Secondary | ICD-10-CM

## 2014-10-29 NOTE — Telephone Encounter (Signed)
Pt called to report her appt to see Dr. Tommi Rumps at [facility] has been moved up one week to this Friday 4/15.  She needs MRI done prior to that appt.   Benedetto Goad in Managed care is working on getting authorization from [employer].  She was able to get the MRI brain scheduled for tomorrow at 12 noon, but it is not approved yet.   Hopefully it will be approved prior to the MRI tomorrow. Informed pt of MRI at [facility] at 12 noon tomorrow and need to arrive at 11:45 am.   Informed her that we are still working on approval from her insurance company and there is a chance it will not be approved.  She verbalized understanding.  Instructed pt to get MRI on CD before she leaves there to take to [facility].  She verbalized understanding.

## 2014-10-29 NOTE — Progress Notes (Signed)
I placed form for prior auth for morphine on the desk of nurse for dr. Alvy Bimler.

## 2014-10-29 NOTE — Telephone Encounter (Signed)
Pt reports she has appt to see Endocrinologist, Dr. Elyse Hsu on 4/22.  She also needs PA on new ms contin rx.  Informed pt PA was given to Raquel in managed care dept and may take a few days to be approved.  Pt says she has enough left for a few days.

## 2014-10-29 NOTE — Telephone Encounter (Signed)
TCF patient stating her appt. @ [facility] has been moved to this Friday 94/15/16) @ 9am   She states she needs to have Brain MRI done prior to that appt. And thinks that the prior authorization will take too long and needs it expedited. Call made to [place], desk nurse and she will look into it.

## 2014-10-29 NOTE — Telephone Encounter (Signed)
MRI ordered

## 2014-10-30 ENCOUNTER — Ambulatory Visit (HOSPITAL_COMMUNITY)
Admission: RE | Admit: 2014-10-30 | Discharge: 2014-10-30 | Disposition: A | Discharge status: Home / Self Care | Payer: Medicaid Other | Source: Ambulatory Visit | Attending: Hematology and Oncology | Admitting: Hematology and Oncology

## 2014-10-30 DIAGNOSIS — C711 Malignant neoplasm of frontal lobe: Secondary | ICD-10-CM | POA: Insufficient documentation

## 2014-10-30 MED ORDER — GADOBENATE DIMEGLUMINE 529 MG/ML IV SOLN
17.0000 mL | Freq: Once | INTRAVENOUS | Status: AC | PRN
  Administered 2014-10-30: 17 mL via INTRAVENOUS

## 2014-10-31 ENCOUNTER — Encounter: Payer: Self-pay | Admitting: Hematology and Oncology

## 2014-10-31 ENCOUNTER — Telehealth: Payer: Self-pay | Admitting: *Deleted

## 2014-10-31 ENCOUNTER — Encounter: Payer: Self-pay | Admitting: *Deleted

## 2014-10-31 MED ORDER — MORPHINE SULFATE ER 30 MG PO TBCR: 30.0000 mg | EXTENDED_RELEASE_TABLET | Freq: Two times a day (BID) | ORAL | Status: DC

## 2014-10-31 NOTE — Telephone Encounter (Signed)
error 

## 2014-10-31 NOTE — Progress Notes (Signed)
I faxed form for prior auth for morphine  [address]

## 2014-10-31 NOTE — Telephone Encounter (Signed)
Victoria Bates called "we are at [facility].  She is completely out of her pain med.  Has the Morphine prescription been taken care of?  Communication problem exist as she is out of the morphine and has an unbelievable, excruciating headache and needs the Morphine 30 mg."    Informed him the prescription is ready for pick up but to call [facility] to make sure the 15 mg has or has not been approved.  "She doesn't want to cut the dose in half but she says she will take the 30 mg and spread out more than every twelve hours."  [name] confirms she wants to decrease the frequency not the dose.  Will notify Dr. Alvy Bimler staff.

## 2014-10-31 NOTE — Telephone Encounter (Signed)
Pt left VM this morning, state she still waiting on Prior Auth on new rx for ms contin 15 mg.  Insurance required a new PA due to dose change from 30 mg to 15 mg.  Pt thought she had enough ms contin left to wait a few more days until med approved, but now says she was wrong and is out of ms contin altogether she only has the ms ir left.  She asks for Rx to be changed back to 30 mg to avoid need for PA on the new dose.  She says pharmacy told her that if dose stayed the same it would not require a PA.  Dr. Alvy Bimler signed rx for MS Contin 30 mg and left for pt to pick up.

## 2014-10-31 NOTE — Telephone Encounter (Signed)
Received VM message from patient this am regarding long acting and short pain medications, need for prior authorization.  Call routed to [place], RN with Dr. Alvy Bimler.

## 2014-11-05 ENCOUNTER — Ambulatory Visit: Payer: Medicaid Other | Admitting: Neurology

## 2014-11-05 ENCOUNTER — Telehealth: Payer: Self-pay | Admitting: *Deleted

## 2014-11-05 ENCOUNTER — Telehealth: Payer: Self-pay | Admitting: Neurology

## 2014-11-05 NOTE — Telephone Encounter (Signed)
Pt wants to talk to someone about coming off the medication that she is taking due to it not really helping please call her at [phone]

## 2014-11-05 NOTE — Telephone Encounter (Signed)
I told her Ritalin could potentially cause her tachycardia to worsen, as well as the seizures. Received notes from [facility], agree with seeing [facility] and discussing medications with them. Ok to start reducing and tapering off Lamictal. Thanks

## 2014-11-05 NOTE — Telephone Encounter (Signed)
She wants to come off of Lamictal. She states that her headaches are worse since being on medication. She also states that you spoke with her about possibly going on Ritalin. Please advise.

## 2014-11-05 NOTE — Telephone Encounter (Signed)
I called patient and notified of below advisement. Advised to start tapering off Lamictal 25 mg as follows: take 1/2 tablet for 1 week then stop.

## 2014-11-05 NOTE — Telephone Encounter (Signed)
S/w pt yesterday.  She reports Dr. Ferdinand Lango at [facility] recommends she hold Temodar until she sees her again in 8 weeks.  Pt will return to [facility] to see Dr. Ferdinand Lango and have MRI brain in 8 weeks.  Pt also informed that she has appt to see Endocrinologist, Dr. Elyse Hsu this Friday 4/22.   Notified Dr. Alvy Bimler.  Pt to be scheduled back here for f/u after she sees Dr. Ferdinand Lango again.  Instructed pt to call us if she needs anything or any refills on pain meds.  Pt verbalized understanding and says she tried to get the Rx for Ms Contin 30 mg filled and it also required a Prior Auth so she had to pay for it out of pocket and was only able to afford to pay for half the Rx.  She says she asked if the 15 mg had been approved yet and they had not. Our office has not received request for PA for the MS Contin 30 mg (assumed it didn't need one since pt was previously on that dose).    [facility]ed [facility] and they confirm that both the ms contin 15 mg and 30 mg were requiring prior auth and they have not received approval on either yet.  Pt paid and picked up #30 tabs of the 15 mg ms contin, so has a two week supply at this time.  Asked pharmacist to please fax [place] the request for PA on ms contin 30 mg so we can work on it.  She says she will fax it.

## 2014-11-06 ENCOUNTER — Telehealth: Payer: Self-pay

## 2014-11-06 NOTE — Telephone Encounter (Signed)
Pa request for MS ER 30 received and forwarded to Raquel

## 2014-11-07 ENCOUNTER — Encounter: Payer: Self-pay | Admitting: Hematology and Oncology

## 2014-11-07 ENCOUNTER — Telehealth: Payer: Self-pay | Admitting: *Deleted

## 2014-11-07 NOTE — Telephone Encounter (Signed)
Message from Triage states [facility] left message stating the morphine that has been approved is for the morphine IR not the ER.  Pt needs PA for ms contin 30 mg ER.   I went to Raquel in managed care to please get PA on the ms contin needed today.   Pt also left a VM for nurse, sounding tearful reporting pharmacy says the ms ER still not approved.  She is running out and needs to have it filled, but cannot w/o the PA.

## 2014-11-07 NOTE — Progress Notes (Signed)
I spoke with Claiborne Billings at [employer] and the morphine was approved 10/31/14-10/26/15  Case# [id number].

## 2014-11-07 NOTE — Progress Notes (Signed)
Per Valetta Fuller.Marland Kitchenapproval for morphine sulf er 30mg  tablet approved 11/07/14-11/02/15  Pa# [id number]. [facility] to advise. They ran and it will be ready in about 1 hour. I will advised cameo

## 2014-11-07 NOTE — Telephone Encounter (Signed)
Informed pt of MS contin 30 mg approved and [facility] told [facility] it should be ready for pick up in about one hour.  Pt verbalized understanding.  She also requests office notes and labs to take to Dr. Altheimer's office.  States his office canceled her appt today and it is r/s to Tuesday.  She will come by and pick up notes and labs to take w/ her to appt... Informed pt I did fax all that to Dr. Elyse Hsu on 4/6.  I will leave the office note and labs I actually faxed for pt to pick up at front desk.  She verbalized understanding.

## 2014-11-07 NOTE — Progress Notes (Signed)
I called [facility] and spoke with natalie to advise scrip was approved for patient.

## 2014-11-10 ENCOUNTER — Other Ambulatory Visit: Payer: Self-pay | Admitting: Hematology and Oncology

## 2014-11-11 ENCOUNTER — Telehealth: Payer: Self-pay | Admitting: *Deleted

## 2014-11-11 NOTE — Telephone Encounter (Signed)
Patient states that her endocrinologist wants her to stop taking her dexamethasone and start on cortisone. Patient would like MD opinion before proceeding with this medication. Message sent to MD and Nurse.

## 2014-11-12 ENCOUNTER — Telehealth: Payer: Self-pay | Admitting: Hematology and Oncology

## 2014-11-12 ENCOUNTER — Telehealth: Payer: Self-pay

## 2014-11-12 NOTE — Telephone Encounter (Signed)
I spoke with the patient over the telephone. She is concerned about her recent plan by endocrinologist to switch her from dexamethasone to hydrocortisone. I reassured the patient that it is to correct course of action. I reassured the patient I will continue to manage her pain.

## 2014-11-12 NOTE — Telephone Encounter (Signed)
Pt is looking for Dr Alvy Bimler input. Dr altheimer is pulling her off dexamethasone cold [place]. This is in coordination with Dr Ferdinand Lango at [facility]. Dr altheimer is putting her on hydrocortisone 20 mg at 8am, 10 mg at 4 pm. Dr altheimer kept telling her that her headaches are going to be worse, this concerns her. June 14th another MRI to be done at [facility]. Right now Dr Alvy Bimler is prescribing pain meds, Dr Alvy Bimler made referral to pain clinic in the past, but pt never heard back. Does Dr Alvy Bimler want to continue managing the pain. Pt is close to finishing last Rx of fioracet.  Pt was unable answer call earlier b/c she was in MD office for her son.

## 2014-11-13 ENCOUNTER — Other Ambulatory Visit: Payer: Self-pay | Admitting: *Deleted

## 2014-11-13 ENCOUNTER — Telehealth: Payer: Self-pay | Admitting: *Deleted

## 2014-11-13 DIAGNOSIS — C719 Malignant neoplasm of brain, unspecified: Secondary | ICD-10-CM

## 2014-11-13 DIAGNOSIS — C711 Malignant neoplasm of frontal lobe: Secondary | ICD-10-CM

## 2014-11-13 DIAGNOSIS — G44011 Episodic cluster headache, intractable: Secondary | ICD-10-CM

## 2014-11-13 MED ORDER — BUTALBITAL-APAP-CAFFEINE 50-325-40 MG PO TABS: 1.0000 | ORAL_TABLET | Freq: Four times a day (QID) | ORAL | Status: DC | PRN

## 2014-11-13 NOTE — Telephone Encounter (Signed)
This RN called refill to pharmacy per not able to e scribe.

## 2014-11-13 NOTE — Telephone Encounter (Signed)
Pt left message stating need to obtain refill for fioricet.  Per message pt states she contacted her pharmacy and they have sent the request to Dr Christella Noa " but Dr Alvy Bimler ordered this for me so I am contacting your office."  Noted per meds fioricet #90 tabs prescribed by Dr Alvy Bimler on 10/20/2014.  Refill sent to pharmacy per review of MD notes

## 2014-11-26 ENCOUNTER — Telehealth: Payer: Self-pay | Admitting: *Deleted

## 2014-11-26 MED ORDER — MORPHINE SULFATE 15 MG PO TABS: 15.0000 mg | ORAL_TABLET | Freq: Four times a day (QID) | ORAL | Status: DC | PRN

## 2014-11-26 NOTE — Telephone Encounter (Signed)
Husband called requesting refill for MSIR 15 mg. Prescription on Dr. Alvy Bimler desk. Please call husband @ [phone] when ready for pick up

## 2014-12-04 ENCOUNTER — Other Ambulatory Visit: Payer: Self-pay | Admitting: Hematology and Oncology

## 2014-12-05 ENCOUNTER — Other Ambulatory Visit: Payer: Self-pay | Admitting: *Deleted

## 2014-12-05 NOTE — Telephone Encounter (Signed)
[facility] [address] [facility]'s [facility]ior[facility]et.   (Dr. Alvy Bimler received electronic refill request from pharmacy but med cannot be escribed. )

## 2014-12-07 ENCOUNTER — Other Ambulatory Visit: Payer: Self-pay | Admitting: Neurology

## 2014-12-08 NOTE — Telephone Encounter (Signed)
Depakote refill requested. Per last office note- patient to remain on medication. Refill approved and sent to patient's pharmacy.

## 2014-12-12 ENCOUNTER — Other Ambulatory Visit: Payer: Self-pay | Admitting: *Deleted

## 2014-12-12 ENCOUNTER — Telehealth: Payer: Self-pay | Admitting: *Deleted

## 2014-12-12 MED ORDER — MORPHINE SULFATE ER 30 MG PO TBCR: 30.0000 mg | EXTENDED_RELEASE_TABLET | Freq: Two times a day (BID) | ORAL | Status: DC

## 2014-12-12 NOTE — Telephone Encounter (Signed)
VM message from pt requesting refill of her MS Contin 30 mg.  Husband will pick prescription. Call him @ [phone] when script is ready.

## 2014-12-19 ENCOUNTER — Ambulatory Visit: Payer: Medicaid Other | Admitting: Neurology

## 2014-12-22 ENCOUNTER — Other Ambulatory Visit: Payer: Self-pay | Admitting: Hematology and Oncology

## 2014-12-22 ENCOUNTER — Telehealth: Payer: Self-pay | Admitting: *Deleted

## 2014-12-22 MED ORDER — MORPHINE SULFATE 15 MG PO TABS: 15.0000 mg | ORAL_TABLET | Freq: Four times a day (QID) | ORAL | Status: DC | PRN

## 2014-12-22 MED ORDER — BUTALBITAL-APAP-CAFFEINE 50-325-40 MG PO TABS: ORAL_TABLET | ORAL | Status: DC

## 2014-12-22 NOTE — Telephone Encounter (Signed)
I will refill her medications I recommend waiting until further information w repeat MRI first before deciding anything

## 2014-12-22 NOTE — Telephone Encounter (Signed)
CALL RECEIVED FROM [facility] "REQUESTING REFILLS.  GOING OUT OF TOWN FRIDAY AND WOULD LIKE SHORT ACTING MORPHINE AND FIORICET TODAY OR TOMORROW.  Scheduled 01-05-2015 for MRI at 0730 at [facility], see surgeon and oncologist.  Would like to know if Dr. Alvy Bimler would like to see her to discuss what's being discussed at [facility].  I believe they want to place me on a trial and i don't want to do it."  Return number [phone].

## 2014-12-22 NOTE — Telephone Encounter (Signed)
Left message on patient's personal VM with message below

## 2014-12-25 ENCOUNTER — Other Ambulatory Visit (HOSPITAL_COMMUNITY): Payer: Self-pay | Admitting: Orthopedic Surgery

## 2014-12-25 DIAGNOSIS — M25532 Pain in left wrist: Secondary | ICD-10-CM

## 2015-01-07 ENCOUNTER — Ambulatory Visit (HOSPITAL_COMMUNITY)
Admission: RE | Admit: 2015-01-07 | Discharge: 2015-01-07 | Disposition: A | Discharge status: Home / Self Care | Payer: Medicaid Other | Source: Ambulatory Visit | Attending: Orthopedic Surgery | Admitting: Orthopedic Surgery

## 2015-01-07 DIAGNOSIS — R937 Abnormal findings on diagnostic imaging of other parts of musculoskeletal system: Secondary | ICD-10-CM | POA: Insufficient documentation

## 2015-01-07 DIAGNOSIS — M25532 Pain in left wrist: Secondary | ICD-10-CM | POA: Diagnosis present

## 2015-01-07 DIAGNOSIS — M899 Disorder of bone, unspecified: Secondary | ICD-10-CM | POA: Diagnosis not present

## 2015-01-07 DIAGNOSIS — M659 Synovitis and tenosynovitis, unspecified: Secondary | ICD-10-CM | POA: Insufficient documentation

## 2015-01-10 ENCOUNTER — Other Ambulatory Visit: Payer: Self-pay | Admitting: Hematology and Oncology

## 2015-01-15 ENCOUNTER — Telehealth: Payer: Self-pay | Admitting: *Deleted

## 2015-01-15 ENCOUNTER — Other Ambulatory Visit: Payer: Self-pay | Admitting: Hematology and Oncology

## 2015-01-15 DIAGNOSIS — E2740 Unspecified adrenocortical insufficiency: Secondary | ICD-10-CM

## 2015-01-15 DIAGNOSIS — C711 Malignant neoplasm of frontal lobe: Secondary | ICD-10-CM

## 2015-01-15 DIAGNOSIS — G40109 Localization-related (focal) (partial) symptomatic epilepsy and epileptic syndromes with simple partial seizures, not intractable, without status epilepticus: Secondary | ICD-10-CM

## 2015-01-15 DIAGNOSIS — K769 Liver disease, unspecified: Secondary | ICD-10-CM

## 2015-01-15 DIAGNOSIS — R7989 Other specified abnormal findings of blood chemistry: Secondary | ICD-10-CM

## 2015-01-15 MED ORDER — BUTALBITAL-APAP-CAFFEINE 50-325-40 MG PO TABS: ORAL_TABLET | ORAL | Status: DC

## 2015-01-15 MED ORDER — MORPHINE SULFATE 15 MG PO TABS: 15.0000 mg | ORAL_TABLET | Freq: Four times a day (QID) | ORAL | Status: DC | PRN

## 2015-01-15 NOTE — Telephone Encounter (Signed)
POF sent PLease let her know she needs to be fasting for cortisol

## 2015-01-15 NOTE — Telephone Encounter (Signed)
Just did fioricet recently MSIR ready

## 2015-01-15 NOTE — Telephone Encounter (Signed)
Informed pt appointment times.  She wants to see Dr. Alvy Bimler on Tuesday 7/5 at 12 noon.  She asks for lab appt also and will come at 11:30 am for lab.  She requests labs for [facility] for Cortisol level, Depakote level, Liver Enzymes, Vitamin D and Vitamin B12.   States she was in hospital for a "epilespsy study" and they wanted her to have these labs done when she returned to [place].  She read them off her d/c paperwork.

## 2015-01-15 NOTE — Telephone Encounter (Signed)
Pt left VM states needs appt to see Dr. Alvy Bimler as she had had MRI and saw Oncologist at [facility].  She also needs refill on Fioricet and MS IR 15 mg.

## 2015-01-16 ENCOUNTER — Telehealth: Payer: Self-pay | Admitting: *Deleted

## 2015-01-16 ENCOUNTER — Telehealth: Payer: Self-pay | Admitting: Hematology and Oncology

## 2015-01-16 NOTE — Telephone Encounter (Signed)
LVM for pt informing of need to fast prior to cortisol level on 7/5.  Asked her to return call if any questions.

## 2015-01-16 NOTE — Telephone Encounter (Signed)
lvm for pt regarding to added lab [id number].Marland KitchenMarland KitchenMarland Kitchen

## 2015-01-20 ENCOUNTER — Telehealth: Payer: Self-pay | Admitting: Hematology and Oncology

## 2015-01-20 ENCOUNTER — Encounter: Payer: Self-pay | Admitting: Hematology and Oncology

## 2015-01-20 ENCOUNTER — Other Ambulatory Visit (HOSPITAL_BASED_OUTPATIENT_CLINIC_OR_DEPARTMENT_OTHER): Payer: Medicaid Other

## 2015-01-20 ENCOUNTER — Ambulatory Visit (HOSPITAL_BASED_OUTPATIENT_CLINIC_OR_DEPARTMENT_OTHER): Payer: Medicaid Other | Admitting: Hematology and Oncology

## 2015-01-20 VITALS — BP 111/77 | HR 85 | Temp 97.9°F | Resp 19 | Ht 69.0 in | Wt 178.8 lb

## 2015-01-20 DIAGNOSIS — C711 Malignant neoplasm of frontal lobe: Secondary | ICD-10-CM

## 2015-01-20 DIAGNOSIS — K769 Liver disease, unspecified: Secondary | ICD-10-CM

## 2015-01-20 DIAGNOSIS — R202 Paresthesia of skin: Secondary | ICD-10-CM

## 2015-01-20 DIAGNOSIS — G40109 Localization-related (focal) (partial) symptomatic epilepsy and epileptic syndromes with simple partial seizures, not intractable, without status epilepticus: Secondary | ICD-10-CM | POA: Diagnosis not present

## 2015-01-20 DIAGNOSIS — R51 Headache: Secondary | ICD-10-CM

## 2015-01-20 DIAGNOSIS — R519 Headache, unspecified: Secondary | ICD-10-CM

## 2015-01-20 DIAGNOSIS — R110 Nausea: Secondary | ICD-10-CM

## 2015-01-20 DIAGNOSIS — E2740 Unspecified adrenocortical insufficiency: Secondary | ICD-10-CM

## 2015-01-20 DIAGNOSIS — R7989 Other specified abnormal findings of blood chemistry: Secondary | ICD-10-CM

## 2015-01-20 DIAGNOSIS — R4702 Dysphasia: Secondary | ICD-10-CM

## 2015-01-20 DIAGNOSIS — R079 Chest pain, unspecified: Secondary | ICD-10-CM

## 2015-01-20 DIAGNOSIS — G44021 Chronic cluster headache, intractable: Secondary | ICD-10-CM | POA: Diagnosis not present

## 2015-01-20 LAB — CBC WITH DIFFERENTIAL/PLATELET
BASO%: 0.6 % (ref 0.0–2.0)
Basophils Absolute: 0 10*3/uL (ref 0.0–0.1)
EOS%: 0.5 % (ref 0.0–7.0)
Eosinophils Absolute: 0 10*3/uL (ref 0.0–0.5)
HCT: 41.1 % (ref 34.8–46.6)
HGB: 13.7 g/dL (ref 11.6–15.9)
LYMPH%: 14.9 % (ref 14.0–49.7)
MCH: 31.5 pg (ref 25.1–34.0)
MCHC: 33.3 g/dL (ref 31.5–36.0)
MCV: 94.6 fL (ref 79.5–101.0)
MONO#: 0.3 10*3/uL (ref 0.1–0.9)
MONO%: 4.7 % (ref 0.0–14.0)
NEUT#: 4.4 10*3/uL (ref 1.5–6.5)
NEUT%: 79.3 % — ABNORMAL HIGH (ref 38.4–76.8)
Platelets: 221 10*3/uL (ref 145–400)
RBC: 4.35 10*6/uL (ref 3.70–5.45)
RDW: 12.3 % (ref 11.2–14.5)
WBC: 5.5 10*3/uL (ref 3.9–10.3)
lymph#: 0.8 10*3/uL — ABNORMAL LOW (ref 0.9–3.3)

## 2015-01-20 LAB — COMPREHENSIVE METABOLIC PANEL (CC13)
ALT: 14 U/L (ref 0–55)
AST: 14 U/L (ref 5–34)
Albumin: 4 g/dL (ref 3.5–5.0)
Alkaline Phosphatase: 51 U/L (ref 40–150)
Anion Gap: 9 mEq/L (ref 3–11)
BUN: 10 mg/dL (ref 7.0–26.0)
CO2: 26 mEq/L (ref 22–29)
Calcium: 9.4 mg/dL (ref 8.4–10.4)
Chloride: 107 mEq/L (ref 98–109)
Creatinine: 0.7 mg/dL (ref 0.6–1.1)
EGFR: >90 mL/min/{1.73_m2} (ref >90)
Glucose: 87 mg/dl (ref 70–140)
Potassium: 4.1 mEq/L (ref 3.5–5.1)
Sodium: 142 mEq/L (ref 136–145)
Total Bilirubin: 0.31 mg/dL (ref 0.20–1.20)
Total Protein: 6.9 g/dL (ref 6.4–8.3)

## 2015-01-20 NOTE — Progress Notes (Signed)
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   09/27/2013 Pathology Results Accession: [id number] pathology showed grade 2 oligodendroglioma with mutant IDH1 protein, ATRX, OLIG 2 expression. p53 is negative, low Ki-67 labeling index. 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   11/06/2013 Imaging Repeat CT scan of the head showed resolution of abscess.   01/23/2014 Surgery Dr. Christella Noa perform a subtotal gross resection of the tumor.   01/24/2014 Imaging Repeat imaging studies showed persistent and residual disease.   03/03/2014 -  Chemotherapy She was started on Temodar, 5 days every 28 days   03/13/2014 Imaging Repeat MRI showed no evidence of progression of disease.   07/14/2014 Imaging Repeat MRI showed no evidence of disease progression.   09/12/2014 Imaging MRI of the liver show mild regression of the size of lesions, presumed to be benign   09/15/2014 Imaging Repeat MRI of the brain show persistent abnormalities, suspicious for residual disease    INTERVAL HISTORY: Please see below for problem oriented charting. She is seen today as part of her regular follow-up. She have repeat MRI scan at [facility] every 8 weeks which showed stable disease. She is currently on a slow corticosteroids taper. She have recent EEG study and is on high-dose Depakote and gabapentin for seizure control. She denies new neurological deficit  REVIEW OF SYSTEMS:   Constitutional: Denies fevers, chills or abnormal weight loss Eyes: Denies blurriness of vision Ears, nose, mouth, throat, and face: Denies  mucositis or sore throat Respiratory: Denies cough, dyspnea or wheezes Cardiovascular: Denies palpitation, chest discomfort or lower extremity swelling Gastrointestinal:  Denies nausea, heartburn or change in bowel habits Skin: Denies abnormal skin rashes Lymphatics: Denies new lymphadenopathy or easy bruising Neurological:Denies numbness, tingling or new weaknesses Behavioral/Psych: Mood is stable, no new changes  All other systems were reviewed with the patient and are negative.  I have reviewed the past medical history, past surgical history, social history and family history with the patient and they are unchanged from previous note.  ALLERGIES:  is allergic to adhesive; latex; percocet; shellfish allergy; soma; and vancomycin.  MEDICATIONS:  Current Outpatient Prescriptions  Medication Sig Dispense Refill  . albuterol (PROVENTIL HFA;VENTOLIN HFA) 108 (90 BASE) MCG/ACT inhaler Inhale 2 puffs into the lungs every 6 (six) hours as needed for wheezing or shortness of breath.     . ALPRAZolam (XANAX) 1 MG tablet Take 1 mg by mouth 2 (two) times daily as needed (for seizures).     . butalbital-acetaminophen-caffeine (FIORICET, ESGIC) 50-325-40 MG per tablet TAKE 1 TABLET BY MOUTH EVERY 6 HOURS AS NEEDED FOR HEADACHE 90 tablet 0  . carisoprodol (SOMA) 350 MG tablet Take 350 mg by mouth 4 (four) times daily. Takes with Keppra for seizure prevention.    . cetirizine (ZYRTEC) 10 MG tablet Take 10 mg by mouth daily.    . divalproex (DEPAKOTE ER) 250 MG 24 hr tablet TAKE 2 TABLETS EVERY AM AND 3 TABLETS EVERY PM 150 tablet 2  . gabapentin (NEURONTIN) 300 MG capsule Take 3 capsules in the morning and 3 capsules in the evening 180 capsule 2  . morphine (MSIR) 15 MG  tablet Take 1 tablet (15 mg total) by mouth every 6 (six) hours as needed for severe pain. 90 tablet 0  . ondansetron (ZOFRAN) 8 MG tablet Take 1 tablet (8 mg total) by mouth every 8 (eight) hours as needed for nausea or vomiting. 30  tablet 3   No current facility-administered medications for this visit.    PHYSICAL EXAMINATION: ECOG PERFORMANCE STATUS: 0 - Asymptomatic  Filed Vitals:   01/20/15 1217  BP: 111/77  Pulse: 85  Temp: 97.9 F (36.6 C)  Resp: 19   Filed Weights   01/20/15 1217  Weight: 178 lb 12.8 oz (81.103 kg)    GENERAL:alert, no distress and comfortable SKIN: skin color, texture, turgor are normal, no rashes or significant lesions EYES: normal, Conjunctiva are pink and non-injected, sclera clear Musculoskeletal:no cyanosis of digits and no clubbing  NEURO: alert & oriented x 3 with fluent speech, no focal motor/sensory deficits  LABORATORY DATA:  I have reviewed the data as listed    Component Value Date/Time   NA 142 01/20/2015 1202   NA 138 01/27/2014 0611   K 4.1 01/20/2015 1202   K 4.4 01/27/2014 0611   CL 99 01/27/2014 0611   CO2 26 01/20/2015 1202   CO2 26 01/27/2014 0611   GLUCOSE 87 01/20/2015 1202   GLUCOSE 115* 01/27/2014 0611   BUN 10.0 01/20/2015 1202   BUN 8 01/27/2014 0611   CREATININE 0.7 01/20/2015 1202   CREATININE 0.48* 01/27/2014 0611   CALCIUM 9.4 01/20/2015 1202   CALCIUM 9.0 01/27/2014 0611   PROT 6.9 01/20/2015 1202   PROT 6.9 09/20/2013 0455   ALBUMIN 4.0 01/20/2015 1202   ALBUMIN 3.9 09/20/2013 0455   AST 14 01/20/2015 1202   AST 12 09/20/2013 0455   ALT 14 01/20/2015 1202   ALT 8 09/20/2013 0455   ALKPHOS 51 01/20/2015 1202   ALKPHOS 40 09/20/2013 0455   BILITOT 0.31 01/20/2015 1202   BILITOT 0.7 09/20/2013 0455   GFRNONAA >90 01/27/2014 0611   GFRAA >90 01/27/2014 0611    No results found for: SPEP, UPEP  Lab Results  Component Value Date   WBC 5.5 01/20/2015   NEUTROABS 4.4 01/20/2015   HGB 13.7 01/20/2015   HCT 41.1 01/20/2015   MCV 94.6 01/20/2015   PLT 221 01/20/2015      Chemistry      Component Value Date/Time   NA 142 01/20/2015 1202   NA 138 01/27/2014 0611   K 4.1 01/20/2015 1202   K 4.4 01/27/2014 0611   CL 99  01/27/2014 0611   CO2 26 01/20/2015 1202   CO2 26 01/27/2014 0611   BUN 10.0 01/20/2015 1202   BUN 8 01/27/2014 0611   CREATININE 0.7 01/20/2015 1202   CREATININE 0.48* 01/27/2014 0611      Component Value Date/Time   CALCIUM 9.4 01/20/2015 1202   CALCIUM 9.0 01/27/2014 0611   ALKPHOS 51 01/20/2015 1202   ALKPHOS 40 09/20/2013 0455   AST 14 01/20/2015 1202   AST 12 09/20/2013 0455   ALT 14 01/20/2015 1202   ALT 8 09/20/2013 0455   BILITOT 0.31 01/20/2015 1202   BILITOT 0.7 09/20/2013 0455       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 So far, report suggests that she has stable disease. The patient was told surgery is not an option  right now. Given the stability of her imaging and the fact that she is being monitored closely and she continues on low-dose corticosteroids, I reassured the patient that this is not an unreasonable plan. I will continue to provide supportive care visits as needed.  Simple partial seizures She has continuous EEG study recently and remain on Depakote and Neurontin for seizure control. I will defer to a neurologist at [facility] for further management.  Intractable chronic cluster headache Her headaches is currently well-controlled with morphine as needed and Fioricet. I refill her prescription last week. Continue the same management.   No orders of the defined types were placed in this encounter.   All questions were answered. The patient knows to call the clinic with any problems, questions or concerns. No barriers to learning was detected. I spent 15 minutes counseling the patient face to face. The total time spent in the appointment was 20 minutes and more than 50% was on counseling and review of test results     [facility], [place], MD 01/20/2015 1:22 PM

## 2015-01-20 NOTE — Telephone Encounter (Signed)
Printed avs for pt

## 2015-01-20 NOTE — Assessment & Plan Note (Signed)
Her headaches is currently well-controlled with morphine as needed and Fioricet. I refill her prescription last week. Continue the same management.

## 2015-01-20 NOTE — Assessment & Plan Note (Signed)
She has continuous EEG study recently and remain on Depakote and Neurontin for seizure control. I will defer to a neurologist at [facility] for further management.

## 2015-01-20 NOTE — Assessment & Plan Note (Signed)
She is following at [facility] with imaging study every 8 weeks. So far, report suggests that she has stable disease. The patient was told surgery is not an option right now. Given the stability of her imaging and the fact that she is being monitored closely and she continues on low-dose corticosteroids, I reassured the patient that this is not an unreasonable plan. I will continue to provide supportive care visits as needed.

## 2015-01-21 ENCOUNTER — Ambulatory Visit (INDEPENDENT_AMBULATORY_CARE_PROVIDER_SITE_OTHER): Payer: Medicaid Other | Admitting: Neurology

## 2015-01-21 ENCOUNTER — Encounter: Payer: Self-pay | Admitting: Neurology

## 2015-01-21 VITALS — BP 132/80 | HR 74 | Wt 178.0 lb

## 2015-01-21 DIAGNOSIS — C719 Malignant neoplasm of brain, unspecified: Secondary | ICD-10-CM

## 2015-01-21 DIAGNOSIS — G40109 Localization-related (focal) (partial) symptomatic epilepsy and epileptic syndromes with simple partial seizures, not intractable, without status epilepticus: Secondary | ICD-10-CM | POA: Diagnosis not present

## 2015-01-21 LAB — VITAMIN D 25 HYDROXY (VIT D DEFICIENCY, FRACTURES): Vit D, 25-Hydroxy: 30 ng/mL (ref 30–100)

## 2015-01-21 LAB — VALPROIC ACID LEVEL: Valproic Acid Lvl: 36 ug/mL — ABNORMAL LOW (ref 50.0–100.0)

## 2015-01-21 LAB — VITAMIN B12: Vitamin B-12: 811 pg/mL (ref 211–911)

## 2015-01-21 LAB — CORTISOL: Cortisol, Plasma: 29.3 ug/dL

## 2015-01-21 NOTE — Patient Instructions (Signed)
1. Continue all your medications, discuss with Dr. Virgina Evener next steps 2. Follow-up on an as needed basis, call our office for any concerns

## 2015-01-21 NOTE — Progress Notes (Signed)
NEUROLOGY FOLLOW UP OFFICE NOTE  Victoria Bates [id number]  HISTORY OF PRESENT ILLNESS: I had the pleasure of seeing Victoria Bates in follow-up in the neurology clinic on 01/21/2015.  The patient was last seen 2 months ago for seizures. She has a history of a right frontal oligodendroglioma s/p subtotal resection. Records from [facility] were reviewed. Since her last visit, she self-increased Depakote to 516m in AM, 7547min PM, and continues to take gabapentin 90067mID. She feels that her symptoms improved on higher dose. She has been evaluated by [facility], and underwent inpatient video EEG monitoring for 3 days for spell evaluation. She had reported spell types of heart fluttering, tremors, jerks in speech around 4 of 7 days, and blood pressure and heart rate going up to the 200s/100s followed by "crashing" afterwards, resting heart rate in the 200s. With the crash she feels tired and hungry, occurring 2-4 times a week, sometimes with loss of recall. During her hospital stay from June 23 to 25, Depakote and Gabapentin were held. Typical events were not clearly captured, however she reported 2 spells where she reported dizziness and visual changes (tiles on floor looked uneven) with no EEG correlate. Baseline EEG showed breach rhythm over the right hemisphere, no epileptiform discharges or electrographic seizures. She was discharged on home doses of Depakote and Gabapentin and instructed to follow-up in the epilepsy clinic for further management/medication titration. She has a follow-up with Dr. LueVirgina Evener 7/26. Since her hospital stay, she has had only a "mild" one last weekend. She continues to have a mild headache daily, starting over the right temple, behind her eye, radiating down her neck. She also has neck pain. Her left arm always feels weak, but reports that symptoms are stable. She is noted to be less anxious in the office today.   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 [facility] due to left vision changes and left facial heaviness. She reports that she had a 60-lb unintentional weight loss 4 months leading up to her admission, she was vomiting after every meal. She started having episodes where her hands would have low amplitude tremors and she feels the right lower face twitching. She would need to pause and take a minute to get words out. If she did not stop what she was doing, the symptoms could last for hours. She would feel like her body was "going 90 mph" with palpitations up to 200 bpm. She was started on Xanax for anxiety, but this did not help. She then started having vision changes on her left eye, she would park crookedly when she thought she was going straight. She saw an ophthalmologist, eye exam was normal but left optic neuritis was questioned and brain imaging recommended. She then started to have left facial heaviness and numbness on the left face, arm, and leg, [facility]. She underwent subtotal resection on 01/23/14 by Dr. CabChristella Noad was started on Keppra for seizure prophylaxis at that time. She continued to have the recurrent episodes of tremors in both arms, palpitations, and right facial tightness, as well as episodes of left face, neck, shoulder, and arm numbness, going down the left thumb and index finger. She describes her major seizures as "all of that times 100." After the  episodes, she would be tired for a few days, wanting to sleep, with no energy. She has noticed that her short term memory is "gone." Keppra dose had been increased up to 2 grams daily, and this had caused a change in personality. She had seen neurologist Dr. Leonie Man, who switched her to Depakote. She tapered off Keppra and is currently on Depakote ER 515m BID with significant  improvement in symptoms. She denies any side effects to the Depakote.   She has a history of recurrent headaches since her teenage years. Headaches are usually over the right temporal and occipital region, with sharp stabbing and throbbing pain, with a knot in her left neck region. There is occasional nausea, photo, and phonophobia. She was having these headaches around 1-2 times a week when younger, however since December 2014, headaches have been occurring around 2-3 times a week. She had been receiving injections in her neck for this. If she takes medication at the onset, they last a few hours, however they may last up to several days. She has found that taking Decadron for a couple of days has been the only helpful medication. She takes Fioricet rarely. Zomig, aspirin, and Tylenol were ineffective in the past. Her father and 162year old son have headaches. She denies any diplopia, dysarthria, dysphagia, bowel/bladder dysfunction. She has chronic back pain and had been taking Soma qid for several years, with worsening of pain when she tried reducing this. She was started on Gabapentin for the seizures and back pain, she is unsure if this helps. She had been taking Ultram for the pain, and once stopping this, she noticed an improvement in the seizures as well.   Diagnostic Data: [facility] which is likely from breach artifact from prior craniotomy. There is intermittent right frontal sharp activity but no definite epileptiform activity. Impression: Mild right hemispheric focal cortical irritability but no definitive epileptiform activity is noted.  Her 48-hour EEG did not show any epileptiform discharges. It showed breach artifact consistent with prior surgery. She reported numerous symptoms including headache, severe muscle spasm under the base of her skull on the right side, tremors in both hands, jerking and pause in jaw  (right side), left leg/inner thigh and left foot warm and pigmented, eyes twitching, fatigue, with no associated epileptiform correlate seen. I personally reviewed MRI brain with and without contrast done 03/13/14 which showed post-op right-sided craniotomy. The surgical cavity in the right frontal lobe is filled with fluid, with note of improvement in layering blood products since prior study. The study of 01/24/2014 showed restricted diffusion deep to the tumor. This is consistent with acute infarct which is resolving at this time. No acute infarct is present on today's study. No evidence of recurrent or residual tumor although the original tumor did not enhance. No mass effect or edema is present that would suggest recurrent tumor. Small amount of subarachnoid hemorrhage on the right again noted. MRI 08/2014 which I personally reviewed, there is similar pattern of enlargement and abnormal FLAIR signal in the right frontal gyrus immediately superior to the resection, stable from previous scan.   Epilepsy Risk Factors: Right frontal oligodendroglioma s/p subtotal resection. Otherwise she had a normal birth and early development. There is no history of febrile convulsions, CNS infections such as meningitis/encephalitis, or family history of seizures. PAST MEDICAL HISTORY: Past Medical History  Diagnosis Date  . Liver hemangioma   . Anemia   . Headache(784.0)  migraines  . Anxiety   . Asthma     very mild- no inhaler use since July 2013  . Complication of anesthesia     takes longer for patient to go under anesthesia  . Brain tumor     Hx: of  . Infection     to right head incision  . Petit mal seizure status   . Dizziness   . Seizures     petit mal serizures most days  . Cancer 2003    skin  . Herniated disc   . Nausea alone 02/12/2014  . Dysphasia 02/12/2014  . Headache 02/12/2014  . Neuropathic pain 04/08/2014  . Liver lesion 08/22/2014  . 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 Chronic  fatigue 10/03/2014  . Hot flashes 10/03/2014    MEDICATIONS: Current Outpatient Prescriptions on File Prior to Visit  Medication Sig Dispense Refill  . albuterol (PROVENTIL HFA;VENTOLIN HFA) 108 (90 BASE) MCG/ACT inhaler Inhale 2 puffs into the lungs every 6 (six) hours as needed for wheezing or shortness of breath.     . ALPRAZolam (XANAX) 1 MG tablet Take 1 mg by mouth 2 (two) times daily as needed (for seizures).     . butalbital-acetaminophen-caffeine (FIORICET, ESGIC) 50-325-40 MG per tablet TAKE 1 TABLET BY MOUTH EVERY 6 HOURS AS NEEDED FOR HEADACHE 90 tablet 0  . carisoprodol (SOMA) 350 MG tablet Take 350 mg by mouth 4 (four) times daily. Takes with Keppra for seizure prevention.    . cetirizine (ZYRTEC) 10 MG tablet Take 10 mg by mouth daily.    . divalproex (DEPAKOTE ER) 250 MG 24 hr tablet TAKE 2 TABLETS EVERY AM AND 3 TABLETS EVERY PM 150 tablet 2  . gabapentin (NEURONTIN) 300 MG capsule Take 3 capsules in the morning and 3 capsules in the evening 180 capsule 2  . morphine (MSIR) 15 MG tablet Take 1 tablet (15 mg total) by mouth every 6 (six) hours as needed for severe pain. 90 tablet 0   No current facility-administered medications on file prior to visit.    ALLERGIES: Allergies  Allergen Reactions  . Adhesive [Tape] Other (See Comments)    Skin tear  . Latex Hives  . Percocet [Oxycodone-Acetaminophen] Other (See Comments)    makes pt angry  . Shellfish Allergy Diarrhea and Nausea And Vomiting  . Soma [Carisoprodol]     Watson brand, specifically, causes disorientation (can take other brands)  . Vancomycin Hives    FAMILY HISTORY: Family History  Problem Relation Age of Onset  . Hypertension Mother   . Skin cancer Mother   . Thyroid cancer Father   . Skin cancer Father   . Breast cancer Maternal Aunt     dx in her 87s  . Prostate cancer Maternal Uncle 63  . Melanoma Maternal Uncle   . Ovarian cancer Maternal Grandmother     dx in her late 78s-40s  . Breast  cancer Maternal Aunt     bilateral breast cancer dx in 42s and 57s; possibly BRCA+  . Other Son     multiple CAL spots, bright spots on brain MRI, ? chiari malformation    SOCIAL HISTORY: History   Social History  . Marital Status: Single    Spouse Name: N/A  . Number of Children: 2  . Years of Education: College   Occupational History  . laser tech    Social History Main Topics  . Smoking status: Never Smoker   . Smokeless tobacco: Never Used  . Alcohol  Use: No  . Drug Use: No  . Sexual Activity: Yes    Birth Control/ Protection: None   Other Topics Concern  . Not on file   Social History Narrative   Patient lives at home with her two children.   Caffeine use: minimum of 4 cups daily    REVIEW OF SYSTEMS: Constitutional: No fevers, chills, or sweats, no generalized fatigue, change in appetite Eyes: No visual changes, double vision, eye pain Ear, nose and throat: No hearing loss, ear pain, nasal congestion, sore throat Cardiovascular: No chest pain, palpitations Respiratory:  No shortness of breath at rest or with exertion, wheezes GastrointestinaI: No nausea, vomiting, diarrhea, abdominal pain, fecal incontinence Genitourinary:  No dysuria, urinary retention or frequency Musculoskeletal:  No neck pain, back pain Integumentary: No rash, pruritus, skin lesions Neurological: as above Psychiatric: + depression, insomnia, anxiety Endocrine: No palpitations, fatigue, diaphoresis, mood swings, change in appetite, change in weight, increased thirst Hematologic/Lymphatic:  No anemia, purpura, petechiae. Allergic/Immunologic: no itchy/runny eyes, nasal congestion, recent allergic reactions, rashes  PHYSICAL EXAM: Filed Vitals:   01/21/15 1120  BP: 132/80  Pulse: 74   General: No acute distress Head:  Normocephalic/atraumatic Neck: supple, no paraspinal tenderness, full range of motion Heart:  Regular rate and rhythm Lungs:  Clear to auscultation bilaterally Back:  No paraspinal tenderness Skin/Extremities: No rash, no edema Neurological Exam: alert and oriented to person, place, and time. No aphasia or dysarthria. Fund of knowledge is appropriate.  Recent and remote memory are intact. 3/3 delayed recall.  Attention and concentration are normal.    Able to name objects and repeat phrases. Cranial nerves: Pupils equal, round, reactive to light.  Fundoscopic exam unremarkable, no papilledema. Extraocular movements intact with no nystagmus. Visual fields full. Facial sensation intact. No facial asymmetry. Tongue, uvula, palate midline.  Motor: Bulk and tone normal, muscle strength 5/5 throughout with no pronator drift.  Sensation to light touch intact.  No extinction to double simultaneous stimulation.  Deep tendon reflexes 2+ throughout, toes downgoing.  Finger to nose testing intact.  Gait narrow-based and steady, able to tandem walk adequately.  Romberg negative.  IMPRESSION: This is a 34 yo RH woman with a history of grade 2 oligodengroglioma s/p subtotal resection on chemotherapy, anxiety, headaches, with recurrent episodes of low amplitude tremors, palpitations, and right facial tightening, as well as left face and arm numbness. She had a 48-hour EEG which did not show any epileptiform discharges. She recently was evaluated by [facility], and 3-day video EEG monitoring interictally showed breach artifact, otherwise no epileptiform discharges. She did not have typical events and hence these could not be commented on. On hospital discharge, she was advised to return to the [place] epilepsy clinic for further medication adjustments, she has a follow-up there in 3 weeks. I discussed with her that at this juncture, I would recommend she continue epilepsy care at [facility] while they may adjustments to her medications. Once no further medication adjustments will be done, I would be happy to continue local care. She will continue on current doses of Depakote and Gabapentin until  follow-up with Dr. Virgina Evener. She will follow-up on a prn basis and knows to call our office for any concerns.   Thank you for allowing me to participate in her care.  Please do not hesitate to call for any questions or concerns.  The duration of this appointment visit was 15 minutes of face-to-face time with the patient.  Greater than 50% of this time was spent  in counseling, explanation of diagnosis, planning of further management, and coordination of care.   Ellouise Newer, M.D.   CC: Dr. Laurann Montana

## 2015-01-28 ENCOUNTER — Other Ambulatory Visit: Payer: Self-pay | Admitting: Hematology and Oncology

## 2015-01-28 ENCOUNTER — Telehealth: Payer: Self-pay | Admitting: *Deleted

## 2015-01-28 DIAGNOSIS — C711 Malignant neoplasm of frontal lobe: Secondary | ICD-10-CM

## 2015-01-28 MED ORDER — MORPHINE SULFATE ER 30 MG PO TBCR: 30.0000 mg | EXTENDED_RELEASE_TABLET | Freq: Two times a day (BID) | ORAL | Status: DC

## 2015-01-28 NOTE — Telephone Encounter (Signed)
Ready for pick up

## 2015-01-28 NOTE — Telephone Encounter (Signed)
TC to patient and left VM for patient on identified phone to let her know her prescription is ready for pick up.

## 2015-01-28 NOTE — Telephone Encounter (Signed)
VM message received from patient @ 11:41 am She is requesting refill on MS Contin 30 mg. However, this is not listed on her medication list. She has MSIR 15 mg 1 every 6 hours as needed and this was filled on 01/15/15.  Pt stated "I don't use them very often, only when my headache lasts a long time. I have not had them filled since May" . Pt did not say her pain had changed at all.

## 2015-02-12 ENCOUNTER — Other Ambulatory Visit: Payer: Self-pay | Admitting: Hematology and Oncology

## 2015-02-12 ENCOUNTER — Telehealth: Payer: Self-pay | Admitting: *Deleted

## 2015-02-12 MED ORDER — MORPHINE SULFATE 15 MG PO TABS: 15.0000 mg | ORAL_TABLET | Freq: Four times a day (QID) | ORAL | Status: DC | PRN

## 2015-02-12 NOTE — Telephone Encounter (Signed)
Pt left VM states needs refill on MS IR 15 mg.    She also reports right arm numb down to hand w/ difficulty gripping for past 6 to 7 days..  She went off her cortisone 2 weeks ago in preparation for MRI at [facility].  She reported the arm numbness to Neurologist yesterday at [facility] and she says was told it was not something the neurologist would assess.  Pt asks if she is supposed to see Dr. Christella Noa,  Dr. Tommi Rumps or Dr. Alvy Bimler for this problem?

## 2015-02-12 NOTE — Telephone Encounter (Signed)
Script ready for pickup I am not sure what the numbness is about MRI would be the key When is it scheduled? I can see her on Monday if MRI is not for another 2 weeks. If MRI is next week she can just wait until MRI is done

## 2015-02-12 NOTE — Telephone Encounter (Signed)
LVM for pt informing her of appt time on Monday, to arrive at 11:45 am for 12 noon appt w/ Dr. Alvy Bimler.

## 2015-02-12 NOTE — Telephone Encounter (Signed)
i added her at 12pm, check in 1145

## 2015-02-12 NOTE — Telephone Encounter (Signed)
Pt states MRI is not until 8/18.  She would like to see Dr. Alvy Bimler on Monday.   Informed pt I will call her back w/ a time.   Also informed her of Rx for MS ready to pick up.

## 2015-02-13 ENCOUNTER — Other Ambulatory Visit: Payer: Self-pay | Admitting: Hematology and Oncology

## 2015-02-16 ENCOUNTER — Encounter: Payer: Self-pay | Admitting: Hematology and Oncology

## 2015-02-16 ENCOUNTER — Telehealth: Payer: Self-pay | Admitting: Hematology and Oncology

## 2015-02-16 ENCOUNTER — Ambulatory Visit (HOSPITAL_BASED_OUTPATIENT_CLINIC_OR_DEPARTMENT_OTHER): Payer: Medicaid Other

## 2015-02-16 ENCOUNTER — Ambulatory Visit (HOSPITAL_BASED_OUTPATIENT_CLINIC_OR_DEPARTMENT_OTHER): Payer: Medicaid Other | Admitting: Hematology and Oncology

## 2015-02-16 VITALS — BP 131/85 | HR 79 | Temp 98.8°F | Resp 20 | Ht 69.0 in | Wt 181.0 lb

## 2015-02-16 DIAGNOSIS — C711 Malignant neoplasm of frontal lobe: Secondary | ICD-10-CM

## 2015-02-16 DIAGNOSIS — G44021 Chronic cluster headache, intractable: Secondary | ICD-10-CM

## 2015-02-16 DIAGNOSIS — M792 Neuralgia and neuritis, unspecified: Secondary | ICD-10-CM | POA: Diagnosis not present

## 2015-02-16 DIAGNOSIS — C719 Malignant neoplasm of brain, unspecified: Secondary | ICD-10-CM

## 2015-02-16 LAB — COMPREHENSIVE METABOLIC PANEL (CC13)
ALT: 21 U/L (ref 0–55)
AST: 19 U/L (ref 5–34)
Albumin: 3.8 g/dL (ref 3.5–5.0)
Alkaline Phosphatase: 44 U/L (ref 40–150)
Anion Gap: 3 mEq/L (ref 3–11)
BUN: 9.2 mg/dL (ref 7.0–26.0)
CO2: 28 mEq/L (ref 22–29)
Calcium: 9.1 mg/dL (ref 8.4–10.4)
Chloride: 108 mEq/L (ref 98–109)
Creatinine: 0.6 mg/dL (ref 0.6–1.1)
EGFR: >90 mL/min/{1.73_m2} (ref >90)
Glucose: 95 mg/dl (ref 70–140)
Potassium: 4.5 mEq/L (ref 3.5–5.1)
Sodium: 139 mEq/L (ref 136–145)
Total Bilirubin: 0.4 mg/dL (ref 0.20–1.20)
Total Protein: 6.7 g/dL (ref 6.4–8.3)

## 2015-02-16 LAB — CBC WITH DIFFERENTIAL/PLATELET
BASO%: 1 % (ref 0.0–2.0)
Basophils Absolute: 0 10*3/uL (ref 0.0–0.1)
EOS%: 0.8 % (ref 0.0–7.0)
Eosinophils Absolute: 0 10*3/uL (ref 0.0–0.5)
HCT: 39.6 % (ref 34.8–46.6)
HGB: 13.4 g/dL (ref 11.6–15.9)
LYMPH%: 34 % (ref 14.0–49.7)
MCH: 31.6 pg (ref 25.1–34.0)
MCHC: 33.9 g/dL (ref 31.5–36.0)
MCV: 93.2 fL (ref 79.5–101.0)
MONO#: 0.4 10*3/uL (ref 0.1–0.9)
MONO%: 8.1 % (ref 0.0–14.0)
NEUT#: 2.7 10*3/uL (ref 1.5–6.5)
NEUT%: 56.1 % (ref 38.4–76.8)
Platelets: 217 10*3/uL (ref 145–400)
RBC: 4.24 10*6/uL (ref 3.70–5.45)
RDW: 12 % (ref 11.2–14.5)
WBC: 4.9 10*3/uL (ref 3.9–10.3)
lymph#: 1.7 10*3/uL (ref 0.9–3.3)

## 2015-02-16 MED ORDER — BUTALBITAL-APAP-CAFFEINE 50-325-40 MG PO TABS: ORAL_TABLET | ORAL | Status: DC

## 2015-02-16 NOTE — Telephone Encounter (Signed)
Pt confirmed labs/ov per 08/01 per POF, gave pt avs and calendar... KJ

## 2015-02-16 NOTE — Assessment & Plan Note (Signed)
She has discontinued steroids over a month ago and now with multiple symptoms. MRI is scheduled at [facility] for further evaluation. I am concerned with a severe headache whether she may have intracranial bleed or new swelling. I recommend CT scan of the head to evaluate further. I will order this is urgent and plan to see her back in 3 days for further assessment

## 2015-02-16 NOTE — Progress Notes (Signed)
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   09/27/2013 Pathology Results Accession: [id number] pathology showed grade 2 oligodendroglioma with mutant IDH1 protein, ATRX, OLIG 2 expression. p53 is negative, low Ki-67 labeling index. 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   11/06/2013 Imaging Repeat CT scan of the head showed resolution of abscess.   01/23/2014 Surgery Dr. Christella Noa perform a subtotal gross resection of the tumor.   01/24/2014 Imaging Repeat imaging studies showed persistent and residual disease.   03/03/2014 -  Chemotherapy She was started on Temodar, 5 days every 28 days   03/13/2014 Imaging Repeat MRI showed no evidence of progression of disease.   07/14/2014 Imaging Repeat MRI showed no evidence of disease progression.   09/12/2014 Imaging MRI of the liver show mild regression of the size of lesions, presumed to be benign   09/15/2014 Imaging Repeat MRI of the brain show persistent abnormalities, suspicious for residual disease    INTERVAL HISTORY: Please see below for problem oriented charting. She is seen urgently today for multiple complaints. The patient discontinued corticosteroids treatment on 01/21/2015 anticipation for MRI at [facility] in 2 weeks. Since then, she had worsening headaches, right arm weakness, intermittent tingling numbness of her right hand and other nonspecific neurological symptoms. In terms of a headache, her headache is severe with worsening pain over the right eyes/frontal area with associated severe pressure over the right eye. She has  intermittent blurriness of vision over that region. Her headaches is so bad that she vomited yesterday. In terms of the weakness of her right arm, it is associated with a burning sensation at the tip of the right shoulder area radiating down to her hand, worse in the right middle finger/C7 distribution. She has new Weakness with Difficulties with Squeezing and Gripping of her right hand over the Last Few Days. Her Husband Noted She Has Mild Shaking/Twitching of Her Lips. She Denies Dysphagia. Her Right Hand Is Also Quite Swollen over the Weekend and She Showed Me Some Pictures Which Indeed Confirmed Some Diffuse Swelling of Her Right Upper Extremity. She Denies Loss of Bowel or Bladder Control and No Other Weakness in the Contralateral Part of Her Body or Lower Extremities.  REVIEW OF SYSTEMS:   Constitutional: Denies fevers, chills or abnormal weight loss Eyes: Denies blurriness of vision Ears, nose, mouth, throat, and face: Denies mucositis or sore throat Respiratory: Denies cough, dyspnea or wheezes Cardiovascular: Denies palpitation, chest discomfort or lower extremity swelling Gastrointestinal:  Denies nausea, heartburn or change in bowel habits Skin: Denies abnormal skin rashes Behavioral/Psych: Mood is stable, no new changes  All other systems were reviewed with the patient and are negative.  I have reviewed the past medical history, past surgical history, social history and family history with the patient and they are unchanged from previous note.  ALLERGIES:  is allergic to adhesive; latex; percocet; shellfish allergy; soma; and vancomycin.  MEDICATIONS:  Current Outpatient Prescriptions  Medication Sig Dispense Refill  . albuterol (PROVENTIL HFA;VENTOLIN HFA) 108 (90 BASE) MCG/ACT inhaler Inhale 2 puffs into the lungs every 6 (six) hours as needed for wheezing or shortness of breath.     Marland Kitchen  ALPRAZolam (XANAX) 1 MG tablet Take 1 mg by mouth 2 (two) times daily as needed (for  seizures).     . butalbital-acetaminophen-caffeine (FIORICET, ESGIC) 50-325-40 MG per tablet TAKE 1 TABLET BY MOUTH EVERY 6 HOURS AS NEEDED FOR HEADACHE 90 tablet 0  . carisoprodol (SOMA) 350 MG tablet Take 350 mg by mouth 4 (four) times daily. Takes with Keppra for seizure prevention.    . cetirizine (ZYRTEC) 10 MG tablet Take 10 mg by mouth daily.    . divalproex (DEPAKOTE ER) 250 MG 24 hr tablet TAKE 2 TABLETS EVERY AM AND 3 TABLETS EVERY PM 150 tablet 2  . gabapentin (NEURONTIN) 300 MG capsule Take 3 capsules in the morning and 3 capsules in the evening 180 capsule 2  . morphine (MS CONTIN) 30 MG 12 hr tablet Take 1 tablet (30 mg total) by mouth every 12 (twelve) hours. 30 tablet 0  . morphine (MSIR) 15 MG tablet Take 1 tablet (15 mg total) by mouth every 6 (six) hours as needed for severe pain. 90 tablet 0   No current facility-administered medications for this visit.    PHYSICAL EXAMINATION: ECOG PERFORMANCE STATUS: 1 - Symptomatic but completely ambulatory  Filed Vitals:   02/16/15 1219  BP: 131/85  Pulse: 79  Temp: 98.8 F (37.1 C)  Resp: 20   Filed Weights   02/16/15 1212 02/16/15 1219  Weight: 181 lb (82.101 kg) 181 lb (82.101 kg)    GENERAL:alert, no distress and comfortable SKIN: skin color, texture, turgor are normal, no rashes or significant lesions EYES: normal, Conjunctiva are pink and non-injected, sclera clear OROPHARYNX:no exudate, no erythema and lips, buccal mucosa, and tongue normal  NECK: supple, thyroid normal size, non-tender, without nodularity LYMPH:  no palpable lymphadenopathy in the cervical, axillary or inguinal LUNGS: clear to auscultation and percussion with normal breathing effort HEART: regular rate & rhythm and no murmurs and with mild right hand edema ABDOMEN:abdomen soft, non-tender and normal bowel sounds Musculoskeletal:no cyanosis of digits and no clubbing  NEURO: alert & oriented x 3 with fluent speech, noted mild weakness of her right  hand with grip.  LABORATORY DATA:  I have reviewed the data as listed    Component Value Date/Time   NA 142 01/20/2015 1202   NA 138 01/27/2014 0611   K 4.1 01/20/2015 1202   K 4.4 01/27/2014 0611   CL 99 01/27/2014 0611   CO2 26 01/20/2015 1202   CO2 26 01/27/2014 0611   GLUCOSE 87 01/20/2015 1202   GLUCOSE 115* 01/27/2014 0611   BUN 10.0 01/20/2015 1202   BUN 8 01/27/2014 0611   CREATININE 0.7 01/20/2015 1202   CREATININE 0.48* 01/27/2014 0611   CALCIUM 9.4 01/20/2015 1202   CALCIUM 9.0 01/27/2014 0611   PROT 6.9 01/20/2015 1202   PROT 6.9 09/20/2013 0455   ALBUMIN 4.0 01/20/2015 1202   ALBUMIN 3.9 09/20/2013 0455   AST 14 01/20/2015 1202   AST 12 09/20/2013 0455   ALT 14 01/20/2015 1202   ALT 8 09/20/2013 0455   ALKPHOS 51 01/20/2015 1202   ALKPHOS 40 09/20/2013 0455   BILITOT 0.31 01/20/2015 1202   BILITOT 0.7 09/20/2013 0455   GFRNONAA >90 01/27/2014 0611   GFRAA >90 01/27/2014 0611    No results found for: SPEP, UPEP  Lab Results  Component Value Date   WBC 5.5 01/20/2015   NEUTROABS 4.4 01/20/2015   HGB 13.7 01/20/2015   HCT 41.1 01/20/2015   MCV 94.6 01/20/2015   PLT  221 01/20/2015      Chemistry      Component Value Date/Time   NA 142 01/20/2015 1202   NA 138 01/27/2014 0611   K 4.1 01/20/2015 1202   K 4.4 01/27/2014 0611   CL 99 01/27/2014 0611   CO2 26 01/20/2015 1202   CO2 26 01/27/2014 0611   BUN 10.0 01/20/2015 1202   BUN 8 01/27/2014 0611   CREATININE 0.7 01/20/2015 1202   CREATININE 0.48* 01/27/2014 0611      Component Value Date/Time   CALCIUM 9.4 01/20/2015 1202   CALCIUM 9.0 01/27/2014 0611   ALKPHOS 51 01/20/2015 1202   ALKPHOS 40 09/20/2013 0455   AST 14 01/20/2015 1202   AST 12 09/20/2013 0455   ALT 14 01/20/2015 1202   ALT 8 09/20/2013 0455   BILITOT 0.31 01/20/2015 1202   BILITOT 0.7 09/20/2013 0455     ASSESSMENT & PLAN:  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 MRI is scheduled at [facility] for further evaluation. I am concerned with a severe headache whether she may have intracranial bleed or new swelling. I recommend CT scan of the head to evaluate further. I will order this is urgent and plan to see her back in 3 days for further assessment  Intractable chronic cluster headache Again, I am not sure what is the cause of this. I will order urgent CT scan for evaluation In the meantime, I refilled her prescription Fioricet and she will continue to take morphine sulfate as needed for pain  Neuropathic pain She has new onset or neuropathic pain along the C7 distribution on the right side which is exacerbated with certain head movement/position of the neck. The patient has seizure disorder and I wonder whether she may have caused any from of fracture or nerve impingement of that location. I will order MRI of the cervical spine for further evaluation In the meantime, she'll continue to take pain medicine as needed for pain   Orders Placed This Encounter  Procedures  . MR Cervical Spine W Contrast    Standing Status: Future     Number of Occurrences:      Standing Expiration Date: 02/16/2016    Order Specific Question:  Reason for Exam (SYMPTOM  OR DIAGNOSIS REQUIRED)    Answer:  severe C7 right nerve pain on the right, sezire disorder, exclude pinced nerve    Order Specific Question:  Preferred imaging location?    Answer:  [facility]    Order Specific Question:  Does the patient have a pacemaker or implanted devices?    Answer:  No    Order Specific Question:  What is the patient's sedation requirement?    Answer:  Sedation  . CT Head W Wo Contrast    Standing Status: Future     Number of Occurrences:      Standing Expiration Date: 02/16/2016    Order Specific Question:  Reason for Exam (SYMPTOM  OR DIAGNOSIS REQUIRED)    Answer:  severe C7  right nerve pain on the right, sezire disorder, exclude pinced nerve    Order Specific Question:  Is the patient pregnant?    Answer:  No    Order Specific Question:  Preferred imaging location?    Answer:  [facility]  . CBC with Differential/Platelet    Standing Status: Future  Number of Occurrences:      Standing Expiration Date: 03/22/2016  . Comprehensive metabolic panel    Standing Status: Future     Number of Occurrences:      Standing Expiration Date: 03/22/2016   All questions were answered. The patient knows to call the clinic with any problems, questions or concerns. No barriers to learning was detected. I spent 30 minutes counseling the patient face to face. The total time spent in the appointment was 40 minutes and more than 50% was on counseling and review of test results     [facility], [place], MD 02/16/2015 12:39 PM

## 2015-02-16 NOTE — Assessment & Plan Note (Signed)
She has new onset or neuropathic pain along the C7 distribution on the right side which is exacerbated with certain head movement/position of the neck. The patient has seizure disorder and I wonder whether she may have caused any from of fracture or nerve impingement of that location. I will order MRI of the cervical spine for further evaluation In the meantime, she'll continue to take pain medicine as needed for pain

## 2015-02-16 NOTE — Assessment & Plan Note (Signed)
Again, I am not sure what is the cause of this. I will order urgent CT scan for evaluation In the meantime, I refilled her prescription Fioricet and she will continue to take morphine sulfate as needed for pain

## 2015-02-17 ENCOUNTER — Ambulatory Visit (HOSPITAL_COMMUNITY)
Admission: RE | Admit: 2015-02-17 | Discharge: 2015-02-17 | Disposition: A | Discharge status: Home / Self Care | Payer: Medicaid Other | Source: Ambulatory Visit | Attending: Hematology and Oncology | Admitting: Hematology and Oncology

## 2015-02-17 ENCOUNTER — Telehealth: Payer: Self-pay | Admitting: *Deleted

## 2015-02-17 ENCOUNTER — Other Ambulatory Visit: Payer: Self-pay | Admitting: Hematology and Oncology

## 2015-02-17 ENCOUNTER — Encounter (HOSPITAL_COMMUNITY): Payer: Self-pay

## 2015-02-17 DIAGNOSIS — M5412 Radiculopathy, cervical region: Secondary | ICD-10-CM | POA: Insufficient documentation

## 2015-02-17 DIAGNOSIS — C719 Malignant neoplasm of brain, unspecified: Secondary | ICD-10-CM

## 2015-02-17 DIAGNOSIS — G40909 Epilepsy, unspecified, not intractable, without status epilepticus: Secondary | ICD-10-CM | POA: Diagnosis not present

## 2015-02-17 DIAGNOSIS — G950 Syringomyelia and syringobulbia: Secondary | ICD-10-CM | POA: Diagnosis not present

## 2015-02-17 HISTORY — DX: Malignant neoplasm of brain, unspecified: C71.9

## 2015-02-17 MED ORDER — GADOBENATE DIMEGLUMINE 529 MG/ML IV SOLN
20.0000 mL | Freq: Once | INTRAVENOUS | Status: AC | PRN
  Administered 2015-02-17: 17 mL via INTRAVENOUS

## 2015-02-17 MED ORDER — IOHEXOL 300 MG/ML  SOLN
100.0000 mL | Freq: Once | INTRAMUSCULAR | Status: AC | PRN
  Administered 2015-02-17: 100 mL via INTRAVENOUS

## 2015-02-17 NOTE — Telephone Encounter (Signed)
LVM for pt informing if MRI and CT results available and Dr. Alvy Bimler can see her tomorrow at 1 pm or she can keep her appt on Thursday as scheduled.  Please call back to let us know.

## 2015-02-17 NOTE — Telephone Encounter (Signed)
LVM for pt to come tomorrow 8/3 at 1 pm.

## 2015-02-17 NOTE — Telephone Encounter (Signed)
Yes tomorrow

## 2015-02-17 NOTE — Telephone Encounter (Signed)
I will do it.

## 2015-02-17 NOTE — Telephone Encounter (Signed)
Karyn left Vm states she can come tomorrow at 1 pm or Thursday as scheduled. Whichever works best for Dr. Alvy Bimler.

## 2015-02-17 NOTE — Progress Notes (Signed)
Patient's IV was left in per patient's request. She stated Dr. Alvy Bimler may order a MRI of Brain dependent on CT Head results. Please remove IV if patient is sent home. She has gone to the cancer center to await the CT results. Thank you.

## 2015-02-18 ENCOUNTER — Encounter: Payer: Self-pay | Admitting: Hematology and Oncology

## 2015-02-18 ENCOUNTER — Ambulatory Visit (HOSPITAL_BASED_OUTPATIENT_CLINIC_OR_DEPARTMENT_OTHER): Payer: Medicaid Other | Admitting: Hematology and Oncology

## 2015-02-18 VITALS — BP 178/163 | HR 70 | Temp 97.6°F | Resp 18 | Ht 69.0 in | Wt 183.8 lb

## 2015-02-18 DIAGNOSIS — G44021 Chronic cluster headache, intractable: Secondary | ICD-10-CM | POA: Diagnosis not present

## 2015-02-18 DIAGNOSIS — M792 Neuralgia and neuritis, unspecified: Secondary | ICD-10-CM

## 2015-02-18 DIAGNOSIS — C711 Malignant neoplasm of frontal lobe: Secondary | ICD-10-CM

## 2015-02-18 NOTE — Assessment & Plan Note (Signed)
I am not sure what is the cause of this. Urgent CT scan of head excluded mass effect or intracranial bleed In the meantime, I refilled her prescription Fioricet and she will continue to take morphine sulfate as needed for pain

## 2015-02-18 NOTE — Assessment & Plan Note (Signed)
She has new onset or neuropathic pain along the C7 distribution on the right side which is exacerbated with certain head movement/position of the neck. The patient has seizure disorder and I wonder whether she may have caused any from of fracture or nerve impingement of that location. MRI of the cervical spine excluded significant stenosis/disc disease In the meantime, she'll continue to take pain medicine as needed for pain

## 2015-02-18 NOTE — Assessment & Plan Note (Signed)
She has discontinued steroids over a month ago and now with multiple symptoms. MRI is scheduled at [facility] for further evaluation. I will defer to them for further evaluation and discussion about plan of care

## 2015-02-18 NOTE — Progress Notes (Signed)
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   09/27/2013 Pathology Results Accession: [id number] pathology showed grade 2 oligodendroglioma with mutant IDH1 protein, ATRX, OLIG 2 expression. p53 is negative, low Ki-67 labeling index. 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   11/06/2013 Imaging Repeat CT scan of the head showed resolution of abscess.   01/23/2014 Surgery Dr. Christella Noa perform a subtotal gross resection of the tumor.   01/24/2014 Imaging Repeat imaging studies showed persistent and residual disease.   03/03/2014 -  Chemotherapy She was started on Temodar, 5 days every 28 days   03/13/2014 Imaging Repeat MRI showed no evidence of progression of disease.   07/14/2014 Imaging Repeat MRI showed no evidence of disease progression.   09/12/2014 Imaging MRI of the liver show mild regression of the size of lesions, presumed to be benign   09/15/2014 Imaging Repeat MRI of the brain show persistent abnormalities, suspicious for residual disease    INTERVAL HISTORY: Please see below for problem oriented charting. She returns for further follow-up Her headache is stable. She has persistent numbness in the C7 distribution  REVIEW OF SYSTEMS:   Constitutional: Denies fevers, chills or abnormal weight loss Eyes: Denies blurriness of vision Ears, nose, mouth, throat, and face: Denies mucositis or sore throat Respiratory: Denies cough, dyspnea or wheezes Cardiovascular: Denies palpitation, chest discomfort or lower extremity swelling Gastrointestinal:  Denies nausea, heartburn or  change in bowel habits Skin: Denies abnormal skin rashes Lymphatics: Denies new lymphadenopathy or easy bruising Behavioral/Psych: Mood is stable, no new changes  All other systems were reviewed with the patient and are negative.  I have reviewed the past medical history, past surgical history, social history and family history with the patient and they are unchanged from previous note.  ALLERGIES:  is allergic to adhesive; latex; percocet; shellfish allergy; soma; and vancomycin.  MEDICATIONS:  Current Outpatient Prescriptions  Medication Sig Dispense Refill  . albuterol (PROVENTIL HFA;VENTOLIN HFA) 108 (90 BASE) MCG/ACT inhaler Inhale 2 puffs into the lungs every 6 (six) hours as needed for wheezing or shortness of breath.     . ALPRAZolam (XANAX) 1 MG tablet Take 1 mg by mouth 2 (two) times daily as needed (for seizures).     . butalbital-acetaminophen-caffeine (FIORICET, ESGIC) 50-325-40 MG per tablet TAKE 1 TABLET BY MOUTH EVERY 6 HOURS AS NEEDED FOR HEADACHE 90 tablet 0  . carisoprodol (SOMA) 350 MG tablet Take 350 mg by mouth 4 (four) times daily. Takes with Keppra for seizure prevention.    . cetirizine (ZYRTEC) 10 MG tablet Take 10 mg by mouth daily.    . divalproex (DEPAKOTE ER) 250 MG 24 hr tablet TAKE 2 TABLETS EVERY AM AND 3 TABLETS EVERY PM 150 tablet 2  . gabapentin (NEURONTIN) 300 MG capsule Take 3 capsules in the morning and 3 capsules in the evening 180 capsule 2  . morphine (MS CONTIN) 30 MG 12 hr tablet Take 1 tablet (30 mg total) by mouth every 12 (twelve) hours. 30 tablet 0  . morphine (MSIR) 15 MG tablet Take 1 tablet (15 mg total) by mouth every 6 (six) hours as needed for  severe pain. 90 tablet 0   No current facility-administered medications for this visit.    PHYSICAL EXAMINATION: ECOG PERFORMANCE STATUS: 1 - Symptomatic but completely ambulatory  Filed Vitals:   02/18/15 1307  BP: 178/163  Pulse: 70  Temp: 97.6 F (36.4 C)  Resp: 18   Filed Weights    02/18/15 1307  Weight: 183 lb 12.8 oz (83.371 kg)    GENERAL:alert, no distress and comfortable SKIN: skin color, texture, turgor are normal, no rashes or significant lesions EYES: normal, Conjunctiva are pink and non-injected, sclera clear Musculoskeletal:no cyanosis of digits and no clubbing  NEURO: alert & oriented x 3 with fluent speech  LABORATORY DATA:  I have reviewed the data as listed    Component Value Date/Time   NA 139 02/16/2015 1310   NA 138 01/27/2014 0611   K 4.5 02/16/2015 1310   K 4.4 01/27/2014 0611   CL 99 01/27/2014 0611   CO2 28 02/16/2015 1310   CO2 26 01/27/2014 0611   GLUCOSE 95 02/16/2015 1310   GLUCOSE 115* 01/27/2014 0611   BUN 9.2 02/16/2015 1310   BUN 8 01/27/2014 0611   CREATININE 0.6 02/16/2015 1310   CREATININE 0.48* 01/27/2014 0611   CALCIUM 9.1 02/16/2015 1310   CALCIUM 9.0 01/27/2014 0611   PROT 6.7 02/16/2015 1310   PROT 6.9 09/20/2013 0455   ALBUMIN 3.8 02/16/2015 1310   ALBUMIN 3.9 09/20/2013 0455   AST 19 02/16/2015 1310   AST 12 09/20/2013 0455   ALT 21 02/16/2015 1310   ALT 8 09/20/2013 0455   ALKPHOS 44 02/16/2015 1310   ALKPHOS 40 09/20/2013 0455   BILITOT 0.40 02/16/2015 1310   BILITOT 0.7 09/20/2013 0455   GFRNONAA >90 01/27/2014 0611   GFRAA >90 01/27/2014 0611    No results found for: SPEP, UPEP  Lab Results  Component Value Date   WBC 4.9 02/16/2015   NEUTROABS 2.7 02/16/2015   HGB 13.4 02/16/2015   HCT 39.6 02/16/2015   MCV 93.2 02/16/2015   PLT 217 02/16/2015      Chemistry      Component Value Date/Time   NA 139 02/16/2015 1310   NA 138 01/27/2014 0611   K 4.5 02/16/2015 1310   K 4.4 01/27/2014 0611   CL 99 01/27/2014 0611   CO2 28 02/16/2015 1310   CO2 26 01/27/2014 0611   BUN 9.2 02/16/2015 1310   BUN 8 01/27/2014 0611   CREATININE 0.6 02/16/2015 1310   CREATININE 0.48* 01/27/2014 0611      Component Value Date/Time   CALCIUM 9.1 02/16/2015 1310   CALCIUM 9.0 01/27/2014 0611   ALKPHOS 44  02/16/2015 1310   ALKPHOS 40 09/20/2013 0455   AST 19 02/16/2015 1310   AST 12 09/20/2013 0455   ALT 21 02/16/2015 1310   ALT 8 09/20/2013 0455   BILITOT 0.40 02/16/2015 1310   BILITOT 0.7 09/20/2013 0455       RADIOGRAPHIC STUDIES: I have personally reviewed the radiological images as listed and agreed with the findings in the report. Ct Head W Wo Contrast  02/17/2015   CLINICAL DATA:  Astrocytoma. Severe C7 right nerve pain. Seizure disorder.  EXAM: CT HEAD WITHOUT AND WITH CONTRAST  TECHNIQUE: Contiguous axial images were obtained from the base of the skull through the vertex without and with intravenous contrast  CONTRAST:  13mL OMNIPAQUE IOHEXOL 300 MG/ML  SOLN  COMPARISON:  Brain MRI 10/30/2014  FINDINGS: Skull and Sinuses:Unremarkable appearance of remote right craniotomy.  Orbits: No acute abnormality.  Brain: Cortical and subcortical gliosis and encephalomalacia in the posterior right frontal lobe, deep to the craniotomy, correlates with resection site in this patient with history of oligodendroglioma. Thickened and T2 hyperintense gyrus above the resection site is not discernible by CT. No abnormal intracranial enhancement. No evidence of acute infarct, hemorrhage, hydrocephalus, or major vessel occlusion.  IMPRESSION: 1. No acute finding. 2. Stable appearance of right frontal glioma resection site.   Electronically Signed   By: Monte Fantasia M.D.   On: 02/17/2015 13:22   Mr Cervical Spine W Wo Contrast  02/17/2015   CLINICAL DATA:  RIGHT C7 radiculopathy. Seizure disorder. Evaluate for C7 nerve compression. Symptoms for 2 weeks.  EXAM: MRI CERVICAL SPINE WITHOUT AND WITH CONTRAST  TECHNIQUE: Multiplanar and multiecho pulse sequences of the cervical spine, to include the craniocervical junction and cervicothoracic junction, were obtained according to standard protocol without and with intravenous contrast.  CONTRAST:  92mL MULTIHANCE GADOBENATE DIMEGLUMINE 529 MG/ML IV SOLN  COMPARISON:   03/19/2013.  FINDINGS: Exam is mildly degraded by motion. Patient experienced seizure like activity during the exam.  Alignment: Reversal of the normal cervical lordosis centered around C4-C5.  Vertebrae: Marrow signal within normal limits. No destructive osseous lesions.  Cord: No cord edema. Cervical cord syrinx is present that extends from the inferior endplate of C5 through the superior endplate of T1. Largest diameter of syrinx is 2 mm x 2 mm dorsal to the C7 vertebra.  Posterior Fossa: Within normal limits.  Vertebral Arteries: Flow voids present bilaterally. No a cervical spine compression fracture in this patient with seizure disorder.  Paraspinal tissues: Normal.  Disc Signal: Within normal limits for age.  Disc levels:  C2-C3:  Negative.  C3-C4:  Mild LEFT eccentric disc bulging.  No stenosis.  C4-C5:  Shallow disc bulging without stenosis.  C5-C6:  Shallow central disc bulging without stenosis.  C6-C7:  Negative.  C7-T1:  Negative.  There is no pathologic enhancement after gadolinium administration.  IMPRESSION: 1. No stenosis to account for RIGHT C7 radiculopathy. 2. Chronic reversal of the normal cervical lordosis and shallow disc bulging without stenosis. 3. 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 4. Exam mildly degraded by motion.   Electronically Signed   By: Dereck Ligas M.D.   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 MRI is scheduled at [facility] for further evaluation. I will defer to them for further evaluation and discussion about plan of care   Intractable chronic cluster headache I am not sure what is the cause of this. Urgent CT scan of head excluded mass effect or intracranial bleed In the meantime, I refilled her prescription Fioricet and she will continue to take  morphine sulfate as needed for pain  Neuropathic pain She has new onset or neuropathic pain along the C7 distribution on the right side which is exacerbated with certain head movement/position of the neck. The patient has seizure disorder and I wonder whether she may have caused any from of fracture or nerve impingement of that location. MRI of the cervical spine excluded significant stenosis/disc disease In the meantime, she'll continue to take pain medicine as needed for pain     No orders of the defined types were placed in this encounter.   All questions were  answered. The patient knows to call the clinic with any problems, questions or concerns. No barriers to learning was detected. I spent 15 minutes counseling the patient face to face. The total time spent in the appointment was 20 minutes and more than 50% was on counseling and review of test results     [facility], [place], MD 02/18/2015 7:58 PM

## 2015-02-19 ENCOUNTER — Telehealth: Payer: Self-pay | Admitting: *Deleted

## 2015-02-19 ENCOUNTER — Ambulatory Visit: Payer: Medicaid Other | Admitting: Hematology and Oncology

## 2015-02-19 NOTE — Telephone Encounter (Signed)
Patient states she will run out of Soma after Friday's dose. Pt called Dr Lacy Duverney office Wednesday for refill, called back today and was told if they don't get to it today, it will be next week as Dr Christella Noa will be in surgery on Friday. [place] RN called Dr Lacy Duverney office to see if they will be able to get this taken care of today. Was told they will check on it and call us back. Instructed patient to call us Friday AM if she has not gotten refill from Dr Christella Noa.

## 2015-02-20 ENCOUNTER — Telehealth: Payer: Self-pay | Admitting: *Deleted

## 2015-02-20 NOTE — Telephone Encounter (Signed)
PLs go ahead and fill it  90 tabs max with 3 refills if possible

## 2015-02-20 NOTE — Telephone Encounter (Signed)
Pt states Dr. Christella Noa did refill the [facility], but she had a real difficult time getting the refill.  She says this is the only Rx she gets from Dr. Christella Noa and asks if Dr. Alvy Bimler would be willing to refill in the future, since she prescribes some of her other meds?    Instructed pt to call us when she has about one week left to request refill.  She verbalized understanding.

## 2015-02-26 ENCOUNTER — Telehealth: Payer: Self-pay | Admitting: *Deleted

## 2015-02-26 MED ORDER — MORPHINE SULFATE ER 30 MG PO TBCR: 30.0000 mg | EXTENDED_RELEASE_TABLET | Freq: Two times a day (BID) | ORAL | Status: DC

## 2015-02-26 NOTE — Telephone Encounter (Signed)
Pt left VM needs refill on MS Contin 30 mg.  Her husband, [name], will pick up Rx.   Left VM for husband informing of Rx ready to pick up at our office.

## 2015-03-04 ENCOUNTER — Other Ambulatory Visit: Payer: Self-pay | Admitting: Hematology and Oncology

## 2015-03-04 ENCOUNTER — Telehealth: Payer: Self-pay

## 2015-03-04 ENCOUNTER — Telehealth: Payer: Self-pay | Admitting: *Deleted

## 2015-03-04 MED ORDER — BUTALBITAL-APAP-CAFFEINE 50-325-40 MG PO TABS: ORAL_TABLET | ORAL | Status: DC

## 2015-03-04 NOTE — Telephone Encounter (Signed)
Pt will come to see Dr Alvy Bimler on Tuesday, 8/23 @ 1400. Needs Fioricet refill now.

## 2015-03-04 NOTE — Telephone Encounter (Signed)
VM from pt, forwarded to Dr. Filiberto Pinks nurse

## 2015-03-05 ENCOUNTER — Telehealth: Payer: Self-pay | Admitting: Hematology and Oncology

## 2015-03-05 NOTE — Telephone Encounter (Signed)
Appointments made and per pof patient is aware  Victoria Bates

## 2015-03-10 ENCOUNTER — Other Ambulatory Visit: Payer: Self-pay | Admitting: Hematology and Oncology

## 2015-03-10 ENCOUNTER — Encounter: Payer: Self-pay | Admitting: Hematology and Oncology

## 2015-03-10 ENCOUNTER — Ambulatory Visit (HOSPITAL_BASED_OUTPATIENT_CLINIC_OR_DEPARTMENT_OTHER): Payer: Medicaid Other | Admitting: Hematology and Oncology

## 2015-03-10 VITALS — BP 183/136 | HR 86 | Temp 98.0°F | Resp 18 | Ht 69.0 in | Wt 179.0 lb

## 2015-03-10 DIAGNOSIS — C711 Malignant neoplasm of frontal lobe: Secondary | ICD-10-CM | POA: Diagnosis present

## 2015-03-10 DIAGNOSIS — R202 Paresthesia of skin: Secondary | ICD-10-CM

## 2015-03-10 DIAGNOSIS — R519 Headache, unspecified: Secondary | ICD-10-CM

## 2015-03-10 DIAGNOSIS — G40109 Localization-related (focal) (partial) symptomatic epilepsy and epileptic syndromes with simple partial seizures, not intractable, without status epilepticus: Secondary | ICD-10-CM

## 2015-03-10 DIAGNOSIS — M792 Neuralgia and neuritis, unspecified: Secondary | ICD-10-CM | POA: Diagnosis not present

## 2015-03-10 DIAGNOSIS — R110 Nausea: Secondary | ICD-10-CM

## 2015-03-10 DIAGNOSIS — G44021 Chronic cluster headache, intractable: Secondary | ICD-10-CM | POA: Diagnosis not present

## 2015-03-10 DIAGNOSIS — R4702 Dysphasia: Secondary | ICD-10-CM

## 2015-03-10 DIAGNOSIS — R0789 Other chest pain: Secondary | ICD-10-CM

## 2015-03-10 DIAGNOSIS — R51 Headache: Secondary | ICD-10-CM

## 2015-03-10 MED ORDER — MORPHINE SULFATE 15 MG PO TABS: 15.0000 mg | ORAL_TABLET | Freq: Four times a day (QID) | ORAL | Status: DC | PRN

## 2015-03-10 MED ORDER — CARISOPRODOL 350 MG PO TABS: 350.0000 mg | ORAL_TABLET | Freq: Four times a day (QID) | ORAL | Status: DC

## 2015-03-10 NOTE — Assessment & Plan Note (Signed)
Her headaches appear to be well controlled with a combination of pain medication and Fioricet. I have refilled all her prescriptions today

## 2015-03-10 NOTE — Progress Notes (Signed)
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   09/27/2013 Pathology Results Accession: [id number] pathology showed grade 2 oligodendroglioma with mutant IDH1 protein, ATRX, OLIG 2 expression. p53 is negative, low Ki-67 labeling index. 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   11/06/2013 Imaging Repeat CT scan of the head showed resolution of abscess.   01/23/2014 Surgery Dr. Christella Noa perform a subtotal gross resection of the tumor.   01/24/2014 Imaging Repeat imaging studies showed persistent and residual disease.   03/03/2014 - 09/01/2014 Chemotherapy She was started on Temodar, 5 days every 28 days   03/13/2014 Imaging Repeat MRI showed no evidence of progression of disease.   07/14/2014 Imaging Repeat MRI showed no evidence of disease progression.   09/12/2014 Imaging MRI of the liver show mild regression of the size of lesions, presumed to be benign   09/15/2014 Imaging Repeat MRI of the brain show persistent abnormalities, suspicious for residual disease   03/02/2015 Imaging  MRI brain at [facility] show persistent abnormalities    INTERVAL HISTORY: Please see below for problem oriented charting. She returns today for further discussion about recent recommendation from [place]. I have reviewed 7 pages of outside records. I also reviewed the imaging study myself. The patient wanted several questions addressed: #1 persistent abnormality seen on brain MRI:  She is concerned that she have residual disease and is not comfortable with watchful observation approach #2 memory loss & fatigue: Her  memory loss is significant. She has fatigue.  There have been recommendation from one of the physician to try Adderall or Ritalin #3 seizures: The patient had persistent seizures and she is concerned that neurologist at [facility] is changing her medication. #4 persistent right arm weakness: These was new from her last visit and it remained persistent.  She denies new neurological deficits REVIEW OF SYSTEMS:   Constitutional: Denies fevers, chills or abnormal weight loss Eyes: Denies blurriness of vision Ears, nose, mouth, throat, and face: Denies mucositis or sore throat Respiratory: Denies cough, dyspnea or wheezes Cardiovascular: Denies palpitation, chest discomfort or lower extremity swelling Gastrointestinal:  Denies nausea, heartburn or change in bowel habits Skin: Denies abnormal skin rashes Lymphatics: Denies new lymphadenopathy or easy bruising Neurological:Denies numbness, tingling or new weaknesses Behavioral/Psych: Mood is stable, no new changes  All other systems were reviewed with the patient and are negative.  I have reviewed the past medical history, past surgical history, social history and family history with the patient and they are unchanged from previous note.  ALLERGIES:  is allergic to adhesive; latex; percocet; shellfish allergy; soma; and vancomycin.  MEDICATIONS:  Current Outpatient Prescriptions  Medication Sig Dispense Refill  . albuterol (PROVENTIL HFA;VENTOLIN HFA) 108 (90 BASE) MCG/ACT inhaler Inhale 2 puffs into the lungs every 6 (six) hours as needed for wheezing or shortness of breath.     . ALPRAZolam (XANAX) 1 MG tablet Take 1 mg by mouth 2 (two) times daily as needed (for seizures).     . butalbital-acetaminophen-caffeine (FIORICET, ESGIC) 50-325-40 MG per tablet TAKE 1 TABLET BY MOUTH EVERY 6 HOURS AS NEEDED FOR HEADACHE 90 tablet 0  . carisoprodol (SOMA) 350 MG  tablet Take 1 tablet (350 mg total) by mouth 4 (four) times daily. Takes with Keppra for seizure  prevention. 100 tablet 0  . cetirizine (ZYRTEC) 10 MG tablet Take 10 mg by mouth daily.    . divalproex (DEPAKOTE ER) 250 MG 24 hr tablet TAKE 2 TABLETS EVERY AM AND 3 TABLETS EVERY PM 150 tablet 2  . gabapentin (NEURONTIN) 300 MG capsule Take 3 capsules in the morning and 3 capsules in the evening 180 capsule 2  . morphine (MS CONTIN) 30 MG 12 hr tablet Take 1 tablet (30 mg total) by mouth every 12 (twelve) hours. 60 tablet 0  . morphine (MSIR) 15 MG tablet Take 1 tablet (15 mg total) by mouth every 6 (six) hours as needed for severe pain. 90 tablet 0   No current facility-administered medications for this visit.    PHYSICAL EXAMINATION: ECOG PERFORMANCE STATUS: 1 - Symptomatic but completely ambulatory  Filed Vitals:   03/10/15 1414  BP: 183/136  Pulse:   Temp:   Resp:    Filed Weights   03/10/15 1413  Weight: 179 lb (81.194 kg)    GENERAL:alert, no distress and comfortable SKIN: skin color, texture, turgor are normal, no rashes or significant lesions EYES: normal, Conjunctiva are pink and non-injected, sclera clear Musculoskeletal:no cyanosis of digits and no clubbing  NEURO: alert & oriented x 3 with fluent speech  LABORATORY DATA:  I have reviewed the data as listed    Component Value Date/Time   NA 139 02/16/2015 1310   NA 138 01/27/2014 0611   K 4.5 02/16/2015 1310   K 4.4 01/27/2014 0611   CL 99 01/27/2014 0611   CO2 28 02/16/2015 1310   CO2 26 01/27/2014 0611   GLUCOSE 95 02/16/2015 1310   GLUCOSE 115* 01/27/2014 0611   BUN 9.2 02/16/2015 1310   BUN 8 01/27/2014 0611   CREATININE 0.6 02/16/2015 1310   CREATININE 0.48* 01/27/2014 0611   CALCIUM 9.1 02/16/2015 1310   CALCIUM 9.0 01/27/2014 0611   PROT 6.7 02/16/2015 1310   PROT 6.9 09/20/2013 0455   ALBUMIN 3.8 02/16/2015 1310   ALBUMIN 3.9 09/20/2013 0455   AST 19 02/16/2015 1310   AST 12 09/20/2013 0455   ALT 21 02/16/2015 1310   ALT 8 09/20/2013 0455   ALKPHOS 44 02/16/2015 1310   ALKPHOS 40  09/20/2013 0455   BILITOT 0.40 02/16/2015 1310   BILITOT 0.7 09/20/2013 0455   GFRNONAA >90 01/27/2014 0611   GFRAA >90 01/27/2014 0611    No results found for: SPEP, UPEP  Lab Results  Component Value Date   WBC 4.9 02/16/2015   NEUTROABS 2.7 02/16/2015   HGB 13.4 02/16/2015   HCT 39.6 02/16/2015   MCV 93.2 02/16/2015   PLT 217 02/16/2015      Chemistry      Component Value Date/Time   NA 139 02/16/2015 1310   NA 138 01/27/2014 0611   K 4.5 02/16/2015 1310   K 4.4 01/27/2014 0611   CL 99 01/27/2014 0611   CO2 28 02/16/2015 1310   CO2 26 01/27/2014 0611   BUN 9.2 02/16/2015 1310   BUN 8 01/27/2014 0611   CREATININE 0.6 02/16/2015 1310   CREATININE 0.48* 01/27/2014 0611      Component Value Date/Time   CALCIUM 9.1 02/16/2015 1310   CALCIUM 9.0 01/27/2014 0611   ALKPHOS 44 02/16/2015 1310   ALKPHOS 40 09/20/2013 0455   AST 19 02/16/2015 1310   AST 12 09/20/2013 0455  ALT 21 02/16/2015 1310   ALT 8 09/20/2013 0455   BILITOT 0.40 02/16/2015 1310   BILITOT 0.7 09/20/2013 0455       RADIOGRAPHIC STUDIES: I reviewed the outside MRI comparing with prior MRI in 2015 I have personally reviewed the radiological images as listed and agreed with the findings in the report.   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 I have personally reviewed the outside MRI from [place] dated 03/02/2015 and tried to make comparison with our old MRI from December 2015. The region in the right temporal brain show persistent abnormalities, nonspecific and it is not clear to me whether this represented residual disease. I spoke with her oncologist at [facility] who felt that given the stability in the region, there is no plan for resection or further treatment. The patient had verbalized significant concerns that the region of abnormalities could  represent residual disease and she does not want to "watch and see" and disagreed with watchful observation because she fear that she might become permanently disabled should the cancer grows without further treatment. I will proceed to get the MRI downloaded in our system and to get an official read from our radiologists. I will request her radiation oncologist to get involved and to get her case presented at the next brain tumor board. The patient also desires second opinion at [facility]. She has an appointment to see her neurosurgeon in town for further discussion as well. I will call the patient once I have reach consensus about next plan of action.  Intractable chronic cluster headache  Her headaches appear to be well controlled with a combination of pain medication and Fioricet. 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 MRI of the cervical spine excluded significant stenosis/disc disease In the meantime, she'll continue to take pain medicine as needed for pain      Simple partial seizures She has a seizure disorder and is currently taking her seizure medications. She averaged about 1 episode of seizure per week Her prior neuropsychiatric evaluation suggested some memory loss and fatigue. There is recommendation from one of her doctors that she might benefit from using Ritalin or Adderall. I am not comfortable prescribing either one of these prescription because I am not sure whether that would cause increased risk of seizure I recommend she consult with her neurologist for further recommendation.   No orders of the defined types were placed in this encounter.   All questions were answered. The patient knows to call the clinic with any problems, questions or concerns. No barriers to learning was detected. I  spent 30 minutes counseling the patient face to face. The total time spent in the appointment was 40 minutes and more than 50% was on counseling and review of test results     [facility], Kenyia Wambolt, MD 03/10/2015 4:04 PM

## 2015-03-10 NOTE — Assessment & Plan Note (Signed)
I have spent a lot of time with the patient and her husband. I have personally reviewed the outside MRI from [place] dated 03/02/2015 and tried to make comparison with our old MRI from December 2015. The region in the right temporal brain show persistent abnormalities, nonspecific and it is not clear to me whether this represented residual disease. I spoke with her oncologist at [facility] who felt that given the stability in the region, there is no plan for resection or further treatment. The patient had verbalized significant concerns that the region of abnormalities could represent residual disease and she does not want to "watch and see" and disagreed with watchful observation because she fear that she might become permanently disabled should the cancer grows without further treatment. I will proceed to get the MRI downloaded in our system and to get an official read from our radiologists. I will request her radiation oncologist to get involved and to get her case presented at the next brain tumor board. The patient also desires second opinion at [facility]. She has an appointment to see her neurosurgeon in town for further discussion as well. I will call the patient once I have reach consensus about next plan of action.

## 2015-03-10 NOTE — Assessment & Plan Note (Signed)
She has persistent neuropathic pain along the C7 distribution on the right side which is exacerbated with certain head movement/position of the neck. MRI of the cervical spine excluded significant stenosis/disc disease In the meantime, she'll continue to take pain medicine as needed for pain

## 2015-03-10 NOTE — Assessment & Plan Note (Signed)
She has a seizure disorder and is currently taking her seizure medications. She averaged about 1 episode of seizure per week Her prior neuropsychiatric evaluation suggested some memory loss and fatigue. There is recommendation from one of her doctors that she might benefit from using Ritalin or Adderall. I am not comfortable prescribing either one of these prescription because I am not sure whether that would cause increased risk of seizure I recommend she consult with her neurologist for further recommendation.

## 2015-03-11 ENCOUNTER — Inpatient Hospital Stay
Admission: RE | Admit: 2015-03-11 | Discharge: 2015-03-11 | Disposition: A | Discharge status: Home / Self Care | Payer: Self-pay | Source: Ambulatory Visit | Attending: Hematology and Oncology | Admitting: Hematology and Oncology

## 2015-03-11 ENCOUNTER — Other Ambulatory Visit: Payer: Self-pay | Admitting: Hematology and Oncology

## 2015-03-11 DIAGNOSIS — C801 Malignant (primary) neoplasm, unspecified: Secondary | ICD-10-CM

## 2015-03-12 ENCOUNTER — Encounter: Payer: Self-pay | Admitting: Hematology and Oncology

## 2015-03-12 ENCOUNTER — Telehealth: Payer: Self-pay | Admitting: *Deleted

## 2015-03-12 NOTE — Telephone Encounter (Signed)
CONTACTED [facility] [place]. PRIOR AUTHORIZATION BEING SENT TO TRIAGE AND WILL BE GIVEN TO MANAGED CARE.

## 2015-03-12 NOTE — Progress Notes (Signed)
I faxed office note with nctracks form for prior auth for carisoprodol

## 2015-03-12 NOTE — Telephone Encounter (Signed)
PRIOR AUTHORIZATION RECEIVED FROM [facility] AND GIVEN TO MANAGED CARE.

## 2015-03-12 NOTE — Progress Notes (Signed)
I placed prior auth form for soma on desk of nurse for dr. Alvy Bimler

## 2015-03-13 ENCOUNTER — Telehealth: Payer: Self-pay

## 2015-03-13 NOTE — Telephone Encounter (Signed)
Ms. Ludwig is calling  to determine the status of the prior authorization status.  Told her that office notes sent into [place] track yesterday 03-12-15 by Thermon Leyland in managed care.  Gave Ms. Gage the phone number to Raquel [phone] to  follow up on status of  authorization.

## 2015-03-16 ENCOUNTER — Encounter: Payer: Self-pay | Admitting: Hematology and Oncology

## 2015-03-16 NOTE — Progress Notes (Signed)
I sent staff message to cameo and dr. Alvy Bimler on what 2 meds has she tried and failed. I am trying to get prior auth for carisoprodol.

## 2015-03-17 ENCOUNTER — Telehealth: Payer: Self-pay | Admitting: *Deleted

## 2015-03-17 ENCOUNTER — Encounter: Payer: Self-pay | Admitting: Hematology and Oncology

## 2015-03-17 NOTE — Progress Notes (Signed)
See note from dr. Alvy Bimler in reference to getting carisoprodol prior auth. It is not an FDA approved drug for seizure  Hence I cannot refill it  She has to request refill from Neurologist

## 2015-03-17 NOTE — Telephone Encounter (Signed)
Pt called about P.A. Status on Soma.   Explained to pt that unfortunately,  Dr. Alvy Bimler is unable to provide documentation to Insurance company about why Victoria Bates is indicated.  Pt's Neurologist, Dr. Christella Noa, had originally ordered the [facility] along w/ pt's Keppra for Seizure prevention but this is not FDA approved use.  Dr. Alvy Bimler is not expert in seizure prevention to support it's use.  She is willing to send the Rx as a courtesy to pt so she doesn't have to call several different MD offices for her med refills, but she cannot do the P.A. In this case.  Pt will need to call Dr. Lacy Duverney office for new Rx and have him do the Prior Auth.    Pt voiced understanding and will call Dr. Lacy Duverney office.  States she got half the Rx filled last week and had to pay out of pocket.  If she is not able to get Rx filled or PA done by Dr. Christella Noa this week she may call us for another Rx and pay out of pocket again for another week supply until it is approved by her insurance company.

## 2015-03-19 ENCOUNTER — Other Ambulatory Visit: Payer: Self-pay | Admitting: Hematology and Oncology

## 2015-03-19 ENCOUNTER — Inpatient Hospital Stay
Admission: RE | Admit: 2015-03-19 | Discharge: 2015-03-19 | Disposition: A | Discharge status: Home / Self Care | Payer: Self-pay | Source: Ambulatory Visit | Attending: Hematology and Oncology | Admitting: Hematology and Oncology

## 2015-03-19 DIAGNOSIS — C711 Malignant neoplasm of frontal lobe: Secondary | ICD-10-CM

## 2015-03-24 ENCOUNTER — Encounter: Payer: Self-pay | Admitting: Hematology and Oncology

## 2015-03-24 ENCOUNTER — Telehealth: Payer: Self-pay | Admitting: Hematology and Oncology

## 2015-03-24 NOTE — Telephone Encounter (Signed)
I spoke with Dr. Nevada Crane the radiologist who has kindly reviewed her most recent MRI from [place] comparing with her pervious MRI done here. According to the radiologist, the overall appearance of the imaging study is stable without signs of disease progression. I also spoke with Dr. Tammi Klippel, the radiation oncologist about the patient. Her case will be presented at the next brain tumor board next week for further discussion

## 2015-03-25 ENCOUNTER — Telehealth: Payer: Self-pay | Admitting: *Deleted

## 2015-03-25 DIAGNOSIS — R202 Paresthesia of skin: Secondary | ICD-10-CM

## 2015-03-25 DIAGNOSIS — R110 Nausea: Secondary | ICD-10-CM

## 2015-03-25 DIAGNOSIS — R51 Headache: Secondary | ICD-10-CM

## 2015-03-25 DIAGNOSIS — R0789 Other chest pain: Secondary | ICD-10-CM

## 2015-03-25 DIAGNOSIS — R519 Headache, unspecified: Secondary | ICD-10-CM

## 2015-03-25 DIAGNOSIS — R4702 Dysphasia: Secondary | ICD-10-CM

## 2015-03-25 DIAGNOSIS — C711 Malignant neoplasm of frontal lobe: Secondary | ICD-10-CM

## 2015-03-25 MED ORDER — CARISOPRODOL 350 MG PO TABS: 350.0000 mg | ORAL_TABLET | Freq: Four times a day (QID) | ORAL | Status: DC

## 2015-03-25 NOTE — Telephone Encounter (Signed)
Cameo, please let Anneka know I spoke with Dr. Nevada Crane, radiologist. He reviewed the scans with me and he see NO CHANGE in the area since last year. Dr. Tammi Klippel is planning to discuss her case at the next Brain tumor board next week. I am not sure what to do with Soma. I recommend she discuss with Dr. Lupita Dawn since it was not authorized

## 2015-03-25 NOTE — Telephone Encounter (Signed)
[facility]  Informed rx for 90 tablets at a time.  She verbalized understanding. Informed pt of Dr. Calton Dach message below regarding her MRI.   Pt asks if MRI from [place] was compared to her most recent MRI here earlier this year or was it compared to her MRI "pre surgery" last March 2015?   Pt was hoping that it was compared to her  "Pre Surgery MRI."   Informed pt it was most likely only compared to most recent MRI done here in our system, but I will ask Dr. Alvy Bimler and let her know.

## 2015-03-25 NOTE — Telephone Encounter (Signed)
Patient called stating that she would like to have an update on any new information that was discussed between MD Alvy Bimler and MD Tammi Klippel concerning her recent MRI done at [facility]. Patient will be seeing MD cabbell tomorrow and would like to discuss that information with them. Patient would like a refill sent in electronically of Manuela Neptune (it is not authorized). Message sent to Cameo RN for refill and MD Alvy Bimler.

## 2015-03-27 ENCOUNTER — Other Ambulatory Visit: Payer: Self-pay | Admitting: *Deleted

## 2015-03-27 MED ORDER — MORPHINE SULFATE ER 30 MG PO TBCR: 30.0000 mg | EXTENDED_RELEASE_TABLET | Freq: Two times a day (BID) | ORAL | Status: DC

## 2015-03-27 MED ORDER — BUTALBITAL-APAP-CAFFEINE 50-325-40 MG PO TABS: ORAL_TABLET | ORAL | Status: DC

## 2015-03-27 NOTE — Telephone Encounter (Signed)
Pt left VM needs refill on Fioricet and MS Contin.  Her husband will pick up..  Informed husband of Rxs ready to pick up.

## 2015-03-30 ENCOUNTER — Ambulatory Visit (HOSPITAL_BASED_OUTPATIENT_CLINIC_OR_DEPARTMENT_OTHER): Payer: Medicaid Other | Admitting: Hematology and Oncology

## 2015-03-30 ENCOUNTER — Telehealth: Payer: Self-pay | Admitting: *Deleted

## 2015-03-30 DIAGNOSIS — C711 Malignant neoplasm of frontal lobe: Secondary | ICD-10-CM | POA: Diagnosis present

## 2015-03-30 NOTE — Telephone Encounter (Signed)
Pt says she will come in today at 3:30 pm.

## 2015-03-30 NOTE — Telephone Encounter (Signed)
-----   Message from Heath Lark, MD sent at 03/30/2015  1:02 PM EDT ----- Regarding: tumor board I have spoken with Dr. Tammi Klippel. Recommendation from TB is observation. Or I can refer her to [facility] for second opinion. Might be better if I can see her face to face. Can she come in today at 3 or 330 pm of Monday 1230 pm?

## 2015-03-31 ENCOUNTER — Encounter: Payer: Self-pay | Admitting: Hematology and Oncology

## 2015-03-31 ENCOUNTER — Telehealth: Payer: Self-pay | Admitting: *Deleted

## 2015-03-31 NOTE — Telephone Encounter (Signed)
Voicemail from [place] who reports a "non-urgent question/thought for Dr. Alvy Bimler about a thought she's had about brain tumors which was discussed yesterday.  Would like a call from her as I do not want this in the computer system or left on voicemail.  When she has time, if she would call me directly at [phone]."

## 2015-03-31 NOTE — Progress Notes (Signed)
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   09/27/2013 Pathology Results Accession: [id number] pathology showed grade 2 oligodendroglioma with mutant IDH1 protein, ATRX, OLIG 2 expression. p53 is negative, low Ki-67 labeling index. 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   11/06/2013 Imaging Repeat CT scan of the head showed resolution of abscess.   01/23/2014 Surgery Dr. Christella Noa perform a subtotal gross resection of the tumor.   01/24/2014 Imaging Repeat imaging studies showed persistent and residual disease.   03/03/2014 - 09/01/2014 Chemotherapy She was started on Temodar, 5 days every 28 days   03/13/2014 Imaging Repeat MRI showed no evidence of progression of disease.   07/14/2014 Imaging Repeat MRI showed no evidence of disease progression.   09/12/2014 Imaging MRI of the liver show mild regression of the size of lesions, presumed to be benign   09/15/2014 Imaging Repeat MRI of the brain show persistent abnormalities, suspicious for residual disease   03/02/2015 Imaging  MRI brain at [facility] show persistent abnormalities    INTERVAL HISTORY: Please see below for problem oriented charting. She feels well. She continues to have persistent numbness and weakness in the right upper extremity She denies worsening seizure control worsening pain.  REVIEW OF SYSTEMS:   Constitutional: Denies fevers, chills or abnormal weight loss Eyes: Denies blurriness of vision Ears, nose, mouth, throat, and face: Denies mucositis or sore throat Respiratory: Denies cough, dyspnea or  wheezes Cardiovascular: Denies palpitation, chest discomfort or lower extremity swelling Gastrointestinal:  Denies nausea, heartburn or change in bowel habits Skin: Denies abnormal skin rashes Lymphatics: Denies new lymphadenopathy or easy bruising Neurological:Denies numbness, tingling or new weaknesses Behavioral/Psych: Mood is stable, no new changes  All other systems were reviewed with the patient and are negative.  I have reviewed the past medical history, past surgical history, social history and family history with the patient and they are unchanged from previous note.  ALLERGIES:  is allergic to adhesive; latex; percocet; shellfish allergy; soma; and vancomycin.  MEDICATIONS:  Current Outpatient Prescriptions  Medication Sig Dispense Refill  . albuterol (PROVENTIL HFA;VENTOLIN HFA) 108 (90 BASE) MCG/ACT inhaler Inhale 2 puffs into the lungs every 6 (six) hours as needed for wheezing or shortness of breath.     . ALPRAZolam (XANAX) 1 MG tablet Take 1 mg by mouth 2 (two) times daily as needed (for seizures).     . butalbital-acetaminophen-caffeine (FIORICET, ESGIC) 50-325-40 MG per tablet TAKE 1 TABLET BY MOUTH EVERY 6 HOURS AS NEEDED FOR HEADACHE 90 tablet 0  . carisoprodol (SOMA) 350 MG tablet Take 1 tablet (350 mg total) by mouth 4 (four) times daily. For seizure prevention 90 tablet 5  . cetirizine (ZYRTEC) 10 MG tablet Take 10 mg by mouth daily.    . divalproex (DEPAKOTE ER) 250 MG 24 hr tablet TAKE 2 TABLETS EVERY AM AND 3 TABLETS EVERY PM 150 tablet 2  . gabapentin (NEURONTIN) 300 MG capsule Take 3 capsules in the morning and 3 capsules in the evening 180 capsule 2  . morphine (MS CONTIN) 30 MG 12 hr tablet Take 1 tablet (30 mg total) by mouth every 12 (twelve) hours.  60 tablet 0  . morphine (MSIR) 15 MG tablet Take 1 tablet (15 mg total) by mouth every 6 (six) hours as needed for severe pain. 90 tablet 0   No current facility-administered medications for this visit.     PHYSICAL EXAMINATION: ECOG PERFORMANCE STATUS: 1 - Symptomatic but completely ambulatory  There were no vitals filed for this visit. There were no vitals filed for this visit.  GENERAL:alert, no distress and comfortable SKIN: skin color, texture, turgor are normal, no rashes or significant lesions EYES: normal, Conjunctiva are pink and non-injected, sclera clear Musculoskeletal:no cyanosis of digits and no clubbing  NEURO: alert & oriented x 3 with fluent speech, with mild right upper extremity weakness  LABORATORY DATA:  I have reviewed the data as listed    Component Value Date/Time   NA 139 02/16/2015 1310   NA 138 01/27/2014 0611   K 4.5 02/16/2015 1310   K 4.4 01/27/2014 0611   CL 99 01/27/2014 0611   CO2 28 02/16/2015 1310   CO2 26 01/27/2014 0611   GLUCOSE 95 02/16/2015 1310   GLUCOSE 115* 01/27/2014 0611   BUN 9.2 02/16/2015 1310   BUN 8 01/27/2014 0611   CREATININE 0.6 02/16/2015 1310   CREATININE 0.48* 01/27/2014 0611   CALCIUM 9.1 02/16/2015 1310   CALCIUM 9.0 01/27/2014 0611   PROT 6.7 02/16/2015 1310   PROT 6.9 09/20/2013 0455   ALBUMIN 3.8 02/16/2015 1310   ALBUMIN 3.9 09/20/2013 0455   AST 19 02/16/2015 1310   AST 12 09/20/2013 0455   ALT 21 02/16/2015 1310   ALT 8 09/20/2013 0455   ALKPHOS 44 02/16/2015 1310   ALKPHOS 40 09/20/2013 0455   BILITOT 0.40 02/16/2015 1310   BILITOT 0.7 09/20/2013 0455   GFRNONAA >90 01/27/2014 0611   GFRAA >90 01/27/2014 0611    No results found for: SPEP, UPEP  Lab Results  Component Value Date   WBC 4.9 02/16/2015   NEUTROABS 2.7 02/16/2015   HGB 13.4 02/16/2015   HCT 39.6 02/16/2015   MCV 93.2 02/16/2015   PLT 217 02/16/2015      Chemistry      Component Value Date/Time   NA 139 02/16/2015 1310   NA 138 01/27/2014 0611   K 4.5 02/16/2015 1310   K 4.4 01/27/2014 0611   CL 99 01/27/2014 0611   CO2 28 02/16/2015 1310   CO2 26 01/27/2014 0611   BUN 9.2 02/16/2015 1310   BUN 8 01/27/2014 0611    CREATININE 0.6 02/16/2015 1310   CREATININE 0.48* 01/27/2014 0611      Component Value Date/Time   CALCIUM 9.1 02/16/2015 1310   CALCIUM 9.0 01/27/2014 0611   ALKPHOS 44 02/16/2015 1310   ALKPHOS 40 09/20/2013 0455   AST 19 02/16/2015 1310   AST 12 09/20/2013 0455   ALT 21 02/16/2015 1310   ALT 8 09/20/2013 0455   BILITOT 0.40 02/16/2015 1310   BILITOT 0.7 09/20/2013 0455       RADIOGRAPHIC STUDIES: I reviewed multiple imaging studies with the patient today I have personally reviewed the radiological images as listed and agreed with the findings in the report.  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 I review for different imaging studies dated all the way to August 2015. Overall, the abnormalities seen in the right temporal region appears stable. These imaging studies were reviewed with the radiologist  and were reviewed recently at the most recent brain tumor board. I have also discussed this case with her radiation oncologist today. Overall consensus would be observation. The patient is concerned about the abnormalities seen and could not understand why further resection is not recommended. I reinforced to the patient that while residual disease is possible, in the absence of significant progression, any further attempt for biopsy or surgical resection could result with more debilitating neurological deficit and possibly worsening seizure control. The patient is getting frequent imaging study at [facility] every 8 weeks each I assured the patient is an aggressive surveillance observation and I reassured the patient that based on our understanding of low-grade brain tumor, the frequency of surveillance imaging would be adequate to catch disease progression at an early stage. The patient expressed interest to obtain another opinion at [facility]. I will  facilitate the referral. In the meantime, the patient will keep her appointment to go back to [facility] to see medical oncologist and also her appointment here with local neurologist for seizure control.   No orders of the defined types were placed in this encounter.   All questions were answered. The patient knows to call the clinic with any problems, questions or concerns. No barriers to learning was detected. I spent 55 minutes counseling the patient face to face. The total time spent in the appointment was 60 minutes and more than 50% was on counseling and review of test results     [facility], [place], MD 03/31/2015 8:06 AM

## 2015-03-31 NOTE — Assessment & Plan Note (Signed)
I have extensive discussion with Estill Bamberg. I review for different imaging studies dated all the way to August 2015. Overall, the abnormalities seen in the right temporal region appears stable. These imaging studies were reviewed with the radiologist and were reviewed recently at the most recent brain tumor board. I have also discussed this case with her radiation oncologist today. Overall consensus would be observation. The patient is concerned about the abnormalities seen and could not understand why further resection is not recommended. I reinforced to the patient that while residual disease is possible, in the absence of significant progression, any further attempt for biopsy or surgical resection could result with more debilitating neurological deficit and possibly worsening seizure control. The patient is getting frequent imaging study at [facility] every 8 weeks each I assured the patient is an aggressive surveillance observation and I reassured the patient that based on our understanding of low-grade brain tumor, the frequency of surveillance imaging would be adequate to catch disease progression at an early stage. The patient expressed interest to obtain another opinion at [facility]. I will facilitate the referral. In the meantime, the patient will keep her appointment to go back to [facility] to see medical oncologist and also her appointment here with local neurologist for seizure control.

## 2015-04-01 ENCOUNTER — Encounter: Payer: Self-pay | Admitting: Neurology

## 2015-04-01 ENCOUNTER — Ambulatory Visit (INDEPENDENT_AMBULATORY_CARE_PROVIDER_SITE_OTHER): Payer: Medicaid Other | Admitting: Neurology

## 2015-04-01 VITALS — BP 110/82 | HR 85 | Resp 16 | Wt 174.0 lb

## 2015-04-01 DIAGNOSIS — C719 Malignant neoplasm of brain, unspecified: Secondary | ICD-10-CM | POA: Diagnosis not present

## 2015-04-01 DIAGNOSIS — G40109 Localization-related (focal) (partial) symptomatic epilepsy and epileptic syndromes with simple partial seizures, not intractable, without status epilepticus: Secondary | ICD-10-CM | POA: Diagnosis not present

## 2015-04-01 NOTE — Telephone Encounter (Signed)
I spoke with Victoria Bates and addressed multiple questions. #1 we discussed the rationale for tertiary referral to [facility] and she agreed #2 the patient wants to wean herself off pain medicine and switch over to tramadol in the future. I encouraged her to do so and once she is down to half a tablet of immediate release morphine twice a day or less, I will switch her over to tramadol #3 we discussed the rationale of not giving her Temodar

## 2015-04-01 NOTE — Patient Instructions (Signed)
1. Refer to [facility] for intractable headaches 2. Continue all your medications 3. Minimize Soma intake as much as you can 4. Consider therapy for stress management, call if you would like to proceed 5. Follow-up in 4-5 months  Seizure Precautions: 1. If medication has been prescribed for you to prevent seizures, take it exactly as directed.  Do not stop taking the medicine without talking to your doctor first, even if you have not had a seizure in a long time.   2. Avoid activities in which a seizure would cause danger to yourself or to others.  Don't operate dangerous machinery, swim alone, or climb in high or dangerous places, such as on ladders, roofs, or girders.  Do not drive unless your doctor says you may.  3. If you have any warning that you may have a seizure, lay down in a safe place where you can't hurt yourself.    4.  No driving for 6 months from last seizure, as per [facility].   Please refer to the following link on the [place] website for more information: http://[email or web address]/drivingu.cfm   5.  Maintain good sleep hygiene.  6.  Notify your neurology if you are planning pregnancy or if you become pregnant.  7.  Contact your doctor if you have any problems that may be related to the medicine you are taking.  8.  Call 911 and bring the patient back to the ED if:        A.  The seizure lasts longer than 5 minutes.       B.  The patient doesn't awaken shortly after the seizure  C.  The patient has new problems such as difficulty seeing, speaking or moving  D.  The patient was injured during the seizure  E.  The patient has a temperature over 102 F (39C)  F.  The patient vomited and now is having trouble breathing

## 2015-04-01 NOTE — Progress Notes (Signed)
NEUROLOGY FOLLOW UP OFFICE NOTE  YARIANNA VARBLE [id number]  HISTORY OF PRESENT ILLNESS: I had the pleasure of seeing Hiromi Knodel in follow-up in the neurology clinic on 04/01/2015.  The patient was last seen 2 months ago for seizures. She has a history of a right frontal oligodendroglioma s/p subtotal resection. Records from [facility] were reviewed. She has seen Oncology and Neurology at [facility]. Per oncology, all evaluations stable. She will remain off therapy and return in October for follow-up MRI. She was referred to Dr. Amie Portland for use of CNS stimulants. She saw epileptologist Dr. Docia Chuck at [facility], she had reported improvement in symptoms with higher dose Depakote, currently on 567m in AM, 7533min PM. She continued to endorse sleepiness, and foggy thoughts. She asked about a stimulant, he discussed with her that she is on several medications that can cause these symptoms, particularly the gabapentin and Soma. She tried to reduce Soma to 60056mID, but reports having a seizure while having an MRI. She increased back to 900m41mD, and reports that this works well for her. She states she does not understand the mentality behind reducing her medications because her symptoms are more controlled than they have been in a long time. She denies any bigger seizures, she reports having a "little episode" on Saturday, symptoms do not last as long and she does not feel as bad after. She reports continued episodes of sensation of jerking on the right side of her mouth, yesterday was bad. She takes SomaManufacturing engineer this, sometimes twice a day, depending on how she is feeling that day. She states some days she does not take it. She rarely takes Xanax. She expressed frustration that "no one wants to manage my medications," she was reporting low energy and was going to be referred to another doctor. She states she is tired of seeing different doctors every week.   One of her main concerns is the continued headaches. She  reports pain is from the back of her neck to the right temporal region. She takes prn morphine, taking the immediate-release to knock the headache down, and if still persisting, she would take MS Contin. This is being prescribed by her oncologist. She is interested in seeing a headache specialist.   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 [facility] due to left vision changes and left facial heaviness. She reports that she had a 60-lb unintentional weight loss 4 months leading up to her admission, she was vomiting after every meal. She started having episodes where her hands would have low amplitude tremors and she feels the right lower face twitching. She would need to pause and take a minute to get words out. If she did not stop what she was doing, the symptoms could last for hours. She would feel like her body was "going 90 mph" with palpitations up to 200 bpm. She was started on Xanax for anxiety, but this did not help. She then started having vision changes on her left eye, she would park crookedly when she thought she was going straight. She saw an ophthalmologist, eye exam was normal but left optic neuritis was questioned and brain imaging recommended. She then started to have left facial heaviness and numbness on the left face, arm, and leg, [facility] to [facility] right  frontal lesion. She underwent subtotal resection on 01/23/14 by Dr. Christella Noa and was started on Keppra for seizure prophylaxis at that time. She continued to have the recurrent episodes of tremors in both arms, palpitations, and right facial tightness, as well as episodes of left face, neck, shoulder, and arm numbness, going down the left thumb and index finger. She describes her major seizures as "all of that times 100." After the episodes, she  would be tired for a few days, wanting to sleep, with no energy. She has noticed that her short term memory is "gone." Keppra dose had been increased up to 2 grams daily, and this had caused a change in personality. She had seen neurologist Dr. Leonie Man, who switched her to Depakote. She tapered off Keppra and is currently on Depakote ER 51m BID with significant improvement in symptoms. She denies any side effects to the Depakote.   She has a history of recurrent headaches since her teenage years. Headaches are usually over the right temporal and occipital region, with sharp stabbing and throbbing pain, with a knot in her left neck region. There is occasional nausea, photo, and phonophobia. She was having these headaches around 1-2 times a week when younger, however since December 2014, headaches have been occurring around 2-3 times a week. She had been receiving injections in her neck for this. If she takes medication at the onset, they last a few hours, however they may last up to several days. She has found that taking Decadron for a couple of days has been the only helpful medication. She takes Fioricet rarely. Zomig, aspirin, and Tylenol were ineffective in the past. Her father and 177year old son have headaches. She denies any diplopia, dysarthria, dysphagia, bowel/bladder dysfunction. She has chronic back pain and had been taking Soma qid for several years, with worsening of pain when she tried reducing this. She was started on Gabapentin for the seizures and back pain, she is unsure if this helps. She had been taking Ultram for the pain, and once stopping this, she noticed an improvement in the seizures as well.   Diagnostic Data: [facility] in the right frontotemporal regions which is likely from breach artifact from prior craniotomy. There is intermittent right frontal sharp activity but no definite epileptiform activity. Impression: Mild right hemispheric  focal cortical irritability but no definitive epileptiform activity is noted.  Her 48-hour EEG did not show any epileptiform discharges. It showed breach artifact consistent with prior surgery. She reported numerous symptoms including headache, severe muscle spasm under the base of her skull on the right side, tremors in both hands, jerking and pause in jaw (right side), left leg/inner thigh and left foot warm and pigmented, eyes twitching, fatigue, with no associated epileptiform correlate seen. [facility]: Depakote and Gabapentin were held. Typical events were not clearly captured, however she reported 2 spells where she reported dizziness and visual changes (tiles on floor looked uneven) with no EEG correlate. Baseline EEG showed breach rhythm over the right hemisphere, no epileptiform discharges or electrographic seizures.  I personally reviewed MRI brain with and without contrast done 03/13/14 which showed post-op right-sided craniotomy. The surgical cavity in the right frontal lobe is filled with fluid, with note of improvement in layering blood products since prior study. The study of 01/24/2014 showed restricted diffusion deep to the tumor. This is consistent with acute infarct which is resolving at  this time. No acute infarct is present on today's study. No evidence of recurrent or residual tumor although the original tumor did not enhance. No mass effect or edema is present that would suggest recurrent tumor. Small amount of subarachnoid hemorrhage on the right again noted. MRI 08/2014 which I personally reviewed, there is similar pattern of enlargement and abnormal FLAIR signal in the right frontal gyrus immediately superior to the resection, stable from previous scan.   Epilepsy Risk Factors: Right frontal oligodendroglioma s/p subtotal resection. Otherwise she had a normal birth and early development. There is no history of febrile  convulsions, CNS infections such as meningitis/encephalitis, or family history of seizures.  PAST MEDICAL HISTORY: Past Medical History  Diagnosis Date  . Liver hemangioma   . Anemia   . Headache(784.0)     migraines  . Anxiety   . Asthma     very mild- no inhaler use since July 2013  . Complication of anesthesia     takes longer for patient to go under anesthesia  . Brain tumor     Hx: of  . Infection     to right head incision  . Petit mal seizure status   . Dizziness   . Seizures     petit mal serizures most days  . Herniated disc   . Nausea alone 02/12/2014  . Dysphasia 02/12/2014  . Headache 02/12/2014  . Neuropathic pain 04/08/2014  . Liver lesion 08/22/2014  . 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 Chronic fatigue 10/03/2014  . Hot flashes 10/03/2014  . Cancer 2003    skin  . Glioma, malignant dx'd 09/2013    MEDICATIONS: Current Outpatient Prescriptions on File Prior to Visit  Medication Sig Dispense Refill  . albuterol (PROVENTIL HFA;VENTOLIN HFA) 108 (90 BASE) MCG/ACT inhaler Inhale 2 puffs into the lungs every 6 (six) hours as needed for wheezing or shortness of breath.     . butalbital-acetaminophen-caffeine (FIORICET, ESGIC) 50-325-40 MG per tablet TAKE 1 TABLET BY MOUTH EVERY 6 HOURS AS NEEDED FOR HEADACHE 90 tablet 0  . carisoprodol (SOMA) 350 MG tablet Take 1 tablet (350 mg total) by mouth 4 (four) times daily. For seizure prevention 90 tablet 5  . cetirizine (ZYRTEC) 10 MG tablet Take 10 mg by mouth daily.    . divalproex (DEPAKOTE ER) 250 MG 24 hr tablet TAKE 2 TABLETS EVERY AM AND 3 TABLETS EVERY PM 150 tablet 2  . gabapentin (NEURONTIN) 300 MG capsule Take 3 capsules in the morning and 3 capsules in the evening 180 capsule 2  . morphine (MS CONTIN) 30 MG 12 hr tablet Take 1 tablet (30 mg total) by mouth every 12 (twelve) hours. 60 tablet 0  . morphine (MSIR) 15 MG tablet Take 1 tablet (15 mg total) by mouth every 6 (six) hours as needed for severe pain. 90 tablet 0    . ALPRAZolam (XANAX) 1 MG tablet Take 1 mg by mouth 2 (two) times daily as needed (for seizures).      No current facility-administered medications on file prior to visit.    ALLERGIES: Allergies  Allergen Reactions  . Adhesive [Tape] Other (See Comments)    Skin tear  . Latex Hives  . Percocet [Oxycodone-Acetaminophen] Other (See Comments)    makes pt angry  . Shellfish Allergy Diarrhea and Nausea And Vomiting  . Soma [Carisoprodol]     Watson brand, specifically, causes disorientation (can take other brands)  . Vancomycin Hives    FAMILY HISTORY: Family History  Problem  Relation Age of Onset  . Hypertension Mother   . Skin cancer Mother   . Thyroid cancer Father   . Skin cancer Father   . Breast cancer Maternal Aunt     dx in her 19s  . Prostate cancer Maternal Uncle 63  . Melanoma Maternal Uncle   . Ovarian cancer Maternal Grandmother     dx in her late 46s-40s  . Breast cancer Maternal Aunt     bilateral breast cancer dx in 72s and 48s; possibly BRCA+  . Other Son     multiple CAL spots, bright spots on brain MRI, ? chiari malformation    SOCIAL HISTORY: Social History   Social History  . Marital Status: Single    Spouse Name: N/A  . Number of Children: 2  . Years of Education: College   Occupational History  . laser tech    Social History Main Topics  . Smoking status: Never Smoker   . Smokeless tobacco: Never Used  . Alcohol Use: No  . Drug Use: No  . Sexual Activity: Yes    Birth Control/ Protection: None   Other Topics Concern  . Not on file   Social History Narrative   Patient lives at home with her two children.   Caffeine use: minimum of 4 cups daily    REVIEW OF SYSTEMS: Constitutional: No fevers, chills, or sweats, no generalized fatigue, change in appetite Eyes: No visual changes, double vision, eye pain Ear, nose and throat: No hearing loss, ear pain, nasal congestion, sore throat Cardiovascular: No chest pain,  palpitations Respiratory:  No shortness of breath at rest or with exertion, wheezes GastrointestinaI: No nausea, vomiting, diarrhea, abdominal pain, fecal incontinence Genitourinary:  No dysuria, urinary retention or frequency Musculoskeletal:  No neck pain, back pain Integumentary: No rash, pruritus, skin lesions Neurological: as above Psychiatric: No depression, insomnia, +anxiety Endocrine: No palpitations, fatigue, diaphoresis, mood swings, change in appetite, change in weight, increased thirst Hematologic/Lymphatic:  No anemia, purpura, petechiae. Allergic/Immunologic: no itchy/runny eyes, nasal congestion, recent allergic reactions, rashes  PHYSICAL EXAM: Filed Vitals:   04/01/15 1032  BP: 110/82  Pulse: 85  Resp: 16   General: No acute distress Head:  Normocephalic/atraumatic Neck: supple, no paraspinal tenderness, full range of motion Heart:  Regular rate and rhythm Lungs:  Clear to auscultation bilaterally Back: No paraspinal tenderness Skin/Extremities: No rash, no edema Neurological Exam: alert and oriented to person, place, and time. No aphasia or dysarthria. Fund of knowledge is appropriate.  Recent and remote memory are intact.  Attention and concentration are normal.    Able to name objects and repeat phrases. Cranial nerves: Pupils equal, round, reactive to light.  Fundoscopic exam unremarkable, no papilledema. Extraocular movements intact with no nystagmus. Visual fields full. Facial sensation intact. No facial asymmetry. Tongue, uvula, palate midline.  Motor: Bulk and tone normal, muscle strength 5/5 throughout with no pronator drift.  Sensation to light touch intact.  No extinction to double simultaneous stimulation.  Deep tendon reflexes 2+ throughout, toes downgoing.  Finger to nose testing intact.  Gait narrow-based and steady, able to tandem walk adequately.  Romberg negative.  IMPRESSION: This is a 34 yo RH woman with a history of grade 2 oligodengroglioma s/p  subtotal resection on chemotherapy, anxiety, headaches, with recurrent episodes of low amplitude tremors, palpitations, and right facial tightening, as well as left face and arm numbness. She had a 48-hour EEG which did not show any epileptiform discharges. She recently was evaluated by [facility]  Epilepsy, and 3-day video EEG monitoring interictally showed breach artifact, otherwise no epileptiform discharges. She did not have typical events and hence these could not be commented on. She reports better symptom control on Depakote ER 559m in AM, 7549min PM and Gabapentin 90062mID. Due to report of fogginess and sleepiness, dose of gabapentin was reduced but she had a seizure and self-increased back to current dose. She reports SomManuela Neptunelps with the jerking in her face, but states she does not take this on a daily basis. We discussed effects of mood on energy, and consideration for seeing a therapist, she would like to hold off for now. She has been taking morphine for the headaches and likely has a component of medication overuse, she is agreeable to referral to the [facility]. She will follow-up in 4-5 months. She is aware of [place] driving laws to stop driving after an episode of loss of consciousness/awareness until 6 months event-free.   Thank you for allowing me to participate in her care.  Please do not hesitate to call for any questions or concerns.  The duration of this appointment visit was 25 minutes of face-to-face time with the patient.  Greater than 50% of this time was spent in counseling, explanation of diagnosis, planning of further management, and coordination of care.   KarEllouise Newer.D.   CC: Dr. GriLaurann Montanar. GorAlvy Bimler

## 2015-04-02 ENCOUNTER — Telehealth: Payer: Self-pay | Admitting: *Deleted

## 2015-04-02 NOTE — Telephone Encounter (Signed)
Paula from the [facility] called they are not taking any new medicaid patients at this time

## 2015-04-03 NOTE — Telephone Encounter (Signed)
Any other advisement?

## 2015-04-08 ENCOUNTER — Telehealth: Payer: Self-pay | Admitting: *Deleted

## 2015-04-08 ENCOUNTER — Telehealth: Payer: Self-pay | Admitting: Hematology and Oncology

## 2015-04-08 NOTE — Telephone Encounter (Signed)
Faxed pt medical records to Dr Salomon Fick for review.  Waiting for appt.

## 2015-04-08 NOTE — Telephone Encounter (Signed)
Pt left VM states she discussed switching from morphine to tramadol.  Says she has stopped using ms contin and taking ms IR 1/2 tablet as needed for her headache pain.  She asks if she can have a "small refill" of MS IR and a new Rx for Tramadol?  Her plan is to continue to taper off the ms ir and start using the tramadol if ok  With Dr. Alvy Bimler.

## 2015-04-09 ENCOUNTER — Other Ambulatory Visit: Payer: Self-pay | Admitting: Hematology and Oncology

## 2015-04-09 ENCOUNTER — Telehealth: Payer: Self-pay | Admitting: *Deleted

## 2015-04-09 DIAGNOSIS — M792 Neuralgia and neuritis, unspecified: Secondary | ICD-10-CM

## 2015-04-09 MED ORDER — TRAMADOL HCL 50 MG PO TABS: 100.0000 mg | ORAL_TABLET | Freq: Four times a day (QID) | ORAL | Status: DC | PRN

## 2015-04-09 MED ORDER — MORPHINE SULFATE 15 MG PO TABS: 15.0000 mg | ORAL_TABLET | Freq: Four times a day (QID) | ORAL | Status: DC | PRN

## 2015-04-09 NOTE — Telephone Encounter (Signed)
I wrote 90 tabs for both just in case Tramadol dose written is for 100 mg; she can try 50 mg first and see how she feels

## 2015-04-09 NOTE — Telephone Encounter (Signed)
LVM for pt informing that referral has been made to [facility] and she should expect call from them for appt..  Also informed her of Rx for MS IR and Tramadol ready to pick up.  Please call us back if any questions or concerns.

## 2015-04-10 ENCOUNTER — Telehealth: Payer: Self-pay | Admitting: *Deleted

## 2015-04-10 NOTE — Telephone Encounter (Signed)
Pt reports she has heard from [facility].  They need her scans on disc and she is going to take them herself on Monday.  They told her they may want to have her do MRI there before they make appt to see her.  They will call her after Dr. Salomon Fick reviews the scans.  Victoria Bates says they told her they didn't get any records from [place].  I informed her the records were faxed earlier this week, but I will fax them again.  Victoria Bates says they want Dr. Calton Dach notes and notes from [facility].  She says they are also missing a report of chemotherapy she has been on.   Informed her that she has only been on Temodar and this is in Dr. Calton Dach note.   I faxed over most recent Med Onc and Rad Onc notes to [facility] at fax [phone].     Pt also wants Dr. Alvy Bimler to know she has been having some difficulty swallowing over the past few weeks.  She says no problem swallowing food, but it is "the small things" like her own saliva and a single pill she has trouble getting down.  She can swallow "a bunch of pills together" but has difficulty if she tries to only take one at a time.  At times she can't get herself to swallow her own saliva.  States this is scary.  Says she has to really "concentrate" to swallow at times.  Instructed pt to go to ED if she ever feels choked, but to be careful and deliberate in her swallowing   I will send this message to Dr. Alvy Bimler and call her back on Monday.  Pt verbalized understanding.

## 2015-04-13 ENCOUNTER — Telehealth: Payer: Self-pay | Admitting: *Deleted

## 2015-04-13 DIAGNOSIS — C711 Malignant neoplasm of frontal lobe: Secondary | ICD-10-CM

## 2015-04-13 DIAGNOSIS — R569 Unspecified convulsions: Secondary | ICD-10-CM

## 2015-04-13 DIAGNOSIS — C719 Malignant neoplasm of brain, unspecified: Secondary | ICD-10-CM

## 2015-04-13 NOTE — Telephone Encounter (Signed)
If OK with her, I will refer her to [facility] for swallow eval

## 2015-04-13 NOTE — Telephone Encounter (Signed)
LVM informing pt of referral to Speech pathologist for swallowing eval based on her report of difficulty swallowing.

## 2015-04-16 ENCOUNTER — Telehealth: Payer: Self-pay | Admitting: Neurology

## 2015-04-16 MED ORDER — GABAPENTIN 300 MG PO CAPS
ORAL_CAPSULE | ORAL | Status: DC
Start: 2015-04-16 — End: 2015-10-05

## 2015-04-16 MED ORDER — DIVALPROEX SODIUM ER 250 MG PO TB24: ORAL_TABLET | ORAL | Status: DC

## 2015-04-16 NOTE — Telephone Encounter (Signed)
90 day Rx's were sent to patient's pharmacy.

## 2015-04-24 ENCOUNTER — Telehealth: Payer: Self-pay | Admitting: *Deleted

## 2015-04-24 MED ORDER — TRAMADOL HCL 50 MG PO TABS: 100.0000 mg | ORAL_TABLET | Freq: Four times a day (QID) | ORAL | Status: DC | PRN

## 2015-04-24 MED ORDER — BUTALBITAL-APAP-CAFFEINE 50-325-40 MG PO TABS: ORAL_TABLET | ORAL | Status: DC

## 2015-04-24 NOTE — Telephone Encounter (Signed)
Pt unable to come over to pick up prescriptions. Fioricet and Tramadol phoned into [facility]. Paper RXs shredded

## 2015-04-24 NOTE — Telephone Encounter (Signed)
Requesting refill of Fioricet and tramadol

## 2015-04-30 ENCOUNTER — Other Ambulatory Visit: Payer: Self-pay | Admitting: Hematology and Oncology

## 2015-04-30 ENCOUNTER — Ambulatory Visit: Payer: Medicaid Other

## 2015-04-30 DIAGNOSIS — R1310 Dysphagia, unspecified: Secondary | ICD-10-CM | POA: Insufficient documentation

## 2015-05-01 ENCOUNTER — Telehealth: Payer: Self-pay | Admitting: *Deleted

## 2015-05-01 ENCOUNTER — Other Ambulatory Visit (HOSPITAL_COMMUNITY): Payer: Self-pay | Admitting: Hematology and Oncology

## 2015-05-01 ENCOUNTER — Other Ambulatory Visit: Payer: Self-pay | Admitting: Hematology and Oncology

## 2015-05-01 DIAGNOSIS — R1310 Dysphagia, unspecified: Secondary | ICD-10-CM

## 2015-05-01 NOTE — Telephone Encounter (Signed)
[facility] called with the following request.   I saw my PCP who ran lab: cbc and Cmet. Results will be sent to Dr. Alvy Bimler.    Update Cameo, I received a call from [facility]  Believe I need the modified barium swallow before they evaluate swelling.  Cameo was working on this and he may call her.  Barium swallow scheduled for 05-06-2015 at 11:00 am.   I learned my condition is considered terminal so I will no longer qualify for Medicaid and will have Medicare.  Leaving me with an insurance gap in coverage.  Medicaid ends 05-13-2015.  Process takes 45 to 90 days to switch.  I need medication management with refills on MSIR 15 mg, Tramadol 50 mg and Fioricet to cover gap in care due to insurance.  Do I need Appointment with Dr. Alvy Bimler after she receives labs from PCP or Barium swallow results on  if she feels I need f/u after review of labs PCP drew and results of barium swallow.  No F/u scheduled.  Return number [phone]. Marland Kitchen

## 2015-05-01 NOTE — Telephone Encounter (Signed)
              Dr. Alvy Bimler replied 1. She has not heard anything from PCP.  2. Asks Nurse to check w/ managed cared dept to see it they can help w/ insurance coverage? 3. She plans to see pt on 10/24 and refill medications at that time.  4.  She asks when pt is going back to [facility] and what is the recommendation from [facility]?  Left VM for pt informing her Dr. Alvy Bimler has received pt's message.  She wants to see her on 10/24 at 2 pm and will do refills at that time.  Please call us back if need any refills sooner.  I also asked her about [facility].   Will f/u w/ Managed care dept on Monday 10/17

## 2015-05-06 ENCOUNTER — Ambulatory Visit (HOSPITAL_COMMUNITY)
Admission: RE | Admit: 2015-05-06 | Discharge: 2015-05-06 | Disposition: A | Discharge status: Home / Self Care | Payer: Medicaid Other | Source: Ambulatory Visit | Attending: Hematology and Oncology | Admitting: Hematology and Oncology

## 2015-05-06 ENCOUNTER — Telehealth: Payer: Self-pay | Admitting: *Deleted

## 2015-05-06 ENCOUNTER — Other Ambulatory Visit: Payer: Self-pay | Admitting: Hematology and Oncology

## 2015-05-06 DIAGNOSIS — R1310 Dysphagia, unspecified: Secondary | ICD-10-CM

## 2015-05-06 MED ORDER — MORPHINE SULFATE 15 MG PO TABS: 15.0000 mg | ORAL_TABLET | Freq: Four times a day (QID) | ORAL | Status: DC | PRN

## 2015-05-06 NOTE — Telephone Encounter (Signed)
Pt states she needs a refill on MS 15 mg. Is taking 2 tablets at a time, but does not take it everyday. Is not using Tramadol as she thinks it is making her seizure activity worse.

## 2015-05-11 ENCOUNTER — Ambulatory Visit (HOSPITAL_BASED_OUTPATIENT_CLINIC_OR_DEPARTMENT_OTHER): Payer: Medicaid Other | Admitting: Hematology and Oncology

## 2015-05-11 VITALS — BP 131/84 | HR 96 | Temp 98.1°F | Resp 17 | Ht 69.0 in | Wt 169.8 lb

## 2015-05-11 DIAGNOSIS — R1310 Dysphagia, unspecified: Secondary | ICD-10-CM | POA: Diagnosis not present

## 2015-05-11 DIAGNOSIS — M792 Neuralgia and neuritis, unspecified: Secondary | ICD-10-CM | POA: Diagnosis not present

## 2015-05-11 DIAGNOSIS — C711 Malignant neoplasm of frontal lobe: Secondary | ICD-10-CM

## 2015-05-11 DIAGNOSIS — G44021 Chronic cluster headache, intractable: Secondary | ICD-10-CM

## 2015-05-11 DIAGNOSIS — G40109 Localization-related (focal) (partial) symptomatic epilepsy and epileptic syndromes with simple partial seizures, not intractable, without status epilepticus: Secondary | ICD-10-CM

## 2015-05-11 MED ORDER — BUTALBITAL-APAP-CAFFEINE 50-325-40 MG PO TABS: ORAL_TABLET | ORAL | Status: DC

## 2015-05-11 MED ORDER — HYDROCODONE-ACETAMINOPHEN 10-325 MG PO TABS: 1.0000 | ORAL_TABLET | Freq: Four times a day (QID) | ORAL | Status: DC | PRN

## 2015-05-13 ENCOUNTER — Encounter: Payer: Self-pay | Admitting: Hematology and Oncology

## 2015-05-13 NOTE — Assessment & Plan Note (Signed)
We have modified her medications extensively over the past few months. She was successfully taper off steroids. She also discontinue long-acting morphine sulfate. She was attempting to wean herself off immediate release morphine but was unable to do so. She does not do well with tramadol. She wants to try Vicodin. I gave her prescription. She would also like refill on Fioricet for now.

## 2015-05-13 NOTE — Assessment & Plan Note (Signed)
She had recent MRI at [facility] which was ascribed as stable. Unfortunately, she is describing more neurological deficits including poorly-controlled headaches and persistent cough with dysphagia. She has obtained another opinion at [facility] and met with a neurosurgeon who felt that surgical resection is indicated. She had questions regarding the benefits of general anesthesia versus other forms of anesthesia whereby she will be operated while still awake. I recommend she discuss options with her surgeon to get an informed decision before she proceed with surgery.

## 2015-05-13 NOTE — Assessment & Plan Note (Signed)
She had significant dysphagia and coughing. She underwent modified barium swallow recently and GI evaluation was recommended. She is wondering whether this could be related to her brain lesion. It is certainly possible but I would still recommend GI evaluation to exclude local problem as a cause of her cough and dysphagia. I will refer her to GI service for further management. She is concerned about her insurance status but hopefully would resolve soon so that she could be seen

## 2015-05-13 NOTE — Assessment & Plan Note (Signed)
She felt that she has small seizure since she started tramadol We have discontinued tramadol and she will continue follow-up with her neurologist for medication adjustment.

## 2015-05-13 NOTE — Progress Notes (Signed)
[place] OFFICE PROGRESS NOTE  Patient Care Team: Lavone Orn, MD as PCP - General (Internal Medicine) Ashok Pall, MD as Consulting Physician (Neurosurgery) Burnell Blanks. Salomon Fick, MD as Consulting Physician (Neurosurgery) Burnell Blanks. 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   09/27/2013 Pathology Results Accession: [id number] pathology showed grade 2 oligodendroglioma with mutant IDH1 protein, ATRX, OLIG 2 expression. p53 is negative, low Ki-67 labeling index. 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   11/06/2013 Imaging Repeat CT scan of the head showed resolution of abscess.   01/23/2014 Surgery Dr. Christella Noa perform a subtotal gross resection of the tumor.   01/24/2014 Imaging Repeat imaging studies showed persistent and residual disease.   03/03/2014 - 09/01/2014 Chemotherapy She was started on Temodar, 5 days every 28 days   03/13/2014 Imaging Repeat MRI showed no evidence of progression of disease.   07/14/2014 Imaging Repeat MRI showed no evidence of disease progression.   09/12/2014 Imaging MRI of the liver show mild regression of the size of lesions, presumed to be benign   09/15/2014 Imaging Repeat MRI of the brain show persistent abnormalities, suspicious for residual disease   03/02/2015 Imaging  MRI brain at [facility] show persistent abnormalities    INTERVAL HISTORY: Please see below for problem oriented charting. She returns today for further evaluation. She went back to [facility] last week for further management. According to her, MRI brain was stable. She is starting to have significant dysphagia and was evaluated with modified barium swallow recently. The seizure was poorly controlled  when she tried to switch over to tramadol. Since then, we discontinued Tramadol and switch back to IR morphine for pain control. She went to [facility] recently for another opinion for possible surgery in the new future.  REVIEW OF SYSTEMS:   Constitutional: Denies fevers, chills or abnormal weight loss Eyes: Denies blurriness of vision Ears, nose, mouth, throat, and face: Denies mucositis or sore throat Respiratory: Denies cough, dyspnea or wheezes Cardiovascular: Denies palpitation, chest discomfort or lower extremity swelling Gastrointestinal:  Denies nausea, heartburn or change in bowel habits Skin: Denies abnormal skin rashes Lymphatics: Denies new lymphadenopathy or easy bruising Behavioral/Psych: Mood is stable, no new changes  All other systems were reviewed with the patient and are negative.  I have reviewed the past medical history, past surgical history, social history and family history with the patient and they are unchanged from previous note.  ALLERGIES:  is allergic to adhesive; latex; percocet; shellfish allergy; soma; tramadol hcl; and vancomycin.  MEDICATIONS:  Current Outpatient Prescriptions  Medication Sig Dispense Refill  . albuterol (PROVENTIL HFA;VENTOLIN HFA) 108 (90 BASE) MCG/ACT inhaler Inhale 2 puffs into the lungs every 6 (six) hours as needed for wheezing or shortness of breath.     . ALPRAZolam (XANAX) 1 MG tablet Take 1 mg by mouth 2 (two) times daily as needed (for seizures).     . butalbital-acetaminophen-caffeine (FIORICET, ESGIC) 50-325-40 MG tablet TAKE 1 TABLET BY MOUTH EVERY 6 HOURS AS NEEDED FOR HEADACHE 90 tablet 0  . carisoprodol (SOMA) 350 MG tablet Take 1 tablet (350 mg total) by mouth 4 (four) times daily. For seizure prevention 90 tablet 5  . cetirizine (ZYRTEC) 10 MG tablet Take 10 mg by mouth daily.    Marland Kitchen  dicyclomine (BENTYL) 20 MG tablet Take 20 mg by mouth at bedtime as needed and may repeat dose one time if needed for spasms.    .  divalproex (DEPAKOTE ER) 250 MG 24 hr tablet Take 2 tablets every am and 3 tablets every pm 450 tablet 1  . gabapentin (NEURONTIN) 300 MG capsule Take 3 capsules in the morning and 3 capsules in the evening 540 capsule 1  . morphine (MSIR) 15 MG tablet Take 1 tablet (15 mg total) by mouth every 6 (six) hours as needed for severe pain. 90 tablet 0  . HYDROcodone-acetaminophen (NORCO) 10-325 MG tablet Take 1 tablet by mouth every 6 (six) hours as needed. 90 tablet 0   No current facility-administered medications for this visit.    PHYSICAL EXAMINATION: ECOG PERFORMANCE STATUS: 1 - Symptomatic but completely ambulatory  Filed Vitals:   05/11/15 1421  BP: 131/84  Pulse: 96  Temp: 98.1 F (36.7 C)  Resp: 17   Filed Weights   05/11/15 1421  Weight: 169 lb 12.8 oz (77.021 kg)    GENERAL:alert, no distress and comfortable. She has significant dry cough SKIN: skin color, texture, turgor are normal, no rashes or significant lesions EYES: normal, Conjunctiva are pink and non-injected, sclera clear OROPHARYNX:no exudate, no erythema and lips, buccal mucosa, and tongue normal  NEURO: alert & oriented x 3 with fluent speech  LABORATORY DATA:  I have reviewed the data as listed    Component Value Date/Time   NA 139 02/16/2015 1310   NA 138 01/27/2014 0611   K 4.5 02/16/2015 1310   K 4.4 01/27/2014 0611   CL 99 01/27/2014 0611   CO2 28 02/16/2015 1310   CO2 26 01/27/2014 0611   GLUCOSE 95 02/16/2015 1310   GLUCOSE 115* 01/27/2014 0611   BUN 9.2 02/16/2015 1310   BUN 8 01/27/2014 0611   CREATININE 0.6 02/16/2015 1310   CREATININE 0.48* 01/27/2014 0611   CALCIUM 9.1 02/16/2015 1310   CALCIUM 9.0 01/27/2014 0611   PROT 6.7 02/16/2015 1310   PROT 6.9 09/20/2013 0455   ALBUMIN 3.8 02/16/2015 1310   ALBUMIN 3.9 09/20/2013 0455   AST 19 02/16/2015 1310   AST 12 09/20/2013 0455   ALT 21 02/16/2015 1310   ALT 8 09/20/2013 0455   ALKPHOS 44 02/16/2015 1310   ALKPHOS 40 09/20/2013 0455    BILITOT 0.40 02/16/2015 1310   BILITOT 0.7 09/20/2013 0455   GFRNONAA >90 01/27/2014 0611   GFRAA >90 01/27/2014 0611    No results found for: SPEP, UPEP  Lab Results  Component Value Date   WBC 4.9 02/16/2015   NEUTROABS 2.7 02/16/2015   HGB 13.4 02/16/2015   HCT 39.6 02/16/2015   MCV 93.2 02/16/2015   PLT 217 02/16/2015      Chemistry      Component Value Date/Time   NA 139 02/16/2015 1310   NA 138 01/27/2014 0611   K 4.5 02/16/2015 1310   K 4.4 01/27/2014 0611   CL 99 01/27/2014 0611   CO2 28 02/16/2015 1310   CO2 26 01/27/2014 0611   BUN 9.2 02/16/2015 1310   BUN 8 01/27/2014 0611   CREATININE 0.6 02/16/2015 1310   CREATININE 0.48* 01/27/2014 0611      Component Value Date/Time   CALCIUM 9.1 02/16/2015 1310   CALCIUM 9.0 01/27/2014 0611   ALKPHOS 44 02/16/2015 1310   ALKPHOS 40 09/20/2013 0455   AST 19 02/16/2015 1310   AST 12 09/20/2013 0455  ALT 21 02/16/2015 1310   ALT 8 09/20/2013 0455   BILITOT 0.40 02/16/2015 1310   BILITOT 0.7 09/20/2013 0455     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 Unfortunately, she is describing more neurological deficits including poorly-controlled headaches and persistent cough with dysphagia. She has obtained another opinion at [facility] and met with a neurosurgeon who felt that surgical resection is indicated. She had questions regarding the benefits of general anesthesia versus other forms of anesthesia whereby she will be operated while still awake. I recommend she discuss options with her surgeon to get an informed decision before she proceed with surgery.  Simple partial seizures She felt that she has small seizure since she started tramadol We have discontinued tramadol and she will continue follow-up with her neurologist for medication adjustment.  Intractable chronic  cluster headache We have modified her medications extensively over the past few months. She was successfully taper off steroids. She also discontinue long-acting morphine sulfate. She was attempting to wean herself off immediate release morphine but was unable to do so. She does not do well with tramadol. She wants to try Vicodin. I gave her prescription. She would also like refill on Fioricet for now.  Dysphagia, idiopathic She had significant dysphagia and coughing. She underwent modified barium swallow recently and GI evaluation was recommended. She is wondering whether this could be related to her brain lesion. It is certainly possible but I would still recommend GI evaluation to exclude local problem as a cause of her cough and dysphagia. I will refer her to GI service for further management. She is concerned about her insurance status but hopefully would resolve soon so that she could be seen   Orders Placed This Encounter  Procedures  . Ambulatory referral to Gastroenterology    Referral Priority:  Routine    Referral Type:  Consultation    Referral Reason:  Specialty Services Required    Number of Visits Requested:  1   All questions were answered. The patient knows to call the clinic with any problems, questions or concerns. No barriers to learning was detected. I spent 25 minutes counseling the patient face to face. The total time spent in the appointment was 30 minutes and more than 50% was on counseling and review of test results     [facility], [place], MD 05/13/2015 9:35 AM

## 2015-05-15 ENCOUNTER — Encounter: Payer: Self-pay | Admitting: *Deleted

## 2015-05-15 NOTE — Progress Notes (Signed)
[place] Work  Clinical Social Work was referred by Futures trader for assessment of psychosocial needs.  Clinical Social Worker attempted to contact patient at home to offer support and assess for needs. CSW left message introducing self, checking in  and shared info about upcoming Brain Tumor Support group for next week. CSW awaits return call.      Loren Racer, [place] Worker [place]  [place] Phone: [phone] Fax: [phone]

## 2015-05-20 ENCOUNTER — Telehealth: Payer: Self-pay | Admitting: *Deleted

## 2015-05-20 NOTE — Telephone Encounter (Signed)
Victoria Bates called with "two request.  Victoria Bates going to [place] to visit family Friday November 4th at 3:00 through November 13th.  My mom's planned a visit to [facility].  1. I'd like a letter/note that reads my seizures are exacerbated by standing and heat exposure if I over-exert myself.  My husband is adamant about me getting a wheelchair for Victoria Bates is why I'm asking for letter.   2. I have refills left on the Soma but will be four days early refilling.  What do I need to do to get this filled?   Need to pick up letter before/by 3:00 pm Friday.  Return number [phone].  This nurse advised she use a battery operated mist fan to help stay cool and hydrate well.  [employer] will work with her for whatever she needs.  The Pharmacy will call if she is too early for this refill because insurance won't cover.  Will notify Dr. Alvy Bimler of these requests.

## 2015-05-22 ENCOUNTER — Encounter: Payer: Self-pay | Admitting: *Deleted

## 2015-05-22 NOTE — Telephone Encounter (Signed)
PLs help with the letter

## 2015-05-22 NOTE — Telephone Encounter (Signed)
Received call from pt about her refill on her Soma.  She states that the pharmacy won't fill it without [place] calling them.  She states that the script is due refill on Monday & she is leaving today to go out of town & will be gone all next week.   Called pharmacy & OK'd early refill on Soma with Pharmacist/Natalie.

## 2015-06-04 ENCOUNTER — Telehealth: Payer: Self-pay | Admitting: *Deleted

## 2015-06-04 NOTE — Telephone Encounter (Signed)
Pt called requesting refill of Fioricet now.  Pt also stated she would need refill of Hydrocodone early next week.  Pt wanted to know if Dr. Alvy Bimler would refill both of these meds this week. Pt's   Phone    [phone].

## 2015-06-05 ENCOUNTER — Telehealth: Payer: Self-pay | Admitting: *Deleted

## 2015-06-05 ENCOUNTER — Other Ambulatory Visit: Payer: Self-pay | Admitting: Hematology and Oncology

## 2015-06-05 DIAGNOSIS — M792 Neuralgia and neuritis, unspecified: Secondary | ICD-10-CM

## 2015-06-05 DIAGNOSIS — G44021 Chronic cluster headache, intractable: Secondary | ICD-10-CM

## 2015-06-05 MED ORDER — BUTALBITAL-APAP-CAFFEINE 50-325-40 MG PO TABS: ORAL_TABLET | ORAL | Status: DC

## 2015-06-05 MED ORDER — HYDROCODONE-ACETAMINOPHEN 10-325 MG PO TABS: 1.0000 | ORAL_TABLET | Freq: Four times a day (QID) | ORAL | Status: DC | PRN

## 2015-06-05 NOTE — Telephone Encounter (Signed)
Ready for pick up

## 2015-06-05 NOTE — Telephone Encounter (Signed)
Called pt at home and left message on voice mail re:  Scripts are ready to pick up.

## 2015-06-05 NOTE — Telephone Encounter (Signed)
LM that refills are ready for pick up

## 2015-06-17 ENCOUNTER — Telehealth: Payer: Self-pay | Admitting: *Deleted

## 2015-06-17 NOTE — Telephone Encounter (Signed)
"   I need a refill on the short acting Morphine sulfate.  It's not an emergency but I do not want to run out.  Could you tell them to call my husband [name] at [phone] when prescription ready for pick up.  I am not driving."

## 2015-06-18 ENCOUNTER — Other Ambulatory Visit: Payer: Self-pay | Admitting: Hematology and Oncology

## 2015-06-18 MED ORDER — MORPHINE SULFATE 15 MG PO TABS
15.0000 mg | ORAL_TABLET | Freq: Four times a day (QID) | ORAL | Status: DC | PRN
Start: 2015-06-18 — End: 2015-07-21

## 2015-06-18 NOTE — Telephone Encounter (Signed)
Prescription ready

## 2015-06-18 NOTE — Telephone Encounter (Signed)
Informed husband of Rx ready to pick up.

## 2015-06-26 ENCOUNTER — Other Ambulatory Visit: Payer: Self-pay | Admitting: Hematology and Oncology

## 2015-06-26 ENCOUNTER — Other Ambulatory Visit: Payer: Self-pay | Admitting: *Deleted

## 2015-06-26 DIAGNOSIS — G44021 Chronic cluster headache, intractable: Secondary | ICD-10-CM

## 2015-06-26 DIAGNOSIS — M792 Neuralgia and neuritis, unspecified: Secondary | ICD-10-CM

## 2015-06-26 MED ORDER — BUTALBITAL-APAP-CAFFEINE 50-325-40 MG PO TABS: ORAL_TABLET | ORAL | Status: DC

## 2015-07-02 ENCOUNTER — Other Ambulatory Visit: Payer: Self-pay | Admitting: Hematology and Oncology

## 2015-07-02 ENCOUNTER — Other Ambulatory Visit: Payer: Self-pay | Admitting: *Deleted

## 2015-07-02 MED ORDER — HYDROCODONE-ACETAMINOPHEN 10-325 MG PO TABS: 1.0000 | ORAL_TABLET | Freq: Four times a day (QID) | ORAL | Status: DC | PRN

## 2015-07-02 NOTE — Telephone Encounter (Signed)
Ready for pick up

## 2015-07-02 NOTE — Telephone Encounter (Signed)
VM message from patient requesting refill on her Hydrocodone 10/325, lasted filled on 06/05/15.  Please call her husband, Victoria Bates @ [phone] when prescription is ready for pick up.   Victoria Bates is unable to drive at this time.

## 2015-07-02 NOTE — Telephone Encounter (Signed)
Spouse called asking if Hydrocodone requested earlier today is ready for pick up.  Confirmed with collaborative.  Informed him and he is on the way to pick up script.

## 2015-07-15 ENCOUNTER — Other Ambulatory Visit: Payer: Self-pay | Admitting: *Deleted

## 2015-07-15 MED ORDER — BUTALBITAL-APAP-CAFFEINE 50-325-40 MG PO TABS: ORAL_TABLET | ORAL | Status: DC

## 2015-07-15 NOTE — Telephone Encounter (Signed)
Fioricet refill called into [facility]. Pt notified

## 2015-07-21 ENCOUNTER — Telehealth: Payer: Self-pay | Admitting: *Deleted

## 2015-07-21 DIAGNOSIS — R51 Headache: Secondary | ICD-10-CM

## 2015-07-21 DIAGNOSIS — R4702 Dysphasia: Secondary | ICD-10-CM

## 2015-07-21 DIAGNOSIS — R110 Nausea: Secondary | ICD-10-CM

## 2015-07-21 DIAGNOSIS — R202 Paresthesia of skin: Secondary | ICD-10-CM

## 2015-07-21 DIAGNOSIS — R519 Headache, unspecified: Secondary | ICD-10-CM

## 2015-07-21 DIAGNOSIS — R0789 Other chest pain: Secondary | ICD-10-CM

## 2015-07-21 DIAGNOSIS — C711 Malignant neoplasm of frontal lobe: Secondary | ICD-10-CM

## 2015-07-21 MED ORDER — MORPHINE SULFATE 15 MG PO TABS: 15.0000 mg | ORAL_TABLET | Freq: Four times a day (QID) | ORAL | Status: DC | PRN

## 2015-07-21 MED ORDER — CARISOPRODOL 350 MG PO TABS: 350.0000 mg | ORAL_TABLET | Freq: Four times a day (QID) | ORAL | Status: DC

## 2015-07-21 NOTE — Telephone Encounter (Signed)
Pt left VM needs refill on MS IR and Soma.   Refills ready to pick up.  LVM for husband, [name], Rxs ready to pick up.

## 2015-07-23 ENCOUNTER — Other Ambulatory Visit: Payer: Self-pay | Admitting: Hematology and Oncology

## 2015-07-23 ENCOUNTER — Telehealth: Payer: Self-pay | Admitting: *Deleted

## 2015-07-23 DIAGNOSIS — C719 Malignant neoplasm of brain, unspecified: Secondary | ICD-10-CM

## 2015-07-23 NOTE — Telephone Encounter (Signed)
I have placed POF to see her Monday I will not check B3 level as this will not be covered by insurance and is not indicated  B3 would only be covered for patients will malnutrition

## 2015-07-23 NOTE — Telephone Encounter (Signed)
"  I don't know whether to call Dr. Alvy Bimler or PCP.  I have a debilitating migraine that started before Christmas behind my right eye and temple.  It's constant with no relief.  When I get to the point I can't function, pain goes down back of my neck.  Not able to eat a lot.  Weaned off morphine taking ibuprofen.  I do not know if she needs to see me and then draw labs or draw labs and see me.  I would like a cbc-diff, CMP and a B3 level checked.  I have a lot of metal in my head so I thought this was the cause because it's worse with the cold air."  Return number [phone].

## 2015-07-24 ENCOUNTER — Telehealth: Payer: Self-pay | Admitting: Hematology and Oncology

## 2015-07-24 ENCOUNTER — Telehealth: Payer: Self-pay | Admitting: *Deleted

## 2015-07-24 NOTE — Telephone Encounter (Signed)
per pof to sch pt appt-cld pt and pt stated she spoke w/[facility] and got the appt time &date

## 2015-07-24 NOTE — Telephone Encounter (Signed)
Called patient to notify her to expect a call from scheduler.  Voicemail left.

## 2015-07-24 NOTE — Telephone Encounter (Signed)
P.O.F. With urgent message sent to facilitate scheduling.

## 2015-07-24 NOTE — Telephone Encounter (Signed)
Informed pt she will be scheduled to see Dr. Alvy Bimler on Monday at 11:30 am.   Please come a little before 11:00 am for lab.   She verbalized understanding.

## 2015-07-27 ENCOUNTER — Ambulatory Visit: Payer: Medicaid Other | Admitting: Hematology and Oncology

## 2015-07-27 ENCOUNTER — Other Ambulatory Visit: Payer: Medicaid Other

## 2015-07-28 ENCOUNTER — Telehealth: Payer: Self-pay | Admitting: *Deleted

## 2015-07-28 NOTE — Telephone Encounter (Signed)
Pt says she thinks school will be closed tomorrow as well.   Informed her of next appts available Tues 1/17.  She will come next week on 1/17 at 9:45 am lab and 10:15 am for Dr. Alvy Bimler.  POF sent.

## 2015-07-28 NOTE — Telephone Encounter (Signed)
LVM for pt to try to r/s her to see Dr. Alvy Bimler tomorrow.  She missed her appt yesterday due to bad weather and her children being out of school.  Appt available tomorrow pt can come in at 12 for lab/ 12:30 pm to see Dr. Alvy Bimler or come at 12:30 pm for lab and 1 pm to see Dr. Alvy Bimler.   Asked her to please return nurse's call to let us know when she can come.

## 2015-07-29 ENCOUNTER — Telehealth: Payer: Self-pay | Admitting: Hematology and Oncology

## 2015-07-29 NOTE — Telephone Encounter (Signed)
Appointments made per pof and per pof patient is aware °

## 2015-07-31 ENCOUNTER — Telehealth: Payer: Self-pay | Admitting: *Deleted

## 2015-07-31 NOTE — Telephone Encounter (Signed)
I think it is best to wait until after MRi to see her I can see her on 1/24 at 1030 or 1130; if OK with her please place new POF to change appt Concerning migraines, it is possible either related to her disease or withdrawal from pain medications I am not sure whether PCP can help but most certainly best to treat it with OTC motrin/fioricet for now

## 2015-07-31 NOTE — Telephone Encounter (Signed)
Pt left VM about her appt next week, Tuesday 1/17 w/ Dr. Alvy Bimler.  State she has "scan" at [facility] on 1/23.  Although she would like to see Dr. Alvy Bimler regarding her migraines, she thinks it might be best to wait until after her Scan at [facility].  Says she is supposed to make a decision about having surgery at [facility] after she finds out results of her scan.   She asks if Dr. Alvy Bimler thinks she should wait to see her til after the 23 rd?  She says maybe she can ask her PCP to do lab work to help find out why she is having migraines?

## 2015-07-31 NOTE — Telephone Encounter (Signed)
LVM for pt informing her of Dr. Calton Dach message below.  Asked her to return nurse's call on Monday.

## 2015-08-03 ENCOUNTER — Telehealth: Payer: Self-pay | Admitting: Hematology and Oncology

## 2015-08-03 ENCOUNTER — Other Ambulatory Visit: Payer: Self-pay | Admitting: Hematology and Oncology

## 2015-08-03 ENCOUNTER — Telehealth: Payer: Self-pay | Admitting: *Deleted

## 2015-08-03 MED ORDER — HYDROCODONE-ACETAMINOPHEN 10-325 MG PO TABS: 1.0000 | ORAL_TABLET | Freq: Four times a day (QID) | ORAL | Status: DC | PRN

## 2015-08-03 NOTE — Telephone Encounter (Signed)
Pt states ok to r/s appt to 1/24 at 10 am for lab/ 10:30 am for Dr. Alvy Bimler.  POF sent.  She will have MRI at [facility] on 1/23.   She does need refill on Vicodin before next appt.  She states she is trying to take this for Migraines instead of the MS IR.  But takes MS IR when the Vicodin doesn't work.   Informed pt Rx ready to pick up.

## 2015-08-03 NOTE — Telephone Encounter (Signed)
per pof to sch pt appt-per pof pt aware °

## 2015-08-04 ENCOUNTER — Ambulatory Visit: Payer: Medicaid Other | Admitting: Hematology and Oncology

## 2015-08-04 ENCOUNTER — Other Ambulatory Visit: Payer: Medicaid Other

## 2015-08-07 ENCOUNTER — Other Ambulatory Visit: Payer: Self-pay | Admitting: *Deleted

## 2015-08-07 MED ORDER — BUTALBITAL-APAP-CAFFEINE 50-325-40 MG PO TABS: ORAL_TABLET | ORAL | Status: DC

## 2015-08-07 NOTE — Telephone Encounter (Signed)
Called husband to notify of Rx for Fioricet ready to pick up.  He asked if nurse can call it into pharmacy,  So [facility]

## 2015-08-10 ENCOUNTER — Telehealth: Payer: Self-pay | Admitting: *Deleted

## 2015-08-10 ENCOUNTER — Ambulatory Visit: Payer: Medicaid Other | Admitting: Neurology

## 2015-08-10 NOTE — Telephone Encounter (Signed)
Pt left message stating she has been to [facility] and they suggested checking her vitamin D and vitamin B levels to see if she is deficient.

## 2015-08-11 ENCOUNTER — Ambulatory Visit (HOSPITAL_BASED_OUTPATIENT_CLINIC_OR_DEPARTMENT_OTHER): Payer: Medicaid Other | Admitting: Hematology and Oncology

## 2015-08-11 ENCOUNTER — Other Ambulatory Visit (HOSPITAL_BASED_OUTPATIENT_CLINIC_OR_DEPARTMENT_OTHER): Payer: Medicaid Other

## 2015-08-11 VITALS — BP 127/72 | HR 98 | Temp 98.1°F | Resp 18 | Wt 171.8 lb

## 2015-08-11 DIAGNOSIS — R4189 Other symptoms and signs involving cognitive functions and awareness: Secondary | ICD-10-CM

## 2015-08-11 DIAGNOSIS — C711 Malignant neoplasm of frontal lobe: Secondary | ICD-10-CM

## 2015-08-11 DIAGNOSIS — F418 Other specified anxiety disorders: Secondary | ICD-10-CM

## 2015-08-11 DIAGNOSIS — G44021 Chronic cluster headache, intractable: Secondary | ICD-10-CM | POA: Diagnosis not present

## 2015-08-11 DIAGNOSIS — R530 Neoplastic (malignant) related fatigue: Secondary | ICD-10-CM | POA: Diagnosis not present

## 2015-08-11 DIAGNOSIS — G40109 Localization-related (focal) (partial) symptomatic epilepsy and epileptic syndromes with simple partial seizures, not intractable, without status epilepticus: Secondary | ICD-10-CM

## 2015-08-11 DIAGNOSIS — C719 Malignant neoplasm of brain, unspecified: Secondary | ICD-10-CM

## 2015-08-11 LAB — COMPREHENSIVE METABOLIC PANEL
ALT: 12 U/L (ref 0–55)
AST: 14 U/L (ref 5–34)
Albumin: 4.3 g/dL (ref 3.5–5.0)
Alkaline Phosphatase: 45 U/L (ref 40–150)
Anion Gap: 7 mEq/L (ref 3–11)
BUN: 6.2 mg/dL — ABNORMAL LOW (ref 7.0–26.0)
CO2: 27 mEq/L (ref 22–29)
Calcium: 9.4 mg/dL (ref 8.4–10.4)
Chloride: 107 mEq/L (ref 98–109)
Creatinine: 0.7 mg/dL (ref 0.6–1.1)
EGFR: >90 mL/min/{1.73_m2} (ref >90)
Glucose: 108 mg/dl (ref 70–140)
Potassium: 4.9 mEq/L (ref 3.5–5.1)
Sodium: 142 mEq/L (ref 136–145)
Total Bilirubin: 0.35 mg/dL (ref 0.20–1.20)
Total Protein: 7.3 g/dL (ref 6.4–8.3)

## 2015-08-11 LAB — CBC WITH DIFFERENTIAL/PLATELET
BASO%: 0.7 % (ref 0.0–2.0)
Basophils Absolute: 0 10*3/uL (ref 0.0–0.1)
EOS%: 0.4 % (ref 0.0–7.0)
Eosinophils Absolute: 0 10*3/uL (ref 0.0–0.5)
HCT: 39.5 % (ref 34.8–46.6)
HGB: 13.3 g/dL (ref 11.6–15.9)
LYMPH%: 21.6 % (ref 14.0–49.7)
MCH: 31.5 pg (ref 25.1–34.0)
MCHC: 33.5 g/dL (ref 31.5–36.0)
MCV: 93.9 fL (ref 79.5–101.0)
MONO#: 0.2 10*3/uL (ref 0.1–0.9)
MONO%: 5 % (ref 0.0–14.0)
NEUT#: 3.4 10*3/uL (ref 1.5–6.5)
NEUT%: 72.3 % (ref 38.4–76.8)
Platelets: 218 10*3/uL (ref 145–400)
RBC: 4.2 10*6/uL (ref 3.70–5.45)
RDW: 11.7 % (ref 11.2–14.5)
WBC: 4.8 10*3/uL (ref 3.9–10.3)
lymph#: 1 10*3/uL (ref 0.9–3.3)

## 2015-08-11 NOTE — Progress Notes (Signed)
[facility] OFFICE PROGRESS NOTE  Patient Care Team: Lavone Orn, MD as PCP - General (Internal Medicine) Ashok Pall, MD as Consulting Physician (Neurosurgery) Burnell Blanks. Salomon Fick, MD as Consulting Physician (Neurosurgery) Burnell Blanks. 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   09/27/2013 Pathology Results Accession: [id number] pathology showed grade 2 oligodendroglioma with mutant IDH1 protein, ATRX, OLIG 2 expression. p53 is negative, low Ki-67 labeling index. 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   11/06/2013 Imaging Repeat CT scan of the head showed resolution of abscess.   01/23/2014 Surgery Dr. Christella Noa perform a subtotal gross resection of the tumor.   01/24/2014 Imaging Repeat imaging studies showed persistent and residual disease.   03/03/2014 - 09/01/2014 Chemotherapy She was started on Temodar, 5 days every 28 days   03/13/2014 Imaging Repeat MRI showed no evidence of progression of disease.   07/14/2014 Imaging Repeat MRI showed no evidence of disease progression.   09/12/2014 Imaging MRI of the liver show mild regression of the size of lesions, presumed to be benign   09/15/2014 Imaging Repeat MRI of the brain show persistent abnormalities, suspicious for residual disease   03/02/2015 Imaging  MRI brain at [facility] show persistent abnormalities    INTERVAL HISTORY: Please see below for problem oriented charting. I spent majority of the time today talking to the patient and counseling her. Recently, with the cold weather, she has significant exacerbation of her severe headache over the right temporal area. She felt that pain medicine did not help. She tried a warm compress and wearing  a really thick hat and that seemed to help her pain significantly. She denies new neurological deficits or worsening seizures. She is coping well but continues to complain of significant fatigue. She continues to have poor short-term memory. She denies recent loss of appetite. She denies depression or suicidal ideation. She is still going back and forth regarding possible surgical resection versus chemotherapy  REVIEW OF SYSTEMS:   Constitutional: Denies fevers, chills or abnormal weight loss Eyes: Denies blurriness of vision Ears, nose, mouth, throat, and face: Denies mucositis or sore throat Respiratory: Denies cough, dyspnea or wheezes Cardiovascular: Denies palpitation, chest discomfort or lower extremity swelling Gastrointestinal:  Denies nausea, heartburn or change in bowel habits Skin: Denies abnormal skin rashes Lymphatics: Denies new lymphadenopathy or easy bruising Neurological:Denies numbness, tingling or new weaknesses Behavioral/Psych: Mood is stable, no new changes  All other systems were reviewed with the patient and are negative.  I have reviewed the past medical history, past surgical history, social history and family history with the patient and they are unchanged from previous note.  ALLERGIES:  is allergic to adhesive; latex; percocet; shellfish allergy; soma; tramadol hcl; and vancomycin.  MEDICATIONS:  Current Outpatient Prescriptions  Medication Sig Dispense Refill  . butalbital-acetaminophen-caffeine (FIORICET, ESGIC) 50-325-40 MG tablet TAKE 1 TABLET BY MOUTH EVERY 6 HOURS AS NEEDED FOR HEADACE. 90 tablet 0  . carisoprodol (SOMA) 350 MG tablet Take 1 tablet (350 mg total) by mouth 4 (four) times daily. For seizure prevention 90 tablet 5  . cetirizine (ZYRTEC) 10 MG tablet Take 10 mg by mouth daily.    Marland Kitchen dicyclomine (BENTYL) 20 MG tablet Take 20 mg by mouth at bedtime as needed and may  repeat dose one time if needed for spasms.    . divalproex (DEPAKOTE ER) 250  MG 24 hr tablet Take 2 tablets every am and 3 tablets every pm 450 tablet 1  . gabapentin (NEURONTIN) 300 MG capsule Take 3 capsules in the morning and 3 capsules in the evening 540 capsule 1  . HYDROcodone-acetaminophen (NORCO) 10-325 MG tablet Take 1 tablet by mouth every 6 (six) hours as needed. 90 tablet 0  . albuterol (PROVENTIL HFA;VENTOLIN HFA) 108 (90 BASE) MCG/ACT inhaler Inhale 2 puffs into the lungs every 6 (six) hours as needed for wheezing or shortness of breath. Reported on 08/11/2015    . ALPRAZolam (XANAX) 1 MG tablet Take 1 mg by mouth 2 (two) times daily as needed (for seizures). Reported on 08/11/2015    . ibuprofen (ADVIL,MOTRIN) 600 MG tablet Take 600 mg by mouth. Reported on 08/11/2015    . morphine (MSIR) 15 MG tablet Take 1 tablet (15 mg total) by mouth every 6 (six) hours as needed for severe pain. (Patient not taking: Reported on 08/11/2015) 90 tablet 0  . zolmitriptan (ZOMIG) 5 MG tablet Reported on 08/11/2015     No current facility-administered medications for this visit.    PHYSICAL EXAMINATION: ECOG PERFORMANCE STATUS: 1 - Symptomatic but completely ambulatory  Filed Vitals:   08/11/15 1031  BP: 127/72  Pulse: 98  Temp: 98.1 F (36.7 C)  Resp: 18   Filed Weights   08/11/15 1031  Weight: 171 lb 12.8 oz (77.928 kg)    GENERAL:alert, no distress and comfortable SKIN: skin color, texture, turgor are normal, no rashes or significant lesions EYES: normal, Conjunctiva are pink and non-injected, sclera clear NEURO: alert & oriented x 3 with fluent speech, no apparent focal motor/sensory deficits  LABORATORY DATA:  I have reviewed the data as listed    Component Value Date/Time   NA 142 08/11/2015 1018   NA 138 01/27/2014 0611   K 4.9 08/11/2015 1018   K 4.4 01/27/2014 0611   CL 99 01/27/2014 0611   CO2 27 08/11/2015 1018   CO2 26 01/27/2014 0611   GLUCOSE 108 08/11/2015 1018   GLUCOSE 115* 01/27/2014 0611   BUN 6.2* 08/11/2015 1018   BUN 8 01/27/2014  0611   CREATININE 0.7 08/11/2015 1018   CREATININE 0.48* 01/27/2014 0611   CALCIUM 9.4 08/11/2015 1018   CALCIUM 9.0 01/27/2014 0611   PROT 7.3 08/11/2015 1018   PROT 6.9 09/20/2013 0455   ALBUMIN 4.3 08/11/2015 1018   ALBUMIN 3.9 09/20/2013 0455   AST 14 08/11/2015 1018   AST 12 09/20/2013 0455   ALT 12 08/11/2015 1018   ALT 8 09/20/2013 0455   ALKPHOS 45 08/11/2015 1018   ALKPHOS 40 09/20/2013 0455   BILITOT 0.35 08/11/2015 1018   BILITOT 0.7 09/20/2013 0455   GFRNONAA >90 01/27/2014 0611   GFRAA >90 01/27/2014 0611    No results found for: SPEP, UPEP  Lab Results  Component Value Date   WBC 4.8 08/11/2015   NEUTROABS 3.4 08/11/2015   HGB 13.3 08/11/2015   HCT 39.5 08/11/2015   MCV 93.9 08/11/2015   PLT 218 08/11/2015      Chemistry      Component Value Date/Time   NA 142 08/11/2015 1018   NA 138 01/27/2014 0611   K 4.9 08/11/2015 1018   K 4.4 01/27/2014 0611   CL 99 01/27/2014 0611   CO2 27 08/11/2015 1018   CO2 26 01/27/2014 0611   BUN 6.2*  08/11/2015 1018   BUN 8 01/27/2014 0611   CREATININE 0.7 08/11/2015 1018   CREATININE 0.48* 01/27/2014 0611      Component Value Date/Time   CALCIUM 9.4 08/11/2015 1018   CALCIUM 9.0 01/27/2014 0611   ALKPHOS 45 08/11/2015 1018   ALKPHOS 40 09/20/2013 0455   AST 14 08/11/2015 1018   AST 12 09/20/2013 0455   ALT 12 08/11/2015 1018   ALT 8 09/20/2013 0455   BILITOT 0.35 08/11/2015 1018   BILITOT 0.7 09/20/2013 0455       RADIOGRAPHIC STUDIES: I reveal multiple MRI imaging I have personally reviewed the radiological images as listed and agreed with the findings in the report.    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 Overall, she have stable disease. Again, we discussed the risks, benefits, side  effects of proceeding with resection at the expense of possible more disability from the neurological standpoint or palliative chemotherapy with procarbazine, CCNU and vincristine versus temozolomide. Overall, I would favor observation at this point. The patient will discuss with her husband regarding the recommendation. She is interested to return to [facility] in the future for her surveillance imaging in the future. I will refer her to neuropsychiatric service in view of her difficulties with coping, significant fatigue and memory impairment. Her physicians at [facility] are recommending her to be started on CNS stimulants which I am not comfortable prescribing in view of recurrent seizures and my lack of knowledge in terms of interactions with her antiseizure medications. I will refer her to see a local provider for further recommendation. The patient will call me back regarding future return appointment.  Simple partial seizures Overall, she has persistent, recurrent seizures which appeared to be somewhat under control with her current medications. She will continue follow-up with her neurologist for medication adjustment.    Intractable chronic cluster headache She has chronic headache which is multifactorial, related to prior resection, possibly poor sleep and recent taper of pain medicine. Recently, she also described an element of neuropathic pain which was exacerbated by the cold weather, relieved with warm compress and wearing a warm hat We will continue conservative management with her current medications.  Cognitive impairment She has poor memory after surgery and possibly from recent recurrent seizures. She appears to be coping by avoiding a lot of things down. She has chronic fatigue, anxiety and constant worry about disease progression in the future She denies depression or suicidal ideation. As above, her physician has recommended a CNS stimulant. I recommend referral to neuro  psych evaluation and management     All questions were answered. The patient knows to call the clinic with any problems, questions or concerns. No barriers to learning was detected. I spent 30 minutes counseling the patient face to face. The total time spent in the appointment was 40 minutes and more than 50% was on counseling and review of test results     [facility], [place], MD 08/11/2015 2:00 PM

## 2015-08-12 ENCOUNTER — Other Ambulatory Visit: Payer: Self-pay | Admitting: Hematology and Oncology

## 2015-08-12 ENCOUNTER — Inpatient Hospital Stay
Admission: RE | Admit: 2015-08-12 | Discharge: 2015-08-12 | Disposition: A | Discharge status: Home / Self Care | Payer: Self-pay | Source: Ambulatory Visit | Attending: Hematology and Oncology | Admitting: Hematology and Oncology

## 2015-08-12 DIAGNOSIS — C801 Malignant (primary) neoplasm, unspecified: Secondary | ICD-10-CM

## 2015-08-13 ENCOUNTER — Encounter: Payer: Self-pay | Admitting: Hematology and Oncology

## 2015-08-13 DIAGNOSIS — R4189 Other symptoms and signs involving cognitive functions and awareness: Secondary | ICD-10-CM

## 2015-08-13 HISTORY — DX: Other symptoms and signs involving cognitive functions and awareness: R41.89

## 2015-08-13 NOTE — Assessment & Plan Note (Signed)
Overall, she has persistent, recurrent seizures which appeared to be somewhat under control with her current medications. She will continue follow-up with her neurologist for medication adjustment.

## 2015-08-13 NOTE — Assessment & Plan Note (Signed)
I reveal multiple imaging study with the patient, comparing with some old imaging study and her most recent imaging from [place]. Overall, she have stable disease. Again, we discussed the risks, benefits, side effects of proceeding with resection at the expense of possible more disability from the neurological standpoint or palliative chemotherapy with procarbazine, CCNU and vincristine versus temozolomide. Overall, I would favor observation at this point. The patient will discuss with her husband regarding the recommendation. She is interested to return to [facility] in the future for her surveillance imaging in the future. I will refer her to neuropsychiatric service in view of her difficulties with coping, significant fatigue and memory impairment. Her physicians at [facility] are recommending her to be started on CNS stimulants which I am not comfortable prescribing in view of recurrent seizures and my lack of knowledge in terms of interactions with her antiseizure medications. I will refer her to see a local provider for further recommendation. The patient will call me back regarding future return appointment.

## 2015-08-13 NOTE — Assessment & Plan Note (Signed)
She has chronic headache which is multifactorial, related to prior resection, possibly poor sleep and recent taper of pain medicine. Recently, she also described an element of neuropathic pain which was exacerbated by the cold weather, relieved with warm compress and wearing a warm hat We will continue conservative management with her current medications.

## 2015-08-13 NOTE — Assessment & Plan Note (Signed)
She has poor memory after surgery and possibly from recent recurrent seizures. She appears to be coping by avoiding a lot of things down. She has chronic fatigue, anxiety and constant worry about disease progression in the future She denies depression or suicidal ideation. As above, her physician has recommended a CNS stimulant. I recommend referral to neuro psych evaluation and management

## 2015-08-17 ENCOUNTER — Other Ambulatory Visit: Payer: Self-pay | Admitting: Hematology and Oncology

## 2015-08-17 ENCOUNTER — Telehealth: Payer: Self-pay | Admitting: *Deleted

## 2015-08-17 NOTE — Telephone Encounter (Signed)
I scheduled an 1 hour appt for Thursday She needs to be here at 9 am on 2/2

## 2015-08-17 NOTE — Telephone Encounter (Signed)
Informed pt of appt at 9 am on Thursday.  She verbalized understanding.

## 2015-08-17 NOTE — Telephone Encounter (Signed)
Pt left VM wants to discuss starting a treatment called "PCV"  With Dr. Alvy Bimler.   Says She has been researching this and feels it would be good to start this treatment before having any surgery.  She would like to discuss w/ Dr. Alvy Bimler.

## 2015-08-19 ENCOUNTER — Encounter: Payer: Self-pay | Admitting: Hematology and Oncology

## 2015-08-20 ENCOUNTER — Ambulatory Visit (HOSPITAL_BASED_OUTPATIENT_CLINIC_OR_DEPARTMENT_OTHER): Payer: Medicaid Other | Admitting: Hematology and Oncology

## 2015-08-20 ENCOUNTER — Encounter: Payer: Self-pay | Admitting: Hematology and Oncology

## 2015-08-20 ENCOUNTER — Other Ambulatory Visit: Payer: Self-pay | Admitting: *Deleted

## 2015-08-20 ENCOUNTER — Telehealth: Payer: Self-pay | Admitting: *Deleted

## 2015-08-20 ENCOUNTER — Telehealth: Payer: Self-pay | Admitting: Hematology and Oncology

## 2015-08-20 VITALS — BP 119/97 | HR 100 | Temp 97.8°F | Resp 18 | Ht 69.0 in | Wt 171.1 lb

## 2015-08-20 DIAGNOSIS — M792 Neuralgia and neuritis, unspecified: Secondary | ICD-10-CM

## 2015-08-20 DIAGNOSIS — G44001 Cluster headache syndrome, unspecified, intractable: Secondary | ICD-10-CM

## 2015-08-20 DIAGNOSIS — G40109 Localization-related (focal) (partial) symptomatic epilepsy and epileptic syndromes with simple partial seizures, not intractable, without status epilepticus: Secondary | ICD-10-CM

## 2015-08-20 DIAGNOSIS — G44021 Chronic cluster headache, intractable: Secondary | ICD-10-CM

## 2015-08-20 DIAGNOSIS — C711 Malignant neoplasm of frontal lobe: Secondary | ICD-10-CM | POA: Diagnosis present

## 2015-08-20 DIAGNOSIS — R4189 Other symptoms and signs involving cognitive functions and awareness: Secondary | ICD-10-CM

## 2015-08-20 MED ORDER — MORPHINE SULFATE 15 MG PO TABS: 15.0000 mg | ORAL_TABLET | Freq: Four times a day (QID) | ORAL | Status: DC | PRN

## 2015-08-20 MED ORDER — LOMUSTINE 40 MG PO CAPS: 40.0000 mg | ORAL_CAPSULE | Freq: Once | ORAL | Status: DC

## 2015-08-20 MED ORDER — LOMUSTINE 100 MG PO CAPS: 200.0000 mg | ORAL_CAPSULE | Freq: Once | ORAL | Status: DC

## 2015-08-20 MED FILL — GLEOSTINE 100 MG CAPSULE: 100 | 1 days supply | Qty: 2 | Fill #0

## 2015-08-20 MED FILL — GLEOSTINE 40 MG CAPSULE: 40 | 1 days supply | Qty: 1 | Fill #0

## 2015-08-20 NOTE — Telephone Encounter (Signed)
Pt confirmed  per 02/02 POF, gave pt AVS and Calendar.... [facility]

## 2015-08-20 NOTE — Assessment & Plan Note (Addendum)
I reviewed the current NCCN guidelines. I reviewed the data using Lomustine, Procarbazine and Vincristine We discussed the risk, benefit, side effects of the regimen above including risks of pancytopenia, infection, interaction with medications causing more seizures, peripheral neuropathy, etc. and she agreed to proceed. I will enlist the help of our chemotherapy Navigator I plan on day 1 starting 08/24/2015 and I will see her on 08/31/2015 for toxicity review, blood work and day 8 of treatment Due to interaction with Zomig, the patient is advised not to use it if possible

## 2015-08-20 NOTE — Telephone Encounter (Signed)
S/w Angie at [facility].  Made her aware of referral by Dr. Alvy Bimler.  She will give information to Dr. Valentina Shaggy to make appointment.

## 2015-08-21 ENCOUNTER — Encounter: Payer: Self-pay | Admitting: Pharmacist

## 2015-08-21 ENCOUNTER — Telehealth: Payer: Self-pay | Admitting: *Deleted

## 2015-08-21 ENCOUNTER — Other Ambulatory Visit: Payer: Medicaid Other

## 2015-08-21 ENCOUNTER — Telehealth: Payer: Self-pay | Admitting: Hematology and Oncology

## 2015-08-21 NOTE — Telephone Encounter (Signed)
Lft msg for pt confirming labs/ov and chemo mailed out schedule after chemo was added... KJ

## 2015-08-21 NOTE — Telephone Encounter (Signed)
Per staff message and POF I have scheduled appts. Advised scheduler of appts. JMW  

## 2015-08-21 NOTE — Assessment & Plan Note (Signed)
Overall, she has persistent, recurrent seizures which appeared to be somewhat under control with her current medications. She will continue follow-up with her neurologist for medication adjustment.

## 2015-08-21 NOTE — Assessment & Plan Note (Signed)
She has chronic headache which is multifactorial, related to prior resection, possibly poor sleep and recent taper of pain medicine. Recently, she also described an element of neuropathic pain which was exacerbated by the cold weather, relieved with warm compress and wearing a warm hat We will continue conservative management with her current medications.  I refill her prescription to take morphine sparingly as needed

## 2015-08-21 NOTE — Progress Notes (Signed)
[facility] OFFICE PROGRESS NOTE  Patient Care Team: Lavone Orn, MD as PCP - General (Internal Medicine) Ashok Pall, MD as Consulting Physician (Neurosurgery) Burnell Blanks. Salomon Fick, MD as Consulting Physician (Neurosurgery) Burnell Blanks. 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   09/27/2013 Pathology Results Accession: [id number] pathology showed grade 2 oligodendroglioma with mutant IDH1 protein, ATRX, OLIG 2 expression. p53 is negative, low Ki-67 labeling index. 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   11/06/2013 Imaging Repeat CT scan of the head showed resolution of abscess.   01/23/2014 Surgery Dr. Christella Noa perform a subtotal gross resection of the tumor.   01/24/2014 Imaging Repeat imaging studies showed persistent and residual disease.   03/03/2014 - 09/01/2014 Chemotherapy She was started on Temodar, 5 days every 28 days   03/13/2014 Imaging Repeat MRI showed no evidence of progression of disease.   07/14/2014 Imaging Repeat MRI showed no evidence of disease progression.   09/12/2014 Imaging MRI of the liver show mild regression of the size of lesions, presumed to be benign   09/15/2014 Imaging Repeat MRI of the brain show persistent abnormalities, suspicious for residual disease   03/02/2015 Imaging  MRI brain at [facility] show persistent abnormalities   08/10/2015 Imaging Repeat MRI [facility] showed stable disease    INTERVAL HISTORY: Please see below for problem oriented charting.  she returns today with her, her husband to discuss the role for chemotherapy. She continues to have headaches and seizures but they're not new. No focal neurological deficit. We had a long discussion and review of  research paper. I gave her copy of publication regarding the role of chemotherapy for anaplastic oligodendroglioma using PCV regimen. She is interested to start treatment  REVIEW OF SYSTEMS:   Constitutional: Denies fevers, chills or abnormal weight loss Eyes: Denies blurriness of vision Ears, nose, mouth, throat, and face: Denies mucositis or sore throat Respiratory: Denies cough, dyspnea or wheezes Cardiovascular: Denies palpitation, chest discomfort or lower extremity swelling Gastrointestinal:  Denies nausea, heartburn or change in bowel habits Skin: Denies abnormal skin rashes Lymphatics: Denies new lymphadenopathy or easy bruising Neurological:Denies numbness, tingling or new weaknesses Behavioral/Psych: Mood is stable, no new changes  All other systems were reviewed with the patient and are negative.  I have reviewed the past medical history, past surgical history, social history and family history with the patient and they are unchanged from previous note.  ALLERGIES:  is allergic to adhesive; latex; percocet; shellfish allergy; soma; tramadol hcl; and vancomycin.  MEDICATIONS:  Current Outpatient Prescriptions  Medication Sig Dispense Refill  . albuterol (PROVENTIL HFA;VENTOLIN HFA) 108 (90 BASE) MCG/ACT inhaler Inhale 2 puffs into the lungs every 6 (six) hours as needed for wheezing or shortness of breath. Reported on 08/11/2015    . ALPRAZolam (XANAX) 1 MG tablet Take 1 mg by mouth 2 (two) times daily as needed (for seizures). Reported on 08/11/2015    . butalbital-acetaminophen-caffeine (FIORICET, ESGIC) 50-325-40 MG tablet TAKE 1 TABLET BY MOUTH EVERY 6 HOURS AS NEEDED FOR HEADACE. 90 tablet 0  . carisoprodol (SOMA) 350 MG tablet Take 1 tablet (350 mg total) by mouth 4 (four) times daily. For seizure prevention 90 tablet 5  . cetirizine (ZYRTEC) 10 MG tablet Take 10 mg by mouth  daily.    . dicyclomine (BENTYL) 20 MG tablet Take 20 mg by mouth at bedtime as needed and may repeat  dose one time if needed for spasms.    . divalproex (DEPAKOTE ER) 250 MG 24 hr tablet Take 2 tablets every am and 3 tablets every pm 450 tablet 1  . gabapentin (NEURONTIN) 300 MG capsule Take 3 capsules in the morning and 3 capsules in the evening 540 capsule 1  . HYDROcodone-acetaminophen (NORCO) 10-325 MG tablet Take 1 tablet by mouth every 6 (six) hours as needed. 90 tablet 0  . morphine (MSIR) 15 MG tablet Take 1 tablet (15 mg total) by mouth every 6 (six) hours as needed for severe pain. 90 tablet 0  . zolmitriptan (ZOMIG) 5 MG tablet Reported on 08/11/2015    . lomustine (CEENU) 100 MG capsule Take 2 capsules (200 mg total) by mouth once. Take on an emtpy stomach 1 hour before or 2 hours after meals. Caution:chemotherapy 2 capsule 0  . lomustine (CEENU) 40 MG capsule Take 1 capsule (40 mg total) by mouth once. Take on an empty stomach 1 hour before or 2 hours after meals. Caution:chemotherapy. 1 capsule 0   No current facility-administered medications for this visit.    PHYSICAL EXAMINATION: ECOG PERFORMANCE STATUS: 1 - Symptomatic but completely ambulatory  Filed Vitals:   08/20/15 0925 08/20/15 1045  BP: 87/66 119/97  Pulse: 100   Temp: 97.8 F (36.6 C)   Resp: 18    Filed Weights   08/20/15 0925  Weight: 171 lb 1.6 oz (77.61 kg)    GENERAL:alert, no distress and comfortable SKIN: skin color, texture, turgor are normal, no rashes or significant lesions EYES: normal, Conjunctiva are pink and non-injected, sclera clear Musculoskeletal:no cyanosis of digits and no clubbing  NEURO: alert & oriented x 3 with fluent speech, no focal motor/sensory deficits  LABORATORY DATA:  I have reviewed the data as listed    Component Value Date/Time   NA 142 08/11/2015 1018   NA 138 01/27/2014 0611   K 4.9 08/11/2015 1018   K 4.4 01/27/2014 0611   CL 99 01/27/2014 0611   CO2 27 08/11/2015 1018   CO2 26 01/27/2014 0611   GLUCOSE 108 08/11/2015 1018   GLUCOSE 115* 01/27/2014 0611    BUN 6.2* 08/11/2015 1018   BUN 8 01/27/2014 0611   CREATININE 0.7 08/11/2015 1018   CREATININE 0.48* 01/27/2014 0611   CALCIUM 9.4 08/11/2015 1018   CALCIUM 9.0 01/27/2014 0611   PROT 7.3 08/11/2015 1018   PROT 6.9 09/20/2013 0455   ALBUMIN 4.3 08/11/2015 1018   ALBUMIN 3.9 09/20/2013 0455   AST 14 08/11/2015 1018   AST 12 09/20/2013 0455   ALT 12 08/11/2015 1018   ALT 8 09/20/2013 0455   ALKPHOS 45 08/11/2015 1018   ALKPHOS 40 09/20/2013 0455   BILITOT 0.35 08/11/2015 1018   BILITOT 0.7 09/20/2013 0455   GFRNONAA >90 01/27/2014 0611   GFRAA >90 01/27/2014 0611    No results found for: SPEP, UPEP  Lab Results  Component Value Date   WBC 4.8 08/11/2015   NEUTROABS 3.4 08/11/2015   HGB 13.3 08/11/2015   HCT 39.5 08/11/2015   MCV 93.9 08/11/2015   PLT 218 08/11/2015      Chemistry      Component Value Date/Time   NA 142 08/11/2015 1018   NA 138 01/27/2014 0611   K 4.9 08/11/2015 1018   K 4.4 01/27/2014 0611   CL 99  01/27/2014 0611   CO2 27 08/11/2015 1018   CO2 26 01/27/2014 0611   BUN 6.2* 08/11/2015 1018   BUN 8 01/27/2014 0611   CREATININE 0.7 08/11/2015 1018   CREATININE 0.48* 01/27/2014 0611      Component Value Date/Time   CALCIUM 9.4 08/11/2015 1018   CALCIUM 9.0 01/27/2014 0611   ALKPHOS 45 08/11/2015 1018   ALKPHOS 40 09/20/2013 0455   AST 14 08/11/2015 1018   AST 12 09/20/2013 0455   ALT 12 08/11/2015 1018   ALT 8 09/20/2013 0455   BILITOT 0.35 08/11/2015 1018   BILITOT 0.7 09/20/2013 0455      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 I reviewed the data using Lomustine, Procarbazine and Vincristine We discussed the risk, benefit, side effects of the regimen above including risks of pancytopenia, infection, interaction with medications causing more seizures, peripheral neuropathy, etc. and she agreed to proceed. I will  enlist the help of our chemotherapy Navigator I plan on day 1 starting 08/24/2015 and I will see her on 08/31/2015 for toxicity review, blood work and day 8 of treatment Due to interaction with Zomig, the patient is advised not to use it if possible  Simple partial seizures Overall, she has persistent, recurrent seizures which appeared to be somewhat under control with her current medications. She will continue follow-up with her neurologist for medication adjustment.    Headache She has chronic headache which is multifactorial, related to prior resection, possibly poor sleep and recent taper of pain medicine. Recently, she also described an element of neuropathic pain which was exacerbated by the cold weather, relieved with warm compress and wearing a warm hat We will continue conservative management with her current medications.  I refill her prescription to take morphine sparingly as needed     Orders Placed This Encounter  Procedures  . CBC with Differential/Platelet    Standing Status: Standing     Number of Occurrences: 55     Standing Expiration Date: 08/20/2016  . Comprehensive metabolic panel    Standing Status: Standing     Number of Occurrences: 22     Standing Expiration Date: 08/20/2016  . Ambulatory referral to Neuro Rehab    Referral Priority:  Routine    Referral Type:  Consultation    Number of Visits Requested:  1  . Ambulatory referral to Neuro Rehab    Referral Priority:  Routine    Referral Type:  Consultation    Number of Visits Requested:  1   All questions were answered. The patient knows to call the clinic with any problems, questions or concerns. No barriers to learning was detected. I spent 40 minutes counseling the patient face to face. The total time spent in the appointment was 60 minutes and more than 50% was on counseling and review of test results     [facility], [place], MD 08/21/2015 10:56 AM

## 2015-08-21 NOTE — Progress Notes (Signed)
Oral Chemotherapy Pharmacist Encounter   I spoke with patient for overview of new oral chemotherapy medication: Lomustine (CCNU) and Procarbazine (part of PCV regimen) to begin. Pt is doing well 2-6. The prescriptions have been sent to the [facility] for benefit analysis and approval. Patient has medicaid so cost should not be an issue.   Counseled patient on administration, dosing, side effects, safe handling, and monitoring. Side effects include but not limited to: Nausea, vomiting, rash, myelosuppression, hair loss, infection, fatigue.  Ms. Pollino voiced understanding and appreciation. She has previously been on Temodar. She has zofran and phenergan prescriptions and will use these "around the clock" for the first few days after starting CCNU as well as procarbazine  All questions answered.  Will follow up with patient regarding pharmacy. Will follow up in 1 week for adherence and toxicity management.   Thank you,  Montel Clock, PharmD, [facility]

## 2015-08-26 ENCOUNTER — Telehealth: Payer: Self-pay | Admitting: Pharmacist

## 2015-08-26 ENCOUNTER — Other Ambulatory Visit: Payer: Self-pay | Admitting: Hematology and Oncology

## 2015-08-26 ENCOUNTER — Telehealth: Payer: Self-pay | Admitting: *Deleted

## 2015-08-26 DIAGNOSIS — C711 Malignant neoplasm of frontal lobe: Secondary | ICD-10-CM

## 2015-08-26 MED ORDER — PROCARBAZINE HCL 50 MG PO CAPS: 100.0000 mg | ORAL_CAPSULE | Freq: Every day | ORAL | Status: DC

## 2015-08-26 NOTE — Telephone Encounter (Signed)
Demographics, office note and insurance information faxed to [employer]

## 2015-08-26 NOTE — Telephone Encounter (Signed)
Left message for [facility]- referral for evaluation. Requested call back

## 2015-08-26 NOTE — Telephone Encounter (Signed)
08/26/15: Attempted to reach patient for follow up on oral medication: Lomustine/Procarbazine. No answer. Left VM for patient to call back with any questions or issues.   Thank you,  Montel Clock, PharmD, [facility] [phone]

## 2015-08-26 NOTE — Telephone Encounter (Signed)
-----   Message from Heath Lark, MD sent at 08/26/2015  7:46 AM EST ----- Regarding: referral to neuropsych   ----- Message -----    From: Jamey Ripa, PHD    Sent: 08/25/2015  12:37 PM      To: Heath Lark, MD  Dr. Alvy Bimler:  Thank you for your referral of Ms. Victoria Bates, Unfortunately, I do not accept Medicaid for neuropsychological evaluation. You could try [facility] at [phone].  Antionette Poles

## 2015-08-27 ENCOUNTER — Other Ambulatory Visit: Payer: Self-pay | Admitting: Hematology and Oncology

## 2015-08-27 ENCOUNTER — Telehealth: Payer: Self-pay

## 2015-08-27 MED ORDER — HYDROCODONE-ACETAMINOPHEN 10-325 MG PO TABS: 1.0000 | ORAL_TABLET | Freq: Four times a day (QID) | ORAL | Status: DC | PRN

## 2015-08-27 MED ORDER — BUTALBITAL-APAP-CAFFEINE 50-325-40 MG PO TABS: ORAL_TABLET | ORAL | Status: DC

## 2015-08-27 NOTE — Telephone Encounter (Signed)
Requested refill on hydrocodone and "another prescription but doesn't remember which".  He will call pt to confirm what other meds are needed.  Writer advised pt I would notify desk RN of hydrocodone refill need and that quickest way to refill non-controlled drugs is to have the pharmacy request a refill for electronic processing.  He will return call to clinic if other drug is controlled substance.   Routed to Pod 6

## 2015-08-28 ENCOUNTER — Telehealth: Payer: Self-pay | Admitting: Pharmacist

## 2015-08-28 ENCOUNTER — Encounter: Payer: Self-pay | Admitting: Pharmacist

## 2015-08-28 NOTE — Telephone Encounter (Signed)
08/28/15: Update on [facility] specialty pharmacy and Procarbazine shipment. After multiple calls from myself and Ms. Shobe over the last few days to [employer] [phone]) and their insurance department, there are still issues with approval and shipment of the procarbazine. We have requested expedited review and shipment as well as a one time rx fill so Ms. Salido can receive the procarbazine by Monday. This has been a difficult process and has been frustrating and taxing on Mrs. Arno as her energy levels are already low. [facility] specialty has a Soil scientist with procarbazine ([place] brand) as the only pharmacy able to dispense.   Montel Clock, PharmD, [facility]

## 2015-08-28 NOTE — Progress Notes (Signed)
Oral Chemotherapy Follow-Up Form  Original Start date of oral chemotherapy: _2/6/17 - Day 1 Cycle 1__   Called patient today to follow up regarding patient's oral chemotherapy medication: _Lomustine (CCNU) and procarbazine__  Pt is doing well today. She took lomustine (CCNU) on Monday night (Day 1 cycle 1). This resulted in a few days of nausea and vomiting. She has been using phenergan and zofran as anti-nausea medications. Today is the best she has felt this week. She does report fatigue as well. We will continue to monitor these symptoms throughout her PCV treatment and make adjustments to supportive care as needed.   Her procarbazine is being shipped from [place] and should be delivered by Monday 08/30/14 after some delays with the specialty pharmacy so she can start the procarbazine on Day 8.  Pt reports _0___ tablets/doses missed in the last week/month.    Pt reports the following side effects: __nausea/vomiting and fatigue__   Will follow up and call patient again in _1 week__   Thank you,  Montel Clock, PharmD, [facility]

## 2015-08-29 ENCOUNTER — Encounter (HOSPITAL_COMMUNITY): Payer: Self-pay | Admitting: Emergency Medicine

## 2015-08-29 ENCOUNTER — Emergency Department (HOSPITAL_COMMUNITY)
Admission: EM | Admit: 2015-08-29 | Discharge: 2015-08-30 | Disposition: A | Discharge status: Home / Self Care | Payer: Medicaid Other | Source: Home / Self Care

## 2015-08-29 DIAGNOSIS — R0602 Shortness of breath: Secondary | ICD-10-CM | POA: Diagnosis present

## 2015-08-29 DIAGNOSIS — Z91040 Latex allergy status: Secondary | ICD-10-CM | POA: Diagnosis not present

## 2015-08-29 DIAGNOSIS — Z86011 Personal history of benign neoplasm of the brain: Secondary | ICD-10-CM | POA: Diagnosis not present

## 2015-08-29 DIAGNOSIS — J45901 Unspecified asthma with (acute) exacerbation: Secondary | ICD-10-CM | POA: Diagnosis not present

## 2015-08-29 DIAGNOSIS — Z3202 Encounter for pregnancy test, result negative: Secondary | ICD-10-CM | POA: Diagnosis not present

## 2015-08-29 DIAGNOSIS — C719 Malignant neoplasm of brain, unspecified: Secondary | ICD-10-CM | POA: Diagnosis not present

## 2015-08-29 DIAGNOSIS — Z79899 Other long term (current) drug therapy: Secondary | ICD-10-CM | POA: Diagnosis not present

## 2015-08-29 DIAGNOSIS — R000 Tachycardia, unspecified: Secondary | ICD-10-CM | POA: Diagnosis not present

## 2015-08-29 DIAGNOSIS — R0989 Other specified symptoms and signs involving the circulatory and respiratory systems: Secondary | ICD-10-CM | POA: Insufficient documentation

## 2015-08-29 DIAGNOSIS — Z8619 Personal history of other infectious and parasitic diseases: Secondary | ICD-10-CM | POA: Diagnosis not present

## 2015-08-29 DIAGNOSIS — Z8719 Personal history of other diseases of the digestive system: Secondary | ICD-10-CM | POA: Diagnosis not present

## 2015-08-29 DIAGNOSIS — Z8739 Personal history of other diseases of the musculoskeletal system and connective tissue: Secondary | ICD-10-CM | POA: Diagnosis not present

## 2015-08-29 DIAGNOSIS — J45909 Unspecified asthma, uncomplicated: Secondary | ICD-10-CM

## 2015-08-29 DIAGNOSIS — F419 Anxiety disorder, unspecified: Secondary | ICD-10-CM | POA: Diagnosis not present

## 2015-08-29 DIAGNOSIS — Z85828 Personal history of other malignant neoplasm of skin: Secondary | ICD-10-CM | POA: Diagnosis not present

## 2015-08-29 DIAGNOSIS — Z8742 Personal history of other diseases of the female genital tract: Secondary | ICD-10-CM | POA: Diagnosis not present

## 2015-08-29 DIAGNOSIS — R251 Tremor, unspecified: Secondary | ICD-10-CM | POA: Diagnosis not present

## 2015-08-29 DIAGNOSIS — G43909 Migraine, unspecified, not intractable, without status migrainosus: Secondary | ICD-10-CM | POA: Diagnosis not present

## 2015-08-29 DIAGNOSIS — Z86018 Personal history of other benign neoplasm: Secondary | ICD-10-CM | POA: Diagnosis not present

## 2015-08-29 DIAGNOSIS — Z862 Personal history of diseases of the blood and blood-forming organs and certain disorders involving the immune mechanism: Secondary | ICD-10-CM | POA: Diagnosis not present

## 2015-08-29 NOTE — ED Notes (Signed)
Patient reports drinking fluids earlier this morning and "aspirated" and was eating salsa and chips earlier this evening and "aspirated". [place] to fire station, 83% RA and given a neb treatment. Reports coughing up small amounts of food now.

## 2015-08-30 ENCOUNTER — Emergency Department (HOSPITAL_COMMUNITY): Payer: Medicaid Other

## 2015-08-30 ENCOUNTER — Emergency Department (HOSPITAL_COMMUNITY)
Admission: EM | Admit: 2015-08-30 | Discharge: 2015-08-30 | Disposition: A | Discharge status: Home / Self Care | Payer: Medicaid Other | Attending: Emergency Medicine | Admitting: Emergency Medicine

## 2015-08-30 ENCOUNTER — Encounter (HOSPITAL_COMMUNITY): Payer: Self-pay | Admitting: Emergency Medicine

## 2015-08-30 DIAGNOSIS — Z86011 Personal history of benign neoplasm of the brain: Secondary | ICD-10-CM | POA: Insufficient documentation

## 2015-08-30 DIAGNOSIS — Z3202 Encounter for pregnancy test, result negative: Secondary | ICD-10-CM | POA: Insufficient documentation

## 2015-08-30 DIAGNOSIS — Z8619 Personal history of other infectious and parasitic diseases: Secondary | ICD-10-CM | POA: Insufficient documentation

## 2015-08-30 DIAGNOSIS — Z79899 Other long term (current) drug therapy: Secondary | ICD-10-CM | POA: Insufficient documentation

## 2015-08-30 DIAGNOSIS — Z91040 Latex allergy status: Secondary | ICD-10-CM | POA: Insufficient documentation

## 2015-08-30 DIAGNOSIS — R251 Tremor, unspecified: Secondary | ICD-10-CM | POA: Insufficient documentation

## 2015-08-30 DIAGNOSIS — J45901 Unspecified asthma with (acute) exacerbation: Secondary | ICD-10-CM | POA: Insufficient documentation

## 2015-08-30 DIAGNOSIS — R059 Cough, unspecified: Secondary | ICD-10-CM

## 2015-08-30 DIAGNOSIS — F419 Anxiety disorder, unspecified: Secondary | ICD-10-CM | POA: Insufficient documentation

## 2015-08-30 DIAGNOSIS — Z86018 Personal history of other benign neoplasm: Secondary | ICD-10-CM | POA: Insufficient documentation

## 2015-08-30 DIAGNOSIS — Z8742 Personal history of other diseases of the female genital tract: Secondary | ICD-10-CM | POA: Insufficient documentation

## 2015-08-30 DIAGNOSIS — R000 Tachycardia, unspecified: Secondary | ICD-10-CM | POA: Insufficient documentation

## 2015-08-30 DIAGNOSIS — G43909 Migraine, unspecified, not intractable, without status migrainosus: Secondary | ICD-10-CM | POA: Insufficient documentation

## 2015-08-30 DIAGNOSIS — R05 Cough: Secondary | ICD-10-CM

## 2015-08-30 DIAGNOSIS — Z8739 Personal history of other diseases of the musculoskeletal system and connective tissue: Secondary | ICD-10-CM | POA: Insufficient documentation

## 2015-08-30 DIAGNOSIS — Z85828 Personal history of other malignant neoplasm of skin: Secondary | ICD-10-CM | POA: Insufficient documentation

## 2015-08-30 DIAGNOSIS — Z862 Personal history of diseases of the blood and blood-forming organs and certain disorders involving the immune mechanism: Secondary | ICD-10-CM | POA: Insufficient documentation

## 2015-08-30 DIAGNOSIS — C719 Malignant neoplasm of brain, unspecified: Secondary | ICD-10-CM | POA: Insufficient documentation

## 2015-08-30 DIAGNOSIS — R0602 Shortness of breath: Secondary | ICD-10-CM

## 2015-08-30 DIAGNOSIS — Z8719 Personal history of other diseases of the digestive system: Secondary | ICD-10-CM | POA: Insufficient documentation

## 2015-08-30 LAB — COMPREHENSIVE METABOLIC PANEL
ALT: 30 U/L (ref 14–54)
AST: 22 U/L (ref 15–41)
Albumin: 4.3 g/dL (ref 3.5–5.0)
Alkaline Phosphatase: 38 U/L (ref 38–126)
Anion gap: 14 (ref 5–15)
BUN: 7 mg/dL (ref 6–20)
CO2: 23 mmol/L (ref 22–32)
Calcium: 9.3 mg/dL (ref 8.9–10.3)
Chloride: 105 mmol/L (ref 101–111)
Creatinine, Ser: 0.63 mg/dL (ref 0.44–1.00)
GFR calc Af Amer: >60 mL/min (ref >60)
GFR calc non Af Amer: >60 mL/min (ref >60)
Glucose, Bld: 110 mg/dL — ABNORMAL HIGH (ref 65–99)
Potassium: 3.3 mmol/L — ABNORMAL LOW (ref 3.5–5.1)
Sodium: 142 mmol/L (ref 135–145)
Total Bilirubin: 0.1 mg/dL — ABNORMAL LOW (ref 0.3–1.2)
Total Protein: 6.7 g/dL (ref 6.5–8.1)

## 2015-08-30 LAB — I-STAT TROPONIN, ED: Troponin i, poc: 0 ng/mL (ref 0.00–0.08)

## 2015-08-30 LAB — CBC
HCT: 37 % (ref 36.0–46.0)
Hemoglobin: 12.7 g/dL (ref 12.0–15.0)
MCH: 31.7 pg (ref 26.0–34.0)
MCHC: 34.3 g/dL (ref 30.0–36.0)
MCV: 92.3 fL (ref 78.0–100.0)
Platelets: 157 10*3/uL (ref 150–400)
RBC: 4.01 MIL/uL (ref 3.87–5.11)
RDW: 11.3 % — ABNORMAL LOW (ref 11.5–15.5)
WBC: 4.8 10*3/uL (ref 4.0–10.5)

## 2015-08-30 LAB — I-STAT BETA HCG BLOOD, ED (MC, WL, AP ONLY): I-stat hCG, quantitative: <5 m[IU]/mL (ref <5)

## 2015-08-30 MED ORDER — ONDANSETRON HCL 4 MG/2ML IJ SOLN
4.0000 mg | Freq: Once | INTRAMUSCULAR | Status: DC
  Filled 2015-08-30: qty 2

## 2015-08-30 MED ORDER — ALPRAZOLAM 0.25 MG PO TABS
0.5000 mg | ORAL_TABLET | Freq: Once | ORAL | Status: DC
Start: 2015-08-30 — End: 2015-08-30
  Filled 2015-08-30: qty 2

## 2015-08-30 MED ORDER — LORAZEPAM 2 MG/ML IJ SOLN
0.5000 mg | Freq: Once | INTRAMUSCULAR | Status: AC
Start: 2015-08-30 — End: 2015-08-30
  Administered 2015-08-30: 0.5 mg via INTRAVENOUS
  Filled 2015-08-30: qty 1

## 2015-08-30 MED ORDER — IOHEXOL 350 MG/ML SOLN
100.0000 mL | Freq: Once | INTRAVENOUS | Status: AC | PRN
  Administered 2015-08-30: 100 mL via INTRAVENOUS

## 2015-08-30 MED ORDER — SODIUM CHLORIDE 0.9 % IV BOLUS (SEPSIS)
1000.0000 mL | Freq: Once | INTRAVENOUS | Status: AC
  Administered 2015-08-30: 1000 mL via INTRAVENOUS

## 2015-08-30 MED ORDER — BENZONATATE 100 MG PO CAPS
200.0000 mg | ORAL_CAPSULE | Freq: Once | ORAL | Status: AC
  Administered 2015-08-30: 200 mg via ORAL
  Filled 2015-08-30: qty 2

## 2015-08-30 NOTE — ED Notes (Signed)
Patient here with shortness of breath and cough that started earlier today.  Patient with history of brain tumor, started chemo earlier in the week.  Patient states she has some numbness on the left arm, which is normal for her, but she is feeling an increase in the numbness.  Patient was initially at [facility] and patient was sent to [facility].

## 2015-08-30 NOTE — Progress Notes (Signed)
Peak Flow 250

## 2015-08-30 NOTE — ED Notes (Signed)
Patient left AMA.

## 2015-08-30 NOTE — ED Notes (Signed)
Spoke with respiratory therapist in regards to peak flow.

## 2015-08-30 NOTE — ED Notes (Signed)
Peak flow given to patient by respiratory

## 2015-08-30 NOTE — ED Provider Notes (Signed)
CSN: [id number]     Arrival date & time 08/30/15  0025 History   By signing my name below, I, Randa Evens, attest that this documentation has been prepared under the direction and in the presence of Sharlett Iles, MD. Electronically Signed: Randa Evens, ED Scribe. 08/30/2015. 1:25 AM.     Chief Complaint  Patient presents with  . Cough  . Shortness of Breath   The history is provided by the patient. No language interpreter was used.   HPI Comments: Victoria Bates is a 35 y.o. female w/ h/o brain tumor who presents to the Emergency Department complaining of SOB and cough onset today at 2 PM. Pt states that today she was drinking some orange juice and may have aspirated and has not had relief since. She states that she aspirated a second time while eating chips and salsa. She states that she has tried an albuterol breathing treatment with no relief. Pt states she has also tried taking a xanax with no relief. Pt reports HX of Brain Cancer and that she has recently started chemo earlier this week. Denies vomiting, new diarrhea or other related symptoms. Pt denies anticoagulant use.    Past Medical History  Diagnosis Date  . Liver hemangioma   . Anemia   . Headache(784.0)     migraines  . Anxiety   . Asthma     very mild- no inhaler use since July 2013  . Complication of anesthesia     takes longer for patient to go under anesthesia  . Brain tumor ([place])     Hx: of  . Infection     to right head incision  . Petit mal seizure status ([place])   . Dizziness   . Seizures ([place])     petit mal serizures most days  . Herniated disc   . Nausea alone 02/12/2014  . Dysphasia 02/12/2014  . Headache 02/12/2014  . Neuropathic pain 04/08/2014  . Liver lesion 08/22/2014  . 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 Chronic fatigue 10/03/2014  . Hot flashes 10/03/2014  . Cancer ([place]) 2003    skin  . 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 Cognitive impairment 08/13/2015   Past Surgical History   Procedure Laterality Date  . Cesarean section    . Coposcopy  08/06/2012    cervical biopsy  . Left knee surgery  2000    torn meniscus  . Right flank surgery  2005    bnign mass from flank area-right  . Esophagogastroduodenoscopy (egd) with propofol N/A 08/29/2012    Procedure: ESOPHAGOGASTRODUODENOSCOPY (EGD) WITH PROPOFOL;  Surgeon: Arta Silence, MD;  Location: [facility];  Service: Endoscopy;  Laterality: N/A;  . Colonoscopy with propofol N/A 08/29/2012    Procedure: COLONOSCOPY WITH PROPOFOL;  Surgeon: Arta Silence, MD;  Location: [facility];  Service: Endoscopy;  Laterality: N/A;  . Wisdom tooth extraction    . Robotic assisted total hysterectomy with bilateral salpingo oopherectomy Bilateral 09/25/2012    Procedure: ROBOTIC ASSISTED TOTAL HYSTERECTOMY WITH BILATERAL SALPINGO OOPHORECTOMY;  Surgeon: Maeola Sarah. Landry Mellow, MD;  Location: [facility];  Service: Gynecology;  Laterality: Bilateral;  . Abdominal hysterectomy    . Craniotomy Right 09/26/2013    Procedure: Awake Right frontal open brain biopsy;  Surgeon: Winfield Cunas, MD;  Location: [facility];  Service: Neurosurgery;  Laterality: Right;  awake Right frontal open brain biopsy  . Craniotomy N/A 10/30/2013    Procedure: debridement of cranial wound  right temporal incision, ;  Surgeon:  Winfield Cunas, MD;  Location: [facility];  Service: Neurosurgery;  Laterality: N/A;  . Craniotomy N/A 01/23/2014    Procedure: Awake Craniotomy with BrainLab;  Surgeon: Winfield Cunas, MD;  Location: [facility];  Service: Neurosurgery;  Laterality: N/A;  Awake Craniotomy  tumor with BrainLab  . Rotator cuff repair     Family History  Problem Relation Age of Onset  . Hypertension Mother   . Skin cancer Mother   . Thyroid cancer Father   . Skin cancer Father   . Breast cancer Maternal Aunt     dx in her 30s  . Prostate cancer Maternal Uncle 63  . Melanoma Maternal Uncle   . Ovarian cancer Maternal Grandmother     dx in her late 71s-40s  . Breast  cancer Maternal Aunt     bilateral breast cancer dx in 74s and 89s; possibly BRCA+  . Other Son     multiple CAL spots, bright spots on brain MRI, ? chiari malformation   Social History  Substance Use Topics  . Smoking status: Never Smoker   . Smokeless tobacco: Never Used  . Alcohol Use: No   OB History    No data available     Review of Systems 10 Systems reviewed and all are negative for acute change except as noted in the HPI.   Allergies  Adhesive; Latex; Percocet; Shellfish allergy; Soma; Tramadol hcl; and Vancomycin  Home Medications   Prior to Admission medications   Medication Sig Start Date End Date Taking? Authorizing Provider  albuterol (PROVENTIL HFA;VENTOLIN HFA) 108 (90 BASE) MCG/ACT inhaler Inhale 2 puffs into the lungs every 6 (six) hours as needed for wheezing or shortness of breath. Reported on 08/11/2015   Yes Historical Provider, MD  ALPRAZolam [name] 1 MG tablet Take 1 mg by mouth 2 (two) times daily as needed (for seizures). Reported on 08/11/2015   Yes Historical Provider, MD  butalbital-acetaminophen-caffeine (FIORICET, ESGIC) 50-325-40 MG tablet TAKE 1 TABLET BY MOUTH EVERY 6 HOURS AS NEEDED FOR HEADACE. 08/27/15  Yes Heath Lark, MD  carisoprodol (SOMA) 350 MG tablet Take 1 tablet (350 mg total) by mouth 4 (four) times daily. For seizure prevention 07/21/15  Yes Heath Lark, MD  cetirizine (ZYRTEC) 10 MG tablet Take 10 mg by mouth daily.   Yes Historical Provider, MD  dicyclomine (BENTYL) 20 MG tablet Take 20 mg by mouth at bedtime as needed and may repeat dose one time if needed for spasms.   Yes Historical Provider, MD  divalproex (DEPAKOTE ER) 250 MG 24 hr tablet Take 2 tablets every am and 3 tablets every pm 04/16/15  Yes Cameron Sprang, MD  gabapentin (NEURONTIN) 300 MG capsule Take 3 capsules in the morning and 3 capsules in the evening 04/16/15  Yes Cameron Sprang, MD  HYDROcodone-acetaminophen [facility]) 10-325 MG tablet Take 1 tablet by mouth every 6 (six)  hours as needed. 08/27/15  Yes Heath Lark, MD  lomustine (CEENU) 100 MG capsule Take 2 capsules (200 mg total) by mouth once. Take on an emtpy stomach 1 hour before or 2 hours after meals. Caution:chemotherapy 08/20/15  Yes Heath Lark, MD  lomustine (CEENU) 40 MG capsule Take 1 capsule (40 mg total) by mouth once. Take on an empty stomach 1 hour before or 2 hours after meals. Caution:chemotherapy. 08/20/15  Yes Heath Lark, MD  morphine (MSIR) 15 MG tablet Take 1 tablet (15 mg total) by mouth every 6 (six) hours as needed for severe pain.  08/20/15  Yes Heath Lark, MD  ondansetron (ZOFRAN) 4 MG tablet Take 4 mg by mouth every 8 (eight) hours as needed for nausea or vomiting.   Yes Historical Provider, MD  procarbazine (MATULANE) 50 MG capsule Take 2 capsules (100 mg total) by mouth daily. Follow low tyramine diet. Take for 14 days starting on Day 8. Repeat every 6 weeks 08/26/15  Yes Heath Lark, MD  promethazine (PHENERGAN) 25 MG tablet Take 25 mg by mouth every 6 (six) hours as needed for nausea or vomiting.   Yes Historical Provider, MD  zolmitriptan (ZOMIG) 5 MG tablet Take 5 mg by mouth as needed for migraine.   Yes Historical Provider, MD  LORazepam (ATIVAN) 0.5 MG tablet Take 1 tablet (0.5 mg total) by mouth 2 (two) times daily as needed for anxiety. 08/31/15   Ni Gorsuch, MD   BP 102/52 mmHg  Pulse 102  Temp(Src) 97.7 F (36.5 C) (Oral)  Resp 16  SpO2 100%  LMP 09/20/2012   Physical Exam  Constitutional: She is oriented to person, place, and time. She appears well-developed and well-nourished. No distress.  Frequent cough.   HENT:  Head: Normocephalic and atraumatic.  Moist mucous membranes  Eyes: Conjunctivae are normal. Pupils are equal, round, and reactive to light.  Neck: Neck supple.  Cardiovascular: Regular rhythm and normal heart sounds.  Tachycardia present.   No murmur heard. Pulmonary/Chest: Effort normal and breath sounds normal.  Abdominal: Soft. Bowel sounds are normal. She  exhibits no distension. There is no tenderness.  Musculoskeletal: She exhibits no edema.  Neurological: She is alert and oriented to person, place, and time.  Fluent speech  Skin: Skin is warm and dry.  Psychiatric: Judgment normal.  Anxious, mildly tremorous.     Nursing note and vitals reviewed.   ED Course  Procedures (including critical care time) DIAGNOSTIC STUDIES: Oxygen Saturation is 100% on RA, normal by my interpretation.    COORDINATION OF CARE: 1:23 AM-Discussed treatment plan with pt at bedside and pt agreed to plan.     Labs Review Labs Reviewed  CBC - Abnormal; Notable for the following:    RDW 11.3 (*)    All other components within normal limits  COMPREHENSIVE METABOLIC PANEL - Abnormal; Notable for the following:    Potassium 3.3 (*)    Glucose, Bld 110 (*)    Total Bilirubin 0.1 (*)    All other components within normal limits  I-STAT TROPOININ, ED  I-STAT BETA HCG BLOOD, ED ([facility], [facility], [facility] ONLY)    Imaging Review Dg Chest 2 View  08/30/2015  CLINICAL DATA:  Acute onset of cough and shortness of breath. Initial encounter. EXAM: CHEST  2 VIEW COMPARISON:  Chest radiograph and CTA of the chest performed 10/31/2013 FINDINGS: The lungs are well-aerated and clear. There is no evidence of focal opacification, pleural effusion or pneumothorax. The heart is normal in size; the mediastinal contour is within normal limits. No acute osseous abnormalities are seen. IMPRESSION: No acute cardiopulmonary process seen. Electronically Signed   By: Garald Balding M.D.   On: 08/30/2015 02:26   Ct Angio Chest Pe W/cm &/or Wo Cm  08/30/2015  CLINICAL DATA:  Acute onset of shortness of breath, cough and tachycardia. Sensation of recent aspiration. Dysphagia. 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 Initial encounter. EXAM: CT ANGIOGRAPHY CHEST WITH CONTRAST TECHNIQUE: Multidetector CT imaging of the chest was performed using the standard protocol  during bolus administration of intravenous contrast. Multiplanar  CT image reconstructions and MIPs were obtained to evaluate the vascular anatomy. CONTRAST:  172m OMNIPAQUE IOHEXOL 350 MG/ML SOLN COMPARISON:  Chest radiograph performed earlier today at 2:00 a.m., and CTA of the chest performed 10/31/2013 FINDINGS: There is no evidence of pulmonary embolus. The lungs are essentially clear bilaterally. There is no evidence of significant focal consolidation, pleural effusion or pneumothorax. No masses are identified; no abnormal focal contrast enhancement is seen. The mediastinum is unremarkable in appearance. No mediastinal lymphadenopathy is seen. No pericardial effusion is identified. The great vessels are grossly unremarkable. No axillary lymphadenopathy is seen. The visualized portions of the thyroid gland are unremarkable in appearance. The visualized portions of the liver and spleen are unremarkable. The visualized portions of the pancreas, gallbladder, stomach, adrenal glands and kidneys are within normal limits. No acute osseous abnormalities are seen. Review of the MIP images confirms the above findings. IMPRESSION: No evidence of pulmonary embolus.  Lungs clear bilaterally. Electronically Signed   By: JGarald BaldingM.D.   On: 08/30/2015 05:33   I have personally reviewed and evaluated these lab results as part of my medical decision-making.   EKG Interpretation None      Medications  sodium chloride 0.9 % bolus 1,000 mL (0 mLs Intravenous Stopped 08/30/15 0418)  LORazepam (ATIVAN) injection 0.5 mg (0.5 mg Intravenous Given 08/30/15 0224)  benzonatate (TESSALON) capsule 200 mg (200 mg Oral Given 08/30/15 0146)  iohexol (OMNIPAQUE) 350 MG/ML injection 100 mL (100 mLs Intravenous Contrast Given 08/30/15 0455)    MDM   Final diagnoses:  Cough  Shortness of breath   Patient presents with cough and shortness of breath after aspiration episode earlier today. She had a frequent cough and was  anxious on exam with tachycardia in the 120s, O2 sat 98% on room air. No abnormal lung sounds but patient in moderate distress because of frequent cough. Gave the patient Tessalon, Ativan, and an IV fluid bolus and obtained above labs which were unremarkable. Chest x-ray negative for acute process. Given the patient's significant tachycardia and history of cancer, obtained CTA of the chest to rule out PE or occult pneumonia. CT was negative. On reexamination, the patient was more comfortable, although still coughing she appeared improved. Discussed supportive care instructions as well as return precautions. Patient voiced understanding and was discharged in satisfactory condition.  I personally performed the services described in this documentation, which was scribed in my presence. The recorded information has been reviewed and is accurate.      RSharlett Iles MD 08/31/15 [phone]

## 2015-08-30 NOTE — ED Notes (Signed)
Notified CT that patient is ready for transport 

## 2015-08-30 NOTE — ED Notes (Signed)
CT updated on plan of care, awaiting placement for IV

## 2015-08-30 NOTE — Discharge Instructions (Signed)

## 2015-08-31 ENCOUNTER — Ambulatory Visit (HOSPITAL_BASED_OUTPATIENT_CLINIC_OR_DEPARTMENT_OTHER): Payer: Medicaid Other

## 2015-08-31 ENCOUNTER — Encounter: Payer: Self-pay | Admitting: Hematology and Oncology

## 2015-08-31 ENCOUNTER — Encounter: Payer: Self-pay | Admitting: General Practice

## 2015-08-31 ENCOUNTER — Telehealth: Payer: Self-pay | Admitting: Pharmacist

## 2015-08-31 ENCOUNTER — Ambulatory Visit (HOSPITAL_BASED_OUTPATIENT_CLINIC_OR_DEPARTMENT_OTHER): Payer: Medicaid Other | Admitting: Hematology and Oncology

## 2015-08-31 ENCOUNTER — Other Ambulatory Visit (HOSPITAL_BASED_OUTPATIENT_CLINIC_OR_DEPARTMENT_OTHER): Payer: Medicaid Other

## 2015-08-31 ENCOUNTER — Telehealth: Payer: Self-pay | Admitting: Hematology and Oncology

## 2015-08-31 VITALS — BP 119/96 | HR 89 | Temp 98.0°F | Resp 19 | Ht 69.0 in | Wt 171.6 lb

## 2015-08-31 DIAGNOSIS — R112 Nausea with vomiting, unspecified: Secondary | ICD-10-CM

## 2015-08-31 DIAGNOSIS — C711 Malignant neoplasm of frontal lobe: Secondary | ICD-10-CM

## 2015-08-31 DIAGNOSIS — Z5111 Encounter for antineoplastic chemotherapy: Secondary | ICD-10-CM | POA: Diagnosis not present

## 2015-08-31 DIAGNOSIS — F419 Anxiety disorder, unspecified: Secondary | ICD-10-CM

## 2015-08-31 DIAGNOSIS — J4520 Mild intermittent asthma, uncomplicated: Secondary | ICD-10-CM

## 2015-08-31 LAB — COMPREHENSIVE METABOLIC PANEL
ALT: 22 U/L (ref 0–55)
AST: 20 U/L (ref 5–34)
Albumin: 4 g/dL (ref 3.5–5.0)
Alkaline Phosphatase: 40 U/L (ref 40–150)
Anion Gap: 9 mEq/L (ref 3–11)
BUN: 5.5 mg/dL — ABNORMAL LOW (ref 7.0–26.0)
CO2: 26 mEq/L (ref 22–29)
Calcium: 9 mg/dL (ref 8.4–10.4)
Chloride: 107 mEq/L (ref 98–109)
Creatinine: 0.7 mg/dL (ref 0.6–1.1)
EGFR: >90 mL/min/{1.73_m2} (ref >90)
Glucose: 76 mg/dl (ref 70–140)
Potassium: 4.3 mEq/L (ref 3.5–5.1)
Sodium: 141 mEq/L (ref 136–145)
Total Bilirubin: 0.48 mg/dL (ref 0.20–1.20)
Total Protein: 6.8 g/dL (ref 6.4–8.3)

## 2015-08-31 LAB — CBC WITH DIFFERENTIAL/PLATELET
BASO%: 1.1 % (ref 0.0–2.0)
Basophils Absolute: 0 10*3/uL (ref 0.0–0.1)
EOS%: 0.9 % (ref 0.0–7.0)
Eosinophils Absolute: 0 10*3/uL (ref 0.0–0.5)
HCT: 35.5 % (ref 34.8–46.6)
HGB: 12 g/dL (ref 11.6–15.9)
LYMPH%: 46.9 % (ref 14.0–49.7)
MCH: 31.6 pg (ref 25.1–34.0)
MCHC: 33.8 g/dL (ref 31.5–36.0)
MCV: 93.7 fL (ref 79.5–101.0)
MONO#: 0.2 10*3/uL (ref 0.1–0.9)
MONO%: 5.2 % (ref 0.0–14.0)
NEUT#: 1.6 10*3/uL (ref 1.5–6.5)
NEUT%: 45.9 % (ref 38.4–76.8)
Platelets: 145 10*3/uL (ref 145–400)
RBC: 3.79 10*6/uL (ref 3.70–5.45)
RDW: 11.6 % (ref 11.2–14.5)
WBC: 3.5 10*3/uL — ABNORMAL LOW (ref 3.9–10.3)
lymph#: 1.6 10*3/uL (ref 0.9–3.3)

## 2015-08-31 MED ORDER — LORAZEPAM 0.5 MG PO TABS: 0.5000 mg | ORAL_TABLET | Freq: Two times a day (BID) | ORAL | Status: DC | PRN

## 2015-08-31 MED ORDER — SODIUM CHLORIDE 0.9 % IV SOLN
Freq: Once | INTRAVENOUS | Status: AC
  Administered 2015-08-31: 15:00:00 via INTRAVENOUS

## 2015-08-31 MED ORDER — VINCRISTINE SULFATE CHEMO INJECTION 1 MG/ML
2.0000 mg | Freq: Once | INTRAVENOUS | Status: AC
  Administered 2015-08-31: 2 mg via INTRAVENOUS
  Filled 2015-08-31: qty 2

## 2015-08-31 MED ORDER — SODIUM CHLORIDE 0.9 % IV SOLN
Freq: Once | INTRAVENOUS | Status: AC
  Administered 2015-08-31: 8 mg via INTRAVENOUS
  Filled 2015-08-31: qty 4

## 2015-08-31 NOTE — Patient Instructions (Signed)
Vincristine injection What is this medicine? VINCRISTINE (vin KRIS teen) is a chemotherapy drug. It slows the growth of cancer cells. This medicine is used to treat many types of cancer like Hodgkin's disease, leukemia, non-Hodgkin's lymphoma, neuroblastoma (brain cancer), rhabdomyosarcoma, and Wilms' tumor. This medicine may be used for other purposes; ask your health care provider or pharmacist if you have questions. What should I tell my health care provider before I take this medicine? They need to know if you have any of these conditions: -blood disorders -gout -infection (especially chickenpox, cold sores, or herpes) -kidney disease -liver disease -lung disease -nervous system disease like Charcot-Marie-Tooth (CMT) -recent or ongoing radiation therapy -an unusual or allergic reaction to vincristine, other chemotherapy agents, other medicines, foods, dyes, or preservatives -pregnant or trying to get pregnant -breast-feeding How should I use this medicine? This drug is given as an infusion into a vein. It is administered in a hospital or clinic by a specially trained health care professional. If you have pain, swelling, burning, or any unusual feeling around the site of your injection, tell your health care professional right away. Talk to your pediatrician regarding the use of this medicine in children. While this drug may be prescribed for selected conditions, precautions do apply. Overdosage: If you think you have taken too much of this medicine contact a poison control center or emergency room at once. NOTE: This medicine is only for you. Do not share this medicine with others. What if I miss a dose? It is important not to miss your dose. Call your doctor or health care professional if you are unable to keep an appointment. What may interact with this medicine? Do not take this medicine with any of the following medications: -itraconazole -mibefradil -voriconazole This medicine  may also interact with the following medications: -cyclosporine -erythromycin -fluconazole -ketoconazole -medicines for HIV like delavirdine, efavirenz, nevirapine -medicines for seizures like ethotoin, fosphenotoin, phenytoin -medicines to increase blood counts like filgrastim, pegfilgrastim, sargramostim -other chemotherapy drugs like cisplatin, L-asparaginase, methotrexate, mitomycin, paclitaxel -pegaspargase -vaccines -zalcitabine, ddC Talk to your doctor or health care professional before taking any of these medicines: -acetaminophen -aspirin -ibuprofen -ketoprofen -naproxen This list may not describe all possible interactions. Give your health care provider a list of all the medicines, herbs, non-prescription drugs, or dietary supplements you use. Also tell them if you smoke, drink alcohol, or use illegal drugs. Some items may interact with your medicine. What should I watch for while using this medicine? Your condition will be monitored carefully while you are receiving this medicine. You will need important blood work done while you are taking this medicine. This drug may make you feel generally unwell. This is not uncommon, as chemotherapy can affect healthy cells as well as cancer cells. Report any side effects. Continue your course of treatment even though you feel ill unless your doctor tells you to stop. In some cases, you may be given additional medicines to help with side effects. Follow all directions for their use. Call your doctor or health care professional for advice if you get a fever, chills or sore throat, or other symptoms of a cold or flu. Do not treat yourself. Avoid taking products that contain aspirin, acetaminophen, ibuprofen, naproxen, or ketoprofen unless instructed by your doctor. These medicines may hide a fever. Do not become pregnant while taking this medicine. Women should inform their doctor if they wish to become pregnant or think they might be pregnant.  There is a potential for serious side effects to  an unborn child. Talk to your health care professional or pharmacist for more information. Do not breast-feed an infant while taking this medicine. Men may have a lower sperm count while taking this medicine. Talk to your doctor if you plan to father a child. What side effects may I notice from receiving this medicine? Side effects that you should report to your doctor or health care professional as soon as possible: -allergic reactions like skin rash, itching or hives, swelling of the face, lips, or tongue -breathing problems -confusion or changes in emotions or moods -constipation -cough -mouth sores -muscle weakness -nausea and vomiting -pain, swelling, redness or irritation at the injection site -pain, tingling, numbness in the hands or feet -problems with balance, talking, walking -seizures -stomach pain -trouble passing urine or change in the amount of urine Side effects that usually do not require medical attention (report to your doctor or health care professional if they continue or are bothersome): -diarrhea -hair loss -jaw pain -loss of appetite This list may not describe all possible side effects. Call your doctor for medical advice about side effects. You may report side effects to FDA at 1-800-FDA-1088. Where should I keep my medicine? This drug is given in a hospital or clinic and will not be stored at home. NOTE: This sheet is a summary. It may not cover all possible information. If you have questions about this medicine, talk to your doctor, pharmacist, or health care provider.    2016, Elsevier/Gold Standard. (2008-03-31 17:17:13)

## 2015-08-31 NOTE — Telephone Encounter (Signed)
Pt confirmed labs/ov per 02/13 POF, gave pt AVS and Calendar... KJ °

## 2015-08-31 NOTE — Assessment & Plan Note (Signed)
She has problem with asthma. Recently, she thought she may have aspirated food. CT scan is clear. Recommend she continue to use albuterol as needed. Clinically, she does not appears to be wheezing today.

## 2015-08-31 NOTE — Progress Notes (Signed)
Spiritual Care Note  Received calls and email from [place] regarding several questions, including how to support her 34yo son Dorothea Ogle, who has been showing more signs of distress at school (including being bullied because his mom has cancer).  Spoke briefly by phone, following up in person.  Met husband [name], as well.  Provided information to both about [employer] (counseling, group support/programs) for helping children cope with serious illness--and helping adults support children.  Pt would also like to bring Dorothea Ogle to [facility] to normalize the environment (help it seem less scary/intimidating).  We plan to strategize with [place] (esp counseling intern Aviry Reich, LPCA, whom pt also met in chemo class) to plan a gentle visit with a focus on art, garden, and kind staff.  Also provided pastoral presence, reflective listening, normalization of feelings, and info about Support Center team/resources to pt's husband Scientist, physiological.  He verbalized appreciation for face-to-face intro of support services and has team's contact info.  Dari plans to f/u by phone, but please also page as needs arise/circumstances change.  Thank you.  [place], [place], [facility] Pager [phone] Voicemail  [phone]

## 2015-08-31 NOTE — Telephone Encounter (Signed)
08/28/15: Procarbazine update. Spoke with Ms. Spangle late Friday evening. [facility] was able to process her procarbazine and the medication will be delivered on Saturday 2/11.   Thank you,  Montel Clock, PharmD, [facility]

## 2015-08-31 NOTE — Assessment & Plan Note (Signed)
The patient developed severe nausea last week after cycle 1 of treatment. I recommend she take Zofran daily this week and next week to prevent nausea or vomiting and if she has uncontrolled nausea, she will call us so that we can bring her in for IV anti-emetics and IV fluids She also had poor-venous access. We discussed potential port placement. I will see her back in 3 weeks for further assessment

## 2015-08-31 NOTE — Progress Notes (Signed)
[place] OFFICE PROGRESS NOTE  Patient Care Team: Lavone Orn, MD as PCP - General (Internal Medicine) Ashok Pall, MD as Consulting Physician (Neurosurgery) Burnell Blanks. Salomon Fick, MD as Consulting Physician (Neurosurgery) Burnell Blanks. 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   09/27/2013 Pathology Results Accession: [id number] pathology showed grade 2 oligodendroglioma with mutant IDH1 protein, ATRX, OLIG 2 expression. p53 is negative, low Ki-67 labeling index. 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   11/06/2013 Imaging Repeat CT scan of the head showed resolution of abscess.   01/23/2014 Surgery Dr. Christella Noa perform a subtotal gross resection of the tumor.   01/24/2014 Imaging Repeat imaging studies showed persistent and residual disease.   03/03/2014 - 09/01/2014 Chemotherapy She was started on Temodar, 5 days every 28 days   03/13/2014 Imaging Repeat MRI showed no evidence of progression of disease.   07/14/2014 Imaging Repeat MRI showed no evidence of disease progression.   09/12/2014 Imaging MRI of the liver show mild regression of the size of lesions, presumed to be benign   09/15/2014 Imaging Repeat MRI of the brain show persistent abnormalities, suspicious for residual disease   03/02/2015 Imaging  MRI brain at [facility] show persistent abnormalities   08/10/2015 Imaging Repeat MRI [facility] showed stable disease   08/24/2015 -  Chemotherapy She was started on Lomustine, Procarbazine and Vincristine chemotherapy   08/30/2015 Imaging CT chest showed no evidence of aspiration pneumonia    INTERVAL HISTORY: Please see below for problem oriented charting. She was seen in the ER recently for possible  aspiration. Chest x-ray and CT scan was negative for pneumonia. Since then, she have a sensation of a scratchy throat. She also has asthmatic attack when in the emergency department which subsequently resolved with albuterol nebulizers. She found lorazepam helpful. Last week, she developed significant nausea and vomiting lasting about 10-12 hours. She denies constipation. Denies worsening seizure activity and denies new neurological deficit She is noted to have poor venous access recently.  REVIEW OF SYSTEMS:   Constitutional: Denies fevers, chills or abnormal weight loss Eyes: Denies blurriness of vision Ears, nose, mouth, throat, and face: Denies mucositis or sore throat Cardiovascular: Denies palpitation, chest discomfort or lower extremity swelling Skin: Denies abnormal skin rashes Lymphatics: Denies new lymphadenopathy or easy bruising Neurological:Denies numbness, tingling or new weaknesses Behavioral/Psych: Mood is stable, no new changes  All other systems were reviewed with the patient and are negative.  I have reviewed the past medical history, past surgical history, social history and family history with the patient and they are unchanged from previous note.  ALLERGIES:  is allergic to adhesive; latex; percocet; shellfish allergy; soma; tramadol hcl; and vancomycin.  MEDICATIONS:  Current Outpatient Prescriptions  Medication Sig Dispense Refill  . albuterol (PROVENTIL HFA;VENTOLIN HFA) 108 (90 BASE) MCG/ACT inhaler Inhale 2 puffs into the lungs every 6 (six) hours as needed for wheezing or shortness of breath. Reported on 08/11/2015    . ALPRAZolam (XANAX) 1 MG tablet Take 1 mg by mouth 2 (two) times daily as needed (for seizures). Reported on 08/11/2015    . butalbital-acetaminophen-caffeine (FIORICET, ESGIC) 50-325-40 MG tablet TAKE 1 TABLET BY MOUTH EVERY 6 HOURS AS NEEDED FOR HEADACE. 90 tablet 0  . carisoprodol (SOMA) 350 MG  tablet Take 1 tablet (350 mg total) by mouth 4  (four) times daily. For seizure prevention 90 tablet 5  . cetirizine (ZYRTEC) 10 MG tablet Take 10 mg by mouth daily.    Marland Kitchen dicyclomine (BENTYL) 20 MG tablet Take 20 mg by mouth at bedtime as needed and may repeat dose one time if needed for spasms.    . divalproex (DEPAKOTE ER) 250 MG 24 hr tablet Take 2 tablets every am and 3 tablets every pm 450 tablet 1  . gabapentin (NEURONTIN) 300 MG capsule Take 3 capsules in the morning and 3 capsules in the evening 540 capsule 1  . HYDROcodone-acetaminophen (NORCO) 10-325 MG tablet Take 1 tablet by mouth every 6 (six) hours as needed. 90 tablet 0  . lomustine (CEENU) 100 MG capsule Take 2 capsules (200 mg total) by mouth once. Take on an emtpy stomach 1 hour before or 2 hours after meals. Caution:chemotherapy 2 capsule 0  . lomustine (CEENU) 40 MG capsule Take 1 capsule (40 mg total) by mouth once. Take on an empty stomach 1 hour before or 2 hours after meals. Caution:chemotherapy. 1 capsule 0  . LORazepam (ATIVAN) 0.5 MG tablet Take 1 tablet (0.5 mg total) by mouth 2 (two) times daily as needed for anxiety. 30 tablet 0  . morphine (MSIR) 15 MG tablet Take 1 tablet (15 mg total) by mouth every 6 (six) hours as needed for severe pain. 90 tablet 0  . ondansetron (ZOFRAN) 4 MG tablet Take 4 mg by mouth every 8 (eight) hours as needed for nausea or vomiting.    . procarbazine (MATULANE) 50 MG capsule Take 2 capsules (100 mg total) by mouth daily. Follow low tyramine diet. Take for 14 days starting on Day 8. Repeat every 6 weeks 28 capsule 0  . promethazine (PHENERGAN) 25 MG tablet Take 25 mg by mouth every 6 (six) hours as needed for nausea or vomiting.    . zolmitriptan (ZOMIG) 5 MG tablet Take 5 mg by mouth as needed for migraine.     No current facility-administered medications for this visit.    PHYSICAL EXAMINATION: ECOG PERFORMANCE STATUS: 1 - Symptomatic but completely ambulatory  Filed Vitals:   08/31/15 1320  BP: 119/96  Pulse: 89  Temp: 98 F  (36.7 C)  Resp: 19   Filed Weights   08/31/15 1320  Weight: 171 lb 9.6 oz (77.837 kg)    GENERAL:alert, no distress and comfortable SKIN: skin color, texture, turgor are normal, no rashes or significant lesions EYES: normal, Conjunctiva are pink and non-injected, sclera clear OROPHARYNX:no exudate, no erythema and lips, buccal mucosa, and tongue normal  NECK: supple, thyroid normal size, non-tender, without nodularity LYMPH:  no palpable lymphadenopathy in the cervical, axillary or inguinal LUNGS: clear to auscultation and percussion with normal breathing effort HEART: regular rate & rhythm and no murmurs and no lower extremity edema ABDOMEN:abdomen soft, non-tender and normal bowel sounds Musculoskeletal:no cyanosis of digits and no clubbing  NEURO: alert & oriented x 3 with fluent speech, no focal motor/sensory deficits  LABORATORY DATA:  I have reviewed the data as listed    Component Value Date/Time   NA 141 08/31/2015 1305   NA 142 08/30/2015 0105   K 4.3 08/31/2015 1305   K 3.3* 08/30/2015 0105   CL 105 08/30/2015 0105   CO2 26 08/31/2015 1305   CO2 23 08/30/2015 0105   GLUCOSE 76 08/31/2015 1305   GLUCOSE 110* 08/30/2015 0105   BUN 5.5* 08/31/2015 1305  BUN 7 08/30/2015 0105   CREATININE 0.7 08/31/2015 1305   CREATININE 0.63 08/30/2015 0105   CALCIUM 9.0 08/31/2015 1305   CALCIUM 9.3 08/30/2015 0105   PROT 6.8 08/31/2015 1305   PROT 6.7 08/30/2015 0105   ALBUMIN 4.0 08/31/2015 1305   ALBUMIN 4.3 08/30/2015 0105   AST 20 08/31/2015 1305   AST 22 08/30/2015 0105   ALT 22 08/31/2015 1305   ALT 30 08/30/2015 0105   ALKPHOS 40 08/31/2015 1305   ALKPHOS 38 08/30/2015 0105   BILITOT 0.48 08/31/2015 1305   BILITOT 0.1* 08/30/2015 0105   GFRNONAA >60 08/30/2015 0105   GFRAA >60 08/30/2015 0105    No results found for: SPEP, UPEP  Lab Results  Component Value Date   WBC 3.5* 08/31/2015   NEUTROABS 1.6 08/31/2015   HGB 12.0 08/31/2015   HCT 35.5 08/31/2015    MCV 93.7 08/31/2015   PLT 145 08/31/2015      Chemistry      Component Value Date/Time   NA 141 08/31/2015 1305   NA 142 08/30/2015 0105   K 4.3 08/31/2015 1305   K 3.3* 08/30/2015 0105   CL 105 08/30/2015 0105   CO2 26 08/31/2015 1305   CO2 23 08/30/2015 0105   BUN 5.5* 08/31/2015 1305   BUN 7 08/30/2015 0105   CREATININE 0.7 08/31/2015 1305   CREATININE 0.63 08/30/2015 0105      Component Value Date/Time   CALCIUM 9.0 08/31/2015 1305   CALCIUM 9.3 08/30/2015 0105   ALKPHOS 40 08/31/2015 1305   ALKPHOS 38 08/30/2015 0105   AST 20 08/31/2015 1305   AST 22 08/30/2015 0105   ALT 22 08/31/2015 1305   ALT 30 08/30/2015 0105   BILITOT 0.48 08/31/2015 1305   BILITOT 0.1* 08/30/2015 0105       RADIOGRAPHIC STUDIES: I have personally reviewed the radiological images as listed and agreed with the findings in the report. Dg Chest 2 View  08/30/2015  CLINICAL DATA:  Acute onset of cough and shortness of breath. Initial encounter. EXAM: CHEST  2 VIEW COMPARISON:  Chest radiograph and CTA of the chest performed 10/31/2013 FINDINGS: The lungs are well-aerated and clear. There is no evidence of focal opacification, pleural effusion or pneumothorax. The heart is normal in size; the mediastinal contour is within normal limits. No acute osseous abnormalities are seen. IMPRESSION: No acute cardiopulmonary process seen. Electronically Signed   By: Garald Balding M.D.   On: 08/30/2015 02:26   Ct Angio Chest Pe W/cm &/or Wo Cm  08/30/2015  CLINICAL DATA:  Acute onset of shortness of breath, cough and tachycardia. Sensation of recent aspiration. Dysphagia. 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 Initial encounter. EXAM: CT ANGIOGRAPHY CHEST WITH CONTRAST TECHNIQUE: Multidetector CT imaging of the chest was performed using the standard protocol during bolus administration of intravenous contrast. Multiplanar CT image reconstructions and MIPs were obtained to  evaluate the vascular anatomy. CONTRAST:  130m OMNIPAQUE IOHEXOL 350 MG/ML SOLN COMPARISON:  Chest radiograph performed earlier today at 2:00 a.m., and CTA of the chest performed 10/31/2013 FINDINGS: There is no evidence of pulmonary embolus. The lungs are essentially clear bilaterally. There is no evidence of significant focal consolidation, pleural effusion or pneumothorax. No masses are identified; no abnormal focal contrast enhancement is seen. The mediastinum is unremarkable in appearance. No mediastinal lymphadenopathy is seen. No pericardial effusion is identified. The great vessels are grossly unremarkable. No axillary lymphadenopathy is seen. The visualized portions of  the thyroid gland are unremarkable in appearance. The visualized portions of the liver and spleen are unremarkable. The visualized portions of the pancreas, gallbladder, stomach, adrenal glands and kidneys are within normal limits. No acute osseous abnormalities are seen. Review of the MIP images confirms the above findings. IMPRESSION: No evidence of pulmonary embolus.  Lungs clear bilaterally. Electronically Signed   By: Garald Balding M.D.   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 I recommend she take Zofran daily this week and next week to prevent nausea or vomiting and if she has uncontrolled nausea, she will call us so that we can bring her in for IV anti-emetics and IV fluids She also had poor-venous access. We discussed potential port placement. I will see her back in 3 weeks for further assessment  Nausea & vomiting She has poorly controlled nausea vomiting last week. I encouraged the patient to call us if she develops problems with nausea again. She is recommended to take Zofran daily for the next 2 weeks while on procarbazine to  reduce the risk of uncontrolled nausea and vomiting She found lorazepam very helpful in controlling her seizure, anxiety and I think it is going to be helpful for her that if she has uncontrollable nausea to also use lorazepam. I do warn her about risks of respiratory depression if taken, currently with her pain medication.  Asthma She has problem with asthma. Recently, she thought she may have aspirated food. CT scan is clear. Recommend she continue to use albuterol as needed. Clinically, she does not appears to be wheezing today.   No orders of the defined types were placed in this encounter.   All questions were answered. The patient knows to call the clinic with any problems, questions or concerns. No barriers to learning was detected. I spent 15 minutes counseling the patient face to face. The total time spent in the appointment was 20 minutes and more than 50% was on counseling and review of test results     [facility], [place], MD 08/31/2015 2:01 PM

## 2015-08-31 NOTE — Assessment & Plan Note (Addendum)
She has poorly controlled nausea vomiting last week. I encouraged the patient to call us if she develops problems with nausea again. She is recommended to take Zofran daily for the next 2 weeks while on procarbazine to reduce the risk of uncontrolled nausea and vomiting She found lorazepam very helpful in controlling her seizure, anxiety and I think it is going to be helpful for her that if she has uncontrollable nausea to also use lorazepam. I do warn her about risks of respiratory depression if taken, currently with her pain medication.

## 2015-09-02 ENCOUNTER — Telehealth: Payer: Self-pay | Admitting: Pharmacist

## 2015-09-02 NOTE — Telephone Encounter (Signed)
09/02/2015: Attempted to reach patient for follow up on oral medication: Procarbazine and to check on status of nausea/vomiting. No answer. Left VM for patient to call back with any questions or issues.   Thank you,  Montel Clock, PharmD, [facility] [phone]

## 2015-09-21 ENCOUNTER — Encounter: Payer: Self-pay | Admitting: Hematology and Oncology

## 2015-09-21 ENCOUNTER — Other Ambulatory Visit (HOSPITAL_BASED_OUTPATIENT_CLINIC_OR_DEPARTMENT_OTHER): Payer: Medicaid Other

## 2015-09-21 ENCOUNTER — Ambulatory Visit (HOSPITAL_BASED_OUTPATIENT_CLINIC_OR_DEPARTMENT_OTHER): Payer: Medicaid Other

## 2015-09-21 ENCOUNTER — Ambulatory Visit (HOSPITAL_BASED_OUTPATIENT_CLINIC_OR_DEPARTMENT_OTHER): Payer: Medicaid Other | Admitting: Hematology and Oncology

## 2015-09-21 ENCOUNTER — Telehealth: Payer: Self-pay | Admitting: Pharmacist

## 2015-09-21 VITALS — BP 114/65 | HR 82 | Temp 98.3°F | Resp 18 | Wt 168.8 lb

## 2015-09-21 DIAGNOSIS — Z5111 Encounter for antineoplastic chemotherapy: Secondary | ICD-10-CM | POA: Diagnosis not present

## 2015-09-21 DIAGNOSIS — G44021 Chronic cluster headache, intractable: Secondary | ICD-10-CM

## 2015-09-21 DIAGNOSIS — G40109 Localization-related (focal) (partial) symptomatic epilepsy and epileptic syndromes with simple partial seizures, not intractable, without status epilepticus: Secondary | ICD-10-CM | POA: Diagnosis not present

## 2015-09-21 DIAGNOSIS — D61810 Antineoplastic chemotherapy induced pancytopenia: Secondary | ICD-10-CM

## 2015-09-21 DIAGNOSIS — I878 Other specified disorders of veins: Secondary | ICD-10-CM | POA: Diagnosis not present

## 2015-09-21 DIAGNOSIS — R112 Nausea with vomiting, unspecified: Secondary | ICD-10-CM | POA: Diagnosis not present

## 2015-09-21 DIAGNOSIS — C711 Malignant neoplasm of frontal lobe: Secondary | ICD-10-CM | POA: Diagnosis not present

## 2015-09-21 DIAGNOSIS — T451X5A Adverse effect of antineoplastic and immunosuppressive drugs, initial encounter: Secondary | ICD-10-CM

## 2015-09-21 DIAGNOSIS — C719 Malignant neoplasm of brain, unspecified: Secondary | ICD-10-CM

## 2015-09-21 LAB — CBC WITH DIFFERENTIAL/PLATELET
BASO%: 0.9 % (ref 0.0–2.0)
Basophils Absolute: 0 10*3/uL (ref 0.0–0.1)
EOS%: 0.3 % (ref 0.0–7.0)
Eosinophils Absolute: 0 10*3/uL (ref 0.0–0.5)
HCT: 36.1 % (ref 34.8–46.6)
HGB: 12.5 g/dL (ref 11.6–15.9)
LYMPH%: 36.9 % (ref 14.0–49.7)
MCH: 32.1 pg (ref 25.1–34.0)
MCHC: 34.6 g/dL (ref 31.5–36.0)
MCV: 92.6 fL (ref 79.5–101.0)
MONO#: 0.4 10*3/uL (ref 0.1–0.9)
MONO%: 10.2 % (ref 0.0–14.0)
NEUT#: 1.8 10*3/uL (ref 1.5–6.5)
NEUT%: 51.7 % (ref 38.4–76.8)
Platelets: 111 10*3/uL — ABNORMAL LOW (ref 145–400)
RBC: 3.9 10*6/uL (ref 3.70–5.45)
RDW: 13 % (ref 11.2–14.5)
WBC: 3.4 10*3/uL — ABNORMAL LOW (ref 3.9–10.3)
lymph#: 1.3 10*3/uL (ref 0.9–3.3)
nRBC: 0 % (ref 0–0)

## 2015-09-21 LAB — COMPREHENSIVE METABOLIC PANEL
ALT: 14 U/L (ref 0–55)
AST: 17 U/L (ref 5–34)
Albumin: 4.1 g/dL (ref 3.5–5.0)
Alkaline Phosphatase: 40 U/L (ref 40–150)
Anion Gap: 9 mEq/L (ref 3–11)
BUN: 11.3 mg/dL (ref 7.0–26.0)
CO2: 24 mEq/L (ref 22–29)
Calcium: 8.9 mg/dL (ref 8.4–10.4)
Chloride: 107 mEq/L (ref 98–109)
Creatinine: 0.7 mg/dL (ref 0.6–1.1)
EGFR: >90 mL/min/{1.73_m2} (ref >90)
Glucose: 75 mg/dl (ref 70–140)
Potassium: 4.1 mEq/L (ref 3.5–5.1)
Sodium: 140 mEq/L (ref 136–145)
Total Bilirubin: 0.37 mg/dL (ref 0.20–1.20)
Total Protein: 7 g/dL (ref 6.4–8.3)

## 2015-09-21 MED ORDER — LORAZEPAM 0.5 MG PO TABS: 0.5000 mg | ORAL_TABLET | Freq: Two times a day (BID) | ORAL | Status: DC | PRN

## 2015-09-21 MED ORDER — LOMUSTINE 100 MG PO CAPS: 200.0000 mg | ORAL_CAPSULE | Freq: Once | ORAL | Status: DC

## 2015-09-21 MED ORDER — LIDOCAINE-PRILOCAINE 2.5-2.5 % EX CREA: 1.0000 "application " | TOPICAL_CREAM | CUTANEOUS | Status: DC | PRN

## 2015-09-21 MED ORDER — BUTALBITAL-APAP-CAFFEINE 50-325-40 MG PO TABS: ORAL_TABLET | ORAL | Status: DC

## 2015-09-21 MED ORDER — SODIUM CHLORIDE 0.9 % IV SOLN
Freq: Once | INTRAVENOUS | Status: AC
  Administered 2015-09-21: 12:00:00 via INTRAVENOUS

## 2015-09-21 MED ORDER — LOMUSTINE 40 MG PO CAPS: 40.0000 mg | ORAL_CAPSULE | Freq: Once | ORAL | Status: DC

## 2015-09-21 MED ORDER — ONDANSETRON 8 MG PO TBDP: 8.0000 mg | ORAL_TABLET | Freq: Three times a day (TID) | ORAL | Status: DC | PRN

## 2015-09-21 MED ORDER — VINCRISTINE SULFATE CHEMO INJECTION 1 MG/ML
2.0000 mg | Freq: Once | INTRAVENOUS | Status: AC
  Administered 2015-09-21: 2 mg via INTRAVENOUS
  Filled 2015-09-21: qty 2

## 2015-09-21 MED ORDER — MORPHINE SULFATE 15 MG PO TABS: 15.0000 mg | ORAL_TABLET | Freq: Four times a day (QID) | ORAL | Status: DC | PRN

## 2015-09-21 MED ORDER — PALONOSETRON HCL INJECTION 0.25 MG/5ML
INTRAVENOUS | Status: AC
  Filled 2015-09-21: qty 5

## 2015-09-21 MED ORDER — SCOPOLAMINE 1 MG/3DAYS TD PT72: 1.0000 | MEDICATED_PATCH | TRANSDERMAL | Status: DC

## 2015-09-21 MED ORDER — PALONOSETRON HCL INJECTION 0.25 MG/5ML
0.2500 mg | Freq: Once | INTRAVENOUS | Status: AC
  Administered 2015-09-21: 0.25 mg via INTRAVENOUS

## 2015-09-21 MED FILL — GLEOSTINE 100 MG CAPSULE: 100 | 1 days supply | Qty: 2 | Fill #0

## 2015-09-21 MED FILL — MORPHINE SULFATE IR 15 MG T: 15 | 22 days supply | Qty: 90 | Fill #0

## 2015-09-21 MED FILL — ONDANSETRON ODT 8 MG TABLET: 8 | 30 days supply | Qty: 90 | Fill #0

## 2015-09-21 MED FILL — GLEOSTINE 40 MG CAPSULE: 40 | 1 days supply | Qty: 1 | Fill #0

## 2015-09-21 MED FILL — LORazepam 0.5 MG TABS: 0.5 | 30 days supply | Qty: 60 | Fill #0

## 2015-09-21 MED FILL — BUTALB-ACETAMIN-CAFF 50-325: 50-325-40 | 22 days supply | Qty: 90 | Fill #0

## 2015-09-21 NOTE — Telephone Encounter (Signed)
Opened in error

## 2015-09-21 NOTE — Patient Instructions (Signed)
[facility] Discharge Instructions for Patients Receiving Chemotherapy  Today you received the following chemotherapy agents; Vincristine.  To help prevent nausea and vomiting after your treatment, we encourage you to take your nausea medication:  Phenergan 25 mg every 6 hours as needed.   If you develop nausea and vomiting that is not controlled by your nausea medication, call the clinic.   BELOW ARE SYMPTOMS THAT SHOULD BE REPORTED IMMEDIATELY:  *FEVER GREATER THAN 100.5 F  *CHILLS WITH OR WITHOUT FEVER  NAUSEA AND VOMITING THAT IS NOT CONTROLLED WITH YOUR NAUSEA MEDICATION  *UNUSUAL SHORTNESS OF BREATH  *UNUSUAL BRUISING OR BLEEDING  TENDERNESS IN MOUTH AND THROAT WITH OR WITHOUT PRESENCE OF ULCERS  *URINARY PROBLEMS  *BOWEL PROBLEMS  UNUSUAL RASH Items with * indicate a potential emergency and should be followed up as soon as possible.  Feel free to call the clinic you have any questions or concerns. The clinic phone number is [phone].  Please show the [facility] at check-in to the Emergency Department and triage nurse.

## 2015-09-22 DIAGNOSIS — I878 Other specified disorders of veins: Secondary | ICD-10-CM | POA: Insufficient documentation

## 2015-09-22 NOTE — Assessment & Plan Note (Signed)
This is likely due to recent treatment. The patient denies recent history of bleeding such as epistaxis, hematuria or hematochezia. No recent infection. She is asymptomatic from the low platelet count and low white blood cell count. I will observe for now.  she does not require transfusion now. I will continue the chemotherapy at current dose without dosage adjustment.  If the pancytopenia gets progressive worse in the future, I might have to delay her treatment or adjust the chemotherapy dose.

## 2015-09-22 NOTE — Assessment & Plan Note (Signed)
Overall, she has persistent, recurrent seizures which appeared to be somewhat under control with her current medications. She will continue follow-up with her neurologist for medication adjustment.

## 2015-09-22 NOTE — Progress Notes (Signed)
[facility] OFFICE PROGRESS NOTE  Patient Care Team: Lavone Orn, MD as PCP - General (Internal Medicine) Ashok Pall, MD as Consulting Physician (Neurosurgery) Burnell Blanks. Salomon Fick, MD as Consulting Physician (Neurosurgery) Burnell Blanks. 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   09/27/2013 Pathology Results Accession: [id number] pathology showed grade 2 oligodendroglioma with mutant IDH1 protein, ATRX, OLIG 2 expression. p53 is negative, low Ki-67 labeling index. 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   11/06/2013 Imaging Repeat CT scan of the head showed resolution of abscess.   01/23/2014 Surgery Dr. Christella Noa perform a subtotal gross resection of the tumor.   01/24/2014 Imaging Repeat imaging studies showed persistent and residual disease.   03/03/2014 - 09/01/2014 Chemotherapy She was started on Temodar, 5 days every 28 days   03/13/2014 Imaging Repeat MRI showed no evidence of progression of disease.   07/14/2014 Imaging Repeat MRI showed no evidence of disease progression.   09/12/2014 Imaging MRI of the liver show mild regression of the size of lesions, presumed to be benign   09/15/2014 Imaging Repeat MRI of the brain show persistent abnormalities, suspicious for residual disease   03/02/2015 Imaging  MRI brain at [facility] show persistent abnormalities   08/10/2015 Imaging Repeat MRI [facility] showed stable disease   08/24/2015 -  Chemotherapy She was started on Lomustine, Procarbazine and Vincristine chemotherapy   08/30/2015 Imaging CT chest showed no evidence of aspiration pneumonia    INTERVAL HISTORY: Please see below for problem oriented charting. She returns for further follow-up. She had  multiple complaints since the last treatment. She has poorly controlled nausea and vomiting for a week after treatment. She complains of excessive fatigue. She also complained of soreness in her arm after last chemotherapy administration. She continued to have dysphagia but no major choking sensation Denies recent poorly controlled seizures She continues to have chronic headaches  REVIEW OF SYSTEMS:   Constitutional: Denies fevers, chills or abnormal weight loss Eyes: Denies blurriness of vision Ears, nose, mouth, throat, and face: Denies mucositis or sore throat Respiratory: Denies cough, dyspnea or wheezes Cardiovascular: Denies palpitation, chest discomfort or lower extremity swelling Skin: Denies abnormal skin rashes Lymphatics: Denies new lymphadenopathy or easy bruising Neurological:Denies numbness, tingling or new weaknesses Behavioral/Psych: Mood is stable, no new changes  All other systems were reviewed with the patient and are negative.  I have reviewed the past medical history, past surgical history, social history and family history with the patient and they are unchanged from previous note.  ALLERGIES:  is allergic to adhesive; latex; percocet; shellfish allergy; soma; tramadol hcl; and vancomycin.  MEDICATIONS:  Current Outpatient Prescriptions  Medication Sig Dispense Refill  . albuterol (PROVENTIL HFA;VENTOLIN HFA) 108 (90 BASE) MCG/ACT inhaler Inhale 2 puffs into the lungs every 6 (six) hours as needed for wheezing or shortness of breath. Reported on 08/11/2015    . ALPRAZolam (XANAX) 1 MG tablet Take 1 mg by mouth 2 (two) times daily as needed (for seizures). Reported on 08/11/2015    . butalbital-acetaminophen-caffeine (FIORICET, ESGIC) 50-325-40 MG tablet TAKE 1 TABLET BY MOUTH EVERY 6 HOURS AS NEEDED FOR HEADACE. 90 tablet 0  . carisoprodol (SOMA) 350 MG tablet Take 1 tablet (350 mg total) by mouth 4 (four) times daily.  For seizure prevention 90 tablet 5  .  cetirizine (ZYRTEC) 10 MG tablet Take 10 mg by mouth daily.    Marland Kitchen dicyclomine (BENTYL) 20 MG tablet Take 20 mg by mouth at bedtime as needed and may repeat dose one time if needed for spasms.    . divalproex (DEPAKOTE ER) 250 MG 24 hr tablet Take 2 tablets every am and 3 tablets every pm 450 tablet 1  . gabapentin (NEURONTIN) 300 MG capsule Take 3 capsules in the morning and 3 capsules in the evening 540 capsule 1  . HYDROcodone-acetaminophen (NORCO) 10-325 MG tablet Take 1 tablet by mouth every 6 (six) hours as needed. 90 tablet 0  . lomustine (CEENU) 100 MG capsule Take 2 capsules (200 mg total) by mouth once. Take on an emtpy stomach 1 hour before or 2 hours after meals. Caution:chemotherapy 2 capsule 0  . lomustine (CEENU) 40 MG capsule Take 1 capsule (40 mg total) by mouth once. Take on an empty stomach 1 hour before or 2 hours after meals. Caution:chemotherapy. 1 capsule 0  . LORazepam (ATIVAN) 0.5 MG tablet Take 1 tablet (0.5 mg total) by mouth 2 (two) times daily as needed for anxiety. 60 tablet 0  . morphine (MSIR) 15 MG tablet Take 1 tablet (15 mg total) by mouth every 6 (six) hours as needed for severe pain. 90 tablet 0  . procarbazine (MATULANE) 50 MG capsule Take 2 capsules (100 mg total) by mouth daily. Follow low tyramine diet. Take for 14 days starting on Day 8. Repeat every 6 weeks 28 capsule 0  . zolmitriptan (ZOMIG) 5 MG tablet Take 5 mg by mouth as needed for migraine.    . lidocaine-prilocaine (EMLA) cream Apply 1 application topically as needed. 30 g 6  . ondansetron (ZOFRAN ODT) 8 MG disintegrating tablet Take 1 tablet (8 mg total) by mouth every 8 (eight) hours as needed for nausea or vomiting. 90 tablet 3  . ondansetron (ZOFRAN) 4 MG tablet Take 4 mg by mouth every 8 (eight) hours as needed for nausea or vomiting. Reported on 09/21/2015    . promethazine (PHENERGAN) 25 MG tablet Take 25 mg by mouth every 6 (six) hours as needed for nausea or vomiting. Reported on 09/21/2015    .  scopolamine (TRANSDERM-SCOP) 1 MG/3DAYS Place 1 patch (1.5 mg total) onto the skin every 3 (three) days. 10 patch 12   No current facility-administered medications for this visit.    PHYSICAL EXAMINATION: ECOG PERFORMANCE STATUS: 1 - Symptomatic but completely ambulatory  Filed Vitals:   09/21/15 1041  BP: 114/65  Pulse: 82  Temp: 98.3 F (36.8 C)  Resp: 18   Filed Weights   09/21/15 1041  Weight: 168 lb 12.8 oz (76.567 kg)    GENERAL:alert, no distress and comfortable SKIN: skin color, texture, turgor are normal, no rashes or significant lesions EYES: normal, Conjunctiva are pink and non-injected, sclera clear OROPHARYNX:no exudate, no erythema and lips, buccal mucosa, and tongue normal  NECK: supple, thyroid normal size, non-tender, without nodularity LYMPH:  no palpable lymphadenopathy in the cervical, axillary or inguinal LUNGS: clear to auscultation and percussion with normal breathing effort HEART: regular rate & rhythm and no murmurs and no lower extremity edema ABDOMEN:abdomen soft, non-tender and normal bowel sounds Musculoskeletal:no cyanosis of digits and no clubbing  NEURO: alert & oriented x 3 with fluent speech, no focal motor/sensory deficits  LABORATORY DATA:  I have reviewed the data as listed    Component Value Date/Time  NA 140 09/21/2015 1020   NA 142 08/30/2015 0105   K 4.1 09/21/2015 1020   K 3.3* 08/30/2015 0105   CL 105 08/30/2015 0105   CO2 24 09/21/2015 1020   CO2 23 08/30/2015 0105   GLUCOSE 75 09/21/2015 1020   GLUCOSE 110* 08/30/2015 0105   BUN 11.3 09/21/2015 1020   BUN 7 08/30/2015 0105   CREATININE 0.7 09/21/2015 1020   CREATININE 0.63 08/30/2015 0105   CALCIUM 8.9 09/21/2015 1020   CALCIUM 9.3 08/30/2015 0105   PROT 7.0 09/21/2015 1020   PROT 6.7 08/30/2015 0105   ALBUMIN 4.1 09/21/2015 1020   ALBUMIN 4.3 08/30/2015 0105   AST 17 09/21/2015 1020   AST 22 08/30/2015 0105   ALT 14 09/21/2015 1020   ALT 30 08/30/2015 0105    ALKPHOS 40 09/21/2015 1020   ALKPHOS 38 08/30/2015 0105   BILITOT 0.37 09/21/2015 1020   BILITOT 0.1* 08/30/2015 0105   GFRNONAA >60 08/30/2015 0105   GFRAA >60 08/30/2015 0105    No results found for: SPEP, UPEP  Lab Results  Component Value Date   WBC 3.4* 09/21/2015   NEUTROABS 1.8 09/21/2015   HGB 12.5 09/21/2015   HCT 36.1 09/21/2015   MCV 92.6 09/21/2015   PLT 111* 09/21/2015      Chemistry      Component Value Date/Time   NA 140 09/21/2015 1020   NA 142 08/30/2015 0105   K 4.1 09/21/2015 1020   K 3.3* 08/30/2015 0105   CL 105 08/30/2015 0105   CO2 24 09/21/2015 1020   CO2 23 08/30/2015 0105   BUN 11.3 09/21/2015 1020   BUN 7 08/30/2015 0105   CREATININE 0.7 09/21/2015 1020   CREATININE 0.63 08/30/2015 0105      Component Value Date/Time   CALCIUM 8.9 09/21/2015 1020   CALCIUM 9.3 08/30/2015 0105   ALKPHOS 40 09/21/2015 1020   ALKPHOS 38 08/30/2015 0105   AST 17 09/21/2015 1020   AST 22 08/30/2015 0105   ALT 14 09/21/2015 1020   ALT 30 08/30/2015 0105   BILITOT 0.37 09/21/2015 1020   BILITOT 0.1* 08/30/2015 0105     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 I will continue supportive care and medication adjustment. I plan to repeat MRI after 2 cycles of chemotherapy. I will see her back in 3 weeks for further management  Pancytopenia due to antineoplastic chemotherapy [facility]) This is likely due to recent treatment. The patient denies recent history of bleeding such as epistaxis, hematuria or hematochezia. No recent infection. She is asymptomatic from the low platelet count and low white blood cell count. I will observe for now.  she does not require transfusion now. I will continue the chemotherapy at current dose without dosage adjustment.  If the pancytopenia gets progressive worse in the future, I  might have to delay her treatment or adjust the chemotherapy dose.    Simple partial seizures Overall, she has persistent, recurrent seizures which appeared to be somewhat under control with her current medications. She will continue follow-up with her neurologist for medication adjustment.   Nausea & vomiting She has uncontrolled nausea and vomiting. I will change her premedication to Aloxi and gave her ODT Zofran. After discussion with pharmacist, I will also prescribe scopolamine patch as needed for uncontrolled nausea and vomiting  Poor venous access She has poor  venous access with burning pain after prior chemotherapy. I recommended port placement and she agreed to proceed  Intractable chronic cluster headache She has chronic headache which is multifactorial, related to prior resection with neuropathic pain component We will continue conservative management with her current medications.  I refill multiple prescriptions      Orders Placed This Encounter  Procedures  . IR Fluoro Guide CV Line Right    Indicate type of CVC ordering    Standing Status: Future     Number of Occurrences:      Standing Expiration Date: 11/20/2016    Order Specific Question:  Reason for exam:    Answer:  need port for chemo and IV access    Order Specific Question:  Is the patient pregnant?    Answer:  No    Order Specific Question:  Preferred Imaging Location?    Answer:  [facility]   All questions were answered. The patient knows to call the clinic with any problems, questions or concerns. No barriers to learning was detected. I spent 30 minutes counseling the patient face to face. The total time spent in the appointment was 40 minutes and more than 50% was on counseling and review of test results     [facility], [place], MD 09/22/2015 7:15 AM

## 2015-09-22 NOTE — Assessment & Plan Note (Signed)
She tolerated treatment poorly due to uncontrollable nausea and vomiting. I will continue supportive care and medication adjustment. I plan to repeat MRI after 2 cycles of chemotherapy. I will see her back in 3 weeks for further management

## 2015-09-22 NOTE — Assessment & Plan Note (Signed)
She has uncontrolled nausea and vomiting. I will change her premedication to Aloxi and gave her ODT Zofran. After discussion with pharmacist, I will also prescribe scopolamine patch as needed for uncontrolled nausea and vomiting

## 2015-09-22 NOTE — Assessment & Plan Note (Signed)
She has poor venous access with burning pain after prior chemotherapy. I recommended port placement and she agreed to proceed

## 2015-09-22 NOTE — Assessment & Plan Note (Signed)
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  I refill multiple prescriptions

## 2015-09-25 ENCOUNTER — Telehealth: Payer: Self-pay | Admitting: *Deleted

## 2015-09-25 NOTE — Telephone Encounter (Signed)
Pt was hesitant to Schedule PAC placement.  Discussed at length with patient pros and cons of PAC.  Encouraged her to get [facility] for IV Vesicant Chemotherapy.  Pt has poor veins for peripheral access.  They are difficult to find and "blow" easily and this makes it even more precarious for RN to administer IV.  Informed her it is much safer to give Vincristine via a central line.  Pt may only have a few treatments of Vincristine left at this time, but PAC can be useful in the future.   Explained to pt PAC can be used for blood draws and CT scans/ MRIs as well.   She agreed and will schedule her PAC placement for 3/17. POF sent to add Flush appt on 3/27.   Offered to order EMLA cream,  But pt says she doesn't think Medicaid will pay for it.  She has some type of topical numbing agent at home she wants to try (used to numb skin prior to laser treatments).  She will look at the tube but thinks it is similar to EMLA w/ lidocaine and prilocaine.

## 2015-09-29 ENCOUNTER — Other Ambulatory Visit: Payer: Self-pay | Admitting: Radiology

## 2015-09-30 ENCOUNTER — Other Ambulatory Visit: Payer: Self-pay | Admitting: Radiology

## 2015-09-30 MED FILL — LIDOCAINE-PRILOCAINE CREAM: 2.5-2.5 | 10 days supply | Qty: 30 | Fill #0

## 2015-09-30 MED FILL — TRANSDERM-SCOP 1.5 MG/3 DAY: 1 | 30 days supply | Qty: 10 | Fill #0

## 2015-10-01 ENCOUNTER — Other Ambulatory Visit: Payer: Self-pay | Admitting: Radiology

## 2015-10-01 ENCOUNTER — Telehealth: Payer: Self-pay | Admitting: *Deleted

## 2015-10-01 MED ORDER — HYDROCODONE-ACETAMINOPHEN 10-325 MG PO TABS: 1.0000 | ORAL_TABLET | Freq: Four times a day (QID) | ORAL | Status: DC | PRN

## 2015-10-01 NOTE — Telephone Encounter (Signed)
LVM for pt and also informed husband of Rx for Hydrocodone ready to pick up.

## 2015-10-02 ENCOUNTER — Ambulatory Visit (HOSPITAL_COMMUNITY)
Admission: RE | Admit: 2015-10-02 | Discharge: 2015-10-02 | Disposition: A | Discharge status: Home / Self Care | Payer: Medicaid Other | Source: Ambulatory Visit | Attending: Hematology and Oncology | Admitting: Hematology and Oncology

## 2015-10-02 ENCOUNTER — Encounter (HOSPITAL_COMMUNITY): Payer: Self-pay

## 2015-10-02 ENCOUNTER — Other Ambulatory Visit: Payer: Self-pay | Admitting: Hematology and Oncology

## 2015-10-02 ENCOUNTER — Telehealth: Payer: Self-pay | Admitting: *Deleted

## 2015-10-02 ENCOUNTER — Other Ambulatory Visit: Payer: Self-pay | Admitting: *Deleted

## 2015-10-02 DIAGNOSIS — Z885 Allergy status to narcotic agent status: Secondary | ICD-10-CM | POA: Insufficient documentation

## 2015-10-02 DIAGNOSIS — Z79899 Other long term (current) drug therapy: Secondary | ICD-10-CM | POA: Diagnosis not present

## 2015-10-02 DIAGNOSIS — Z91040 Latex allergy status: Secondary | ICD-10-CM | POA: Insufficient documentation

## 2015-10-02 DIAGNOSIS — Z881 Allergy status to other antibiotic agents status: Secondary | ICD-10-CM | POA: Diagnosis not present

## 2015-10-02 DIAGNOSIS — C711 Malignant neoplasm of frontal lobe: Secondary | ICD-10-CM | POA: Diagnosis present

## 2015-10-02 DIAGNOSIS — Z888 Allergy status to other drugs, medicaments and biological substances status: Secondary | ICD-10-CM | POA: Diagnosis not present

## 2015-10-02 DIAGNOSIS — Z8041 Family history of malignant neoplasm of ovary: Secondary | ICD-10-CM | POA: Diagnosis not present

## 2015-10-02 DIAGNOSIS — Z803 Family history of malignant neoplasm of breast: Secondary | ICD-10-CM | POA: Insufficient documentation

## 2015-10-02 DIAGNOSIS — D61818 Other pancytopenia: Secondary | ICD-10-CM | POA: Diagnosis not present

## 2015-10-02 DIAGNOSIS — G40409 Other generalized epilepsy and epileptic syndromes, not intractable, without status epilepticus: Secondary | ICD-10-CM | POA: Diagnosis not present

## 2015-10-02 DIAGNOSIS — Z808 Family history of malignant neoplasm of other organs or systems: Secondary | ICD-10-CM | POA: Insufficient documentation

## 2015-10-02 LAB — PROTIME-INR
INR: 0.89 (ref 0.00–1.49)
Prothrombin Time: 12.3 seconds (ref 11.6–15.2)

## 2015-10-02 LAB — BASIC METABOLIC PANEL
Anion gap: 9 (ref 5–15)
BUN: 9 mg/dL (ref 6–20)
CO2: 27 mmol/L (ref 22–32)
Calcium: 9.3 mg/dL (ref 8.9–10.3)
Chloride: 105 mmol/L (ref 101–111)
Creatinine, Ser: 0.6 mg/dL (ref 0.44–1.00)
GFR calc Af Amer: >60 mL/min (ref >60)
GFR calc non Af Amer: >60 mL/min (ref >60)
Glucose, Bld: 84 mg/dL (ref 65–99)
Potassium: 3.9 mmol/L (ref 3.5–5.1)
Sodium: 141 mmol/L (ref 135–145)

## 2015-10-02 LAB — CBC
HCT: 35.6 % — ABNORMAL LOW (ref 36.0–46.0)
Hemoglobin: 12.3 g/dL (ref 12.0–15.0)
MCH: 32.6 pg (ref 26.0–34.0)
MCHC: 34.6 g/dL (ref 30.0–36.0)
MCV: 94.4 fL (ref 78.0–100.0)
Platelets: 120 10*3/uL — ABNORMAL LOW (ref 150–400)
RBC: 3.77 MIL/uL — ABNORMAL LOW (ref 3.87–5.11)
RDW: 13.2 % (ref 11.5–15.5)
WBC: 2.9 10*3/uL — ABNORMAL LOW (ref 4.0–10.5)

## 2015-10-02 LAB — APTT: aPTT: 28 seconds (ref 24–37)

## 2015-10-02 MED ORDER — CEFAZOLIN SODIUM-DEXTROSE 2-3 GM-% IV SOLR
INTRAVENOUS | Status: AC
  Filled 2015-10-02: qty 50

## 2015-10-02 MED ORDER — MIDAZOLAM HCL 2 MG/2ML IJ SOLN
INTRAMUSCULAR | Status: AC
  Filled 2015-10-02: qty 6

## 2015-10-02 MED ORDER — LIDOCAINE HCL 1 % IJ SOLN
INTRAMUSCULAR | Status: AC
  Filled 2015-10-02: qty 20

## 2015-10-02 MED ORDER — SODIUM CHLORIDE 0.9 % IV SOLN
Freq: Once | INTRAVENOUS | Status: AC
  Administered 2015-10-02: 12:00:00 via INTRAVENOUS

## 2015-10-02 MED ORDER — HEPARIN SOD (PORK) LOCK FLUSH 100 UNIT/ML IV SOLN
INTRAVENOUS | Status: AC
  Filled 2015-10-02: qty 5

## 2015-10-02 MED ORDER — PROCARBAZINE HCL 50 MG PO CAPS: 100.0000 mg | ORAL_CAPSULE | Freq: Every day | ORAL | Status: DC

## 2015-10-02 MED ORDER — MIDAZOLAM HCL 2 MG/2ML IJ SOLN
INTRAMUSCULAR | Status: AC
  Filled 2015-10-02: qty 4

## 2015-10-02 MED ORDER — MIDAZOLAM HCL 2 MG/2ML IJ SOLN
INTRAMUSCULAR | Status: AC | PRN
  Administered 2015-10-02 (×3): 1 mg via INTRAVENOUS
  Administered 2015-10-02: 2 mg via INTRAVENOUS

## 2015-10-02 MED ORDER — FENTANYL CITRATE (PF) 100 MCG/2ML IJ SOLN
INTRAMUSCULAR | Status: AC
  Filled 2015-10-02: qty 4

## 2015-10-02 MED ORDER — LIDOCAINE-EPINEPHRINE 2 %-1:100000 IJ SOLN
INTRAMUSCULAR | Status: AC
  Filled 2015-10-02: qty 1

## 2015-10-02 MED ORDER — HEPARIN SOD (PORK) LOCK FLUSH 100 UNIT/ML IV SOLN
INTRAVENOUS | Status: AC | PRN
  Administered 2015-10-02: 500 [IU] via INTRAVENOUS

## 2015-10-02 MED ORDER — CEFAZOLIN SODIUM-DEXTROSE 2-3 GM-% IV SOLR
2.0000 g | INTRAVENOUS | Status: DC
Start: 2015-10-02 — End: 2015-10-03

## 2015-10-02 MED ORDER — FENTANYL CITRATE (PF) 100 MCG/2ML IJ SOLN
INTRAMUSCULAR | Status: AC | PRN
  Administered 2015-10-02 (×2): 50 ug via INTRAVENOUS
  Administered 2015-10-02 (×2): 25 ug via INTRAVENOUS

## 2015-10-02 NOTE — Progress Notes (Addendum)
Patient in short stay for portacath placement. Patient stated had prior reaction to post op skin adhesive dermabond (redness and irritation). Requested it not be used for port today. Informed Rowe Robert PA who is here to assess patient. She still has soreness in right arm from prior ?periperal iv chemo extravasation. Patient went to cancer center yesterday and had former peripheral iv site/right arm assessed by infusion nurse. Chemo nurse stated not to use today for peripheral iv. Pink restricted armband placed on patient's right arm to not use for iv/bp today.

## 2015-10-02 NOTE — Telephone Encounter (Signed)
Pt left VM asking if ok to start CEENU on Monday even though her WBC is a little low?  Per Dr. Alvy Bimler ok to start CCNU Monday,  She is aware of WBC count.  Pt also asks for assistance in getting her Procarbazine refilled from [place].  I LVM for Montel Clock to ask if anything special needs to be done for this refill or is it ok for RN to send refill with Dr. Calton Dach order?   Called pt back and left her a VM informing ok to start CEENU on Monday in spite of low WBC per Dr. Alvy Bimler. Informed her I will f/u w/ Montel Clock regarding refill Procarbazine.

## 2015-10-02 NOTE — Discharge Instructions (Signed)
Implanted Port Home Guide °An implanted port is a type of central line that is placed under the skin. Central lines are used to provide IV access when treatment or nutrition needs to be given through a person's veins. Implanted ports are used for long-term IV access. An implanted port may be placed because:  °· You need IV medicine that would be irritating to the small veins in your hands or arms.   °· You need long-term IV medicines, such as antibiotics.   °· You need IV nutrition for a long period.   °· You need frequent blood draws for lab tests.   °· You need dialysis.   °Implanted ports are usually placed in the chest area, but they can also be placed in the upper arm, the abdomen, or the leg. An implanted port has two main parts:  °· Reservoir. The reservoir is round and will appear as a small, raised area under your skin. The reservoir is the part where a needle is inserted to give medicines or draw blood.   °· Catheter. The catheter is a thin, flexible tube that extends from the reservoir. The catheter is placed into a large vein. Medicine that is inserted into the reservoir goes into the catheter and then into the vein.   °HOW WILL I CARE FOR MY INCISION SITE? °Do not get the incision site wet. Bathe or shower as directed by your health care provider.  °HOW IS MY PORT ACCESSED? °Special steps must be taken to access the port:  °· Before the port is accessed, a numbing cream can be placed on the skin. This helps numb the skin over the port site.   °· Your health care provider uses a sterile technique to access the port. °· Your health care provider must put on a mask and sterile gloves. °· The skin over your port is cleaned carefully with an antiseptic and allowed to dry. °· The port is gently pinched between sterile gloves, and a needle is inserted into the port. °· Only "non-coring" port needles should be used to access the port. Once the port is accessed, a blood return should be checked. This helps  ensure that the port is in the vein and is not clogged.   °· If your port needs to remain accessed for a constant infusion, a clear (transparent) bandage will be placed over the needle site. The bandage and needle will need to be changed every week, or as directed by your health care provider.   °· Keep the bandage covering the needle clean and dry. Do not get it wet. Follow your health care provider's instructions on how to take a shower or bath while the port is accessed.   °· If your port does not need to stay accessed, no bandage is needed over the port.   °WHAT IS FLUSHING? °Flushing helps keep the port from getting clogged. Follow your health care provider's instructions on how and when to flush the port. Ports are usually flushed with saline solution or a medicine called heparin. The need for flushing will depend on how the port is used.  °· If the port is used for intermittent medicines or blood draws, the port will need to be flushed:   °· After medicines have been given.   °· After blood has been drawn.   °· As part of routine maintenance.   °· If a constant infusion is running, the port may not need to be flushed.   °HOW LONG WILL MY PORT STAY IMPLANTED? °The port can stay in for as long as your health care   provider thinks it is needed. When it is time for the port to come out, surgery will be done to remove it. The procedure is similar to the one performed when the port was put in.  °WHEN SHOULD I SEEK IMMEDIATE MEDICAL CARE? °When you have an implanted port, you should seek immediate medical care if:  °· You notice a bad smell coming from the incision site.   °· You have swelling, redness, or drainage at the incision site.   °· You have more swelling or pain at the port site or the surrounding area.   °· You have a fever that is not controlled with medicine. °  °This information is not intended to replace advice given to you by your health care provider. Make sure you discuss any questions you have with  your health care provider. °  °Document Released: 07/04/2005 Document Revised: 04/24/2013 Document Reviewed: 03/11/2013 °Elsevier Interactive Patient Education ©2016 [employer] °Implanted Port Insertion, Care After °Refer to this sheet in the next few weeks. These instructions provide you with information on caring for yourself after your procedure. Your health care provider may also give you more specific instructions. Your treatment has been planned according to current medical practices, but problems sometimes occur. Call your health care provider if you have any problems or questions after your procedure. °WHAT TO EXPECT AFTER THE PROCEDURE °After your procedure, it is typical to have the following:  °· Discomfort at the port insertion site. Ice packs to the area will help. °· Bruising on the skin over the port. This will subside in 3-4 days. °HOME CARE INSTRUCTIONS °· After your port is placed, you will get a manufacturer's information card. The card has information about your port. Keep this card with you at all times.   °· Know what kind of port you have. There are many types of ports available.   °· Wear a medical alert bracelet in case of an emergency. This can help alert health care workers that you have a port.   °· The port can stay in for as long as your health care provider believes it is necessary.   °· A home health care nurse may give medicines and take care of the port.   °· You or a family member can get special training and directions for giving medicine and taking care of the port at home.   °SEEK MEDICAL CARE IF:  °· Your port does not flush or you are unable to get a blood return.   °· You have a fever or chills. °SEEK IMMEDIATE MEDICAL CARE IF: °· You have new fluid or pus coming from your incision.   °· You notice a bad smell coming from your incision site.   °· You have swelling, pain, or more redness at the incision or port site.   °· You have chest pain or shortness of breath. °  °This  information is not intended to replace advice given to you by your health care provider. Make sure you discuss any questions you have with your health care provider. °  °Document Released: 04/24/2013 Document Revised: 07/09/2013 Document Reviewed: 04/24/2013 °Elsevier Interactive Patient Education ©2016 [employer] °Moderate Conscious Sedation, Adult, Care After °Refer to this sheet in the next few weeks. These instructions provide you with information on caring for yourself after your procedure. Your health care provider may also give you more specific instructions. Your treatment has been planned according to current medical practices, but problems sometimes occur. Call your health care provider if you have any problems or questions after your   procedure. °WHAT TO EXPECT AFTER THE PROCEDURE  °After your procedure: °· You may feel sleepy, clumsy, and have poor balance for several hours. °· Vomiting may occur if you eat too soon after the procedure. °HOME CARE INSTRUCTIONS °· Do not participate in any activities where you could become injured for at least 24 hours. Do not: °¨ Drive. °¨ Swim. °¨ Ride a bicycle. °¨ Operate heavy machinery. °¨ Cook. °¨ Use power tools. °¨ Climb ladders. °¨ Work from a high place. °· Do not make important decisions or sign legal documents until you are improved. °· If you vomit, drink water, juice, or soup when you can drink without vomiting. Make sure you have little or no nausea before eating solid foods. °· Only take over-the-counter or prescription medicines for pain, discomfort, or fever as directed by your health care provider. °· Make sure you and your family fully understand everything about the medicines given to you, including what side effects may occur. °· You should not drink alcohol, take sleeping pills, or take medicines that cause drowsiness for at least 24 hours. °· If you smoke, do not smoke without supervision. °· If you are feeling better, you may resume normal  activities 24 hours after you were sedated. °· Keep all appointments with your health care provider. °SEEK MEDICAL CARE IF: °· Your skin is pale or bluish in color. °· You continue to feel nauseous or vomit. °· Your pain is getting worse and is not helped by medicine. °· You have bleeding or swelling. °· You are still sleepy or feeling clumsy after 24 hours. °SEEK IMMEDIATE MEDICAL CARE IF: °· You develop a rash. °· You have difficulty breathing. °· You develop any type of allergic problem. °· You have a fever. °MAKE SURE YOU: °· Understand these instructions. °· Will watch your condition. °· Will get help right away if you are not doing well or get worse. °  °This information is not intended to replace advice given to you by your health care provider. Make sure you discuss any questions you have with your health care provider. °  °Document Released: 04/24/2013 Document Revised: 07/25/2014 Document Reviewed: 04/24/2013 °Elsevier Interactive Patient Education ©2016 [employer]. ° °

## 2015-10-02 NOTE — H&P (Signed)
Chief Complaint: Patient was seen in consultation today for port a cath placement  Referring Physician(s): [place]  Supervising Physician: Corrie Mckusick  History of Present Illness: Victoria Bates is a 35 y.o. 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  Past Medical History  Diagnosis Date  . Liver hemangioma   . Anemia   . Headache(784.0)     migraines  . Anxiety   . Asthma     very mild- no inhaler use since July 2013  . Complication of anesthesia     takes longer for patient to go under anesthesia  . Brain tumor ([place])     Hx: of  . Infection     to right head incision  . Petit mal seizure status ([place])   . Dizziness   . Seizures ([place])     petit mal serizures most days  . Herniated disc   . Nausea alone 02/12/2014  . Dysphasia 02/12/2014  . Headache 02/12/2014  . Neuropathic pain 04/08/2014  . Liver lesion 08/22/2014  . 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 Chronic fatigue 10/03/2014  . Hot flashes 10/03/2014  . Cancer ([place]) 2003    skin  . Glioma, malignant ([place]) dx'd 09/2013  . Cognitive impairment 08/13/2015    Past Surgical History  Procedure Laterality Date  . Cesarean section    . Coposcopy  08/06/2012    cervical biopsy  . Left knee surgery  2000    torn meniscus  . Right flank surgery  2005    bnign mass from flank area-right  . Esophagogastroduodenoscopy (egd) with propofol N/A 08/29/2012    Procedure: ESOPHAGOGASTRODUODENOSCOPY (EGD) WITH PROPOFOL;  Surgeon: Arta Silence, MD;  Location: [facility];  Service: Endoscopy;  Laterality: N/A;  . Colonoscopy with propofol N/A 08/29/2012    Procedure: COLONOSCOPY WITH PROPOFOL;  Surgeon: Arta Silence, MD;  Location: [facility];  Service: Endoscopy;  Laterality: N/A;  . Wisdom tooth extraction    . Robotic assisted  total hysterectomy with bilateral salpingo oopherectomy Bilateral 09/25/2012    Procedure: ROBOTIC ASSISTED TOTAL HYSTERECTOMY WITH BILATERAL SALPINGO OOPHORECTOMY;  Surgeon: Maeola Sarah. Landry Mellow, MD;  Location: [facility];  Service: Gynecology;  Laterality: Bilateral;  . Abdominal hysterectomy    . Craniotomy Right 09/26/2013    Procedure: Awake Right frontal open brain biopsy;  Surgeon: Winfield Cunas, MD;  Location: [facility];  Service: Neurosurgery;  Laterality: Right;  awake Right frontal open brain biopsy  . Craniotomy N/A 10/30/2013    Procedure: debridement of cranial wound  right temporal incision, ;  Surgeon: Winfield Cunas, MD;  Location: [facility];  Service: Neurosurgery;  Laterality: N/A;  . Craniotomy N/A 01/23/2014    Procedure: Awake Craniotomy with BrainLab;  Surgeon: Winfield Cunas, MD;  Location: [facility];  Service: Neurosurgery;  Laterality: N/A;  Awake Craniotomy  tumor with BrainLab  . Rotator cuff repair      Allergies: Adhesive; Latex; Percocet; Shellfish allergy; Skin adhesives; Soma; Tramadol hcl; and Vancomycin  Medications: Prior to Admission medications   Medication Sig Start Date End Date Taking? Authorizing Provider  albuterol (PROVENTIL HFA;VENTOLIN HFA) 108 (90 BASE) MCG/ACT inhaler Inhale 2 puffs into the lungs every 6 (six) hours as needed for wheezing or shortness of breath. Reported on 08/11/2015    Historical Provider, MD  ALPRAZolam [name] 1 MG tablet Take 1  mg by mouth 2 (two) times daily as needed (for seizures). Reported on 08/11/2015    Historical Provider, MD  butalbital-acetaminophen-caffeine (FIORICET, ESGIC) 50-325-40 MG tablet TAKE 1 TABLET BY MOUTH EVERY 6 HOURS AS NEEDED FOR HEADACE. 09/21/15   Heath Lark, MD  carisoprodol (SOMA) 350 MG tablet Take 1 tablet (350 mg total) by mouth 4 (four) times daily. For seizure prevention 07/21/15   Heath Lark, MD  cetirizine (ZYRTEC) 10 MG tablet Take 10 mg by mouth daily.    Historical Provider, MD  dicyclomine (BENTYL) 20  MG tablet Take 20 mg by mouth at bedtime as needed and may repeat dose one time if needed for spasms.    Historical Provider, MD  divalproex (DEPAKOTE ER) 250 MG 24 hr tablet Take 2 tablets every am and 3 tablets every pm 04/16/15   Cameron Sprang, MD  gabapentin (NEURONTIN) 300 MG capsule Take 3 capsules in the morning and 3 capsules in the evening 04/16/15   Cameron Sprang, MD  HYDROcodone-acetaminophen [facility]) 10-325 MG tablet Take 1 tablet by mouth every 6 (six) hours as needed. 10/01/15   Heath Lark, MD  lidocaine-prilocaine (EMLA) cream Apply 1 application topically as needed. 09/21/15   Heath Lark, MD  lomustine (CEENU) 100 MG capsule Take 2 capsules (200 mg total) by mouth once. Take on an emtpy stomach 1 hour before or 2 hours after meals. Caution:chemotherapy 09/21/15   Heath Lark, MD  lomustine (CEENU) 40 MG capsule Take 1 capsule (40 mg total) by mouth once. Take on an empty stomach 1 hour before or 2 hours after meals. Caution:chemotherapy. 09/21/15   Heath Lark, MD  LORazepam (ATIVAN) 0.5 MG tablet Take 1 tablet (0.5 mg total) by mouth 2 (two) times daily as needed for anxiety. 09/21/15   Heath Lark, MD  morphine (MSIR) 15 MG tablet Take 1 tablet (15 mg total) by mouth every 6 (six) hours as needed for severe pain. 09/21/15   Heath Lark, MD  ondansetron (ZOFRAN ODT) 8 MG disintegrating tablet Take 1 tablet (8 mg total) by mouth every 8 (eight) hours as needed for nausea or vomiting. 09/21/15   Heath Lark, MD  ondansetron (ZOFRAN) 4 MG tablet Take 4 mg by mouth every 8 (eight) hours as needed for nausea or vomiting. Reported on 09/21/2015    Historical Provider, MD  procarbazine (MATULANE) 50 MG capsule Take 2 capsules (100 mg total) by mouth daily. Follow low tyramine diet. Take for 14 days starting on Day 8. Repeat every 6 weeks 08/26/15   Heath Lark, MD  promethazine (PHENERGAN) 25 MG tablet Take 25 mg by mouth every 6 (six) hours as needed for nausea or vomiting. Reported on 09/21/2015    Historical  Provider, MD  scopolamine (TRANSDERM-SCOP) 1 MG/3DAYS Place 1 patch (1.5 mg total) onto the skin every 3 (three) days. 09/21/15   Heath Lark, MD  zolmitriptan (ZOMIG) 5 MG tablet Take 5 mg by mouth as needed for migraine.    Historical Provider, MD     Family History  Problem Relation Age of Onset  . Hypertension Mother   . Skin cancer Mother   . Thyroid cancer Father   . Skin cancer Father   . Breast cancer Maternal Aunt     dx in her 29s  . Prostate cancer Maternal Uncle 63  . Melanoma Maternal Uncle   . Ovarian cancer Maternal Grandmother     dx in her late 56s-40s  . Breast cancer Maternal Aunt  bilateral breast cancer dx in 60s and 40s; possibly BRCA+  . Other Son     multiple CAL spots, bright spots on brain MRI, ? chiari malformation    Social History   Social History  . Marital Status: Single    Spouse Name: N/A  . Number of Children: 2  . Years of Education: College   Occupational History  . laser tech    Social History Main Topics  . Smoking status: Never Smoker   . Smokeless tobacco: Never Used  . Alcohol Use: No  . Drug Use: No  . Sexual Activity: Yes    Birth Control/ Protection: None   Other Topics Concern  . None   Social History Narrative   Patient lives at home with her two children.   Caffeine use: minimum of 4 cups daily      Review of Systems  Constitutional: Positive for fatigue. Negative for fever and chills.  HENT: Positive for trouble swallowing.   Respiratory: Negative for shortness of breath.        Occ cough  Cardiovascular: Negative for chest pain.  Gastrointestinal: Negative for nausea, vomiting, abdominal pain and blood in stool.  Genitourinary: Negative for dysuria and hematuria.  Musculoskeletal: Negative for back pain.       Rt arm discomfort from recent venous infusion/poss extrav of chemo agent  Neurological:       Occ HA's; hx seizures  Psychiatric/Behavioral: The patient is nervous/anxious.     Vital Signs: BP  145/82 mmHg  Pulse 82  Temp(Src) 98.2 F (36.8 C)  Resp 18  Ht _0  (1.753 m)  Wt 168 lb 12.8 oz (76.567 kg)  BMI 24.92 kg/m2  SpO2 100%  LMP 09/20/2012  Physical Exam awake, alert. Chest clear to auscultation bilaterally. Heart with regular rate and rhythm. abd soft, positive bowel sounds, nontender. Lower extremities with no edema.  Mallampati Score:     Imaging: No results found.  Labs:  CBC:  Recent Labs  08/30/15 0105 08/31/15 1305 09/21/15 1019 10/02/15 1215  WBC 4.8 3.5* 3.4* 2.9*  HGB 12.7 12.0 12.5 12.3  HCT 37.0 35.5 36.1 35.6*  PLT 157 145 111* 120*    COAGS: No results for input(s): INR, APTT in the last 8760 hours.  BMP:  Recent Labs  08/30/15 0105 08/31/15 1305 09/21/15 1020 10/02/15 1215  NA 142 141 140 141  K 3.3* 4.3 4.1 3.9  CL 105  --   --  105  CO2 _1 GLUCOSE 110* 76 75 84  BUN 7 5.5* 11.3 9  CALCIUM 9.3 9.0 8.9 9.3  CREATININE 0.63 0.7 0.7 0.60  GFRNONAA >60  --   --  >60  GFRAA >60  --   --  >60    LIVER FUNCTION TESTS:  Recent Labs  08/11/15 1018 08/30/15 0105 08/31/15 1305 09/21/15 1020  BILITOT 0.35 0.1* 0.48 0.37  AST _2 ALT _3 ALKPHOS 45 38 40 40  PROT 7.3 6.7 6.8 7.0  ALBUMIN 4.3 4.3 4.0 4.1    TUMOR MARKERS: No results for input(s): AFPTM, CEA, CA199, CHROMGRNA in the last 8760 hours.  Assessment and Plan: 35 y.o. 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 Alvy Bimler  prior to port placement.     Thank you for this interesting consult.  I greatly enjoyed meeting Victoria Bates and look forward to participating in their care.  A copy of this report was sent to the requesting provider on this date.  Electronically Signed: D. Rowe Robert 10/02/2015, 12:55 PM   I spent a total of 20 minutes in face to face in clinical consultation, greater than 50% of which was counseling/coordinating care for port a cath placement

## 2015-10-02 NOTE — Procedures (Signed)
Interventional Radiology Procedure Note  Procedure: Placement of a right IJ approach single lumen PowerPort.  Tip is positioned at the superior cavoatrial junction and catheter is ready for immediate use.  Complications: None Recommendations:  - Ok to shower tomorrow - Do not submerge for 7 days - Routine line care   Signed,  Jodye Scali S. Xayla Puzio, DO   

## 2015-10-05 ENCOUNTER — Other Ambulatory Visit: Payer: Self-pay | Admitting: Neurology

## 2015-10-08 ENCOUNTER — Ambulatory Visit: Payer: Medicaid Other | Admitting: Neurology

## 2015-10-12 ENCOUNTER — Encounter: Payer: Self-pay | Admitting: Hematology and Oncology

## 2015-10-12 ENCOUNTER — Ambulatory Visit: Payer: Medicaid Other

## 2015-10-12 ENCOUNTER — Telehealth: Payer: Self-pay | Admitting: *Deleted

## 2015-10-12 ENCOUNTER — Other Ambulatory Visit: Payer: Self-pay | Admitting: Hematology and Oncology

## 2015-10-12 ENCOUNTER — Telehealth: Payer: Self-pay | Admitting: Hematology and Oncology

## 2015-10-12 ENCOUNTER — Ambulatory Visit (HOSPITAL_BASED_OUTPATIENT_CLINIC_OR_DEPARTMENT_OTHER): Payer: Medicaid Other

## 2015-10-12 ENCOUNTER — Ambulatory Visit (HOSPITAL_BASED_OUTPATIENT_CLINIC_OR_DEPARTMENT_OTHER): Payer: Medicaid Other | Admitting: Hematology and Oncology

## 2015-10-12 ENCOUNTER — Other Ambulatory Visit (HOSPITAL_BASED_OUTPATIENT_CLINIC_OR_DEPARTMENT_OTHER): Payer: Medicaid Other

## 2015-10-12 VITALS — BP 105/83 | HR 74 | Temp 98.1°F | Resp 18 | Ht 69.0 in | Wt 169.3 lb

## 2015-10-12 DIAGNOSIS — R569 Unspecified convulsions: Secondary | ICD-10-CM | POA: Diagnosis not present

## 2015-10-12 DIAGNOSIS — R112 Nausea with vomiting, unspecified: Secondary | ICD-10-CM

## 2015-10-12 DIAGNOSIS — C711 Malignant neoplasm of frontal lobe: Secondary | ICD-10-CM

## 2015-10-12 DIAGNOSIS — Z95828 Presence of other vascular implants and grafts: Secondary | ICD-10-CM

## 2015-10-12 DIAGNOSIS — G620 Drug-induced polyneuropathy: Secondary | ICD-10-CM | POA: Diagnosis not present

## 2015-10-12 DIAGNOSIS — D702 Other drug-induced agranulocytosis: Secondary | ICD-10-CM | POA: Insufficient documentation

## 2015-10-12 DIAGNOSIS — Z5111 Encounter for antineoplastic chemotherapy: Secondary | ICD-10-CM

## 2015-10-12 DIAGNOSIS — T451X5A Adverse effect of antineoplastic and immunosuppressive drugs, initial encounter: Secondary | ICD-10-CM

## 2015-10-12 DIAGNOSIS — K5900 Constipation, unspecified: Secondary | ICD-10-CM | POA: Diagnosis not present

## 2015-10-12 LAB — COMPREHENSIVE METABOLIC PANEL
ALT: 12 U/L (ref 0–55)
AST: 15 U/L (ref 5–34)
Albumin: 3.8 g/dL (ref 3.5–5.0)
Alkaline Phosphatase: 38 U/L — ABNORMAL LOW (ref 40–150)
Anion Gap: 7 mEq/L (ref 3–11)
BUN: 6.5 mg/dL — ABNORMAL LOW (ref 7.0–26.0)
CO2: 26 mEq/L (ref 22–29)
Calcium: 8.8 mg/dL (ref 8.4–10.4)
Chloride: 107 mEq/L (ref 98–109)
Creatinine: 0.6 mg/dL (ref 0.6–1.1)
EGFR: >90 mL/min/{1.73_m2} (ref >90)
Glucose: 97 mg/dl (ref 70–140)
Potassium: 4.3 mEq/L (ref 3.5–5.1)
Sodium: 140 mEq/L (ref 136–145)
Total Bilirubin: <0.3 mg/dL (ref 0.20–1.20)
Total Protein: 6.6 g/dL (ref 6.4–8.3)

## 2015-10-12 LAB — CBC WITH DIFFERENTIAL/PLATELET
BASO%: 0.4 % (ref 0.0–2.0)
Basophils Absolute: 0 10*3/uL (ref 0.0–0.1)
EOS%: 0.9 % (ref 0.0–7.0)
Eosinophils Absolute: 0 10*3/uL (ref 0.0–0.5)
HCT: 33.7 % — ABNORMAL LOW (ref 34.8–46.6)
HGB: 11.6 g/dL (ref 11.6–15.9)
LYMPH%: 32.4 % (ref 14.0–49.7)
MCH: 32.4 pg (ref 25.1–34.0)
MCHC: 34.3 g/dL (ref 31.5–36.0)
MCV: 94.3 fL (ref 79.5–101.0)
MONO#: 0.1 10*3/uL (ref 0.1–0.9)
MONO%: 5.7 % (ref 0.0–14.0)
NEUT#: 1.5 10*3/uL (ref 1.5–6.5)
NEUT%: 60.6 % (ref 38.4–76.8)
Platelets: 173 10*3/uL (ref 145–400)
RBC: 3.57 10*6/uL — ABNORMAL LOW (ref 3.70–5.45)
RDW: 14.5 % (ref 11.2–14.5)
WBC: 2.5 10*3/uL — ABNORMAL LOW (ref 3.9–10.3)
lymph#: 0.8 10*3/uL — ABNORMAL LOW (ref 0.9–3.3)

## 2015-10-12 MED ORDER — SODIUM CHLORIDE 0.9% FLUSH
10.0000 mL | INTRAVENOUS | Status: DC | PRN
  Administered 2015-10-12: 10 mL
  Filled 2015-10-12: qty 10

## 2015-10-12 MED ORDER — LORAZEPAM 0.5 MG PO TABS: 0.5000 mg | ORAL_TABLET | Freq: Three times a day (TID) | ORAL | Status: DC | PRN

## 2015-10-12 MED ORDER — SODIUM CHLORIDE 0.9 % IV SOLN
Freq: Once | INTRAVENOUS | Status: AC
  Administered 2015-10-12: 11:00:00 via INTRAVENOUS

## 2015-10-12 MED ORDER — PALONOSETRON HCL INJECTION 0.25 MG/5ML
INTRAVENOUS | Status: AC
  Filled 2015-10-12: qty 5

## 2015-10-12 MED ORDER — MORPHINE SULFATE 15 MG PO TABS: 15.0000 mg | ORAL_TABLET | Freq: Four times a day (QID) | ORAL | Status: DC | PRN

## 2015-10-12 MED ORDER — VINCRISTINE SULFATE CHEMO INJECTION 1 MG/ML
1.0000 mg | Freq: Once | INTRAVENOUS | Status: AC
  Administered 2015-10-12: 1 mg via INTRAVENOUS
  Filled 2015-10-12: qty 1

## 2015-10-12 MED ORDER — HEPARIN SOD (PORK) LOCK FLUSH 100 UNIT/ML IV SOLN
500.0000 [IU] | Freq: Once | INTRAVENOUS | Status: AC | PRN
  Administered 2015-10-12: 500 [IU]
  Filled 2015-10-12: qty 5

## 2015-10-12 MED ORDER — BUTALBITAL-APAP-CAFFEINE 50-325-40 MG PO TABS: ORAL_TABLET | ORAL | Status: DC

## 2015-10-12 MED ORDER — PALONOSETRON HCL INJECTION 0.25 MG/5ML
0.2500 mg | Freq: Once | INTRAVENOUS | Status: AC
  Administered 2015-10-12: 0.25 mg via INTRAVENOUS

## 2015-10-12 MED ORDER — ONDANSETRON 8 MG PO TBDP: 8.0000 mg | ORAL_TABLET | Freq: Three times a day (TID) | ORAL | Status: DC | PRN

## 2015-10-12 MED ORDER — SODIUM CHLORIDE 0.9% FLUSH
10.0000 mL | INTRAVENOUS | Status: DC | PRN
  Administered 2015-10-12: 10 mL via INTRAVENOUS
  Filled 2015-10-12: qty 10

## 2015-10-12 MED FILL — LORazepam 0.5 MG TABS: 0.5 | 30 days supply | Qty: 90 | Fill #0

## 2015-10-12 MED FILL — BUTALB-ACETAMIN-CAFF 50-325: 50-325-40 | 22 days supply | Qty: 90 | Fill #0

## 2015-10-12 MED FILL — MORPHINE SULFATE IR 15 MG T: 15 | 22 days supply | Qty: 90 | Fill #0

## 2015-10-12 NOTE — Progress Notes (Signed)
Ok to treat today w/ ANC 1.5 per Dr. Alvy Bimler.

## 2015-10-12 NOTE — Patient Instructions (Signed)

## 2015-10-12 NOTE — Progress Notes (Signed)
[facility] OFFICE PROGRESS NOTE  Patient Care Team: Lavone Orn, MD as PCP - General (Internal Medicine) Ashok Pall, MD as Consulting Physician (Neurosurgery) Burnell Blanks. Salomon Fick, MD as Consulting Physician (Neurosurgery) Burnell Blanks. 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   09/27/2013 Pathology Results Accession: [id number] pathology showed grade 2 oligodendroglioma with mutant IDH1 protein, ATRX, OLIG 2 expression. p53 is negative, low Ki-67 labeling index. 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   11/06/2013 Imaging Repeat CT scan of the head showed resolution of abscess.   01/23/2014 Surgery Dr. Christella Noa perform a subtotal gross resection of the tumor.   01/24/2014 Imaging Repeat imaging studies showed persistent and residual disease.   03/03/2014 - 09/01/2014 Chemotherapy She was started on Temodar, 5 days every 28 days   03/13/2014 Imaging Repeat MRI showed no evidence of progression of disease.   07/14/2014 Imaging Repeat MRI showed no evidence of disease progression.   09/12/2014 Imaging MRI of the liver show mild regression of the size of lesions, presumed to be benign   09/15/2014 Imaging Repeat MRI of the brain show persistent abnormalities, suspicious for residual disease   03/02/2015 Imaging  MRI brain at [facility] show persistent abnormalities   08/10/2015 Imaging Repeat MRI [facility] showed stable disease   08/24/2015 -  Chemotherapy She was started on Lomustine, Procarbazine and Vincristine chemotherapy   08/30/2015 Imaging CT chest showed no evidence of aspiration pneumonia    INTERVAL HISTORY: Please see below for problem oriented charting. She is seen prior to her third and vincristine  dose. She denies recent severe nausea or vomiting but did complain of severe constipation. Her seizure control has improved with the use of lorazepam that was prescribed for nausea She still has minor pain from recent phlebitis. She had port placement recently without complication She denies new neurological deficits She is still taking regular medication for severe headaches  REVIEW OF SYSTEMS:   Constitutional: Denies fevers, chills or abnormal weight loss Eyes: Denies blurriness of vision Ears, nose, mouth, throat, and face: Denies mucositis or sore throat Respiratory: Denies cough, dyspnea or wheezes Cardiovascular: Denies palpitation, chest discomfort or lower extremity swelling Skin: Denies abnormal skin rashes Lymphatics: Denies new lymphadenopathy or easy bruising Neurological:Denies numbness, tingling or new weaknesses Behavioral/Psych: Mood is stable, no new changes  All other systems were reviewed with the patient and are negative.  I have reviewed the past medical history, past surgical history, social history and family history with the patient and they are unchanged from previous note.  ALLERGIES:  is allergic to adhesive; latex; percocet; shellfish allergy; skin adhesives; soma; tramadol hcl; and vancomycin.  MEDICATIONS:  Current Outpatient Prescriptions  Medication Sig Dispense Refill  . albuterol (PROVENTIL HFA;VENTOLIN HFA) 108 (90 BASE) MCG/ACT inhaler Inhale 2 puffs into the lungs every 6 (six) hours as needed for wheezing or shortness of breath. Reported on 08/11/2015    . butalbital-acetaminophen-caffeine (FIORICET, ESGIC) 50-325-40 MG tablet TAKE 1 TABLET BY MOUTH EVERY 6 HOURS AS NEEDED FOR HEADACE. 90 tablet 0  . carisoprodol (SOMA) 350 MG tablet Take 1 tablet (350 mg total) by mouth 4 (four) times daily. For seizure prevention 90 tablet 5  . cetirizine (ZYRTEC) 10 MG tablet Take 10 mg by mouth daily.    Marland Kitchen  dicyclomine (BENTYL) 20 MG tablet Take 20 mg by mouth  at bedtime as needed and may repeat dose one time if needed for spasms.    . divalproex (DEPAKOTE ER) 250 MG 24 hr tablet TAKE 2 TABLETS BY MOUTH IN THE MORNING AND 3 TABLETS EVERY EVENING 450 tablet 1  . gabapentin (NEURONTIN) 300 MG capsule TAKE 3 CAPSULES BY MOUTH IN THE MORNING AND 3 CAPSULES IN THE EVENING 540 capsule 1  . HYDROcodone-acetaminophen (NORCO) 10-325 MG tablet Take 1 tablet by mouth every 6 (six) hours as needed. 90 tablet 0  . lidocaine-prilocaine (EMLA) cream Apply 1 application topically as needed. 30 g 6  . lomustine (CEENU) 100 MG capsule Take 2 capsules (200 mg total) by mouth once. Take on an emtpy stomach 1 hour before or 2 hours after meals. Caution:chemotherapy 2 capsule 0  . lomustine (CEENU) 40 MG capsule Take 1 capsule (40 mg total) by mouth once. Take on an empty stomach 1 hour before or 2 hours after meals. Caution:chemotherapy. 1 capsule 0  . LORazepam (ATIVAN) 0.5 MG tablet Take 1 tablet (0.5 mg total) by mouth every 8 (eight) hours as needed for anxiety or seizure. 90 tablet 0  . morphine (MSIR) 15 MG tablet Take 1 tablet (15 mg total) by mouth every 6 (six) hours as needed for severe pain. 90 tablet 0  . ondansetron (ZOFRAN ODT) 8 MG disintegrating tablet Take 1 tablet (8 mg total) by mouth every 8 (eight) hours as needed for nausea or vomiting. 90 tablet 3  . ondansetron (ZOFRAN) 4 MG tablet Take 4 mg by mouth every 8 (eight) hours as needed for nausea or vomiting. Reported on 09/21/2015    . procarbazine (MATULANE) 50 MG capsule Take 2 capsules (100 mg total) by mouth daily. Follow low tyramine diet. Take for 14 days starting on Day 8. Repeat every 6 weeks 28 capsule 0  . promethazine (PHENERGAN) 25 MG tablet Take 25 mg by mouth every 6 (six) hours as needed for nausea or vomiting. Reported on 09/21/2015    . scopolamine (TRANSDERM-SCOP) 1 MG/3DAYS Place 1 patch (1.5 mg total) onto the skin every 3 (three) days. 10 patch 12  . zolmitriptan (ZOMIG) 5 MG tablet Take 5  mg by mouth as needed for migraine.     No current facility-administered medications for this visit.   Facility-Administered Medications Ordered in Other Visits  Medication Dose Route Frequency Provider Last Rate Last Dose  . 0.9 %  sodium chloride infusion   Intravenous Once Artis Delay, MD      . heparin lock flush 100 unit/mL  500 Units Intracatheter Once PRN Artis Delay, MD      . palonosetron (ALOXI) injection 0.25 mg  0.25 mg Intravenous Once Artis Delay, MD      . sodium chloride flush (NS) 0.9 % injection 10 mL  10 mL Intracatheter PRN Artis Delay, MD      . vinCRIStine (ONCOVIN) 1 mg in sodium chloride 0.9 % 50 mL chemo infusion  1 mg Intravenous Once Artis Delay, MD        PHYSICAL EXAMINATION: ECOG PERFORMANCE STATUS: 1 - Symptomatic but completely ambulatory  Filed Vitals:   10/12/15 1003  BP: 105/83  Pulse: 74  Temp: 98.1 F (36.7 C)  Resp: 18   Filed Weights   10/12/15 1003  Weight: 169 lb 4.8 oz (76.794 kg)    GENERAL:alert, no distress and comfortable SKIN: skin color, texture, turgor are normal, no rashes or significant lesions  EYES: normal, Conjunctiva are pink and non-injected, sclera clear OROPHARYNX:no exudate, no erythema and lips, buccal mucosa, and tongue normal  NECK: supple, thyroid normal size, non-tender, without nodularity LYMPH:  no palpable lymphadenopathy in the cervical, axillary or inguinal LUNGS: clear to auscultation and percussion with normal breathing effort HEART: regular rate & rhythm and no murmurs and no lower extremity edema ABDOMEN:abdomen soft, non-tender and normal bowel sounds Musculoskeletal:no cyanosis of digits and no clubbing  NEURO: alert & oriented x 3 with fluent speech, no focal motor/sensory deficits  LABORATORY DATA:  I have reviewed the data as listed    Component Value Date/Time   NA 140 10/12/2015 0922   NA 141 10/02/2015 1215   K 4.3 10/12/2015 0922   K 3.9 10/02/2015 1215   CL 105 10/02/2015 1215   CO2 26  10/12/2015 0922   CO2 27 10/02/2015 1215   GLUCOSE 97 10/12/2015 0922   GLUCOSE 84 10/02/2015 1215   BUN 6.5* 10/12/2015 0922   BUN 9 10/02/2015 1215   CREATININE 0.6 10/12/2015 0922   CREATININE 0.60 10/02/2015 1215   CALCIUM 8.8 10/12/2015 0922   CALCIUM 9.3 10/02/2015 1215   PROT 6.6 10/12/2015 0922   PROT 6.7 08/30/2015 0105   ALBUMIN 3.8 10/12/2015 0922   ALBUMIN 4.3 08/30/2015 0105   AST 15 10/12/2015 0922   AST 22 08/30/2015 0105   ALT 12 10/12/2015 0922   ALT 30 08/30/2015 0105   ALKPHOS 38* 10/12/2015 0922   ALKPHOS 38 08/30/2015 0105   BILITOT <0.30 10/12/2015 0922   BILITOT 0.1* 08/30/2015 0105   GFRNONAA >60 10/02/2015 1215   GFRAA >60 10/02/2015 1215    No results found for: SPEP, UPEP  Lab Results  Component Value Date   WBC 2.5* 10/12/2015   NEUTROABS 1.5 10/12/2015   HGB 11.6 10/12/2015   HCT 33.7* 10/12/2015   MCV 94.3 10/12/2015   PLT 173 10/12/2015      Chemistry      Component Value Date/Time   NA 140 10/12/2015 0922   NA 141 10/02/2015 1215   K 4.3 10/12/2015 0922   K 3.9 10/02/2015 1215   CL 105 10/02/2015 1215   CO2 26 10/12/2015 0922   CO2 27 10/02/2015 1215   BUN 6.5* 10/12/2015 0922   BUN 9 10/02/2015 1215   CREATININE 0.6 10/12/2015 0922   CREATININE 0.60 10/02/2015 1215      Component Value Date/Time   CALCIUM 8.8 10/12/2015 0922   CALCIUM 9.3 10/02/2015 1215   ALKPHOS 38* 10/12/2015 0922   ALKPHOS 38 08/30/2015 0105   AST 15 10/12/2015 0922   AST 22 08/30/2015 0105   ALT 12 10/12/2015 0922   ALT 30 08/30/2015 0105   BILITOT <0.30 10/12/2015 0922   BILITOT 0.1* 08/30/2015 0105     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 I will continue supportive care and medication adjustment. I plan to repeat MRI around 4/27 and see her back on 5/1 before start of cycle 3 of  chemo   Drug-induced neutropenia (HCC) I plan to reduce the dose of vincristine due to mild neutropenia and neuropathy. 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 I  plan to reduce the dose of vincristine  Constipation, acute She has recent severe constipation likely exacerbated by her medication and treatment. I recommend Senokot regularly and plan to reduce the dose of vincristine the little bit.  Seizures She has complex seizure disorders. Recently, with the addition of lorazepam to control nausea, her seizure frequency has reduced and she is more functional. I refill her prescription today recommend she continues close follow-up with her neurologist  Nausea & vomiting She has recent uncontrolled nausea and vomiting. We will continue premedication with Aloxi and ODT Zofran prn She has not started using scopolamine patch yet     All questions were answered. The patient knows to call the clinic with any problems, questions or concerns. No barriers to learning was detected. I spent 30 minutes counseling the patient face to face. The total time spent in the appointment was 40 minutes and more than 50% was on counseling and review of test results     [facility], [place], MD 10/12/2015 11:07 AM

## 2015-10-12 NOTE — Telephone Encounter (Signed)
Per staff message and POF I have scheduled appts. Advised scheduler of appts. JMW  

## 2015-10-12 NOTE — Progress Notes (Signed)
1158: Good blood return noted at beginning of infusion, prior to Vincristine administration, and at end of infusion.    1235: At end of infusion, pt questioning whether to continue taking oral Procarbazine as currently prescribed or to change regimen based on apparent conversation during MD visit.  Victoria Reeves, MD notified and she verbalized that pt to continue taking Procarbazine as currently prescribed unless she hears anything otherwise from [place] after consulting with Montel Clock.  Information included in AVS regarding this as well as discussed with pt. Pt verbalized understanding and no further questions at time of discharge.

## 2015-10-12 NOTE — Assessment & Plan Note (Signed)
She has complex seizure disorders. Recently, with the addition of lorazepam to control nausea, her seizure frequency has reduced and she is more functional. I refill her prescription today recommend she continues close follow-up with her neurologist

## 2015-10-12 NOTE — Assessment & Plan Note (Signed)
She tolerated treatment poorly due to uncontrollable nausea and vomiting. I will continue supportive care and medication adjustment. I plan to repeat MRI around 4/27 and see her back on 5/1 before start of cycle 3 of chemo

## 2015-10-12 NOTE — Telephone Encounter (Signed)
per pof to sch pt appt-sent MW meial ot sch trmt per pof-pt to get updated copy b4 leaving trmt

## 2015-10-12 NOTE — Patient Instructions (Addendum)
[facility] Discharge Instructions for Patients Receiving Chemotherapy  Today you received the following chemotherapy agents Vincristine.  To help prevent nausea and vomiting after your treatment, we encourage you to take your nausea medication as directed.   If you develop nausea and vomiting that is not controlled by your nausea medication, call the clinic.   BELOW ARE SYMPTOMS THAT SHOULD BE REPORTED IMMEDIATELY:  *FEVER GREATER THAN 100.5 F  *CHILLS WITH OR WITHOUT FEVER  NAUSEA AND VOMITING THAT IS NOT CONTROLLED WITH YOUR NAUSEA MEDICATION  *UNUSUAL SHORTNESS OF BREATH  *UNUSUAL BRUISING OR BLEEDING  TENDERNESS IN MOUTH AND THROAT WITH OR WITHOUT PRESENCE OF ULCERS  *URINARY PROBLEMS  *BOWEL PROBLEMS  UNUSUAL RASH Items with * indicate a potential emergency and should be followed up as soon as possible.  Feel free to call the clinic you have any questions or concerns. The clinic phone number is [phone].  Please show the [place] at check-in to the Emergency Department and triage nurse.  Continue taking procarbazine as prescribed unless you hear otherwise from [place].

## 2015-10-12 NOTE — Assessment & Plan Note (Signed)
She has recent severe constipation likely exacerbated by her medication and treatment. I recommend Senokot regularly and plan to reduce the dose of vincristine the little bit.

## 2015-10-12 NOTE — Telephone Encounter (Signed)
Pt's insurance is requiring Prior Auth on [place].   Instructed [facility] to ask [facility] P[facility]A to Dr. Hewitt Shorts office since he is the original prescriber for this medication.   Dr. Alvy Bimler agreed to refill medication for pt's convenience,  But the prior auth will need to be completed by Dr. Cyndy Freeze since he has prescribed it to treat seizures.   I left VM for Dr. Hewitt Shorts CMA, Pricilla Handler, informing her of PA needed and to please contact pt or our office if any questions or problems.

## 2015-10-12 NOTE — Assessment & Plan Note (Signed)
She has recent uncontrolled nausea and vomiting. We will continue premedication with Aloxi and ODT Zofran prn She has not started using scopolamine patch yet

## 2015-10-12 NOTE — Assessment & Plan Note (Signed)
I plan to reduce the dose of vincristine due to mild neutropenia and neuropathy. I will consult with the pharmacist regarding possibility of reducing dose procarbazine in the future or CCNU if needed

## 2015-10-12 NOTE — Assessment & Plan Note (Signed)
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 I plan to reduce the dose of vincristine

## 2015-10-13 ENCOUNTER — Telehealth: Payer: Self-pay | Admitting: *Deleted

## 2015-10-13 ENCOUNTER — Other Ambulatory Visit: Payer: Self-pay | Admitting: Hematology and Oncology

## 2015-10-13 NOTE — Telephone Encounter (Signed)
Pt states she is taking Procarbazine 50 mg tabs- 2 tablets daily. Instructed to take 100 mg every other day (M/W/F/Sun) and 50 mg on T/Th/Sat. Will have repeat labs on Monday at 10:00am.

## 2015-10-15 ENCOUNTER — Other Ambulatory Visit: Payer: Self-pay | Admitting: *Deleted

## 2015-10-15 ENCOUNTER — Encounter: Payer: Self-pay | Admitting: *Deleted

## 2015-10-15 ENCOUNTER — Telehealth: Payer: Self-pay | Admitting: Hematology and Oncology

## 2015-10-15 NOTE — Telephone Encounter (Signed)
per pof to sch pt flush added

## 2015-10-16 ENCOUNTER — Telehealth: Payer: Self-pay

## 2015-10-16 ENCOUNTER — Other Ambulatory Visit: Payer: Self-pay

## 2015-10-16 NOTE — Telephone Encounter (Signed)
Patient called and wanted to have lab on Monday 10/19/15 changed to today.  It appears that patient was dose reduced on her oral chemo due to low WBC's and labs were set up on Monday.  Patient wants to spend the day at her child's school however is a bit nervous that if she continues at that time to be neutropenic it would not be a good idea.  Patient's lab appt was moved up to an earlier time on Monday, she will wait for the test results- hoping to speak to Dr. Calton Dach nurse.  She will decide after her lab results if she will attend her child's school function.

## 2015-10-19 ENCOUNTER — Telehealth: Payer: Self-pay | Admitting: *Deleted

## 2015-10-19 ENCOUNTER — Ambulatory Visit (HOSPITAL_BASED_OUTPATIENT_CLINIC_OR_DEPARTMENT_OTHER): Payer: Medicaid Other

## 2015-10-19 ENCOUNTER — Other Ambulatory Visit (HOSPITAL_BASED_OUTPATIENT_CLINIC_OR_DEPARTMENT_OTHER): Payer: Medicaid Other

## 2015-10-19 VITALS — BP 128/78 | HR 106 | Temp 98.1°F | Resp 18

## 2015-10-19 DIAGNOSIS — C711 Malignant neoplasm of frontal lobe: Secondary | ICD-10-CM | POA: Diagnosis present

## 2015-10-19 DIAGNOSIS — Z95828 Presence of other vascular implants and grafts: Secondary | ICD-10-CM

## 2015-10-19 LAB — CBC WITH DIFFERENTIAL/PLATELET
BASO%: 0.2 % (ref 0.0–2.0)
Basophils Absolute: 0 10*3/uL (ref 0.0–0.1)
EOS%: 0.2 % (ref 0.0–7.0)
Eosinophils Absolute: 0 10*3/uL (ref 0.0–0.5)
HCT: 32.6 % — ABNORMAL LOW (ref 34.8–46.6)
HGB: 11.1 g/dL — ABNORMAL LOW (ref 11.6–15.9)
LYMPH%: 38.6 % (ref 14.0–49.7)
MCH: 32.6 pg (ref 25.1–34.0)
MCHC: 34.2 g/dL (ref 31.5–36.0)
MCV: 95.4 fL (ref 79.5–101.0)
MONO#: 0.2 10*3/uL (ref 0.1–0.9)
MONO%: 10 % (ref 0.0–14.0)
NEUT#: 0.8 10*3/uL — ABNORMAL LOW (ref 1.5–6.5)
NEUT%: 51 % (ref 38.4–76.8)
Platelets: 179 10*3/uL (ref 145–400)
RBC: 3.42 10*6/uL — ABNORMAL LOW (ref 3.70–5.45)
RDW: 15 % — ABNORMAL HIGH (ref 11.2–14.5)
WBC: 1.6 10*3/uL — ABNORMAL LOW (ref 3.9–10.3)
lymph#: 0.6 10*3/uL — ABNORMAL LOW (ref 0.9–3.3)

## 2015-10-19 LAB — COMPREHENSIVE METABOLIC PANEL
ALT: 14 U/L (ref 0–55)
AST: 15 U/L (ref 5–34)
Albumin: 3.8 g/dL (ref 3.5–5.0)
Alkaline Phosphatase: 36 U/L — ABNORMAL LOW (ref 40–150)
Anion Gap: 7 mEq/L (ref 3–11)
BUN: 8.1 mg/dL (ref 7.0–26.0)
CO2: 25 mEq/L (ref 22–29)
Calcium: 8.9 mg/dL (ref 8.4–10.4)
Chloride: 109 mEq/L (ref 98–109)
Creatinine: 0.7 mg/dL (ref 0.6–1.1)
EGFR: >90 mL/min/{1.73_m2} (ref >90)
Glucose: 100 mg/dl (ref 70–140)
Potassium: 4.2 mEq/L (ref 3.5–5.1)
Sodium: 141 mEq/L (ref 136–145)
Total Bilirubin: <0.3 mg/dL (ref 0.20–1.20)
Total Protein: 6.6 g/dL (ref 6.4–8.3)

## 2015-10-19 MED ORDER — SODIUM CHLORIDE 0.9% FLUSH
10.0000 mL | INTRAVENOUS | Status: DC | PRN
  Administered 2015-10-19: 10 mL via INTRAVENOUS
  Filled 2015-10-19: qty 10

## 2015-10-19 MED ORDER — HEPARIN SOD (PORK) LOCK FLUSH 100 UNIT/ML IV SOLN
500.0000 [IU] | Freq: Once | INTRAVENOUS | Status: AC
  Administered 2015-10-19: 500 [IU] via INTRAVENOUS
  Filled 2015-10-19: qty 5

## 2015-10-19 NOTE — Telephone Encounter (Signed)
S/w pt at length regarding her low ANC today.  Instructed to hold Procarbazine the rest of this week.  Do not resume until further instructions (likely will resume on next cycle).   Instructed on Neutropenic precautions.  Next appts in 2 weeks.  Pt states will feel better if can recheck her labs in one week.  Lab/Flush appt made for next Monday.   Pt denies any fevers but reports some significant fatigue.  Discussed this is common side effect of her chemotherapy.  Discussed strategies for fatigue,  Frequent rests, etc..  Instructed to call us if any fevers or signs of infection.  She verbalized understanding.

## 2015-10-19 NOTE — Patient Instructions (Signed)

## 2015-10-19 NOTE — Telephone Encounter (Signed)
-----   Message from Heath Lark, MD sent at 10/19/2015  9:11 AM EDT ----- Regarding: Hold procarbazine Low ANC Hold the rest of her chemo this week ----- Message -----    From: Lab in [facility] Interface    Sent: 10/19/2015   9:07 AM      To: Heath Lark, MD

## 2015-10-22 ENCOUNTER — Telehealth: Payer: Self-pay | Admitting: *Deleted

## 2015-10-22 MED ORDER — HYDROCODONE-ACETAMINOPHEN 10-325 MG PO TABS: 1.0000 | ORAL_TABLET | Freq: Four times a day (QID) | ORAL | Status: DC | PRN

## 2015-10-22 NOTE — Telephone Encounter (Signed)
Informed husband of Rx Hydrocodone ready to pick up.

## 2015-10-22 NOTE — Telephone Encounter (Signed)
Pt left VM for nurse states she wants to cancel her Lab for Monday 4/10.  She decided she doesn't need to have CBC rechecked knowing it won't change her treatment.  She will return on 4/17 as previously scheduled for lab/chemo.   Pt states she needs refill on Hydrocodone and to please call her husband when Rx ready to pick up.

## 2015-10-26 ENCOUNTER — Other Ambulatory Visit: Payer: Medicaid Other

## 2015-10-29 ENCOUNTER — Telehealth: Payer: Self-pay | Admitting: *Deleted

## 2015-10-29 NOTE — Telephone Encounter (Signed)
Pt requests refill on Fioricet and Lorazepam to pick up tomorrow.   Asks if Dr. Alvy Bimler will increase Lorazepam to 1 mg?  Pt says she thinks the lorazepam working better than any other seizure medication but she thinks it needs to be a little stronger.    Pt asks nurse to call her when Rxs are ready to pick up.

## 2015-10-30 ENCOUNTER — Other Ambulatory Visit: Payer: Self-pay | Admitting: Hematology and Oncology

## 2015-10-30 ENCOUNTER — Telehealth: Payer: Self-pay | Admitting: *Deleted

## 2015-10-30 MED ORDER — LORAZEPAM 0.5 MG PO TABS: 0.5000 mg | ORAL_TABLET | Freq: Three times a day (TID) | ORAL | Status: DC | PRN

## 2015-10-30 MED ORDER — BUTALBITAL-APAP-CAFFEINE 50-325-40 MG PO TABS: ORAL_TABLET | ORAL | Status: DC

## 2015-10-30 MED FILL — BUTALB-ACETAMIN-CAFF 50-325: 50-325-40 | 22 days supply | Qty: 90 | Fill #0

## 2015-10-30 NOTE — Telephone Encounter (Signed)
She is on multiple medications for seizures. Soma and morphine both can cause respiratory depression I am not comfortable increasing the dosage. She needs to consult with her neurologist for medication adjustment.

## 2015-10-30 NOTE — Telephone Encounter (Signed)
Notified prescriptions are ready for pick up. Dr Alvy Bimler does not feel comfortable increasing ativan, need to discuss with neurologist

## 2015-11-02 ENCOUNTER — Ambulatory Visit (HOSPITAL_BASED_OUTPATIENT_CLINIC_OR_DEPARTMENT_OTHER): Payer: Medicaid Other

## 2015-11-02 ENCOUNTER — Other Ambulatory Visit: Payer: Self-pay | Admitting: Hematology and Oncology

## 2015-11-02 ENCOUNTER — Ambulatory Visit: Payer: Medicaid Other

## 2015-11-02 ENCOUNTER — Other Ambulatory Visit: Payer: Medicaid Other

## 2015-11-02 ENCOUNTER — Other Ambulatory Visit (HOSPITAL_BASED_OUTPATIENT_CLINIC_OR_DEPARTMENT_OTHER): Payer: Medicaid Other

## 2015-11-02 VITALS — BP 183/104 | HR 82 | Temp 98.1°F

## 2015-11-02 DIAGNOSIS — C711 Malignant neoplasm of frontal lobe: Secondary | ICD-10-CM | POA: Diagnosis not present

## 2015-11-02 DIAGNOSIS — Z95828 Presence of other vascular implants and grafts: Secondary | ICD-10-CM

## 2015-11-02 DIAGNOSIS — C717 Malignant neoplasm of brain stem: Secondary | ICD-10-CM

## 2015-11-02 DIAGNOSIS — Z5111 Encounter for antineoplastic chemotherapy: Secondary | ICD-10-CM | POA: Diagnosis present

## 2015-11-02 LAB — CBC WITH DIFFERENTIAL/PLATELET
BASO%: 0.4 % (ref 0.0–2.0)
Basophils Absolute: 0 10*3/uL (ref 0.0–0.1)
EOS%: 0 % (ref 0.0–7.0)
Eosinophils Absolute: 0 10*3/uL (ref 0.0–0.5)
HCT: 33.1 % — ABNORMAL LOW (ref 34.8–46.6)
HGB: 11.6 g/dL (ref 11.6–15.9)
LYMPH%: 18.6 % (ref 14.0–49.7)
MCH: 34.2 pg — ABNORMAL HIGH (ref 25.1–34.0)
MCHC: 35 g/dL (ref 31.5–36.0)
MCV: 97.6 fL (ref 79.5–101.0)
MONO#: 0.5 10*3/uL (ref 0.1–0.9)
MONO%: 8.8 % (ref 0.0–14.0)
NEUT#: 4.1 10*3/uL (ref 1.5–6.5)
NEUT%: 72.2 % (ref 38.4–76.8)
Platelets: 152 10*3/uL (ref 145–400)
RBC: 3.39 10*6/uL — ABNORMAL LOW (ref 3.70–5.45)
RDW: 15.2 % — ABNORMAL HIGH (ref 11.2–14.5)
WBC: 5.7 10*3/uL (ref 3.9–10.3)
lymph#: 1.1 10*3/uL (ref 0.9–3.3)

## 2015-11-02 LAB — COMPREHENSIVE METABOLIC PANEL
ALT: 11 U/L (ref 0–55)
AST: 16 U/L (ref 5–34)
Albumin: 4.1 g/dL (ref 3.5–5.0)
Alkaline Phosphatase: 37 U/L — ABNORMAL LOW (ref 40–150)
Anion Gap: 8 mEq/L (ref 3–11)
BUN: 9.2 mg/dL (ref 7.0–26.0)
CO2: 28 mEq/L (ref 22–29)
Calcium: 9.3 mg/dL (ref 8.4–10.4)
Chloride: 105 mEq/L (ref 98–109)
Creatinine: 0.7 mg/dL (ref 0.6–1.1)
EGFR: >90 mL/min/{1.73_m2} (ref >90)
Glucose: 86 mg/dl (ref 70–140)
Potassium: 4.4 mEq/L (ref 3.5–5.1)
Sodium: 141 mEq/L (ref 136–145)
Total Bilirubin: 0.32 mg/dL (ref 0.20–1.20)
Total Protein: 6.9 g/dL (ref 6.4–8.3)

## 2015-11-02 MED ORDER — SODIUM CHLORIDE 0.9 % IV SOLN
Freq: Once | INTRAVENOUS | Status: AC
  Administered 2015-11-02: 11:00:00 via INTRAVENOUS

## 2015-11-02 MED ORDER — HEPARIN SOD (PORK) LOCK FLUSH 100 UNIT/ML IV SOLN
500.0000 [IU] | Freq: Once | INTRAVENOUS | Status: AC | PRN
  Administered 2015-11-02: 500 [IU]
  Filled 2015-11-02: qty 5

## 2015-11-02 MED ORDER — SODIUM CHLORIDE 0.9% FLUSH
10.0000 mL | INTRAVENOUS | Status: DC | PRN
  Administered 2015-11-02: 10 mL
  Filled 2015-11-02: qty 10

## 2015-11-02 MED ORDER — PALONOSETRON HCL INJECTION 0.25 MG/5ML
0.2500 mg | Freq: Once | INTRAVENOUS | Status: AC
  Administered 2015-11-02: 0.25 mg via INTRAVENOUS

## 2015-11-02 MED ORDER — PALONOSETRON HCL INJECTION 0.25 MG/5ML
INTRAVENOUS | Status: AC
  Filled 2015-11-02: qty 5

## 2015-11-02 MED ORDER — VINCRISTINE SULFATE CHEMO INJECTION 1 MG/ML
1.0000 mg | Freq: Once | INTRAVENOUS | Status: AC
  Administered 2015-11-02: 1 mg via INTRAVENOUS
  Filled 2015-11-02: qty 1

## 2015-11-02 MED ORDER — SODIUM CHLORIDE 0.9% FLUSH
10.0000 mL | INTRAVENOUS | Status: DC | PRN
  Administered 2015-11-02: 10 mL via INTRAVENOUS
  Filled 2015-11-02: qty 10

## 2015-11-02 NOTE — Patient Instructions (Signed)

## 2015-11-02 NOTE — Progress Notes (Signed)
Ok to treat with today's BP per MD Alvy Bimler. Pt reports her Lorazepam and seizure meds control this. Her only symptom is dizziness and she reports she "has this often and is not new today".

## 2015-11-02 NOTE — Patient Instructions (Addendum)
[facility] Discharge Instructions for Patients Receiving Chemotherapy  Today you received the following chemotherapy agents Vincristine. To help prevent nausea and vomiting after your treatment, we encourage you to take your nausea medication as directed.NO ZOFRAN FOR 3 DAYS. If you develop nausea and vomiting that is not controlled by your nausea medication, call the clinic.   BELOW ARE SYMPTOMS THAT SHOULD BE REPORTED IMMEDIATELY:  *FEVER GREATER THAN 100.5 F  *CHILLS WITH OR WITHOUT FEVER  NAUSEA AND VOMITING THAT IS NOT CONTROLLED WITH YOUR NAUSEA MEDICATION  *UNUSUAL SHORTNESS OF BREATH  *UNUSUAL BRUISING OR BLEEDING  TENDERNESS IN MOUTH AND THROAT WITH OR WITHOUT PRESENCE OF ULCERS  *URINARY PROBLEMS  *BOWEL PROBLEMS  UNUSUAL RASH Items with * indicate a potential emergency and should be followed up as soon as possible.  Feel free to call the clinic you have any questions or concerns. The clinic phone number is [phone].  Please show the [place] at check-in to the Emergency Department and triage nurse.

## 2015-11-05 ENCOUNTER — Telehealth: Payer: Self-pay | Admitting: *Deleted

## 2015-11-05 ENCOUNTER — Other Ambulatory Visit: Payer: Self-pay | Admitting: Hematology and Oncology

## 2015-11-05 DIAGNOSIS — C719 Malignant neoplasm of brain, unspecified: Secondary | ICD-10-CM

## 2015-11-05 NOTE — Telephone Encounter (Signed)
Pt requests [facility]   She will run out tomorrow afternoon.   She says Dr. Cyndy Freeze did the PA and ordered last Rx but did not give any refills.

## 2015-11-05 NOTE — Telephone Encounter (Signed)
The last refill I sent had 5 refills on it. Unless it was not sent to the specific pharmacy. Go ahead and refill electronically

## 2015-11-09 ENCOUNTER — Telehealth: Payer: Self-pay | Admitting: *Deleted

## 2015-11-09 ENCOUNTER — Encounter: Payer: Self-pay | Admitting: Pharmacist

## 2015-11-09 DIAGNOSIS — C711 Malignant neoplasm of frontal lobe: Secondary | ICD-10-CM

## 2015-11-09 MED ORDER — LOMUSTINE 40 MG PO CAPS: 40.0000 mg | ORAL_CAPSULE | Freq: Once | ORAL | Status: DC

## 2015-11-09 MED ORDER — MORPHINE SULFATE 15 MG PO TABS: 15.0000 mg | ORAL_TABLET | Freq: Four times a day (QID) | ORAL | Status: DC | PRN

## 2015-11-09 MED ORDER — LOMUSTINE 100 MG PO CAPS
200.0000 mg | ORAL_CAPSULE | Freq: Once | ORAL | Status: DC
Start: 2015-11-16 — End: 2015-12-29

## 2015-11-09 MED FILL — GLEOSTINE 40 MG CAPSULE: 40 | 1 days supply | Qty: 1 | Fill #0

## 2015-11-09 MED FILL — GLEOSTINE 100 MG CAPSULE: 100 | 1 days supply | Qty: 2 | Fill #0

## 2015-11-09 NOTE — Telephone Encounter (Signed)
No keep MRI as is for now unless we cannot get that approved

## 2015-11-09 NOTE — Telephone Encounter (Signed)
Pt left VM states needs refill on MS IR 15 mg and to call [name] when ready to pick up.  She also asks if she can discuss MRI w/ Dr. Alvy Bimler?  She wants to know if it would be better to wait for one more round of chemo before doing MRI?

## 2015-11-09 NOTE — Progress Notes (Signed)
Oral Chemotherapy Follow-Up Form  Original Start date of oral chemotherapy: _2/6/17 - Day 1 Cycle 1__   Called patient today to follow up regarding patient's oral chemotherapy medication: _Lomustine (CCNU) and procarbazine__  Pt is doing well today. She has completed 2 cycles of 6 weeks CVP. Fatigue has improved but does have this while on treatment. She is due to start cycle 3 on 5/1  Her procarbazine has been on hold for cycle 2 due to low counts. Pt does report fatigue with this regimen. She will have MRI on Friday and return on Monday 5/1 to see Dr. Alvy Bimler to discuss plans.   Pt reports _0___ tablets/doses missed in the last week/month.    Pt reports the following side effects: __myelosuppression and fatigue__   Will follow up and call patient again in _1 week__   Thank you,  Montel Clock, PharmD, [facility]

## 2015-11-09 NOTE — Telephone Encounter (Signed)
Informed pt Rx for MS IR ready to pick up.   Also informed her Dr. Alvy Bimler thinks she should proceed w/ MRI as scheduled for this Friday.  Informed pt Dr. Alvy Bimler says it will help guide treatment and pt agrees.

## 2015-11-10 MED FILL — MORPHINE SULFATE IR 15 MG T: 15 | 22 days supply | Qty: 90 | Fill #0

## 2015-11-10 MED FILL — LORazepam 0.5 MG TABS: 0.5 | 30 days supply | Qty: 90 | Fill #0

## 2015-11-13 ENCOUNTER — Ambulatory Visit (HOSPITAL_COMMUNITY): Payer: Medicaid Other

## 2015-11-13 ENCOUNTER — Encounter: Payer: Self-pay | Admitting: Hematology and Oncology

## 2015-11-13 ENCOUNTER — Telehealth: Payer: Self-pay | Admitting: *Deleted

## 2015-11-13 NOTE — Telephone Encounter (Signed)
Victoria Bates inquiring status of today's MRI.    Called [facility] and Central Scheduling to learn MRI is still under review and needs to be rescheduled.  Menna will call to reschedule.  Scheduled to see Dr. Alvy Bimler 11-16-2015 at 10:30 am to go over these results and would like to know if this appointment should be rescheduled.  Advised we'll call her today or Monday Morning before 10:30 with provider orders.

## 2015-11-16 ENCOUNTER — Telehealth: Payer: Self-pay | Admitting: *Deleted

## 2015-11-16 ENCOUNTER — Other Ambulatory Visit: Payer: Self-pay | Admitting: *Deleted

## 2015-11-16 ENCOUNTER — Ambulatory Visit: Payer: Medicaid Other | Admitting: Hematology and Oncology

## 2015-11-16 MED ORDER — PROCARBAZINE HCL 50 MG PO CAPS: 100.0000 mg | ORAL_CAPSULE | Freq: Every day | ORAL | Status: DC

## 2015-11-16 NOTE — Telephone Encounter (Signed)
Pt states she and her son have both had a cough X 3 weeks. Today MD told Victoria Bates that son has "mycoplasma", is contagious and does not cause fever. Son has been placed on zithromycin.   Aryiel wants to know if she should be on anything or should she follow up with PCP

## 2015-11-16 NOTE — Telephone Encounter (Signed)
Pt is coming for MRI on Wednesday at 1645, will have labs/port access @ 1530. Will see Dr Alvy Bimler on Thursday @ 1130

## 2015-11-16 NOTE — Telephone Encounter (Signed)
Victoria Bates  11/16/2015  Telephone  MRN:  [id number]   Description: 35 year old female  Provider: Patton Salles, RN  Department: [facility]       Reason for Call     Appointment         Call Documentation      Patton Salles, RN at 11/16/2015 10:00 AM     Status: Signed       Expand All Collapse All   Pt is coming for MRI on Wednesday at 1645, will have labs/port access @ 1530. Will see Dr Alvy Bimler on Thursday @ 1130

## 2015-11-17 ENCOUNTER — Telehealth: Payer: Self-pay | Admitting: *Deleted

## 2015-11-17 NOTE — Telephone Encounter (Signed)
Follow-up PCP

## 2015-11-17 NOTE — Telephone Encounter (Signed)
Left message for patient to follow up with PCP regarding cough.

## 2015-11-18 ENCOUNTER — Ambulatory Visit (HOSPITAL_COMMUNITY)
Admission: RE | Admit: 2015-11-18 | Discharge: 2015-11-18 | Disposition: A | Discharge status: Home / Self Care | Payer: Medicaid Other | Source: Ambulatory Visit | Attending: Hematology and Oncology | Admitting: Hematology and Oncology

## 2015-11-18 ENCOUNTER — Ambulatory Visit: Payer: Medicaid Other

## 2015-11-18 ENCOUNTER — Other Ambulatory Visit (HOSPITAL_BASED_OUTPATIENT_CLINIC_OR_DEPARTMENT_OTHER): Payer: Medicaid Other

## 2015-11-18 DIAGNOSIS — C711 Malignant neoplasm of frontal lobe: Secondary | ICD-10-CM | POA: Insufficient documentation

## 2015-11-18 DIAGNOSIS — Z9889 Other specified postprocedural states: Secondary | ICD-10-CM | POA: Diagnosis not present

## 2015-11-18 LAB — COMPREHENSIVE METABOLIC PANEL
ALT: 23 U/L (ref 0–55)
AST: 24 U/L (ref 5–34)
Albumin: 4.1 g/dL (ref 3.5–5.0)
Alkaline Phosphatase: 41 U/L (ref 40–150)
Anion Gap: 7 mEq/L (ref 3–11)
BUN: 11.3 mg/dL (ref 7.0–26.0)
CO2: 27 mEq/L (ref 22–29)
Calcium: 9.3 mg/dL (ref 8.4–10.4)
Chloride: 108 mEq/L (ref 98–109)
Creatinine: 0.8 mg/dL (ref 0.6–1.1)
EGFR: >90 mL/min/{1.73_m2} (ref >90)
Glucose: 85 mg/dl (ref 70–140)
Potassium: 4.1 mEq/L (ref 3.5–5.1)
Sodium: 142 mEq/L (ref 136–145)
Total Bilirubin: 0.41 mg/dL (ref 0.20–1.20)
Total Protein: 7.1 g/dL (ref 6.4–8.3)

## 2015-11-18 LAB — CBC WITH DIFFERENTIAL/PLATELET
BASO%: 0.4 % (ref 0.0–2.0)
Basophils Absolute: 0 10*3/uL (ref 0.0–0.1)
EOS%: 0.8 % (ref 0.0–7.0)
Eosinophils Absolute: 0 10*3/uL (ref 0.0–0.5)
HCT: 34.6 % — ABNORMAL LOW (ref 34.8–46.6)
HGB: 12.1 g/dL (ref 11.6–15.9)
LYMPH%: 28.2 % (ref 14.0–49.7)
MCH: 34 pg (ref 25.1–34.0)
MCHC: 35 g/dL (ref 31.5–36.0)
MCV: 97.2 fL (ref 79.5–101.0)
MONO#: 0.3 10*3/uL (ref 0.1–0.9)
MONO%: 6.6 % (ref 0.0–14.0)
NEUT#: 3.1 10*3/uL (ref 1.5–6.5)
NEUT%: 64 % (ref 38.4–76.8)
Platelets: 153 10*3/uL (ref 145–400)
RBC: 3.56 10*6/uL — ABNORMAL LOW (ref 3.70–5.45)
RDW: 13.5 % (ref 11.2–14.5)
WBC: 4.9 10*3/uL (ref 3.9–10.3)
lymph#: 1.4 10*3/uL (ref 0.9–3.3)

## 2015-11-18 MED ORDER — GADOBENATE DIMEGLUMINE 529 MG/ML IV SOLN
15.0000 mL | Freq: Once | INTRAVENOUS | Status: AC | PRN
  Administered 2015-11-18: 15 mL via INTRAVENOUS

## 2015-11-18 MED ORDER — SODIUM CHLORIDE 0.9 % IJ SOLN
10.0000 mL | INTRAMUSCULAR | Status: DC | PRN
  Administered 2015-11-18: 10 mL via INTRAVENOUS
  Filled 2015-11-18: qty 10

## 2015-11-19 ENCOUNTER — Telehealth: Payer: Self-pay | Admitting: Hematology and Oncology

## 2015-11-19 ENCOUNTER — Encounter: Payer: Self-pay | Admitting: Hematology and Oncology

## 2015-11-19 ENCOUNTER — Ambulatory Visit (HOSPITAL_BASED_OUTPATIENT_CLINIC_OR_DEPARTMENT_OTHER): Payer: Medicaid Other | Admitting: Hematology and Oncology

## 2015-11-19 VITALS — BP 161/93 | HR 88 | Temp 98.5°F | Resp 18 | Wt 164.4 lb

## 2015-11-19 DIAGNOSIS — C711 Malignant neoplasm of frontal lobe: Secondary | ICD-10-CM

## 2015-11-19 DIAGNOSIS — G44021 Chronic cluster headache, intractable: Secondary | ICD-10-CM

## 2015-11-19 DIAGNOSIS — R4702 Dysphasia: Secondary | ICD-10-CM

## 2015-11-19 DIAGNOSIS — R110 Nausea: Secondary | ICD-10-CM

## 2015-11-19 DIAGNOSIS — R519 Headache, unspecified: Secondary | ICD-10-CM

## 2015-11-19 DIAGNOSIS — R51 Headache: Secondary | ICD-10-CM

## 2015-11-19 DIAGNOSIS — R0789 Other chest pain: Secondary | ICD-10-CM

## 2015-11-19 DIAGNOSIS — R202 Paresthesia of skin: Secondary | ICD-10-CM

## 2015-11-19 MED ORDER — CARISOPRODOL 350 MG PO TABS: 350.0000 mg | ORAL_TABLET | Freq: Four times a day (QID) | ORAL | Status: DC

## 2015-11-19 MED ORDER — BUTALBITAL-APAP-CAFFEINE 50-325-40 MG PO TABS: ORAL_TABLET | ORAL | Status: DC

## 2015-11-19 MED ORDER — HYDROCODONE-ACETAMINOPHEN 10-325 MG PO TABS: 1.0000 | ORAL_TABLET | Freq: Four times a day (QID) | ORAL | Status: DC | PRN

## 2015-11-19 NOTE — Telephone Encounter (Signed)
Gave pt avs °

## 2015-11-19 NOTE — Progress Notes (Signed)
[facility] OFFICE PROGRESS NOTE  Patient Care Team: Lavone Orn, MD as PCP - General (Internal Medicine) Ashok Pall, MD as Consulting Physician (Neurosurgery) Burnell Blanks. Salomon Fick, MD as Consulting Physician (Neurosurgery) Burnell Blanks. 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   09/27/2013 Pathology Results Accession: [id number] pathology showed grade 2 oligodendroglioma with mutant IDH1 protein, ATRX, OLIG 2 expression. p53 is negative, low Ki-67 labeling index. 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   11/06/2013 Imaging Repeat CT scan of the head showed resolution of abscess.   01/23/2014 Surgery Dr. Christella Noa perform a subtotal gross resection of the tumor.   01/24/2014 Imaging Repeat imaging studies showed persistent and residual disease.   03/03/2014 - 09/01/2014 Chemotherapy She was started on Temodar, 5 days every 28 days   03/13/2014 Imaging Repeat MRI showed no evidence of progression of disease.   07/14/2014 Imaging Repeat MRI showed no evidence of disease progression.   09/12/2014 Imaging MRI of the liver show mild regression of the size of lesions, presumed to be benign   09/15/2014 Imaging Repeat MRI of the brain show persistent abnormalities, suspicious for residual disease   03/02/2015 Imaging  MRI brain at [facility] show persistent abnormalities   08/10/2015 Imaging Repeat MRI [facility] showed stable disease   08/24/2015 -  Chemotherapy She was started on Lomustine, Procarbazine and Vincristine chemotherapy   08/30/2015 Imaging CT chest showed no evidence of aspiration pneumonia   11/18/2015 Imaging MRI brain showed status post resection of right frontal lobe tumor without residual or recurrent  tumor. 2. Otherwise negative MRI of the brain.     INTERVAL HISTORY: Please see below for problem oriented charting. She returns to review test results. She continues to have intermittent headaches and seizures. The frequency of seizures I reduce since she was started on lorazepam for nausea. She denies further nausea or vomiting since discontinuation of chemotherapy. She had recent cold like illness with mild persistent cough but overall she is improving. She denies new neurological deficits.  REVIEW OF SYSTEMS:   Constitutional: Denies fevers, chills or abnormal weight loss Eyes: Denies blurriness of vision Ears, nose, mouth, throat, and face: Denies mucositis or sore throat Cardiovascular: Denies palpitation, chest discomfort or lower extremity swelling Skin: Denies abnormal skin rashes Lymphatics: Denies new lymphadenopathy or easy bruising Neurological:Denies numbness, tingling or new weaknesses Behavioral/Psych: Mood is stable, no new changes  All other systems were reviewed with the patient and are negative.  I have reviewed the past medical history, past surgical history, social history and family history with the patient and they are unchanged from previous note.  ALLERGIES:  is allergic to adhesive; latex; percocet; shellfish allergy; skin adhesives; soma; tramadol hcl; and vancomycin.  MEDICATIONS:  Current Outpatient Prescriptions  Medication Sig Dispense Refill  . albuterol (PROVENTIL HFA;VENTOLIN HFA) 108 (90 BASE) MCG/ACT inhaler Inhale 2 puffs into the lungs every 6 (six) hours as needed for wheezing or shortness of breath. Reported on 08/11/2015    . butalbital-acetaminophen-caffeine (FIORICET, ESGIC) 50-325-40 MG tablet TAKE 1 TABLET BY MOUTH EVERY 6 HOURS AS NEEDED FOR HEADACE. 90 tablet 0  . carisoprodol (SOMA) 350 MG tablet Take 1 tablet (350 mg total) by mouth 4 (four) times daily. For seizure prevention 90 tablet 5  .  cetirizine (ZYRTEC) 10 MG tablet Take 10 mg  by mouth daily.    Marland Kitchen dicyclomine (BENTYL) 20 MG tablet Take 20 mg by mouth at bedtime as needed and may repeat dose one time if needed for spasms.    . divalproex (DEPAKOTE ER) 250 MG 24 hr tablet TAKE 2 TABLETS BY MOUTH IN THE MORNING AND 3 TABLETS EVERY EVENING 450 tablet 1  . gabapentin (NEURONTIN) 300 MG capsule TAKE 3 CAPSULES BY MOUTH IN THE MORNING AND 3 CAPSULES IN THE EVENING 540 capsule 1  . HYDROcodone-acetaminophen (NORCO) 10-325 MG tablet Take 1 tablet by mouth every 6 (six) hours as needed. 90 tablet 0  . lidocaine-prilocaine (EMLA) cream Apply 1 application topically as needed. 30 g 6  . lomustine (CEENU) 100 MG capsule Take 2 capsules (200 mg total) by mouth once. Take on an emtpy stomach 1 hour before or 2 hours after meals. Caution:chemotherapy 2 capsule 0  . lomustine (CEENU) 40 MG capsule Take 1 capsule (40 mg total) by mouth once. Take on an empty stomach 1 hour before or 2 hours after meals. Caution:chemotherapy. 1 capsule 0  . LORazepam (ATIVAN) 0.5 MG tablet Take 1 tablet (0.5 mg total) by mouth every 8 (eight) hours as needed for anxiety or seizure. 90 tablet 0  . morphine (MSIR) 15 MG tablet Take 1 tablet (15 mg total) by mouth every 6 (six) hours as needed for severe pain. 90 tablet 0  . ondansetron (ZOFRAN ODT) 8 MG disintegrating tablet Take 1 tablet (8 mg total) by mouth every 8 (eight) hours as needed for nausea or vomiting. 90 tablet 3  . ondansetron (ZOFRAN) 4 MG tablet Take 4 mg by mouth every 8 (eight) hours as needed for nausea or vomiting. Reported on 09/21/2015    . procarbazine (MATULANE) 50 MG capsule Take 2 capsules (100 mg total) by mouth daily. Follow low tyramine diet. Take for 14 days starting on Day 8. Repeat every 6 weeks 28 capsule 0  . promethazine (PHENERGAN) 25 MG tablet Take 25 mg by mouth every 6 (six) hours as needed for nausea or vomiting. Reported on 09/21/2015    . scopolamine (TRANSDERM-SCOP) 1 MG/3DAYS Place 1 patch (1.5 mg total) onto the skin  every 3 (three) days. 10 patch 12  . zolmitriptan (ZOMIG) 5 MG tablet Take 5 mg by mouth as needed for migraine. Reported on 11/19/2015     No current facility-administered medications for this visit.    PHYSICAL EXAMINATION: ECOG PERFORMANCE STATUS: 1 - Symptomatic but completely ambulatory  Filed Vitals:   11/19/15 1136  BP: 161/93  Pulse: 88  Temp: 98.5 F (36.9 C)  Resp: 18   Filed Weights   11/19/15 1136  Weight: 164 lb 6.4 oz (74.571 kg)    GENERAL:alert, no distress and comfortable SKIN: skin color, texture, turgor are normal, no rashes or significant lesions EYES: normal, Conjunctiva are pink and non-injected, sclera clear Musculoskeletal:no cyanosis of digits and no clubbing  NEURO: alert & oriented x 3 with fluent speech, no focal motor/sensory deficits  LABORATORY DATA:  I have reviewed the data as listed    Component Value Date/Time   NA 142 11/18/2015 1536   NA 141 10/02/2015 1215   K 4.1 11/18/2015 1536   K 3.9 10/02/2015 1215   CL 105 10/02/2015 1215   CO2 27 11/18/2015 1536   CO2 27 10/02/2015 1215   GLUCOSE 85 11/18/2015 1536   GLUCOSE 84 10/02/2015 1215   BUN 11.3 11/18/2015 1536  BUN 9 10/02/2015 1215   CREATININE 0.8 11/18/2015 1536   CREATININE 0.60 10/02/2015 1215   CALCIUM 9.3 11/18/2015 1536   CALCIUM 9.3 10/02/2015 1215   PROT 7.1 11/18/2015 1536   PROT 6.7 08/30/2015 0105   ALBUMIN 4.1 11/18/2015 1536   ALBUMIN 4.3 08/30/2015 0105   AST 24 11/18/2015 1536   AST 22 08/30/2015 0105   ALT 23 11/18/2015 1536   ALT 30 08/30/2015 0105   ALKPHOS 41 11/18/2015 1536   ALKPHOS 38 08/30/2015 0105   BILITOT 0.41 11/18/2015 1536   BILITOT 0.1* 08/30/2015 0105   GFRNONAA >60 10/02/2015 1215   GFRAA >60 10/02/2015 1215    No results found for: SPEP, UPEP  Lab Results  Component Value Date   WBC 4.9 11/18/2015   NEUTROABS 3.1 11/18/2015   HGB 12.1 11/18/2015   HCT 34.6* 11/18/2015   MCV 97.2 11/18/2015   PLT 153 11/18/2015       Chemistry      Component Value Date/Time   NA 142 11/18/2015 1536   NA 141 10/02/2015 1215   K 4.1 11/18/2015 1536   K 3.9 10/02/2015 1215   CL 105 10/02/2015 1215   CO2 27 11/18/2015 1536   CO2 27 10/02/2015 1215   BUN 11.3 11/18/2015 1536   BUN 9 10/02/2015 1215   CREATININE 0.8 11/18/2015 1536   CREATININE 0.60 10/02/2015 1215      Component Value Date/Time   CALCIUM 9.3 11/18/2015 1536   CALCIUM 9.3 10/02/2015 1215   ALKPHOS 41 11/18/2015 1536   ALKPHOS 38 08/30/2015 0105   AST 24 11/18/2015 1536   AST 22 08/30/2015 0105   ALT 23 11/18/2015 1536   ALT 30 08/30/2015 0105   BILITOT 0.41 11/18/2015 1536   BILITOT 0.1* 08/30/2015 0105       RADIOGRAPHIC STUDIES: I have personally reviewed the radiological images as listed and agreed with the findings in the report. Mr Jeri Cos Wo Contrast  11/18/2015  CLINICAL DATA:  Malignant neoplasm of the frontal lobe. Oligodendroglioma. EXAM: MRI HEAD WITHOUT AND WITH CONTRAST TECHNIQUE: Multiplanar, multiecho pulse sequences of the brain and surrounding structures were obtained without and with intravenous contrast. CONTRAST:  91m MULTIHANCE GADOBENATE DIMEGLUMINE 529 MG/ML IV SOLN COMPAR[facility]  [facility]. FINDINGS: The patient is status post resection of the right frontal lobe lesion. Residual enhancement along the superior and medial aspect of the resection cavity is stable. No new areas of enhancement are evident. Subcortical T2 changes adjacent to the resection cavity within the middle frontal gyrus are stable. No acute infarct or hemorrhage is present. No other focal mass lesion is evident. There is no other significant white matter disease. The ventricles are of normal size. No significant extra-axial fluid collection is present. The internal auditory canals are within normal limits bilaterally. The brainstem is unremarkable. The globes and orbits are intact. The paranasal sinuses and mastoid air  cells are clear. IMPRESSION: 1. Status post resection of right frontal lobe tumor without residual or recurrent tumor. 2. Otherwise negative MRI of the brain. Electronically Signed   By: CSan MorelleM.D.   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 Overall, there is not appreciated changes in the MRI. I am concerned about side effects from treatment. The MRI is reported with no  residual disease although persistent abnormalities are noted on the MRI. I recommend presenting her imaging study at the brain tumor board before we proceed further with chemotherapy and she agreed with the plan of care.  Intractable chronic cluster headache She has chronic headache which is multifactorial, related to prior resection with neuropathic pain component We will continue conservative management with her current medications.  I refill multiple prescriptions   No orders of the defined types were placed in this encounter.   All questions were answered. The patient knows to call the clinic with any problems, questions or concerns. No barriers to learning was detected. I spent 15 minutes counseling the patient face to face. The total time spent in the appointment was 20 minutes and more than 50% was on counseling and review of test results     [facility], Francina Beery, MD 11/19/2015 1:37 PM

## 2015-11-19 NOTE — Assessment & Plan Note (Signed)
I review multiple MRI imaging with the patient. Overall, there is not appreciated changes in the MRI. I am concerned about side effects from treatment. The MRI is reported with no residual disease although persistent abnormalities are noted on the MRI. I recommend presenting her imaging study at the brain tumor board before we proceed further with chemotherapy and she agreed with the plan of care.

## 2015-11-19 NOTE — Assessment & Plan Note (Signed)
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  I refill multiple prescriptions

## 2015-11-20 ENCOUNTER — Encounter: Payer: Self-pay | Admitting: Hematology and Oncology

## 2015-11-20 ENCOUNTER — Ambulatory Visit (HOSPITAL_COMMUNITY): Payer: Medicaid Other

## 2015-11-24 ENCOUNTER — Encounter: Payer: Self-pay | Admitting: Hematology and Oncology

## 2015-11-24 ENCOUNTER — Telehealth: Payer: Self-pay | Admitting: *Deleted

## 2015-11-24 NOTE — Telephone Encounter (Signed)
Informed pt of Brain Tumor Board recommendation per Dr. Alvy Bimler to resume her treatment.  Dr. Alvy Bimler recommends pt can resume today by taking CEENU today and then schedule for lab/ Vincristine next week 5/15 and lab/MD on 5/22.   Pt asks if ok w/ Dr. Alvy Bimler she wants to wait to start day one (CEENU) of next cycle until 5/22.   Pt has her son's Birthday and Sister's wedding coming up and wants to feel good for those events.   Informed pt will check w/ Dr. Alvy Bimler and call her back.

## 2015-11-25 ENCOUNTER — Telehealth: Payer: Self-pay | Admitting: Hematology and Oncology

## 2015-11-25 ENCOUNTER — Telehealth: Payer: Self-pay | Admitting: *Deleted

## 2015-11-25 NOTE — Telephone Encounter (Signed)
Several phone calls and discussions w/ pt about her schedule,  Pt has decided to start next cycle on 5/24.  She will take CEENU on 5/24.  POF sent to schedule pt for Lab/Flush/Dr. Alvy Bimler at 11:30 am/ Vincristine on 5/31.

## 2015-11-25 NOTE — Telephone Encounter (Signed)
How about she takes CCNU on 5/24 and vincristine and see me on 5/31? I can see her at 1130 am on 5/31

## 2015-11-25 NOTE — Telephone Encounter (Signed)
s.w. pt and advised on may appt...ok and aware

## 2015-12-02 ENCOUNTER — Telehealth: Payer: Self-pay | Admitting: *Deleted

## 2015-12-02 DIAGNOSIS — C711 Malignant neoplasm of frontal lobe: Secondary | ICD-10-CM

## 2015-12-02 MED ORDER — LORAZEPAM 0.5 MG PO TABS: 0.5000 mg | ORAL_TABLET | Freq: Three times a day (TID) | ORAL | Status: DC | PRN

## 2015-12-02 MED ORDER — MORPHINE SULFATE 15 MG PO TABS: 15.0000 mg | ORAL_TABLET | Freq: Four times a day (QID) | ORAL | Status: DC | PRN

## 2015-12-02 NOTE — Telephone Encounter (Signed)
Pt left VM requests refill on Ativan called into pharmacy.  She asks if Refills can be on Rx so she doesn't have to request new refill each month?   She also needs refill on Morphine and will pick that up on Friday.

## 2015-12-02 NOTE — Telephone Encounter (Signed)
Called in ativan w/ [facility] additional [facility].  Rx ready for MD signature.  Notified pt of [facility]ivan [facility] and Rx for MS will be ready to pick up on Friday.  She verbalized understanding.

## 2015-12-02 NOTE — Telephone Encounter (Signed)
OK to refill both Not sure if they allow refills on Ativan but we can try

## 2015-12-04 ENCOUNTER — Other Ambulatory Visit: Payer: Self-pay | Admitting: *Deleted

## 2015-12-04 MED ORDER — BUTALBITAL-APAP-CAFFEINE 50-325-40 MG PO TABS: ORAL_TABLET | ORAL | Status: DC

## 2015-12-04 MED ORDER — LORAZEPAM 0.5 MG PO TABS: 0.5000 mg | ORAL_TABLET | Freq: Three times a day (TID) | ORAL | Status: DC | PRN

## 2015-12-04 MED FILL — MORPHINE SULFATE IR 15 MG T: 15 | 22 days supply | Qty: 90 | Fill #0

## 2015-12-04 NOTE — Telephone Encounter (Signed)
Prescription for [facility]ivan cancelled at [facility], phoned into [place]

## 2015-12-08 MED FILL — BUTALB-ACETAMIN-CAFF 50-325: 50-325-40 | 22 days supply | Qty: 90 | Fill #0

## 2015-12-08 MED FILL — LORazepam 0.5 MG TABS: 0.5 | 30 days supply | Qty: 90 | Fill #0

## 2015-12-15 ENCOUNTER — Telehealth: Payer: Self-pay | Admitting: *Deleted

## 2015-12-15 NOTE — Telephone Encounter (Signed)
Pt LVM today stating she had talked to Dr. Laurann Montana or someone at his office today about her Alprazolam.  She has questions for Dr. Alvy Bimler about it.  She was not specific, but pt sees Dr. Alvy Bimler tomorrow so she will discuss with her tomorrow.  Pt said it wasn't urgent and no need to call her back today.

## 2015-12-16 ENCOUNTER — Ambulatory Visit (HOSPITAL_BASED_OUTPATIENT_CLINIC_OR_DEPARTMENT_OTHER): Payer: Medicaid Other

## 2015-12-16 ENCOUNTER — Encounter: Payer: Self-pay | Admitting: Hematology and Oncology

## 2015-12-16 ENCOUNTER — Encounter: Payer: Self-pay | Admitting: Pharmacist

## 2015-12-16 ENCOUNTER — Ambulatory Visit (HOSPITAL_BASED_OUTPATIENT_CLINIC_OR_DEPARTMENT_OTHER): Payer: Medicaid Other | Admitting: Hematology and Oncology

## 2015-12-16 ENCOUNTER — Other Ambulatory Visit (HOSPITAL_BASED_OUTPATIENT_CLINIC_OR_DEPARTMENT_OTHER): Payer: Medicaid Other

## 2015-12-16 ENCOUNTER — Telehealth: Payer: Self-pay | Admitting: Hematology and Oncology

## 2015-12-16 ENCOUNTER — Ambulatory Visit: Payer: Medicaid Other

## 2015-12-16 VITALS — BP 127/93 | HR 108 | Temp 98.3°F | Resp 20 | Ht 69.0 in | Wt 160.7 lb

## 2015-12-16 DIAGNOSIS — D696 Thrombocytopenia, unspecified: Secondary | ICD-10-CM

## 2015-12-16 DIAGNOSIS — C711 Malignant neoplasm of frontal lobe: Secondary | ICD-10-CM

## 2015-12-16 DIAGNOSIS — J4530 Mild persistent asthma, uncomplicated: Secondary | ICD-10-CM | POA: Diagnosis not present

## 2015-12-16 DIAGNOSIS — G44021 Chronic cluster headache, intractable: Secondary | ICD-10-CM

## 2015-12-16 DIAGNOSIS — Z5111 Encounter for antineoplastic chemotherapy: Secondary | ICD-10-CM

## 2015-12-16 DIAGNOSIS — R0600 Dyspnea, unspecified: Secondary | ICD-10-CM

## 2015-12-16 LAB — CBC WITH DIFFERENTIAL/PLATELET
BASO%: 0.5 % (ref 0.0–2.0)
Basophils Absolute: 0 10*3/uL (ref 0.0–0.1)
EOS%: 0.4 % (ref 0.0–7.0)
Eosinophils Absolute: 0 10*3/uL (ref 0.0–0.5)
HCT: 35.8 % (ref 34.8–46.6)
HGB: 12.4 g/dL (ref 11.6–15.9)
LYMPH%: 19.6 % (ref 14.0–49.7)
MCH: 34.5 pg — ABNORMAL HIGH (ref 25.1–34.0)
MCHC: 34.7 g/dL (ref 31.5–36.0)
MCV: 99.5 fL (ref 79.5–101.0)
MONO#: 0.2 10*3/uL (ref 0.1–0.9)
MONO%: 4.5 % (ref 0.0–14.0)
NEUT#: 3.1 10*3/uL (ref 1.5–6.5)
NEUT%: 75 % (ref 38.4–76.8)
Platelets: 133 10*3/uL — ABNORMAL LOW (ref 145–400)
RBC: 3.6 10*6/uL — ABNORMAL LOW (ref 3.70–5.45)
RDW: 11.2 % (ref 11.2–14.5)
WBC: 4.2 10*3/uL (ref 3.9–10.3)
lymph#: 0.8 10*3/uL — ABNORMAL LOW (ref 0.9–3.3)

## 2015-12-16 LAB — COMPREHENSIVE METABOLIC PANEL
ALT: 46 U/L (ref 0–55)
AST: 35 U/L — ABNORMAL HIGH (ref 5–34)
Albumin: 4.2 g/dL (ref 3.5–5.0)
Alkaline Phosphatase: 39 U/L — ABNORMAL LOW (ref 40–150)
Anion Gap: 9 mEq/L (ref 3–11)
BUN: 8.2 mg/dL (ref 7.0–26.0)
CO2: 25 mEq/L (ref 22–29)
Calcium: 9.2 mg/dL (ref 8.4–10.4)
Chloride: 105 mEq/L (ref 98–109)
Creatinine: 0.7 mg/dL (ref 0.6–1.1)
EGFR: >90 mL/min/{1.73_m2} (ref >90)
Glucose: 96 mg/dl (ref 70–140)
Potassium: 3.8 mEq/L (ref 3.5–5.1)
Sodium: 139 mEq/L (ref 136–145)
Total Bilirubin: 0.47 mg/dL (ref 0.20–1.20)
Total Protein: 7.1 g/dL (ref 6.4–8.3)

## 2015-12-16 MED ORDER — VINCRISTINE SULFATE CHEMO INJECTION 1 MG/ML
1.0000 mg | Freq: Once | INTRAVENOUS | Status: AC
  Administered 2015-12-16: 1 mg via INTRAVENOUS
  Filled 2015-12-16: qty 1

## 2015-12-16 MED ORDER — SODIUM CHLORIDE 0.9 % IV SOLN
Freq: Once | INTRAVENOUS | Status: AC
  Administered 2015-12-16: 13:00:00 via INTRAVENOUS

## 2015-12-16 MED ORDER — ONDANSETRON HCL 8 MG PO TABS: 8.0000 mg | ORAL_TABLET | Freq: Three times a day (TID) | ORAL | Status: DC | PRN

## 2015-12-16 MED ORDER — PALONOSETRON HCL INJECTION 0.25 MG/5ML
0.2500 mg | Freq: Once | INTRAVENOUS | Status: AC
  Administered 2015-12-16: 0.25 mg via INTRAVENOUS

## 2015-12-16 MED ORDER — HEPARIN SOD (PORK) LOCK FLUSH 100 UNIT/ML IV SOLN
500.0000 [IU] | Freq: Once | INTRAVENOUS | Status: AC | PRN
  Administered 2015-12-16: 500 [IU]
  Filled 2015-12-16: qty 5

## 2015-12-16 MED ORDER — SODIUM CHLORIDE 0.9 % IJ SOLN
10.0000 mL | INTRAMUSCULAR | Status: DC | PRN
  Administered 2015-12-16: 10 mL via INTRAVENOUS
  Filled 2015-12-16: qty 10

## 2015-12-16 MED ORDER — SODIUM CHLORIDE 0.9% FLUSH
10.0000 mL | INTRAVENOUS | Status: DC | PRN
  Administered 2015-12-16: 10 mL
  Filled 2015-12-16: qty 10

## 2015-12-16 MED ORDER — PALONOSETRON HCL INJECTION 0.25 MG/5ML
INTRAVENOUS | Status: AC
  Filled 2015-12-16: qty 5

## 2015-12-16 MED ORDER — HYDROCODONE-ACETAMINOPHEN 10-325 MG PO TABS: 1.0000 | ORAL_TABLET | Freq: Four times a day (QID) | ORAL | Status: DC | PRN

## 2015-12-16 NOTE — Assessment & Plan Note (Signed)
This is likely due to recent treatment. The patient denies recent history of bleeding such as epistaxis, hematuria or hematochezia. She is asymptomatic from the low platelet count. I will observe for now.  she does not require transfusion now. I will continue the chemotherapy at current dose without dosage adjustment.  If the thrombocytopenia gets progressive worse in the future, I might have to delay her treatment or adjust the chemotherapy dose.   

## 2015-12-16 NOTE — Telephone Encounter (Signed)
Gave and printed appt sched and avs for pt for June..the patient did not want to sched pft

## 2015-12-16 NOTE — Patient Instructions (Signed)

## 2015-12-16 NOTE — Assessment & Plan Note (Signed)
The patient has diagnosis of asthma. She had recent acute asthma exacerbation and was taking a lot of albuterol frequently With albuterol, she has significant tachycardia. I am concerned about this. It is not clear whether she may have side effects from chemotherapy. I will order a full pulmonary function test next week and will see her back to review test results.

## 2015-12-16 NOTE — Progress Notes (Signed)
Oral Chemotherapy Follow-Up Form  Original Start date of oral chemotherapy: 08/24/15   I s/w patient today to follow up regarding patient's oral chemotherapy medication: CCNU + Procarbazine She took CCNU on 12/09/15.  Today (day 8) she begins 14 days of Procarbazine. She is here today for Vincristine infusion.  Pt reports the following side effects:  SOB - consistently having breathing "issues."  Describes sxs as "tight when moving."  Dr. Alvy Bimler is getting PFT's.  We will d/c phone call follow ups.  Pt agrees w/ that plan and is appreciative of our help to this point.  She has our contact info if needed.  Thank you, Kennith Center, Pharm.D., CPP 12/16/2015@12 :26 PM Oral Chemotherapy Clinic

## 2015-12-16 NOTE — Progress Notes (Signed)
[facility] OFFICE PROGRESS NOTE  Patient Care Team: Lavone Orn, MD as PCP - General (Internal Medicine) Ashok Pall, MD as Consulting Physician (Neurosurgery) Burnell Blanks. Salomon Fick, MD as Consulting Physician (Neurosurgery) Burnell Blanks. 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   09/27/2013 Pathology Results Accession: [id number] pathology showed grade 2 oligodendroglioma with mutant IDH1 protein, ATRX, OLIG 2 expression. p53 is negative, low Ki-67 labeling index. 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   11/06/2013 Imaging Repeat CT scan of the head showed resolution of abscess.   01/23/2014 Surgery Dr. Christella Noa perform a subtotal gross resection of the tumor.   01/24/2014 Imaging Repeat imaging studies showed persistent and residual disease.   03/03/2014 - 09/01/2014 Chemotherapy She was started on Temodar, 5 days every 28 days   03/13/2014 Imaging Repeat MRI showed no evidence of progression of disease.   07/14/2014 Imaging Repeat MRI showed no evidence of disease progression.   09/12/2014 Imaging MRI of the liver show mild regression of the size of lesions, presumed to be benign   09/15/2014 Imaging Repeat MRI of the brain show persistent abnormalities, suspicious for residual disease   03/02/2015 Imaging  MRI brain at [place] show persistent abnormalities   08/10/2015 Imaging Repeat MRI [facility] showed stable disease   08/24/2015 -  Chemotherapy She was started on Lomustine, Procarbazine and Vincristine chemotherapy   08/30/2015 Imaging CT chest showed no evidence of aspiration pneumonia   11/18/2015 Imaging MRI brain showed status post resection of right frontal lobe tumor without residual or recurrent  tumor. 2. Otherwise negative MRI of the brain.     INTERVAL HISTORY: Please see below for problem oriented charting. She returns for chemotherapy today. She denies recent nausea or vomiting. She is concerned about significant shortness of breath recently and was taking albuterol. It is not clear whether there is any association between her shortness of breath and her chemotherapy. The patient has diagnosis of asthma in the past. Albuterol was helping with her shortness breath but then it causes significant tachycardia and problem with his sleep. She denies worsening seizures. No new neurological deficit. Denies worsening neuropathy or headaches.  REVIEW OF SYSTEMS:   Constitutional: Denies fevers, chills or abnormal weight loss Eyes: Denies blurriness of vision Ears, nose, mouth, throat, and face: Denies mucositis or sore throat Cardiovascular: Denies palpitation, chest discomfort or lower extremity swelling Gastrointestinal:  Denies nausea, heartburn or change in bowel habits Skin: Denies abnormal skin rashes Lymphatics: Denies new lymphadenopathy or easy bruising Neurological:Denies numbness, tingling or new weaknesses Behavioral/Psych: Mood is stable, no new changes  All other systems were reviewed with the patient and are negative.  I have reviewed the past medical history, past surgical history, social history and family history with the patient and they are unchanged from previous note.  ALLERGIES:  is allergic to adhesive; latex; percocet; shellfish allergy; skin adhesives; soma; tramadol hcl; and vancomycin.  MEDICATIONS:  Current Outpatient Prescriptions  Medication Sig Dispense Refill  . albuterol (PROVENTIL HFA;VENTOLIN HFA) 108 (90 BASE) MCG/ACT inhaler Inhale 2 puffs into the lungs every 6 (six) hours as needed for wheezing or shortness of breath. Reported on 08/11/2015    . butalbital-acetaminophen-caffeine (FIORICET, ESGIC) 50-325-40 MG tablet TAKE 1 TABLET BY MOUTH  EVERY  6 HOURS AS NEEDED FOR HEADACE. 90 tablet 1  . carisoprodol (SOMA) 350 MG tablet Take 1 tablet (350 mg total) by mouth 4 (four) times daily. For seizure prevention 90 tablet 5  . cetirizine (ZYRTEC) 10 MG tablet Take 10 mg by mouth daily.    . divalproex (DEPAKOTE ER) 250 MG 24 hr tablet TAKE 2 TABLETS BY MOUTH IN THE MORNING AND 3 TABLETS EVERY EVENING 450 tablet 1  . gabapentin (NEURONTIN) 300 MG capsule TAKE 3 CAPSULES BY MOUTH IN THE MORNING AND 3 CAPSULES IN THE EVENING 540 capsule 1  . HYDROcodone-acetaminophen (NORCO) 10-325 MG tablet Take 1 tablet by mouth every 6 (six) hours as needed. 90 tablet 0  . lidocaine-prilocaine (EMLA) cream Apply 1 application topically as needed. 30 g 6  . lomustine (CEENU) 100 MG capsule Take 2 capsules (200 mg total) by mouth once. Take on an emtpy stomach 1 hour before or 2 hours after meals. Caution:chemotherapy 2 capsule 0  . lomustine (CEENU) 40 MG capsule Take 1 capsule (40 mg total) by mouth once. Take on an empty stomach 1 hour before or 2 hours after meals. Caution:chemotherapy. 1 capsule 0  . LORazepam (ATIVAN) 0.5 MG tablet Take 1 tablet (0.5 mg total) by mouth every 8 (eight) hours as needed for anxiety or seizure. 90 tablet 2  . morphine (MSIR) 15 MG tablet Take 1 tablet (15 mg total) by mouth every 6 (six) hours as needed for severe pain. 90 tablet 0  . ondansetron (ZOFRAN ODT) 8 MG disintegrating tablet Take 1 tablet (8 mg total) by mouth every 8 (eight) hours as needed for nausea or vomiting. 90 tablet 3  . procarbazine (MATULANE) 50 MG capsule Take 2 capsules (100 mg total) by mouth daily. Follow low tyramine diet. Take for 14 days starting on Day 8. Repeat every 6 weeks 28 capsule 0  . promethazine (PHENERGAN) 25 MG tablet Take 25 mg by mouth every 6 (six) hours as needed for nausea or vomiting. Reported on 09/21/2015    . dicyclomine (BENTYL) 20 MG tablet Take 20 mg by mouth at bedtime as needed and may repeat dose one time if needed for  spasms. Reported on 12/16/2015    . ondansetron (ZOFRAN) 4 MG tablet Take 4 mg by mouth every 8 (eight) hours as needed for nausea or vomiting. Reported on 12/16/2015    . ondansetron (ZOFRAN) 8 MG tablet Take 1 tablet (8 mg total) by mouth every 8 (eight) hours as needed for nausea. 90 tablet 3   No current facility-administered medications for this visit.   Facility-Administered Medications Ordered in Other Visits  Medication Dose Route Frequency Provider Last Rate Last Dose  . sodium chloride flush (NS) 0.9 % injection 10 mL  10 mL Intracatheter PRN Heath Lark, MD      . vinCRIStine (ONCOVIN) 1 mg in sodium chloride 0.9 % 50 mL chemo infusion  1 mg Intravenous Once Heath Lark, MD        PHYSICAL EXAMINATION: ECOG PERFORMANCE STATUS: 1 - Symptomatic but completely ambulatory  Filed Vitals:   12/16/15 1123  BP: 127/93  Pulse: 108  Temp: 98.3 F (36.8 C)  Resp: 20   Filed Weights   12/16/15 1123  Weight: 160 lb 11.2 oz (72.893 kg)    GENERAL:alert, no distress and comfortable SKIN: skin color, texture, turgor are normal, no rashes or significant lesions EYES: normal, Conjunctiva are pink and non-injected, sclera clear OROPHARYNX:no exudate, no erythema and lips, buccal mucosa, and tongue  normal  NECK: supple, thyroid normal size, non-tender, without nodularity LYMPH:  no palpable lymphadenopathy in the cervical, axillary or inguinal LUNGS: clear to auscultation and percussion with normal breathing effort HEART: regular rate & rhythm and no murmurs and no lower extremity edema ABDOMEN:abdomen soft, non-tender and normal bowel sounds Musculoskeletal:no cyanosis of digits and no clubbing  NEURO: alert & oriented x 3 with fluent speech, no focal motor/sensory deficits  LABORATORY DATA:  I have reviewed the data as listed    Component Value Date/Time   NA 139 12/16/2015 1104   NA 141 10/02/2015 1215   K 3.8 12/16/2015 1104   K 3.9 10/02/2015 1215   CL 105 10/02/2015 1215    CO2 25 12/16/2015 1104   CO2 27 10/02/2015 1215   GLUCOSE 96 12/16/2015 1104   GLUCOSE 84 10/02/2015 1215   BUN 8.2 12/16/2015 1104   BUN 9 10/02/2015 1215   CREATININE 0.7 12/16/2015 1104   CREATININE 0.60 10/02/2015 1215   CALCIUM 9.2 12/16/2015 1104   CALCIUM 9.3 10/02/2015 1215   PROT 7.1 12/16/2015 1104   PROT 6.7 08/30/2015 0105   ALBUMIN 4.2 12/16/2015 1104   ALBUMIN 4.3 08/30/2015 0105   AST 35* 12/16/2015 1104   AST 22 08/30/2015 0105   ALT 46 12/16/2015 1104   ALT 30 08/30/2015 0105   ALKPHOS 39* 12/16/2015 1104   ALKPHOS 38 08/30/2015 0105   BILITOT 0.47 12/16/2015 1104   BILITOT 0.1* 08/30/2015 0105   GFRNONAA >60 10/02/2015 1215   GFRAA >60 10/02/2015 1215    No results found for: SPEP, UPEP  Lab Results  Component Value Date   WBC 4.2 12/16/2015   NEUTROABS 3.1 12/16/2015   HGB 12.4 12/16/2015   HCT 35.8 12/16/2015   MCV 99.5 12/16/2015   PLT 133* 12/16/2015      Chemistry      Component Value Date/Time   NA 139 12/16/2015 1104   NA 141 10/02/2015 1215   K 3.8 12/16/2015 1104   K 3.9 10/02/2015 1215   CL 105 10/02/2015 1215   CO2 25 12/16/2015 1104   CO2 27 10/02/2015 1215   BUN 8.2 12/16/2015 1104   BUN 9 10/02/2015 1215   CREATININE 0.7 12/16/2015 1104   CREATININE 0.60 10/02/2015 1215      Component Value Date/Time   CALCIUM 9.2 12/16/2015 1104   CALCIUM 9.3 10/02/2015 1215   ALKPHOS 39* 12/16/2015 1104   ALKPHOS 38 08/30/2015 0105   AST 35* 12/16/2015 1104   AST 22 08/30/2015 0105   ALT 46 12/16/2015 1104   ALT 30 08/30/2015 0105   BILITOT 0.47 12/16/2015 1104   BILITOT 0.1* 08/30/2015 0105       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 I am concerned about side effects from treatment. The MRI is reported with no residual disease although persistent abnormalities are noted on the  MRI. Plan would be to do 2 more cycles of treatment and repeat MRI in August before she proceed with potential subtotal resection in the near future.   Thrombocytopenia ([place]) This is likely due to recent treatment. The patient denies recent history of bleeding such as epistaxis, hematuria or hematochezia. She is asymptomatic from the low platelet count. I will observe for now.  she does not require transfusion now. I will continue the chemotherapy at current dose without  dosage adjustment.  If the thrombocytopenia gets progressive worse in the future, I might have to delay her treatment or adjust the chemotherapy dose.    Asthma The patient has diagnosis of asthma. She had recent acute asthma exacerbation and was taking a lot of albuterol frequently With albuterol, she has significant tachycardia. I am concerned about this. It is not clear whether she may have side effects from chemotherapy. I will order a full pulmonary function test next week and will see her back to review test results.  Intractable chronic cluster headache She has chronic headache which is multifactorial, related to prior resection with neuropathic pain component We will continue conservative management with her current medications.  I refilled multiple prescriptions     Orders Placed This Encounter  Procedures  . Pulmonary Function Test    Standing Status: Future     Number of Occurrences:      Standing Expiration Date: 12/15/2016    Scheduling Instructions:     Need early in the morning    Order Specific Question:  Where should this test be performed?    Answer:  [facility]    Order Specific Question:  Full PFT: includes the following: basic spirometry, spirometry pre & post bronchodilator, diffusion capacity (DLCO), lung volumes    Answer:  Full PFT    Order Specific Question:  MIP/MEP    Answer:  Yes    Order Specific Question:  6 minute walk    Answer:  No    Order Specific Question:  ABG    Answer:   No    Order Specific Question:  Diffusion capacity (DLCO)    Answer:  Yes    Order Specific Question:  Lung volumes    Answer:  Yes    Order Specific Question:  Methacholine challenge    Answer:  Yes   All questions were answered. The patient knows to call the clinic with any problems, questions or concerns. No barriers to learning was detected. I spent 25 minutes counseling the patient face to face. The total time spent in the appointment was 30 minutes and more than 50% was on counseling and review of test results     [facility], [place], MD 12/16/2015 12:48 PM

## 2015-12-16 NOTE — Assessment & Plan Note (Signed)
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  I refilled multiple prescriptions

## 2015-12-16 NOTE — Assessment & Plan Note (Signed)
Recent MRI in May 2017 showed no major changes in the MRI. I am concerned about side effects from treatment. The MRI is reported with no residual disease although persistent abnormalities are noted on the MRI. Plan would be to do 2 more cycles of treatment and repeat MRI in August before she proceed with potential subtotal resection in the near future.

## 2015-12-16 NOTE — Patient Instructions (Signed)
[facility] Discharge Instructions for Patients Receiving Chemotherapy  Today you received the following chemotherapy agents Vincristine. To help prevent nausea and vomiting after your treatment, we encourage you to take your nausea medication as directed.NO ZOFRAN FOR 3 DAYS. If you develop nausea and vomiting that is not controlled by your nausea medication, call the clinic.   BELOW ARE SYMPTOMS THAT SHOULD BE REPORTED IMMEDIATELY:  *FEVER GREATER THAN 100.5 F  *CHILLS WITH OR WITHOUT FEVER  NAUSEA AND VOMITING THAT IS NOT CONTROLLED WITH YOUR NAUSEA MEDICATION  *UNUSUAL SHORTNESS OF BREATH  *UNUSUAL BRUISING OR BLEEDING  TENDERNESS IN MOUTH AND THROAT WITH OR WITHOUT PRESENCE OF ULCERS  *URINARY PROBLEMS  *BOWEL PROBLEMS  UNUSUAL RASH Items with * indicate a potential emergency and should be followed up as soon as possible.  Feel free to call the clinic you have any questions or concerns. The clinic phone number is [phone].  Please show the [place] at check-in to the Emergency Department and triage nurse.

## 2015-12-21 MED FILL — LIDOCAINE-PRILOCAINE CREAM: 2.5-2.5 | 10 days supply | Qty: 30 | Fill #1

## 2015-12-22 ENCOUNTER — Other Ambulatory Visit (HOSPITAL_COMMUNITY): Payer: Self-pay | Admitting: Respiratory Therapy

## 2015-12-22 ENCOUNTER — Other Ambulatory Visit: Payer: Self-pay | Admitting: *Deleted

## 2015-12-22 DIAGNOSIS — J45909 Unspecified asthma, uncomplicated: Secondary | ICD-10-CM

## 2015-12-25 ENCOUNTER — Telehealth: Payer: Self-pay | Admitting: Hematology and Oncology

## 2015-12-25 ENCOUNTER — Other Ambulatory Visit: Payer: Self-pay | Admitting: *Deleted

## 2015-12-25 MED ORDER — PROCARBAZINE HCL 50 MG PO CAPS: 100.0000 mg | ORAL_CAPSULE | Freq: Every day | ORAL | Status: DC

## 2015-12-25 NOTE — Telephone Encounter (Signed)
returned call adn r/s appt....pt ok and aware

## 2015-12-28 ENCOUNTER — Encounter: Payer: Self-pay | Admitting: General Practice

## 2015-12-28 NOTE — Progress Notes (Signed)
Spiritual Care Note  Left VM with encouragement to return call.  Please also page if needs arise/circumstances change.  Thank you.  [place], [place], [facility] Pager [phone] Voicemail [phone]

## 2015-12-29 ENCOUNTER — Other Ambulatory Visit: Payer: Self-pay | Admitting: *Deleted

## 2015-12-29 ENCOUNTER — Other Ambulatory Visit: Payer: Self-pay | Admitting: Hematology and Oncology

## 2015-12-29 DIAGNOSIS — C711 Malignant neoplasm of frontal lobe: Secondary | ICD-10-CM

## 2015-12-29 MED ORDER — MORPHINE SULFATE 15 MG PO TABS: 15.0000 mg | ORAL_TABLET | Freq: Four times a day (QID) | ORAL | Status: DC | PRN

## 2015-12-29 MED FILL — BUTALB-ACETAMIN-CAFF 50-325: 50-325-40 | 22 days supply | Qty: 90 | Fill #1

## 2015-12-29 MED FILL — GLEOSTINE 40 MG CAPSULE: 40 | 1 days supply | Qty: 1 | Fill #0

## 2015-12-29 MED FILL — MORPHINE SULFATE IR 15 MG T: 15 | 22 days supply | Qty: 90 | Fill #0

## 2015-12-29 MED FILL — GLEOSTINE 100 MG CAPSULE: 100 | 1 days supply | Qty: 2 | Fill #0

## 2015-12-29 NOTE — Telephone Encounter (Signed)
Pt called for refill on MS 15 mg IR.  Notified pt Rx ready to pick up.

## 2015-12-31 MED FILL — ONDANSETRON ODT 8 MG TABLET: 8 | 30 days supply | Qty: 90 | Fill #0

## 2016-01-01 ENCOUNTER — Other Ambulatory Visit: Payer: Self-pay | Admitting: *Deleted

## 2016-01-01 ENCOUNTER — Telehealth: Payer: Self-pay | Admitting: *Deleted

## 2016-01-01 DIAGNOSIS — R0789 Other chest pain: Secondary | ICD-10-CM

## 2016-01-01 DIAGNOSIS — R519 Headache, unspecified: Secondary | ICD-10-CM

## 2016-01-01 DIAGNOSIS — C711 Malignant neoplasm of frontal lobe: Secondary | ICD-10-CM

## 2016-01-01 DIAGNOSIS — R202 Paresthesia of skin: Secondary | ICD-10-CM

## 2016-01-01 DIAGNOSIS — R51 Headache: Secondary | ICD-10-CM

## 2016-01-01 DIAGNOSIS — R110 Nausea: Secondary | ICD-10-CM

## 2016-01-01 DIAGNOSIS — R4702 Dysphasia: Secondary | ICD-10-CM

## 2016-01-01 MED ORDER — CARISOPRODOL 350 MG PO TABS: 350.0000 mg | ORAL_TABLET | Freq: Four times a day (QID) | ORAL | Status: DC

## 2016-01-01 NOTE — Telephone Encounter (Signed)
[facility] brand name soma, called [facility] per patient's request.

## 2016-01-01 NOTE — Telephone Encounter (Signed)
See previous phone note.  

## 2016-01-04 ENCOUNTER — Telehealth: Payer: Self-pay | Admitting: *Deleted

## 2016-01-04 ENCOUNTER — Encounter: Payer: Self-pay | Admitting: Hematology and Oncology

## 2016-01-04 ENCOUNTER — Ambulatory Visit: Payer: Medicaid Other

## 2016-01-04 ENCOUNTER — Other Ambulatory Visit (HOSPITAL_BASED_OUTPATIENT_CLINIC_OR_DEPARTMENT_OTHER): Payer: Medicaid Other

## 2016-01-04 ENCOUNTER — Ambulatory Visit (HOSPITAL_BASED_OUTPATIENT_CLINIC_OR_DEPARTMENT_OTHER): Payer: Medicaid Other | Admitting: Hematology and Oncology

## 2016-01-04 ENCOUNTER — Telehealth: Payer: Self-pay | Admitting: Hematology and Oncology

## 2016-01-04 ENCOUNTER — Ambulatory Visit (HOSPITAL_BASED_OUTPATIENT_CLINIC_OR_DEPARTMENT_OTHER): Payer: Medicaid Other

## 2016-01-04 VITALS — BP 111/65 | HR 67 | Temp 97.9°F | Resp 18 | Ht 69.0 in | Wt 159.6 lb

## 2016-01-04 DIAGNOSIS — D61810 Antineoplastic chemotherapy induced pancytopenia: Secondary | ICD-10-CM | POA: Diagnosis not present

## 2016-01-04 DIAGNOSIS — T451X5A Adverse effect of antineoplastic and immunosuppressive drugs, initial encounter: Secondary | ICD-10-CM

## 2016-01-04 DIAGNOSIS — G40109 Localization-related (focal) (partial) symptomatic epilepsy and epileptic syndromes with simple partial seizures, not intractable, without status epilepticus: Secondary | ICD-10-CM | POA: Diagnosis not present

## 2016-01-04 DIAGNOSIS — C711 Malignant neoplasm of frontal lobe: Secondary | ICD-10-CM | POA: Diagnosis not present

## 2016-01-04 DIAGNOSIS — Z5111 Encounter for antineoplastic chemotherapy: Secondary | ICD-10-CM

## 2016-01-04 DIAGNOSIS — J4541 Moderate persistent asthma with (acute) exacerbation: Secondary | ICD-10-CM

## 2016-01-04 LAB — COMPREHENSIVE METABOLIC PANEL
ALT: 35 U/L (ref 0–55)
AST: 32 U/L (ref 5–34)
Albumin: 4 g/dL (ref 3.5–5.0)
Alkaline Phosphatase: 37 U/L — ABNORMAL LOW (ref 40–150)
Anion Gap: 8 mEq/L (ref 3–11)
BUN: 6.8 mg/dL — ABNORMAL LOW (ref 7.0–26.0)
CO2: 27 mEq/L (ref 22–29)
Calcium: 9 mg/dL (ref 8.4–10.4)
Chloride: 107 mEq/L (ref 98–109)
Creatinine: 0.8 mg/dL (ref 0.6–1.1)
EGFR: >90 mL/min/{1.73_m2} (ref >90)
Glucose: 84 mg/dl (ref 70–140)
Potassium: 4.6 mEq/L (ref 3.5–5.1)
Sodium: 141 mEq/L (ref 136–145)
Total Bilirubin: 0.37 mg/dL (ref 0.20–1.20)
Total Protein: 6.8 g/dL (ref 6.4–8.3)

## 2016-01-04 LAB — CBC WITH DIFFERENTIAL/PLATELET
BASO%: 0.7 % (ref 0.0–2.0)
Basophils Absolute: 0 10*3/uL (ref 0.0–0.1)
EOS%: 1 % (ref 0.0–7.0)
Eosinophils Absolute: 0 10*3/uL (ref 0.0–0.5)
HCT: 33.3 % — ABNORMAL LOW (ref 34.8–46.6)
HGB: 11.3 g/dL — ABNORMAL LOW (ref 11.6–15.9)
LYMPH%: 25.8 % (ref 14.0–49.7)
MCH: 34.5 pg — ABNORMAL HIGH (ref 25.1–34.0)
MCHC: 34 g/dL (ref 31.5–36.0)
MCV: 101.3 fL — ABNORMAL HIGH (ref 79.5–101.0)
MONO#: 0.2 10*3/uL (ref 0.1–0.9)
MONO%: 8.4 % (ref 0.0–14.0)
NEUT#: 1.8 10*3/uL (ref 1.5–6.5)
NEUT%: 64.1 % (ref 38.4–76.8)
Platelets: 96 10*3/uL — ABNORMAL LOW (ref 145–400)
RBC: 3.29 10*6/uL — ABNORMAL LOW (ref 3.70–5.45)
RDW: 11.7 % (ref 11.2–14.5)
WBC: 2.9 10*3/uL — ABNORMAL LOW (ref 3.9–10.3)
lymph#: 0.7 10*3/uL — ABNORMAL LOW (ref 0.9–3.3)

## 2016-01-04 MED ORDER — SODIUM CHLORIDE 0.9 % IV SOLN
Freq: Once | INTRAVENOUS | Status: AC
  Administered 2016-01-04: 15:00:00 via INTRAVENOUS

## 2016-01-04 MED ORDER — PALONOSETRON HCL INJECTION 0.25 MG/5ML
0.2500 mg | Freq: Once | INTRAVENOUS | Status: AC
  Administered 2016-01-04: 0.25 mg via INTRAVENOUS

## 2016-01-04 MED ORDER — VINCRISTINE SULFATE CHEMO INJECTION 1 MG/ML
1.0000 mg | Freq: Once | INTRAVENOUS | Status: AC
  Administered 2016-01-04: 1 mg via INTRAVENOUS
  Filled 2016-01-04: qty 1

## 2016-01-04 MED ORDER — SODIUM CHLORIDE 0.9 % IJ SOLN
10.0000 mL | INTRAMUSCULAR | Status: DC | PRN
  Administered 2016-01-04: 10 mL via INTRAVENOUS
  Filled 2016-01-04: qty 10

## 2016-01-04 MED ORDER — HEPARIN SOD (PORK) LOCK FLUSH 100 UNIT/ML IV SOLN
500.0000 [IU] | Freq: Once | INTRAVENOUS | Status: AC | PRN
  Administered 2016-01-04: 500 [IU]
  Filled 2016-01-04: qty 5

## 2016-01-04 MED ORDER — PALONOSETRON HCL INJECTION 0.25 MG/5ML
INTRAVENOUS | Status: AC
  Filled 2016-01-04: qty 5

## 2016-01-04 MED ORDER — SODIUM CHLORIDE 0.9% FLUSH
10.0000 mL | INTRAVENOUS | Status: DC | PRN
  Administered 2016-01-04: 10 mL
  Filled 2016-01-04: qty 10

## 2016-01-04 NOTE — Telephone Encounter (Signed)
Added appt pt will ck mychart

## 2016-01-04 NOTE — Patient Instructions (Signed)
[facility] Discharge Instructions for Patients Receiving Chemotherapy  Today you received the following chemotherapy agents Vincristine  To help prevent nausea and vomiting after your treatment, we encourage you to take your nausea medication   If you develop nausea and vomiting that is not controlled by your nausea medication, call the clinic.   BELOW ARE SYMPTOMS THAT SHOULD BE REPORTED IMMEDIATELY:  *FEVER GREATER THAN 100.5 F  *CHILLS WITH OR WITHOUT FEVER  NAUSEA AND VOMITING THAT IS NOT CONTROLLED WITH YOUR NAUSEA MEDICATION  *UNUSUAL SHORTNESS OF BREATH  *UNUSUAL BRUISING OR BLEEDING  TENDERNESS IN MOUTH AND THROAT WITH OR WITHOUT PRESENCE OF ULCERS  *URINARY PROBLEMS  *BOWEL PROBLEMS  UNUSUAL RASH Items with * indicate a potential emergency and should be followed up as soon as possible.  Feel free to call the clinic you have any questions or concerns. The clinic phone number is [phone].  Please show the [place] at check-in to the Emergency Department and triage nurse.

## 2016-01-04 NOTE — Progress Notes (Signed)
Okay to treat today despite labs, per Dr. Alvy Bimler.

## 2016-01-04 NOTE — Progress Notes (Signed)
left for dr. Alvy Bimler to sign--nctraks form

## 2016-01-04 NOTE — Patient Instructions (Signed)

## 2016-01-04 NOTE — Telephone Encounter (Signed)
Pt states she [facility]. This is an 11 day supply, will last until 01/14/16  RN spoke with [facility]. They are going to get prescription back from [place], order specific brand of Manuela Neptune, obtain prior authorization and have this ready for [place] on 01/14/16

## 2016-01-05 ENCOUNTER — Encounter: Payer: Self-pay | Admitting: Hematology and Oncology

## 2016-01-05 NOTE — Assessment & Plan Note (Signed)
This is likely due to recent treatment. The patient denies recent history of bleeding such as epistaxis, hematuria or hematochezia. No recent infection. She is asymptomatic from the low platelet count and low white blood cell count. I will observe for now.  she does not require transfusion now. I will continue the chemotherapy at current dose with dose adjustment.  For the next cycle of treatment, we will delay treatment by one week to accommodate for some personal time off with family and to allow pancytopenia to recover better.

## 2016-01-05 NOTE — Assessment & Plan Note (Signed)
Recent MRI in May 2017 showed no major changes in the MRI. I am concerned about side effects from treatment. The MRI is reported with no residual disease although persistent abnormalities are noted on the MRI. Plan would be to do 2 more cycles of treatment and repeat MRI in August before she proceed with potential subtotal resection in the near future.  Due to neuropathy and uncontrolled nausea along with mild pancytopenia, she will only receive vincristine the 50% dose adjustment along with other chemotherapy dose adjustment.

## 2016-01-05 NOTE — Assessment & Plan Note (Signed)
The patient has diagnosis of asthma. She had recent acute asthma exacerbation and was taking a lot of albuterol frequently With albuterol, she has significant tachycardia. I am concerned about this. It is not clear whether she may have side effects from chemotherapy. Full pulmonary function test is scheduled and will see her back to review test results.

## 2016-01-05 NOTE — Progress Notes (Signed)
Dr Alvy Bimler had been filling carisoprodol as courtesy for patient. She has to go thru dr. Cyndy Freeze for prior [place]. I sent note to [facility]

## 2016-01-05 NOTE — Assessment & Plan Note (Signed)
The patient has recurrent seizures. The last episode of seizure was precipitated by stress from a cousin's wedding. The patient has a personal pulse oximetry which documented persistent hypoxemia during the seizure activity with oxygen saturation less than 90% for over one hour. I recommend the family members to bring her to the emergency department should this happen again to reduce long-term brain damage from hypoxemia

## 2016-01-05 NOTE — Progress Notes (Signed)
[place] OFFICE PROGRESS NOTE  Patient Care Team: Lavone Orn, MD as PCP - General (Internal Medicine) Ashok Pall, MD as Consulting Physician (Neurosurgery) Burnell Blanks. Salomon Fick, MD as Consulting Physician (Neurosurgery) Burnell Blanks. 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   09/27/2013 Pathology Results Accession: [id number] pathology showed grade 2 oligodendroglioma with mutant IDH1 protein, ATRX, OLIG 2 expression. p53 is negative, low Ki-67 labeling index. 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   11/06/2013 Imaging Repeat CT scan of the head showed resolution of abscess.   01/23/2014 Surgery Dr. Christella Noa perform a subtotal gross resection of the tumor.   01/24/2014 Imaging Repeat imaging studies showed persistent and residual disease.   03/03/2014 - 09/01/2014 Chemotherapy She was started on Temodar, 5 days every 28 days   03/13/2014 Imaging Repeat MRI showed no evidence of progression of disease.   07/14/2014 Imaging Repeat MRI showed no evidence of disease progression.   09/12/2014 Imaging MRI of the liver show mild regression of the size of lesions, presumed to be benign   09/15/2014 Imaging Repeat MRI of the brain show persistent abnormalities, suspicious for residual disease   03/02/2015 Imaging  MRI brain at [facility] show persistent abnormalities   08/10/2015 Imaging Repeat MRI [facility] showed stable disease   08/24/2015 -  Chemotherapy She was started on Lomustine, Procarbazine and Vincristine chemotherapy   08/30/2015 Imaging CT chest showed no evidence of aspiration pneumonia   11/18/2015 Imaging MRI brain showed status post resection of right frontal lobe tumor without residual or recurrent  tumor. 2. Otherwise negative MRI of the brain.     INTERVAL HISTORY: Please see below for problem oriented charting. She returns today for further chemotherapy. Over the weekend, she was taking for her cousin's wedding and actually has recurrent episodes of seizures. According to her son, showed a mixture of absence seizures mixed with simple partial seizures. She has pulse oximetry monitoring at home with abnormal vital signs and she was tachycardic intermittently with heart rate as high as 160 with O2 desaturation down to the 80s for over one hour. She was ever brought into the emergency department for further evaluation and has seizures subsequently subsided. The patient has poor recollection of what happened. She denies focal neurological deficits since then. She denies peripheral neuropathy. Denies severe nausea vomiting recently from chemotherapy.  REVIEW OF SYSTEMS:   Constitutional: Denies fevers, chills or abnormal weight loss Eyes: Denies blurriness of vision Ears, nose, mouth, throat, and face: Denies mucositis or sore throat Gastrointestinal:  Denies nausea, heartburn or change in bowel habits Skin: Denies abnormal skin rashes Lymphatics: Denies new lymphadenopathy or easy bruising Behavioral/Psych: Mood is stable, no new changes  All other systems were reviewed with the patient and are negative.  I have reviewed the past medical history, past surgical history, social history and family history with the patient and they are unchanged from previous note.  ALLERGIES:  is allergic to adhesive; latex; percocet; shellfish allergy; skin adhesives; soma; tramadol hcl; and vancomycin.  MEDICATIONS:  Current Outpatient Prescriptions  Medication Sig Dispense Refill  . albuterol (PROVENTIL HFA;VENTOLIN HFA) 108 (90 BASE) MCG/ACT inhaler Inhale 2 puffs into the lungs every 6 (six) hours as needed for wheezing or shortness of breath. Reported on  08/11/2015    .  butalbital-acetaminophen-caffeine (FIORICET, ESGIC) 50-325-40 MG tablet TAKE 1 TABLET BY MOUTH EVERY 6 HOURS AS NEEDED FOR HEADACE. 90 tablet 1  . carisoprodol (SOMA) 350 MG tablet Take 1 tablet (350 mg total) by mouth 4 (four) times daily. For seizure prevention 100 tablet 0  . cetirizine (ZYRTEC) 10 MG tablet Take 10 mg by mouth daily.    . divalproex (DEPAKOTE ER) 250 MG 24 hr tablet TAKE 2 TABLETS BY MOUTH IN THE MORNING AND 3 TABLETS EVERY EVENING 450 tablet 1  . gabapentin (NEURONTIN) 300 MG capsule TAKE 3 CAPSULES BY MOUTH IN THE MORNING AND 3 CAPSULES IN THE EVENING 540 capsule 1  . GLEOSTINE 100 MG capsule TAKE 2 CAPSULES BY MOUTH ONCE. TAKE ON AN EMTPY STOMACH 1 HOUR BEFORE OR 2 HOURS AFTER MEALS. CAUTION:CHEMOTHERAPY 2 capsule 0  . GLEOSTINE 40 MG capsule TAKE 1 CAPSULE BY MOUTH ONCE. TAKE ON AN EMPTY STOMACH 1 HOUR BEFORE OR 2 HOURS AFTER A MEAL. CAUTION:CHEMOTHERAPY 1 capsule 0  . HYDROcodone-acetaminophen (NORCO) 10-325 MG tablet Take 1 tablet by mouth every 6 (six) hours as needed. 90 tablet 0  . lidocaine-prilocaine (EMLA) cream Apply 1 application topically as needed. 30 g 6  . LORazepam (ATIVAN) 0.5 MG tablet Take 1 tablet (0.5 mg total) by mouth every 8 (eight) hours as needed for anxiety or seizure. 90 tablet 2  . morphine (MSIR) 15 MG tablet Take 1 tablet (15 mg total) by mouth every 6 (six) hours as needed for severe pain. 90 tablet 0  . ondansetron (ZOFRAN ODT) 8 MG disintegrating tablet Take 1 tablet (8 mg total) by mouth every 8 (eight) hours as needed for nausea or vomiting. 90 tablet 3  . ondansetron (ZOFRAN) 8 MG tablet Take 1 tablet (8 mg total) by mouth every 8 (eight) hours as needed for nausea. 90 tablet 3  . procarbazine (MATULANE) 50 MG capsule Take 2 capsules (100 mg total) by mouth daily. Follow low tyramine diet. Take for 14 days starting on Day 8. Repeat every 6 weeks 28 capsule 0  . promethazine (PHENERGAN) 25 MG tablet Take 25 mg by mouth every 6 (six) hours as  needed for nausea or vomiting. Reported on 09/21/2015    . dicyclomine (BENTYL) 20 MG tablet Take 20 mg by mouth at bedtime as needed and may repeat dose one time if needed for spasms. Reported on 01/04/2016     No current facility-administered medications for this visit.    PHYSICAL EXAMINATION: ECOG PERFORMANCE STATUS: 0 - Asymptomatic  Filed Vitals:   01/04/16 1400  BP: 111/65  Pulse: 67  Temp: 97.9 F (36.6 C)  Resp: 18   Filed Weights   01/04/16 1400  Weight: 159 lb 9.6 oz (72.394 kg)    GENERAL:alert, no distress and comfortable SKIN: skin color, texture, turgor are normal, no rashes or significant lesions EYES: normal, Conjunctiva are pink and non-injected, sclera clear Musculoskeletal:no cyanosis of digits and no clubbing  NEURO: alert & oriented x 3 with fluent speech, no focal motor/sensory deficits  LABORATORY DATA:  I have reviewed the data as listed    Component Value Date/Time   NA 141 01/04/2016 1341   NA 141 10/02/2015 1215   K 4.6 01/04/2016 1341   K 3.9 10/02/2015 1215   CL 105 10/02/2015 1215   CO2 27 01/04/2016 1341   CO2 27 10/02/2015 1215   GLUCOSE 84 01/04/2016 1341   GLUCOSE 84 10/02/2015 1215   BUN 6.8* 01/04/2016  1341   BUN 9 10/02/2015 1215   CREATININE 0.8 01/04/2016 1341   CREATININE 0.60 10/02/2015 1215   CALCIUM 9.0 01/04/2016 1341   CALCIUM 9.3 10/02/2015 1215   PROT 6.8 01/04/2016 1341   PROT 6.7 08/30/2015 0105   ALBUMIN 4.0 01/04/2016 1341   ALBUMIN 4.3 08/30/2015 0105   AST 32 01/04/2016 1341   AST 22 08/30/2015 0105   ALT 35 01/04/2016 1341   ALT 30 08/30/2015 0105   ALKPHOS 37* 01/04/2016 1341   ALKPHOS 38 08/30/2015 0105   BILITOT 0.37 01/04/2016 1341   BILITOT 0.1* 08/30/2015 0105   GFRNONAA >60 10/02/2015 1215   GFRAA >60 10/02/2015 1215    No results found for: SPEP, UPEP  Lab Results  Component Value Date   WBC 2.9* 01/04/2016   NEUTROABS 1.8 01/04/2016   HGB 11.3* 01/04/2016   HCT 33.3* 01/04/2016   MCV  101.3* 01/04/2016   PLT 96* 01/04/2016      Chemistry      Component Value Date/Time   NA 141 01/04/2016 1341   NA 141 10/02/2015 1215   K 4.6 01/04/2016 1341   K 3.9 10/02/2015 1215   CL 105 10/02/2015 1215   CO2 27 01/04/2016 1341   CO2 27 10/02/2015 1215   BUN 6.8* 01/04/2016 1341   BUN 9 10/02/2015 1215   CREATININE 0.8 01/04/2016 1341   CREATININE 0.60 10/02/2015 1215      Component Value Date/Time   CALCIUM 9.0 01/04/2016 1341   CALCIUM 9.3 10/02/2015 1215   ALKPHOS 37* 01/04/2016 1341   ALKPHOS 38 08/30/2015 0105   AST 32 01/04/2016 1341   AST 22 08/30/2015 0105   ALT 35 01/04/2016 1341   ALT 30 08/30/2015 0105   BILITOT 0.37 01/04/2016 1341   BILITOT 0.1* 08/30/2015 0105      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 I am concerned about side effects from treatment. The MRI is reported with no residual disease although persistent abnormalities are noted on the MRI. Plan would be to do 2 more cycles of treatment and repeat MRI in August before she proceed with potential subtotal resection in the near future.  Due to neuropathy and uncontrolled nausea along with mild pancytopenia, she will only receive vincristine the 50% dose adjustment along with other chemotherapy dose adjustment.  Pancytopenia due to antineoplastic chemotherapy [facility]) This is likely due to recent treatment. The patient denies recent history of bleeding such as epistaxis, hematuria or hematochezia. No recent infection. She is asymptomatic from the low platelet count and low white blood cell count. I will observe for now.  she does not require transfusion now. I will continue the chemotherapy at current dose with dose adjustment.  For the next cycle of treatment, we will delay treatment by one week to accommodate for some personal time off with family and to  allow pancytopenia to recover better.   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 The last episode of seizure was precipitated by stress from a cousin's wedding. The patient has a personal pulse oximetry which documented persistent hypoxemia during the seizure activity with oxygen saturation less than 90% for over one hour. I recommend the family members to bring her to the emergency department should this happen again to reduce long-term brain damage from hypoxemia  Asthma The patient has diagnosis of asthma.  She had recent acute asthma exacerbation and was taking a lot of albuterol frequently With albuterol, she has significant tachycardia. I am concerned about this. It is not clear whether she may have side effects from chemotherapy. Full pulmonary function test is scheduled and will see her back to review test results.   No orders of the defined types were placed in this encounter.   All questions were answered. The patient knows to call the clinic with any problems, questions or concerns. No barriers to learning was detected. I spent 25 minutes counseling the patient face to face. The total time spent in the appointment was 30 minutes and more than 50% was on counseling and review of test results     [facility], [place], MD 01/05/2016 8:28 AM

## 2016-01-06 ENCOUNTER — Ambulatory Visit: Payer: Medicaid Other

## 2016-01-06 ENCOUNTER — Ambulatory Visit: Payer: Medicaid Other | Admitting: Hematology and Oncology

## 2016-01-06 ENCOUNTER — Other Ambulatory Visit: Payer: Medicaid Other

## 2016-01-08 MED FILL — LORazepam 0.5 MG TABS: 0.5 | 30 days supply | Qty: 90 | Fill #1

## 2016-01-11 ENCOUNTER — Telehealth: Payer: Self-pay | Admitting: *Deleted

## 2016-01-11 ENCOUNTER — Ambulatory Visit (HOSPITAL_COMMUNITY)
Admission: RE | Admit: 2016-01-11 | Discharge: 2016-01-11 | Disposition: A | Discharge status: Home / Self Care | Payer: Medicaid Other | Source: Ambulatory Visit | Attending: Hematology and Oncology | Admitting: Hematology and Oncology

## 2016-01-11 DIAGNOSIS — J45909 Unspecified asthma, uncomplicated: Secondary | ICD-10-CM | POA: Diagnosis present

## 2016-01-11 LAB — PULMONARY FUNCTION TEST
DL/VA % pred: 77 %
DL/VA: 4.13 ml/min/mmHg/L
DLCO unc % pred: 59 %
DLCO unc: 18.51 ml/min/mmHg
FEF 25-75 Pre: 3.07 L/sec
FEF2575-%Pred-Pre: 84 %
FEV1-%Pred-Pre: 79 %
FEV1-Pre: 2.89 L
FEV1FVC-%Pred-Pre: 99 %
FEV6-%Pred-Pre: 79 %
FEV6-Pre: 3.45 L
FEV6FVC-%Pred-Pre: 101 %
FVC-%Pred-Pre: 78 %
FVC-Pre: 3.45 L
Pre FEV1/FVC ratio: 84 %
Pre FEV6/FVC Ratio: 100 %
RV % pred: 124 %
RV: 2.14 L
TLC % pred: 87 %
TLC: 5.06 L

## 2016-01-11 NOTE — Telephone Encounter (Signed)
Pt called about getting her refill on Soma.  States pharmacy says it still requires P.A. To be done and they can't send it thru to her insurance until 6/29.  Pt says she will be out of Soma by 6/29.  Informed pt that P.A. For Soma needs to be completed by Dr. Cyndy Freeze.  He knows why Victoria Bates is prescribed and he can answer the PA questions. Pt verbalized understanding.   I called [facility] and s/w Adonis Brook.  Informed her of PA to go to Dr. Cyndy Freeze he is the originating prescriber.  Adonis Brook shares that pt insistent on having a certain brand of Soma filled as the other brands she had caused her some "neurosis."   Adonis Brook is not sure Medicaid will approve this but understands PA to be directed to Dr. Hewitt Shorts office.

## 2016-01-11 NOTE — Telephone Encounter (Signed)
Pt lvm for Scheduler states needs to R/S appts as discussed w/ Dr. Alvy Bimler.  Pt says she talked to Dr. Alvy Bimler about delaying her treatment by one week. Pt says she will not start her next cycle chemo pills until 7/17 and then should be seeing Dr. Alvy Bimler on 7/24 with IV chemo.

## 2016-01-11 NOTE — Telephone Encounter (Signed)
Pt concerned about Methacoline challenge PFT tomorrow.  She says Tech told her they "incite asthma attack" and then give Albuterol.   Pt says she cannot tolerate Albuterol due to side effects of elevated HR and BP.   Discussed w/ Dr. Alvy Bimler and she says ok to cancel exam.  I called pt back and left her a VM informing her of test canceled.

## 2016-01-11 NOTE — Telephone Encounter (Signed)
Pt asks if Dr. Alvy Bimler will prescribe Xopenex inhaler for her to use instead of Albuterol since Albuterol causes her to have high BP and elevated HR?  She would like it filled at [facility] if Dr. Alvy Bimler agrees.

## 2016-01-12 ENCOUNTER — Other Ambulatory Visit: Payer: Self-pay | Admitting: *Deleted

## 2016-01-12 ENCOUNTER — Telehealth: Payer: Self-pay | Admitting: *Deleted

## 2016-01-12 ENCOUNTER — Encounter (HOSPITAL_COMMUNITY): Payer: Medicaid Other

## 2016-01-12 DIAGNOSIS — J45909 Unspecified asthma, uncomplicated: Secondary | ICD-10-CM

## 2016-01-12 MED FILL — CARISOPRODOL 350 MG TABLET: 350 | 23 days supply | Qty: 90 | Fill #0

## 2016-01-12 NOTE — Telephone Encounter (Signed)
It's a long acting beta agonist. Will still cause tachycardia Would she be willing to be referred to pulmonary? They can recommend better Rx If she agrees, please send a referral;

## 2016-01-12 NOTE — Telephone Encounter (Signed)
That is OK. Please send new POF I can see her around 12 pm that day on 7/24

## 2016-01-12 NOTE — Telephone Encounter (Signed)
Informed Pt of following;  1.PA on Soma request sent to Dr. Hewitt Shorts office from [facility]. 2. Ok to r/s her appts on 7/17 to 7/24.  Order has been sent to Scheduling.  She should be getting a call from Scheduler to r/s. 3. Dr. Alvy Bimler does not recommend ordering Xopenex in place of Albuterol.  Thinks pt may have similar side effects.  She recommends referral to Pulmonary.  Pt agrees.  Informed her we will send Referral to [facility].  She agrees and verbalized understanding of above.

## 2016-01-13 ENCOUNTER — Telehealth: Payer: Self-pay | Admitting: Hematology and Oncology

## 2016-01-13 NOTE — Telephone Encounter (Signed)
lvm for pt confirming 7.17 moved to 7.24.Marland KitchenMarland KitchenMarland Kitchen

## 2016-01-14 ENCOUNTER — Other Ambulatory Visit: Payer: Self-pay | Admitting: *Deleted

## 2016-01-14 MED ORDER — BUTALBITAL-APAP-CAFFEINE 50-325-40 MG PO TABS: ORAL_TABLET | ORAL | Status: DC

## 2016-01-14 MED ORDER — HYDROCODONE-ACETAMINOPHEN 10-325 MG PO TABS: 1.0000 | ORAL_TABLET | Freq: Four times a day (QID) | ORAL | Status: DC | PRN

## 2016-01-14 MED FILL — BUTALB-ACETAMIN-CAFF 50-325: 50-325-40 | 22 days supply | Qty: 90 | Fill #0

## 2016-01-14 MED FILL — HYDROCODON-APAP 10-325: 10-325 | 22 days supply | Qty: 90 | Fill #0

## 2016-01-14 NOTE — Telephone Encounter (Signed)
Pt reports Soma approved.  She also got her new schedule for 7/24.  She needs refill on Hydrocodone and Fioricet.  She is hoping to go out of town this weekend.  Informed pt I will get Rxs ready for her husband to pick up at our office.  She verbalized understanding.

## 2016-01-26 ENCOUNTER — Telehealth: Payer: Self-pay | Admitting: *Deleted

## 2016-01-26 ENCOUNTER — Other Ambulatory Visit: Payer: Self-pay | Admitting: Hematology and Oncology

## 2016-01-26 DIAGNOSIS — C711 Malignant neoplasm of frontal lobe: Secondary | ICD-10-CM

## 2016-01-26 MED ORDER — MORPHINE SULFATE 15 MG PO TABS: 15.0000 mg | ORAL_TABLET | Freq: Four times a day (QID) | ORAL | Status: DC | PRN

## 2016-01-26 MED FILL — GLEOSTINE 40 MG CAPSULE: 40 | 1 days supply | Qty: 1 | Fill #0

## 2016-01-26 MED FILL — MORPHINE SULFATE IR 15 MG T: 15 | 22 days supply | Qty: 90 | Fill #0

## 2016-01-26 MED FILL — GLEOSTINE 100 MG CAPSULE: 100 | 1 days supply | Qty: 2 | Fill #0

## 2016-01-26 NOTE — Telephone Encounter (Signed)
Pt left VM requesting refill on MS IR.  She says her husband will pick it up.  Called [name] and informed him of Rx ready to pick up at our office.

## 2016-01-28 ENCOUNTER — Encounter: Payer: Self-pay | Admitting: Internal Medicine

## 2016-01-28 ENCOUNTER — Ambulatory Visit (INDEPENDENT_AMBULATORY_CARE_PROVIDER_SITE_OTHER): Payer: Medicaid Other | Admitting: Internal Medicine

## 2016-01-28 ENCOUNTER — Ambulatory Visit (INDEPENDENT_AMBULATORY_CARE_PROVIDER_SITE_OTHER)
Admission: RE | Admit: 2016-01-28 | Discharge: 2016-01-28 | Disposition: A | Discharge status: Home / Self Care | Payer: Medicaid Other | Source: Ambulatory Visit | Attending: Internal Medicine | Admitting: Internal Medicine

## 2016-01-28 VITALS — BP 124/70 | HR 105 | Ht 69.0 in | Wt 162.0 lb

## 2016-01-28 DIAGNOSIS — R0600 Dyspnea, unspecified: Secondary | ICD-10-CM | POA: Diagnosis not present

## 2016-01-28 DIAGNOSIS — J45991 Cough variant asthma: Secondary | ICD-10-CM | POA: Diagnosis not present

## 2016-01-28 DIAGNOSIS — R058 Other specified cough: Secondary | ICD-10-CM | POA: Insufficient documentation

## 2016-01-28 DIAGNOSIS — R05 Cough: Secondary | ICD-10-CM

## 2016-01-28 LAB — NITRIC OXIDE: Nitric Oxide: 14

## 2016-01-28 MED ORDER — PANTOPRAZOLE SODIUM 40 MG PO TBEC: 40.0000 mg | DELAYED_RELEASE_TABLET | Freq: Every day | ORAL | Status: DC

## 2016-01-28 MED ORDER — FAMOTIDINE 20 MG PO TABS: ORAL_TABLET | ORAL | Status: DC

## 2016-01-28 MED FILL — PANTOPRAZOLE SOD DR 40 MG T: 40 | 30 days supply | Qty: 30 | Fill #0

## 2016-01-28 MED FILL — FAMOTIDINE 20 MG TABLET: 20 | 30 days supply | Qty: 30 | Fill #0

## 2016-01-28 NOTE — Patient Instructions (Addendum)
No evidence of CCNU toxicity, not sure about the vincristine and next step in the work up may be to repeat the upper gi series to watch the barium go all the way down to your stomach   Only use your albuterol as a rescue medication to be used if you can't catch your breath by resting or doing a relaxed purse lip breathing pattern.  - The less you use it, the better it will work when you need it. - Ok to use up to 1 puffs  every 4 hours if you must but call for immediate appointment if use goes up over your usual need - Don't leave home without it !!  (think of it like the spare tire for your car)    Pantoprazole (protonix) 40 mg   Take  30-60 min before first meal of the day and Pepcid (famotidine)  20 mg one @  bedtime until return to office - this is the best way to tell whether stomach acid is contributing to your problem.    GERD (REFLUX)  is an extremely common cause of respiratory symptoms just like yours , many times with no obvious heartburn at all.    It can be treated with medication, but also with lifestyle changes including elevation of the head of your bed (ideally with 6 inch  bed blocks),  Smoking cessation, avoidance of late meals, excessive alcohol, and avoid fatty foods, chocolate, peppermint, colas, red wine, and acidic juices such as orange juice.  NO MINT OR MENTHOL PRODUCTS SO NO COUGH DROPS  USE SUGARLESS CANDY INSTEAD (Jolley ranchers or Stover's or Life Savers) or even ice chips will also do - the key is to swallow to prevent all throat clearing. NO OIL BASED VITAMINS - use powdered substitutes.    Please remember to go to the   x-ray department downstairs for your tests - we will call you with the results when they are available.  Please schedule a follow up office visit in 4-6  weeks, sooner if needed

## 2016-01-28 NOTE — Progress Notes (Signed)
Subjective:    Patient ID: Victoria Bates, female    DOB: 03-31-1981,    MRN: [id number]  HPI  64 yowf never smoker asthma as child mostly just symptomatic with sports/ pregnancy and and since 2008 avg sev times a month saba  at most but since early Feb  2017 (started CCNU around first of Feb 2017) onset sob some better with albuterol but marked increased in sob / cough with one ER eval for refractory symptoms and much better took last chemo June 19th and last inhaler July 1st but planning restart July 17 so referred to pulmonary clinic 01/28/2016 by Dr Alvy Bimler ? Having med reaction    01/28/2016 1st [facility] office visit/ Victoria Bates   Chief Complaint  Patient presents with  . Pulmonary Consult    Referred by Dr. Alvy Bimler.  Pt states that she has had Asthma as long as she can remember. She had not had problems until April 2017. She c/o cough, SOB and wheezing. Symptoms are worse when she is exposed to heat.   previously nights were rough, now better off chemo / worse out in heat with severe cough/sob sometimes better with saba and sometimes not assoc with variable dysphagia related to brain tumor and on restricted diet with last ST eval 05/06/15 "has it under control  No obvious other patterns in day to day or daytime variability or assoc excess/ purulent sputum or mucus plugs or hemoptysis or cp or chest tightness, subjective wheeze or overt sinus or hb symptoms. No unusual exp hx or h/o childhood pna/ or knowledge of premature birth.  Sleeping ok without nocturnal  or early am exacerbation  of respiratory  c/o's or need for noct saba. Also denies any obvious fluctuation of symptoms with weather or environmental changes or other aggravating or alleviating factors except as outlined above   Current Medications, Allergies, Complete Past Medical History, Past Surgical History, Family History, and Social History were reviewed in [employer] record.              Review of Systems  Constitutional: Negative for fever, chills and unexpected weight change.  HENT: Positive for trouble swallowing. Negative for congestion, dental problem, ear pain, nosebleeds, postnasal drip, rhinorrhea, sinus pressure, sneezing, sore throat and voice change.   Eyes: Negative for visual disturbance.  Respiratory: Positive for cough. Negative for choking and shortness of breath.   Cardiovascular: Negative for chest pain and leg swelling.  Gastrointestinal: Negative for vomiting, abdominal pain and diarrhea.  Genitourinary: Negative for difficulty urinating.  Musculoskeletal: Negative for arthralgias.  Skin: Negative for rash.  Neurological: Negative for tremors, syncope and headaches.  Hematological: Does not bruise/bleed easily.       Objective:   Physical Exam  Pleasant amb wf harsh upper pattern cough/ last saba January 16 2016   Wt Readings from Last 3 Encounters:  01/28/16 162 lb (73.483 kg)  01/04/16 159 lb 9.6 oz (72.394 kg)  12/16/15 160 lb 11.2 oz (72.893 kg)    Vital signs reviewed    HEENT: nl dentition, turbinates, and oropharynx. Nl external ear canals without cough reflex   NECK :  without JVD/Nodes/TM/ nl carotid upstrokes bilaterally   LUNGS: no acc muscle use,  Nl contour chest which is clear to A and P bilaterally with  cough on insp    CV:  RRR  no s3 or murmur or increase in P2, no edema   ABD:  soft and nontender with nl inspiratory excursion in  the supine position. No bruits or organomegaly, bowel sounds nl  MS:  Nl gait/ ext warm without deformities, calf tenderness, cyanosis or clubbing No obvious joint restrictions   SKIN: warm and dry without lesions    NEURO:  alert, approp, nl sensorium with  no motor deficits     CXR PA and Lateral:   01/28/2016 :    I personally reviewed images and agree with radiology impression as follows:    No active cardiopulmonary disease.    CTa Chest 08/30/15 No evidence of pulmonary  embolus. Lungs clear bilaterally     Assessment & Plan:

## 2016-01-29 ENCOUNTER — Encounter: Payer: Self-pay | Admitting: Internal Medicine

## 2016-01-29 ENCOUNTER — Telehealth: Payer: Self-pay | Admitting: Internal Medicine

## 2016-01-29 MED ORDER — LEVALBUTEROL TARTRATE 45 MCG/ACT IN AERO: 1.0000 | INHALATION_SPRAY | RESPIRATORY_TRACT | Status: DC | PRN

## 2016-01-29 NOTE — Assessment & Plan Note (Addendum)
-   FENO 01/28/2016  =   14 01/28/2016  After extensive coaching HFA effectiveness =    75% vs baseline of 50%  i doubt there is much primary airways dz here based on hx and low feno and she tells me the albuterol causes tachycardia > if learned optimal (100%) hfa she could get the same benefit from half the dose so rec work on hfa/ only use the alb one q 4h prn  Rule of 2's reviewed

## 2016-01-29 NOTE — Assessment & Plan Note (Addendum)
PFT 01/11/16 nl flow/ vol with dlco 59 corrects to 77% - FENO 01/28/2016  =   14 - 01/28/2016  Walked RA x 3 laps @ 185 ft each stopped due to  End of study, fast  pace, no sob or desat   But some chest tightness/ coughing    Symptoms are markedly disproportionate to objective findings and not clear this is a lung problem but pt does appear to have difficult airway management issues. DDX of  difficult airways management almost all start with A and  include Adherence, Ace Inhibitors, Acid Reflux, Active Sinus Disease, Alpha 1 Antitripsin deficiency, Anxiety masquerading as Airways dz,  ABPA,  Allergy(esp in young), Aspiration (esp in elderly), Adverse effects of meds,  Active smokers, A bunch of PE's (a small clot burden can't cause this syndrome unless there is already severe underlying pulm or vascular dz with poor reserve) plus two Bs  = Bronchiectasis and Beta blocker use..and one C= CHF   Adherence is always the initial "prime suspect" and is a multilayered concern that requires a "trust but verify" approach in every patient - starting with knowing how to use medications, especially inhalers, correctly, keeping up with refills and understanding the fundamental difference between maintenance and prns vs those medications only taken for a very short course and then stopped and not refilled.  - see asthma for mdi use  ? Acid (or non-acid) GERD > always difficult to exclude as up to 75% of pts in some series report no assoc GI/ Heartburn symptoms> rec max (24h)  acid suppression and diet restrictions/ reviewed and instructions given in writing.   ? Aspiration ? Related to vincristine effects on swallowing which is at baseline abnormal? Needs repeat UGI ?  ? Anxiety > usually at the bottom of this list of usual suspects but should be much higher on this pt's based on H and P   ? Adverse drug effects>  CCNU is assoc with late onset PF just like BCNU but I could not find any mention of airway involvement  in uptodate and the pfts / ex sats look ok so reasonable to continue but would monitor repeat pfts in 3 m if this drug is still needed on a risk/benefit analysis by Dr Darnell Level  ? A bunch of PE's > note neg CTa at onset and variable pattern rule strongly against  ? Allergy /asthma > very low feno rules against > see cough variant asthma    Total time devoted to counseling  = 35/26m review case with pt/ discussion of options/alternatives/ personally creating written instructions  in presence of pt  then going over those specific  Instructions directly with the pt including how to use all of the meds but in particular covering each new medication in detail and the difference between the maintenance/automatic meds and the prns using an action plan format for the latter.

## 2016-01-29 NOTE — Assessment & Plan Note (Signed)
Classic Upper airway cough syndrome, so named because it's frequently impossible to sort out how much is  CR/sinusitis with freq throat clearing (which can be related to primary GERD)   vs  causing  secondary (" extra esophageal")  GERD from wide swings in gastric pressure that occur with throat clearing, often  promoting self use of mint and menthol lozenges that reduce the lower esophageal sphincter tone and exacerbate the problem further in a cyclical fashion.   These are the same pts (now being labeled as having "irritable larynx syndrome" by some cough centers) who not infrequently have a history of having failed to tolerate ace inhibitors,  dry powder inhalers or biphosphonates or report having atypical reflux symptoms that don't respond to standard doses of PPI , and are easily confused as having aecopd or asthma flares by even experienced allergists/ pulmonologists.   rec max rx for gerd/ f/u in 6 weeks

## 2016-01-29 NOTE — Telephone Encounter (Signed)
Result Notes     Notes Recorded by Len Blalock, CMA on 01/29/2016 at 10:52 AM lmtcb X1 for pt to relay results/recs. ------ Notes Recorded by Tanda Rockers, MD on 01/29/2016 at 5:14 AM Call pt: Reviewed cxr and no acute change so no change in recommendations made at [facility] with pt. She is aware of results. She also wanted to know if we could send in a prescription for Xopenex HFA. States that she spoke with MW about this but we didn't send in a prescription.  MW - please advise. Thanks.

## 2016-01-29 NOTE — Telephone Encounter (Signed)
Ok with me but need to warn her insurance may not cover and what I said was just use alb 1 q4h with optimal hfa technique and should work just as well as 2 of the xopenex if can't afford the xopenex

## 2016-01-29 NOTE — Telephone Encounter (Signed)
Called and spoke with pt and she is aware that xopenex hfa has been sent to the pharmacy and that her insurance may not cover this.  She stated that her oncologist wanted her on med that would not raise her BP or HR.  Pt is aware that the pharmacy will contact us for any issues with this medication.

## 2016-02-01 ENCOUNTER — Ambulatory Visit: Payer: Medicaid Other | Admitting: Hematology and Oncology

## 2016-02-01 ENCOUNTER — Other Ambulatory Visit: Payer: Medicaid Other

## 2016-02-01 ENCOUNTER — Ambulatory Visit: Payer: Medicaid Other

## 2016-02-01 MED FILL — CARISOPRODOL 350 MG TABLET: 350 | 23 days supply | Qty: 90 | Fill #1

## 2016-02-04 ENCOUNTER — Other Ambulatory Visit: Payer: Self-pay | Admitting: *Deleted

## 2016-02-04 MED ORDER — PROCARBAZINE HCL 50 MG PO CAPS: 100.0000 mg | ORAL_CAPSULE | Freq: Every day | ORAL | Status: DC

## 2016-02-08 ENCOUNTER — Encounter: Payer: Self-pay | Admitting: Hematology and Oncology

## 2016-02-08 ENCOUNTER — Telehealth: Payer: Self-pay | Admitting: Hematology and Oncology

## 2016-02-08 ENCOUNTER — Ambulatory Visit: Payer: Medicaid Other

## 2016-02-08 ENCOUNTER — Ambulatory Visit (HOSPITAL_BASED_OUTPATIENT_CLINIC_OR_DEPARTMENT_OTHER): Payer: Medicaid Other | Admitting: Hematology and Oncology

## 2016-02-08 ENCOUNTER — Other Ambulatory Visit (HOSPITAL_BASED_OUTPATIENT_CLINIC_OR_DEPARTMENT_OTHER): Payer: Medicaid Other

## 2016-02-08 ENCOUNTER — Ambulatory Visit (HOSPITAL_BASED_OUTPATIENT_CLINIC_OR_DEPARTMENT_OTHER): Payer: Medicaid Other

## 2016-02-08 VITALS — BP 110/61 | HR 105 | Temp 98.9°F | Resp 18 | Ht 69.0 in | Wt 160.2 lb

## 2016-02-08 VITALS — BP 119/87 | HR 93 | Resp 16

## 2016-02-08 DIAGNOSIS — Z95828 Presence of other vascular implants and grafts: Secondary | ICD-10-CM

## 2016-02-08 DIAGNOSIS — D61810 Antineoplastic chemotherapy induced pancytopenia: Secondary | ICD-10-CM | POA: Diagnosis not present

## 2016-02-08 DIAGNOSIS — E860 Dehydration: Secondary | ICD-10-CM | POA: Diagnosis not present

## 2016-02-08 DIAGNOSIS — J45909 Unspecified asthma, uncomplicated: Secondary | ICD-10-CM

## 2016-02-08 DIAGNOSIS — C711 Malignant neoplasm of frontal lobe: Secondary | ICD-10-CM

## 2016-02-08 DIAGNOSIS — R112 Nausea with vomiting, unspecified: Secondary | ICD-10-CM | POA: Diagnosis not present

## 2016-02-08 DIAGNOSIS — M792 Neuralgia and neuritis, unspecified: Secondary | ICD-10-CM

## 2016-02-08 DIAGNOSIS — G44021 Chronic cluster headache, intractable: Secondary | ICD-10-CM | POA: Diagnosis not present

## 2016-02-08 DIAGNOSIS — T451X5A Adverse effect of antineoplastic and immunosuppressive drugs, initial encounter: Secondary | ICD-10-CM

## 2016-02-08 DIAGNOSIS — G40109 Localization-related (focal) (partial) symptomatic epilepsy and epileptic syndromes with simple partial seizures, not intractable, without status epilepticus: Secondary | ICD-10-CM

## 2016-02-08 DIAGNOSIS — Z5111 Encounter for antineoplastic chemotherapy: Secondary | ICD-10-CM | POA: Diagnosis not present

## 2016-02-08 DIAGNOSIS — R0600 Dyspnea, unspecified: Secondary | ICD-10-CM

## 2016-02-08 LAB — COMPREHENSIVE METABOLIC PANEL
ALT: 17 U/L (ref 0–55)
AST: 19 U/L (ref 5–34)
Albumin: 4 g/dL (ref 3.5–5.0)
Alkaline Phosphatase: 34 U/L — ABNORMAL LOW (ref 40–150)
Anion Gap: 8 mEq/L (ref 3–11)
BUN: 12.5 mg/dL (ref 7.0–26.0)
CO2: 24 mEq/L (ref 22–29)
Calcium: 9 mg/dL (ref 8.4–10.4)
Chloride: 107 mEq/L (ref 98–109)
Creatinine: 0.7 mg/dL (ref 0.6–1.1)
EGFR: >90 mL/min/{1.73_m2} (ref >90)
Glucose: 97 mg/dl (ref 70–140)
Potassium: 4.4 mEq/L (ref 3.5–5.1)
Sodium: 139 mEq/L (ref 136–145)
Total Bilirubin: 0.55 mg/dL (ref 0.20–1.20)
Total Protein: 6.9 g/dL (ref 6.4–8.3)

## 2016-02-08 LAB — CBC WITH DIFFERENTIAL/PLATELET
BASO%: 0.4 % (ref 0.0–2.0)
Basophils Absolute: 0 10*3/uL (ref 0.0–0.1)
EOS%: 1.3 % (ref 0.0–7.0)
Eosinophils Absolute: 0 10*3/uL (ref 0.0–0.5)
HCT: 29.7 % — ABNORMAL LOW (ref 34.8–46.6)
HGB: 10.7 g/dL — ABNORMAL LOW (ref 11.6–15.9)
LYMPH%: 24.6 % (ref 14.0–49.7)
MCH: 35 pg — ABNORMAL HIGH (ref 25.1–34.0)
MCHC: 36 g/dL (ref 31.5–36.0)
MCV: 97.1 fL (ref 79.5–101.0)
MONO#: 0.2 10*3/uL (ref 0.1–0.9)
MONO%: 6.4 % (ref 0.0–14.0)
NEUT#: 1.6 10*3/uL (ref 1.5–6.5)
NEUT%: 67.3 % (ref 38.4–76.8)
Platelets: 117 10*3/uL — ABNORMAL LOW (ref 145–400)
RBC: 3.06 10*6/uL — ABNORMAL LOW (ref 3.70–5.45)
RDW: 13.1 % (ref 11.2–14.5)
WBC: 2.4 10*3/uL — ABNORMAL LOW (ref 3.9–10.3)
lymph#: 0.6 10*3/uL — ABNORMAL LOW (ref 0.9–3.3)

## 2016-02-08 MED ORDER — PALONOSETRON HCL INJECTION 0.25 MG/5ML
INTRAVENOUS | Status: AC
  Filled 2016-02-08: qty 5

## 2016-02-08 MED ORDER — SODIUM CHLORIDE 0.9 % IV SOLN
INTRAVENOUS | Status: DC
  Administered 2016-02-08: 13:00:00 via INTRAVENOUS

## 2016-02-08 MED ORDER — ONDANSETRON 8 MG PO TBDP: 8.0000 mg | ORAL_TABLET | Freq: Three times a day (TID) | ORAL | 3 refills | Status: DC | PRN

## 2016-02-08 MED ORDER — HEPARIN SOD (PORK) LOCK FLUSH 100 UNIT/ML IV SOLN
500.0000 [IU] | Freq: Once | INTRAVENOUS | Status: AC | PRN
  Administered 2016-02-08: 500 [IU]
  Filled 2016-02-08: qty 5

## 2016-02-08 MED ORDER — SODIUM CHLORIDE 0.9% FLUSH
10.0000 mL | INTRAVENOUS | Status: DC | PRN
  Administered 2016-02-08: 10 mL via INTRAVENOUS
  Filled 2016-02-08: qty 10

## 2016-02-08 MED ORDER — HYDROCODONE-ACETAMINOPHEN 10-325 MG PO TABS: 1.0000 | ORAL_TABLET | Freq: Four times a day (QID) | ORAL | 0 refills | Status: DC | PRN

## 2016-02-08 MED ORDER — PALONOSETRON HCL INJECTION 0.25 MG/5ML
0.2500 mg | Freq: Once | INTRAVENOUS | Status: AC
  Administered 2016-02-08: 0.25 mg via INTRAVENOUS

## 2016-02-08 MED ORDER — SODIUM CHLORIDE 0.9% FLUSH
10.0000 mL | INTRAVENOUS | Status: DC | PRN
  Administered 2016-02-08: 10 mL
  Filled 2016-02-08: qty 10

## 2016-02-08 MED ORDER — BUTALBITAL-APAP-CAFFEINE 50-325-40 MG PO TABS: ORAL_TABLET | ORAL | 0 refills | Status: DC

## 2016-02-08 MED ORDER — VINCRISTINE SULFATE CHEMO INJECTION 1 MG/ML
1.0000 mg | Freq: Once | INTRAVENOUS | Status: AC
  Administered 2016-02-08: 1 mg via INTRAVENOUS
  Filled 2016-02-08: qty 1

## 2016-02-08 MED FILL — HYDROCODON-APAP 10-325: 10-325 | 22 days supply | Qty: 90 | Fill #0

## 2016-02-08 MED FILL — BUTALB-ACETAMIN-CAFF 50-325: 50-325-40 | 22 days supply | Qty: 90 | Fill #0

## 2016-02-08 MED FILL — ONDANSETRON ODT 8 MG TABLET: 8 | 30 days supply | Qty: 90 | Fill #0

## 2016-02-08 MED FILL — LORazepam 0.5 MG TABS: 0.5 | 30 days supply | Qty: 90 | Fill #2

## 2016-02-08 NOTE — Telephone Encounter (Signed)
Gave pt cal & avs °

## 2016-02-08 NOTE — Patient Instructions (Signed)
Vincristine injection What is this medicine? VINCRISTINE (vin KRIS teen) is a chemotherapy drug. It slows the growth of cancer cells. This medicine is used to treat many types of cancer like Hodgkin's disease, leukemia, non-Hodgkin's lymphoma, neuroblastoma (brain cancer), rhabdomyosarcoma, and Wilms' tumor. This medicine may be used for other purposes; ask your health care provider or pharmacist if you have questions. What should I tell my health care provider before I take this medicine? They need to know if you have any of these conditions: -blood disorders -gout -infection (especially chickenpox, cold sores, or herpes) -kidney disease -liver disease -lung disease -nervous system disease like Charcot-Marie-Tooth (CMT) -recent or ongoing radiation therapy -an unusual or allergic reaction to vincristine, other chemotherapy agents, other medicines, foods, dyes, or preservatives -pregnant or trying to get pregnant -breast-feeding How should I use this medicine? This drug is given as an infusion into a vein. It is administered in a hospital or clinic by a specially trained health care professional. If you have pain, swelling, burning, or any unusual feeling around the site of your injection, tell your health care professional right away. Talk to your pediatrician regarding the use of this medicine in children. While this drug may be prescribed for selected conditions, precautions do apply. Overdosage: If you think you have taken too much of this medicine contact a poison control center or emergency room at once. NOTE: This medicine is only for you. Do not share this medicine with others. What if I miss a dose? It is important not to miss your dose. Call your doctor or health care professional if you are unable to keep an appointment. What may interact with this medicine? Do not take this medicine with any of the following medications: -itraconazole -mibefradil -voriconazole This medicine  may also interact with the following medications: -cyclosporine -erythromycin -fluconazole -ketoconazole -medicines for HIV like delavirdine, efavirenz, nevirapine -medicines for seizures like ethotoin, fosphenotoin, phenytoin -medicines to increase blood counts like filgrastim, pegfilgrastim, sargramostim -other chemotherapy drugs like cisplatin, L-asparaginase, methotrexate, mitomycin, paclitaxel -pegaspargase -vaccines -zalcitabine, ddC Talk to your doctor or health care professional before taking any of these medicines: -acetaminophen -aspirin -ibuprofen -ketoprofen -naproxen This list may not describe all possible interactions. Give your health care provider a list of all the medicines, herbs, non-prescription drugs, or dietary supplements you use. Also tell them if you smoke, drink alcohol, or use illegal drugs. Some items may interact with your medicine. What should I watch for while using this medicine? Your condition will be monitored carefully while you are receiving this medicine. You will need important blood work done while you are taking this medicine. This drug may make you feel generally unwell. This is not uncommon, as chemotherapy can affect healthy cells as well as cancer cells. Report any side effects. Continue your course of treatment even though you feel ill unless your doctor tells you to stop. In some cases, you may be given additional medicines to help with side effects. Follow all directions for their use. Call your doctor or health care professional for advice if you get a fever, chills or sore throat, or other symptoms of a cold or flu. Do not treat yourself. Avoid taking products that contain aspirin, acetaminophen, ibuprofen, naproxen, or ketoprofen unless instructed by your doctor. These medicines may hide a fever. Do not become pregnant while taking this medicine. Women should inform their doctor if they wish to become pregnant or think they might be pregnant.  There is a potential for serious side effects to  an unborn child. Talk to your health care professional or pharmacist for more information. Do not breast-feed an infant while taking this medicine. Men may have a lower sperm count while taking this medicine. Talk to your doctor if you plan to father a child. What side effects may I notice from receiving this medicine? Side effects that you should report to your doctor or health care professional as soon as possible: -allergic reactions like skin rash, itching or hives, swelling of the face, lips, or tongue -breathing problems -confusion or changes in emotions or moods -constipation -cough -mouth sores -muscle weakness -nausea and vomiting -pain, swelling, redness or irritation at the injection site -pain, tingling, numbness in the hands or feet -problems with balance, talking, walking -seizures -stomach pain -trouble passing urine or change in the amount of urine Side effects that usually do not require medical attention (report to your doctor or health care professional if they continue or are bothersome): -diarrhea -hair loss -jaw pain -loss of appetite This list may not describe all possible side effects. Call your doctor for medical advice about side effects. You may report side effects to FDA at 1-800-FDA-1088. Where should I keep my medicine? This drug is given in a hospital or clinic and will not be stored at home. NOTE: This sheet is a summary. It may not cover all possible information. If you have questions about this medicine, talk to your doctor, pharmacist, or health care provider.    2016, Elsevier/Gold Standard. (2008-03-31 17:17:13)

## 2016-02-08 NOTE — Progress Notes (Signed)
[place] OFFICE PROGRESS NOTE  Patient Care Team: Lavone Orn, MD as PCP - General (Internal Medicine) Ashok Pall, MD as Consulting Physician (Neurosurgery) Burnell Blanks. Salomon Fick, MD as Consulting Physician (Neurosurgery) Burnell Blanks. 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     09/27/2013 Pathology Results    Accession: [id number] pathology showed grade 2 oligodendroglioma with mutant IDH1 protein, ATRX, OLIG 2 expression. p53 is negative, low Ki-67 labeling index. Molecular SNP array studies demonstrate whole arm 1p19q codeletion."     10/28/2013 Imaging    Ct scan of head showed possible abscess.     11/06/2013 Imaging    Repeat CT scan of the head showed resolution of abscess.     01/23/2014 Surgery    Dr. Christella Noa perform a subtotal gross resection of the tumor.     01/24/2014 Imaging    Repeat imaging studies showed persistent and residual disease.     03/03/2014 - 09/01/2014 Chemotherapy    She was started on Temodar, 5 days every 28 days     03/13/2014 Imaging    Repeat MRI showed no evidence of progression of disease.     07/14/2014 Imaging    Repeat MRI showed no evidence of disease progression.     09/12/2014 Imaging    MRI of the liver show mild regression of the size of lesions, presumed to be benign     09/15/2014 Imaging    Repeat MRI of the brain show persistent abnormalities, suspicious for residual disease     03/02/2015 Imaging     MRI brain at [place] show persistent abnormalities     08/10/2015 Imaging    Repeat MRI [facility] showed stable disease     08/24/2015 -  Chemotherapy    She was started on Lomustine, Procarbazine and Vincristine chemotherapy     08/30/2015 Imaging     CT chest showed no evidence of aspiration pneumonia     11/18/2015 Imaging    MRI brain showed status post resection of right frontal lobe tumor without residual or recurrent tumor. 2. Otherwise negative MRI of the brain.       INTERVAL HISTORY: Please see below for problem oriented charting. She is seen prior to day 1 cycle 4 She complained of dizziness She denies worsening seizure activities since I last saw her She complained of persistent nausea but no vomiting Denies recent constipation No recent asthmatic attack. She desired a referral to Allergy clinic She continues to have headaches No worsening neuropathy She complained of knee pain due to recent injury Her headaches are stable  REVIEW OF SYSTEMS:   Constitutional: Denies fevers, chills or abnormal weight loss Eyes: Denies blurriness of vision Ears, nose, mouth, throat, and face: Denies mucositis or sore throat Respiratory: Denies cough, dyspnea or wheezes Cardiovascular: Denies palpitation, chest discomfort or lower extremity swelling Skin: Denies abnormal skin rashes Lymphatics: Denies new lymphadenopathy or easy bruising Neurological:Denies numbness, tingling or new weaknesses Behavioral/Psych: Mood is stable, no new changes  All other systems were reviewed with the patient and are negative.  I have reviewed the past medical history, past surgical history, social history and family history with the patient and they are unchanged from previous note.  ALLERGIES:  is allergic to adhesive [  tape]; latex; percocet [oxycodone-acetaminophen]; shellfish allergy; skin adhesives; soma [carisoprodol]; tramadol hcl; and vancomycin.  MEDICATIONS:  Current Outpatient Prescriptions  Medication Sig Dispense Refill  . albuterol (PROVENTIL HFA;VENTOLIN HFA) 108 (90 BASE) MCG/ACT inhaler Inhale 2 puffs into the lungs every 6 (six) hours as needed for wheezing or shortness of breath. Reported on 08/11/2015    .  butalbital-acetaminophen-caffeine (FIORICET, ESGIC) 50-325-40 MG tablet TAKE 1 TABLET BY MOUTH EVERY 6 HOURS AS NEEDED FOR HEADACE. 90 tablet 0  . carisoprodol (SOMA) 350 MG tablet Take 1 tablet (350 mg total) by mouth 4 (four) times daily. For seizure prevention 100 tablet 0  . cetirizine (ZYRTEC) 10 MG tablet Take 10 mg by mouth daily.    . divalproex (DEPAKOTE ER) 250 MG 24 hr tablet TAKE 2 TABLETS BY MOUTH IN THE MORNING AND 3 TABLETS EVERY EVENING 450 tablet 1  . famotidine (PEPCID) 20 MG tablet One at bedtime 30 tablet 2  . gabapentin (NEURONTIN) 300 MG capsule TAKE 3 CAPSULES BY MOUTH IN THE MORNING AND 3 CAPSULES IN THE EVENING 540 capsule 1  . GLEOSTINE 100 MG capsule TAKE 2 CAPSULES BY MOUTH ONCE. TAKE ON AN EMTPY STOMACH 1 HOUR BEFORE OR 2 HOURS AFTER MEALS. CAUTION:CHEMOTHERAPY 2 capsule 0  . GLEOSTINE 40 MG capsule TAKE 1 CAPSULE BY MOUTH ONCE. TAKE ON AN EMPTY STOMACH 1 HOUR BEFORE OR 2 HOURS AFTER A MEAL. CAUTION:CHEMOTHERAPY 1 capsule 0  . HYDROcodone-acetaminophen (NORCO) 10-325 MG tablet Take 1 tablet by mouth every 6 (six) hours as needed. 90 tablet 0  . levalbuterol (XOPENEX HFA) 45 MCG/ACT inhaler Inhale 1 puff into the lungs every 4 (four) hours as needed for wheezing. 1 Inhaler 12  . lidocaine-prilocaine (EMLA) cream Apply 1 application topically as needed. 30 g 6  . LORazepam (ATIVAN) 0.5 MG tablet Take 1 tablet (0.5 mg total) by mouth every 8 (eight) hours as needed for anxiety or seizure. 90 tablet 2  . morphine (MSIR) 15 MG tablet Take 1 tablet (15 mg total) by mouth every 6 (six) hours as needed for severe pain. 90 tablet 0  . ondansetron (ZOFRAN ODT) 8 MG disintegrating tablet Take 1 tablet (8 mg total) by mouth every 8 (eight) hours as needed for nausea or vomiting. 90 tablet 3  . ondansetron (ZOFRAN) 8 MG tablet Take 1 tablet (8 mg total) by mouth every 8 (eight) hours as needed for nausea. 90 tablet 3  . pantoprazole (PROTONIX) 40 MG tablet Take 1 tablet (40 mg total)  by mouth daily. Take 30-60 min before first meal of the day 30 tablet 2  . procarbazine (MATULANE) 50 MG capsule Take 2 capsules (100 mg total) by mouth daily. Follow low tyramine diet. Take for 14 days starting on Day 8. Repeat every 6 weeks 28 capsule 12  . promethazine (PHENERGAN) 25 MG tablet Take 25 mg by mouth every 6 (six) hours as needed for nausea or vomiting. Reported on 09/21/2015    . vinCRIStine 2 mg in sodium chloride 0.9 % 50 mL as directed.     No current facility-administered medications for this visit.    Facility-Administered Medications Ordered in Other Visits  Medication Dose Route Frequency Provider Last Rate Last Dose  . sodium chloride flush (NS) 0.9 % injection 10 mL  10 mL Intracatheter PRN Heath Lark, MD   10 mL at 02/08/16 1505    PHYSICAL EXAMINATION: ECOG PERFORMANCE STATUS: 1 - Symptomatic but completely ambulatory  Vitals:   02/08/16 1155  BP: 110/61  Pulse: (!) 105  Resp: 18  Temp: 98.9 F (37.2 C)   Filed Weights   02/08/16 1155  Weight: 160 lb 3.2 oz (72.7 kg)    GENERAL:alert, no distress and comfortable SKIN: skin color, texture, turgor are normal, no rashes or significant lesions EYES: normal, Conjunctiva are pink and non-injected, sclera clear OROPHARYNX:no exudate, no erythema and lips, buccal mucosa, and tongue normal  NECK: supple, thyroid normal size, non-tender, without nodularity LYMPH:  no palpable lymphadenopathy in the cervical, axillary or inguinal LUNGS: clear to auscultation and percussion with normal breathing effort HEART: regular rate & rhythm and no murmurs and no lower extremity edema ABDOMEN:abdomen soft, non-tender and normal bowel sounds Musculoskeletal:no cyanosis of digits and no clubbing. No knee swelling NEURO: alert & oriented x 3 with fluent speech, no focal motor/sensory deficits  LABORATORY DATA:  I have reviewed the data as listed    Component Value Date/Time   NA 139 02/08/2016 1055   K 4.4 02/08/2016 1055    CL 105 10/02/2015 1215   CO2 24 02/08/2016 1055   GLUCOSE 97 02/08/2016 1055   BUN 12.5 02/08/2016 1055   CREATININE 0.7 02/08/2016 1055   CALCIUM 9.0 02/08/2016 1055   PROT 6.9 02/08/2016 1055   ALBUMIN 4.0 02/08/2016 1055   AST 19 02/08/2016 1055   ALT 17 02/08/2016 1055   ALKPHOS 34 (L) 02/08/2016 1055   BILITOT 0.55 02/08/2016 1055   GFRNONAA >60 10/02/2015 1215   GFRAA >60 10/02/2015 1215    No results found for: SPEP, UPEP  Lab Results  Component Value Date   WBC 2.4 (L) 02/08/2016   NEUTROABS 1.6 02/08/2016   HGB 10.7 (L) 02/08/2016   HCT 29.7 (L) 02/08/2016   MCV 97.1 02/08/2016   PLT 117 (L) 02/08/2016      Chemistry      Component Value Date/Time   NA 139 02/08/2016 1055   K 4.4 02/08/2016 1055   CL 105 10/02/2015 1215   CO2 24 02/08/2016 1055   BUN 12.5 02/08/2016 1055   CREATININE 0.7 02/08/2016 1055      Component Value Date/Time   CALCIUM 9.0 02/08/2016 1055   ALKPHOS 34 (L) 02/08/2016 1055   AST 19 02/08/2016 1055   ALT 17 02/08/2016 1055   BILITOT 0.55 02/08/2016 1055      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 I am concerned about side effects from treatment. The MRI is reported with no residual disease although persistent abnormalities are noted on the MRI. Plan would be to do 2 more cycles of treatment and repeat MRI in August before she proceed with potential subtotal resection in the near future.  Due to neuropathy and uncontrolled nausea along with mild pancytopenia, she will only receive vincristine the 50% dose adjustment along with other chemotherapy dose adjustment.  Pancytopenia due to antineoplastic chemotherapy [facility]) This is likely due to recent treatment. The patient denies recent history of bleeding such as epistaxis, hematuria or hematochezia. No recent infection. She is  asymptomatic from the low platelet count and low white blood cell count. I will observe for now.  she does not require transfusion now. I will continue the chemotherapy at current dose with dose adjustment.   Nausea & vomiting She has recent uncontrolled nausea and vomiting. 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 The last episode of seizure was precipitated by stress from a cousin's wedding. 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 I plan to reduce the dose of vincristine She will continue her current pain management  Intractable chronic cluster headache She has chronic headache which is multifactorial, related to prior resection with neuropathic pain component We will continue conservative management with her current medications.  I refilled multiple prescriptions    Dehydration She complained of dizziness with signs of dehydration including feeling thirsty, tachycardiac and mildly hypotensive I will give her 1 liter of normal saline along with her chemo treatment today    Orders Placed This Encounter  Procedures  . Ambulatory referral to Allergy    Referral Priority:   Routine    Referral Type:   Allergy Testing    Referral Reason:   Specialty Services Required    Requested Specialty:   Allergy    Number of Visits Requested:   1   All questions were answered. The patient knows to call the clinic with any problems, questions or concerns. No barriers to learning was detected. I spent 25 minutes counseling the patient face to face. The total time spent in the appointment was 40 minutes and more than 50% was on counseling and  review of test results     [facility], [place], MD 02/08/2016 4:03 PM

## 2016-02-09 DIAGNOSIS — E860 Dehydration: Secondary | ICD-10-CM | POA: Insufficient documentation

## 2016-02-09 NOTE — Assessment & Plan Note (Signed)
She complained of dizziness with signs of dehydration including feeling thirsty, tachycardiac and mildly hypotensive I will give her 1 liter of normal saline along with her chemo treatment today

## 2016-02-09 NOTE — Assessment & Plan Note (Signed)
Recent MRI in May 2017 showed no major changes in the MRI. I am concerned about side effects from treatment. The MRI is reported with no residual disease although persistent abnormalities are noted on the MRI. Plan would be to do 2 more cycles of treatment and repeat MRI in August before she proceed with potential subtotal resection in the near future.  Due to neuropathy and uncontrolled nausea along with mild pancytopenia, she will only receive vincristine the 50% dose adjustment along with other chemotherapy dose adjustment.

## 2016-02-09 NOTE — Assessment & Plan Note (Signed)
The patient desire second opinion from Allergy and Immunology She was recently seen in Pulmonology and she is not satisfied I will send a referral

## 2016-02-09 NOTE — Assessment & Plan Note (Signed)
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  I refilled multiple prescriptions

## 2016-02-09 NOTE — Assessment & Plan Note (Signed)
The patient has recurrent seizures. The last episode of seizure was precipitated by stress from a cousin's wedding. She will continue anti-seizure medications as prescribed

## 2016-02-09 NOTE — Assessment & Plan Note (Signed)
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 I plan to reduce the dose of vincristine She will continue her current pain management

## 2016-02-09 NOTE — Assessment & Plan Note (Signed)
She has recent uncontrolled nausea and vomiting. We will continue premedication with Aloxi and ODT Zofran prn

## 2016-02-09 NOTE — Assessment & Plan Note (Signed)
This is likely due to recent treatment. The patient denies recent history of bleeding such as epistaxis, hematuria or hematochezia. No recent infection. She is asymptomatic from the low platelet count and low white blood cell count. I will observe for now.  she does not require transfusion now. I will continue the chemotherapy at current dose with dose adjustment.

## 2016-02-10 ENCOUNTER — Telehealth: Payer: Self-pay | Admitting: Hematology and Oncology

## 2016-02-10 NOTE — Telephone Encounter (Signed)
Did  Referral yesterday, pt was given apt time when she was here.

## 2016-02-11 ENCOUNTER — Telehealth: Payer: Self-pay | Admitting: Internal Medicine

## 2016-02-11 MED FILL — LEVALBUTEROL TAR HFA 45MCG: 45 | 30 days supply | Qty: 15 | Fill #0

## 2016-02-11 NOTE — Telephone Encounter (Signed)
Called [employer] at [phone]. Pt's ID: [id number] N. Spoke to [employer]. Xopenex has been approved. I have called [facility]. They are aware that this medication has been approved. Nothing further was needed.

## 2016-02-15 ENCOUNTER — Telehealth: Payer: Self-pay | Admitting: Hematology and Oncology

## 2016-02-15 ENCOUNTER — Telehealth: Payer: Self-pay | Admitting: *Deleted

## 2016-02-15 ENCOUNTER — Other Ambulatory Visit: Payer: Self-pay | Admitting: Hematology and Oncology

## 2016-02-15 NOTE — Telephone Encounter (Signed)
Spoke with pt to confirm ivf/lab appt date/times per 7/31 pof

## 2016-02-15 NOTE — Telephone Encounter (Signed)
Pt reports feeling very sick X 3 days, diarrhea X 2 weeks, has been nauseated and vomiting. Stopped the protonix prescribed by Dr Melvyn Novas and this helped relieve the side pain. Dr Alvy Bimler wants patient to start imodium and come in tomorrow for labs and IVF, plus IVF on Wednesday. PT verbalized understanding

## 2016-02-16 ENCOUNTER — Ambulatory Visit (HOSPITAL_BASED_OUTPATIENT_CLINIC_OR_DEPARTMENT_OTHER): Payer: Medicaid Other

## 2016-02-16 ENCOUNTER — Other Ambulatory Visit (HOSPITAL_BASED_OUTPATIENT_CLINIC_OR_DEPARTMENT_OTHER): Payer: Medicaid Other

## 2016-02-16 ENCOUNTER — Ambulatory Visit: Payer: Medicaid Other

## 2016-02-16 VITALS — BP 117/80 | HR 91 | Temp 98.2°F | Resp 16

## 2016-02-16 DIAGNOSIS — C711 Malignant neoplasm of frontal lobe: Secondary | ICD-10-CM

## 2016-02-16 LAB — COMPREHENSIVE METABOLIC PANEL
ALT: 22 U/L (ref 0–55)
AST: 19 U/L (ref 5–34)
Albumin: 4.2 g/dL (ref 3.5–5.0)
Alkaline Phosphatase: 35 U/L — ABNORMAL LOW (ref 40–150)
Anion Gap: 8 mEq/L (ref 3–11)
BUN: 11.8 mg/dL (ref 7.0–26.0)
CO2: 24 mEq/L (ref 22–29)
Calcium: 9.2 mg/dL (ref 8.4–10.4)
Chloride: 110 mEq/L — ABNORMAL HIGH (ref 98–109)
Creatinine: 0.8 mg/dL (ref 0.6–1.1)
EGFR: >90 mL/min/{1.73_m2} (ref >90)
Glucose: 82 mg/dl (ref 70–140)
Potassium: 4.1 mEq/L (ref 3.5–5.1)
Sodium: 142 mEq/L (ref 136–145)
Total Bilirubin: 0.42 mg/dL (ref 0.20–1.20)
Total Protein: 6.8 g/dL (ref 6.4–8.3)

## 2016-02-16 LAB — CBC WITH DIFFERENTIAL/PLATELET
BASO%: 0.2 % (ref 0.0–2.0)
Basophils Absolute: 0 10*3/uL (ref 0.0–0.1)
EOS%: 0.5 % (ref 0.0–7.0)
Eosinophils Absolute: 0 10*3/uL (ref 0.0–0.5)
HCT: 28.2 % — ABNORMAL LOW (ref 34.8–46.6)
HGB: 9.7 g/dL — ABNORMAL LOW (ref 11.6–15.9)
LYMPH%: 29.1 % (ref 14.0–49.7)
MCH: 34.7 pg — ABNORMAL HIGH (ref 25.1–34.0)
MCHC: 34.3 g/dL (ref 31.5–36.0)
MCV: 101.2 fL — ABNORMAL HIGH (ref 79.5–101.0)
MONO#: 0.2 10*3/uL (ref 0.1–0.9)
MONO%: 8.9 % (ref 0.0–14.0)
NEUT#: 1.1 10*3/uL — ABNORMAL LOW (ref 1.5–6.5)
NEUT%: 61.3 % (ref 38.4–76.8)
Platelets: 142 10*3/uL — ABNORMAL LOW (ref 145–400)
RBC: 2.79 10*6/uL — ABNORMAL LOW (ref 3.70–5.45)
RDW: 13.7 % (ref 11.2–14.5)
WBC: 1.9 10*3/uL — ABNORMAL LOW (ref 3.9–10.3)
lymph#: 0.5 10*3/uL — ABNORMAL LOW (ref 0.9–3.3)

## 2016-02-16 MED ORDER — HEPARIN SOD (PORK) LOCK FLUSH 100 UNIT/ML IV SOLN
500.0000 [IU] | Freq: Once | INTRAVENOUS | Status: AC | PRN
  Administered 2016-02-16: 500 [IU] via INTRAVENOUS
  Filled 2016-02-16: qty 5

## 2016-02-16 MED ORDER — SODIUM CHLORIDE 0.9 % IV SOLN
1000.0000 mL | Freq: Once | INTRAVENOUS | Status: AC
  Administered 2016-02-16: 1000 mL via INTRAVENOUS

## 2016-02-16 MED ORDER — SODIUM CHLORIDE 0.9 % IJ SOLN
10.0000 mL | INTRAMUSCULAR | Status: DC | PRN
  Administered 2016-02-16: 10 mL via INTRAVENOUS
  Filled 2016-02-16: qty 10

## 2016-02-16 NOTE — Patient Instructions (Signed)

## 2016-02-16 NOTE — Patient Instructions (Signed)

## 2016-02-17 ENCOUNTER — Encounter: Payer: Self-pay | Admitting: *Deleted

## 2016-02-17 ENCOUNTER — Telehealth: Payer: Self-pay | Admitting: *Deleted

## 2016-02-17 ENCOUNTER — Encounter (HOSPITAL_COMMUNITY): Payer: Self-pay | Admitting: Emergency Medicine

## 2016-02-17 ENCOUNTER — Other Ambulatory Visit: Payer: Self-pay | Admitting: *Deleted

## 2016-02-17 ENCOUNTER — Emergency Department (HOSPITAL_COMMUNITY)
Admission: EM | Admit: 2016-02-17 | Discharge: 2016-02-17 | Disposition: A | Discharge status: Home / Self Care | Payer: Medicaid Other | Attending: Emergency Medicine | Admitting: Emergency Medicine

## 2016-02-17 ENCOUNTER — Ambulatory Visit: Payer: Medicaid Other

## 2016-02-17 DIAGNOSIS — T451X1A Poisoning by antineoplastic and immunosuppressive drugs, accidental (unintentional), initial encounter: Secondary | ICD-10-CM | POA: Insufficient documentation

## 2016-02-17 DIAGNOSIS — Z85828 Personal history of other malignant neoplasm of skin: Secondary | ICD-10-CM | POA: Diagnosis not present

## 2016-02-17 DIAGNOSIS — Z91040 Latex allergy status: Secondary | ICD-10-CM | POA: Insufficient documentation

## 2016-02-17 DIAGNOSIS — Z85841 Personal history of malignant neoplasm of brain: Secondary | ICD-10-CM | POA: Diagnosis not present

## 2016-02-17 DIAGNOSIS — Z86011 Personal history of benign neoplasm of the brain: Secondary | ICD-10-CM | POA: Diagnosis not present

## 2016-02-17 DIAGNOSIS — R112 Nausea with vomiting, unspecified: Secondary | ICD-10-CM | POA: Diagnosis present

## 2016-02-17 DIAGNOSIS — R197 Diarrhea, unspecified: Secondary | ICD-10-CM | POA: Insufficient documentation

## 2016-02-17 DIAGNOSIS — T7840XA Allergy, unspecified, initial encounter: Secondary | ICD-10-CM

## 2016-02-17 DIAGNOSIS — C711 Malignant neoplasm of frontal lobe: Secondary | ICD-10-CM

## 2016-02-17 DIAGNOSIS — J45909 Unspecified asthma, uncomplicated: Secondary | ICD-10-CM | POA: Insufficient documentation

## 2016-02-17 LAB — COMPREHENSIVE METABOLIC PANEL
ALT: 22 U/L (ref 14–54)
AST: 19 U/L (ref 15–41)
Albumin: 4.1 g/dL (ref 3.5–5.0)
Alkaline Phosphatase: 32 U/L — ABNORMAL LOW (ref 38–126)
Anion gap: 6 (ref 5–15)
BUN: 8 mg/dL (ref 6–20)
CO2: 25 mmol/L (ref 22–32)
Calcium: 9.1 mg/dL (ref 8.9–10.3)
Chloride: 108 mmol/L (ref 101–111)
Creatinine, Ser: 0.69 mg/dL (ref 0.44–1.00)
GFR calc Af Amer: >60 mL/min (ref >60)
GFR calc non Af Amer: >60 mL/min (ref >60)
Glucose, Bld: 93 mg/dL (ref 65–99)
Potassium: 3.8 mmol/L (ref 3.5–5.1)
Sodium: 139 mmol/L (ref 135–145)
Total Bilirubin: 0.6 mg/dL (ref 0.3–1.2)
Total Protein: 6.5 g/dL (ref 6.5–8.1)

## 2016-02-17 LAB — URINALYSIS, ROUTINE W REFLEX MICROSCOPIC
Bilirubin Urine: NEGATIVE
Glucose, UA: NEGATIVE mg/dL
Hgb urine dipstick: NEGATIVE
Ketones, ur: NEGATIVE mg/dL
Leukocytes, UA: NEGATIVE
Nitrite: NEGATIVE
Protein, ur: NEGATIVE mg/dL
Specific Gravity, Urine: 1.021 (ref 1.005–1.030)
pH: 6 (ref 5.0–8.0)

## 2016-02-17 LAB — CBC
HCT: 28 % — ABNORMAL LOW (ref 36.0–46.0)
Hemoglobin: 9.6 g/dL — ABNORMAL LOW (ref 12.0–15.0)
MCH: 33.7 pg (ref 26.0–34.0)
MCHC: 34.3 g/dL (ref 30.0–36.0)
MCV: 98.2 fL (ref 78.0–100.0)
Platelets: 131 10*3/uL — ABNORMAL LOW (ref 150–400)
RBC: 2.85 MIL/uL — ABNORMAL LOW (ref 3.87–5.11)
RDW: 12.7 % (ref 11.5–15.5)
WBC: 1.9 10*3/uL — ABNORMAL LOW (ref 4.0–10.5)

## 2016-02-17 LAB — LIPASE, BLOOD: Lipase: 17 U/L (ref 11–51)

## 2016-02-17 MED ORDER — GABAPENTIN 300 MG PO CAPS
900.0000 mg | ORAL_CAPSULE | Freq: Once | ORAL | Status: AC
  Administered 2016-02-17: 900 mg via ORAL
  Filled 2016-02-17: qty 3

## 2016-02-17 MED ORDER — DIPHENOXYLATE-ATROPINE 2.5-0.025 MG PO TABS: 1.0000 | ORAL_TABLET | Freq: Four times a day (QID) | ORAL | 0 refills | Status: DC | PRN

## 2016-02-17 MED ORDER — DIVALPROEX SODIUM ER 250 MG PO TB24: 250.0000 mg | ORAL_TABLET | Freq: Once | ORAL | Status: DC

## 2016-02-17 MED ORDER — CARISOPRODOL 350 MG PO TABS
350.0000 mg | ORAL_TABLET | Freq: Once | ORAL | Status: AC
Start: 2016-02-17 — End: 2016-02-17
  Administered 2016-02-17: 350 mg via ORAL
  Filled 2016-02-17: qty 1

## 2016-02-17 MED ORDER — DIPHENOXYLATE-ATROPINE 2.5-0.025 MG PO TABS
2.0000 | ORAL_TABLET | Freq: Once | ORAL | Status: AC
  Administered 2016-02-17: 2 via ORAL
  Filled 2016-02-17: qty 2

## 2016-02-17 MED ORDER — ONDANSETRON HCL 4 MG/2ML IJ SOLN
4.0000 mg | Freq: Once | INTRAMUSCULAR | Status: AC
  Administered 2016-02-17: 4 mg via INTRAVENOUS
  Filled 2016-02-17: qty 2

## 2016-02-17 MED ORDER — GI COCKTAIL ~~LOC~~
30.0000 mL | Freq: Once | ORAL | Status: AC
Start: 2016-02-17 — End: 2016-02-17
  Administered 2016-02-17: 30 mL via ORAL
  Filled 2016-02-17: qty 30

## 2016-02-17 MED ORDER — SODIUM CHLORIDE 0.9 % IV BOLUS (SEPSIS)
1000.0000 mL | Freq: Once | INTRAVENOUS | Status: AC
  Administered 2016-02-17: 1000 mL via INTRAVENOUS

## 2016-02-17 MED ORDER — LORAZEPAM 2 MG/ML IJ SOLN
1.0000 mg | Freq: Once | INTRAMUSCULAR | Status: AC
  Administered 2016-02-17: 1 mg via INTRAVENOUS
  Filled 2016-02-17: qty 1

## 2016-02-17 MED ORDER — MORPHINE SULFATE 15 MG PO TABS: 15.0000 mg | ORAL_TABLET | Freq: Four times a day (QID) | ORAL | 0 refills | Status: DC | PRN

## 2016-02-17 MED ORDER — DIVALPROEX SODIUM ER 500 MG PO TB24
500.0000 mg | ORAL_TABLET | Freq: Once | ORAL | Status: AC
  Administered 2016-02-17: 500 mg via ORAL
  Filled 2016-02-17: qty 1

## 2016-02-17 MED ORDER — HEPARIN SOD (PORK) LOCK FLUSH 100 UNIT/ML IV SOLN
500.0000 [IU] | Freq: Once | INTRAVENOUS | Status: AC
  Administered 2016-02-17: 500 [IU]
  Filled 2016-02-17: qty 5

## 2016-02-17 MED ORDER — GABAPENTIN 300 MG PO CAPS: 300.0000 mg | ORAL_CAPSULE | Freq: Once | ORAL | Status: DC

## 2016-02-17 MED FILL — DIPHENOXYLATE/ATROPINE TAB: 2.5-0.025 | 7 days supply | Qty: 30 | Fill #0

## 2016-02-17 MED FILL — MORPHINE SULFATE IR 15 MG T: 15 | 22 days supply | Qty: 90 | Fill #0

## 2016-02-17 NOTE — Telephone Encounter (Signed)
-----   Message from Heath Lark, MD sent at 02/17/2016 11:51 AM EDT ----- Regarding: FW: 'Patient Requests Change in Meds?" Contact: [phone] She needs to stop procarbazine until I see her ----- Message ----- From: Azzie Glatter, RN Sent: 02/16/2016   4:07 PM To: Heath Lark, MD Subject: 'Patient Requests Change in Meds?"             Patient states that she is here today for IVFs. Vital signs stable. Patient states that she has had an episode of nausea X 1 and diarrhea X 4 today and daily since she discontinued Protonix last week.  Patient states that she takes Zofran and Phenergan for nausea, and Imodium for diarrhea.   Patient wanted to know if her labs were good enough for her to discontinue taking Procarbazine.  CBC counts are decreased for today; CMP looks good.  Patient will be in infusion until 5:15pm today.  Please call ext. [phone], I will be leaving today at 4:30.  Patient left her phone number if you wanted to call her.  Patient little anxious, but appearance is good and she shows no signs or symptoms of distress.   Thanks Dr. Alvy Bimler.

## 2016-02-17 NOTE — ED Notes (Signed)
Pt able to ambulate to restroom w/ steady gait.

## 2016-02-17 NOTE — Telephone Encounter (Signed)
Informed patient/family member. They verbalized understanding.

## 2016-02-17 NOTE — ED Triage Notes (Signed)
Pt. reports LLQ and upper abdominal pain with emesis and diarrhea onset last week , denies fever or chills. Currently receiving chemotherapy  For glioma.

## 2016-02-17 NOTE — ED Provider Notes (Signed)
[facility] DEPT Provider Note   CSN: [id number] Arrival date & time: 02/17/16  9024  First Provider Contact:  First MD Initiated Contact with Patient 02/17/16 [phone]        History   Chief Complaint Chief Complaint  Patient presents with  . Abdominal Pain  . Emesis    HPI EUNIE LAWN is a 35 y.o. female.  Patient is a 35 year old female with a history of brain tumor currently on chemotherapy presenting today for diffuse rash that started during the night involving her entire body that was itchy in nature. Also made her throat feel like it was closing up with some mild shortness of breath. His never had an allergic reaction before and took Benadryl prior to coming to the emergency room. Upon arrival here rash is starting to improve. Patient is currently on a cycle of chemotherapy. This is her third cycle which lasts for 2 weeks. At the beginning of the cycle last week she started to develop significant nausea, vomiting and diarrhea that has continued for the last week and a half. She did receive IV fluids at the cancer center yesterday but continues to have nausea and difficulty holding down food. She also has abdominal discomfort which is improving since last week and denies consistent blood in her stool or vomitus. She has had no fevers. She does recall the last time she was on Procardidine that she had shortness of breath throughout the cycle of chemotherapy itching resolved when she stopped. She was started on Protonix the week before she started the chemotherapy however given her severe reactions with the cycle she stopped it because she was concerned for a reaction. She is currently taking Phenergan and Zofran with only minimal improvement in her nausea.      Past Medical History:  Diagnosis Date  . Anemia   . Anxiety   . Asthma    very mild- no inhaler use since July 2013  . Brain tumor ([place])    Hx: of  . Cancer ([place]) 2003   skin  . Chronic fatigue 10/03/2014  .  Cognitive impairment 08/13/2015  . Complication of anesthesia    takes longer for patient to go under anesthesia  . Dizziness   . Dysphasia 02/12/2014  . 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 Headache 02/12/2014  . Headache(784.0)    migraines  . Herniated disc   . Hot flashes 10/03/2014  . Infection    to right head incision  . 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 Liver hemangioma   . Liver lesion 08/22/2014  . Nausea alone 02/12/2014  . Neuropathic pain 04/08/2014  . Petit mal seizure status ([place])   . Seizures ([place])    petit mal serizures most days    Patient Active Problem List   Diagnosis Date Noted  . Dehydration 02/09/2016  . Upper airway cough syndrome 01/28/2016  . Cough variant asthma 01/28/2016  . Dyspnea 01/28/2016  . Drug-induced neutropenia ([place]) 10/12/2015  . 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 Constipation, acute 10/12/2015  . Cognitive impairment 08/13/2015  . Dysphagia, idiopathic 04/30/2015  . Low serum cortisol level ([place]) 10/20/2014  . 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 Chronic fatigue 10/03/2014  . Hot flashes 10/03/2014  . Liver lesion 08/22/2014  . Pancytopenia due to antineoplastic chemotherapy ([place]) 06/23/2014  . Nausea & vomiting 06/23/2014  .  Genetic testing 06/13/2014  . Intractable chronic cluster headache 04/28/2014  . Thrombocytopenia ([place]) 04/28/2014  . Neuropathic pain 04/08/2014  . Simple partial seizures ([place]) 03/20/2014  . Sensory seizure ([place]) 03/20/2014  . Nausea alone 02/12/2014  . Dysphasia 02/12/2014  . Headache 02/12/2014  . Seizures ([place]) 02/12/2014  . Financial difficulty 02/12/2014  . CIN I (cervical intraepithelial neoplasia I) 02/12/2014  . Anemia, unspecified 02/12/2014  . 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 Paresthesias 09/20/2013  . Blurred vision, left eye 09/20/2013  . Asthma 07/24/2009  . CHEST PAIN 07/24/2009    Past Surgical History:  Procedure Laterality Date  . ABDOMINAL HYSTERECTOMY    . CESAREAN SECTION    . COLONOSCOPY WITH PROPOFOL N/A 08/29/2012   Procedure: COLONOSCOPY WITH PROPOFOL;  Surgeon: Arta Silence, MD;  Location: [facility];  Service: Endoscopy;  Laterality: N/A;  . coposcopy  08/06/2012   cervical biopsy  . CRANIOTOMY Right 09/26/2013   Procedure: Awake Right frontal open brain biopsy;  Surgeon: Winfield Cunas, MD;  Location: [facility];  Service: Neurosurgery;  Laterality: Right;  awake Right frontal open brain biopsy  . CRANIOTOMY N/A 10/30/2013   Procedure: debridement of cranial wound  right temporal incision, ;  Surgeon: Winfield Cunas, MD;  Location: [facility];  Service: Neurosurgery;  Laterality: N/A;  . CRANIOTOMY N/A 01/23/2014   Procedure: Awake Craniotomy with BrainLab;  Surgeon: Winfield Cunas, MD;  Location: [facility];  Service: Neurosurgery;  Laterality: N/A;  Awake Craniotomy  tumor with BrainLab  . ESOPHAGOGASTRODUODENOSCOPY (EGD) WITH PROPOFOL N/A 08/29/2012   Procedure: ESOPHAGOGASTRODUODENOSCOPY (EGD) WITH PROPOFOL;  Surgeon: Arta Silence, MD;  Location: [facility];  Service: Endoscopy;  Laterality: N/A;  . left knee surgery  2000   torn meniscus  . right flank surgery  2005   bnign mass from flank area-right  . ROBOTIC ASSISTED TOTAL HYSTERECTOMY WITH BILATERAL SALPINGO OOPHERECTOMY Bilateral 09/25/2012   Procedure: ROBOTIC ASSISTED TOTAL HYSTERECTOMY WITH BILATERAL SALPINGO OOPHORECTOMY;  Surgeon: Maeola Sarah. Landry Mellow, MD;  Location: [facility];  Service: Gynecology;  Laterality: Bilateral;  . ROTATOR CUFF REPAIR    . WISDOM TOOTH EXTRACTION      OB History    No data available       Home Medications    Prior to Admission  medications   Medication Sig Start Date End Date Taking? Authorizing Provider  albuterol (PROVENTIL HFA;VENTOLIN HFA) 108 (90 BASE) MCG/ACT inhaler Inhale 2 puffs into the lungs every 6 (six) hours as needed for wheezing or shortness of breath. Reported on 08/11/2015    Historical Provider, MD  butalbital-acetaminophen-caffeine (FIORICET, ESGIC) 50-325-40 MG tablet TAKE 1 TABLET BY MOUTH EVERY 6 HOURS AS NEEDED FOR HEADACE. 02/08/16   Heath Lark, MD  carisoprodol (SOMA) 350 MG tablet Take 1 tablet (350 mg total) by mouth 4 (four) times daily. For seizure prevention 01/01/16   Heath Lark, MD  cetirizine (ZYRTEC) 10 MG tablet Take 10 mg by mouth daily.    Historical Provider, MD  divalproex (DEPAKOTE ER) 250 MG 24 hr tablet TAKE 2 TABLETS BY MOUTH IN THE MORNING AND 3 TABLETS EVERY EVENING 10/05/15   Cameron Sprang, MD  famotidine (PEPCID) 20 MG tablet One at bedtime 01/28/16   Tanda Rockers, MD  gabapentin (NEURONTIN) 300 MG capsule TAKE 3 CAPSULES BY MOUTH IN THE MORNING AND 3 CAPSULES IN  THE EVENING 10/05/15   Cameron Sprang, MD  GLEOSTINE 100 MG capsule TAKE 2 CAPSULES BY MOUTH ONCE. TAKE ON AN EMTPY STOMACH 1 HOUR BEFORE OR 2 HOURS AFTER MEALS. CAUTION:CHEMOTHERAPY 01/26/16   Ni Gorsuch, MD  GLEOSTINE 40 MG capsule TAKE 1 CAPSULE BY MOUTH ONCE. TAKE ON AN EMPTY STOMACH 1 HOUR BEFORE OR 2 HOURS AFTER A MEAL. CAUTION:CHEMOTHERAPY 01/26/16   Heath Lark, MD  HYDROcodone-acetaminophen (NORCO) 10-325 MG tablet Take 1 tablet by mouth every 6 (six) hours as needed. 02/08/16   Heath Lark, MD  levalbuterol (XOPENEX HFA) 45 MCG/ACT inhaler Inhale 1 puff into the lungs every 4 (four) hours as needed for wheezing. 01/29/16   Tanda Rockers, MD  lidocaine-prilocaine (EMLA) cream Apply 1 application topically as needed. 09/21/15   Heath Lark, MD  LORazepam (ATIVAN) 0.5 MG tablet Take 1 tablet (0.5 mg total) by mouth every 8 (eight) hours as needed for anxiety or seizure. 12/04/15   Heath Lark, MD  morphine (MSIR) 15 MG  tablet Take 1 tablet (15 mg total) by mouth every 6 (six) hours as needed for severe pain. 01/26/16   Heath Lark, MD  ondansetron (ZOFRAN ODT) 8 MG disintegrating tablet Take 1 tablet (8 mg total) by mouth every 8 (eight) hours as needed for nausea or vomiting. 02/08/16   Heath Lark, MD  ondansetron (ZOFRAN) 8 MG tablet Take 1 tablet (8 mg total) by mouth every 8 (eight) hours as needed for nausea. 12/16/15   Heath Lark, MD  pantoprazole (PROTONIX) 40 MG tablet Take 1 tablet (40 mg total) by mouth daily. Take 30-60 min before first meal of the day 01/28/16   Tanda Rockers, MD  procarbazine (MATULANE) 50 MG capsule Take 2 capsules (100 mg total) by mouth daily. Follow low tyramine diet. Take for 14 days starting on Day 8. Repeat every 6 weeks 02/04/16   Heath Lark, MD  promethazine (PHENERGAN) 25 MG tablet Take 25 mg by mouth every 6 (six) hours as needed for nausea or vomiting. Reported on 09/21/2015    Historical Provider, MD  vinCRIStine 2 mg in sodium chloride 0.9 % 50 mL as directed.    Historical Provider, MD    Family History Family History  Problem Relation Age of Onset  . Hypertension Mother   . Skin cancer Mother   . Thyroid cancer Father   . Skin cancer Father   . Breast cancer Maternal Aunt     dx in her 49s  . Prostate cancer Maternal Uncle 63  . Melanoma Maternal Uncle   . Ovarian cancer Maternal Grandmother     dx in her late 21s-40s  . Breast cancer Maternal Aunt     bilateral breast cancer dx in 81s and 67s; possibly BRCA+  . Other Son     multiple CAL spots, bright spots on brain MRI, ? chiari malformation    Social History Social History  Substance Use Topics  . Smoking status: Never Smoker  . Smokeless tobacco: Never Used  . Alcohol use No     Allergies   Adhesive [tape]; Latex; Percocet [oxycodone-acetaminophen]; Shellfish allergy; Skin adhesives; Soma [carisoprodol]; Tramadol hcl; and Vancomycin   Review of Systems Review of Systems  All other systems  reviewed and are negative.    Physical Exam Updated Vital Signs BP 107/76   Pulse (!) 59   Temp 97.8 F (36.6 C) (Oral)   Resp 20   Ht 5' 9" (1.753 m)   Wt 153 lb (69.4  kg)   LMP 09/20/2012   SpO2 100%   BMI 22.59 kg/m   Physical Exam  Constitutional: She is oriented to person, place, and time. She appears well-developed and well-nourished. No distress.  HENT:  Head: Normocephalic and atraumatic.  Mouth/Throat: Mucous membranes are dry.  Eyes: EOM are normal. Pupils are equal, round, and reactive to light.  Cardiovascular: Normal rate, regular rhythm, normal heart sounds and intact distal pulses.  Exam reveals no friction rub.   No murmur heard. Pulmonary/Chest: Effort normal and breath sounds normal. She has no wheezes. She has no rales.  Abdominal: Soft. Bowel sounds are normal. She exhibits no distension. There is tenderness. There is guarding. There is no rebound.  Epigastric, right upper quadrant and left upper quadrant tenderness to palpation with mild guarding  Musculoskeletal: Normal range of motion. She exhibits no tenderness.  No edema  Neurological: She is alert and oriented to person, place, and time. No cranial nerve deficit.  Skin: Skin is warm and dry. No rash noted.  Psychiatric: She has a normal mood and affect. Her behavior is normal.  Nursing note and vitals reviewed.    ED Treatments / Results  Labs (all labs ordered are listed, but only abnormal results are displayed) Labs Reviewed  COMPREHENSIVE METABOLIC PANEL - Abnormal; Notable for the following:       Result Value   Alkaline Phosphatase 32 (*)    All other components within normal limits  CBC - Abnormal; Notable for the following:    WBC 1.9 (*)    RBC 2.85 (*)    Hemoglobin 9.6 (*)    HCT 28.0 (*)    Platelets 131 (*)    All other components within normal limits  LIPASE, BLOOD  URINALYSIS, ROUTINE W REFLEX MICROSCOPIC (NOT AT [facility])    EKG  EKG Interpretation None        Radiology No results found.  Procedures Procedures (including critical care time)  Medications Ordered in ED Medications  sodium chloride 0.9 % bolus 1,000 mL (not administered)  ondansetron (ZOFRAN) injection 4 mg (not administered)  sodium chloride 0.9 % bolus 1,000 mL (not administered)     Initial Impression / Assessment and Plan / ED Course  I have reviewed the triage vital signs and the nursing notes.  Pertinent labs & imaging results that were available during my care of the patient were reviewed by me and considered in my medical decision making (see chart for details).  Clinical Course   Patient is a 35 year old female with a history of a brain tumor who is currently on chemotherapy presenting today with persistent nausea, vomiting and diarrhea since starting her third cycle of chemotherapy last week however this is not what brought her in. Last night she developed a diffuse rash with itching and feeling that her throat was closing. She took Benadryl and came to the ER. Upon arrival here symptoms are starting to improve but she has never reacted this way before. However during her last cycle of the same chemotherapy medication she did have issues with shortness of breath until she finished the cycle medication and symptoms resolved. Patient currently has only a very faint rash over her upper chest with no respiratory issues at this time. She has some moderate upper abdominal pain which is been ongoing since last week with the chemotherapy and recurrent diarrhea. Feel most like patient is having a reaction to her chemotherapy causing the nausea, vomiting and diarrhea however also feel that she may be  having allergic reaction and recommended she stop taking procardistine.  Final Clinical Impressions(s) / ED Diagnoses   Final diagnoses:  Nausea vomiting and diarrhea  Allergic reaction caused by a drug    New Prescriptions New Prescriptions   DIPHENOXYLATE-ATROPINE  (LOMOTIL) 2.5-0.025 MG TABLET    Take 1 tablet by mouth 4 (four) times daily as needed for diarrhea or loose stools.     Blanchie Dessert, MD 02/17/16 1226

## 2016-02-17 NOTE — Progress Notes (Unsigned)
Received called from ED Physican, notifying us about patient ED admission (rash from head to toe, throat swelling, itching, diarrhea, and vomiting.) and stating that patient this reaction is possibly due to recent chemotherapy. MD Alvy Bimler notified. MD does not think that this is the cause of her reaction and states that diarrhea/

## 2016-02-19 ENCOUNTER — Telehealth: Payer: Self-pay | Admitting: Hematology and Oncology

## 2016-02-19 ENCOUNTER — Other Ambulatory Visit: Payer: Self-pay | Admitting: Hematology and Oncology

## 2016-02-19 ENCOUNTER — Telehealth: Payer: Self-pay | Admitting: *Deleted

## 2016-02-19 DIAGNOSIS — C711 Malignant neoplasm of frontal lobe: Secondary | ICD-10-CM

## 2016-02-19 DIAGNOSIS — K769 Liver disease, unspecified: Secondary | ICD-10-CM

## 2016-02-19 MED FILL — CARISOPRODOL 350 MG TABLET: 350 | 23 days supply | Qty: 90 | Fill #2

## 2016-02-19 NOTE — Telephone Encounter (Signed)
Pt notified to continue to hold medicine until she sees Dr Alvy Bimler. Will have MRI on 8/14 and see Dr Alvy Bimler on 8/21.

## 2016-02-19 NOTE — Telephone Encounter (Signed)
I'm happy she is better Hold off all chemo until I see her on 8/14

## 2016-02-19 NOTE — Telephone Encounter (Signed)
Pt called to see how Dr Alvy Bimler wants to proceed. States she is feeling "much, much, much better, being off medicine has made a world of difference"

## 2016-02-19 NOTE — Telephone Encounter (Signed)
spoke w/ pt confirmed 8/21 apts

## 2016-02-24 ENCOUNTER — Ambulatory Visit (INDEPENDENT_AMBULATORY_CARE_PROVIDER_SITE_OTHER): Payer: Medicaid Other | Admitting: Neurology

## 2016-02-24 ENCOUNTER — Encounter: Payer: Self-pay | Admitting: Neurology

## 2016-02-24 ENCOUNTER — Telehealth: Payer: Self-pay | Admitting: *Deleted

## 2016-02-24 VITALS — BP 110/80 | HR 118 | Ht 69.0 in | Wt 153.0 lb

## 2016-02-24 DIAGNOSIS — C719 Malignant neoplasm of brain, unspecified: Secondary | ICD-10-CM

## 2016-02-24 DIAGNOSIS — G40109 Localization-related (focal) (partial) symptomatic epilepsy and epileptic syndromes with simple partial seizures, not intractable, without status epilepticus: Secondary | ICD-10-CM

## 2016-02-24 MED ORDER — DIVALPROEX SODIUM ER 250 MG PO TB24: ORAL_TABLET | ORAL | 3 refills | Status: DC

## 2016-02-24 MED ORDER — GABAPENTIN 300 MG PO CAPS: ORAL_CAPSULE | ORAL | 3 refills | Status: DC

## 2016-02-24 MED FILL — DIVALPROEX SOD ER 250 MG TA: 250 | 90 days supply | Qty: 360 | Fill #0

## 2016-02-24 NOTE — Telephone Encounter (Signed)
VM from pt asking about her MRIs.  They have not been scheduled yet.  I sent messages to Gaspar Bidding and Otilio Carpen in managed care requesting P.A. Be done so MRIs can be scheduled.  Pt also asks if she can change her appt w/ Dr. Alvy Bimler and chemo from 8/21 to 8/23?

## 2016-02-24 NOTE — Patient Instructions (Signed)
1. Continue all your medications 2. Proceed with Neuropsychological evaluation 3. As per [place] driving laws, no driving until 6 months seizure-free 4. Follow-up in 3 months  Seizure Precautions: 1. If medication has been prescribed for you to prevent seizures, take it exactly as directed.  Do not stop taking the medicine without talking to your doctor first, even if you have not had a seizure in a long time.   2. Avoid activities in which a seizure would cause danger to yourself or to others.  Don't operate dangerous machinery, swim alone, or climb in high or dangerous places, such as on ladders, roofs, or girders.  Do not drive unless your doctor says you may.  3. If you have any warning that you may have a seizure, lay down in a safe place where you can't hurt yourself.    4.  No driving for 6 months from last seizure, as per [facility].   Please refer to the following link on the [place] website for more information: http://[email or web address]/[email or web address]/driving/drivingu.cfm   5.  Maintain good sleep hygiene. Avoid alcohol  6.  Notify your neurology if you are planning pregnancy or if you become pregnant.  7.  Contact your doctor if you have any problems that may be related to the medicine you are taking.  8.  Call 911 and bring the patient back to the ED if:        A.  The seizure lasts longer than 5 minutes.       B.  The patient doesn't awaken shortly after the seizure  C.  The patient has new problems such as difficulty seeing, speaking or moving  D.  The patient was injured during the seizure  E.  The patient has a temperature over 102 F (39C)  F.  The patient vomited and now is having trouble breathing

## 2016-02-24 NOTE — Telephone Encounter (Signed)
PLease make a note for Victoria Bates to follow next week If MRI is not done on time we have to definitely move everything

## 2016-02-24 NOTE — Progress Notes (Signed)
NEUROLOGY FOLLOW UP OFFICE NOTE  Victoria Bates [id number]  HISTORY OF PRESENT ILLNESS: I had the pleasure of seeing Victoria Bates in follow-up in the neurology clinic on 02/25/2016.  The patient was last seen a year ago for seizures. She has a history of a right frontal oligodendroglioma s/p subtotal resection. Since her last visit, she has been to [facility] for second opinions regarding her brain tumor. She had started chemotherapy, and reports side effects on Procarbazine. She feels she has leveled out on her current seizure medications, currently taking Depakote ER 541m BID and Gabapentin 9076mBID. She denies any side effects on these doses. She had been started on Ativan which she did really well with, until she started chemotherapy. She reports 2 bad seizures in the past 2-3 months. She does not recall the circumstances of the first one, but reports it was a convulsion. The second seizure occurred in June during her sister-in-law's wedding. It started with tremors, then she does not recall much of what happened. She apparently was told by her husband to sit down, which she did, then she told him she was nauseated but does not recall doing this. She started convulsing and vomiting, telling people she could not breathe. Her husband told her she was in and out and crying in between. She took 2 doses of Ativan. She has patchy recollection of events that day, she does not recall leaving the house or her sister-in-law coming that night to check on her. She vaguely recalls the next day and took 3 days to recuperate. She denies any other bigger seizures, saying she has had "small stuff" affecting her speech or with tremors. One of her main complaints today is her memory. She has been referred for Neuropsychological testing, which I agree with. She wonders if a stimulant will help her fatigue.   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 [facility] due to left vision changes and left facial heaviness. She reports that she had a 60-lb unintentional weight loss 4 months leading up to her admission, she was vomiting after every meal. She started having episodes where her hands would have low amplitude tremors and she feels the right lower face twitching. She would need to pause and take a minute to get words out. If she did not stop what she was doing, the symptoms could last for hours. She would feel like her body was "going 90 mph" with palpitations up to 200 bpm. She was started on Xanax for anxiety, but this did not help. She then started having vision changes on her left eye, she would park crookedly when she thought she was going straight. She saw an ophthalmologist, eye exam was normal but left optic neuritis was questioned and brain imaging recommended. She then started to have left facial heaviness and numbness on the left face, arm, and leg, [facility]. She underwent subtotal resection on 01/23/14 by Dr. CaChristella Noand was started on Keppra for seizure prophylaxis at that time. She continued to have the recurrent episodes of tremors in both arms, palpitations, and right facial tightness, as well as episodes of left face, neck, shoulder, and arm numbness, going down the left thumb and index finger. She describes her major seizures as "all of that  times 100." After the episodes, she would be tired for a few days, wanting to sleep, with no energy. She has noticed that her short term memory is "gone." Keppra dose had been increased up to 2 grams daily, and this had caused a change in personality. She had seen neurologist Dr. Leonie Man, who switched her to Depakote. She tapered off Keppra and is currently on Depakote ER 544m BID with significant improvement in symptoms. She denies any  side effects to the Depakote.   She has a history of recurrent headaches since her teenage years. Headaches are usually over the right temporal and occipital region, with sharp stabbing and throbbing pain, with a knot in her left neck region. There is occasional nausea, photo, and phonophobia. She was having these headaches around 1-2 times a week when younger, however since December 2014, headaches have been occurring around 2-3 times a week. She had been receiving injections in her neck for this. If she takes medication at the onset, they last a few hours, however they may last up to several days. She has found that taking Decadron for a couple of days has been the only helpful medication. She takes Fioricet rarely. Zomig, aspirin, and Tylenol were ineffective in the past. Her father and 136year old son have headaches. She denies any diplopia, dysarthria, dysphagia, bowel/bladder dysfunction. She has chronic back pain and had been taking Soma qid for several years, with worsening of pain when she tried reducing this. She was started on Gabapentin for the seizures and back pain, she is unsure if this helps. She had been taking Ultram for the pain, and once stopping this, she noticed an improvement in the seizures as well.   Diagnostic Data: [facility] which is likely from breach artifact from prior craniotomy. There is intermittent right frontal sharp activity but no definite epileptiform activity. Impression: Mild right hemispheric focal cortical irritability but no definitive epileptiform activity is noted.  Her 48-hour EEG did not show any epileptiform discharges. It showed breach artifact consistent with prior surgery. She reported numerous symptoms including headache, severe muscle spasm under the base of her skull on the right side, tremors in both hands, jerking and pause in jaw (right side), left leg/inner thigh and left  foot warm and pigmented, eyes twitching, fatigue, with no associated epileptiform correlate seen. [facility]: Depakote and Gabapentin were held. Typical events were not clearly captured, however she reported 2 spells where she reported dizziness and visual changes (tiles on floor looked uneven) with no EEG correlate. Baseline EEG showed breach rhythm over the right hemisphere, no epileptiform discharges or electrographic seizures.  I personally reviewed MRI brain with and without contrast done 03/13/14 which showed post-op right-sided craniotomy. The surgical cavity in the right frontal lobe is filled with fluid, with note of improvement in layering blood products since prior study. The study of 01/24/2014 showed restricted diffusion deep to the tumor. This is consistent with acute infarct which is resolving at this time. No acute infarct is present on today's study. No evidence of recurrent or residual tumor although the original tumor did not enhance. No mass effect or edema is present that would suggest recurrent tumor. Small amount of subarachnoid hemorrhage on the right again noted. MRI 11/2015 which I personally reviewed, there is similar pattern of enhancement and abnormal FLAIR signal in the right frontal gyrus immediately  superior to the resection, stable from previous scan.   Epilepsy Risk Factors: Right frontal oligodendroglioma s/p subtotal resection. Otherwise she had a normal birth and early development. There is no history of febrile convulsions, CNS infections such as meningitis/encephalitis, or family history of seizures.  PAST MEDICAL HISTORY: Past Medical History:  Diagnosis Date  . Anemia   . Anxiety   . Asthma    very mild- no inhaler use since July 2013  . Brain tumor ([place])    Hx: of  . Cancer ([place]) 2003   skin  . Chronic fatigue 10/03/2014  . Cognitive impairment 08/13/2015  . Complication of anesthesia    takes  longer for patient to go under anesthesia  . Dizziness   . Dysphasia 02/12/2014  . Glioma, malignant ([place]) dx'd 09/2013  . Headache 02/12/2014  . Headache(784.0)    migraines  . Herniated disc   . Hot flashes 10/03/2014  . Infection    to right head incision  . 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 Liver hemangioma   . Liver lesion 08/22/2014  . Nausea alone 02/12/2014  . Neuropathic pain 04/08/2014  . Petit mal seizure status ([place])   . Seizures ([place])    petit mal serizures most days    MEDICATIONS: Current Outpatient Prescriptions on File Prior to Visit  Medication Sig Dispense Refill  . albuterol (PROVENTIL HFA;VENTOLIN HFA) 108 (90 BASE) MCG/ACT inhaler Inhale 2 puffs into the lungs every 6 (six) hours as needed for wheezing or shortness of breath. Reported on 08/11/2015    . butalbital-acetaminophen-caffeine (FIORICET, ESGIC) 50-325-40 MG tablet TAKE 1 TABLET BY MOUTH EVERY 6 HOURS AS NEEDED FOR HEADACE. 90 tablet 0  . carisoprodol (SOMA) 350 MG tablet Take 1 tablet (350 mg total) by mouth 4 (four) times daily. For seizure prevention 100 tablet 0  . cetirizine (ZYRTEC) 10 MG tablet Take 10 mg by mouth daily.    . diphenoxylate-atropine (LOMOTIL) 2.5-0.025 MG tablet Take 1 tablet by mouth 4 (four) times daily as needed for diarrhea or loose stools. 30 tablet 0  . divalproex (DEPAKOTE ER) 250 MG 24 hr tablet TAKE 2 TABLETS BY MOUTH IN THE MORNING AND 2 TABLETS EVERY EVENING 450 tablet 1  . famotidine (PEPCID) 20 MG tablet One at bedtime 30 tablet 2  . gabapentin (NEURONTIN) 300 MG capsule TAKE 3 CAPSULES BY MOUTH IN THE MORNING AND 3 CAPSULES IN THE EVENING 540 capsule 1  . GLEOSTINE 100 MG capsule TAKE 2 CAPSULES BY MOUTH ONCE. TAKE ON AN EMTPY STOMACH 1 HOUR BEFORE OR 2 HOURS AFTER MEALS. CAUTION:CHEMOTHERAPY 2 capsule 0  . HYDROcodone-acetaminophen (NORCO) 10-325 MG tablet Take 1 tablet by mouth every 6 (six) hours as needed. 90 tablet 0  . levalbuterol (XOPENEX HFA) 45 MCG/ACT inhaler Inhale  1 puff into the lungs every 4 (four) hours as needed for wheezing. 1 Inhaler 12  . lidocaine-prilocaine (EMLA) cream Apply 1 application topically as needed. 30 g 6  . LORazepam (ATIVAN) 0.5 MG tablet Take 1 tablet (0.5 mg total) by mouth every 8 (eight) hours as needed for anxiety or seizure. 90 tablet 2  . morphine (MSIR) 15 MG tablet Take 1 tablet (15 mg total) by mouth every 6 (six) hours as needed for severe pain. 90 tablet 0  . ondansetron (ZOFRAN ODT) 8 MG disintegrating tablet Take 1 tablet (8 mg total) by mouth every 8 (eight) hours as needed for nausea or vomiting. 90 tablet 3  . pantoprazole (PROTONIX) 40 MG tablet Take 1  tablet (40 mg total) by mouth daily. Take 30-60 min before first meal of the day 30 tablet 2  . PRESCRIPTION MEDICATION Chemotherapy regimen. RCC    . procarbazine (MATULANE) 50 MG capsule Take 2 capsules (100 mg total) by mouth daily. Follow low tyramine diet. Take for 14 days starting on Day 8. Repeat every 6 weeks 28 capsule 12  . promethazine (PHENERGAN) 25 MG tablet Take 25 mg by mouth every 6 (six) hours as needed for nausea or vomiting. Reported on 09/21/2015    . vinCRIStine 2 mg in sodium chloride 0.9 % 50 mL as directed.     No current facility-administered medications on file prior to visit.     ALLERGIES: Allergies  Allergen Reactions  . Adhesive [Tape] Other (See Comments)    Skin tear  . Latex Hives  . Percocet [Oxycodone-Acetaminophen] Other (See Comments)    makes pt angry  . Shellfish Allergy Diarrhea and Nausea And Vomiting  . Skin Adhesives     Per patient states reaction to dermabond when had hysterectomy. Stated was red and irritated.   Manuela Neptune [Carisoprodol]     Watson brand, specifically, causes disorientation (can take other brands)  . Tramadol Hcl Other (See Comments)    Potentially worsen seizure control  . Vancomycin Hives    FAMILY HISTORY: Family History  Problem Relation Age of Onset  . Hypertension Mother   . Skin cancer  Mother   . Thyroid cancer Father   . Skin cancer Father   . Breast cancer Maternal Aunt     dx in her 94s  . Prostate cancer Maternal Uncle 63  . Melanoma Maternal Uncle   . Ovarian cancer Maternal Grandmother     dx in her late 16s-40s  . Breast cancer Maternal Aunt     bilateral breast cancer dx in 51s and 63s; possibly BRCA+  . Other Son     multiple CAL spots, bright spots on brain MRI, ? chiari malformation    SOCIAL HISTORY: Social History   Social History  . Marital status: Single    Spouse name: N/A  . Number of children: 2  . Years of education: College   Occupational History  . laser [employer]   Social History Main Topics  . Smoking status: Never Smoker  . Smokeless tobacco: Never Used  . Alcohol use No  . Drug use: No  . Sexual activity: Yes    Birth control/ protection: None   Other Topics Concern  . Not on file   Social History Narrative   Patient lives at home with her two children.   Caffeine use: minimum of 4 cups daily    REVIEW OF SYSTEMS: Constitutional: No fevers, chills, or sweats, + generalized fatigue, change in appetite Eyes: No visual changes, double vision, eye pain Ear, nose and throat: No hearing loss, ear pain, nasal congestion, sore throat Cardiovascular: No chest pain, palpitations Respiratory:  No shortness of breath at rest or with exertion, wheezes GastrointestinaI: No nausea, vomiting, diarrhea, abdominal pain, fecal incontinence Genitourinary:  No dysuria, urinary retention or frequency Musculoskeletal:  + neck pain, back pain Integumentary: No rash, pruritus, skin lesions Neurological: as above Psychiatric: No depression, insomnia, +anxiety Endocrine: No palpitations, fatigue, diaphoresis, mood swings, change in appetite, change in weight, increased thirst Hematologic/Lymphatic:  No anemia, purpura, petechiae. Allergic/Immunologic: no itchy/runny eyes, nasal congestion, recent allergic reactions,  rashes  PHYSICAL EXAM: Vitals:   02/24/16 1554  BP: 110/80  Pulse: (!) 118  General: No acute distress Head:  Normocephalic/atraumatic Neck: supple, no paraspinal tenderness, full range of motion Heart:  Regular rate and rhythm Lungs:  Clear to auscultation bilaterally Back: No paraspinal tenderness Skin/Extremities: No rash, no edema Neurological Exam: alert and oriented to person, place, and time. No aphasia or dysarthria. Fund of knowledge is appropriate.  Recent and remote memory are intact.  Attention and concentration are normal.   Cranial nerves: Pupils equal, round. No facial asymmetry. Tongue, uvula, palate midline.  Motor: moves all extremities symmetrically. Gait narrow-based and steady.  IMPRESSION: This is a 35 yo RH woman with a history of grade 2 oligodengroglioma s/p subtotal resection on chemotherapy, anxiety, headaches, with recurrent episodes of low amplitude tremors, palpitations, and right facial tightening, as well as left face and arm numbness. She had a 48-hour EEG which did not show any epileptiform discharges. Her 3-day video EEG monitoring interictally showed breach artifact, otherwise no epileptiform discharges. She did not have typical events and hence these could not be commented on. She has not been seen in a year and reports that she is feeling good on her current AED regimen of Depakote ER 567m BID and Gabapentin 9018mBID. She reports 2 convulsions in May/June, the description does raise the possibility of non-epileptic events, however she has risk factors for epilepsy. Continue current doses for now. She has prn Ativan to take for rescue, and knows to minimize this to avoid dependence/tolerance. We discussed memory issues, I agree with a Neuropsychological evaluation, particularly if she is planning for surgery to establish a baseline pre-op. If they deem she is a candidate for a stimulant, she will be referred to psychiatry for this. She will follow-up in 3  months. She is aware of [place] driving laws to stop driving after an episode of loss of consciousness/awareness until 6 months event-free.   Thank you for allowing me to participate in her care.  Please do not hesitate to call for any questions or concerns.  The duration of this appointment visit was 25 minutes of face-to-face time with the patient.  Greater than 50% of this time was spent in counseling, explanation of diagnosis, planning of further management, and coordination of care.   KaEllouise NewerM.D.   CC: Dr. GrLaurann MontanaDr. GoAlvy Bimler

## 2016-02-25 ENCOUNTER — Encounter: Payer: Self-pay | Admitting: Hematology and Oncology

## 2016-02-25 ENCOUNTER — Encounter: Payer: Self-pay | Admitting: Neurology

## 2016-02-26 ENCOUNTER — Other Ambulatory Visit: Payer: Self-pay | Admitting: *Deleted

## 2016-02-26 ENCOUNTER — Ambulatory Visit: Payer: Medicaid Other | Admitting: Internal Medicine

## 2016-02-26 ENCOUNTER — Ambulatory Visit: Payer: Medicaid Other | Admitting: Allergy

## 2016-02-29 ENCOUNTER — Ambulatory Visit: Payer: Medicaid Other | Admitting: Hematology and Oncology

## 2016-02-29 ENCOUNTER — Other Ambulatory Visit: Payer: Medicaid Other

## 2016-02-29 ENCOUNTER — Other Ambulatory Visit: Payer: Self-pay | Admitting: Hematology and Oncology

## 2016-02-29 ENCOUNTER — Other Ambulatory Visit: Payer: Self-pay | Admitting: *Deleted

## 2016-02-29 ENCOUNTER — Ambulatory Visit: Payer: Medicaid Other

## 2016-02-29 ENCOUNTER — Telehealth: Payer: Self-pay | Admitting: *Deleted

## 2016-02-29 MED ORDER — LORAZEPAM 0.5 MG PO TABS: 0.5000 mg | ORAL_TABLET | Freq: Three times a day (TID) | ORAL | 2 refills | Status: DC | PRN

## 2016-02-29 MED ORDER — HYDROCODONE-ACETAMINOPHEN 10-325 MG PO TABS: 1.0000 | ORAL_TABLET | Freq: Four times a day (QID) | ORAL | 0 refills | Status: DC | PRN

## 2016-02-29 MED FILL — HYDROCODON-APAP 10-325: 10-325 | 22 days supply | Qty: 90 | Fill #0

## 2016-02-29 MED FILL — LIDOCAINE-PRILOCAINE CREAM: 2.5-2.5 | 10 days supply | Qty: 30 | Fill #2

## 2016-02-29 NOTE — Telephone Encounter (Signed)
Victoria Bates, is this an automated response or patient request Please double check I will not refill electronically

## 2016-02-29 NOTE — Telephone Encounter (Signed)
I can see her at 1230 pm on 8/23. Best I talk to her first and review scans before scheduling any labs/chemo So appt only. If OK, please send msg to scheduler

## 2016-02-29 NOTE — Telephone Encounter (Signed)
Pt has MRI scheduled for 8/16 @ 0630 prior to leaving for cruise.  Would like to schedule follow up with you on 8/23 when she returns from trip.    When do you want lab appt? Prior to MRI or office visit?

## 2016-02-29 NOTE — Telephone Encounter (Signed)
Pt will see Dr Alvy Bimler on 8/23 @ 1230. Note sent to scheduler

## 2016-03-01 ENCOUNTER — Other Ambulatory Visit: Payer: Self-pay

## 2016-03-01 ENCOUNTER — Ambulatory Visit: Payer: Medicaid Other | Admitting: Allergy and Immunology

## 2016-03-01 ENCOUNTER — Encounter: Payer: Self-pay | Admitting: Allergy and Immunology

## 2016-03-01 ENCOUNTER — Ambulatory Visit (INDEPENDENT_AMBULATORY_CARE_PROVIDER_SITE_OTHER): Payer: Medicaid Other | Admitting: Allergy and Immunology

## 2016-03-01 ENCOUNTER — Other Ambulatory Visit: Payer: Self-pay | Admitting: *Deleted

## 2016-03-01 VITALS — BP 110/76 | HR 89 | Temp 98.0°F | Resp 10 | Ht 67.2 in | Wt 149.0 lb

## 2016-03-01 DIAGNOSIS — J4520 Mild intermittent asthma, uncomplicated: Secondary | ICD-10-CM

## 2016-03-01 DIAGNOSIS — T887XXD Unspecified adverse effect of drug or medicament, subsequent encounter: Secondary | ICD-10-CM

## 2016-03-01 DIAGNOSIS — T50905D Adverse effect of unspecified drugs, medicaments and biological substances, subsequent encounter: Secondary | ICD-10-CM

## 2016-03-01 DIAGNOSIS — T50905A Adverse effect of unspecified drugs, medicaments and biological substances, initial encounter: Secondary | ICD-10-CM | POA: Insufficient documentation

## 2016-03-01 MED ORDER — LEVOCETIRIZINE DIHYDROCHLORIDE 5 MG PO TABS: 5.0000 mg | ORAL_TABLET | Freq: Every evening | ORAL | 5 refills | Status: DC

## 2016-03-01 MED ORDER — RANITIDINE HCL 150 MG PO TABS: 150.0000 mg | ORAL_TABLET | Freq: Two times a day (BID) | ORAL | 5 refills | Status: DC

## 2016-03-01 MED ORDER — BUTALBITAL-APAP-CAFFEINE 50-325-40 MG PO TABS: ORAL_TABLET | ORAL | 0 refills | Status: DC

## 2016-03-01 MED ORDER — PREDNISONE 10 MG PO TABS: 10.0000 mg | ORAL_TABLET | ORAL | 0 refills | Status: DC | PRN

## 2016-03-01 MED ORDER — MONTELUKAST SODIUM 10 MG PO TABS: 10.0000 mg | ORAL_TABLET | Freq: Every day | ORAL | 5 refills | Status: DC

## 2016-03-01 MED FILL — BUTALB-ACETAMIN-CAFF 50-325: 50-325-40 | 22 days supply | Qty: 90 | Fill #0

## 2016-03-01 NOTE — Progress Notes (Signed)
New Patient Note  RE: GENESYS COGGESHALL MRN: [id number] DOB: 08-26-1980 Date of Office Visit: 03/01/2016  Referring provider: Heath Lark, MD Primary care provider: [name], MD  Chief Complaint: Medication Reaction and Asthma   History of present illness: Victoria Bates is a 35 y.o. female presenting today for consultation of possible drug reaction.  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 She has been on 4 rounds of chemotherapy since February 2017.  Each course of chemotherapy consists of gleostine followed a week later by IV vincristine as well as a two-week course of procarbazine.  Ten days later she takes another course of intravenous vincristine.  During the first round of chemotherapy she developed increased asthma symptoms while taking the procarbazine.  She has a history of very mild asthma which is typically triggered only by very cold weather.  As the round of chemotherapy was in February and March, she initially believed that her lower respiratory symptoms may have been secondary to the cold weather.  However, during the third round of chemotherapy in late May, she developed asthma symptoms while taking the procarbazine and 3 days after discontinuing the procarbazine had a severe asthma exacerbation as well as syncopal episodes requiring emergency department evaluation and treatment.  During her fourth round of chemotherapy, on her ninth day of procarbazine she developed a erythematous, vesicular rash on her neck and face, facial swelling, throat tightness, and chest tightness.  She states that her "body felt like it was on fire."  She went to the emergency department for evaluation and treatment.  All chemotherapy agents were discontinued at that time pending further evaluation. She is concerned because she believes that the chemotherapy regimen is the only that has been effective. She is scheduled for an MRI tomorrow.   Assessment and plan: Adverse drug  reaction Arhianna's history suggests adverse drug reactions. While her symptom constellation may have been related to gleostine, vincristine, or procarbazine, the history suggests that procarbazine is the most likely culprit. Skin testing to medications is generally unreliable, with the exception of penicillin, due to poor sensitivity and specificity.    If possible gleostine, vincristine, or procarbazine should be avoided.  If continued avoidance of these medications is not an option and if no suitable alternatives are available then prophylactic treatment as follows is recommended 3 days prior to and throughout the chemotherapeutic regimen: Levocetirizine 5 mg twice a day. Ranitidine 150 mg twice a day. Montelukast 10 mg daily. Prednisone 10 mg daily.  Laelah should have access to diphenhydramine and albuterol HFA in case of breakthrough symptoms.  For symptoms concerning for anaphylaxis, epinephrine is to be administered and 911 called immediately.   Asthma Spirometry today revealed mild restrictive pattern.  We were unable to perform postbronchodilator spirometry today.  Continue albuterol HFA, 1-2 inhalations every 4-6 hours as needed.  During rounds of chemotherapy, she will take a prophylactic regimen including prednisone 10 mg daily and montelukast 10 mg daily bedtime.  Subjective and objective measures of pulmonary function will be followed and the treatment plan will be adjusted accordingly.   Meds ordered this encounter  Medications  . levocetirizine (XYZAL) 5 MG tablet    Sig: Take 1 tablet (5 mg total) by mouth every evening.    Dispense:  60 tablet    Refill:  5  . ranitidine (ZANTAC) 150 MG tablet    Sig: Take 1 tablet (150 mg total) by mouth 2 (two) times daily.    Dispense:  60 tablet    Refill:  5  . montelukast (SINGULAIR) 10 MG tablet    Sig: Take 1 tablet (10 mg total) by mouth at bedtime.    Dispense:  30 tablet    Refill:  5  . predniSONE (DELTASONE) 10 MG  tablet    Sig: Take 1 tablet (10 mg total) by mouth as needed.    Dispense:  30 tablet    Refill:  0       Diagnostics: Spirometry reveals an FVC of 2.79 L (74% predicted) and an FEV1 of 2.37 L (75% predicted).  Mild restrictive pattern.  Please see scanned spirometry results for details.  Physical examination: Blood pressure 110/76, pulse 89, temperature 98 F (36.7 C), temperature source Oral, resp. rate 10, height 5' 7.2" (1.707 m), weight 149 lb (67.6 kg), last menstrual period 09/20/2012.  General: Alert, interactive, in no acute distress. Neck: Supple without lymphadenopathy. Lungs: Mildly decreased breath sounds bilaterally without wheezing, rhonchi or rales. CV: Normal S1, S2 without murmurs. Abdomen: Nondistended, nontender. Skin: Warm and dry, without lesions or rashes. Extremities:  No clubbing, cyanosis or edema. Neuro:   Grossly intact.  Review of systems:  Review of systems negative except as noted in HPI / PMHx or noted below: Review of Systems  Constitutional: Negative.   HENT: Negative.   Eyes: Negative.   Respiratory: Negative.   Cardiovascular: Negative.   Gastrointestinal: Negative.   Genitourinary: Negative.   Musculoskeletal: Negative.   Skin: Negative.   Neurological: Negative.   Endo/Heme/Allergies: Negative.   Psychiatric/Behavioral: Negative.     Past medical history:  Past Medical History:  Diagnosis Date  . Anemia   . Anxiety   . Asthma    very mild- no inhaler use since July 2013  . Brain tumor ([place])    Hx: of  . Cancer ([place]) 2003   skin  . Chronic fatigue 10/03/2014  . Cognitive impairment 08/13/2015  . Complication of anesthesia    takes longer for patient to go under anesthesia  . Dizziness   . Dysphasia 02/12/2014  . Glioma, malignant ([place]) dx'd 09/2013  . Headache 02/12/2014  . Headache(784.0)    migraines  . Herniated disc   . Hot flashes 10/03/2014  . Infection    to right head incision  . 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  Liver hemangioma   . Liver lesion 08/22/2014  . Nausea alone 02/12/2014  . Neuropathic pain 04/08/2014  . Petit mal seizure status ([place])   . Seizures ([place])    petit mal serizures most days    Past surgical history:  Past Surgical History:  Procedure Laterality Date  . ABDOMINAL HYSTERECTOMY    . CESAREAN SECTION    . COLONOSCOPY WITH PROPOFOL N/A 08/29/2012   Procedure: COLONOSCOPY WITH PROPOFOL;  Surgeon: Arta Silence, MD;  Location: [facility];  Service: Endoscopy;  Laterality: N/A;  . coposcopy  08/06/2012   cervical biopsy  . CRANIOTOMY Right 09/26/2013   Procedure: Awake Right frontal open brain biopsy;  Surgeon: Winfield Cunas, MD;  Location: [facility];  Service: Neurosurgery;  Laterality: Right;  awake Right frontal open brain biopsy  . CRANIOTOMY N/A 10/30/2013   Procedure: debridement of cranial wound  right temporal incision, ;  Surgeon: Winfield Cunas, MD;  Location: [facility];  Service: Neurosurgery;  Laterality: N/A;  . CRANIOTOMY N/A 01/23/2014   Procedure: Awake Craniotomy with BrainLab;  Surgeon: Winfield Cunas, MD;  Location: [facility];  Service: Neurosurgery;  Laterality: N/A;  Awake Craniotomy  tumor with BrainLab  . ESOPHAGOGASTRODUODENOSCOPY (EGD) WITH PROPOFOL N/A 08/29/2012   Procedure: ESOPHAGOGASTRODUODENOSCOPY (EGD) WITH PROPOFOL;  Surgeon: Arta Silence, MD;  Location: [facility];  Service: Endoscopy;  Laterality: N/A;  . left knee surgery  2000   torn meniscus  . right flank surgery  2005   bnign mass from flank area-right  . ROBOTIC ASSISTED TOTAL HYSTERECTOMY WITH BILATERAL SALPINGO OOPHERECTOMY Bilateral 09/25/2012   Procedure: ROBOTIC ASSISTED TOTAL HYSTERECTOMY WITH BILATERAL SALPINGO OOPHORECTOMY;  Surgeon: Maeola Sarah. Landry Mellow, MD;  Location: [facility];  Service: Gynecology;  Laterality: Bilateral;  . ROTATOR CUFF REPAIR    . WISDOM TOOTH EXTRACTION      Family history: Family History  Problem Relation Age of Onset  . Hypertension Mother   . Skin cancer  Mother   . Thyroid cancer Father   . Skin cancer Father   . Breast cancer Maternal Aunt     dx in her 68s  . Prostate cancer Maternal Uncle 63  . Melanoma Maternal Uncle   . Ovarian cancer Maternal Grandmother     dx in her late 41s-40s  . Breast cancer Maternal Aunt     bilateral breast cancer dx in 103s and 19s; possibly BRCA+  . Other Son     multiple CAL spots, bright spots on brain MRI, ? chiari malformation    Social history: Social History   Social History  . Marital status: Single    Spouse name: N/A  . Number of children: 2  . Years of education: College   Occupational History  . laser [employer]   Social History Main Topics  . Smoking status: Never Smoker  . Smokeless tobacco: Never Used  . Alcohol use No  . Drug use: No  . Sexual activity: Yes    Birth control/ protection: None   Other Topics Concern  . Not on file   Social History Narrative   Patient lives at home with her two children.   Caffeine use: minimum of 4 cups daily   Environmental History: Patient lives in a 34 year old house with hardwood floors throughout and central air/heat.  There is a Dog in house, the dog has access to her bedroom.  She is a nonsmoker.    Medication List       Accurate as of 03/01/16  6:55 PM. Always use your most recent med list.          albuterol 108 (90 Base) MCG/ACT inhaler Commonly known as:  PROVENTIL HFA;VENTOLIN HFA Inhale 2 puffs into the lungs every 6 (six) hours as needed for wheezing or shortness of breath. Reported on 08/11/2015   butalbital-acetaminophen-caffeine 50-325-40 MG tablet Commonly known as:  FIORICET, ESGIC TAKE 1 TABLET BY MOUTH EVERY 6 HOURS AS NEEDED FOR HEADACE.   carisoprodol 350 MG tablet Commonly known as:  SOMA Take 1 tablet (350 mg total) by mouth 4 (four) times daily. For seizure prevention   cetirizine 10 MG tablet Commonly known as:  ZYRTEC Take 10 mg by mouth daily.   diphenoxylate-atropine 2.5-0.025 MG  tablet Commonly known as:  LOMOTIL Take 1 tablet by mouth 4 (four) times daily as needed for diarrhea or loose stools.   divalproex 250 MG 24 hr tablet Commonly known as:  DEPAKOTE ER Take 2 tablets twice a day   famotidine 20 MG tablet Commonly known as:  PEPCID One at bedtime   gabapentin 300 MG capsule Commonly known as:  NEURONTIN TAKE 3 CAPSULES  BY MOUTH IN THE MORNING AND 3 CAPSULES IN THE EVENING   GLEOSTINE 100 MG capsule Generic drug:  lomustine TAKE 2 CAPSULES BY MOUTH ONCE. TAKE ON AN EMTPY STOMACH 1 HOUR BEFORE OR 2 HOURS AFTER MEALS. CAUTION:CHEMOTHERAPY   HYDROcodone-acetaminophen 10-325 MG tablet Commonly known as:  NORCO Take 1 tablet by mouth every 6 (six) hours as needed.   levalbuterol 45 MCG/ACT inhaler Commonly known as:  XOPENEX HFA Inhale 1 puff into the lungs every 4 (four) hours as needed for wheezing.   levocetirizine 5 MG tablet Commonly known as:  XYZAL Take 1 tablet (5 mg total) by mouth every evening.   lidocaine-prilocaine cream Commonly known as:  EMLA Apply 1 application topically as needed.   LORazepam 0.5 MG tablet Commonly known as:  ATIVAN Take 1 tablet (0.5 mg total) by mouth every 8 (eight) hours as needed for anxiety or seizure.   montelukast 10 MG tablet Commonly known as:  SINGULAIR Take 1 tablet (10 mg total) by mouth at bedtime.   morphine 15 MG tablet Commonly known as:  MSIR Take 1 tablet (15 mg total) by mouth every 6 (six) hours as needed for severe pain.   ondansetron 8 MG disintegrating tablet Commonly known as:  ZOFRAN ODT Take 1 tablet (8 mg total) by mouth every 8 (eight) hours as needed for nausea or vomiting.   pantoprazole 40 MG tablet Commonly known as:  PROTONIX Take 1 tablet (40 mg total) by mouth daily. Take 30-60 min before first meal of the day   predniSONE 10 MG tablet Commonly known as:  DELTASONE Take 1 tablet (10 mg total) by mouth as needed.   PRESCRIPTION MEDICATION Chemotherapy regimen.  RCC   procarbazine 50 MG capsule Commonly known as:  MATULANE Take 2 capsules (100 mg total) by mouth daily. Follow low tyramine diet. Take for 14 days starting on Day 8. Repeat every 6 weeks   promethazine 25 MG tablet Commonly known as:  PHENERGAN Take 25 mg by mouth every 6 (six) hours as needed for nausea or vomiting. Reported on 09/21/2015   ranitidine 150 MG tablet Commonly known as:  ZANTAC Take 1 tablet (150 mg total) by mouth 2 (two) times daily.   vinCRIStine 2 mg in sodium chloride 0.9 % 50 mL as directed.       Known medication allergies: Allergies  Allergen Reactions  . Adhesive [Tape] Other (See Comments)    Skin tear  . Latex Hives  . Percocet [Oxycodone-Acetaminophen] Other (See Comments)    makes pt angry  . Shellfish Allergy Diarrhea and Nausea And Vomiting  . Skin Adhesives     Per patient states reaction to dermabond when had hysterectomy. Stated was red and irritated.   Manuela Neptune [Carisoprodol]     Watson brand, specifically, causes disorientation (can take other brands)  . Tramadol Hcl Other (See Comments)    Potentially worsen seizure control  . Vancomycin Hives    I appreciate the opportunity to take part in Jayah's care. Please do not hesitate to contact me with questions.  Sincerely,   R. Edgar Frisk, MD

## 2016-03-01 NOTE — Patient Instructions (Addendum)
Adverse drug reaction Arisbeth's history suggests adverse drug reactions. While her symptom constellation may have been related to gleostine, vincristine, or procarbazine, the history suggests that procarbazine is the most likely culprit. Skin testing to medications is generally unreliable, with the exception of penicillin, due to poor sensitivity and specificity.    If possible gleostine, vincristine, or procarbazine should be avoided.  If continued avoidance of these medications is not an option and if no suitable alternatives are available then prophylactic treatment as follows is recommended 3 days prior to and throughout the chemotherapeutic regimen: Levocetirizine 5 mg twice a day. Ranitidine 150 mg twice a day. Montelukast 10 mg daily. Prednisone 10 mg daily.  Mohogany should have access to diphenhydramine and albuterol HFA in case of breakthrough symptoms.  For symptoms concerning for anaphylaxis, epinephrine is to be administered and 911 called immediately.   Asthma Spirometry today revealed mild restrictive pattern.    Continue albuterol HFA, 1-2 inhalations every 4-6 hours as needed.  During rounds of chemotherapy, she will take a prophylactic regimen including prednisone 10 mg daily and montelukast 10 mg daily bedtime.  Subjective and objective measures of pulmonary function will be followed and the treatment plan will be adjusted accordingly.   Return if symptoms worsen or fail to improve.

## 2016-03-01 NOTE — Assessment & Plan Note (Addendum)
Brennley's history suggests adverse drug reactions. While her symptom constellation may have been related to gleostine, vincristine, or procarbazine, the history suggests that procarbazine is the most likely culprit. Skin testing to medications is generally unreliable, with the exception of penicillin, due to poor sensitivity and specificity.    If possible gleostine, vincristine, or procarbazine should be avoided.  If continued avoidance of these medications is not an option and if no suitable alternatives are available then prophylactic treatment as follows is recommended 3 days prior to and throughout the chemotherapeutic regimen: Levocetirizine 5 mg twice a day. Ranitidine 150 mg twice a day. Montelukast 10 mg daily. Prednisone 10 mg daily.  Jamella should have access to diphenhydramine and albuterol HFA in case of breakthrough symptoms.  For symptoms concerning for anaphylaxis, epinephrine is to be administered and 911 called immediately.

## 2016-03-01 NOTE — Assessment & Plan Note (Addendum)
Spirometry today revealed mild restrictive pattern.  We were unable to perform postbronchodilator spirometry today.  Continue albuterol HFA, 1-2 inhalations every 4-6 hours as needed.  During rounds of chemotherapy, she will take a prophylactic regimen including prednisone 10 mg daily and montelukast 10 mg daily bedtime.  Subjective and objective measures of pulmonary function will be followed and the treatment plan will be adjusted accordingly.

## 2016-03-02 ENCOUNTER — Ambulatory Visit (HOSPITAL_COMMUNITY)
Admission: RE | Admit: 2016-03-02 | Discharge: 2016-03-02 | Disposition: A | Discharge status: Home / Self Care | Payer: Medicaid Other | Source: Ambulatory Visit | Attending: Hematology and Oncology | Admitting: Hematology and Oncology

## 2016-03-02 ENCOUNTER — Telehealth: Payer: Self-pay | Admitting: *Deleted

## 2016-03-02 DIAGNOSIS — C711 Malignant neoplasm of frontal lobe: Secondary | ICD-10-CM | POA: Insufficient documentation

## 2016-03-02 DIAGNOSIS — Z9889 Other specified postprocedural states: Secondary | ICD-10-CM | POA: Insufficient documentation

## 2016-03-02 MED ORDER — GADOBENATE DIMEGLUMINE 529 MG/ML IV SOLN
15.0000 mL | Freq: Once | INTRAVENOUS | Status: AC | PRN
  Administered 2016-03-02: 14 mL via INTRAVENOUS

## 2016-03-02 MED FILL — MONTELUKAST SOD 10 MG TAB: 10 | 30 days supply | Qty: 30 | Fill #0

## 2016-03-02 NOTE — Telephone Encounter (Signed)
Pt notified of scan results. To see Dr Alvy Bimler 8/23 @ 1330. Message sent to scheduler

## 2016-03-04 MED FILL — LEVOCETIRIZINE 5 MG TABLET: 5 | 30 days supply | Qty: 30 | Fill #0

## 2016-03-07 ENCOUNTER — Ambulatory Visit: Payer: Medicaid Other | Admitting: Hematology and Oncology

## 2016-03-07 ENCOUNTER — Ambulatory Visit: Payer: Medicaid Other

## 2016-03-09 ENCOUNTER — Encounter: Payer: Self-pay | Admitting: Hematology and Oncology

## 2016-03-09 ENCOUNTER — Ambulatory Visit (HOSPITAL_BASED_OUTPATIENT_CLINIC_OR_DEPARTMENT_OTHER): Payer: Medicaid Other | Admitting: Hematology and Oncology

## 2016-03-09 ENCOUNTER — Telehealth: Payer: Self-pay | Admitting: Hematology and Oncology

## 2016-03-09 DIAGNOSIS — C711 Malignant neoplasm of frontal lobe: Secondary | ICD-10-CM | POA: Diagnosis not present

## 2016-03-09 DIAGNOSIS — M792 Neuralgia and neuritis, unspecified: Secondary | ICD-10-CM

## 2016-03-09 DIAGNOSIS — T50905D Adverse effect of unspecified drugs, medicaments and biological substances, subsequent encounter: Secondary | ICD-10-CM

## 2016-03-09 MED ORDER — MORPHINE SULFATE 15 MG PO TABS
15.0000 mg | ORAL_TABLET | Freq: Four times a day (QID) | ORAL | 0 refills | Status: DC | PRN
Start: 2016-03-09 — End: 2016-04-05

## 2016-03-09 MED FILL — MORPHINE SULFATE IR 15 MG T: 15 | 23 days supply | Qty: 90 | Fill #0

## 2016-03-09 MED FILL — LORazepam 0.5 MG TABS: 0.5 | 30 days supply | Qty: 90 | Fill #0

## 2016-03-09 MED FILL — CARISOPRODOL 350 MG TABLET: 350 | 23 days supply | Qty: 90 | Fill #3

## 2016-03-09 NOTE — Assessment & Plan Note (Addendum)
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  I refilled prescription for IR morphine

## 2016-03-09 NOTE — Assessment & Plan Note (Signed)
I reviewed her recent imaging study at the brain tumor board 2 days ago. Overall consensus is the patient has no residual malignancy. General recommendation is for observation only with repeat imaging study in 3 months. I would not recommend further systemic chemotherapy

## 2016-03-09 NOTE — Telephone Encounter (Signed)
AVS REPORT AND APPT SCHD GIVEN PER 08/23 LOS. °

## 2016-03-09 NOTE — Progress Notes (Signed)
[facility] OFFICE PROGRESS NOTE  Patient Care Team: Lavone Orn, MD as PCP - General (Internal Medicine) Ashok Pall, MD as Consulting Physician (Neurosurgery) Burnell Blanks. Salomon Fick, MD as Consulting Physician (Neurosurgery) Burnell Blanks. 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      09/27/2013 Pathology Results    Accession: [id number] pathology showed grade 2 oligodendroglioma with mutant IDH1 protein, ATRX, OLIG 2 expression. p53 is negative, low Ki-67 labeling index. 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      11/06/2013 Imaging    Repeat CT scan of the head showed resolution of abscess.      01/23/2014 Surgery    Dr. Christella Noa perform a subtotal gross resection of the tumor.      01/24/2014 Imaging    Repeat imaging studies showed persistent and residual disease.      03/03/2014 - 09/01/2014 Chemotherapy    She was started on Temodar, 5 days every 28 days      03/13/2014 Imaging    Repeat MRI showed no evidence of progression of disease.      07/14/2014 Imaging    Repeat MRI showed no evidence of disease progression.      09/12/2014 Imaging    MRI of the liver show mild regression of the size of lesions, presumed to be benign      09/15/2014 Imaging    Repeat MRI of the brain show persistent abnormalities, suspicious for residual disease      03/02/2015 Imaging     MRI brain at [facility] show persistent abnormalities      08/10/2015 Imaging    Repeat MRI [facility] showed stable disease      08/24/2015 - 02/08/2016 Chemotherapy    She was started on Lomustine, Procarbazine and Vincristine  chemotherapy      08/30/2015 Imaging    CT chest showed no evidence of aspiration pneumonia      11/18/2015 Imaging    MRI brain showed status post resection of right frontal lobe tumor without residual or recurrent tumor. 2. Otherwise negative MRI of the brain.       03/02/2016 Imaging    Stable MRI post right frontal lobe tumor resection. Negative for recurrent tumor       INTERVAL HISTORY: Please see below for problem oriented charting. She returns for further follow-up. She had recent vacation and felt good. She denies further allergic reaction or seizures. Her headaches remain the same and is well-controlled with current prescription medications. She denies new neurological deficit  REVIEW OF SYSTEMS:   Constitutional: Denies fevers, chills or abnormal weight loss Eyes: Denies blurriness of vision Ears, nose, mouth, throat, and face: Denies mucositis or sore throat Respiratory: Denies cough, dyspnea or wheezes Cardiovascular: Denies palpitation, chest discomfort or lower extremity swelling Gastrointestinal:  Denies nausea, heartburn or change in bowel habits Skin: Denies abnormal skin rashes Lymphatics: Denies new lymphadenopathy or easy bruising Neurological:Denies numbness, tingling or new weaknesses Behavioral/Psych: Mood is stable, no new changes  All other systems were reviewed with the patient and are negative.  I have reviewed the past medical history, past surgical history, social history and family  history with the patient and they are unchanged from previous note.  ALLERGIES:  is allergic to adhesive [tape]; latex; percocet [oxycodone-acetaminophen]; shellfish allergy; skin adhesives; soma [carisoprodol]; tramadol hcl; and vancomycin.  MEDICATIONS:  Current Outpatient Prescriptions  Medication Sig Dispense Refill  . butalbital-acetaminophen-caffeine (FIORICET, ESGIC) 50-325-40 MG tablet TAKE 1 TABLET BY MOUTH EVERY 6 HOURS AS NEEDED FOR HEADACE. 90 tablet 0  .  carisoprodol (SOMA) 350 MG tablet Take 1 tablet (350 mg total) by mouth 4 (four) times daily. For seizure prevention 100 tablet 0  . cetirizine (ZYRTEC) 10 MG tablet Take 10 mg by mouth daily.    . divalproex (DEPAKOTE ER) 250 MG 24 hr tablet Take 2 tablets twice a day 360 tablet 3  . gabapentin (NEURONTIN) 300 MG capsule TAKE 3 CAPSULES BY MOUTH IN THE MORNING AND 3 CAPSULES IN THE EVENING 540 capsule 3  . GLEOSTINE 100 MG capsule TAKE 2 CAPSULES BY MOUTH ONCE. TAKE ON AN EMTPY STOMACH 1 HOUR BEFORE OR 2 HOURS AFTER MEALS. CAUTION:CHEMOTHERAPY 2 capsule 0  . HYDROcodone-acetaminophen (NORCO) 10-325 MG tablet Take 1 tablet by mouth every 6 (six) hours as needed. 90 tablet 0  . levalbuterol (XOPENEX HFA) 45 MCG/ACT inhaler Inhale 1 puff into the lungs every 4 (four) hours as needed for wheezing. 1 Inhaler 12  . lidocaine-prilocaine (EMLA) cream Apply 1 application topically as needed. 30 g 6  . LORazepam (ATIVAN) 0.5 MG tablet Take 1 tablet (0.5 mg total) by mouth every 8 (eight) hours as needed for anxiety or seizure. 90 tablet 2  . morphine (MSIR) 15 MG tablet Take 1 tablet (15 mg total) by mouth every 6 (six) hours as needed for severe pain. 90 tablet 0  . ondansetron (ZOFRAN ODT) 8 MG disintegrating tablet Take 1 tablet (8 mg total) by mouth every 8 (eight) hours as needed for nausea or vomiting. 90 tablet 3  . PRESCRIPTION MEDICATION Chemotherapy regimen. [facility]    . procarbazine (MATULANE) 50 MG capsule Take 2 capsules (100 mg total) by mouth daily. Follow low tyramine diet. Take for 14 days starting on Day 8. Repeat every 6 weeks 28 capsule 12  . promethazine (PHENERGAN) 25 MG tablet Take 25 mg by mouth every 6 (six) hours as needed for nausea or vomiting. Reported on 09/21/2015    . vinCRIStine 2 mg in sodium chloride 0.9 % 50 mL as directed.    Marland Kitchen levocetirizine (XYZAL) 5 MG tablet Take 1 tablet (5 mg total) by mouth every evening. (Patient not taking: Reported on 03/09/2016) 60 tablet 5  .  montelukast (SINGULAIR) 10 MG tablet Take 1 tablet (10 mg total) by mouth at bedtime. (Patient not taking: Reported on 03/09/2016) 30 tablet 5  . predniSONE (DELTASONE) 10 MG tablet Take 1 tablet (10 mg total) by mouth as needed. (Patient not taking: Reported on 03/09/2016) 30 tablet 0   No current facility-administered medications for this visit.     PHYSICAL EXAMINATION: ECOG PERFORMANCE STATUS: 0 - Asymptomatic  Vitals:   03/09/16 1255  BP: 115/66  Pulse: 75  Resp: 18  Temp: 98.1 F (36.7 C)   Filed Weights   03/09/16 1255  Weight: 153 lb 8 oz (69.6 kg)    GENERAL:alert, no distress and comfortable SKIN: skin color, texture, turgor are normal, no rashes or significant lesions EYES: normal, Conjunctiva are pink and non-injected, sclera clear Musculoskeletal:no cyanosis of digits and no clubbing  NEURO: alert & oriented x 3 with fluent speech, no focal motor/sensory deficits  LABORATORY  DATA:  I have reviewed the data as listed    Component Value Date/Time   NA 139 02/17/2016 0357   NA 142 02/16/2016 1429   K 3.8 02/17/2016 0357   K 4.1 02/16/2016 1429   CL 108 02/17/2016 0357   CO2 25 02/17/2016 0357   CO2 24 02/16/2016 1429   GLUCOSE 93 02/17/2016 0357   GLUCOSE 82 02/16/2016 1429   BUN 8 02/17/2016 0357   BUN 11.8 02/16/2016 1429   CREATININE 0.69 02/17/2016 0357   CREATININE 0.8 02/16/2016 1429   CALCIUM 9.1 02/17/2016 0357   CALCIUM 9.2 02/16/2016 1429   PROT 6.5 02/17/2016 0357   PROT 6.8 02/16/2016 1429   ALBUMIN 4.1 02/17/2016 0357   ALBUMIN 4.2 02/16/2016 1429   AST 19 02/17/2016 0357   AST 19 02/16/2016 1429   ALT 22 02/17/2016 0357   ALT 22 02/16/2016 1429   ALKPHOS 32 (L) 02/17/2016 0357   ALKPHOS 35 (L) 02/16/2016 1429   BILITOT 0.6 02/17/2016 0357   BILITOT 0.42 02/16/2016 1429   GFRNONAA >60 02/17/2016 0357   GFRAA >60 02/17/2016 0357    No results found for: SPEP, UPEP  Lab Results  Component Value Date   WBC 1.9 (L) 02/17/2016    NEUTROABS 1.1 (L) 02/16/2016   HGB 9.6 (L) 02/17/2016   HCT 28.0 (L) 02/17/2016   MCV 98.2 02/17/2016   PLT 131 (L) 02/17/2016      Chemistry      Component Value Date/Time   NA 139 02/17/2016 0357   NA 142 02/16/2016 1429   K 3.8 02/17/2016 0357   K 4.1 02/16/2016 1429   CL 108 02/17/2016 0357   CO2 25 02/17/2016 0357   CO2 24 02/16/2016 1429   BUN 8 02/17/2016 0357   BUN 11.8 02/16/2016 1429   CREATININE 0.69 02/17/2016 0357   CREATININE 0.8 02/16/2016 1429      Component Value Date/Time   CALCIUM 9.1 02/17/2016 0357   CALCIUM 9.2 02/16/2016 1429   ALKPHOS 32 (L) 02/17/2016 0357   ALKPHOS 35 (L) 02/16/2016 1429   AST 19 02/17/2016 0357   AST 19 02/16/2016 1429   ALT 22 02/17/2016 0357   ALT 22 02/16/2016 1429   BILITOT 0.6 02/17/2016 0357   BILITOT 0.42 02/16/2016 1429       RADIOGRAPHIC STUDIES: I reviewed recent MRI scan with the patient I have personally reviewed the radiological images as listed and agreed with the findings in the report.    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 Overall consensus is the patient has no residual malignancy. General recommendation is for observation only with repeat imaging study in 3 months. 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  I refilled prescription for IR morphine    Adverse drug reaction She had recent drug reaction to procarbazine and possible cough variant asthma. Due to the stability of MRI, she will no longer need systemic treatment. She has been referred to pulmonary clinic and allergy clinic. I will defer to them for further  management   Orders Placed This Encounter  Procedures  . MR Brain W Wo Contrast    Standing Status:   Future  Standing Expiration Date:   04/13/2017    Order Specific Question:   Reason for exam:    Answer:   brain tumor, exclude progression    Order Specific Question:   Preferred imaging location?    Answer:   [facility] (table limit-350 lbs)    Order Specific Question:   Does the patient have a pacemaker or implanted devices?    Answer:   No   All questions were answered. The patient knows to call the clinic with any problems, questions or concerns. No barriers to learning was detected. I spent 15 minutes counseling the patient face to face. The total time spent in the appointment was 20 minutes and more than 50% was on counseling and review of test results     [facility], Sharp, MD 03/09/2016 1:25 PM

## 2016-03-09 NOTE — Assessment & Plan Note (Signed)
She had recent drug reaction to procarbazine and possible cough variant asthma. Due to the stability of MRI, she will no longer need systemic treatment. She has been referred to pulmonary clinic and allergy clinic. I will defer to them for further management

## 2016-03-16 MED FILL — IBUPROFEN 800 MG TABLET: 800 | 30 days supply | Qty: 90 | Fill #0

## 2016-03-17 ENCOUNTER — Other Ambulatory Visit: Payer: Self-pay | Admitting: *Deleted

## 2016-03-17 MED ORDER — BUTALBITAL-APAP-CAFFEINE 50-325-40 MG PO TABS: ORAL_TABLET | ORAL | 0 refills | Status: DC

## 2016-03-17 MED ORDER — HYDROCODONE-ACETAMINOPHEN 10-325 MG PO TABS: 1.0000 | ORAL_TABLET | Freq: Four times a day (QID) | ORAL | 0 refills | Status: DC | PRN

## 2016-03-17 MED ORDER — HYDROCODONE-ACETAMINOPHEN 10-325 MG PO TABS
1.0000 | ORAL_TABLET | Freq: Four times a day (QID) | ORAL | 0 refills | Status: DC | PRN
Start: 2016-03-17 — End: 2016-03-17

## 2016-03-17 MED FILL — BUTALB-ACETAMIN-CAFF 50-325: 50-325-40 | 22 days supply | Qty: 90 | Fill #0

## 2016-03-17 MED FILL — OMEPRAZOLE DR 20 MG CAPSULE: 20 | 30 days supply | Qty: 30 | Fill #0

## 2016-03-17 NOTE — Telephone Encounter (Signed)
Pt left VM states her insurance changes tomorrow and she is worried the prices will go up.  She asked if she can have refill on Fioricet and Hydrocodone today.   Per Dr. Alvy Bimler ok to give refills but unsure if pharmacy will fill Rx this early.  Pt can try.  Called pt back and left her VM to come before 5 pm to get Rxs. Her husband came by and picked up the Rx for Fioricet and Hydrocodone to take to pharmacy.

## 2016-03-20 MED FILL — HYDROCODON-APAP 10-325: 10-325 | 22 days supply | Qty: 90 | Fill #0

## 2016-03-22 MED FILL — GABAPENTIN 300 MG CAPSULE: 300 | 90 days supply | Qty: 540 | Fill #0

## 2016-03-28 ENCOUNTER — Other Ambulatory Visit: Payer: Self-pay | Admitting: Hematology and Oncology

## 2016-03-28 DIAGNOSIS — R4702 Dysphasia: Secondary | ICD-10-CM

## 2016-03-28 DIAGNOSIS — C711 Malignant neoplasm of frontal lobe: Secondary | ICD-10-CM

## 2016-03-28 DIAGNOSIS — R202 Paresthesia of skin: Secondary | ICD-10-CM

## 2016-03-28 DIAGNOSIS — R519 Headache, unspecified: Secondary | ICD-10-CM

## 2016-03-28 DIAGNOSIS — R51 Headache: Secondary | ICD-10-CM

## 2016-03-28 DIAGNOSIS — R110 Nausea: Secondary | ICD-10-CM

## 2016-03-28 DIAGNOSIS — R0789 Other chest pain: Secondary | ICD-10-CM

## 2016-03-28 MED FILL — CARISOPRODOL 350 MG TABLET: 350 | 22 days supply | Qty: 90 | Fill #0

## 2016-03-29 MED FILL — LEVOCETIRIZINE 5 MG TABLET: 5 | 30 days supply | Qty: 30 | Fill #1

## 2016-03-29 MED FILL — MONTELUKAST SOD 10 MG TAB: 10 | 30 days supply | Qty: 30 | Fill #1

## 2016-04-05 ENCOUNTER — Telehealth: Payer: Self-pay | Admitting: *Deleted

## 2016-04-05 ENCOUNTER — Other Ambulatory Visit: Payer: Self-pay | Admitting: *Deleted

## 2016-04-05 DIAGNOSIS — C711 Malignant neoplasm of frontal lobe: Secondary | ICD-10-CM

## 2016-04-05 MED ORDER — MORPHINE SULFATE 15 MG PO TABS
15.0000 mg | ORAL_TABLET | Freq: Four times a day (QID) | ORAL | 0 refills | Status: DC | PRN
Start: 2016-04-05 — End: 2016-04-28

## 2016-04-05 MED ORDER — BUTALBITAL-APAP-CAFFEINE 50-325-40 MG PO TABS: ORAL_TABLET | ORAL | 0 refills | Status: DC

## 2016-04-05 MED ORDER — LORAZEPAM 0.5 MG PO TABS: 0.5000 mg | ORAL_TABLET | Freq: Three times a day (TID) | ORAL | 2 refills | Status: DC | PRN

## 2016-04-05 MED FILL — MORPHINE SULFATE IR 15 MG T: 15 | 22 days supply | Qty: 90 | Fill #0

## 2016-04-05 MED FILL — LORazepam 0.5 MG TABS: 0.5 | 30 days supply | Qty: 90 | Fill #0

## 2016-04-05 MED FILL — BUTALB-ACETAMIN-CAFF 50-325: 50-325-40 | 22 days supply | Qty: 90 | Fill #0

## 2016-04-05 NOTE — Telephone Encounter (Signed)
Pt called, requesting refills of Fioricet, Lorazepam and Morphine.

## 2016-04-05 NOTE — Telephone Encounter (Signed)
Please refill all on print: 90 tabs max

## 2016-04-14 ENCOUNTER — Other Ambulatory Visit: Payer: Medicaid Other

## 2016-04-14 ENCOUNTER — Other Ambulatory Visit: Payer: Self-pay | Admitting: Otolaryngology

## 2016-04-14 DIAGNOSIS — H6502 Acute serous otitis media, left ear: Secondary | ICD-10-CM

## 2016-04-14 DIAGNOSIS — R065 Mouth breathing: Secondary | ICD-10-CM

## 2016-04-14 DIAGNOSIS — J310 Chronic rhinitis: Secondary | ICD-10-CM

## 2016-04-14 DIAGNOSIS — R0683 Snoring: Secondary | ICD-10-CM

## 2016-04-15 MED FILL — CARISOPRODOL 350 MG TABLET: 350 | 22 days supply | Qty: 90 | Fill #1

## 2016-04-18 ENCOUNTER — Telehealth: Payer: Self-pay | Admitting: Hematology and Oncology

## 2016-04-18 NOTE — Telephone Encounter (Signed)
lvm to inform pt of flush appt 10/6 per [facility]

## 2016-04-19 ENCOUNTER — Other Ambulatory Visit: Payer: Self-pay | Admitting: *Deleted

## 2016-04-19 MED ORDER — HYDROCODONE-ACETAMINOPHEN 10-325 MG PO TABS: 1.0000 | ORAL_TABLET | Freq: Four times a day (QID) | ORAL | 0 refills | Status: DC | PRN

## 2016-04-19 MED FILL — HYDROCODON-APAP 10-325: 10-325 | 23 days supply | Qty: 90 | Fill #0

## 2016-04-21 MED FILL — LEVOCETIRIZINE 5 MG TABLET: 5 | 30 days supply | Qty: 30 | Fill #2

## 2016-04-22 ENCOUNTER — Ambulatory Visit (HOSPITAL_BASED_OUTPATIENT_CLINIC_OR_DEPARTMENT_OTHER): Payer: Medicare Other

## 2016-04-22 DIAGNOSIS — Z452 Encounter for adjustment and management of vascular access device: Secondary | ICD-10-CM | POA: Diagnosis not present

## 2016-04-22 DIAGNOSIS — C711 Malignant neoplasm of frontal lobe: Secondary | ICD-10-CM | POA: Diagnosis not present

## 2016-04-22 MED ORDER — HEPARIN SOD (PORK) LOCK FLUSH 100 UNIT/ML IV SOLN
500.0000 [IU] | Freq: Once | INTRAVENOUS | Status: AC | PRN
  Administered 2016-04-22: 500 [IU] via INTRAVENOUS
  Filled 2016-04-22: qty 5

## 2016-04-22 MED ORDER — SODIUM CHLORIDE 0.9 % IJ SOLN
10.0000 mL | INTRAMUSCULAR | Status: DC | PRN
  Administered 2016-04-22: 10 mL via INTRAVENOUS
  Filled 2016-04-22: qty 10

## 2016-04-27 ENCOUNTER — Other Ambulatory Visit: Payer: Self-pay | Admitting: Hematology and Oncology

## 2016-04-27 ENCOUNTER — Telehealth: Payer: Self-pay | Admitting: *Deleted

## 2016-04-27 DIAGNOSIS — C711 Malignant neoplasm of frontal lobe: Secondary | ICD-10-CM

## 2016-04-27 MED FILL — MONTELUKAST SOD 10 MG TAB: 10 | 30 days supply | Qty: 30 | Fill #2

## 2016-04-27 NOTE — Telephone Encounter (Signed)
Pt asks for refill on Lorazepam and Fioricet called into Pharmacy.  She will also need Refill on MS IR 15 mg to pick up by end of this week.   She says lorazepam may be a little early but due to her new insurance they tell her it will need Prior Auth.  She is not sure how long the P.A. Will take so asks if it can be called in now so it will be approved by the time she runs out next week.

## 2016-04-28 ENCOUNTER — Encounter: Payer: Self-pay | Admitting: Hematology and Oncology

## 2016-04-28 MED ORDER — LORAZEPAM 0.5 MG PO TABS: 0.5000 mg | ORAL_TABLET | Freq: Three times a day (TID) | ORAL | 0 refills | Status: DC | PRN

## 2016-04-28 MED ORDER — BUTALBITAL-APAP-CAFFEINE 50-325-40 MG PO TABS: ORAL_TABLET | ORAL | 0 refills | Status: DC

## 2016-04-28 MED ORDER — MORPHINE SULFATE 15 MG PO TABS: 15.0000 mg | ORAL_TABLET | Freq: Four times a day (QID) | ORAL | 0 refills | Status: DC | PRN

## 2016-04-28 MED FILL — BUTALB-ACETAMIN-CAFF 50-325: 50-325-40 | 22 days supply | Qty: 90 | Fill #0

## 2016-04-28 MED FILL — MORPHINE SULFATE IR 15 MG T: 15 | 22 days supply | Qty: 90 | Fill #0

## 2016-04-28 NOTE — Progress Notes (Signed)
Submitted auth for Lorazepam Covermymeds. Status pending.

## 2016-04-28 NOTE — Telephone Encounter (Signed)
Informed pt of Refills on lorazepam and fioricet called into [facility] patient [facility].  Rx for MS IR ready to pick up at our office. She verbalized understanding.

## 2016-04-29 DIAGNOSIS — F5101 Primary insomnia: Secondary | ICD-10-CM | POA: Diagnosis not present

## 2016-04-29 DIAGNOSIS — C719 Malignant neoplasm of brain, unspecified: Secondary | ICD-10-CM | POA: Diagnosis not present

## 2016-04-29 DIAGNOSIS — G40909 Epilepsy, unspecified, not intractable, without status epilepticus: Secondary | ICD-10-CM | POA: Diagnosis not present

## 2016-05-02 MED FILL — LORazepam 0.5 MG TABS: 0.5 | 30 days supply | Qty: 90 | Fill #1

## 2016-05-04 ENCOUNTER — Encounter: Payer: Self-pay | Admitting: Hematology and Oncology

## 2016-05-04 NOTE — Progress Notes (Signed)
Lorazepam approved. [id number] good from 04/29/16 - 04/29/17.

## 2016-05-05 MED FILL — CARISOPRODOL 350 MG TABLET: 350 | 22 days supply | Qty: 90 | Fill #2

## 2016-05-10 ENCOUNTER — Telehealth: Payer: Self-pay | Admitting: *Deleted

## 2016-05-10 MED ORDER — HYDROCODONE-ACETAMINOPHEN 10-325 MG PO TABS: 1.0000 | ORAL_TABLET | Freq: Four times a day (QID) | ORAL | 0 refills | Status: DC | PRN

## 2016-05-10 NOTE — Telephone Encounter (Signed)
Pt left VM requests refill on Hydrocodone.  (Rx left for Dr. Alvy Bimler to sign on her desk).   Pt also says she wants to make sure we are aware of her insurance changes for the Prior Auth on her MRI.  I sent staff message to Gaspar Bidding to check on PA for MRI which is ordered to be done on 11/15.  Pt says need to [facility] first, then Medicaid second.

## 2016-05-11 MED ORDER — HYDROCODONE-ACETAMINOPHEN 10-325 MG PO TABS: 1.0000 | ORAL_TABLET | Freq: Four times a day (QID) | ORAL | 0 refills | Status: DC | PRN

## 2016-05-11 MED FILL — HYDROCODON-APAP 10-325: 10-325 | 22 days supply | Qty: 90 | Fill #0

## 2016-05-11 NOTE — Telephone Encounter (Signed)
Husband came by to pick up Rx for Hydrocodone.  Informed him Dr. Alvy Bimler is not in office today to sign Rx.  She will be back tomorrow.  He says pt is out of this pain medication and needs filled today.  Dr. Alen Blew was kind enought to sign Rx for pt's hydrocodone today.  Gave Rx to pt's husband, [name].

## 2016-05-11 NOTE — Telephone Encounter (Signed)
"  I called yesterday, recording was cut off  and did not receive return call.  Was message received."  Yes and currently working on these request.  Prescription is ready for pick up.

## 2016-05-18 ENCOUNTER — Telehealth: Payer: Self-pay | Admitting: *Deleted

## 2016-05-19 MED ORDER — BUTALBITAL-APAP-CAFFEINE 50-325-40 MG PO TABS: ORAL_TABLET | ORAL | 0 refills | Status: DC

## 2016-05-19 MED ORDER — MORPHINE SULFATE 30 MG PO TABS: 30.0000 mg | ORAL_TABLET | ORAL | 0 refills | Status: DC | PRN

## 2016-05-19 MED FILL — LEVOCETIRIZINE 5 MG TABLET: 5 | 30 days supply | Qty: 30 | Fill #3

## 2016-05-19 MED FILL — BUTALB-ACETAMIN-CAFF 50-325: 50-325-40 | 22 days supply | Qty: 90 | Fill #0

## 2016-05-19 MED FILL — MORPHINE SULFATE IR 30 MG T: 30 | 15 days supply | Qty: 90 | Fill #0

## 2016-05-19 NOTE — Telephone Encounter (Signed)
Pt requests refills on Fioricet and MS IR 15 mg.  Pt says she is having more migraine pain due to colder weather. She is having to take 2 MS at a time instead of 1 now. Rx can still be refilled at this time as it is currently written, but pt asks if Dr. Alvy Bimler wants to increase the MS IR?

## 2016-05-19 NOTE — Telephone Encounter (Signed)
Pt left VM states ok w/ her for dose increase MS to 30 mg.  I called [name] to let him know Rx ready to pick up at our office.  I attached copy of Schedule w/ MRI and note informing of lab/flush appt will be moved to 2 pm before MRI to the Rx.

## 2016-05-19 NOTE — Telephone Encounter (Signed)
PLease increase to 30 mg IR Please print and get Dr. Alen Blew to sign it

## 2016-05-19 NOTE — Telephone Encounter (Signed)
LVM for pt to inform Dr. Alvy Bimler can increase MS IR to 30 mg.  Asked her to call nurse back to confirm if this is ok w/ her.   Fioricet refill called to [facility]. Also scheduled MRI for 11/15 at 3 pm.  Scheduling message sent to move lab/ flush to 2 pm same day.

## 2016-05-20 MED FILL — MONTELUKAST SOD 10 MG TAB: 10 | 30 days supply | Qty: 30 | Fill #3

## 2016-05-25 MED FILL — CARISOPRODOL 350 MG TABLET: 350 | 22 days supply | Qty: 90 | Fill #3

## 2016-05-26 ENCOUNTER — Ambulatory Visit: Payer: Medicaid Other | Admitting: Neurology

## 2016-06-01 ENCOUNTER — Ambulatory Visit (HOSPITAL_BASED_OUTPATIENT_CLINIC_OR_DEPARTMENT_OTHER): Payer: Medicare Other

## 2016-06-01 ENCOUNTER — Other Ambulatory Visit (HOSPITAL_BASED_OUTPATIENT_CLINIC_OR_DEPARTMENT_OTHER): Payer: Medicare Other

## 2016-06-01 ENCOUNTER — Other Ambulatory Visit: Payer: Self-pay | Admitting: *Deleted

## 2016-06-01 ENCOUNTER — Ambulatory Visit (HOSPITAL_COMMUNITY)
Admission: RE | Admit: 2016-06-01 | Discharge: 2016-06-01 | Disposition: A | Discharge status: Home / Self Care | Payer: Medicare Other | Source: Ambulatory Visit | Attending: Hematology and Oncology | Admitting: Hematology and Oncology

## 2016-06-01 ENCOUNTER — Other Ambulatory Visit: Payer: Medicaid Other

## 2016-06-01 DIAGNOSIS — C711 Malignant neoplasm of frontal lobe: Secondary | ICD-10-CM

## 2016-06-01 LAB — CBC WITH DIFFERENTIAL/PLATELET
BASO%: 0.3 % (ref 0.0–2.0)
Basophils Absolute: 0 10*3/uL (ref 0.0–0.1)
EOS%: 1 % (ref 0.0–7.0)
Eosinophils Absolute: 0 10*3/uL (ref 0.0–0.5)
HCT: 31.9 % — ABNORMAL LOW (ref 34.8–46.6)
HGB: 11.2 g/dL — ABNORMAL LOW (ref 11.6–15.9)
LYMPH%: 31.9 % (ref 14.0–49.7)
MCH: 35.4 pg — ABNORMAL HIGH (ref 25.1–34.0)
MCHC: 35.1 g/dL (ref 31.5–36.0)
MCV: 100.9 fL (ref 79.5–101.0)
MONO#: 0.3 10*3/uL (ref 0.1–0.9)
MONO%: 8.7 % (ref 0.0–14.0)
NEUT#: 1.7 10*3/uL (ref 1.5–6.5)
NEUT%: 58.1 % (ref 38.4–76.8)
Platelets: 171 10*3/uL (ref 145–400)
RBC: 3.16 10*6/uL — ABNORMAL LOW (ref 3.70–5.45)
RDW: 10.4 % — ABNORMAL LOW (ref 11.2–14.5)
WBC: 2.9 10*3/uL — ABNORMAL LOW (ref 3.9–10.3)
lymph#: 0.9 10*3/uL (ref 0.9–3.3)

## 2016-06-01 LAB — COMPREHENSIVE METABOLIC PANEL
ALT: 16 U/L (ref 0–55)
AST: 19 U/L (ref 5–34)
Albumin: 3.8 g/dL (ref 3.5–5.0)
Alkaline Phosphatase: 47 U/L (ref 40–150)
Anion Gap: 8 mEq/L (ref 3–11)
BUN: 7 mg/dL (ref 7.0–26.0)
CO2: 25 mEq/L (ref 22–29)
Calcium: 9.4 mg/dL (ref 8.4–10.4)
Chloride: 106 mEq/L (ref 98–109)
Creatinine: 0.7 mg/dL (ref 0.6–1.1)
EGFR: >90 mL/min/{1.73_m2} (ref >90)
Glucose: 83 mg/dl (ref 70–140)
Potassium: 4 mEq/L (ref 3.5–5.1)
Sodium: 140 mEq/L (ref 136–145)
Total Bilirubin: 0.44 mg/dL (ref 0.20–1.20)
Total Protein: 6.8 g/dL (ref 6.4–8.3)

## 2016-06-01 MED ORDER — BUTALBITAL-APAP-CAFFEINE 50-325-40 MG PO TABS: ORAL_TABLET | ORAL | 0 refills | Status: DC

## 2016-06-01 MED ORDER — LORAZEPAM 0.5 MG PO TABS: 0.5000 mg | ORAL_TABLET | Freq: Three times a day (TID) | ORAL | 0 refills | Status: DC | PRN

## 2016-06-01 MED ORDER — SODIUM CHLORIDE 0.9 % IJ SOLN
10.0000 mL | INTRAMUSCULAR | Status: DC | PRN
  Administered 2016-06-01: 10 mL via INTRAVENOUS
  Filled 2016-06-01: qty 10

## 2016-06-01 MED ORDER — GADOBENATE DIMEGLUMINE 529 MG/ML IV SOLN
15.0000 mL | Freq: Once | INTRAVENOUS | Status: AC | PRN
  Administered 2016-06-01: 14 mL via INTRAVENOUS

## 2016-06-01 MED ORDER — HYDROCODONE-ACETAMINOPHEN 10-325 MG PO TABS: 1.0000 | ORAL_TABLET | Freq: Four times a day (QID) | ORAL | 0 refills | Status: DC | PRN

## 2016-06-02 ENCOUNTER — Encounter: Payer: Self-pay | Admitting: Hematology and Oncology

## 2016-06-02 ENCOUNTER — Ambulatory Visit (HOSPITAL_BASED_OUTPATIENT_CLINIC_OR_DEPARTMENT_OTHER): Payer: Medicare Other | Admitting: Hematology and Oncology

## 2016-06-02 ENCOUNTER — Other Ambulatory Visit: Payer: Self-pay | Admitting: *Deleted

## 2016-06-02 DIAGNOSIS — G40109 Localization-related (focal) (partial) symptomatic epilepsy and epileptic syndromes with simple partial seizures, not intractable, without status epilepticus: Secondary | ICD-10-CM | POA: Diagnosis not present

## 2016-06-02 DIAGNOSIS — C719 Malignant neoplasm of brain, unspecified: Secondary | ICD-10-CM

## 2016-06-02 DIAGNOSIS — R4702 Dysphasia: Secondary | ICD-10-CM

## 2016-06-02 DIAGNOSIS — C711 Malignant neoplasm of frontal lobe: Secondary | ICD-10-CM

## 2016-06-02 DIAGNOSIS — R4189 Other symptoms and signs involving cognitive functions and awareness: Secondary | ICD-10-CM

## 2016-06-02 DIAGNOSIS — T451X5A Adverse effect of antineoplastic and immunosuppressive drugs, initial encounter: Secondary | ICD-10-CM

## 2016-06-02 DIAGNOSIS — D61810 Antineoplastic chemotherapy induced pancytopenia: Secondary | ICD-10-CM | POA: Diagnosis not present

## 2016-06-02 DIAGNOSIS — R202 Paresthesia of skin: Secondary | ICD-10-CM

## 2016-06-02 DIAGNOSIS — R0789 Other chest pain: Secondary | ICD-10-CM

## 2016-06-02 DIAGNOSIS — M792 Neuralgia and neuritis, unspecified: Secondary | ICD-10-CM

## 2016-06-02 DIAGNOSIS — R110 Nausea: Secondary | ICD-10-CM

## 2016-06-02 DIAGNOSIS — R519 Headache, unspecified: Secondary | ICD-10-CM

## 2016-06-02 DIAGNOSIS — R51 Headache: Secondary | ICD-10-CM

## 2016-06-02 MED ORDER — SODIUM CHLORIDE 0.9 % IJ SOLN
10.0000 mL | INTRAMUSCULAR | Status: DC | PRN
  Administered 2016-06-01: 10 mL via INTRAVENOUS
  Filled 2016-06-02: qty 10

## 2016-06-02 MED ORDER — CARISOPRODOL 350 MG PO TABS: ORAL_TABLET | ORAL | 3 refills | Status: DC

## 2016-06-02 MED ORDER — HEPARIN SOD (PORK) LOCK FLUSH 100 UNIT/ML IV SOLN
500.0000 [IU] | Freq: Once | INTRAVENOUS | Status: AC | PRN
  Administered 2016-06-01: 500 [IU] via INTRAVENOUS
  Filled 2016-06-02: qty 5

## 2016-06-02 MED FILL — HYDROCODON-APAP 10-325: 10-325 | 23 days supply | Qty: 90 | Fill #0

## 2016-06-02 MED FILL — LORazepam 0.5 MG TABS: 0.5 | 30 days supply | Qty: 90 | Fill #0

## 2016-06-02 MED FILL — CARISOPRODOL 350 MG TABLET: 350 | 23 days supply | Qty: 90 | Fill #0

## 2016-06-02 NOTE — Assessment & Plan Note (Signed)
This is likely due to recent treatment. The patient denies recent history of bleeding such as epistaxis, hematuria or hematochezia. No recent infection. She is asymptomatic from the anemia and low white blood cell count. I will observe for now.

## 2016-06-02 NOTE — Progress Notes (Signed)
Pt came to infusion room after leaving her MRI - she stated there was no one there to deaccess her she was told so she came here to have PAC deaccessed.

## 2016-06-02 NOTE — Assessment & Plan Note (Signed)
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  I refilled prescription for pain medications and other supportive care medications

## 2016-06-02 NOTE — Assessment & Plan Note (Signed)
The patient has recurrent seizures. She will continue anti-seizure medications as prescribed
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
## 2016-06-02 NOTE — Assessment & Plan Note (Signed)
The MRIs show stable disease with no changes. She is concerned about subtle changes that she sees on certain cuts/images. She is wondering whether a functional MRI would be helpful. I will get her case presented at the next [facility] tumor board for radiology review. The patient appears to be interested to go back to [facility] after the new year to see a Neurosurgeon there. We discussed cancer survivorship. She is interested for referral to neuropsychiatrist, cancer survivorship clinic and palliative care clinic. I will arrange for referrals.

## 2016-06-03 ENCOUNTER — Telehealth: Payer: Self-pay | Admitting: *Deleted

## 2016-06-03 ENCOUNTER — Other Ambulatory Visit: Payer: Self-pay | Admitting: *Deleted

## 2016-06-03 DIAGNOSIS — C719 Malignant neoplasm of brain, unspecified: Secondary | ICD-10-CM

## 2016-06-03 DIAGNOSIS — R4189 Other symptoms and signs involving cognitive functions and awareness: Secondary | ICD-10-CM

## 2016-06-03 NOTE — Progress Notes (Signed)
Referral to [facility] for Neuropsych eval ordered.

## 2016-06-03 NOTE — Telephone Encounter (Signed)
Called pt to get a time that will work for her to see our surviviorship NP. No answer but left message to call this nurse back so she can be scheduled for an appt. Also left message that this appt ws a referral from Dr. Alvy Bimler. Message to be fwd to G. Dawson,NP.

## 2016-06-03 NOTE — Telephone Encounter (Signed)
Pls do

## 2016-06-03 NOTE — Telephone Encounter (Signed)
[facility] to notify of Referral to Neuropsychology by Dr. Alvy Bimler.  S/w Angie who says they do not have that service anymore. She suggested to make referral to [facility].    Pt already sees Neuro,  Dr. Delice Lesch, at [facility].

## 2016-06-03 NOTE — Telephone Encounter (Signed)
2nd attempt calling . No answer. Left message again to VM.

## 2016-06-06 ENCOUNTER — Other Ambulatory Visit: Payer: Self-pay | Admitting: *Deleted

## 2016-06-10 ENCOUNTER — Telehealth: Payer: Self-pay | Admitting: *Deleted

## 2016-06-10 ENCOUNTER — Telehealth: Payer: Self-pay | Admitting: Hematology and Oncology

## 2016-06-10 MED FILL — BUTALB-ACETAMIN-CAFF 50-325: 50-325-40 | 22 days supply | Qty: 90 | Fill #0

## 2016-06-10 NOTE — Telephone Encounter (Signed)
Called patient to let her know Dr Alvy Bimler presented her case at Tumor board, no changes

## 2016-06-10 NOTE — Telephone Encounter (Signed)
Left message re 12/28 appointments. Schedule mailed.

## 2016-06-13 ENCOUNTER — Encounter: Payer: Self-pay | Admitting: Internal Medicine

## 2016-06-13 NOTE — Telephone Encounter (Signed)
Victoria Bates left message stating she was returning Victoria Bates's call to schedule an appt with Victoria Bates.  RN spoke with Victoria Bates. We will wait to schedule Victoria Bates until after her next appt with Victoria Bates on 12/28. LM for Victoria Bates to schedule appt after she sees Victoria Bates on 12/28.

## 2016-06-13 NOTE — Progress Notes (Unsigned)
Left message for [facility] offering Palliative Medicine support.  Await call back.  Wadie Lessen NP  Palliative Medicine Team Team Phone # [phone] Pager [phone]   No charge

## 2016-06-21 ENCOUNTER — Other Ambulatory Visit: Payer: Self-pay | Admitting: *Deleted

## 2016-06-21 MED ORDER — MORPHINE SULFATE 30 MG PO TABS: 30.0000 mg | ORAL_TABLET | ORAL | 0 refills | Status: DC | PRN

## 2016-06-21 MED FILL — MORPHINE SULFATE IR 30 MG T: 30 | 15 days supply | Qty: 90 | Fill #0

## 2016-06-22 MED FILL — MONTELUKAST SOD 10 MG TAB: 10 | 30 days supply | Qty: 30 | Fill #4

## 2016-06-22 MED FILL — CARISOPRODOL 350 MG TABLET: 350 | 23 days supply | Qty: 90 | Fill #1

## 2016-06-22 MED FILL — LEVOCETIRIZINE 5 MG TABLET: 5 | 30 days supply | Qty: 30 | Fill #4

## 2016-06-24 ENCOUNTER — Ambulatory Visit: Payer: Medicaid Other | Admitting: Neurology

## 2016-06-28 ENCOUNTER — Encounter: Payer: Self-pay | Admitting: Neurology

## 2016-06-29 ENCOUNTER — Other Ambulatory Visit: Payer: Self-pay | Admitting: *Deleted

## 2016-06-29 ENCOUNTER — Telehealth: Payer: Self-pay | Admitting: *Deleted

## 2016-06-29 MED ORDER — LORAZEPAM 0.5 MG PO TABS: 0.5000 mg | ORAL_TABLET | Freq: Three times a day (TID) | ORAL | 0 refills | Status: DC | PRN

## 2016-06-29 MED ORDER — BUTALBITAL-APAP-CAFFEINE 50-325-40 MG PO TABS: ORAL_TABLET | ORAL | 0 refills | Status: DC

## 2016-06-29 MED ORDER — HYDROCODONE-ACETAMINOPHEN 10-325 MG PO TABS: 1.0000 | ORAL_TABLET | Freq: Four times a day (QID) | ORAL | 0 refills | Status: DC | PRN

## 2016-06-29 NOTE — Telephone Encounter (Signed)
Please help me print them and leave them on my desk I will sign them tomorrow morning Thanks

## 2016-06-29 NOTE — Telephone Encounter (Signed)
Pt called requesting refills: Lorazepam, hydrocodone and fiorcet. Pt is aware that Dr Alvy Bimler is out of office today. Please call when rx are ready for pick up

## 2016-06-30 ENCOUNTER — Telehealth: Payer: Self-pay | Admitting: *Deleted

## 2016-06-30 MED FILL — LORazepam 0.5 MG TABS: 0.5 | 30 days supply | Qty: 90 | Fill #0

## 2016-06-30 MED FILL — HYDROCODON-APAP 10-325: 10-325 | 23 days supply | Qty: 90 | Fill #0

## 2016-06-30 MED FILL — BUTALB-ACETAMIN-CAFF 50-325: 50-325-40 | 23 days supply | Qty: 90 | Fill #0

## 2016-06-30 NOTE — Telephone Encounter (Signed)
LVM for pt informing of Rxs ready to pick up at our office.

## 2016-07-12 MED FILL — CARISOPRODOL 350 MG TABLET: 350 | 23 days supply | Qty: 90 | Fill #2

## 2016-07-14 ENCOUNTER — Encounter: Payer: Self-pay | Admitting: Hematology and Oncology

## 2016-07-14 ENCOUNTER — Telehealth: Payer: Self-pay | Admitting: Hematology and Oncology

## 2016-07-14 ENCOUNTER — Other Ambulatory Visit (HOSPITAL_BASED_OUTPATIENT_CLINIC_OR_DEPARTMENT_OTHER): Payer: Medicare Other

## 2016-07-14 ENCOUNTER — Ambulatory Visit: Payer: Medicare Other

## 2016-07-14 ENCOUNTER — Ambulatory Visit (HOSPITAL_BASED_OUTPATIENT_CLINIC_OR_DEPARTMENT_OTHER): Payer: Medicare Other | Admitting: Hematology and Oncology

## 2016-07-14 VITALS — BP 168/82 | HR 89 | Temp 98.1°F | Resp 18 | Ht 67.0 in | Wt 151.4 lb

## 2016-07-14 DIAGNOSIS — C711 Malignant neoplasm of frontal lobe: Secondary | ICD-10-CM

## 2016-07-14 DIAGNOSIS — M792 Neuralgia and neuritis, unspecified: Secondary | ICD-10-CM | POA: Diagnosis not present

## 2016-07-14 DIAGNOSIS — R4189 Other symptoms and signs involving cognitive functions and awareness: Secondary | ICD-10-CM

## 2016-07-14 DIAGNOSIS — G40109 Localization-related (focal) (partial) symptomatic epilepsy and epileptic syndromes with simple partial seizures, not intractable, without status epilepticus: Secondary | ICD-10-CM

## 2016-07-14 DIAGNOSIS — D61810 Antineoplastic chemotherapy induced pancytopenia: Secondary | ICD-10-CM | POA: Diagnosis not present

## 2016-07-14 DIAGNOSIS — T451X5A Adverse effect of antineoplastic and immunosuppressive drugs, initial encounter: Secondary | ICD-10-CM

## 2016-07-14 LAB — COMPREHENSIVE METABOLIC PANEL
ALT: 16 U/L (ref 0–55)
AST: 17 U/L (ref 5–34)
Albumin: 3.8 g/dL (ref 3.5–5.0)
Alkaline Phosphatase: 47 U/L (ref 40–150)
Anion Gap: 8 mEq/L (ref 3–11)
BUN: 7.2 mg/dL (ref 7.0–26.0)
CO2: 23 mEq/L (ref 22–29)
Calcium: 8.8 mg/dL (ref 8.4–10.4)
Chloride: 108 mEq/L (ref 98–109)
Creatinine: 0.6 mg/dL (ref 0.6–1.1)
EGFR: >90 mL/min/{1.73_m2} (ref >90)
Glucose: 83 mg/dl (ref 70–140)
Potassium: 4.3 mEq/L (ref 3.5–5.1)
Sodium: 139 mEq/L (ref 136–145)
Total Bilirubin: <0.22 mg/dL (ref 0.20–1.20)
Total Protein: 6.8 g/dL (ref 6.4–8.3)

## 2016-07-14 LAB — CBC WITH DIFFERENTIAL/PLATELET
BASO%: 0.4 % (ref 0.0–2.0)
Basophils Absolute: 0 10*3/uL (ref 0.0–0.1)
EOS%: 0.8 % (ref 0.0–7.0)
Eosinophils Absolute: 0 10*3/uL (ref 0.0–0.5)
HCT: 32.3 % — ABNORMAL LOW (ref 34.8–46.6)
HGB: 11.3 g/dL — ABNORMAL LOW (ref 11.6–15.9)
LYMPH%: 29.1 % (ref 14.0–49.7)
MCH: 33.9 pg (ref 25.1–34.0)
MCHC: 35 g/dL (ref 31.5–36.0)
MCV: 97 fL (ref 79.5–101.0)
MONO#: 0.2 10*3/uL (ref 0.1–0.9)
MONO%: 7.2 % (ref 0.0–14.0)
NEUT#: 1.7 10*3/uL (ref 1.5–6.5)
NEUT%: 62.5 % (ref 38.4–76.8)
Platelets: 163 10*3/uL (ref 145–400)
RBC: 3.33 10*6/uL — ABNORMAL LOW (ref 3.70–5.45)
RDW: 10.8 % — ABNORMAL LOW (ref 11.2–14.5)
WBC: 2.7 10*3/uL — ABNORMAL LOW (ref 3.9–10.3)
lymph#: 0.8 10*3/uL — ABNORMAL LOW (ref 0.9–3.3)

## 2016-07-14 MED ORDER — MORPHINE SULFATE 30 MG PO TABS: 30.0000 mg | ORAL_TABLET | ORAL | 0 refills | Status: DC | PRN

## 2016-07-14 MED ORDER — HYDROCODONE-ACETAMINOPHEN 10-325 MG PO TABS: 1.0000 | ORAL_TABLET | Freq: Four times a day (QID) | ORAL | 0 refills | Status: DC | PRN

## 2016-07-14 MED ORDER — LORAZEPAM 0.5 MG PO TABS: 0.5000 mg | ORAL_TABLET | Freq: Three times a day (TID) | ORAL | 0 refills | Status: DC | PRN

## 2016-07-14 MED ORDER — SODIUM CHLORIDE 0.9 % IJ SOLN
10.0000 mL | INTRAMUSCULAR | Status: DC | PRN
  Administered 2016-07-14: 10 mL via INTRAVENOUS
  Filled 2016-07-14: qty 10

## 2016-07-14 MED ORDER — HEPARIN SOD (PORK) LOCK FLUSH 100 UNIT/ML IV SOLN
500.0000 [IU] | Freq: Once | INTRAVENOUS | Status: AC | PRN
  Administered 2016-07-14: 500 [IU] via INTRAVENOUS
  Filled 2016-07-14: qty 5

## 2016-07-14 MED ORDER — BUTALBITAL-APAP-CAFFEINE 50-325-40 MG PO TABS: ORAL_TABLET | ORAL | 0 refills | Status: DC

## 2016-07-14 MED FILL — MORPHINE SULFATE IR 30 MG T: 30 | 15 days supply | Qty: 90 | Fill #0

## 2016-07-14 NOTE — Assessment & Plan Note (Signed)
The patient has recurrent seizures. She will continue anti-seizure medications as prescribed

## 2016-07-14 NOTE — Assessment & Plan Note (Signed)
This is likely due to recent treatment. The patient denies recent history of bleeding such as epistaxis, hematuria or hematochezia. No recent infection. She is asymptomatic from the anemia and low white blood cell count. I will observe for now.

## 2016-07-14 NOTE — Progress Notes (Signed)
[facility] OFFICE PROGRESS NOTE  Patient Care Team: Lavone Orn, MD as PCP - General (Internal Medicine) Ashok Pall, MD as Consulting Physician (Neurosurgery) Burnell Blanks. Salomon Fick, MD as Consulting Physician (Neurosurgery) Burnell Blanks. 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      09/27/2013 Pathology Results    Accession: [id number] pathology showed grade 2 oligodendroglioma with mutant IDH1 protein, ATRX, OLIG 2 expression. p53 is negative, low Ki-67 labeling index. 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      11/06/2013 Imaging    Repeat CT scan of the head showed resolution of abscess.      01/23/2014 Surgery    Dr. Christella Noa perform a subtotal gross resection of the tumor.      01/24/2014 Imaging    Repeat imaging studies showed persistent and residual disease.      03/03/2014 - 09/01/2014 Chemotherapy    She was started on Temodar, 5 days every 28 days      03/13/2014 Imaging    Repeat MRI showed no evidence of progression of disease.      07/14/2014 Imaging    Repeat MRI showed no evidence of disease progression.      09/12/2014 Imaging    MRI of the liver show mild regression of the size of lesions, presumed to be benign      09/15/2014 Imaging    Repeat MRI of the brain show persistent abnormalities, suspicious for residual disease      03/02/2015 Imaging     MRI brain at [facility] show persistent abnormalities      08/10/2015 Imaging    Repeat MRI [facility] showed stable disease      08/24/2015 - 02/08/2016 Chemotherapy    She  was started on Lomustine, Procarbazine and Vincristine chemotherapy      08/30/2015 Imaging    CT chest showed no evidence of aspiration pneumonia      11/18/2015 Imaging    MRI brain showed status post resection of right frontal lobe tumor without residual or recurrent tumor. 2. Otherwise negative MRI of the brain.       03/02/2016 Imaging    Stable MRI post right frontal lobe tumor resection. Negative for recurrent tumor      06/01/2016 Imaging    Stable MRI post right frontal lobe tumor resection. No recurrent tumor identified       INTERVAL HISTORY: Please see below for problem oriented charting. She returns for supportive care visit. She continues to have frequent seizures and poor memory. She denies depression. No focal neurological deficit. I had a neuropathic pain is well-controlled with current prescription pain medicine.  REVIEW OF SYSTEMS:   Constitutional: Denies fevers, chills or abnormal weight loss Eyes: Denies blurriness of vision Ears, nose, mouth, throat, and face: Denies mucositis or sore throat Respiratory: Denies cough, dyspnea or wheezes Cardiovascular: Denies palpitation, chest discomfort or lower extremity swelling Gastrointestinal:  Denies nausea, heartburn or change in bowel habits Skin: Denies abnormal skin rashes Lymphatics: Denies new lymphadenopathy or easy bruising Neurological:Denies numbness, tingling or new weaknesses Behavioral/Psych: Mood is stable, no new  changes  All other systems were reviewed with the patient and are negative.  I have reviewed the past medical history, past surgical history, social history and family history with the patient and they are unchanged from previous note.  ALLERGIES:  is allergic to adhesive [tape]; latex; percocet [oxycodone-acetaminophen]; shellfish allergy; skin adhesives; soma [carisoprodol]; tramadol hcl; and vancomycin.  MEDICATIONS:  Current Outpatient Prescriptions  Medication Sig Dispense Refill   . butalbital-acetaminophen-caffeine (FIORICET, ESGIC) 50-325-40 MG tablet TAKE 1 TABLET BY MOUTH EVERY 6 HOURS AS NEEDED FOR HEADACHE 90 tablet 0  . carisoprodol (SOMA) 350 MG tablet TAKE 1 TABLET BY MOUTH FOUR TIMES DAILY FOR SEIZURE PREVENTION 90 tablet 3  . divalproex (DEPAKOTE ER) 250 MG 24 hr tablet Take 2 tablets twice a day 360 tablet 3  . gabapentin (NEURONTIN) 300 MG capsule TAKE 3 CAPSULES BY MOUTH IN THE MORNING AND 3 CAPSULES IN THE EVENING 540 capsule 3  . GLEOSTINE 100 MG capsule TAKE 2 CAPSULES BY MOUTH ONCE. TAKE ON AN EMTPY STOMACH 1 HOUR BEFORE OR 2 HOURS AFTER MEALS. CAUTION:CHEMOTHERAPY 2 capsule 0  . HYDROcodone-acetaminophen (NORCO) 10-325 MG tablet Take 1 tablet by mouth every 6 (six) hours as needed. 90 tablet 0  . levocetirizine (XYZAL) 5 MG tablet Take 1 tablet (5 mg total) by mouth every evening. 60 tablet 5  . lidocaine-prilocaine (EMLA) cream Apply 1 application topically as needed. 30 g 6  . LORazepam (ATIVAN) 0.5 MG tablet Take 1 tablet (0.5 mg total) by mouth every 8 (eight) hours as needed for anxiety or seizure. 90 tablet 0  . montelukast (SINGULAIR) 10 MG tablet Take 1 tablet (10 mg total) by mouth at bedtime. 30 tablet 5  . morphine (MSIR) 30 MG tablet Take 1 tablet (30 mg total) by mouth every 4 (four) hours as needed for severe pain. 90 tablet 0  . ondansetron (ZOFRAN ODT) 8 MG disintegrating tablet Take 1 tablet (8 mg total) by mouth every 8 (eight) hours as needed for nausea or vomiting. 90 tablet 3  . PRESCRIPTION MEDICATION Chemotherapy regimen. RCC    . promethazine (PHENERGAN) 25 MG tablet Take 25 mg by mouth every 6 (six) hours as needed for nausea or vomiting. Reported on 09/21/2015     No current facility-administered medications for this visit.     PHYSICAL EXAMINATION: ECOG PERFORMANCE STATUS: 1 - Symptomatic but completely ambulatory  Vitals:   07/14/16 1315  BP: (!) 168/82  Pulse: 89  Resp: 18  Temp: 98.1 F (36.7 C)   Filed Weights    07/14/16 1315  Weight: 151 lb 6.4 oz (68.7 kg)    GENERAL:alert, no distress and comfortable SKIN: skin color, texture, turgor are normal, no rashes or significant lesions EYES: normal, Conjunctiva are pink and non-injected, sclera clear Musculoskeletal:no cyanosis of digits and no clubbing  NEURO: alert & oriented x 3 with fluent speech, no focal motor/sensory deficits  LABORATORY DATA:  I have reviewed the data as listed    Component Value Date/Time   NA 139 07/14/2016 1225   K 4.3 07/14/2016 1225   CL 108 02/17/2016 0357   CO2 23 07/14/2016 1225   GLUCOSE 83 07/14/2016 1225   BUN 7.2 07/14/2016 1225   CREATININE 0.6 07/14/2016 1225   CALCIUM 8.8 07/14/2016 1225   PROT 6.8 07/14/2016 1225   ALBUMIN 3.8 07/14/2016 1225   AST 17 07/14/2016 1225   ALT 16 07/14/2016 1225   ALKPHOS 47 07/14/2016 1225   BILITOT <0.22 07/14/2016 1225  GFRNONAA >60 02/17/2016 0357   GFRAA >60 02/17/2016 0357    No results found for: SPEP, UPEP  Lab Results  Component Value Date   WBC 2.7 (L) 07/14/2016   NEUTROABS 1.7 07/14/2016   HGB 11.3 (L) 07/14/2016   HCT 32.3 (L) 07/14/2016   MCV 97.0 07/14/2016   PLT 163 07/14/2016      Chemistry      Component Value Date/Time   NA 139 07/14/2016 1225   K 4.3 07/14/2016 1225   CL 108 02/17/2016 0357   CO2 23 07/14/2016 1225   BUN 7.2 07/14/2016 1225   CREATININE 0.6 07/14/2016 1225      Component Value Date/Time   CALCIUM 8.8 07/14/2016 1225   ALKPHOS 47 07/14/2016 1225   AST 17 07/14/2016 1225   ALT 16 07/14/2016 1225   BILITOT <0.22 07/14/2016 1225      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 The patient appears to be interested to go back to [facility] after the new year to see a Neurosurgeon there. We discussed cancer survivorship. She is interested for referral to neuropsychiatrist, cancer  survivorship clinic and palliative care clinic I plan to repeat MRI as scheduled in February 2018  Pancytopenia due to antineoplastic chemotherapy [facility]) This is likely due to recent treatment. The patient denies recent history of bleeding such as epistaxis, hematuria or hematochezia. No recent infection. She is asymptomatic from the anemia and low white blood cell count. I will observe for now.    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 She will continue anti-seizure medications as prescribed  Neuropathic pain She has chronic headache which is multifactorial, related to prior resection with neuropathic pain component We will continue conservative management with her current medications.  I refilled prescription for pain medications and other supportive care medications  Cognitive impairment She has significant cognitive impairment, poor memory and difficulties with coping related to recurrent seizures. She has appointment to see neuropsychiatrist next week. We discussed some common strategies to help her retain memory by writing things down.   Orders Placed This Encounter  Procedures  . MR BRAIN W WO CONTRAST    Standing Status:   Future    Standing Expiration Date:   08/18/2017    Scheduling Instructions:     Morning scan before 12 pm, please    Order Specific Question:   Reason for exam:    Answer:   brain tumor for staging    Order Specific Question:   Preferred imaging location?    Answer:   [facility] (table limit-350 lbs)    Order Specific Question:   Does the patient have a pacemaker or implanted devices?    Answer:   No   All questions were answered. The patient knows to call the clinic with any problems, questions or concerns. No barriers to learning was detected. I spent 20 minutes counseling the patient face to face. The total time spent in the appointment was 25 minutes and more than 50% was on counseling and review of test results     Heath Lark, MD 07/14/2016 4:41 PM

## 2016-07-14 NOTE — Assessment & Plan Note (Signed)
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 She has appointment to see neuropsychiatrist next week. We discussed some common strategies to help her retain memory by writing things down.

## 2016-07-14 NOTE — Telephone Encounter (Signed)
Appointments scheduled per 07/14/16 los. Patient was given a copy of the AVS report and appointment schedule, per 07/14/16 los.

## 2016-07-14 NOTE — Assessment & Plan Note (Addendum)
The MRIs show stable disease with no changes. The patient appears to be interested to go back to [facility] after the new year to see a Neurosurgeon there. We discussed cancer survivorship. She is interested for referral to neuropsychiatrist, cancer survivorship clinic and palliative care clinic I plan to repeat MRI as scheduled in February 2018

## 2016-07-14 NOTE — Assessment & Plan Note (Signed)
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  I refilled prescription for pain medications and other supportive care medications

## 2016-07-15 MED FILL — MONTELUKAST SOD 10 MG TAB: 10 | 30 days supply | Qty: 30 | Fill #5

## 2016-07-15 MED FILL — LEVOCETIRIZINE 5 MG TABLET: 5 | 30 days supply | Qty: 30 | Fill #5

## 2016-07-19 ENCOUNTER — Ambulatory Visit (INDEPENDENT_AMBULATORY_CARE_PROVIDER_SITE_OTHER): Payer: Medicare Other | Admitting: Psychology

## 2016-07-19 ENCOUNTER — Encounter: Payer: Self-pay | Admitting: Psychology

## 2016-07-19 DIAGNOSIS — C711 Malignant neoplasm of frontal lobe: Secondary | ICD-10-CM | POA: Diagnosis not present

## 2016-07-19 DIAGNOSIS — G40109 Localization-related (focal) (partial) symptomatic epilepsy and epileptic syndromes with simple partial seizures, not intractable, without status epilepticus: Secondary | ICD-10-CM

## 2016-07-19 DIAGNOSIS — R413 Other amnesia: Secondary | ICD-10-CM
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
## 2016-07-20 MED FILL — HYDROCODON-APAP 10-325: 10-325 | 22 days supply | Qty: 90 | Fill #0

## 2016-07-20 MED FILL — BUTALB-ACETAMIN-CAFF 50-325: 50-325-40 | 22 days supply | Qty: 90 | Fill #0

## 2016-07-22 MED FILL — GABAPENTIN 300 MG CAPSULE: 300 | 90 days supply | Qty: 540 | Fill #1

## 2016-07-25 DIAGNOSIS — Z20828 Contact with and (suspected) exposure to other viral communicable diseases: Secondary | ICD-10-CM | POA: Diagnosis not present

## 2016-07-25 DIAGNOSIS — R05 Cough: Secondary | ICD-10-CM | POA: Diagnosis not present

## 2016-07-25 DIAGNOSIS — J0100 Acute maxillary sinusitis, unspecified: Secondary | ICD-10-CM | POA: Diagnosis not present

## 2016-07-25 MED FILL — OSELTAMIVIR PHOS 75 MG CAP: 75 | 5 days supply | Qty: 10 | Fill #0

## 2016-07-25 MED FILL — AMOX TR-K CLV 875-125 MG TA: 875-125 | 10 days supply | Qty: 20 | Fill #0

## 2016-07-25 MED FILL — BENZONATATE 200 MG CAPSULE: 200 | 7 days supply | Qty: 21 | Fill #0

## 2016-07-27 ENCOUNTER — Ambulatory Visit (INDEPENDENT_AMBULATORY_CARE_PROVIDER_SITE_OTHER): Payer: Medicare Other | Admitting: Psychology

## 2016-07-27 DIAGNOSIS — C711 Malignant neoplasm of frontal lobe: Secondary | ICD-10-CM

## 2016-07-27 DIAGNOSIS — M79644 Pain in right finger(s): Secondary | ICD-10-CM | POA: Diagnosis not present

## 2016-07-27 DIAGNOSIS — R413 Other amnesia: Secondary | ICD-10-CM

## 2016-07-27 DIAGNOSIS — J0100 Acute maxillary sinusitis, unspecified: Secondary | ICD-10-CM | POA: Diagnosis not present

## 2016-07-27 DIAGNOSIS — R05 Cough: Secondary | ICD-10-CM | POA: Diagnosis not present

## 2016-07-27 DIAGNOSIS — G40109 Localization-related (focal) (partial) symptomatic epilepsy and epileptic syndromes with simple partial seizures, not intractable, without status epilepticus: Secondary | ICD-10-CM

## 2016-07-27 NOTE — Progress Notes (Signed)
   Neuropsychology Note  Victoria Bates returned today for 4 hours of neuropsychological testing with technician, Milana Kidney, BS, under the supervision of Dr. Macarthur Critchley. The patient did not appear overtly distressed by the testing session, per behavioral observation or via self-report to the technician. Rest breaks were offered. Victoria Bates will return within 2 weeks for a feedback session with Dr. Si Raider at which time her test performances, clinical impressions and treatment recommendations will be reviewed in detail. The patient understands she can contact our office should she require our assistance before this time.  Full report to follow.

## 2016-07-28 MED FILL — HYDROCODONE-CHLORPHENIRAM S: 10-8 | 7 days supply | Qty: 70 | Fill #0

## 2016-07-29 ENCOUNTER — Telehealth: Payer: Self-pay

## 2016-07-29 DIAGNOSIS — C711 Malignant neoplasm of frontal lobe: Secondary | ICD-10-CM

## 2016-07-29 DIAGNOSIS — R202 Paresthesia of skin: Secondary | ICD-10-CM

## 2016-07-29 DIAGNOSIS — R0789 Other chest pain: Secondary | ICD-10-CM

## 2016-07-29 DIAGNOSIS — R51 Headache: Secondary | ICD-10-CM

## 2016-07-29 DIAGNOSIS — R110 Nausea: Secondary | ICD-10-CM

## 2016-07-29 DIAGNOSIS — R519 Headache, unspecified: Secondary | ICD-10-CM

## 2016-07-29 DIAGNOSIS — R4702 Dysphasia: Secondary | ICD-10-CM

## 2016-07-29 DIAGNOSIS — M79644 Pain in right finger(s): Secondary | ICD-10-CM | POA: Diagnosis not present

## 2016-07-29 MED ORDER — CARISOPRODOL 350 MG PO TABS: ORAL_TABLET | ORAL | 3 refills | Status: DC

## 2016-07-29 MED FILL — LORazepam 0.5 MG TABS: 0.5 | 30 days supply | Qty: 90 | Fill #0

## 2016-07-29 NOTE — Telephone Encounter (Signed)
Informed pt Dr. Alvy Bimler ok to refill Soma.  The only issue has been when it requires a Prior Auth, then her Neurosurgeon needs to answer the PA questions about why it is prescribed for her seizures.  Pt says if it needs PA this time she will call her Neurosurgeon's office.  She asks for Rx to be printed since she may need to take it to another pharmacy over the weekend to fill. [facility] is not opened on the weekends.  Informed pt Rx will be ready to pick up at our office soon. Pt verbalized understanding. Pt also wanted Dr. Alvy Bimler to know she has been treated for Strep B at her PCP office this week.  Pt could not tolerate Augmentin d/t GI side effects so she ended up getting a shot of PCN in their office.   Dr. Alvy Bimler notified.

## 2016-07-29 NOTE — Telephone Encounter (Signed)
Pt was requesting a refill on her SOMA, she was not sure if Dr Alvy Bimler will fill it. She is requesting a call when ready for pick up at [phone].

## 2016-07-29 NOTE — Telephone Encounter (Signed)
Cameo, I know we had problems with SOMA refills in the past Can we help her or does she have to call her other doctor?

## 2016-07-31 MED FILL — CARISOPRODOL 350 MG TABLET: 350 | 23 days supply | Qty: 90 | Fill #3

## 2016-08-04 ENCOUNTER — Encounter: Payer: Medicaid Other | Admitting: Psychology

## 2016-08-05 ENCOUNTER — Other Ambulatory Visit: Payer: Self-pay | Admitting: *Deleted

## 2016-08-05 MED ORDER — MORPHINE SULFATE 30 MG PO TABS: 30.0000 mg | ORAL_TABLET | ORAL | 0 refills | Status: DC | PRN

## 2016-08-05 MED FILL — MORPHINE SULFATE IR 30 MG T: 30 | 15 days supply | Qty: 90 | Fill #0

## 2016-08-08 ENCOUNTER — Encounter: Payer: Medicaid Other | Admitting: Psychology

## 2016-08-09 ENCOUNTER — Telehealth: Payer: Self-pay

## 2016-08-09 MED ORDER — BUTALBITAL-APAP-CAFFEINE 50-325-40 MG PO TABS: ORAL_TABLET | ORAL | 0 refills | Status: DC

## 2016-08-09 MED ORDER — HYDROCODONE-ACETAMINOPHEN 10-325 MG PO TABS: 1.0000 | ORAL_TABLET | Freq: Four times a day (QID) | ORAL | 0 refills | Status: DC | PRN

## 2016-08-09 NOTE — Telephone Encounter (Signed)
Patient called to request refills on Fioricet and Hydrocodone. Fioricet called in to pharmacy. Dr. Alvy Bimler will complete script for hydrocodone on 08/10/2016. Patient notified via phone. Verbalized understanding.

## 2016-08-10 ENCOUNTER — Telehealth: Payer: Self-pay | Admitting: Hematology and Oncology

## 2016-08-10 MED FILL — BUTALB-ACETAMIN-CAFF 50-325: 50-325-40 | 23 days supply | Qty: 90 | Fill #0

## 2016-08-10 MED FILL — HYDROCODON-APAP 10-325: 10-325 | 22 days supply | Qty: 90 | Fill #0

## 2016-08-10 NOTE — Telephone Encounter (Signed)
Pt called to r/s lab/flush/md appt to 2/2 and 2/5 due to moving her MRI appt to 2/2. Gave pt new appt date/times per request

## 2016-08-12 DIAGNOSIS — R1084 Generalized abdominal pain: Secondary | ICD-10-CM | POA: Diagnosis not present

## 2016-08-12 DIAGNOSIS — R195 Other fecal abnormalities: Secondary | ICD-10-CM | POA: Diagnosis not present

## 2016-08-12 DIAGNOSIS — K921 Melena: Secondary | ICD-10-CM | POA: Diagnosis not present

## 2016-08-12 DIAGNOSIS — R55 Syncope and collapse: Secondary | ICD-10-CM | POA: Diagnosis not present

## 2016-08-12 NOTE — Progress Notes (Signed)
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 may also benefit from additional behavioral strategies to compensate for cognitive deficits; these will be reviewed with her. I do not have evidence to suggest that there is a history of primary psychiatric disorder, but the patient is endorsing a severe level of anxiety and psychological distress at the present time, related to coping with her medical condition and inability to function as she previously did. The patient may benefit from working with a mental health practitioner well versed in treating cancer survivors in an action-oriented way with a focus on stress management and problem-solving as opposed to emotional support for depression/grief. She may also benefit from working with a cancer survivorship mentor with the same type of approach. I believe the patient when she says she accepts the diagnosis/prognosis and does not have anxiety/fear/depression related to her death; therefore, these issues do not likely need to be a major focus of counseling if she pursues it in the future.    Recommendations/Plan: Based on the findings of the present evaluation, the following recommendations are offered:  1. Trial of stimulant medication could be considered but should be monitored closely as it could increase  anxiety levels. She will speak to Dr. Alvy Bimler about this. 2. Individual counseling that is action-oriented and focused on anxiety (related to coping with her daily symptoms and others' reactions to her diagnosis/prognosis) is highly recommended. She has seen counselors in the past but felt that the emphasis of therapy was on depression when her issue is more stress management and learning how to cope with anxiety. I am unsure who might be a better fit for her outside of the [place]. There is a new  psychologist joining [facility] next month, and I will see if this type of work could be done with him/her. I will follow up with the patient once I know. 3. I agree that the patient cannot work and should continue to receive disability benefits due to neurologic, cognitive and psychological symptoms associated with her medical condition. 4. I reviewed behavioral strategies to compensate for attention/memory/executive functioning deficits in daily life with the patient. Written information was provided. I encouraged her to review this information with her family, as well, because they need to understand that it will take her more time and effort to complete tasks that were once easy for her. We spoke at length about the need to make changes in what she is responsible for the family, and ways her family can rely less on her and more on themselves for daily tasks.  5. I also provided information regarding how stress impacts cognitive functioning and some stress management strategies. Written information was provided.   Feedback to Patient: Victoria Bates returned for a feedback appointment on 08/15/2016 to review the results of her neuropsychological evaluation with this provider. 90 minutes face-to-face time was spent reviewing her test results, my impressions and my recommendations as detailed above.    Total time spent on this patient's case: [id number] unit for interview with psychologist; [phone] units of  testing by psychometrician under psychologist's supervision; [phone] units for medical record review, scoring of neuropsychological tests, interpretation of test results, preparation of this report, and review of results to the patient by psychologist.      Thank you for your referral of Victoria Bates. Please feel free to contact me if you have any questions or concerns regarding this report.

## 2016-08-15 ENCOUNTER — Encounter: Payer: Self-pay | Admitting: Psychology

## 2016-08-15 ENCOUNTER — Ambulatory Visit (INDEPENDENT_AMBULATORY_CARE_PROVIDER_SITE_OTHER): Payer: Medicare Other | Admitting: Psychology

## 2016-08-15 DIAGNOSIS — C711 Malignant neoplasm of frontal lobe: Secondary | ICD-10-CM | POA: Diagnosis not present

## 2016-08-15 DIAGNOSIS — R413 Other amnesia: Secondary | ICD-10-CM

## 2016-08-15 NOTE — Patient Instructions (Addendum)
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 Specifically, there is evidence of significant deficits in attention and executive functioning -- which likely lead to memory lapses in daily life. These deficits are likely due to multiple factors, including brain cancer in the frontal lobe, history of brain surgery, history of chemotherapy and anxiety/stress.  This evaluation also suggests that you are experiencing anxiety and stress related to the change in your daily functioning and related to coping with physical/cognitive symptoms of your illness.  As we discussed, it will be important for your family and others to understand that while you have many cognitive strengths (you engage in conversation intelligently, you reason well, you can perform tasks well on some days), you are experiencing difficulties with attention and memory encoding, which means that you have trouble getting new memories to "stick" (to transfer from short term to long term memory). As a result, you will have a tendency to forget some conversations/events after they have occurred. You may complete a task and not recall having done it later on. Just because you "look normal" does not mean that you are able to do everything you once did. You will need to continue to use strategies to compensate for these cognitive issues. We have reviewed these and I provided you with written information on them.   Additionally, a trial of stimulant medication could be considered but should be monitored closely as it could increase anxiety levels. You will follow up with Dr. Alvy Bimler about this.  As we discussed, it would be great if you could work with a counselor who is more action-oriented and problem-solving focused as you do NOT need therapy for depression/grief. I will contact you if the new psychologist over in [facility] provides this kind of counseling. The psychologist starts sometime next month.  Stress  management techniques and self-care are highly recommended. (I know you are used to being the caregiver for everyone else so this is difficult.)  Based on your test results, I agree that you cannot work and should continue to receive disability benefits due to neurologic, cognitive and psychological symptoms associated with your medical condition.

## 2016-08-19 ENCOUNTER — Encounter: Payer: Self-pay | Admitting: *Deleted

## 2016-08-19 ENCOUNTER — Other Ambulatory Visit (HOSPITAL_BASED_OUTPATIENT_CLINIC_OR_DEPARTMENT_OTHER): Payer: Medicare Other

## 2016-08-19 ENCOUNTER — Ambulatory Visit (HOSPITAL_COMMUNITY)
Admission: RE | Admit: 2016-08-19 | Discharge: 2016-08-19 | Disposition: A | Discharge status: Home / Self Care | Payer: Medicare Other | Source: Ambulatory Visit | Attending: Hematology and Oncology | Admitting: Hematology and Oncology

## 2016-08-19 ENCOUNTER — Ambulatory Visit (HOSPITAL_BASED_OUTPATIENT_CLINIC_OR_DEPARTMENT_OTHER): Payer: Medicare Other

## 2016-08-19 ENCOUNTER — Telehealth: Payer: Self-pay | Admitting: *Deleted

## 2016-08-19 DIAGNOSIS — C711 Malignant neoplasm of frontal lobe: Secondary | ICD-10-CM

## 2016-08-19 LAB — COMPREHENSIVE METABOLIC PANEL
ALT: 15 U/L (ref 0–55)
AST: 17 U/L (ref 5–34)
Albumin: 3.7 g/dL (ref 3.5–5.0)
Alkaline Phosphatase: 49 U/L (ref 40–150)
Anion Gap: 7 mEq/L (ref 3–11)
BUN: 8.1 mg/dL (ref 7.0–26.0)
CO2: 26 mEq/L (ref 22–29)
Calcium: 8.8 mg/dL (ref 8.4–10.4)
Chloride: 106 mEq/L (ref 98–109)
Creatinine: 0.6 mg/dL (ref 0.6–1.1)
EGFR: >90 mL/min/{1.73_m2} (ref >90)
Glucose: 95 mg/dl (ref 70–140)
Potassium: 4.4 mEq/L (ref 3.5–5.1)
Sodium: 139 mEq/L (ref 136–145)
Total Bilirubin: 0.25 mg/dL (ref 0.20–1.20)
Total Protein: 6.4 g/dL (ref 6.4–8.3)

## 2016-08-19 LAB — CBC WITH DIFFERENTIAL/PLATELET
BASO%: 0.4 % (ref 0.0–2.0)
Basophils Absolute: 0 10*3/uL (ref 0.0–0.1)
EOS%: 4.9 % (ref 0.0–7.0)
Eosinophils Absolute: 0.1 10*3/uL (ref 0.0–0.5)
HCT: 30.2 % — ABNORMAL LOW (ref 34.8–46.6)
HGB: 10.8 g/dL — ABNORMAL LOW (ref 11.6–15.9)
LYMPH%: 33.6 % (ref 14.0–49.7)
MCH: 33.5 pg (ref 25.1–34.0)
MCHC: 35.8 g/dL (ref 31.5–36.0)
MCV: 93.8 fL (ref 79.5–101.0)
MONO#: 0.2 10*3/uL (ref 0.1–0.9)
MONO%: 7.2 % (ref 0.0–14.0)
NEUT#: 1.2 10*3/uL — ABNORMAL LOW (ref 1.5–6.5)
NEUT%: 53.9 % (ref 38.4–76.8)
Platelets: 157 10*3/uL (ref 145–400)
RBC: 3.22 10*6/uL — ABNORMAL LOW (ref 3.70–5.45)
RDW: 11.5 % (ref 11.2–14.5)
WBC: 2.2 10*3/uL — ABNORMAL LOW (ref 3.9–10.3)
lymph#: 0.8 10*3/uL — ABNORMAL LOW (ref 0.9–3.3)

## 2016-08-19 MED ORDER — GADOBENATE DIMEGLUMINE 529 MG/ML IV SOLN
14.0000 mL | Freq: Once | INTRAVENOUS | Status: AC | PRN
  Administered 2016-08-19: 14 mL via INTRAVENOUS

## 2016-08-19 MED ORDER — SODIUM CHLORIDE 0.9 % IJ SOLN
10.0000 mL | INTRAMUSCULAR | Status: DC | PRN
  Administered 2016-08-19: 10 mL via INTRAVENOUS
  Filled 2016-08-19: qty 10

## 2016-08-19 NOTE — Progress Notes (Signed)
Patient requested to come back to [facility] following MRI today to have port de-accessed. Pt states last time they did not use Heparin in her port prior to de-accessing in radiology. She would like to have it done here. Appointment made for infusion nurse to de access port. Pt directed to report right to treatment area upon return.

## 2016-08-19 NOTE — Telephone Encounter (Signed)
Patient not arrived to infusion for port d/c. This RN called patient, she stated she let the tech in MRI deaccess her port; she verifies saline and heparin was used at time of deaccessed; pt appreciated call.

## 2016-08-19 NOTE — Progress Notes (Deleted)
Patient not arrived to infusion for port d/c. This RN called patient, she stated she let the tech in MRI deaccess her port; she verifies saline and heparin was used at time of deaccessed; pt appreciated call. No service given under this encounter.

## 2016-08-22 ENCOUNTER — Telehealth: Payer: Self-pay | Admitting: Hematology and Oncology

## 2016-08-22 ENCOUNTER — Other Ambulatory Visit: Payer: Self-pay | Admitting: *Deleted

## 2016-08-22 ENCOUNTER — Ambulatory Visit (HOSPITAL_BASED_OUTPATIENT_CLINIC_OR_DEPARTMENT_OTHER): Payer: Medicare Other | Admitting: Hematology and Oncology

## 2016-08-22 ENCOUNTER — Encounter: Payer: Self-pay | Admitting: Hematology and Oncology

## 2016-08-22 VITALS — BP 135/85 | HR 100 | Temp 98.6°F | Resp 17 | Ht 67.0 in | Wt 158.3 lb

## 2016-08-22 DIAGNOSIS — C711 Malignant neoplasm of frontal lobe: Secondary | ICD-10-CM | POA: Diagnosis not present

## 2016-08-22 DIAGNOSIS — R51 Headache: Secondary | ICD-10-CM

## 2016-08-22 DIAGNOSIS — R4702 Dysphasia: Secondary | ICD-10-CM

## 2016-08-22 DIAGNOSIS — R110 Nausea: Secondary | ICD-10-CM

## 2016-08-22 DIAGNOSIS — G44021 Chronic cluster headache, intractable: Secondary | ICD-10-CM

## 2016-08-22 DIAGNOSIS — R112 Nausea with vomiting, unspecified: Secondary | ICD-10-CM | POA: Diagnosis not present

## 2016-08-22 DIAGNOSIS — D61810 Antineoplastic chemotherapy induced pancytopenia: Secondary | ICD-10-CM | POA: Diagnosis not present

## 2016-08-22 DIAGNOSIS — K5792 Diverticulitis of intestine, part unspecified, without perforation or abscess without bleeding: Secondary | ICD-10-CM | POA: Insufficient documentation

## 2016-08-22 DIAGNOSIS — Z1211 Encounter for screening for malignant neoplasm of colon: Secondary | ICD-10-CM | POA: Diagnosis not present

## 2016-08-22 DIAGNOSIS — M792 Neuralgia and neuritis, unspecified: Secondary | ICD-10-CM | POA: Diagnosis not present

## 2016-08-22 DIAGNOSIS — K5733 Diverticulitis of large intestine without perforation or abscess with bleeding: Secondary | ICD-10-CM

## 2016-08-22 DIAGNOSIS — R0789 Other chest pain: Secondary | ICD-10-CM

## 2016-08-22 DIAGNOSIS — R202 Paresthesia of skin: Secondary | ICD-10-CM

## 2016-08-22 DIAGNOSIS — R519 Headache, unspecified: Secondary | ICD-10-CM

## 2016-08-22 DIAGNOSIS — R4189 Other symptoms and signs involving cognitive functions and awareness: Secondary | ICD-10-CM | POA: Diagnosis not present

## 2016-08-22 DIAGNOSIS — T451X5A Adverse effect of antineoplastic and immunosuppressive drugs, initial encounter: Secondary | ICD-10-CM

## 2016-08-22 MED ORDER — CIPROFLOXACIN HCL 250 MG PO TABS: 250.0000 mg | ORAL_TABLET | Freq: Two times a day (BID) | ORAL | 0 refills | Status: DC

## 2016-08-22 MED ORDER — BUTALBITAL-APAP-CAFFEINE 50-325-40 MG PO TABS: ORAL_TABLET | ORAL | 0 refills | Status: DC

## 2016-08-22 MED ORDER — LORAZEPAM 1 MG PO TABS: 1.0000 mg | ORAL_TABLET | Freq: Three times a day (TID) | ORAL | 0 refills | Status: DC | PRN

## 2016-08-22 MED ORDER — DRONABINOL 2.5 MG PO CAPS
2.5000 mg | ORAL_CAPSULE | Freq: Two times a day (BID) | ORAL | 0 refills | Status: DC
Start: 2016-08-22 — End: 2016-09-06

## 2016-08-22 MED ORDER — PROMETHAZINE HCL 25 MG PO TABS: 25.0000 mg | ORAL_TABLET | Freq: Four times a day (QID) | ORAL | 9 refills | Status: DC | PRN

## 2016-08-22 MED ORDER — METRONIDAZOLE 500 MG PO TABS: 500.0000 mg | ORAL_TABLET | Freq: Three times a day (TID) | ORAL | 0 refills | Status: DC

## 2016-08-22 MED ORDER — CARISOPRODOL 350 MG PO TABS: ORAL_TABLET | ORAL | 3 refills | Status: DC

## 2016-08-22 MED FILL — PROMETHAZINE 25 MG TABLET: 25 | 23 days supply | Qty: 90 | Fill #0

## 2016-08-22 MED FILL — LORazepam 1 MG TABS: 1 | 30 days supply | Qty: 90 | Fill #0

## 2016-08-22 MED FILL — CIPROFLOXACIN HCL 250 MG TA: 250 | 10 days supply | Qty: 20 | Fill #0

## 2016-08-22 MED FILL — metroNIDAZOLE 500 MG TABS: 500 | 7 days supply | Qty: 21 | Fill #0

## 2016-08-22 MED FILL — CARISOPRODOL 350 MG TABLET: 350 | 23 days supply | Qty: 90 | Fill #0

## 2016-08-22 NOTE — Assessment & Plan Note (Signed)
The patient takes Fioricet on a regular basis that appears to help with her headache. Unfortunately, her insurance company will not cover for it. We will try to get insurance authorization

## 2016-08-22 NOTE — Assessment & Plan Note (Signed)
She had recent abdominal cramping and bloody bowel movement. GI workup in progress. I recommend she avoid NSAID and aspirin. I recommend a trial of oral antibiotics with ciprofloxacin and Flagyl. I warned her about risk of nausea with these medications.

## 2016-08-22 NOTE — Assessment & Plan Note (Signed)
I reviewed the report from neuropsychiatric assessments. 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  

## 2016-08-22 NOTE — Assessment & Plan Note (Addendum)
She has extended chemotherapy induced nausea, vomiting with poor appetite. We discussed a trial of Marinol and she agreed to proceed I did warn her about possibility of risk of seizure and medication interaction

## 2016-08-22 NOTE — Assessment & Plan Note (Signed)
She has prolonged pancytopenia related to recent viral illness and from residual side effects from chemotherapy She had recent GI bleed She is asymptomatic from the anemia and low white blood cell count. I will observe for now.

## 2016-08-22 NOTE — Telephone Encounter (Signed)
Appointments scheduled per 08/22/16 los. Appointment date per patient. Patient was given a copy of the appointment schedule and AVS report, per 08/22/16 los.

## 2016-08-22 NOTE — Progress Notes (Signed)
[facility] OFFICE PROGRESS NOTE  Patient Care Team: Heath Lark, MD as PCP - General (Hematology and Oncology) Ashok Pall, MD as Consulting Physician (Neurosurgery) Burnell Blanks. Salomon Fick, MD as Consulting Physician (Neurosurgery) Burnell Blanks. 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      09/27/2013 Pathology Results    Accession: [id number] pathology showed grade 2 oligodendroglioma with mutant IDH1 protein, ATRX, OLIG 2 expression. p53 is negative, low Ki-67 labeling index. 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      11/06/2013 Imaging    Repeat CT scan of the head showed resolution of abscess.      01/23/2014 Surgery    Dr. Christella Noa perform a subtotal gross resection of the tumor.      01/24/2014 Imaging    Repeat imaging studies showed persistent and residual disease.      03/03/2014 - 09/01/2014 Chemotherapy    She was started on Temodar, 5 days every 28 days      03/13/2014 Imaging    Repeat MRI showed no evidence of progression of disease.      07/14/2014 Imaging    Repeat MRI showed no evidence of disease progression.      09/12/2014 Imaging    MRI of the liver show mild regression of the size of lesions, presumed to be benign      09/15/2014 Imaging    Repeat MRI of the brain show persistent abnormalities, suspicious for residual disease      03/02/2015 Imaging     MRI brain at [facility] show persistent abnormalities      08/10/2015 Imaging    Repeat MRI [facility] showed stable disease      08/24/2015 - 02/08/2016 Chemotherapy     She was started on Lomustine, Procarbazine and Vincristine chemotherapy      08/30/2015 Imaging    CT chest showed no evidence of aspiration pneumonia      11/18/2015 Imaging    MRI brain showed status post resection of right frontal lobe tumor without residual or recurrent tumor. 2. Otherwise negative MRI of the brain.       03/02/2016 Imaging    Stable MRI post right frontal lobe tumor resection. Negative for recurrent tumor      06/01/2016 Imaging    Stable MRI post right frontal lobe tumor resection. No recurrent tumor identified      08/19/2016 Imaging    Unchanged examination without tumor recurrence.       INTERVAL HISTORY: Please see below for problem oriented charting. She returns for further follow-up. She has multiple new symptoms including worsening headache especially with the cold weather, persistent peripheral neuropathy, recent abdominal cramping with passage of bloody diarrhea, nausea, poor appetite and anorexia. Denies worsening seizures. No focal neurological deficits. She had recent cold-like illness. She was prescribed Tamiflu because multiple family members were diagnosed with influenza.  REVIEW OF SYSTEMS:   Constitutional: Denies fevers, chills Eyes: Denies blurriness of vision Ears, nose, mouth, throat, and face: Denies mucositis or sore throat Respiratory: Denies cough, dyspnea or wheezes Cardiovascular: Denies palpitation, chest discomfort or lower  extremity swelling Skin: Denies abnormal skin rashes Lymphatics: Denies new lymphadenopathy or easy bruising Neurological:Denies numbness, tingling or new weaknesses Behavioral/Psych: Mood is stable, no new changes  All other systems were reviewed with the patient and are negative.  I have reviewed the past medical history, past surgical history, social history and family history with the patient and they are unchanged from previous note.  ALLERGIES:  is allergic to adhesive [tape]; latex; percocet  [oxycodone-acetaminophen]; shellfish allergy; skin adhesives; soma [carisoprodol]; tramadol hcl; and vancomycin.  MEDICATIONS:  Current Outpatient Prescriptions  Medication Sig Dispense Refill  . ALPRAZolam (XANAX) 1 MG tablet Take 1 mg by mouth daily as needed.    . butalbital-acetaminophen-caffeine (FIORICET, ESGIC) 50-325-40 MG tablet TAKE 1 TABLET BY MOUTH EVERY 6 HOURS AS NEEDED FOR HEADACHE 90 tablet 0  . carisoprodol (SOMA) 350 MG tablet TAKE 1 TABLET BY MOUTH FOUR TIMES DAILY FOR SEIZURE PREVENTION 90 tablet 3  . divalproex (DEPAKOTE ER) 250 MG 24 hr tablet Take 2 tablets twice a day 360 tablet 3  . gabapentin (NEURONTIN) 300 MG capsule TAKE 3 CAPSULES BY MOUTH IN THE MORNING AND 3 CAPSULES IN THE EVENING 540 capsule 3  . HYDROcodone-acetaminophen (NORCO) 10-325 MG tablet Take 1 tablet by mouth every 6 (six) hours as needed. 90 tablet 0  . levocetirizine (XYZAL) 5 MG tablet Take 1 tablet (5 mg total) by mouth every evening. 60 tablet 5  . lidocaine-prilocaine (EMLA) cream Apply 1 application topically as needed. 30 g 6  . LORazepam (ATIVAN) 1 MG tablet Take 1 tablet (1 mg total) by mouth every 8 (eight) hours as needed for anxiety or seizure. 90 tablet 0  . montelukast (SINGULAIR) 10 MG tablet Take 1 tablet (10 mg total) by mouth at bedtime. 30 tablet 5  . morphine (MSIR) 30 MG tablet Take 1 tablet (30 mg total) by mouth every 4 (four) hours as needed for severe pain. 90 tablet 0  . ondansetron (ZOFRAN ODT) 8 MG disintegrating tablet Take 1 tablet (8 mg total) by mouth every 8 (eight) hours as needed for nausea or vomiting. 90 tablet 3  . PRESCRIPTION MEDICATION Chemotherapy regimen. [facility]    . promethazine (PHENERGAN) 25 MG tablet Take 1 tablet (25 mg total) by mouth every 6 (six) hours as needed for nausea or vomiting. 90 tablet 9  . ciprofloxacin (CIPRO) 250 MG tablet Take 1 tablet (250 mg total) by mouth 2 (two) times daily. 20 tablet 0  . dronabinol (MARINOL) 2.5 MG capsule Take 1  capsule (2.5 mg total) by mouth 2 (two) times daily before a meal. 60 capsule 0  . GLEOSTINE 100 MG capsule TAKE 2 CAPSULES BY MOUTH ONCE. TAKE ON AN EMTPY STOMACH 1 HOUR BEFORE OR 2 HOURS AFTER MEALS. CAUTION:CHEMOTHERAPY (Patient not taking: Reported on 08/22/2016) 2 capsule 0  . metroNIDAZOLE (FLAGYL) 500 MG tablet Take 1 tablet (500 mg total) by mouth 3 (three) times daily. 21 tablet 0   No current facility-administered medications for this visit.     PHYSICAL EXAMINATION: ECOG PERFORMANCE STATUS: 1 - Symptomatic but completely ambulatory  Vitals:   08/22/16 1040  BP: 135/85  Pulse: 100  Resp: 17  Temp: 98.6 F (37 C)   Filed Weights   08/22/16 1040  Weight: 158 lb 4.8 oz (71.8 kg)    GENERAL:alert, no distress and comfortable SKIN: skin color, texture, turgor are normal, no rashes or significant lesions EYES: normal, Conjunctiva are pink and non-injected, sclera clear OROPHARYNX:no exudate, no erythema and lips,  buccal mucosa, and tongue normal  NECK: supple, thyroid normal size, non-tender, without nodularity LYMPH:  no palpable lymphadenopathy in the cervical, axillary or inguinal LUNGS: clear to auscultation and percussion with normal breathing effort HEART: regular rate & rhythm and no murmurs and no lower extremity edema ABDOMEN:abdomen soft, non-tender and normal bowel sounds Musculoskeletal:no cyanosis of digits and no clubbing  NEURO: alert & oriented x 3 with fluent speech, no focal motor/sensory deficits  LABORATORY DATA:  I have reviewed the data as listed    Component Value Date/Time   NA 139 08/19/2016 1429   K 4.4 08/19/2016 1429   CL 108 02/17/2016 0357   CO2 26 08/19/2016 1429   GLUCOSE 95 08/19/2016 1429   BUN 8.1 08/19/2016 1429   CREATININE 0.6 08/19/2016 1429   CALCIUM 8.8 08/19/2016 1429   PROT 6.4 08/19/2016 1429   ALBUMIN 3.7 08/19/2016 1429   AST 17 08/19/2016 1429   ALT 15 08/19/2016 1429   ALKPHOS 49 08/19/2016 1429   BILITOT 0.25  08/19/2016 1429   GFRNONAA >60 02/17/2016 0357   GFRAA >60 02/17/2016 0357    No results found for: SPEP, UPEP  Lab Results  Component Value Date   WBC 2.2 (L) 08/19/2016   NEUTROABS 1.2 (L) 08/19/2016   HGB 10.8 (L) 08/19/2016   HCT 30.2 (L) 08/19/2016   MCV 93.8 08/19/2016   PLT 157 08/19/2016      Chemistry      Component Value Date/Time   NA 139 08/19/2016 1429   K 4.4 08/19/2016 1429   CL 108 02/17/2016 0357   CO2 26 08/19/2016 1429   BUN 8.1 08/19/2016 1429   CREATININE 0.6 08/19/2016 1429      Component Value Date/Time   CALCIUM 8.8 08/19/2016 1429   ALKPHOS 49 08/19/2016 1429   AST 17 08/19/2016 1429   ALT 15 08/19/2016 1429   BILITOT 0.25 08/19/2016 1429       RADIOGRAPHIC STUDIES: I have personally reviewed the radiological images as listed and agreed with the findings in the report. Mr Jeri Cos Wo Contrast  Result Date: 08/19/2016 CLINICAL DATA:  Frontal lobe oligodendroglioma. EXAM: MRI HEAD WITHOUT AND WITH CONTRAST TECHNIQUE: Multiplanar, multiecho pulse sequences of the brain and surrounding structures were obtained without and with intravenous contrast. CONTRAST:  33m MULTIHANCE GADOBENATE DIMEGLUMINE 529 MG/ML IV SOLN COMPARISON:  Brain MRI 06/01/2016 FINDINGS: Brain: No focal diffusion restriction to indicate acute infarct. No intraparenchymal hemorrhage. Status post resection of right frontal lobe tumor. There is no peripheral nodular contrast enhancement. The small degree of surrounding hyperintense T2 weighted signal is unchanged. No mass effect or midline shift. No hydrocephalus or extra-axial fluid collection. The midline structures are normal. No age advanced or lobar predominant atrophy. Vascular: Major intracranial arterial and venous sinus flow voids are preserved. No evidence of chronic microhemorrhage or amyloid angiopathy. Skull and upper cervical spine: Remote right frontal craniotomy. Sinuses/Orbits: No fluid levels or advanced mucosal  thickening. No mastoid effusion. Normal orbits. IMPRESSION: Unchanged examination without tumor recurrence. Electronically Signed   By: KUlyses JarredM.D.   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 We discussed the risk and [facility] The patient decided she once a take a break. I recommend observation with surveillance imaging every 3 months. I will bring  her in every 6 weeks for port flushes and blood draw  Cognitive impairment I reviewed the report from neuropsychiatric assessments. The patient had significant memory impairment as a result of her prior surgery, her disease, her seizure medications and her recurrent seizures She appears to be coping well with writing down information and reminders   Pancytopenia due to antineoplastic chemotherapy [facility]) She has prolonged pancytopenia related to recent viral illness and from residual side effects from chemotherapy She had recent GI bleed She is asymptomatic from the anemia and low white blood cell count. I will observe for now.    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  I refilled prescription for pain medications and other supportive care medications  Intractable chronic cluster headache The patient takes Fioricet on a regular basis that appears to help with her headache. Unfortunately, her insurance company will not cover for it. We will try to get insurance authorization  Nausea & vomiting She has extended chemotherapy induced nausea, vomiting with poor appetite. We discussed a trial of Marinol and she agreed to proceed I did warn her about possibility of risk of  seizure and medication interaction  Diverticulitis She had recent abdominal cramping and bloody bowel movement. GI workup in progress. I recommend she avoid NSAID and aspirin. I recommend a trial of oral antibiotics with ciprofloxacin and Flagyl. I warned her about risk of nausea with these medications.   Orders Placed This Encounter  Procedures  . MR BRAIN W WO CONTRAST    Standing Status:   Future    Standing Expiration Date:   09/26/2017    Order Specific Question:   Reason for exam:    Answer:   brain tumor, assess for progression    Order Specific Question:   Preferred imaging location?    Answer:   [facility] (table limit-350 lbs)    Order Specific Question:   Does the patient have a pacemaker or implanted devices?    Answer:   No   All questions were answered. The patient knows to call the clinic with any problems, questions or concerns. No barriers to learning was detected. I spent 30 minutes counseling the patient face to face. The total time spent in the appointment was 40 minutes and more than 50% was on counseling and review of test results     Heath Lark, MD 08/22/2016 12:49 PM

## 2016-08-22 NOTE — Assessment & Plan Note (Signed)
The MRIs show stable disease with no changes. We discussed the risk and benefits of pursuing additional workup or assessment at [facility]. The patient decided she once a take a break. I recommend observation with surveillance imaging every 3 months. I will bring her in every 6 weeks for port flushes and blood draw

## 2016-08-22 NOTE — Assessment & Plan Note (Signed)
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  I refilled prescription for pain medications and other supportive care medications

## 2016-08-25 ENCOUNTER — Encounter: Payer: Self-pay | Admitting: Hematology and Oncology

## 2016-08-25 MED FILL — DRONABINOL 2.5 MG CAPSULE: 2.5 | 30 days supply | Qty: 60 | Fill #0

## 2016-08-25 NOTE — Progress Notes (Signed)
[employer] approved pt's Dronabinol from 08/25/16 to 02/22/17.

## 2016-08-30 ENCOUNTER — Telehealth: Payer: Self-pay

## 2016-08-30 ENCOUNTER — Telehealth: Payer: Self-pay | Admitting: *Deleted

## 2016-08-30 ENCOUNTER — Other Ambulatory Visit: Payer: Self-pay

## 2016-08-30 ENCOUNTER — Other Ambulatory Visit: Payer: Self-pay | Admitting: Hematology and Oncology

## 2016-08-30 MED ORDER — BUTALBITAL-APAP-CAFFEINE 50-325-40 MG PO TABS: ORAL_TABLET | ORAL | 0 refills | Status: DC

## 2016-08-30 MED ORDER — MORPHINE SULFATE 30 MG PO TABS: 30.0000 mg | ORAL_TABLET | ORAL | 0 refills | Status: DC | PRN

## 2016-08-30 MED ORDER — HYDROCODONE-ACETAMINOPHEN 10-325 MG PO TABS: 1.0000 | ORAL_TABLET | Freq: Four times a day (QID) | ORAL | 0 refills | Status: DC | PRN

## 2016-08-30 MED FILL — MORPHINE SULFATE IR 30 MG T: 30 | 15 days supply | Qty: 90 | Fill #0

## 2016-08-30 MED FILL — HYDROCODON-APAP 10-325: 10-325 | 23 days supply | Qty: 90 | Fill #0

## 2016-08-30 MED FILL — BUTALB-ACETAMIN-CAFF 50-325: 50-325-40 | 23 days supply | Qty: 90 | Fill #0

## 2016-08-30 NOTE — Telephone Encounter (Signed)
"  I'm going out of town to new [facility] Thursday.  I do not want to run out of medicines,  What is the status of the Fioricet order?  I need refills on Fioricet, hydrocodone and morphine.  I'll be in [place] today and would like to pick up prescriptions today."

## 2016-08-30 NOTE — Telephone Encounter (Signed)
Called patient and told her the below message. She stated that the bleeding is not really better. She said she that has a GI referral for bleeding and that she has is still having blood in her stools.

## 2016-08-30 NOTE — Telephone Encounter (Signed)
Rx for Fioricet called into [facility] outpatient pharmacy, patient notified that Rx called into pharmacy.

## 2016-08-30 NOTE — Telephone Encounter (Signed)
Victoria Bates, I can refill her hydrocodone and morphine but I could not do the prior authorization for Fioricet Is her bleeding better? If better, we can try the other one that Medicaid approves

## 2016-09-01 ENCOUNTER — Other Ambulatory Visit: Payer: Medicaid Other

## 2016-09-01 ENCOUNTER — Ambulatory Visit (HOSPITAL_COMMUNITY): Payer: Medicaid Other

## 2016-09-02 ENCOUNTER — Ambulatory Visit: Payer: Medicaid Other | Admitting: Hematology and Oncology

## 2016-09-06 ENCOUNTER — Other Ambulatory Visit: Payer: Self-pay | Admitting: Hematology and Oncology

## 2016-09-06 ENCOUNTER — Telehealth: Payer: Self-pay | Admitting: *Deleted

## 2016-09-06 MED ORDER — DRONABINOL 2.5 MG PO CAPS: 2.5000 mg | ORAL_CAPSULE | Freq: Three times a day (TID) | ORAL | 0 refills | Status: DC

## 2016-09-06 NOTE — Telephone Encounter (Signed)
Called patient

## 2016-09-06 NOTE — Telephone Encounter (Signed)
I ordered refill at [facility] Her insurance may not pay for it since it runs out sooner

## 2016-09-06 NOTE — Telephone Encounter (Signed)
Patient called and is requesting a new prescription of Marinol with a higher dose. Patient states that during the course of a week and half, she was taking more than 2x/daily due to nausea and upset stomach. Patient states that taking 3x/daily helped really made a difference. Patient states that she does not want to run out and is currently not home. Patient states that when she gets home she will call [facility] and inform us of how much she has left.

## 2016-09-07 ENCOUNTER — Telehealth: Payer: Self-pay | Admitting: *Deleted

## 2016-09-07 NOTE — Telephone Encounter (Signed)
Pt states her dronabinol will be covered if we prescribe: 5 mg BID, # 60  for 30 days  Can you make this change?

## 2016-09-08 ENCOUNTER — Encounter: Payer: Self-pay | Admitting: Hematology and Oncology

## 2016-09-08 ENCOUNTER — Other Ambulatory Visit: Payer: Self-pay | Admitting: Hematology and Oncology

## 2016-09-08 ENCOUNTER — Encounter: Payer: Self-pay | Admitting: *Deleted

## 2016-09-08 ENCOUNTER — Other Ambulatory Visit: Payer: Self-pay | Admitting: Allergy and Immunology

## 2016-09-08 DIAGNOSIS — M79644 Pain in right finger(s): Secondary | ICD-10-CM | POA: Diagnosis not present

## 2016-09-08 DIAGNOSIS — J4520 Mild intermittent asthma, uncomplicated: Secondary | ICD-10-CM

## 2016-09-08 DIAGNOSIS — S60444S External constriction of right ring finger, sequela: Secondary | ICD-10-CM | POA: Diagnosis not present

## 2016-09-08 DIAGNOSIS — T50905D Adverse effect of unspecified drugs, medicaments and biological substances, subsequent encounter: Secondary | ICD-10-CM

## 2016-09-08 DIAGNOSIS — W4904XA Ring or other jewelry causing external constriction, initial encounter: Secondary | ICD-10-CM | POA: Diagnosis not present

## 2016-09-08 MED ORDER — DRONABINOL 5 MG PO CAPS: 5.0000 mg | ORAL_CAPSULE | Freq: Two times a day (BID) | ORAL | 0 refills | Status: DC

## 2016-09-08 MED FILL — DRONABINOL 5 MG CAPSULE: 5 | 30 days supply | Qty: 60 | Fill #0

## 2016-09-08 MED FILL — CARISOPRODOL 350 MG TABLET: 350 | 23 days supply | Qty: 90 | Fill #1

## 2016-09-08 MED FILL — MONTELUKAST SOD 10 MG TAB: 10 | 30 days supply | Qty: 30 | Fill #0

## 2016-09-08 NOTE — Progress Notes (Signed)
Rcvd letter from [facility] that Hydrocodone does not require prior authorization.  Medication has a quantity limit of 360 tabs per 30 days (12 tablets daily), however the member is not outside of those limits.

## 2016-09-08 NOTE — Telephone Encounter (Signed)
Patient notified, new script for marinol left at front for patient p/u

## 2016-09-08 NOTE — Telephone Encounter (Signed)
Yes Please cancel prior prescription of 2.5 mg TID

## 2016-09-13 DIAGNOSIS — J069 Acute upper respiratory infection, unspecified: Secondary | ICD-10-CM | POA: Diagnosis not present

## 2016-09-13 MED FILL — LEVOCETIRIZINE 5 MG TABLET: 5 | 30 days supply | Qty: 30 | Fill #0

## 2016-09-13 MED FILL — HYDROCODONE-CHLORPHENIRAM S: 10-8 | 24 days supply | Qty: 120 | Fill #0

## 2016-09-13 MED FILL — AZITHROMYCIN 250 MG TABLET: 250 | 5 days supply | Qty: 6 | Fill #0

## 2016-09-13 MED FILL — PROAIR HFA 90 MCG INHALER: 108 (90 BAS | 25 days supply | Qty: 9 | Fill #0

## 2016-09-19 ENCOUNTER — Ambulatory Visit: Payer: Medicaid Other | Admitting: Neurology

## 2016-09-20 ENCOUNTER — Other Ambulatory Visit: Payer: Self-pay | Admitting: *Deleted

## 2016-09-20 MED ORDER — HYDROCODONE-ACETAMINOPHEN 10-325 MG PO TABS: 1.0000 | ORAL_TABLET | Freq: Four times a day (QID) | ORAL | 0 refills | Status: DC | PRN

## 2016-09-20 MED ORDER — BUTALBITAL-APAP-CAFFEINE 50-325-40 MG PO TABS: ORAL_TABLET | ORAL | 0 refills | Status: DC

## 2016-09-21 MED FILL — BUTALB-ACETAMIN-CAFF 50-325: 50-325-40 | 15 days supply | Qty: 90 | Fill #0

## 2016-09-21 MED FILL — HYDROCODON-APAP 10-325: 10-325 | 23 days supply | Qty: 90 | Fill #0

## 2016-09-27 ENCOUNTER — Other Ambulatory Visit: Payer: Self-pay | Admitting: Hematology and Oncology

## 2016-09-27 ENCOUNTER — Other Ambulatory Visit: Payer: Self-pay | Admitting: Allergy and Immunology

## 2016-09-27 DIAGNOSIS — T50905D Adverse effect of unspecified drugs, medicaments and biological substances, subsequent encounter: Secondary | ICD-10-CM

## 2016-09-27 DIAGNOSIS — J4520 Mild intermittent asthma, uncomplicated: Secondary | ICD-10-CM

## 2016-09-27 MED FILL — LIDOCAINE-PRILOCAINE CREAM: 2.5-2.5 | 30 days supply | Qty: 30 | Fill #0

## 2016-09-28 NOTE — Telephone Encounter (Signed)
Patient was given a refill last month. Patient needs office visit for further refills.

## 2016-09-29 MED FILL — CARISOPRODOL 350 MG TABLET: 350 | 23 days supply | Qty: 90 | Fill #2

## 2016-09-30 ENCOUNTER — Other Ambulatory Visit: Payer: Self-pay | Admitting: Hematology and Oncology

## 2016-09-30 DIAGNOSIS — C711 Malignant neoplasm of frontal lobe: Secondary | ICD-10-CM

## 2016-10-03 ENCOUNTER — Telehealth: Payer: Self-pay | Admitting: *Deleted

## 2016-10-03 ENCOUNTER — Ambulatory Visit (HOSPITAL_BASED_OUTPATIENT_CLINIC_OR_DEPARTMENT_OTHER): Payer: Medicare Other

## 2016-10-03 ENCOUNTER — Other Ambulatory Visit: Payer: Self-pay | Admitting: Hematology and Oncology

## 2016-10-03 ENCOUNTER — Other Ambulatory Visit (HOSPITAL_BASED_OUTPATIENT_CLINIC_OR_DEPARTMENT_OTHER): Payer: Medicare Other

## 2016-10-03 VITALS — BP 147/90 | HR 88 | Temp 97.7°F | Resp 17

## 2016-10-03 DIAGNOSIS — C711 Malignant neoplasm of frontal lobe: Secondary | ICD-10-CM

## 2016-10-03 DIAGNOSIS — Z452 Encounter for adjustment and management of vascular access device: Secondary | ICD-10-CM | POA: Diagnosis not present

## 2016-10-03 LAB — CBC WITH DIFFERENTIAL/PLATELET
BASO%: 1.2 % (ref 0.0–2.0)
Basophils Absolute: 0 10*3/uL (ref 0.0–0.1)
EOS%: 1.2 % (ref 0.0–7.0)
Eosinophils Absolute: 0 10*3/uL (ref 0.0–0.5)
HCT: 34.5 % — ABNORMAL LOW (ref 34.8–46.6)
HGB: 11.9 g/dL (ref 11.6–15.9)
LYMPH%: 27 % (ref 14.0–49.7)
MCH: 33.5 pg (ref 25.1–34.0)
MCHC: 34.6 g/dL (ref 31.5–36.0)
MCV: 96.8 fL (ref 79.5–101.0)
MONO#: 0.3 10*3/uL (ref 0.1–0.9)
MONO%: 10.7 % (ref 0.0–14.0)
NEUT#: 1.4 10*3/uL — ABNORMAL LOW (ref 1.5–6.5)
NEUT%: 59.9 % (ref 38.4–76.8)
Platelets: 169 10*3/uL (ref 145–400)
RBC: 3.57 10*6/uL — ABNORMAL LOW (ref 3.70–5.45)
RDW: 11.9 % (ref 11.2–14.5)
WBC: 2.4 10*3/uL — ABNORMAL LOW (ref 3.9–10.3)
lymph#: 0.6 10*3/uL — ABNORMAL LOW (ref 0.9–3.3)

## 2016-10-03 LAB — COMPREHENSIVE METABOLIC PANEL
ALT: 14 U/L (ref 0–55)
AST: 15 U/L (ref 5–34)
Albumin: 4.1 g/dL (ref 3.5–5.0)
Alkaline Phosphatase: 52 U/L (ref 40–150)
Anion Gap: 9 mEq/L (ref 3–11)
BUN: 5.5 mg/dL — ABNORMAL LOW (ref 7.0–26.0)
CO2: 24 mEq/L (ref 22–29)
Calcium: 9.2 mg/dL (ref 8.4–10.4)
Chloride: 107 mEq/L (ref 98–109)
Creatinine: 0.7 mg/dL (ref 0.6–1.1)
EGFR: >90 mL/min/{1.73_m2} (ref >90)
Glucose: 90 mg/dl (ref 70–140)
Potassium: 4.1 mEq/L (ref 3.5–5.1)
Sodium: 140 mEq/L (ref 136–145)
Total Bilirubin: 0.25 mg/dL (ref 0.20–1.20)
Total Protein: 6.9 g/dL (ref 6.4–8.3)

## 2016-10-03 MED ORDER — HEPARIN SOD (PORK) LOCK FLUSH 100 UNIT/ML IV SOLN
500.0000 [IU] | Freq: Once | INTRAVENOUS | Status: AC | PRN
  Administered 2016-10-03: 500 [IU] via INTRAVENOUS
  Filled 2016-10-03: qty 5

## 2016-10-03 MED ORDER — SODIUM CHLORIDE 0.9 % IJ SOLN
10.0000 mL | INTRAMUSCULAR | Status: DC | PRN
  Administered 2016-10-03: 10 mL via INTRAVENOUS
  Filled 2016-10-03: qty 10

## 2016-10-03 MED FILL — DRONABINOL 5 MG CAPSULE: 5 | 30 days supply | Qty: 60 | Fill #0

## 2016-10-03 NOTE — Patient Instructions (Signed)
Implanted Port Home Guide An implanted port is a type of central line that is placed under the skin. Central lines are used to provide IV access when treatment or nutrition needs to be given through a person's veins. Implanted ports are used for long-term IV access. An implanted port may be placed because:  You need IV medicine that would be irritating to the small veins in your hands or arms.  You need long-term IV medicines, such as antibiotics.  You need IV nutrition for a long period.  You need frequent blood draws for lab tests.  You need dialysis.  Implanted ports are usually placed in the chest area, but they can also be placed in the upper arm, the abdomen, or the leg. An implanted port has two main parts:  Reservoir. The reservoir is round and will appear as a small, raised area under your skin. The reservoir is the part where a needle is inserted to give medicines or draw blood.  Catheter. The catheter is a thin, flexible tube that extends from the reservoir. The catheter is placed into a large vein. Medicine that is inserted into the reservoir goes into the catheter and then into the vein.  How will I care for my incision site? Do not get the incision site wet. Bathe or shower as directed by your health care provider. How is my port accessed? Special steps must be taken to access the port:  Before the port is accessed, a numbing cream can be placed on the skin. This helps numb the skin over the port site.  Your health care provider uses a sterile technique to access the port. ? Your health care provider must put on a mask and sterile gloves. ? The skin over your port is cleaned carefully with an antiseptic and allowed to dry. ? The port is gently pinched between sterile gloves, and a needle is inserted into the port.  Only "non-coring" port needles should be used to access the port. Once the port is accessed, a blood return should be checked. This helps ensure that the port  is in the vein and is not clogged.  If your port needs to remain accessed for a constant infusion, a clear (transparent) bandage will be placed over the needle site. The bandage and needle will need to be changed every week, or as directed by your health care provider.  Keep the bandage covering the needle clean and dry. Do not get it wet. Follow your health care provider's instructions on how to take a shower or bath while the port is accessed.  If your port does not need to stay accessed, no bandage is needed over the port.  What is flushing? Flushing helps keep the port from getting clogged. Follow your health care provider's instructions on how and when to flush the port. Ports are usually flushed with saline solution or a medicine called heparin. The need for flushing will depend on how the port is used.  If the port is used for intermittent medicines or blood draws, the port will need to be flushed: ? After medicines have been given. ? After blood has been drawn. ? As part of routine maintenance.  If a constant infusion is running, the port may not need to be flushed.  How long will my port stay implanted? The port can stay in for as long as your health care provider thinks it is needed. When it is time for the port to come out, surgery will be   done to remove it. The procedure is similar to the one performed when the port was put in. When should I seek immediate medical care? When you have an implanted port, you should seek immediate medical care if:  You notice a bad smell coming from the incision site.  You have swelling, redness, or drainage at the incision site.  You have more swelling or pain at the port site or the surrounding area.  You have a fever that is not controlled with medicine.  This information is not intended to replace advice given to you by your health care provider. Make sure you discuss any questions you have with your health care provider. Document  Released: 07/04/2005 Document Revised: 12/10/2015 Document Reviewed: 03/11/2013 Elsevier Interactive Patient Education  2017 [employer]  

## 2016-10-03 NOTE — Telephone Encounter (Signed)
-----   Message from Heath Lark, MD sent at 10/03/2016  1:40 PM EDT ----- Regarding: Labs better pls let her know labs better ----- Message ----- From: Interface, Lab In Three Zero One Sent: 10/03/2016  11:19 AM To: Heath Lark, MD

## 2016-10-03 NOTE — Telephone Encounter (Signed)
Notified of message below

## 2016-10-06 ENCOUNTER — Encounter: Payer: Self-pay | Admitting: Hematology and Oncology

## 2016-10-10 ENCOUNTER — Other Ambulatory Visit: Payer: Self-pay | Admitting: Hematology and Oncology

## 2016-10-10 ENCOUNTER — Other Ambulatory Visit: Payer: Self-pay | Admitting: *Deleted

## 2016-10-10 ENCOUNTER — Telehealth: Payer: Self-pay | Admitting: *Deleted

## 2016-10-10 DIAGNOSIS — D539 Nutritional anemia, unspecified: Secondary | ICD-10-CM | POA: Insufficient documentation

## 2016-10-10 DIAGNOSIS — D61818 Other pancytopenia: Secondary | ICD-10-CM | POA: Insufficient documentation

## 2016-10-10 MED ORDER — HYDROCODONE-ACETAMINOPHEN 10-325 MG PO TABS: 1.0000 | ORAL_TABLET | Freq: Four times a day (QID) | ORAL | 0 refills | Status: DC | PRN

## 2016-10-10 MED ORDER — BUTALBITAL-APAP-CAFFEINE 50-325-40 MG PO TABS: ORAL_TABLET | ORAL | 0 refills | Status: DC

## 2016-10-10 MED ORDER — LORAZEPAM 1 MG PO TABS: 1.0000 mg | ORAL_TABLET | Freq: Three times a day (TID) | ORAL | 0 refills | Status: DC | PRN

## 2016-10-10 NOTE — Telephone Encounter (Signed)
Notified of message below

## 2016-10-10 NOTE — Telephone Encounter (Signed)
Pt called requesting refills of lorazepam, fiorcet and hydrocodone. She states is not needing lorazepam as often since she has started the marinol. Is not using hydrocodone as often.   Wants to know what else she can do to increase lab values...  Also is asking about a 1 page insurance form she brought in 1 month ago to be signed.

## 2016-10-10 NOTE — Telephone Encounter (Signed)
Please refill/print scripts She can try to take OTC vitamin B12 1000 mcg PO daily I can sign her insurance form if needed.

## 2016-10-11 MED FILL — LEVOCETIRIZINE 5 MG TABLET: 5 | 30 days supply | Qty: 30 | Fill #1

## 2016-10-11 MED FILL — HYDROCODON-APAP 10-325: 10-325 | 23 days supply | Qty: 90 | Fill #0

## 2016-10-11 MED FILL — LORazepam 1 MG TABS: 1 | 30 days supply | Qty: 90 | Fill #0

## 2016-10-11 MED FILL — BUTALB-ACETAMIN-CAFF 50-325: 50-325-40 | 23 days supply | Qty: 90 | Fill #0

## 2016-10-17 ENCOUNTER — Telehealth: Payer: Self-pay

## 2016-10-17 NOTE — Telephone Encounter (Signed)
Pt called to say that her insurance will only cover marinol BID (#60) per month. She is asking if there is a way to go up on the milligrams so she only needs to take it BID. Dr Alvy Bimler had increased her to TID.

## 2016-10-17 NOTE — Telephone Encounter (Signed)
5 mg BID is the standard

## 2016-10-19 MED FILL — CARISOPRODOL 350 MG TABLET: 350 | 22 days supply | Qty: 90 | Fill #3

## 2016-10-20 ENCOUNTER — Telehealth: Payer: Self-pay

## 2016-10-20 NOTE — Telephone Encounter (Signed)
Returned call to patient about changing up coming appt to 11-14-16 so Dr. Alvy Bimler could fill out insurance exception form for Marinol.  Patient states that she could do that day. But, she will be out of Marinol on 11-04-16 and she could see Dr. Alvy Bimler earlier. She is worried that she will run out of Marinol.

## 2016-10-21 ENCOUNTER — Encounter: Payer: Self-pay | Admitting: Hematology and Oncology

## 2016-10-21 NOTE — Telephone Encounter (Signed)
We can refill current dose of marinol at 5 mg BID until her appt

## 2016-10-24 ENCOUNTER — Other Ambulatory Visit: Payer: Self-pay | Admitting: Allergy and Immunology

## 2016-10-24 DIAGNOSIS — J4520 Mild intermittent asthma, uncomplicated: Secondary | ICD-10-CM

## 2016-10-24 DIAGNOSIS — T50905D Adverse effect of unspecified drugs, medicaments and biological substances, subsequent encounter: Secondary | ICD-10-CM

## 2016-10-24 MED FILL — DIVALPROEX SOD ER 250 MG TA: 250 | 90 days supply | Qty: 360 | Fill #1

## 2016-10-24 MED FILL — GABAPENTIN 300 MG CAPSULE: 300 | 90 days supply | Qty: 540 | Fill #2

## 2016-10-25 ENCOUNTER — Other Ambulatory Visit: Payer: Self-pay | Admitting: Hematology and Oncology

## 2016-10-25 ENCOUNTER — Telehealth: Payer: Self-pay

## 2016-10-25 DIAGNOSIS — R112 Nausea with vomiting, unspecified: Secondary | ICD-10-CM

## 2016-10-25 DIAGNOSIS — K769 Liver disease, unspecified: Secondary | ICD-10-CM

## 2016-10-25 NOTE — Telephone Encounter (Signed)
Patient called wanting a GI referral, she said her PCP was supposed to be making a referral but has not. She was asking if Dr. Alvy Bimler would make a GI referral. She has seen Dr. Paulita Fujita at [facility] [phone] in the past, but has no preference.  She states that her Mom's friend was diagnosed with Enteric Neuropathy or the other name is Intestinal pseudo obstruction recently and she thinks that may be her problem.

## 2016-10-25 NOTE — Telephone Encounter (Signed)
Called patient with below message. 

## 2016-10-25 NOTE — Telephone Encounter (Signed)
I sent a referral to Dr. Paulita Fujita

## 2016-10-27 ENCOUNTER — Other Ambulatory Visit: Payer: Self-pay

## 2016-10-27 ENCOUNTER — Other Ambulatory Visit (HOSPITAL_COMMUNITY): Payer: Self-pay | Admitting: Gastroenterology

## 2016-10-27 DIAGNOSIS — K769 Liver disease, unspecified: Secondary | ICD-10-CM | POA: Diagnosis not present

## 2016-10-27 DIAGNOSIS — R110 Nausea: Secondary | ICD-10-CM

## 2016-10-27 DIAGNOSIS — R109 Unspecified abdominal pain: Secondary | ICD-10-CM | POA: Diagnosis not present

## 2016-10-27 DIAGNOSIS — R51 Headache: Secondary | ICD-10-CM

## 2016-10-27 DIAGNOSIS — R202 Paresthesia of skin: Secondary | ICD-10-CM

## 2016-10-27 DIAGNOSIS — C711 Malignant neoplasm of frontal lobe: Secondary | ICD-10-CM

## 2016-10-27 DIAGNOSIS — R112 Nausea with vomiting, unspecified: Secondary | ICD-10-CM

## 2016-10-27 DIAGNOSIS — R519 Headache, unspecified: Secondary | ICD-10-CM

## 2016-10-27 DIAGNOSIS — R6881 Early satiety: Secondary | ICD-10-CM | POA: Diagnosis not present

## 2016-10-27 DIAGNOSIS — R0789 Other chest pain: Secondary | ICD-10-CM

## 2016-10-27 DIAGNOSIS — R4702 Dysphasia: Secondary | ICD-10-CM

## 2016-10-27 MED ORDER — CARISOPRODOL 350 MG PO TABS: ORAL_TABLET | ORAL | 3 refills | Status: DC

## 2016-10-28 ENCOUNTER — Other Ambulatory Visit: Payer: Self-pay | Admitting: Hematology and Oncology

## 2016-10-28 ENCOUNTER — Encounter: Payer: Self-pay | Admitting: Hematology and Oncology

## 2016-10-28 DIAGNOSIS — R110 Nausea: Secondary | ICD-10-CM

## 2016-10-28 DIAGNOSIS — C711 Malignant neoplasm of frontal lobe: Secondary | ICD-10-CM

## 2016-10-28 DIAGNOSIS — R202 Paresthesia of skin: Secondary | ICD-10-CM

## 2016-10-28 DIAGNOSIS — R51 Headache: Secondary | ICD-10-CM

## 2016-10-28 DIAGNOSIS — R4702 Dysphasia: Secondary | ICD-10-CM

## 2016-10-28 DIAGNOSIS — R519 Headache, unspecified: Secondary | ICD-10-CM

## 2016-10-28 DIAGNOSIS — R0789 Other chest pain: Secondary | ICD-10-CM

## 2016-10-28 MED ORDER — HYDROCODONE-ACETAMINOPHEN 10-325 MG PO TABS: 1.0000 | ORAL_TABLET | Freq: Four times a day (QID) | ORAL | 0 refills | Status: DC | PRN

## 2016-10-28 MED ORDER — CARISOPRODOL 350 MG PO TABS: ORAL_TABLET | ORAL | 3 refills | Status: DC

## 2016-11-02 ENCOUNTER — Other Ambulatory Visit: Payer: Self-pay | Admitting: Hematology and Oncology

## 2016-11-02 MED ORDER — DRONABINOL 5 MG PO CAPS: 5.0000 mg | ORAL_CAPSULE | Freq: Two times a day (BID) | ORAL | 0 refills | Status: DC

## 2016-11-02 NOTE — Telephone Encounter (Signed)
FYI: not sure if she needs refill or not

## 2016-11-03 ENCOUNTER — Other Ambulatory Visit: Payer: Self-pay | Admitting: *Deleted

## 2016-11-03 ENCOUNTER — Ambulatory Visit (HOSPITAL_COMMUNITY)
Admission: RE | Admit: 2016-11-03 | Discharge: 2016-11-03 | Disposition: A | Discharge status: Home / Self Care | Payer: Medicare Other | Source: Ambulatory Visit | Attending: Gastroenterology | Admitting: Gastroenterology

## 2016-11-03 DIAGNOSIS — R109 Unspecified abdominal pain: Secondary | ICD-10-CM | POA: Diagnosis not present

## 2016-11-03 DIAGNOSIS — R112 Nausea with vomiting, unspecified: Secondary | ICD-10-CM | POA: Diagnosis not present

## 2016-11-03 MED ORDER — HYDROCODONE-ACETAMINOPHEN 10-325 MG PO TABS: 1.0000 | ORAL_TABLET | Freq: Four times a day (QID) | ORAL | 0 refills | Status: DC | PRN

## 2016-11-03 MED ORDER — TECHNETIUM TC 99M SULFUR COLLOID
2.0000 | Freq: Once | INTRAVENOUS | Status: AC | PRN
  Administered 2016-11-03: 2 via ORAL

## 2016-11-03 MED FILL — DRONABINOL 5 MG CAPSULE: 5 | 30 days supply | Qty: 60 | Fill #0

## 2016-11-03 MED FILL — BUTALB-ACETAMIN-CAFF 50-325: 50-325-40 | 22 days supply | Qty: 90 | Fill #0

## 2016-11-04 MED FILL — HYDROCODON-APAP 10-325: 10-325 | 22 days supply | Qty: 90 | Fill #0

## 2016-11-07 ENCOUNTER — Telehealth: Payer: Self-pay

## 2016-11-07 MED FILL — LEVOCETIRIZINE 5 MG TABLET: 5 | 30 days supply | Qty: 30 | Fill #2

## 2016-11-07 NOTE — Telephone Encounter (Signed)
Patient called and stated she has appointment with Dr. Alvy Bimler for 4-30 and 11-18-16. She wants to know if she should keep both of these appointments.

## 2016-11-07 NOTE — Telephone Encounter (Signed)
Pls call her back and let her decide which one she wants to keep and cancel the other one

## 2016-11-07 NOTE — Telephone Encounter (Signed)
Patient given below message. States that she will decide this Friday on which appt she will cancel, and call us. Unless we need that time.  She wants to ask Dr. Alvy Bimler if she should go back on antibiotics like she did in February when she was taking 2 different antibiotics and started the Marinol at the same time. She is thinking maybe the antibiotics did help and it was not the Marinol that made her feel better. She states that she is having a hard time eating with being sick with nausea and diarrhea, etc. Then she wondered at the end of the conversation if she should call her GI doctor.

## 2016-11-08 MED FILL — CARISOPRODOL 350 MG TABLET: 350 | 23 days supply | Qty: 90 | Fill #0

## 2016-11-08 NOTE — Telephone Encounter (Signed)
Called and left below message. 

## 2016-11-08 NOTE — Telephone Encounter (Signed)
No antibiotics, in the absence of infection Please encourage her to call Gi doctor regarding her symptoms

## 2016-11-11 ENCOUNTER — Telehealth: Payer: Self-pay

## 2016-11-11 NOTE — Telephone Encounter (Signed)
-----   Message from Loletta Parish sent at 11/11/2016  2:58 PM EDT ----- Regarding: RE: Auth. for MRI Referral has been authorized.  Thanks ----- Message ----- From: Flo Shanks, RN Sent: 11/11/2016   2:42 PM To: Loletta Parish Subject: Auth. for MRI                                  Do we have the authorization for MRI? It is supposed to be next week. Thanks!

## 2016-11-11 NOTE — Telephone Encounter (Signed)
Called with below message. They are to call patient and schedule.

## 2016-11-11 NOTE — Telephone Encounter (Signed)
Patient called and canceled appt for 4-30. She asked about MRI for next week, sent message to Elmyra Ricks to see if we had authorization.

## 2016-11-14 ENCOUNTER — Ambulatory Visit: Payer: Medicare Other | Admitting: Hematology and Oncology

## 2016-11-16 ENCOUNTER — Other Ambulatory Visit: Payer: Medicaid Other

## 2016-11-16 ENCOUNTER — Ambulatory Visit (HOSPITAL_COMMUNITY): Admission: RE | Admit: 2016-11-16 | Payer: Medicare Other | Source: Ambulatory Visit

## 2016-11-17 ENCOUNTER — Other Ambulatory Visit (HOSPITAL_COMMUNITY): Payer: Self-pay | Admitting: Gastroenterology

## 2016-11-17 ENCOUNTER — Other Ambulatory Visit (HOSPITAL_BASED_OUTPATIENT_CLINIC_OR_DEPARTMENT_OTHER): Payer: Medicare Other

## 2016-11-17 ENCOUNTER — Ambulatory Visit: Payer: Medicare Other

## 2016-11-17 ENCOUNTER — Ambulatory Visit (HOSPITAL_COMMUNITY)
Admission: RE | Admit: 2016-11-17 | Discharge: 2016-11-17 | Disposition: A | Discharge status: Home / Self Care | Payer: Medicare Other | Source: Ambulatory Visit | Attending: Hematology and Oncology | Admitting: Hematology and Oncology

## 2016-11-17 ENCOUNTER — Ambulatory Visit (HOSPITAL_COMMUNITY): Admission: RE | Admit: 2016-11-17 | Payer: Medicare Other | Source: Ambulatory Visit

## 2016-11-17 ENCOUNTER — Other Ambulatory Visit: Payer: Medicare Other

## 2016-11-17 VITALS — BP 138/84 | HR 84 | Resp 18

## 2016-11-17 DIAGNOSIS — K769 Liver disease, unspecified: Secondary | ICD-10-CM

## 2016-11-17 DIAGNOSIS — C711 Malignant neoplasm of frontal lobe: Secondary | ICD-10-CM

## 2016-11-17 DIAGNOSIS — D61818 Other pancytopenia: Secondary | ICD-10-CM | POA: Diagnosis not present

## 2016-11-17 DIAGNOSIS — D539 Nutritional anemia, unspecified: Secondary | ICD-10-CM | POA: Diagnosis not present

## 2016-11-17 DIAGNOSIS — R109 Unspecified abdominal pain: Secondary | ICD-10-CM

## 2016-11-17 DIAGNOSIS — C719 Malignant neoplasm of brain, unspecified: Secondary | ICD-10-CM | POA: Diagnosis not present

## 2016-11-17 DIAGNOSIS — R112 Nausea with vomiting, unspecified: Secondary | ICD-10-CM

## 2016-11-17 LAB — CBC WITH DIFFERENTIAL/PLATELET
BASO%: 0.7 % (ref 0.0–2.0)
Basophils Absolute: 0 10*3/uL (ref 0.0–0.1)
EOS%: 1.3 % (ref 0.0–7.0)
Eosinophils Absolute: 0 10*3/uL (ref 0.0–0.5)
HCT: 33.2 % — ABNORMAL LOW (ref 34.8–46.6)
HGB: 11.4 g/dL — ABNORMAL LOW (ref 11.6–15.9)
LYMPH%: 28.2 % (ref 14.0–49.7)
MCH: 33.1 pg (ref 25.1–34.0)
MCHC: 34.3 g/dL (ref 31.5–36.0)
MCV: 96.5 fL (ref 79.5–101.0)
MONO#: 0.3 10*3/uL (ref 0.1–0.9)
MONO%: 8.5 % (ref 0.0–14.0)
NEUT#: 1.9 10*3/uL (ref 1.5–6.5)
NEUT%: 61.3 % (ref 38.4–76.8)
Platelets: 158 10*3/uL (ref 145–400)
RBC: 3.44 10*6/uL — ABNORMAL LOW (ref 3.70–5.45)
RDW: 11.9 % (ref 11.2–14.5)
WBC: 3.1 10*3/uL — ABNORMAL LOW (ref 3.9–10.3)
lymph#: 0.9 10*3/uL (ref 0.9–3.3)

## 2016-11-17 LAB — COMPREHENSIVE METABOLIC PANEL
ALT: 10 U/L (ref 0–55)
AST: 11 U/L (ref 5–34)
Albumin: 3.8 g/dL (ref 3.5–5.0)
Alkaline Phosphatase: 44 U/L (ref 40–150)
Anion Gap: 6 mEq/L (ref 3–11)
BUN: 5.7 mg/dL — ABNORMAL LOW (ref 7.0–26.0)
CO2: 27 mEq/L (ref 22–29)
Calcium: 8.6 mg/dL (ref 8.4–10.4)
Chloride: 107 mEq/L (ref 98–109)
Creatinine: 0.7 mg/dL (ref 0.6–1.1)
EGFR: >90 mL/min/{1.73_m2} (ref >90)
Glucose: 91 mg/dl (ref 70–140)
Potassium: 4.3 mEq/L (ref 3.5–5.1)
Sodium: 140 mEq/L (ref 136–145)
Total Bilirubin: 0.26 mg/dL (ref 0.20–1.20)
Total Protein: 6.4 g/dL (ref 6.4–8.3)

## 2016-11-17 MED ORDER — GADOBENATE DIMEGLUMINE 529 MG/ML IV SOLN
15.0000 mL | Freq: Once | INTRAVENOUS | Status: AC | PRN
  Administered 2016-11-17: 14 mL via INTRAVENOUS

## 2016-11-17 MED ORDER — SODIUM CHLORIDE 0.9 % IJ SOLN
10.0000 mL | INTRAMUSCULAR | Status: DC | PRN
  Administered 2016-11-17: 10 mL via INTRAVENOUS
  Filled 2016-11-17: qty 10

## 2016-11-17 NOTE — Patient Instructions (Signed)

## 2016-11-18 ENCOUNTER — Ambulatory Visit (HOSPITAL_BASED_OUTPATIENT_CLINIC_OR_DEPARTMENT_OTHER): Payer: Medicare Other | Admitting: Hematology and Oncology

## 2016-11-18 ENCOUNTER — Telehealth: Payer: Self-pay | Admitting: Hematology and Oncology

## 2016-11-18 VITALS — BP 141/113 | HR 99 | Temp 98.0°F | Resp 18 | Ht 67.0 in | Wt 152.7 lb

## 2016-11-18 DIAGNOSIS — C711 Malignant neoplasm of frontal lobe: Secondary | ICD-10-CM

## 2016-11-18 DIAGNOSIS — M792 Neuralgia and neuritis, unspecified: Secondary | ICD-10-CM

## 2016-11-18 DIAGNOSIS — D61810 Antineoplastic chemotherapy induced pancytopenia: Secondary | ICD-10-CM | POA: Diagnosis not present

## 2016-11-18 DIAGNOSIS — Z7189 Other specified counseling: Secondary | ICD-10-CM

## 2016-11-18 DIAGNOSIS — R4189 Other symptoms and signs involving cognitive functions and awareness: Secondary | ICD-10-CM | POA: Diagnosis not present

## 2016-11-18 DIAGNOSIS — R110 Nausea: Secondary | ICD-10-CM | POA: Diagnosis not present

## 2016-11-18 DIAGNOSIS — G40109 Localization-related (focal) (partial) symptomatic epilepsy and epileptic syndromes with simple partial seizures, not intractable, without status epilepticus: Secondary | ICD-10-CM | POA: Diagnosis not present

## 2016-11-18 DIAGNOSIS — T451X5A Adverse effect of antineoplastic and immunosuppressive drugs, initial encounter: Secondary | ICD-10-CM

## 2016-11-18 LAB — VITAMIN B12: Vitamin B12: 1925 pg/mL — ABNORMAL HIGH (ref 232–1245)

## 2016-11-18 MED ORDER — LORAZEPAM 1 MG PO TABS: 1.0000 mg | ORAL_TABLET | Freq: Three times a day (TID) | ORAL | 0 refills | Status: DC | PRN

## 2016-11-18 MED ORDER — DRONABINOL 10 MG PO CAPS: 10.0000 mg | ORAL_CAPSULE | Freq: Two times a day (BID) | ORAL | 3 refills | Status: DC

## 2016-11-18 MED ORDER — HYDROCODONE-ACETAMINOPHEN 10-325 MG PO TABS: 1.0000 | ORAL_TABLET | ORAL | 0 refills | Status: DC | PRN

## 2016-11-18 MED FILL — LORazepam 1 MG TABS: 1 | 30 days supply | Qty: 90 | Fill #0

## 2016-11-18 NOTE — Telephone Encounter (Signed)
Gave patient AVS and calender per 5/4 los.  

## 2016-11-20 DIAGNOSIS — Z7189 Other specified counseling: Secondary | ICD-10-CM | POA: Insufficient documentation

## 2016-11-20 NOTE — Assessment & Plan Note (Signed)
I reviewed the report from neuropsychiatric assessments. 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

## 2016-11-20 NOTE — Assessment & Plan Note (Signed)
The MRIs show stable disease with no changes. I recommend observation with surveillance imaging every 3 months. I will bring her in every 6 weeks for port flushes and blood draw

## 2016-11-20 NOTE — Assessment & Plan Note (Signed)
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 I spent a lot of time discussing about the risk and benefit of using morphine sulfate versus hydrocodone I do not recommend she stays on both The patient stated that she uses morphine sulfate if she have severe intractable pain With scheduled lorazepam as above, I suspect it may help with her headache and severe neuropathic pain as well Ultimately, what in agreement to just refill hydrocodone and not morphine sulfate. She is also using Fioricet for occasional headache

## 2016-11-20 NOTE — Assessment & Plan Note (Signed)
The patient has recurrent seizures. She will continue anti-seizure medications as prescribed The patient also have chronic nausea, anxiety and depression. Previously, while she was on lorazepam during chemotherapy, the frequency of seizures are less I recommend she takes lorazepam in the morning and afternoon daily for other reasons and hopefully this would give her better control of seizures as well

## 2016-11-20 NOTE — Assessment & Plan Note (Signed)
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  Continue close surveillance/observation

## 2016-11-20 NOTE — Assessment & Plan Note (Signed)
She has extended chemotherapy induced nausea, vomiting with poor appetite. We discussed a trial of Marinol and she agreed to proceed She felt that Marinol is helping her at least at the beginning but less so as time goes on She is wondering whether she is developing tolerance We spent a lot of time discussing about dosing on Marinol Ultimately, after extensive discussion, we are in in agreement to try Marinol at increased dose 10 mg twice a day along with scheduled lorazepam as above for chronic nausea control

## 2016-11-20 NOTE — Assessment & Plan Note (Signed)
I spent a lot of time counseling the patient. She is very emotional when it comes to coping with chronic illness She wants to be strong for her children She has some disagreement with her husband, [name] related to her medications and others I recommend seeing her again next month for further review of all these medical changes She appreciate the emotional support

## 2016-11-20 NOTE — Progress Notes (Signed)
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      09/27/2013 Pathology Results    Accession: [id number] pathology showed grade 2 oligodendroglioma with mutant IDH1 protein, ATRX, OLIG 2 expression. p53 is negative, low Ki-67 labeling index. 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      11/06/2013 Imaging    Repeat CT scan of the head showed resolution of abscess.      01/23/2014 Surgery    Dr. Christella Noa perform a subtotal gross resection of the tumor.      01/24/2014 Imaging    Repeat imaging studies showed persistent and residual disease.      03/03/2014 - 09/01/2014 Chemotherapy    She was started on Temodar, 5 days every 28 days      03/13/2014 Imaging    Repeat MRI showed no evidence of progression of disease.      07/14/2014 Imaging    Repeat MRI showed no evidence of disease progression.      09/12/2014 Imaging    MRI of the liver show mild regression of the size of lesions, presumed to be benign      09/15/2014 Imaging    Repeat MRI of the brain show persistent abnormalities, suspicious for residual disease      03/02/2015 Imaging     MRI brain at [facility] show persistent abnormalities      08/10/2015 Imaging    Repeat MRI [facility] showed stable disease      08/24/2015 - 02/08/2016 Chemotherapy     She was started on Lomustine, Procarbazine and Vincristine chemotherapy      08/30/2015 Imaging    CT chest showed no evidence of aspiration pneumonia      11/18/2015 Imaging    MRI brain showed status post resection of right frontal lobe tumor without residual or recurrent tumor. 2. Otherwise negative MRI of the brain.       03/02/2016 Imaging    Stable MRI post right frontal lobe tumor resection. Negative for recurrent tumor      06/01/2016 Imaging    Stable MRI post right frontal lobe tumor resection. No recurrent tumor identified      08/19/2016 Imaging    Unchanged examination without tumor recurrence.      11/17/2016 Imaging    Stable right frontal resection cavity. Stable focal mass effect within the right middle frontal gyrus at the superior margin of the cavity which may represent nonenhancing neoplasm. No evidence for tumor progression. Otherwise stable MRI of the brain       INTERVAL HISTORY: Please see below for problem oriented charting. She returns for further follow-up We have extensive discussion today related to her chronic headache, chronic neuropathic pain, recurrent seizures, her emotional state due to her chronic illness and others She continues to have frequent seizures.  She starts stuttering when she is emotional, evident today She felt that  the Marinol has helped with her nausea, at least initially but not much lately She does not eat much because of the nausea She denies vomiting or constipation She denies new neurological deficits. She continues to have significant memory impairment on the regular basis. She has abdominal discomfort and is undergoing further evaluation from the GI department.  REVIEW OF SYSTEMS:   Constitutional: Denies fevers, chills or abnormal weight loss Eyes: Denies blurriness of vision Ears, nose, mouth, throat, and face: Denies mucositis or sore throat Respiratory: Denies cough, dyspnea or wheezes Cardiovascular: Denies  palpitation, chest discomfort or lower extremity swelling Skin: Denies abnormal skin rashes Lymphatics: Denies new lymphadenopathy or easy bruising All other systems were reviewed with the patient and are negative.  I have reviewed the past medical history, past surgical history, social history and family history with the patient and they are unchanged from previous note.  ALLERGIES:  is allergic to adhesive [tape]; latex; percocet [oxycodone-acetaminophen]; shellfish allergy; skin adhesives; soma [carisoprodol]; tramadol hcl; and vancomycin.  MEDICATIONS:  Current Outpatient Prescriptions  Medication Sig Dispense Refill  . butalbital-acetaminophen-caffeine (FIORICET, ESGIC) 50-325-40 MG tablet TAKE 1 TABLET BY MOUTH EVERY 6 HOURS AS NEEDED FOR HEADACHE 90 tablet 0  . carisoprodol (SOMA) 350 MG tablet TAKE 1 TABLET BY MOUTH FOUR TIMES DAILY FOR SEIZURE PREVENTION 90 tablet 3  . divalproex (DEPAKOTE ER) 250 MG 24 hr tablet Take 2 tablets twice a day 360 tablet 3  . dronabinol (MARINOL) 10 MG capsule Take 1 capsule (10 mg total) by mouth 2 (two) times daily. 60 capsule 3  . gabapentin (NEURONTIN) 300 MG capsule TAKE 3 CAPSULES BY MOUTH IN THE MORNING AND 3 CAPSULES IN THE EVENING 540 capsule 3  . HYDROcodone-acetaminophen (NORCO) 10-325 MG tablet Take 1 tablet by mouth every 4 (four) hours as needed. 90 tablet 0  . levocetirizine (XYZAL) 5 MG tablet Take 1 tablet (5 mg total) by mouth every evening. 60 tablet 5  . lidocaine-prilocaine (EMLA) cream APPLY TO AFFECTED AREA(S) AS NEEDED 30 g 6  . LORazepam (ATIVAN) 1 MG tablet Take 1 tablet (1 mg total) by mouth every 8 (eight) hours as needed for anxiety or seizure. 90 tablet 0  . montelukast (SINGULAIR) 10 MG tablet TAKE 1 TABLET BY MOUTH AT BEDTIME 30 tablet 0  . morphine (MSIR) 30 MG tablet Take 1 tablet (30 mg total) by mouth every 4 (four) hours as needed for severe pain. 90 tablet 0  . ondansetron (ZOFRAN ODT) 8 MG disintegrating tablet Take  1 tablet (8 mg total) by mouth every 8 (eight) hours as needed for nausea or vomiting. (Patient not taking: Reported on 11/18/2016) 90 tablet 3  . promethazine (PHENERGAN) 25 MG tablet Take 1 tablet (25 mg total) by mouth every 6 (six) hours as needed for nausea or vomiting. (Patient not taking: Reported on 11/18/2016) 90 tablet 9   No current facility-administered medications for this visit.     PHYSICAL EXAMINATION: ECOG PERFORMANCE STATUS: 1 - Symptomatic but completely ambulatory  Vitals:   11/18/16 1313  BP: (!) 141/113  Pulse: 99  Resp: 18  Temp: 98 F (36.7 C)   Filed Weights   11/18/16 1313  Weight: 152 lb 11.2 oz (69.3 kg)    GENERAL:alert, no distress and comfortable SKIN: skin color, texture, turgor are normal, no rashes or significant lesions EYES: normal, Conjunctiva are pink and non-injected, sclera clear Musculoskeletal:no cyanosis of digits and no clubbing  NEURO: alert & oriented x 3 with fluent speech, occasional  stuttering is noted along with mild muscle twitches on her face,  no permanent focal motor/sensory deficits  LABORATORY DATA:  I have reviewed the data as listed    Component Value Date/Time   NA 140 11/17/2016 1543   K 4.3 11/17/2016 1543   CL 108 02/17/2016 0357   CO2 27 11/17/2016 1543   GLUCOSE 91 11/17/2016 1543   BUN 5.7 (L) 11/17/2016 1543   CREATININE 0.7 11/17/2016 1543   CALCIUM 8.6 11/17/2016 1543   PROT 6.4 11/17/2016 1543   ALBUMIN 3.8 11/17/2016 1543   AST 11 11/17/2016 1543   ALT 10 11/17/2016 1543   ALKPHOS 44 11/17/2016 1543   BILITOT 0.26 11/17/2016 1543   GFRNONAA >60 02/17/2016 0357   GFRAA >60 02/17/2016 0357    No results found for: SPEP, UPEP  Lab Results  Component Value Date   WBC 3.1 (L) 11/17/2016   NEUTROABS 1.9 11/17/2016   HGB 11.4 (L) 11/17/2016   HCT 33.2 (L) 11/17/2016   MCV 96.5 11/17/2016   PLT 158 11/17/2016      Chemistry      Component Value Date/Time   NA 140 11/17/2016 1543   K 4.3  11/17/2016 1543   CL 108 02/17/2016 0357   CO2 27 11/17/2016 1543   BUN 5.7 (L) 11/17/2016 1543   CREATININE 0.7 11/17/2016 1543      Component Value Date/Time   CALCIUM 8.6 11/17/2016 1543   ALKPHOS 44 11/17/2016 1543   AST 11 11/17/2016 1543   ALT 10 11/17/2016 1543   BILITOT 0.26 11/17/2016 1543       RADIOGRAPHIC STUDIES: I have reviewed the imaging study with the patient I have personally reviewed the radiological images as listed and agreed with the findings in the report. Mr Jeri Cos Wo Contrast  Result Date: 11/17/2016 CLINICAL DATA:  36 y/o F; frontal lobe oligodendroglioma, subsequent encounter. Recent increase in shakiness. EXAM: MRI HEAD WITHOUT AND WITH CONTRAST TECHNIQUE: Multiplanar, multiecho pulse sequences of the brain and surrounding structures were obtained without and with intravenous contrast. CONTRAST:  74m MULTIHANCE GADOBENATE DIMEGLUMINE 529 MG/ML IV SOLN COMPARISON:  08/19/2016, 11/18/2015, 09/15/2014 MRI of the head. FINDINGS: Brain: Right lateral frontal hemosiderin line resection cavity is stable. Mild focal mass effect with increased T2 signal within the middle frontal gyrus at the superior margin of the resection cavity (Series 6, image 13) is stable in comparison with prior MRIs of the brain. T2 FLAIR hyperintense signal abnormality at the margins of the cavity is also stable. No abnormal diffusion to suggest acute or early subacute infarction. No evidence for new intracranial hemorrhage, focal mass effect, or abnormal enhancement. No hydrocephalus, extra-axial collection, or effacement of basilar cisterns. Vascular: Normal flow voids. Skull and upper cervical spine: Stable postsurgical changes related to right frontal craniotomy. Sinuses/Orbits: Negative. Other: None. IMPRESSION: Stable right frontal resection cavity. Stable focal mass effect within the right middle frontal gyrus at the superior margin of the cavity which may represent nonenhancing neoplasm. No  evidence for tumor progression. Otherwise stable MRI of the brain. Electronically Signed   By: LKristine GarbeM.D.   On: 11/17/2016 18:18   Nm Gastric Emptying  Result Date: 11/03/2016 CLINICAL DATA:  Chronic abdominal pain with nausea and vomiting for several years EXAM: NUCLEAR MEDICINE GASTRIC EMPTYING SCAN TECHNIQUE: After oral ingestion of radiolabeled meal, sequential abdominal images were obtained for 4 hours. Percentage of activity emptying the stomach was calculated at 1 hour, 2 hour, 3 hour, and 4 hours. RADIOPHARMACEUTICALS:  2.1 mCi Tc-66msulfur colloid in standardized meal COMPARISON:  None. FINDINGS: Expected location of the stomach in the left upper quadrant. Ingested meal empties the stomach gradually over the course of the study. 60% emptied at 1 hr ( normal >= 10%) 48% emptied at 2 hr ( normal >= 40%) 67% emptied at 3 hr ( normal >= 70%) 94% emptied at 4 hr ( normal >= 90%) IMPRESSION: Normal gastric emptying study. Electronically Signed   By: JEarle GellM.D.   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 I recommend observation with surveillance imaging every 3 months. I will bring her in every 6 weeks for port flushes and blood draw  Pancytopenia due to antineoplastic chemotherapy ([facility] She has prolonged pancytopenia related to residual side effects from chemotherapy Overall, the pancytopenia is improving.  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 She will continue anti-seizure medications as prescribed The patient also have chronic nausea, anxiety and depression. Previously, while she was on lorazepam during chemotherapy, the frequency of seizures are less I recommend she takes lorazepam in the morning and afternoon daily for other reasons and hopefully  this would give her better control of seizures as well  Nausea without vomiting She has extended chemotherapy induced nausea, vomiting with poor appetite. We discussed a trial of Marinol and she agreed to proceed She felt that Marinol is helping her at least at the beginning but less so as time goes on She is wondering whether she is developing tolerance We spent a lot of time discussing about dosing on Marinol Ultimately, after extensive discussion, we are in in agreement to try Marinol at increased dose 10 mg twice a day along with scheduled lorazepam as above for chronic nausea control  Cognitive impairment I reviewed the report from neuropsychiatric assessments. The patient had significant memory impairment as a result of her prior surgery, her disease, her seizure medications and her recurrent seizures She appears to be coping well with writing down information and reminders   Neuropathic pain She has chronic headache which is multifactorial, related to prior resection with neuropathic pain component We will continue conservative management with her current medications. I spent a lot of time discussing about the risk and benefit of using morphine sulfate versus hydrocodone I do not recommend she stays on both The patient stated that she uses morphine sulfate if she have severe intractable pain With scheduled lorazepam as above, I suspect it may help with her headache and severe neuropathic pain as well Ultimately, what in agreement to just refill hydrocodone and not morphine sulfate. She is also using Fioricet for occasional headache  Goals of care, counseling/discussion I spent a lot of time counseling the patient. She is very emotional when it comes to coping with chronic illness She wants to be strong for her children She has some disagreement with her husband, DMarlou Sarelated to her medications and others I recommend seeing her again next month for further review of all these  medical changes She appreciate the emotional support   No orders of the defined types were placed in this encounter.  All questions were answered. The patient knows to call the clinic with any problems, questions or concerns. No barriers to learning was detected. I spent 40 minutes counseling the patient face to face. The total time spent  in the appointment was 55 minutes and more than 50% was on counseling and review of test results     Heath Lark, MD 11/20/2016 7:05 AM

## 2016-11-21 ENCOUNTER — Encounter: Payer: Self-pay | Admitting: Hematology and Oncology

## 2016-11-21 MED FILL — DRONABINOL 10 MG CAP: 10 | 30 days supply | Qty: 60 | Fill #0

## 2016-11-21 NOTE — Progress Notes (Signed)
Received PA request for Dronabinol.  Called [facility]) to initiate PA. PA was approved. Called [facility] Roys who confirm it went through.

## 2016-11-22 MED FILL — HYDROCODON-APAP 10-325: 10-325 | 15 days supply | Qty: 90 | Fill #0

## 2016-11-23 ENCOUNTER — Ambulatory Visit (HOSPITAL_COMMUNITY): Payer: Medicare Other

## 2016-11-23 ENCOUNTER — Ambulatory Visit (HOSPITAL_COMMUNITY): Admission: RE | Admit: 2016-11-23 | Payer: Medicare Other | Source: Ambulatory Visit

## 2016-11-28 ENCOUNTER — Other Ambulatory Visit: Payer: Self-pay | Admitting: Hematology and Oncology

## 2016-11-28 ENCOUNTER — Telehealth: Payer: Self-pay

## 2016-11-28 MED ORDER — BUTALBITAL-APAP-CAFFEINE 50-325-40 MG PO TABS: 1.0000 | ORAL_TABLET | Freq: Four times a day (QID) | ORAL | 0 refills | Status: DC | PRN

## 2016-11-28 MED FILL — BUTALB-ACETAMIN-CAFF 50-325: 50-325-40 | 22 days supply | Qty: 90 | Fill #0

## 2016-11-28 MED FILL — CARISOPRODOL 350 MG TABLET: 350 | 23 days supply | Qty: 90 | Fill #1

## 2016-11-28 NOTE — Telephone Encounter (Signed)
Jesseca called and informed Rx sent to [facility] outpatient [facility].

## 2016-11-29 ENCOUNTER — Ambulatory Visit (HOSPITAL_COMMUNITY): Admission: RE | Admit: 2016-11-29 | Payer: Medicare Other | Source: Ambulatory Visit

## 2016-11-29 ENCOUNTER — Ambulatory Visit (HOSPITAL_COMMUNITY)
Admission: RE | Admit: 2016-11-29 | Discharge: 2016-11-29 | Disposition: A | Discharge status: Home / Self Care | Payer: Medicare Other | Source: Ambulatory Visit | Attending: Gastroenterology | Admitting: Gastroenterology

## 2016-11-29 DIAGNOSIS — D3502 Benign neoplasm of left adrenal gland: Secondary | ICD-10-CM | POA: Diagnosis not present

## 2016-11-29 DIAGNOSIS — Z90710 Acquired absence of both cervix and uterus: Secondary | ICD-10-CM | POA: Diagnosis not present

## 2016-11-29 DIAGNOSIS — K769 Liver disease, unspecified: Secondary | ICD-10-CM | POA: Insufficient documentation

## 2016-11-29 DIAGNOSIS — R109 Unspecified abdominal pain: Secondary | ICD-10-CM | POA: Diagnosis not present

## 2016-11-29 DIAGNOSIS — R112 Nausea with vomiting, unspecified: Secondary | ICD-10-CM | POA: Diagnosis not present

## 2016-11-29 DIAGNOSIS — D3500 Benign neoplasm of unspecified adrenal gland: Secondary | ICD-10-CM | POA: Diagnosis not present

## 2016-11-29 DIAGNOSIS — K7689 Other specified diseases of liver: Secondary | ICD-10-CM | POA: Diagnosis not present

## 2016-11-29 MED ORDER — SODIUM CHLORIDE 0.9% FLUSH: 10.0000 mL | Freq: Two times a day (BID) | INTRAVENOUS | Status: DC

## 2016-11-29 MED ORDER — GADOBENATE DIMEGLUMINE 529 MG/ML IV SOLN
15.0000 mL | Freq: Once | INTRAVENOUS | Status: AC
  Administered 2016-11-29: 15 mL via INTRAVENOUS

## 2016-11-29 MED ORDER — SODIUM CHLORIDE 0.9% FLUSH
10.0000 mL | INTRAVENOUS | Status: DC | PRN
  Administered 2016-11-29: 10 mL

## 2016-11-29 MED ORDER — HEPARIN SOD (PORK) LOCK FLUSH 100 UNIT/ML IV SOLN
500.0000 [IU] | INTRAVENOUS | Status: AC | PRN
  Administered 2016-11-29: 500 [IU]

## 2016-12-05 MED FILL — LEVOCETIRIZINE 5 MG TABLET: 5 | 30 days supply | Qty: 30 | Fill #3

## 2016-12-06 ENCOUNTER — Other Ambulatory Visit: Payer: Self-pay | Admitting: Gastroenterology

## 2016-12-06 DIAGNOSIS — R112 Nausea with vomiting, unspecified: Secondary | ICD-10-CM

## 2016-12-07 ENCOUNTER — Telehealth: Payer: Self-pay | Admitting: *Deleted

## 2016-12-07 ENCOUNTER — Other Ambulatory Visit: Payer: Self-pay | Admitting: Hematology and Oncology

## 2016-12-07 ENCOUNTER — Other Ambulatory Visit: Payer: Self-pay | Admitting: *Deleted

## 2016-12-07 DIAGNOSIS — R7989 Other specified abnormal findings of blood chemistry: Secondary | ICD-10-CM

## 2016-12-07 DIAGNOSIS — E2740 Unspecified adrenocortical insufficiency: Secondary | ICD-10-CM

## 2016-12-07 MED ORDER — HYDROCODONE-ACETAMINOPHEN 10-325 MG PO TABS: 1.0000 | ORAL_TABLET | ORAL | 0 refills | Status: DC | PRN

## 2016-12-07 MED FILL — HYDROCODON-APAP 10-325: 10-325 | 15 days supply | Qty: 90 | Fill #0

## 2016-12-07 NOTE — Telephone Encounter (Signed)
Referral called to Dr Altheimer's office. They state they do not need a referral and will call patient

## 2016-12-07 NOTE — Telephone Encounter (Signed)
FYI The refill has been picked up by spouse) 1. "I need a Hydrocodone 10-325 refill today.  Call my husband [name] [phone]) who'll be in the area when he gets off work at lunch time today."    2. "I need a new referral to the endocrinologist Dr. Legrand Como Altheimer.  This morning this office called saying last year's referral from [place] has expired.  It will be quicker and best if Dr. Alvy Bimler sends a new request instead of Dr.Outlaw. 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

## 2016-12-07 NOTE — Telephone Encounter (Signed)
Refill is done It is not clear if her insurance would be accepted in the particular practice. I placed the order. Victoria Bates, please call and follow on the endocrinology referral

## 2016-12-15 ENCOUNTER — Encounter (HOSPITAL_COMMUNITY): Payer: Self-pay

## 2016-12-15 ENCOUNTER — Emergency Department (HOSPITAL_COMMUNITY)
Admission: EM | Admit: 2016-12-15 | Discharge: 2016-12-16 | Disposition: A | Discharge status: Home / Self Care | Payer: Medicare Other | Attending: Emergency Medicine | Admitting: Emergency Medicine

## 2016-12-15 ENCOUNTER — Emergency Department (HOSPITAL_COMMUNITY): Payer: Medicare Other

## 2016-12-15 ENCOUNTER — Ambulatory Visit
Admission: RE | Admit: 2016-12-15 | Discharge: 2016-12-15 | Disposition: A | Discharge status: Home / Self Care | Payer: Medicare Other | Source: Ambulatory Visit | Attending: Gastroenterology | Admitting: Gastroenterology

## 2016-12-15 DIAGNOSIS — R531 Weakness: Secondary | ICD-10-CM | POA: Diagnosis not present

## 2016-12-15 DIAGNOSIS — J45909 Unspecified asthma, uncomplicated: Secondary | ICD-10-CM | POA: Insufficient documentation

## 2016-12-15 DIAGNOSIS — Z91040 Latex allergy status: Secondary | ICD-10-CM | POA: Diagnosis not present

## 2016-12-15 DIAGNOSIS — R29818 Other symptoms and signs involving the nervous system: Secondary | ICD-10-CM | POA: Diagnosis not present

## 2016-12-15 DIAGNOSIS — G43809 Other migraine, not intractable, without status migrainosus: Secondary | ICD-10-CM | POA: Diagnosis not present

## 2016-12-15 DIAGNOSIS — R791 Abnormal coagulation profile: Secondary | ICD-10-CM | POA: Insufficient documentation

## 2016-12-15 DIAGNOSIS — Z85828 Personal history of other malignant neoplasm of skin: Secondary | ICD-10-CM | POA: Insufficient documentation

## 2016-12-15 DIAGNOSIS — Z79899 Other long term (current) drug therapy: Secondary | ICD-10-CM | POA: Diagnosis not present

## 2016-12-15 DIAGNOSIS — R109 Unspecified abdominal pain: Secondary | ICD-10-CM | POA: Diagnosis not present

## 2016-12-15 DIAGNOSIS — R112 Nausea with vomiting, unspecified: Secondary | ICD-10-CM

## 2016-12-15 DIAGNOSIS — K769 Liver disease, unspecified: Secondary | ICD-10-CM | POA: Diagnosis not present

## 2016-12-15 DIAGNOSIS — G43109 Migraine with aura, not intractable, without status migrainosus: Secondary | ICD-10-CM

## 2016-12-15 DIAGNOSIS — G43909 Migraine, unspecified, not intractable, without status migrainosus: Secondary | ICD-10-CM | POA: Diagnosis not present

## 2016-12-15 LAB — COMPREHENSIVE METABOLIC PANEL
ALT: 12 U/L — ABNORMAL LOW (ref 14–54)
AST: 17 U/L (ref 15–41)
Albumin: 4.1 g/dL (ref 3.5–5.0)
Alkaline Phosphatase: 38 U/L (ref 38–126)
Anion gap: 8 (ref 5–15)
BUN: 5 mg/dL — ABNORMAL LOW (ref 6–20)
CO2: 24 mmol/L (ref 22–32)
Calcium: 9.2 mg/dL (ref 8.9–10.3)
Chloride: 106 mmol/L (ref 101–111)
Creatinine, Ser: 0.65 mg/dL (ref 0.44–1.00)
GFR calc Af Amer: >60 mL/min (ref >60)
GFR calc non Af Amer: >60 mL/min (ref >60)
Glucose, Bld: 93 mg/dL (ref 65–99)
Potassium: 3.7 mmol/L (ref 3.5–5.1)
Sodium: 138 mmol/L (ref 135–145)
Total Bilirubin: 0.6 mg/dL (ref 0.3–1.2)
Total Protein: 6.8 g/dL (ref 6.5–8.1)

## 2016-12-15 LAB — DIFFERENTIAL
Basophils Absolute: 0 10*3/uL (ref 0.0–0.1)
Basophils Relative: 1 %
Eosinophils Absolute: 0 10*3/uL (ref 0.0–0.7)
Eosinophils Relative: 1 %
Lymphocytes Relative: 35 %
Lymphs Abs: 1.3 10*3/uL (ref 0.7–4.0)
Monocytes Absolute: 0.4 10*3/uL (ref 0.1–1.0)
Monocytes Relative: 11 %
Neutro Abs: 1.9 10*3/uL (ref 1.7–7.7)
Neutrophils Relative %: 52 %

## 2016-12-15 LAB — I-STAT TROPONIN, ED: Troponin i, poc: 0 ng/mL (ref 0.00–0.08)

## 2016-12-15 LAB — ETHANOL: Alcohol, Ethyl (B): <5 mg/dL (ref <5)

## 2016-12-15 LAB — I-STAT CHEM 8, ED
BUN: 6 mg/dL (ref 6–20)
Calcium, Ion: 1.08 mmol/L — ABNORMAL LOW (ref 1.15–1.40)
Chloride: 102 mmol/L (ref 101–111)
Creatinine, Ser: 0.7 mg/dL (ref 0.44–1.00)
Glucose, Bld: 88 mg/dL (ref 65–99)
HCT: 32 % — ABNORMAL LOW (ref 36.0–46.0)
Hemoglobin: 10.9 g/dL — ABNORMAL LOW (ref 12.0–15.0)
Potassium: 3.7 mmol/L (ref 3.5–5.1)
Sodium: 140 mmol/L (ref 135–145)
TCO2: 29 mmol/L (ref 0–100)

## 2016-12-15 LAB — CBC
HCT: 33.5 % — ABNORMAL LOW (ref 36.0–46.0)
Hemoglobin: 11.4 g/dL — ABNORMAL LOW (ref 12.0–15.0)
MCH: 32.7 pg (ref 26.0–34.0)
MCHC: 34 g/dL (ref 30.0–36.0)
MCV: 96 fL (ref 78.0–100.0)
Platelets: 179 10*3/uL (ref 150–400)
RBC: 3.49 MIL/uL — ABNORMAL LOW (ref 3.87–5.11)
RDW: 11.9 % (ref 11.5–15.5)
WBC: 3.7 10*3/uL — ABNORMAL LOW (ref 4.0–10.5)

## 2016-12-15 LAB — APTT: aPTT: 25 seconds (ref 24–36)

## 2016-12-15 LAB — PROTIME-INR
INR: 0.98
Prothrombin Time: 13 seconds (ref 11.4–15.2)

## 2016-12-15 MED ORDER — DIPHENHYDRAMINE HCL 50 MG/ML IJ SOLN
25.0000 mg | Freq: Once | INTRAMUSCULAR | Status: AC
  Administered 2016-12-16: 25 mg via INTRAVENOUS
  Filled 2016-12-15: qty 1

## 2016-12-15 MED ORDER — SODIUM CHLORIDE 0.9 % IV BOLUS (SEPSIS)
1000.0000 mL | Freq: Once | INTRAVENOUS | Status: AC
  Administered 2016-12-16: 1000 mL via INTRAVENOUS

## 2016-12-15 MED ORDER — PROCHLORPERAZINE EDISYLATE 5 MG/ML IJ SOLN
10.0000 mg | Freq: Once | INTRAMUSCULAR | Status: AC
  Administered 2016-12-16: 10 mg via INTRAVENOUS
  Filled 2016-12-15: qty 2

## 2016-12-15 MED FILL — SUCRALFATE 1 GM TABLET: 1 | 30 days supply | Qty: 90 | Fill #0

## 2016-12-15 NOTE — ED Triage Notes (Addendum)
Pt from home, LSN, 2100 when pt began having left arm and leg weakness/numbness with left sided facial droop. Pt has glioma that she is being treated for. Pt also states that "I ran into a pole at my son's school today around 1430 and hit my head"

## 2016-12-15 NOTE — Consult Note (Signed)
Neurology Consultation Reason for Consult: Code stroke Referring Physician: Oleta Mouse, D  CC: Left-sided weakness  History is obtained from: Patient  HPI: Victoria Bates is a 36 y.o. female with a history of oligodendroglioma resected in 2015 who presents with left-sided weakness that started earlier today. The onset is insidious, so she is not certain exactly when it started, however she states that it got bad around that morning. She ran into a pole at her son's school and thinks that she had visual change at that time already.   Of note, she is been having significant difficulty with eating and diarrhea over the past week. She is actually being evaluated for this by her PCP today and had some blood work drawn. She states that throughout the course of the day she has had a fairly severe unilateral headache which is retro-orbital in location on the left, associated with photophobia.  LKW: Unclear tpa given?: no, unclear time of onset    ROS: A 14 point ROS was performed and is negative except as noted in the HPI.   Past Medical History:  Diagnosis Date  . Anemia   . Anxiety   . Asthma    very mild- no inhaler use since July 2013  . Brain tumor ([place])    Hx: of  . Cancer ([place]) 2003   skin  . Chronic fatigue 10/03/2014  . Cognitive impairment 08/13/2015  . Complication of anesthesia    takes longer for patient to go under anesthesia  . Dizziness   . Dysphasia 02/12/2014  . Glioma, malignant ([place]) dx'd 09/2013  . Headache 02/12/2014  . Headache(784.0)    migraines  . Herniated disc   . Hot flashes 10/03/2014  . Infection    to right head incision  . 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 Liver hemangioma   . Liver lesion 08/22/2014  . Nausea alone 02/12/2014  . Neuropathic pain 04/08/2014  . Petit mal seizure status ([place])   . Seizures ([place])    petit mal serizures most days     Family History  Problem Relation Age of Onset  . Hypertension Mother   . Skin cancer Mother   . Thyroid  cancer Father   . Skin cancer Father   . Breast cancer Maternal Aunt        dx in her 49s  . Prostate cancer Maternal Uncle 63  . Melanoma Maternal Uncle   . Ovarian cancer Maternal Grandmother        dx in her late 32s-40s  . Breast cancer Maternal Aunt        bilateral breast cancer dx in 21s and 37s; possibly BRCA+  . Other Son        multiple CAL spots, bright spots on brain MRI, ? chiari malformation     Social History:  reports that she has never smoked. She has never used smokeless tobacco. She reports that she does not drink alcohol or use drugs.   Exam: Current vital signs: BP (!) 133/91 (BP Location: Right Arm)   Pulse 70   Temp 98.4 F (36.9 C) (Oral)   Resp 16   Ht '5\' 9"'  (1.753 m)   Wt 68.7 kg (151 lb 7 oz)   LMP 09/20/2012   SpO2 99%   BMI 22.36 kg/m  Vital signs in last 24 hours: Temp:  [98.4 F (36.9 C)] 98.4 F (36.9 C) (05/31 2330) Pulse Rate:  [70] 70 (05/31 2330) Resp:  [16] 16 (05/31 2330) BP: (133)/(91) 133/91 (  05/31 2330) SpO2:  [99 %] 99 % (05/31 2330) Weight:  [68.7 kg (151 lb 7 oz)] 68.7 kg (151 lb 7 oz) (05/31 2331)   Physical Exam  Constitutional: Appears well-developed and well-nourished.  Psych: Affect appropriate to situation Eyes: No scleral injection HENT: No OP obstrucion Head: Normocephalic.  Cardiovascular: Normal rate and regular rhythm.  Respiratory: Effort normal and breath sounds normal to anterior ascultation GI: Soft.  No distension. There is no tenderness.  Skin: WDI  Neuro: Mental Status: Patient is awake, alert, oriented to person, place, month, year, and situation. Patient is able to give a clear and coherent history. No signs of aphasia or neglect Cranial Nerves: II: Visual Fields are full. Pupils are equal, round, and reactive to light.   III,IV, VI: EOMI without ptosis or diploplia.  V: Facial sensation is symmetric to temperature VII: Facial movement is decreased on the left VIII: hearing is intact to  voice X: Uvula elevates symmetrically XI: Shoulder shrug is symmetric. XII: tongue is midline without atrophy or fasciculations.  Motor: Tone is normal. Bulk is normal. 5/5 strength was present on the right side, she has 4+/5 strength in left arm and leg without drift Sensory: Sensation is decreased on the left Cerebellar: No clear ataxia  I have reviewed labs in epic and the results pertinent to this consultation are: CMP-unremarkable  I have reviewed the images obtained: CT head-no acute findings  Impression: 36 year old female with mild left-sided weakness and numbness in the setting of previous oligodendroglioma. Possibilities include unmasking of previous deficits due to physiological stressors (migraine and recent GI illness.), Versus progression of her underlying disease. I would favor getting an MRI of her brain with/without contrast. There is significant edema associated with this lesion, then could consider starting on steroids. Also, given that her description of her headache sounds migrainous, I would favor treating as such.  Recommendations: 1) MRI brain with/without contrast 2) migraine cocktail 3) further recommendations pending above imaging.   Roland Rack, MD [facility] [phone]  If 7pm- 7am, please page neurology on call as listed in [place].

## 2016-12-15 NOTE — ED Provider Notes (Signed)
[facility] DEPT Provider Note   CSN: [id number] Arrival date & time: 12/15/16  2306   An emergency department physician performed an initial assessment on this suspected stroke patient at 2310.  History   Chief Complaint Chief Complaint  Patient presents with  . Code Stroke    HPI Victoria Bates is a 36 y.o. female with history of known glioblastoma who presents to the ED as a CODE STROKE. This patient states that since 0930 this morning she has experienced multiple complaints including abnormal balance and L sided weakness. She presented tonight with those complaints. At interview, she is currently reporting weakness and decreased sensation in the L leg. Normal strength and sensation in the L arm. Paresthesias in the bilateral lower extremities which she states is normal for her given a history of neuropathy. She is reporting visual changes which are difficult to characterize. On follow-up interview, she is also reporting several days of nausea, diarrhea, and decreased appetite. No vomiting.   The history is provided by the patient. No language interpreter was used.  Cerebrovascular Accident  This is a new problem. The current episode started 12 to 24 hours ago. Associated symptoms comments: Multiple neurologic complaints per HPI. She has tried nothing for the symptoms.    Past Medical History:  Diagnosis Date  . Anemia   . Anxiety   . Asthma    very mild- no inhaler use since July 2013  . Brain tumor ([place])    Hx: of  . Cancer ([place]) 2003   skin  . Chronic fatigue 10/03/2014  . Cognitive impairment 08/13/2015  . Complication of anesthesia    takes longer for patient to go under anesthesia  . Dizziness   . Dysphasia 02/12/2014  . Glioma, malignant ([place]) dx'd 09/2013  . Headache 02/12/2014  . Headache(784.0)    migraines  . Herniated disc   . Hot flashes 10/03/2014  . Infection    to right head incision  . 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 Liver hemangioma   . Liver lesion  08/22/2014  . Nausea alone 02/12/2014  . Neuropathic pain 04/08/2014  . Petit mal seizure status ([place])   . Seizures ([place])    petit mal serizures most days    Patient Active Problem List   Diagnosis Date Noted  . Goals of care, counseling/discussion 11/20/2016  . Other pancytopenia ([place]) 10/10/2016  . Deficiency anemia 10/10/2016  . Diverticulitis 08/22/2016  . Upper airway cough syndrome 01/28/2016  . Cough variant asthma 01/28/2016  . Dyspnea 01/28/2016  . Drug-induced neutropenia ([place]) 10/12/2015  . 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 Constipation, acute 10/12/2015  . Cognitive impairment 08/13/2015  . Dysphagia, idiopathic 04/30/2015  . Low serum cortisol level ([place]) 10/20/2014  . 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 Chronic fatigue 10/03/2014  . Hot flashes 10/03/2014  . Liver lesion 08/22/2014  . Pancytopenia due to antineoplastic chemotherapy ([place]) 06/23/2014  . Nausea & vomiting 06/23/2014  . Genetic testing 06/13/2014  . Intractable chronic cluster headache 04/28/2014  . Thrombocytopenia ([place]) 04/28/2014  . Neuropathic pain 04/08/2014  . Simple partial seizures ([place]) 03/20/2014  . Sensory seizure ([place]) 03/20/2014  . Nausea without vomiting 02/12/2014  . Dysphasia 02/12/2014  . Headache 02/12/2014  . Seizures ([place]) 02/12/2014  . Financial difficulty 02/12/2014  . CIN I (cervical intraepithelial neoplasia I) 02/12/2014  . Anemia, unspecified 02/12/2014  . Brain  tumor, astrocytoma ([place]) 01/23/2014  . 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 Paresthesias 09/20/2013  . Blurred vision, left eye 09/20/2013  . Asthma 07/24/2009  . CHEST PAIN 07/24/2009    Past Surgical History:  Procedure Laterality Date  .  ABDOMINAL HYSTERECTOMY    . CESAREAN SECTION    . COLONOSCOPY WITH PROPOFOL N/A 08/29/2012   Procedure: COLONOSCOPY WITH PROPOFOL;  Surgeon: Arta Silence, MD;  Location: [facility];  Service: Endoscopy;  Laterality: N/A;  . coposcopy  08/06/2012   cervical biopsy  . CRANIOTOMY Right 09/26/2013   Procedure: Awake Right frontal open brain biopsy;  Surgeon: Winfield Cunas, MD;  Location: [facility];  Service: Neurosurgery;  Laterality: Right;  awake Right frontal open brain biopsy  . CRANIOTOMY N/A 10/30/2013   Procedure: debridement of cranial wound  right temporal incision, ;  Surgeon: Winfield Cunas, MD;  Location: [facility];  Service: Neurosurgery;  Laterality: N/A;  . CRANIOTOMY N/A 01/23/2014   Procedure: Awake Craniotomy with BrainLab;  Surgeon: Winfield Cunas, MD;  Location: [facility];  Service: Neurosurgery;  Laterality: N/A;  Awake Craniotomy  tumor with BrainLab  . ESOPHAGOGASTRODUODENOSCOPY (EGD) WITH PROPOFOL N/A 08/29/2012   Procedure: ESOPHAGOGASTRODUODENOSCOPY (EGD) WITH PROPOFOL;  Surgeon: Arta Silence, MD;  Location: [facility];  Service: Endoscopy;  Laterality: N/A;  . left knee surgery  2000   torn meniscus  . right flank surgery  2005   bnign mass from flank area-right  . ROBOTIC ASSISTED TOTAL HYSTERECTOMY WITH BILATERAL SALPINGO OOPHERECTOMY Bilateral 09/25/2012   Procedure: ROBOTIC ASSISTED TOTAL HYSTERECTOMY WITH BILATERAL SALPINGO OOPHORECTOMY;  Surgeon: Maeola Sarah. Landry Mellow, MD;  Location: [facility];  Service: Gynecology;  Laterality: Bilateral;  . ROTATOR CUFF REPAIR    . WISDOM TOOTH EXTRACTION      OB History    No data available       Home Medications    Prior to Admission medications   Medication Sig Start Date End Date Taking? Authorizing Provider  butalbital-acetaminophen-caffeine (FIORICET, ESGIC) 50-325-40 MG tablet Take 1 tablet by mouth every 6 (six) hours as needed for headache. 11/28/16  Yes Alvy Bimler, Ni, MD  carisoprodol (SOMA) 350 MG tablet TAKE 1 TABLET  BY MOUTH FOUR TIMES DAILY FOR SEIZURE PREVENTION 10/28/16  Yes Heath Lark, MD  divalproex (DEPAKOTE ER) 250 MG 24 hr tablet Take 2 tablets twice a day 02/24/16  Yes Cameron Sprang, MD  dronabinol (MARINOL) 10 MG capsule Take 1 capsule (10 mg total) by mouth 2 (two) times daily. 11/18/16  Yes Gorsuch, Ni, MD  gabapentin (NEURONTIN) 300 MG capsule TAKE 3 CAPSULES BY MOUTH IN THE MORNING AND 3 CAPSULES IN THE EVENING 02/24/16  Yes Cameron Sprang, MD  HYDROcodone-acetaminophen (NORCO) 10-325 MG tablet Take 1 tablet by mouth every 4 (four) hours as needed. 12/07/16  Yes Gorsuch, Ni, MD  levocetirizine (XYZAL) 5 MG tablet Take 1 tablet (5 mg total) by mouth every evening. 03/01/16  Yes Bobbitt, [place] Muta, MD  lidocaine-prilocaine (EMLA) cream APPLY TO AFFECTED AREA(S) AS NEEDED 09/27/16  Yes Gorsuch, Ni, MD  LORazepam (ATIVAN) 1 MG tablet Take 1 tablet (1 mg total) by mouth every 8 (eight) hours as needed for anxiety or seizure. 11/18/16  Yes Gorsuch, Ni, MD  montelukast (SINGULAIR) 10 MG tablet TAKE 1 TABLET BY MOUTH AT BEDTIME 09/08/16  Yes Bobbitt, [place] Muta, MD  morphine (MSIR) 30 MG tablet Take 1 tablet (30  mg total) by mouth every 4 (four) hours as needed for severe pain. 08/30/16  Yes Gorsuch, Ni, MD  ondansetron (ZOFRAN ODT) 8 MG disintegrating tablet Take 1 tablet (8 mg total) by mouth every 8 (eight) hours as needed for nausea or vomiting. Patient not taking: Reported on 11/18/2016 02/08/16   Heath Lark, MD  promethazine (PHENERGAN) 25 MG tablet Take 1 tablet (25 mg total) by mouth every 6 (six) hours as needed for nausea or vomiting. Patient not taking: Reported on 11/18/2016 08/22/16   Heath Lark, MD    Family History Family History  Problem Relation Age of Onset  . Hypertension Mother   . Skin cancer Mother   . Thyroid cancer Father   . Skin cancer Father   . Breast cancer Maternal Aunt        dx in her 40s  . Prostate cancer Maternal Uncle 63  . Melanoma Maternal Uncle   . Ovarian cancer  Maternal Grandmother        dx in her late 59s-40s  . Breast cancer Maternal Aunt        bilateral breast cancer dx in 27s and 62s; possibly BRCA+  . Other Son        multiple CAL spots, bright spots on brain MRI, ? chiari malformation    Social History Social History  Substance Use Topics  . Smoking status: Never Smoker  . Smokeless tobacco: Never Used  . Alcohol use No     Allergies   Adhesive [tape]; Latex; Percocet [oxycodone-acetaminophen]; Shellfish allergy; Skin adhesives; Soma [carisoprodol]; Tramadol hcl; and Vancomycin   Review of Systems Review of Systems  Gastrointestinal: Positive for diarrhea and nausea. Negative for vomiting.  Neurological:       Multiple neurologic complaints per HPI  All other systems reviewed and are negative.    Physical Exam Updated Vital Signs BP 94/62   Pulse 72   Temp 98.3 F (36.8 C)   Resp 12   Ht '5\' 9"'$  (1.753 m)   Wt 68.7 kg (151 lb 7 oz)   LMP 09/20/2012   SpO2 100%   BMI 22.36 kg/m   Physical Exam  Constitutional: She is oriented to person, place, and time. She appears well-developed and well-nourished. No distress.  HENT:  Head: Normocephalic and atraumatic.  Right Ear: Hearing normal.  Left Ear: Hearing normal.  Nose: Nose normal.  Mouth/Throat: Oropharynx is clear and moist and mucous membranes are normal.  Eyes: Conjunctivae and EOM are normal. Pupils are equal, round, and reactive to light.  Neck: Normal range of motion. Neck supple.  Cardiovascular: Regular rhythm, S1 normal and S2 normal.  Exam reveals no gallop and no friction rub.   No murmur heard. Pulmonary/Chest: Effort normal and breath sounds normal. No respiratory distress. She exhibits no tenderness.  Abdominal: Soft. Normal appearance and bowel sounds are normal. There is no hepatosplenomegaly. There is no tenderness. There is no rebound, no guarding, no tenderness at McBurney's point and negative Murphy's sign. No hernia.  Musculoskeletal: Normal  range of motion.  Neurological: She is alert and oriented to person, place, and time. She has normal strength. No cranial nerve deficit or sensory deficit. Coordination normal. GCS eye subscore is 4. GCS verbal subscore is 5. GCS motor subscore is 6.  Slight deficit left side with pronator drift of left upper extremity and slight decrease in strength of raising left leg against resistance compared to right  Skin: Skin is warm, dry and intact. No rash noted. No  cyanosis.  Psychiatric: She has a normal mood and affect. Her speech is normal and behavior is normal. Thought content normal.  Nursing note and vitals reviewed.    ED Treatments / Results  Labs (all labs ordered are listed, but only abnormal results are displayed) Labs Reviewed  CBC - Abnormal; Notable for the following:       Result Value   WBC 3.7 (*)    RBC 3.49 (*)    Hemoglobin 11.4 (*)    HCT 33.5 (*)    All other components within normal limits  COMPREHENSIVE METABOLIC PANEL - Abnormal; Notable for the following:    BUN 5 (*)    ALT 12 (*)    All other components within normal limits  RAPID URINE DRUG SCREEN, HOSP PERFORMED - Abnormal; Notable for the following:    Benzodiazepines POSITIVE (*)    Amphetamines POSITIVE (*)    Tetrahydrocannabinol POSITIVE (*)    Barbiturates POSITIVE (*)    All other components within normal limits  URINALYSIS, ROUTINE W REFLEX MICROSCOPIC - Abnormal; Notable for the following:    Color, Urine STRAW (*)    Specific Gravity, Urine 1.003 (*)    All other components within normal limits  I-STAT CHEM 8, ED - Abnormal; Notable for the following:    Calcium, Ion 1.08 (*)    Hemoglobin 10.9 (*)    HCT 32.0 (*)    All other components within normal limits  I-STAT CHEM 8, ED - Abnormal; Notable for the following:    Glucose, Bld 64 (*)    Calcium, Ion 1.14 (*)    Hemoglobin 10.5 (*)    HCT 31.0 (*)    All other components within normal limits  ETHANOL  PROTIME-INR  APTT    DIFFERENTIAL  I-STAT TROPOININ, ED    EKG  EKG Interpretation  Date/Time:  Thursday Dec 15 2016 23:31:06 EDT Ventricular Rate:  77 PR Interval:    QRS Duration: 95 QT Interval:  408 QTC Calculation: 462 R Axis:   71 Text Interpretation:  Sinus rhythm Normal ECG Confirmed by Orpah Greek ([id number]) on 12/15/2016 11:58:51 PM       Radiology Mr Brain W And Wo Contrast  Result Date: 12/16/2016 CLINICAL DATA:  New onset left-sided weakness. History of oligodendroglioma, status post resection in 2015. EXAM: MRI HEAD WITHOUT AND WITH CONTRAST TECHNIQUE: Multiplanar, multiecho pulse sequences of the brain and surrounding structures were obtained without and with intravenous contrast. CONTRAST:  40m MULTIHANCE GADOBENATE DIMEGLUMINE 529 MG/ML IV SOLN COMPARISON:  Brain MRI 11/17/2016, 08/19/2016 FINDINGS: Brain: The midline structures are normal. There is no focal diffusion restriction to indicate acute infarct. The appearance of the right frontal resection cavity is unchanged. The surrounding hyperintense T2 weighted signal is unchanged. There is no surrounding contrast enhancement. No intraparenchymal hematoma or chronic microhemorrhage. Brain volume is normal for age without age-advanced or lobar predominant atrophy. The dura is normal and there is no extra-axial collection. Vascular: Major intracranial arterial and venous sinus flow voids are preserved. Skull and upper cervical spine: Remote right pterional craniotomy Sinuses/Orbits: No fluid levels or advanced mucosal thickening. No mastoid effusion. Normal orbits. IMPRESSION: 1. No acute intracranial abnormality. 2. Unchanged appearance of right frontal lobe resection cavity and surrounding hyperintense T2 weighted signal. Electronically Signed   By: KUlyses JarredM.D.   On: 12/16/2016 02:29   [place]domen Complete  Result Date: 12/15/2016 CLINICAL DATA:  Epigastric pain. EXAM: ABDOMEN ULTRASOUND COMPLETE COMPARISON:  MRI 11/29/2016.  FINDINGS:  Gallbladder: No gallstones noted. Gallbladder wall thickness 1.1 mm. Positive Murphy sign. Patient was extremely tender over the right upper quadrant by ultrasound exam. Common bile duct: Diameter: 5.9 mm Liver: A 2.1 x 1.5 x 1.7 cm hyperechoic liver lesion is noted. Only 1 liver lesion identified. Reference is made to recent MRI of the liver report of 11/29/2016 for discussion of liver lesions. Hepatic echotexture normal. IVC: No abnormality visualized. Pancreas: Visualized portion unremarkable. Spleen: Size and appearance within normal limits. Right Kidney: Length: 11.3 cm. Echogenicity within normal limits. No mass or hydronephrosis visualized. Left Kidney: Length: 11.4 cm. Echogenicity within normal limits. No mass or hydronephrosis visualized. Abdominal aorta: No aneurysm visualized. Other findings: None. IMPRESSION: 1. No gallstones or biliary distention. The patient was extremely tender over the right upper quadrant on ultrasound exam. No biliary distention. 2. 2.1 cm solid right hepatic lobe lesion. No other focal hepatic lesions were identified. Reference is made to MRI of the liver report of 11/29/2016. Electronically Signed   By: Maisie Fus  Register   On: 12/15/2016 12:44   Ct Head Code Stroke W/o Cm  Result Date: 12/15/2016 CLINICAL DATA:  Code stroke. History of brain tumor. Stroke-like symptoms. EXAM: CT HEAD WITHOUT CONTRAST TECHNIQUE: Contiguous axial images were obtained from the base of the skull through the vertex without intravenous contrast. COMPARISON:  Brain MRI 11/17/2016 FINDINGS: Brain: Right frontal resection cavity is unchanged compared to prior MRI. No hemorrhage or CT evidence of acute cortical infarct. No midline shift or other mass effect. No hydrocephalus or extra-axial collection. Vascular: No hyperdense vessel or unexpected calcification. Skull: Remote right pterional craniotomy. Sinuses/Orbits: No sinus fluid levels or advanced mucosal thickening. No mastoid effusion.  Normal orbits. ASPECTS [facility] Stroke Program Early CT Score) - Ganglionic level infarction (caudate, lentiform nuclei, internal capsule, insula, M1-M3 cortex): 7 - Supraganglionic infarction (M4-M6 cortex): 3 Total score (0-10 with 10 being normal): 10 IMPRESSION: 1. No acute intracranial abnormality. 2. Unchanged appearance of right frontal lobe resection cavity. 3. ASPECTS is 10. These results were called by telephone at the time of interpretation on 12/15/2016 at 11:33 pm to Dr. Ritta Slot , who verbally acknowledged these results. Electronically Signed   By: Deatra Robinson M.D.   On: 12/15/2016 23:33    Procedures Procedures (including critical care time)  Medications Ordered in ED Medications  sodium chloride 0.9 % bolus 1,000 mL (0 mLs Intravenous Stopped 12/16/16 0235)  prochlorperazine (COMPAZINE) injection 10 mg (10 mg Intravenous Given 12/16/16 0039)  diphenhydrAMINE (BENADRYL) injection 25 mg (25 mg Intravenous Given 12/16/16 0039)  gadobenate dimeglumine (MULTIHANCE) injection 15 mL (15 mLs Intravenous Contrast Given 12/16/16 0202)     Initial Impression / Assessment and Plan / ED Course  I have reviewed the triage vital signs and the nursing notes.  Pertinent labs & imaging results that were available during my care of the patient were reviewed by me and considered in my medical decision making (see chart for details).     Patient presented to the emergency department for evaluation of left-sided weakness. Patient has had previous craniotomy. She does have a seizure disorder. Patient is very aware of her previous seizures, however, says that this feels different.  Patient did have slight deficit noted on the left side at arrival. She was also experiencing a headache. Patient was evaluated by Dr. Amada Jupiter as well as myself. Patient has had GI symptoms ongoing for "years" but in the last few days has had increased diarrhea and likely is mildly dehydrated. Dr.  Leonel Ramsay felt  that the patient should undergo MRI with and without contrast. This was performed. Based on the fact that there is no change from previous, no evidence of edema, new stroke, etc., he feels the patient does not require hospitalization. Because the patient was having a headache, compensated migraine was considered. Patient treated with Compazine and Benadryl. Headache has improved and her weakness and numbness on the left side has also improved. At this time she is appropriate for discharge.  Final Clinical Impressions(s) / ED Diagnoses   Final diagnoses:  Complicated migraine    New Prescriptions New Prescriptions   No medications on file     Orpah Greek, MD 12/16/16 [phone]

## 2016-12-16 ENCOUNTER — Emergency Department (HOSPITAL_COMMUNITY): Payer: Medicare Other

## 2016-12-16 DIAGNOSIS — G43809 Other migraine, not intractable, without status migrainosus: Secondary | ICD-10-CM | POA: Diagnosis not present

## 2016-12-16 DIAGNOSIS — R531 Weakness: Secondary | ICD-10-CM | POA: Diagnosis not present

## 2016-12-16 LAB — I-STAT CHEM 8, ED
BUN: 6 mg/dL (ref 6–20)
Calcium, Ion: 1.14 mmol/L — ABNORMAL LOW (ref 1.15–1.40)
Chloride: 103 mmol/L (ref 101–111)
Creatinine, Ser: 0.6 mg/dL (ref 0.44–1.00)
Glucose, Bld: 64 mg/dL — ABNORMAL LOW (ref 65–99)
HCT: 31 % — ABNORMAL LOW (ref 36.0–46.0)
Hemoglobin: 10.5 g/dL — ABNORMAL LOW (ref 12.0–15.0)
Potassium: 3.8 mmol/L (ref 3.5–5.1)
Sodium: 139 mmol/L (ref 135–145)
TCO2: 26 mmol/L (ref 0–100)

## 2016-12-16 LAB — RAPID URINE DRUG SCREEN, HOSP PERFORMED
Amphetamines: POSITIVE — AB
Barbiturates: POSITIVE — AB
Benzodiazepines: POSITIVE — AB
Cocaine: NOT DETECTED
Opiates: NOT DETECTED
Tetrahydrocannabinol: POSITIVE — AB

## 2016-12-16 LAB — URINALYSIS, ROUTINE W REFLEX MICROSCOPIC
Bilirubin Urine: NEGATIVE
Glucose, UA: NEGATIVE mg/dL
Hgb urine dipstick: NEGATIVE
Ketones, ur: NEGATIVE mg/dL
Leukocytes, UA: NEGATIVE
Nitrite: NEGATIVE
Protein, ur: NEGATIVE mg/dL
Specific Gravity, Urine: 1.003 — ABNORMAL LOW (ref 1.005–1.030)
pH: 8 (ref 5.0–8.0)

## 2016-12-16 MED ORDER — GADOBENATE DIMEGLUMINE 529 MG/ML IV SOLN
15.0000 mL | Freq: Once | INTRAVENOUS | Status: AC | PRN
  Administered 2016-12-16: 15 mL via INTRAVENOUS

## 2016-12-16 MED ORDER — HEPARIN SOD (PORK) LOCK FLUSH 100 UNIT/ML IV SOLN
500.0000 [IU] | Freq: Once | INTRAVENOUS | Status: AC
  Administered 2016-12-16: 500 [IU]
  Filled 2016-12-16: qty 5

## 2016-12-16 MED FILL — CARISOPRODOL 350 MG TABLET: 350 | 23 days supply | Qty: 90 | Fill #2

## 2016-12-16 NOTE — ED Notes (Signed)
Pt transported to MRI 

## 2016-12-16 NOTE — ED Notes (Signed)
Code stroke cancelled 

## 2016-12-16 NOTE — ED Notes (Signed)
Pt back from MRI 

## 2016-12-19 ENCOUNTER — Telehealth: Payer: Self-pay | Admitting: Hematology and Oncology

## 2016-12-19 ENCOUNTER — Telehealth: Payer: Self-pay | Admitting: *Deleted

## 2016-12-19 ENCOUNTER — Ambulatory Visit (HOSPITAL_BASED_OUTPATIENT_CLINIC_OR_DEPARTMENT_OTHER): Payer: Medicare Other | Admitting: Hematology and Oncology

## 2016-12-19 VITALS — BP 201/100 | HR 99 | Temp 98.4°F | Resp 20 | Ht 69.0 in | Wt 147.5 lb

## 2016-12-19 DIAGNOSIS — R4189 Other symptoms and signs involving cognitive functions and awareness: Secondary | ICD-10-CM | POA: Diagnosis not present

## 2016-12-19 DIAGNOSIS — G40109 Localization-related (focal) (partial) symptomatic epilepsy and epileptic syndromes with simple partial seizures, not intractable, without status epilepticus: Secondary | ICD-10-CM

## 2016-12-19 DIAGNOSIS — R110 Nausea: Secondary | ICD-10-CM

## 2016-12-19 DIAGNOSIS — C711 Malignant neoplasm of frontal lobe: Secondary | ICD-10-CM

## 2016-12-19 DIAGNOSIS — D61810 Antineoplastic chemotherapy induced pancytopenia: Secondary | ICD-10-CM

## 2016-12-19 DIAGNOSIS — D3502 Benign neoplasm of left adrenal gland: Secondary | ICD-10-CM

## 2016-12-19 DIAGNOSIS — T451X5A Adverse effect of antineoplastic and immunosuppressive drugs, initial encounter: Secondary | ICD-10-CM

## 2016-12-19 DIAGNOSIS — G44021 Chronic cluster headache, intractable: Secondary | ICD-10-CM | POA: Diagnosis not present

## 2016-12-19 MED ORDER — HYDROCODONE-ACETAMINOPHEN 10-325 MG PO TABS: 1.0000 | ORAL_TABLET | ORAL | 0 refills | Status: DC | PRN

## 2016-12-19 MED ORDER — LORAZEPAM 1 MG PO TABS: 1.0000 mg | ORAL_TABLET | Freq: Three times a day (TID) | ORAL | 0 refills | Status: DC | PRN

## 2016-12-19 MED ORDER — DRONABINOL 10 MG PO CAPS: 10.0000 mg | ORAL_CAPSULE | Freq: Two times a day (BID) | ORAL | 3 refills | Status: DC

## 2016-12-19 MED ORDER — PROCHLORPERAZINE MALEATE 10 MG PO TABS: 10.0000 mg | ORAL_TABLET | Freq: Four times a day (QID) | ORAL | 0 refills | Status: DC | PRN

## 2016-12-19 MED FILL — PROCHLORPERAZINE 10 MG TAB: 10 | 15 days supply | Qty: 60 | Fill #0

## 2016-12-19 MED FILL — DRONABINOL 10 MG CAP: 10 | 30 days supply | Qty: 60 | Fill #1

## 2016-12-19 MED FILL — LORazepam 1 MG TABS: 1 | 30 days supply | Qty: 90 | Fill #0

## 2016-12-19 NOTE — Assessment & Plan Note (Signed)
The patient has recurrent seizures. She will continue anti-seizure medications as prescribed The patient also have chronic nausea, anxiety and depression. Previously, while she was on lorazepam during chemotherapy, the frequency of seizures are less I recommend she takes lorazepam on a regular basis for nausea control along with seizure prevention.  I refill her prescription today

## 2016-12-19 NOTE — Telephone Encounter (Signed)
Scheduled appt per 6/4 los. - Gave patient AVS and calender.

## 2016-12-19 NOTE — Assessment & Plan Note (Signed)
She has significant concern about the left adrenal adenoma detected on recent MRI. Her gastroenterologist have ordered some blood tests including recent cortisol level that came back abnormal The patient initially requested referral to see an endocrinologist from [facility] but today she expressed desire to have all her appointments consolidated within the [facility] practice and requested referral to see Dr. Buddy Duty I will place referral per patient request

## 2016-12-19 NOTE — Assessment & Plan Note (Signed)
The patient requested change of anti-emetics. We will continue Zofran but per patient request I would discontinue Phenergan and switch her to Compazine I warned her about risk of sedation

## 2016-12-19 NOTE — Assessment & Plan Note (Addendum)
The patient takes Fioricet on a regular basis along with pain medications I refill her prescriptions today. She will take hydrocodone only and I have discontinue morphine sulfate

## 2016-12-19 NOTE — Progress Notes (Signed)
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      09/27/2013 Pathology Results    Accession: [id number] pathology showed grade 2 oligodendroglioma with mutant IDH1 protein, ATRX, OLIG 2 expression. p53 is negative, low Ki-67 labeling index. 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      11/06/2013 Imaging    Repeat CT scan of the head showed resolution of abscess.      01/23/2014 Surgery    Dr. Christella Noa perform a subtotal gross resection of the tumor.      01/24/2014 Imaging    Repeat imaging studies showed persistent and residual disease.      03/03/2014 - 09/01/2014 Chemotherapy    She was started on Temodar, 5 days every 28 days      03/13/2014 Imaging    Repeat MRI showed no evidence of progression of disease.      07/14/2014 Imaging    Repeat MRI showed no evidence of disease progression.      09/12/2014 Imaging    MRI of the liver show mild regression of the size of lesions, presumed to be benign      09/15/2014 Imaging    Repeat MRI of the brain show persistent abnormalities, suspicious for residual disease      03/02/2015 Imaging     MRI brain at [facility] show persistent abnormalities      08/10/2015 Imaging    Repeat MRI [facility] showed stable disease      08/24/2015 - 02/08/2016 Chemotherapy     She was started on Lomustine, Procarbazine and Vincristine chemotherapy      08/30/2015 Imaging    CT chest showed no evidence of aspiration pneumonia      11/18/2015 Imaging    MRI brain showed status post resection of right frontal lobe tumor without residual or recurrent tumor. 2. Otherwise negative MRI of the brain.       03/02/2016 Imaging    Stable MRI post right frontal lobe tumor resection. Negative for recurrent tumor      06/01/2016 Imaging    Stable MRI post right frontal lobe tumor resection. No recurrent tumor identified      08/19/2016 Imaging    Unchanged examination without tumor recurrence.      11/17/2016 Imaging    Stable right frontal resection cavity. Stable focal mass effect within the right middle frontal gyrus at the superior margin of the cavity which may represent nonenhancing neoplasm. No evidence for tumor progression. Otherwise stable MRI of the brain      12/16/2016 Imaging    MR brain: 1. No acute intracranial abnormality. 2. Unchanged appearance of right frontal lobe resection cavity and surrounding hyperintense T2 weighted signal.       INTERVAL HISTORY: Please see below for problem oriented charting. She is seen today with her son She was in the emergency room recently due to unexplained  spells of neurological deficit and numbness.  She had emergency room visit with blood work, MRI and other testing. After IVF and anti-emetics and her symptoms had improved She did not remember taking most of her medications as prescribed She has not needed morphine sulfate but just uses Fioricet and hydrocodone for pain control. She had persistent nausea but felt that Compazine may have relieved her nausea more than Zofran. She felt that her recent spell could have been triggered by stress of selling her house and moving. She is concerned about recent MRI findings of left adrenal adenoma and requests a endocrinologist referral.  REVIEW OF SYSTEMS:    Constitutional: Denies fevers, chills or abnormal weight loss Eyes: Denies blurriness of vision Ears, nose, mouth, throat, and face: Denies mucositis or sore throat Respiratory: Denies cough, dyspnea or wheezes Cardiovascular: Denies palpitation, chest discomfort or lower extremity swelling Gastrointestinal:  Denies nausea, heartburn or change in bowel habits Skin: Denies abnormal skin rashes Lymphatics: Denies new lymphadenopathy or easy bruising Behavioral/Psych: Mood is stable, no new changes  All other systems were reviewed with the patient and are negative.  I have reviewed the past medical history, past surgical history, social history and family history with the patient and they are unchanged from previous note.  ALLERGIES:  is allergic to adhesive [tape]; latex; percocet [oxycodone-acetaminophen]; shellfish allergy; skin adhesives; soma [carisoprodol]; tramadol hcl; and vancomycin.  MEDICATIONS:  Current Outpatient Prescriptions  Medication Sig Dispense Refill  . ALPRAZolam (XANAX) 1 MG tablet TAKE 1.5 TABLETS AT BEDTIME  1  . butalbital-acetaminophen-caffeine (FIORICET, ESGIC) 50-325-40 MG tablet Take 1 tablet by mouth every 6 (six) hours as needed for headache. 90 tablet 0  . carisoprodol (SOMA) 350 MG tablet TAKE 1 TABLET BY MOUTH FOUR TIMES DAILY FOR SEIZURE PREVENTION 90 tablet 3  . divalproex (DEPAKOTE ER) 250 MG 24 hr tablet Take 2 tablets twice a day 360 tablet 3  . dronabinol (MARINOL) 10 MG capsule Take 1 capsule (10 mg total) by mouth 2 (two) times daily. 60 capsule 3  . gabapentin (NEURONTIN) 300 MG capsule TAKE 3 CAPSULES BY MOUTH IN THE MORNING AND 3 CAPSULES IN THE EVENING 540 capsule 3  . HYDROcodone-acetaminophen (NORCO) 10-325 MG tablet Take 1 tablet by mouth every 4 (four) hours as needed. 90 tablet 0  . levocetirizine (XYZAL) 5 MG tablet Take 1 tablet (5 mg total) by mouth every evening. 60 tablet 5  . lidocaine-prilocaine (EMLA) cream APPLY TO AFFECTED AREA(S)  AS NEEDED 30 g 6  . LORazepam (ATIVAN) 1 MG tablet Take 1 tablet (1 mg total) by mouth every 8 (eight) hours as needed for anxiety or seizure. 90 tablet 0  . montelukast (SINGULAIR) 10 MG tablet TAKE 1 TABLET BY MOUTH AT BEDTIME 30 tablet 0  . sucralfate (CARAFATE) 1 g tablet   2  . ondansetron (ZOFRAN ODT) 8 MG disintegrating tablet Take 1 tablet (8 mg total) by mouth every 8 (eight) hours as needed for nausea or vomiting. (Patient not taking: Reported on 11/18/2016) 90 tablet 3  . prochlorperazine (COMPAZINE) 10 MG tablet Take 1 tablet (10 mg total) by mouth every 6 (six) hours as needed for nausea or vomiting. 60 tablet 0   No current facility-administered medications for this visit.     PHYSICAL EXAMINATION: ECOG PERFORMANCE STATUS: 1 - Symptomatic but completely ambulatory  Vitals:   12/19/16 1338  BP: (!) 201/100  Pulse: 99  Resp: 20  Temp: 98.4 F (36.9 C)   Filed Weights  12/19/16 1338  Weight: 147 lb 8 oz (66.9 kg)    GENERAL:alert, no distress and comfortable SKIN: skin color, texture, turgor are normal, no rashes or significant lesions EYES: normal, Conjunctiva are pink and non-injected, sclera clear Musculoskeletal:no cyanosis of digits and no clubbing  NEURO: alert & oriented x 3 with fluent speech, no focal motor/sensory deficits  LABORATORY DATA:  I have reviewed the data as listed    Component Value Date/Time   NA 139 12/16/2016 0348   NA 140 11/17/2016 1543   K 3.8 12/16/2016 0348   K 4.3 11/17/2016 1543   CL 103 12/16/2016 0348   CO2 24 12/15/2016 2314   CO2 27 11/17/2016 1543   GLUCOSE 64 (L) 12/16/2016 0348   GLUCOSE 91 11/17/2016 1543   BUN 6 12/16/2016 0348   BUN 5.7 (L) 11/17/2016 1543   CREATININE 0.60 12/16/2016 0348   CREATININE 0.7 11/17/2016 1543   CALCIUM 9.2 12/15/2016 2314   CALCIUM 8.6 11/17/2016 1543   PROT 6.8 12/15/2016 2314   PROT 6.4 11/17/2016 1543   ALBUMIN 4.1 12/15/2016 2314   ALBUMIN 3.8 11/17/2016 1543   AST 17  12/15/2016 2314   AST 11 11/17/2016 1543   ALT 12 (L) 12/15/2016 2314   ALT 10 11/17/2016 1543   ALKPHOS 38 12/15/2016 2314   ALKPHOS 44 11/17/2016 1543   BILITOT 0.6 12/15/2016 2314   BILITOT 0.26 11/17/2016 1543   GFRNONAA >60 12/15/2016 2314   GFRAA >60 12/15/2016 2314    No results found for: SPEP, UPEP  Lab Results  Component Value Date   WBC 3.7 (L) 12/15/2016   NEUTROABS 1.9 12/15/2016   HGB 10.5 (L) 12/16/2016   HCT 31.0 (L) 12/16/2016   MCV 96.0 12/15/2016   PLT 179 12/15/2016      Chemistry      Component Value Date/Time   NA 139 12/16/2016 0348   NA 140 11/17/2016 1543   K 3.8 12/16/2016 0348   K 4.3 11/17/2016 1543   CL 103 12/16/2016 0348   CO2 24 12/15/2016 2314   CO2 27 11/17/2016 1543   BUN 6 12/16/2016 0348   BUN 5.7 (L) 11/17/2016 1543   CREATININE 0.60 12/16/2016 0348   CREATININE 0.7 11/17/2016 1543      Component Value Date/Time   CALCIUM 9.2 12/15/2016 2314   CALCIUM 8.6 11/17/2016 1543   ALKPHOS 38 12/15/2016 2314   ALKPHOS 44 11/17/2016 1543   AST 17 12/15/2016 2314   AST 11 11/17/2016 1543   ALT 12 (L) 12/15/2016 2314   ALT 10 11/17/2016 1543   BILITOT 0.6 12/15/2016 2314   BILITOT 0.26 11/17/2016 1543       RADIOGRAPHIC STUDIES: I have personally reviewed the radiological images as listed and agreed with the findings in the report. Mr Jeri Cos And Wo Contrast  Result Date: 12/16/2016 CLINICAL DATA:  New onset left-sided weakness. History of oligodendroglioma, status post resection in 2015. EXAM: MRI HEAD WITHOUT AND WITH CONTRAST TECHNIQUE: Multiplanar, multiecho pulse sequences of the brain and surrounding structures were obtained without and with intravenous contrast. CONTRAST:  31m MULTIHANCE GADOBENATE DIMEGLUMINE 529 MG/ML IV SOLN COMPARISON:  Brain MRI 11/17/2016, 08/19/2016 FINDINGS: Brain: The midline structures are normal. There is no focal diffusion restriction to indicate acute infarct. The appearance of the right frontal  resection cavity is unchanged. The surrounding hyperintense T2 weighted signal is unchanged. There is no surrounding contrast enhancement. No intraparenchymal hematoma or chronic microhemorrhage. Brain volume is normal for age without  age-advanced or lobar predominant atrophy. The dura is normal and there is no extra-axial collection. Vascular: Major intracranial arterial and venous sinus flow voids are preserved. Skull and upper cervical spine: Remote right pterional craniotomy Sinuses/Orbits: No fluid levels or advanced mucosal thickening. No mastoid effusion. Normal orbits. IMPRESSION: 1. No acute intracranial abnormality. 2. Unchanged appearance of right frontal lobe resection cavity and surrounding hyperintense T2 weighted signal. Electronically Signed   By: Ulyses Jarred M.D.   On: 12/16/2016 02:29   Mr Pelvis W Wo Contrast  Result Date: 11/30/2016 CLINICAL DATA:  Chronic abdominal pain. Pain after eating. Loss of appetite. Nausea vomiting and bloody stool. EXAM: MRI ABDOMEN AND PELVIS WITHOUT AND WITH CONTRAST TECHNIQUE: Multiplanar multisequence MR imaging of the abdomen and pelvis was performed both before and after the administration of intravenous contrast. CONTRAST:  9m MULTIHANCE GADOBENATE DIMEGLUMINE 529 MG/ML IV SOLN COMPARISON:  None. FINDINGS: COMBINED FINDINGS FOR BOTH MR ABDOMEN AND PELVIS Lower chest: Lung bases are clear. Hepatobiliary: Again demonstrated early enhancing lesion is in the posterior RIGHT hepatic lobe. Largest lesion measures 15 mm x 13 mm which is not changed to slightly smaller than 18 mm x 19 mm on comparison exam. Smaller superior lesion measures 9 mm compared to 6 mm for no significant change. Tiny lesion in the LEFT lateral hepatic lobe is not well appreciated. No new lesions are present. These lesions become essentially isointense to the hepatic parenchyma on the delayed imaging. Lesions are not evident on the T2 weighted sequence. Again the findings suggest focal  nodular hyperplasia. Liver is normal signal intensity without evidence of hepatic steatosis. The gallbladder is normal. Common bile duct is prominent 9 mm. No change from prior. New choledocholithiasis. Pancreas: Normal pancreatic intensity. No variant ductal anatomy. No inflammation. Spleen:  Normal spleen Adrenals/Urinary Tract: Small adenoma of the LEFT adrenal gland measures 11 mm (image 33, series 20). No enhancing renal lesion. Stomach/Bowel: Stomach limited view of the bowel is unremarkable. Vascular/Lymphatic:  Commonly normal caliber.  No lymphadenopathy. Reproductive: Ovaries are normal. Post hysterectomy anatomy. Bladder is normal. Other:  No free-fluid in the abdomen pelvis. Musculoskeletal: No aggressive osseous lesion IMPRESSION: 1. No acute findings in the abdomen pelvis. No explanation for abdominopelvic pain. 2. Stable enhancing lesions within the liver most consistent with focal nodular hyperplasia. 3. Normal gallbladder. Prominent common bile duct. No obstructing lesion identified. 4. Normal pancreas. 5. Benign LEFT adrenal adenoma. 6. Normal ovaries.  Post hysterectomy. 7. No abnormality evident on MRI examination of the bowel. Electronically Signed   By: SSuzy BouchardM.D.   On: 11/30/2016 07:37   Mr Abdomen Wwo Contrast  Result Date: 11/30/2016 CLINICAL DATA:  Chronic abdominal pain. Pain after eating. Loss of appetite. Nausea vomiting and bloody stool. EXAM: MRI ABDOMEN AND PELVIS WITHOUT AND WITH CONTRAST TECHNIQUE: Multiplanar multisequence MR imaging of the abdomen and pelvis was performed both before and after the administration of intravenous contrast. CONTRAST:  180mMULTIHANCE GADOBENATE DIMEGLUMINE 529 MG/ML IV SOLN COMPARISON:  None. FINDINGS: COMBINED FINDINGS FOR BOTH MR ABDOMEN AND PELVIS Lower chest: Lung bases are clear. Hepatobiliary: Again demonstrated early enhancing lesion is in the posterior RIGHT hepatic lobe. Largest lesion measures 15 mm x 13 mm which is not  changed to slightly smaller than 18 mm x 19 mm on comparison exam. Smaller superior lesion measures 9 mm compared to 6 mm for no significant change. Tiny lesion in the LEFT lateral hepatic lobe is not well appreciated. No new lesions are present. These lesions  become essentially isointense to the hepatic parenchyma on the delayed imaging. Lesions are not evident on the T2 weighted sequence. Again the findings suggest focal nodular hyperplasia. Liver is normal signal intensity without evidence of hepatic steatosis. The gallbladder is normal. Common bile duct is prominent 9 mm. No change from prior. New choledocholithiasis. Pancreas: Normal pancreatic intensity. No variant ductal anatomy. No inflammation. Spleen:  Normal spleen Adrenals/Urinary Tract: Small adenoma of the LEFT adrenal gland measures 11 mm (image 33, series 20). No enhancing renal lesion. Stomach/Bowel: Stomach limited view of the bowel is unremarkable. Vascular/Lymphatic:  Commonly normal caliber.  No lymphadenopathy. Reproductive: Ovaries are normal. Post hysterectomy anatomy. Bladder is normal. Other:  No free-fluid in the abdomen pelvis. Musculoskeletal: No aggressive osseous lesion IMPRESSION: 1. No acute findings in the abdomen pelvis. No explanation for abdominopelvic pain. 2. Stable enhancing lesions within the liver most consistent with focal nodular hyperplasia. 3. Normal gallbladder. Prominent common bile duct. No obstructing lesion identified. 4. Normal pancreas. 5. Benign LEFT adrenal adenoma. 6. Normal ovaries.  Post hysterectomy. 7. No abnormality evident on MRI examination of the bowel. Electronically Signed   By: Suzy Bouchard M.D.   On: 11/30/2016 07:37   US Abdomen Complete  Result Date: 12/15/2016 CLINICAL DATA:  Epigastric pain. EXAM: ABDOMEN ULTRASOUND COMPLETE COMPARISON:  MRI 11/29/2016. FINDINGS: Gallbladder: No gallstones noted. Gallbladder wall thickness 1.1 mm. Positive Murphy sign. Patient was extremely tender over  the right upper quadrant by ultrasound exam. Common bile duct: Diameter: 5.9 mm Liver: A 2.1 x 1.5 x 1.7 cm hyperechoic liver lesion is noted. Only 1 liver lesion identified. Reference is made to recent MRI of the liver report of 11/29/2016 for discussion of liver lesions. Hepatic echotexture normal. IVC: No abnormality visualized. Pancreas: Visualized portion unremarkable. Spleen: Size and appearance within normal limits. Right Kidney: Length: 11.3 cm. Echogenicity within normal limits. No mass or hydronephrosis visualized. Left Kidney: Length: 11.4 cm. Echogenicity within normal limits. No mass or hydronephrosis visualized. Abdominal aorta: No aneurysm visualized. Other findings: None. IMPRESSION: 1. No gallstones or biliary distention. The patient was extremely tender over the right upper quadrant on ultrasound exam. No biliary distention. 2. 2.1 cm solid right hepatic lobe lesion. No other focal hepatic lesions were identified. Reference is made to MRI of the liver report of 11/29/2016. Electronically Signed   By: Marcello Moores  Register   On: 12/15/2016 12:44   Ct Head Code Stroke W/o Cm  Result Date: 12/15/2016 CLINICAL DATA:  Code stroke. History of brain tumor. Stroke-like symptoms. EXAM: CT HEAD WITHOUT CONTRAST TECHNIQUE: Contiguous axial images were obtained from the base of the skull through the vertex without intravenous contrast. COMPARISON:  Brain MRI 11/17/2016 FINDINGS: Brain: Right frontal resection cavity is unchanged compared to prior MRI. No hemorrhage or CT evidence of acute cortical infarct. No midline shift or other mass effect. No hydrocephalus or extra-axial collection. Vascular: No hyperdense vessel or unexpected calcification. Skull: Remote right pterional craniotomy. Sinuses/Orbits: No sinus fluid levels or advanced mucosal thickening. No mastoid effusion. Normal orbits. ASPECTS [facility] Stroke Program Early CT Score) - Ganglionic level infarction (caudate, lentiform nuclei, internal  capsule, insula, M1-M3 cortex): 7 - Supraganglionic infarction (M4-M6 cortex): 3 Total score (0-10 with 10 being normal): 10 IMPRESSION: 1. No acute intracranial abnormality. 2. Unchanged appearance of right frontal lobe resection cavity. 3. ASPECTS is 10. These results were called by telephone at the time of interpretation on 12/15/2016 at 11:33 pm to Dr. Roland Rack , who verbally acknowledged these results. Electronically  Signed   By: Ulyses Jarred M.D.   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 I recommend observation with surveillance imaging every 3 months. I will bring her in every 4-6 weeks for port flushes and blood draw  Pancytopenia due to antineoplastic chemotherapy [facility]) She has prolonged pancytopenia related to residual side effects from chemotherapy Overall, the pancytopenia is improving.  Continue close surveillance/observation  Adenoma of left adrenal gland She has significant concern about the left adrenal adenoma detected on recent MRI. Her gastroenterologist have ordered some blood tests including recent cortisol level that came back abnormal The patient initially requested referral to see an endocrinologist from [facility] but today she expressed desire to have all her appointments consolidated within the [facility] practice and requested referral to see Dr. Buddy Duty I will place referral per patient request  Intractable chronic cluster headache The patient takes Fioricet on a regular basis along with pain medications I refill her prescriptions today. 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 She will continue anti-seizure medications as prescribed The patient also have chronic nausea, anxiety and depression. Previously, while  she was on lorazepam during chemotherapy, the frequency of seizures are less I recommend she takes lorazepam on a regular basis for nausea control along with seizure prevention.  I refill her prescription today  Nausea without vomiting The patient requested change of anti-emetics. We will continue Zofran but per patient request I would discontinue Phenergan and switch her to Compazine I warned her about risk of sedation  Cognitive impairment I reviewed the report from neuropsychiatric assessments. The patient had significant memory impairment as a result of her prior surgery, her disease, her seizure medications and her recurrent seizures She appears to be coping well with writing down information and reminders I have also prescribed Marinol recently and she felt that this has helped her to cope and focus better   No orders of the defined types were placed in this encounter.  All questions were answered. The patient knows to call the clinic with any problems, questions or concerns. No barriers to learning was detected. I spent 20 minutes counseling the patient face to face. The total time spent in the appointment was 25 minutes and more than 50% was on counseling and review of test results     Heath Lark, MD 12/19/2016 3:02 PM

## 2016-12-19 NOTE — Telephone Encounter (Signed)
Referral called to Dr Buddy Duty.

## 2016-12-19 NOTE — Assessment & Plan Note (Signed)
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  Continue close surveillance/observation

## 2016-12-19 NOTE — Assessment & Plan Note (Signed)
The MRIs show stable disease with no changes. I recommend observation with surveillance imaging every 3 months. I will bring her in every 4-6 weeks for port flushes and blood draw

## 2016-12-19 NOTE — Assessment & Plan Note (Signed)
I reviewed the report from neuropsychiatric assessments. The patient had significant memory impairment as a result of her prior surgery, her disease, her seizure medications and her recurrent seizures She appears to be coping well with writing down information and reminders I have also prescribed Marinol recently and she felt that this has helped her to cope and focus better

## 2016-12-20 DIAGNOSIS — R1011 Right upper quadrant pain: Secondary | ICD-10-CM | POA: Diagnosis not present

## 2016-12-20 DIAGNOSIS — R6881 Early satiety: Secondary | ICD-10-CM | POA: Diagnosis not present

## 2016-12-20 DIAGNOSIS — R1013 Epigastric pain: Secondary | ICD-10-CM | POA: Diagnosis not present

## 2016-12-20 MED FILL — HYDROCODON-APAP 10-325: 10-325 | 15 days supply | Qty: 90 | Fill #0

## 2016-12-21 ENCOUNTER — Telehealth: Payer: Self-pay | Admitting: *Deleted

## 2016-12-21 DIAGNOSIS — L750 Bromhidrosis: Secondary | ICD-10-CM | POA: Diagnosis not present

## 2016-12-21 DIAGNOSIS — D649 Anemia, unspecified: Secondary | ICD-10-CM | POA: Diagnosis not present

## 2016-12-21 DIAGNOSIS — Z92241 Personal history of systemic steroid therapy: Secondary | ICD-10-CM | POA: Diagnosis not present

## 2016-12-21 DIAGNOSIS — R7989 Other specified abnormal findings of blood chemistry: Secondary | ICD-10-CM | POA: Diagnosis not present

## 2016-12-21 DIAGNOSIS — D3502 Benign neoplasm of left adrenal gland: Secondary | ICD-10-CM | POA: Diagnosis not present

## 2016-12-21 DIAGNOSIS — R61 Generalized hyperhidrosis: Secondary | ICD-10-CM | POA: Diagnosis not present

## 2016-12-21 DIAGNOSIS — R8290 Unspecified abnormal findings in urine: Secondary | ICD-10-CM | POA: Diagnosis not present

## 2016-12-21 NOTE — Telephone Encounter (Signed)
Victoria Bates left a message stating she had an Endo with Victoria Bates. He checked her cortisol level and it was "very low, non-existent" Level 1.0. Wanted to know status of referral to [facility] Buddy Duty.   Leroy wants to know if this could have been what caused her problem that sent her to the ED.  RN called Victoria Cindra Eves office with this new information.

## 2016-12-29 ENCOUNTER — Other Ambulatory Visit: Payer: Self-pay | Admitting: Hematology and Oncology

## 2016-12-29 MED FILL — BUTALB-ACETAMIN-CAFF 50-325: 50-325-40 | 22 days supply | Qty: 90 | Fill #0

## 2017-01-03 DIAGNOSIS — R002 Palpitations: Secondary | ICD-10-CM | POA: Diagnosis not present

## 2017-01-03 DIAGNOSIS — R351 Nocturia: Secondary | ICD-10-CM | POA: Diagnosis not present

## 2017-01-03 DIAGNOSIS — L750 Bromhidrosis: Secondary | ICD-10-CM | POA: Diagnosis not present

## 2017-01-03 DIAGNOSIS — D649 Anemia, unspecified: Secondary | ICD-10-CM | POA: Diagnosis not present

## 2017-01-03 DIAGNOSIS — R8290 Unspecified abnormal findings in urine: Secondary | ICD-10-CM | POA: Diagnosis not present

## 2017-01-03 DIAGNOSIS — R358 Other polyuria: Secondary | ICD-10-CM | POA: Diagnosis not present

## 2017-01-03 DIAGNOSIS — Z92241 Personal history of systemic steroid therapy: Secondary | ICD-10-CM | POA: Diagnosis not present

## 2017-01-03 DIAGNOSIS — R7989 Other specified abnormal findings of blood chemistry: Secondary | ICD-10-CM | POA: Diagnosis not present

## 2017-01-03 DIAGNOSIS — D3502 Benign neoplasm of left adrenal gland: Secondary | ICD-10-CM | POA: Diagnosis not present

## 2017-01-03 DIAGNOSIS — R61 Generalized hyperhidrosis: Secondary | ICD-10-CM | POA: Diagnosis not present

## 2017-01-03 DIAGNOSIS — R631 Polydipsia: Secondary | ICD-10-CM | POA: Diagnosis not present

## 2017-01-04 MED FILL — LEVOCETIRIZINE 5 MG TABLET: 5 | 30 days supply | Qty: 30 | Fill #6

## 2017-01-05 ENCOUNTER — Other Ambulatory Visit: Payer: Self-pay | Admitting: Hematology and Oncology

## 2017-01-05 ENCOUNTER — Telehealth: Payer: Self-pay

## 2017-01-05 MED ORDER — HYDROCODONE-ACETAMINOPHEN 10-325 MG PO TABS
1.0000 | ORAL_TABLET | ORAL | 0 refills | Status: DC | PRN
Start: 2017-01-05 — End: 2017-01-17

## 2017-01-05 NOTE — Telephone Encounter (Signed)
Pt called for hydrocodone refill 

## 2017-01-05 NOTE — Telephone Encounter (Signed)
Ready for pick up

## 2017-01-05 NOTE — Telephone Encounter (Signed)
Called and left message that Rx is ready for pickup. 

## 2017-01-06 ENCOUNTER — Other Ambulatory Visit: Payer: Self-pay | Admitting: Hematology and Oncology

## 2017-01-06 DIAGNOSIS — R0789 Other chest pain: Secondary | ICD-10-CM

## 2017-01-06 DIAGNOSIS — R202 Paresthesia of skin: Secondary | ICD-10-CM

## 2017-01-06 DIAGNOSIS — R519 Headache, unspecified: Secondary | ICD-10-CM

## 2017-01-06 DIAGNOSIS — R4702 Dysphasia: Secondary | ICD-10-CM

## 2017-01-06 DIAGNOSIS — C711 Malignant neoplasm of frontal lobe: Secondary | ICD-10-CM

## 2017-01-06 DIAGNOSIS — R110 Nausea: Secondary | ICD-10-CM

## 2017-01-06 DIAGNOSIS — R51 Headache: Secondary | ICD-10-CM

## 2017-01-06 MED FILL — CARISOPRODOL 350 MG TABLET: 350 | 10 days supply | Qty: 40 | Fill #3

## 2017-01-06 MED FILL — HYDROCODON-APAP 10-325: 10-325 | 15 days supply | Qty: 90 | Fill #0

## 2017-01-09 MED FILL — CARISOPRODOL 350 MG TABLET: 350 | 12 days supply | Qty: 50 | Fill #4

## 2017-01-16 ENCOUNTER — Ambulatory Visit (INDEPENDENT_AMBULATORY_CARE_PROVIDER_SITE_OTHER): Payer: Medicare Other | Admitting: Neurology

## 2017-01-16 ENCOUNTER — Encounter: Payer: Self-pay | Admitting: Neurology

## 2017-01-16 VITALS — BP 108/74 | HR 130 | Ht 68.5 in | Wt 148.0 lb

## 2017-01-16 DIAGNOSIS — E271 Primary adrenocortical insufficiency: Secondary | ICD-10-CM | POA: Diagnosis not present

## 2017-01-16 DIAGNOSIS — C711 Malignant neoplasm of frontal lobe: Secondary | ICD-10-CM | POA: Diagnosis not present

## 2017-01-16 DIAGNOSIS — F419 Anxiety disorder, unspecified: Secondary | ICD-10-CM | POA: Diagnosis not present

## 2017-01-16 DIAGNOSIS — G40109 Localization-related (focal) (partial) symptomatic epilepsy and epileptic syndromes with simple partial seizures, not intractable, without status epilepticus: Secondary | ICD-10-CM

## 2017-01-16 NOTE — Progress Notes (Signed)
NEUROLOGY FOLLOW UP OFFICE NOTE  [facility] [id number]  HISTORY OF PRESENT ILLNESS: I had the pleasure of seeing [facility] in follow-up in the neurology clinic on 01/16/2017.  The patient was last seen almost a year ago for seizures. She has a history of a right frontal oligodendroglioma s/p subtotal resection. Since her last visit, she has had 2 brain scans on 11/17/16 and 12/16/16 with stable resection cavity, stable focal mass effect within the right middle frontal gyrus at the superior margin of the cavity which may represent nonenhancing neoplasm, no evidence of tumor progression. She has new issues now concerning low cortisol levels. She reports that she was feeling unwell and requested for a cortisol level to be done, which was very low. She saw Dr. 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 She is having significant issues with this, especially with orthostatic hypotension, which was documented in today's visit. She has other symptoms as well, she feels bloated today. Sometimes it feels as if someone is sitting on her chest. She is having urinary frequency, sometimes has had incontinence. She is "peeing all the time." Even if she does not eat, she would have watery diarrhea. She has seen GI and reports having several tests done. She continues to report seizures, she feels she definitely had 2 last week. She lost consciousness and could not remember anything on Tuesday and Friday. Her speech was off really bad, "embarassingly bad." She was in the ER last 12/15/16 after she could not get up after planting in the garden, reporting left-sided weakness. MRI brain no acute changes. She was diagnosed with a complicated migraine, headache improved after migraine cocktail. She does not want to change her seizure medications because she is already tired all the time. She took a stimulant briefly  which helped her focus, but did not help with the fatigue. She continues to have frequent headaches, sometimes noticing drooping below her right eye and around the left side of her mouth. 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 She was admitted to [facility] last 09/19/2013 due to left vision changes and left facial heaviness. She reports that she had a 60-lb unintentional weight loss 4 months leading up to her admission, she was vomiting after every meal. She started having episodes where her hands would have low amplitude tremors and she feels the right lower face twitching. She would need to pause and take a minute to get words out. If she did not stop what she was doing, the symptoms could last for hours. She would feel like her body was "going 90 mph" with palpitations up to 200 bpm. She was started on Xanax for anxiety, but this did not help. She then started having vision changes on her left eye, she would park crookedly when she thought she was going straight. She saw an ophthalmologist, eye exam was normal but left optic neuritis was questioned and brain imaging recommended. She then started to have left facial heaviness and numbness on the left face, arm, and leg, and drove herself to [facility] where she was found to have a right frontal lesion. She underwent  subtotal resection on 01/23/14 by Dr. Christella Noa and was started on Keppra for seizure prophylaxis at that time. She continued to have the recurrent episodes of tremors in both arms, palpitations, and right facial tightness, as well as episodes of left face, neck, shoulder, and arm numbness, going down the left thumb and index finger. She describes her major seizures  as "all of that times 100." After the episodes, she would be tired for a few days, wanting to sleep, with no energy. She has noticed that her short term memory is "gone." Keppra dose had been increased up to 2 grams daily, and this had caused a change in personality. She had seen neurologist Dr. Leonie Man, who switched her to Depakote. She tapered off Keppra and is currently on Depakote ER 532m BID with significant improvement in symptoms. She denies any side effects to the Depakote.   She has a history of recurrent headaches since her teenage years. Headaches are usually over the right temporal and occipital region, with sharp stabbing and throbbing pain, with a knot in her left neck region. There is occasional nausea, photo, and phonophobia. She was having these headaches around 1-2 times a week when younger, however since December 2014, headaches have been occurring around 2-3 times a week. She had been receiving injections in her neck for this. If she takes medication at the onset, they last a few hours, however they may last up to several days. She has found that taking Decadron for a couple of days has been the only helpful medication. She takes Fioricet rarely. Zomig, aspirin, and Tylenol were ineffective in the past. Her father and 167year old son have headaches. She denies any diplopia, dysarthria, dysphagia, bowel/bladder dysfunction. She has chronic back pain and had been taking Soma qid for several years, with worsening of pain when she tried reducing this. She was started on Gabapentin for the seizures and back pain, she is unsure if this helps. She had been taking Ultram for the pain, and once stopping this, she noticed an improvement in the seizures as well.   Diagnostic Data: [facility] which is likely from breach artifact from prior craniotomy. There is intermittent right frontal sharp activity but no definite  epileptiform activity. Impression: Mild right hemispheric focal cortical irritability but no definitive epileptiform activity is noted.  Her 48-hour EEG did not show any epileptiform discharges. It showed breach artifact consistent with prior surgery. She reported numerous symptoms including headache, severe muscle spasm under the base of her skull on the right side, tremors in both hands, jerking and pause in jaw (right side), left leg/inner thigh and left foot warm and pigmented, eyes twitching, fatigue, with no associated epileptiform correlate seen. [facility] Depakote and Gabapentin were held. Typical events were not clearly captured, however she reported 2 spells where she reported dizziness and visual changes (tiles on floor looked uneven) with no EEG correlate. Baseline EEG showed breach rhythm over the right hemisphere, no epileptiform discharges or electrographic seizures.  I personally reviewed MRI brain with and without contrast done 03/13/14 which showed post-op right-sided craniotomy. The surgical cavity in the right frontal lobe is filled with fluid, with note of improvement in layering blood products since prior study. The study of 01/24/2014 showed restricted diffusion deep to the tumor. This is consistent with acute infarct which is resolving at this time. No acute  infarct is present on today's study. No evidence of recurrent or residual tumor although the original tumor did not enhance. No mass effect or edema is present that would suggest recurrent tumor. Small amount of subarachnoid hemorrhage on the right again noted. MRI 11/2015 which I personally reviewed, there is similar pattern of enhancement and abnormal FLAIR signal in the right frontal gyrus immediately superior to the resection, stable from previous scan.   Epilepsy Risk Factors: Right frontal oligodendroglioma s/p subtotal resection. Otherwise she had a normal  birth and early development. There is no history of febrile convulsions, CNS infections such as meningitis/encephalitis, or family history of seizures.  PAST MEDICAL HISTORY: Past Medical History:  Diagnosis Date  . Anemia   . Anxiety   . Asthma    very mild- no inhaler use since July 2013  . Brain tumor ([place])    Hx: of  . Cancer ([place]) 2003   skin  . Chronic fatigue 10/03/2014  . Cognitive impairment 08/13/2015  . Complication of anesthesia    takes longer for patient to go under anesthesia  . Dizziness   . Dysphasia 02/12/2014  . Glioma, malignant ([place]) dx'd 09/2013  . Headache 02/12/2014  . Headache(784.0)    migraines  . Herniated disc   . Hot flashes 10/03/2014  . Infection    to right head incision  . 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 Liver hemangioma   . Liver lesion 08/22/2014  . Nausea alone 02/12/2014  . Neuropathic pain 04/08/2014  . Petit mal seizure status ([place])   . Seizures ([place])    petit mal serizures most days    MEDICATIONS:  Outpatient Encounter Prescriptions as of 01/16/2017  Medication Sig  . ALPRAZolam (XANAX) 1 MG tablet TAKE 1.5 TABLETS AT BEDTIME  . butalbital-acetaminophen-caffeine (FIORICET, ESGIC) 50-325-40 MG tablet TAKE 1 TABLET BY MOUTH EVERY 6 HOURS AS NEEDED FOR HEADACHE  . carisoprodol (SOMA) 350 MG tablet TAKE 1 TABLET BY MOUTH FOUR TIMES DAILY FOR SEIZURE PREVENTION  . divalproex (DEPAKOTE ER) 250 MG 24 hr tablet Take 2 tablets twice a day  . dronabinol (MARINOL) 10 MG capsule Take 1 capsule (10 mg total) by mouth 2 (two) times daily.  Marland Kitchen gabapentin (NEURONTIN) 300 MG capsule TAKE 3 CAPSULES BY MOUTH IN THE MORNING AND 3 CAPSULES IN THE EVENING  . HYDROcodone-acetaminophen (NORCO) 10-325 MG tablet Take 1 tablet by mouth every 4 (four) hours as needed.  . hydrocortisone (CORTEF) 10 MG tablet 1 TABLET BETWEEN 6AM-8AM AND 1/2 TAB 2PM-4PM DAILY DOUBLING DOSE ON SICK DAYS  . levocetirizine (XYZAL) 5 MG tablet Take 1 tablet (5 mg total) by mouth every  evening.  Marland Kitchen LORazepam (ATIVAN) 1 MG tablet Take 1 tablet (1 mg total) by mouth every 8 (eight) hours as needed for anxiety or seizure.  . montelukast (SINGULAIR) 10 MG tablet TAKE 1 TABLET BY MOUTH AT BEDTIME  . prochlorperazine (COMPAZINE) 10 MG tablet Take 1 tablet (10 mg total) by mouth every 6 (six) hours as needed for nausea or vomiting.  . sucralfate (CARAFATE) 1 g tablet   . lidocaine-prilocaine (EMLA) cream APPLY TO AFFECTED AREA(S) AS NEEDED (Patient not taking: Reported on 01/16/2017)  . ondansetron (ZOFRAN ODT) 8 MG disintegrating tablet Take 1 tablet (8 mg total) by mouth every 8 (eight) hours as needed for nausea or vomiting. (Patient not taking: Reported on 01/16/2017)   No facility-administered encounter medications on file as of 01/16/2017.      ALLERGIES: Allergies  Allergen Reactions  . Adhesive [  Tape] Other (See Comments)    Skin tear  . Latex Hives  . Percocet [Oxycodone-Acetaminophen] Other (See Comments)    makes pt angry  . Shellfish Allergy Diarrhea and Nausea And Vomiting  . Skin Adhesives     Per patient states reaction to dermabond when had hysterectomy. Stated was red and irritated.   Manuela Neptune [Carisoprodol]     Watson brand, specifically, causes disorientation (can take other brands)  . Tramadol Hcl Other (See Comments)    Potentially worsen seizure control  . Vancomycin Hives    FAMILY HISTORY: Family History  Problem Relation Age of Onset  . Hypertension Mother   . Skin cancer Mother   . Thyroid cancer Father   . Skin cancer Father   . Breast cancer Maternal Aunt        dx in her 39s  . Prostate cancer Maternal Uncle 63  . Melanoma Maternal Uncle   . Ovarian cancer Maternal Grandmother        dx in her late 26s-40s  . Breast cancer Maternal Aunt        bilateral breast cancer dx in 77s and 87s; possibly BRCA+  . Other Son        multiple CAL spots, bright spots on brain MRI, ? chiari malformation    SOCIAL HISTORY: Social History   Social  History  . Marital status: Single    Spouse name: N/A  . Number of children: 2  . Years of education: College   Occupational History  . [employer]   Social History Main Topics  . Smoking status: Never Smoker  . Smokeless tobacco: Never Used  . Alcohol use No  . Drug use: No  . Sexual activity: Yes    Birth control/ protection: None   Other Topics Concern  . Not on file   Social History Narrative   Patient lives at home with her two children.   Caffeine use: minimum of 4 cups daily    REVIEW OF SYSTEMS: Constitutional: No fevers, chills, or sweats, + generalized fatigue, change in appetite Eyes: No visual changes, double vision, eye pain Ear, nose and throat: No hearing loss, ear pain, nasal congestion, sore throat Cardiovascular: No chest pain, palpitations Respiratory:  No shortness of breath at rest or with exertion, wheezes GastrointestinaI: No nausea, vomiting, diarrhea, abdominal pain, fecal incontinence Genitourinary:  No dysuria, urinary retention or frequency Musculoskeletal:  + neck pain, back pain Integumentary: No rash, pruritus, skin lesions Neurological: as above Psychiatric: No depression, insomnia, +anxiety Endocrine: No palpitations, fatigue, diaphoresis, mood swings, change in appetite, change in weight, increased thirst Hematologic/Lymphatic:  No anemia, purpura, petechiae. Allergic/Immunologic: no itchy/runny eyes, nasal congestion, recent allergic reactions, rashes  PHYSICAL EXAM: Vitals:   01/16/17 1510  BP: 108/74  Pulse: (!) 130   Orthostatic VS for the past 24 hrs:  BP- Lying Pulse- Lying BP- Sitting Pulse- Sitting BP- Standing at 0 minutes Pulse- Standing at 0 minutes  01/16/17 1510 124/74 86 132/82 121 98/64 138   General: No acute distress, anxious Head:  Normocephalic/atraumatic Neck: supple, no paraspinal tenderness, full range of motion Heart:  Regular rate and rhythm Lungs:  Clear to auscultation  bilaterally Back: No paraspinal tenderness Skin/Extremities: No rash, no edema Neurological Exam: alert and oriented to person, place, and time. No aphasia or dysarthria. Fund of knowledge is appropriate.  Recent and remote memory are intact.  Attention and concentration are normal.   Cranial nerves: Pupils equal, round. No facial  asymmetry. Tongue, uvula, palate midline.  Motor: moves all extremities symmetrically. Gait narrow-based and steady.  IMPRESSION: This is a [name] with a history of grade 2 oligodengroglioma s/p subtotal resection on chemotherapy, anxiety, headaches, with recurrent episodes of low amplitude tremors, palpitations, and right facial tightening, as well as left face and arm numbness. She had a 48-hour EEG which did not show any epileptiform discharges. Her 3-day video EEG monitoring interictally showed breach artifact, otherwise no epileptiform discharges. She did not have typical events and hence these could not be commented on. She continues to report seizures but does not want to change her medications, she is currently on Depakote ER 57m BID and Gabapentin 9071mBID. She is concerned increasing dose would cause more fatigue. Since her last visit, she has been diagnosed with secondary Addison's disease, with very low cortisol levels thought to be due to pituitary dysfunction. MRI pituitary with and without contrast will be ordered to assess for underlying structural abnormality. She is noted to have a more than more than 30-pt drop in BP from sitting to standing, with increase in heart rate, likely due to adrenal insufficiency. She has a follow-up with Endo and Cardiology. We discussed anxiety noted on Neuropsych testing, she is agreeable to seeing a Psychiatrist. She will follow-up in 4-5 months. We again discussed [place] driving laws to stop driving after an episode of loss of consciousness/awareness until 6 months event-free.   Thank you for allowing me to participate in  her care.  Please do not hesitate to call for any questions or concerns.  The duration of this appointment visit was 25 minutes of face-to-face time with the patient.  Greater than 50% of this time was spent in counseling, explanation of diagnosis, planning of further management, and coordination of care.   KaEllouise NewerM.D.   CC: Dr. KeBuddy DutyDr. GoAlvy Bimler

## 2017-01-16 NOTE — Patient Instructions (Addendum)
1. Schedule MRI pituitary with and without contrast  We have sent a referral to [facility] for your MRI and they will call you directly to schedule your appt. They are located at [place]. If you need to contact them directly please call [phone].  2. Refer to Psychiatry for anxiety 3. Continue follow-up with Endo and Cardiology as planned 4. Continue all your current medications 5. Look into wearing an abdominal binder 6. As per [place] driving laws, no driving after an episode of loss of consciousness, until 6 months event-free 7. Follow-up in 4-5 months, call for any changes  Seizure Precautions: 1. If medication has been prescribed for you to prevent seizures, take it exactly as directed.  Do not stop taking the medicine without talking to your doctor first, even if you have not had a seizure in a long time.   2. Avoid activities in which a seizure would cause danger to yourself or to others.  Don't operate dangerous machinery, swim alone, or climb in high or dangerous places, such as on ladders, roofs, or girders.  Do not drive unless your doctor says you may.  3. If you have any warning that you may have a seizure, lay down in a safe place where you can't hurt yourself.    4.  No driving for 6 months from last seizure, as per [facility].   Please refer to the following link on the [place] website for more information: http://www.epilepsyfoundation.org/answerplace/Social/driving/drivingu.cfm   5.  Maintain good sleep hygiene. Avoid alcohol  6.  Notify your neurology if you are planning pregnancy or if you become pregnant.  7.  Contact your doctor if you have any problems that may be related to the medicine you are taking.  8.  Call 911 and bring the patient back to the ED if:        A.  The seizure lasts longer than 5 minutes.       B.  The patient doesn't awaken shortly after the seizure  C.  The patient has new problems such  as difficulty seeing, speaking or moving  D.  The patient was injured during the seizure  E.  The patient has a temperature over 102 F (39C)  F.  The patient vomited and now is having trouble breathing

## 2017-01-17 ENCOUNTER — Other Ambulatory Visit: Payer: Self-pay | Admitting: *Deleted

## 2017-01-17 MED ORDER — HYDROCODONE-ACETAMINOPHEN 10-325 MG PO TABS: 1.0000 | ORAL_TABLET | ORAL | 0 refills | Status: DC | PRN

## 2017-01-17 MED ORDER — LORAZEPAM 1 MG PO TABS: 1.0000 mg | ORAL_TABLET | Freq: Three times a day (TID) | ORAL | 0 refills | Status: DC | PRN

## 2017-01-17 MED ORDER — BUTALBITAL-APAP-CAFFEINE 50-325-40 MG PO TABS: ORAL_TABLET | ORAL | 0 refills | Status: DC

## 2017-01-17 MED FILL — LORazepam 1 MG TABS: 1 | 30 days supply | Qty: 90 | Fill #0

## 2017-01-17 MED FILL — BUTALB-ACETAMIN-CAFF 50-325: 50-325-40 | 22 days supply | Qty: 90 | Fill #0

## 2017-01-17 MED FILL — DRONABINOL 10 MG CAP: 10 | 30 days supply | Qty: 60 | Fill #2

## 2017-01-17 NOTE — Telephone Encounter (Signed)
Pt called for refills of ativan, fiorcet and hydrocodone

## 2017-01-20 MED FILL — HYDROCODON-APAP 10-325: 10-325 | 15 days supply | Qty: 90 | Fill #0

## 2017-01-21 ENCOUNTER — Telehealth: Payer: Self-pay | Admitting: *Deleted

## 2017-01-21 NOTE — Telephone Encounter (Signed)
-----   Message from Heath Lark, MD sent at 01/20/2017  4:14 PM EDT ----- We do not have a psychologist here We can refer her to see a psychiatrist if she would like ----- Message ----- From: Patton Salles, RN Sent: 01/17/2017  11:09 AM To: Heath Lark, MD  [facility] states you have talked with her in the past about seeing someone here at [facility] to help her deal with stress. She is now wanting to see someone. Will be out of town this week.

## 2017-01-21 NOTE — Telephone Encounter (Signed)
LM with note below 

## 2017-01-24 ENCOUNTER — Other Ambulatory Visit (HOSPITAL_BASED_OUTPATIENT_CLINIC_OR_DEPARTMENT_OTHER): Payer: Medicare Other

## 2017-01-24 ENCOUNTER — Ambulatory Visit (HOSPITAL_BASED_OUTPATIENT_CLINIC_OR_DEPARTMENT_OTHER): Payer: Medicare Other

## 2017-01-24 ENCOUNTER — Ambulatory Visit: Payer: Medicare Other

## 2017-01-24 ENCOUNTER — Ambulatory Visit (HOSPITAL_BASED_OUTPATIENT_CLINIC_OR_DEPARTMENT_OTHER): Payer: Medicare Other | Admitting: Hematology and Oncology

## 2017-01-24 VITALS — BP 127/79 | HR 113

## 2017-01-24 VITALS — BP 123/73 | HR 80 | Temp 98.0°F | Resp 20

## 2017-01-24 DIAGNOSIS — C711 Malignant neoplasm of frontal lobe: Secondary | ICD-10-CM

## 2017-01-24 DIAGNOSIS — D702 Other drug-induced agranulocytosis: Secondary | ICD-10-CM

## 2017-01-24 DIAGNOSIS — R519 Headache, unspecified: Secondary | ICD-10-CM

## 2017-01-24 DIAGNOSIS — G44021 Chronic cluster headache, intractable: Secondary | ICD-10-CM | POA: Diagnosis not present

## 2017-01-24 DIAGNOSIS — R110 Nausea: Secondary | ICD-10-CM

## 2017-01-24 DIAGNOSIS — R42 Dizziness and giddiness: Secondary | ICD-10-CM | POA: Insufficient documentation

## 2017-01-24 DIAGNOSIS — R0789 Other chest pain: Secondary | ICD-10-CM

## 2017-01-24 DIAGNOSIS — R4702 Dysphasia: Secondary | ICD-10-CM

## 2017-01-24 DIAGNOSIS — R51 Headache: Secondary | ICD-10-CM

## 2017-01-24 DIAGNOSIS — E2740 Unspecified adrenocortical insufficiency: Secondary | ICD-10-CM

## 2017-01-24 DIAGNOSIS — R202 Paresthesia of skin: Secondary | ICD-10-CM

## 2017-01-24 DIAGNOSIS — R7989 Other specified abnormal findings of blood chemistry: Secondary | ICD-10-CM

## 2017-01-24 LAB — CBC WITH DIFFERENTIAL/PLATELET
BASO%: 0.9 % (ref 0.0–2.0)
Basophils Absolute: 0 10*3/uL (ref 0.0–0.1)
EOS%: 1 % (ref 0.0–7.0)
Eosinophils Absolute: 0 10*3/uL (ref 0.0–0.5)
HCT: 35.7 % (ref 34.8–46.6)
HGB: 12.2 g/dL (ref 11.6–15.9)
LYMPH%: 20.5 % (ref 14.0–49.7)
MCH: 33.6 pg (ref 25.1–34.0)
MCHC: 34 g/dL (ref 31.5–36.0)
MCV: 98.9 fL (ref 79.5–101.0)
MONO#: 0.2 10*3/uL (ref 0.1–0.9)
MONO%: 7.1 % (ref 0.0–14.0)
NEUT#: 2 10*3/uL (ref 1.5–6.5)
NEUT%: 70.5 % (ref 38.4–76.8)
Platelets: 198 10*3/uL (ref 145–400)
RBC: 3.61 10*6/uL — ABNORMAL LOW (ref 3.70–5.45)
RDW: 12.3 % (ref 11.2–14.5)
WBC: 2.8 10*3/uL — ABNORMAL LOW (ref 3.9–10.3)
lymph#: 0.6 10*3/uL — ABNORMAL LOW (ref 0.9–3.3)

## 2017-01-24 LAB — COMPREHENSIVE METABOLIC PANEL
ALT: 10 U/L (ref 0–55)
AST: 12 U/L (ref 5–34)
Albumin: 3.9 g/dL (ref 3.5–5.0)
Alkaline Phosphatase: 40 U/L (ref 40–150)
Anion Gap: 5 mEq/L (ref 3–11)
BUN: 7.9 mg/dL (ref 7.0–26.0)
CO2: 26 mEq/L (ref 22–29)
Calcium: 9.1 mg/dL (ref 8.4–10.4)
Chloride: 107 mEq/L (ref 98–109)
Creatinine: 0.7 mg/dL (ref 0.6–1.1)
EGFR: >90 mL/min/{1.73_m2} (ref >90)
Glucose: 94 mg/dl (ref 70–140)
Potassium: 4.3 mEq/L (ref 3.5–5.1)
Sodium: 138 mEq/L (ref 136–145)
Total Bilirubin: 0.42 mg/dL (ref 0.20–1.20)
Total Protein: 6.7 g/dL (ref 6.4–8.3)

## 2017-01-24 MED ORDER — SODIUM CHLORIDE 0.9 % IJ SOLN
10.0000 mL | INTRAMUSCULAR | Status: DC | PRN
  Administered 2017-01-24: 10 mL via INTRAVENOUS
  Filled 2017-01-24: qty 10

## 2017-01-24 MED ORDER — ONDANSETRON HCL 4 MG/2ML IJ SOLN: 8.0000 mg | Freq: Once | INTRAMUSCULAR | Status: DC

## 2017-01-24 MED ORDER — SODIUM CHLORIDE 0.9 % IV SOLN: Freq: Once | INTRAVENOUS | Status: DC

## 2017-01-24 MED ORDER — HEPARIN SOD (PORK) LOCK FLUSH 100 UNIT/ML IV SOLN
500.0000 [IU] | Freq: Once | INTRAVENOUS | Status: AC | PRN
  Administered 2017-01-24: 500 [IU] via INTRAVENOUS
  Filled 2017-01-24: qty 5

## 2017-01-24 MED ORDER — DEXAMETHASONE SODIUM PHOSPHATE 10 MG/ML IJ SOLN: 10.0000 mg | Freq: Once | INTRAMUSCULAR | Status: DC

## 2017-01-24 MED ORDER — CARISOPRODOL 350 MG PO TABS: ORAL_TABLET | ORAL | 3 refills | Status: DC

## 2017-01-24 MED ORDER — SODIUM CHLORIDE 0.9 % IV SOLN
INTRAVENOUS | Status: AC
  Administered 2017-01-24: 15:00:00 via INTRAVENOUS

## 2017-01-24 NOTE — Patient Instructions (Signed)
Dehydration, Adult Dehydration is when there is not enough fluid or water in your body. This happens when you lose more fluids than you take in. Dehydration can range from mild to very bad. It should be treated right away to keep it from getting very bad. Symptoms of mild dehydration may include:  Thirst.  Dry lips.  Slightly dry mouth.  Dry, warm skin.  Dizziness. Symptoms of moderate dehydration may include:  Very dry mouth.  Muscle cramps.  Dark pee (urine). Pee may be the color of tea.  Your body making less pee.  Your eyes making fewer tears.  Heartbeat that is uneven or faster than normal (palpitations).  Headache.  Light-headedness, especially when you stand up from sitting.  Fainting (syncope). Symptoms of very bad dehydration may include:  Changes in skin, such as: ? Cold and clammy skin. ? Blotchy (mottled) or pale skin. ? Skin that does not quickly return to normal after being lightly pinched and let go (poor skin turgor).  Changes in body fluids, such as: ? Feeling very thirsty. ? Your eyes making fewer tears. ? Not sweating when body temperature is high, such as in hot weather. ? Your body making very little pee.  Changes in vital signs, such as: ? Weak pulse. ? Pulse that is more than 100 beats a minute when you are sitting still. ? Fast breathing. ? Low blood pressure.  Other changes, such as: ? Sunken eyes. ? Cold hands and feet. ? Confusion. ? Lack of energy (lethargy). ? Trouble waking up from sleep. ? Short-term weight loss. ? Unconsciousness. Follow these instructions at home:  If told by your doctor, drink an ORS: ? Make an ORS by using instructions on the package. ? Start by drinking small amounts, about  cup (120 mL) every 5-10 minutes. ? Slowly drink more until you have had the amount that your doctor said to have.  Drink enough clear fluid to keep your pee clear or pale yellow. If you were told to drink an ORS, finish the ORS  first, then start slowly drinking clear fluids. Drink fluids such as: ? Water. Do not drink only water by itself. Doing that can make the salt (sodium) level in your body get too low (hyponatremia). ? Ice chips. ? Fruit juice that you have added water to (diluted). ? Low-calorie sports drinks.  Avoid: ? Alcohol. ? Drinks that have a lot of sugar. These include high-calorie sports drinks, fruit juice that does not have water added, and soda. ? Caffeine. ? Foods that are greasy or have a lot of fat or sugar.  Take over-the-counter and prescription medicines only as told by your doctor.  Do not take salt tablets. Doing that can make the salt level in your body get too high (hypernatremia).  Eat foods that have minerals (electrolytes). Examples include bananas, oranges, potatoes, tomatoes, and spinach.  Keep all follow-up visits as told by your doctor. This is important. Contact a doctor if:  You have belly (abdominal) pain that: ? Gets worse. ? Stays in one area (localizes).  You have a rash.  You have a stiff neck.  You get angry or annoyed more easily than normal (irritability).  You are more sleepy than normal.  You have a harder time waking up than normal.  You feel: ? Weak. ? Dizzy. ? Very thirsty.  You have peed (urinated) only a small amount of very dark pee during 6-8 hours. Get help right away if:  You have symptoms of   very bad dehydration.  You cannot drink fluids without throwing up (vomiting).  Your symptoms get worse with treatment.  You have a fever.  You have a very bad headache.  You are throwing up or having watery poop (diarrhea) and it: ? Gets worse. ? Does not go away.  You have blood or something green (bile) in your throw-up.  You have blood in your poop (stool). This may cause poop to look black and tarry.  You have not peed in 6-8 hours.  You pass out (faint).  Your heart rate when you are sitting still is more than 100 beats a  minute.  You have trouble breathing. This information is not intended to replace advice given to you by your health care provider. Make sure you discuss any questions you have with your health care provider. Document Released: 04/30/2009 Document Revised: 01/22/2016 Document Reviewed: 08/28/2015 Elsevier Interactive Patient Education  2018 Elsevier Inc.  

## 2017-01-25 ENCOUNTER — Encounter: Payer: Self-pay | Admitting: Hematology and Oncology

## 2017-01-25 DIAGNOSIS — E559 Vitamin D deficiency, unspecified: Secondary | ICD-10-CM | POA: Diagnosis not present

## 2017-01-25 DIAGNOSIS — R7989 Other specified abnormal findings of blood chemistry: Secondary | ICD-10-CM | POA: Diagnosis not present

## 2017-01-25 DIAGNOSIS — Z92241 Personal history of systemic steroid therapy: Secondary | ICD-10-CM | POA: Diagnosis not present

## 2017-01-25 DIAGNOSIS — I7300 Raynaud's syndrome without gangrene: Secondary | ICD-10-CM | POA: Diagnosis not present

## 2017-01-25 DIAGNOSIS — R351 Nocturia: Secondary | ICD-10-CM | POA: Diagnosis not present

## 2017-01-25 DIAGNOSIS — L750 Bromhidrosis: Secondary | ICD-10-CM | POA: Diagnosis not present

## 2017-01-25 DIAGNOSIS — D3502 Benign neoplasm of left adrenal gland: Secondary | ICD-10-CM | POA: Diagnosis not present

## 2017-01-25 DIAGNOSIS — R358 Other polyuria: Secondary | ICD-10-CM | POA: Diagnosis not present

## 2017-01-25 DIAGNOSIS — R8290 Unspecified abnormal findings in urine: Secondary | ICD-10-CM | POA: Diagnosis not present

## 2017-01-25 DIAGNOSIS — D649 Anemia, unspecified: Secondary | ICD-10-CM | POA: Diagnosis not present

## 2017-01-25 DIAGNOSIS — F988 Other specified behavioral and emotional disorders with onset usually occurring in childhood and adolescence: Secondary | ICD-10-CM | POA: Diagnosis not present

## 2017-01-25 NOTE — Assessment & Plan Note (Signed)
She was evaluated by endocrinologist  She has been started on steroid therapy I will defer to him for further management

## 2017-01-25 NOTE — Assessment & Plan Note (Signed)
She has severe orthostatic dizziness in my office I recommend urgent cardiology consult She received 1 liter of IVF in my office with improvement of symptoms

## 2017-01-25 NOTE — Assessment & Plan Note (Signed)
She has prolonged pancytopenia related to recent viral illness and from residual side effects from chemotherapy Recommend observation only

## 2017-01-25 NOTE — Progress Notes (Signed)
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      09/27/2013 Pathology Results    Accession: [id number] pathology showed grade 2 oligodendroglioma with mutant IDH1 protein, ATRX, OLIG 2 expression. p53 is negative, low Ki-67 labeling index. 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      11/06/2013 Imaging    Repeat CT scan of the head showed resolution of abscess.      01/23/2014 Surgery    Dr. Christella Noa perform a subtotal gross resection of the tumor.      01/24/2014 Imaging    Repeat imaging studies showed persistent and residual disease.      03/03/2014 - 09/01/2014 Chemotherapy    She was started on Temodar, 5 days every 28 days      03/13/2014 Imaging    Repeat MRI showed no evidence of progression of disease.      07/14/2014 Imaging    Repeat MRI showed no evidence of disease progression.      09/12/2014 Imaging    MRI of the liver show mild regression of the size of lesions, presumed to be benign      09/15/2014 Imaging    Repeat MRI of the brain show persistent abnormalities, suspicious for residual disease      03/02/2015 Imaging     MRI brain at [facility] show persistent abnormalities      08/10/2015 Imaging    Repeat MRI [facility] showed stable disease      08/24/2015 - 02/08/2016 Chemotherapy     She was started on Lomustine, Procarbazine and Vincristine chemotherapy      08/30/2015 Imaging    CT chest showed no evidence of aspiration pneumonia      11/18/2015 Imaging    MRI brain showed status post resection of right frontal lobe tumor without residual or recurrent tumor. 2. Otherwise negative MRI of the brain.       03/02/2016 Imaging    Stable MRI post right frontal lobe tumor resection. Negative for recurrent tumor      06/01/2016 Imaging    Stable MRI post right frontal lobe tumor resection. No recurrent tumor identified      08/19/2016 Imaging    Unchanged examination without tumor recurrence.      11/17/2016 Imaging    Stable right frontal resection cavity. Stable focal mass effect within the right middle frontal gyrus at the superior margin of the cavity which may represent nonenhancing neoplasm. No evidence for tumor progression. Otherwise stable MRI of the brain      12/16/2016 Imaging    MR brain: 1. No acute intracranial abnormality. 2. Unchanged appearance of right frontal lobe resection cavity and surrounding hyperintense T2 weighted signal.       INTERVAL HISTORY: Please see below for problem oriented charting. She had recent endocrinology evaluation and was started on steroids She had recent worsening headaches When her  port is accessed, she developed vasovagal symptoms and profound dizziness She then had severe palpitation in my office She described symptoms of polyuria She denies recent worsening seizures She was given lorazepam in my office and IVF Repeat evaluation showed symptomatic improvement and she was subsequently discharged  REVIEW OF SYSTEMS:   Constitutional: Denies fevers, chills or abnormal weight loss Eyes: Denies blurriness of vision Ears, nose, mouth, throat, and face: Denies mucositis or sore throat Respiratory: Denies cough, dyspnea or wheezes Gastrointestinal:  Denies nausea, heartburn or change in bowel habits Skin: Denies  abnormal skin rashes Lymphatics: Denies new lymphadenopathy or easy bruising Behavioral/Psych: Mood is stable, no new changes  All other systems were reviewed with the patient and are negative.  I have reviewed the past medical history, past surgical history, social history and family history with the patient and they are unchanged from previous note.  ALLERGIES:  is allergic to adhesive [tape]; latex; percocet [oxycodone-acetaminophen]; shellfish allergy; skin adhesives; soma [carisoprodol]; tramadol hcl; and vancomycin.  MEDICATIONS:  Current Outpatient Prescriptions  Medication Sig Dispense Refill  . ALPRAZolam (XANAX) 1 MG tablet TAKE 1.5 TABLETS AT BEDTIME  1  . butalbital-acetaminophen-caffeine (FIORICET, ESGIC) 50-325-40 MG tablet TAKE 1 TABLET BY MOUTH EVERY 6 HOURS AS NEEDED FOR HEADACHE 90 tablet 0  . carisoprodol (SOMA) 350 MG tablet TAKE 1 TABLET BY MOUTH FOUR TIMES DAILY FOR SEIZURE PREVENTION 90 tablet 3  . divalproex (DEPAKOTE ER) 250 MG 24 hr tablet Take 2 tablets twice a day 360 tablet 3  . dronabinol (MARINOL) 10 MG capsule Take 1 capsule (10 mg total) by mouth 2 (two) times daily. 60 capsule 3  . gabapentin (NEURONTIN) 300 MG capsule TAKE 3 CAPSULES BY MOUTH IN THE MORNING AND 3 CAPSULES IN THE EVENING 540 capsule 3  . HYDROcodone-acetaminophen (NORCO) 10-325 MG tablet Take 1 tablet by mouth every 4 (four) hours as needed. 90 tablet 0  . hydrocortisone (CORTEF) 10 MG tablet 1 TABLET BETWEEN 6AM-8AM AND 1/2 TAB 2PM-4PM DAILY DOUBLING DOSE ON SICK DAYS  5  . levocetirizine (XYZAL) 5 MG tablet Take 1 tablet (5 mg total) by mouth every evening. 60 tablet 5  . lidocaine-prilocaine (EMLA) cream APPLY TO AFFECTED AREA(S) AS NEEDED (Patient not taking: Reported on 01/16/2017) 30 g 6  . LORazepam (ATIVAN) 1 MG tablet Take 1 tablet (1 mg total) by mouth every 8 (eight) hours as needed for anxiety or seizure. 90 tablet 0  . montelukast (SINGULAIR) 10 MG tablet TAKE 1 TABLET BY MOUTH AT  BEDTIME 30 tablet 0  . ondansetron (ZOFRAN ODT) 8 MG disintegrating tablet Take 1 tablet (8 mg total) by mouth every 8 (eight) hours as needed for nausea or vomiting. (Patient not taking: Reported on 01/16/2017) 90 tablet 3  . prochlorperazine (COMPAZINE) 10 MG tablet Take 1 tablet (10 mg total) by mouth every 6 (six) hours as needed for nausea or vomiting. 60 tablet 0  . sucralfate (CARAFATE) 1 g tablet   2   No current facility-administered medications for this visit.     PHYSICAL EXAMINATION: ECOG PERFORMANCE STATUS: 2 - Symptomatic, <50% confined to bed  Vitals:   01/24/17 1225  BP: (!) 196/134  Pulse: 88  Resp: 18  Temp: 97.7 F (36.5 C)   Filed Weights   01/24/17 1225  Weight: 146 lb 11.2 oz (66.5 kg)    GENERAL:alert, no distress and comfortable SKIN: skin color, texture, turgor are normal, no rashes or significant lesions EYES: normal, Conjunctiva are pink and non-injected,  sclera clear OROPHARYNX:no exudate, no erythema and lips, buccal mucosa, and tongue normal  NECK: supple, thyroid normal size, non-tender, without nodularity LYMPH:  no palpable lymphadenopathy in the cervical, axillary or inguinal LUNGS: clear to auscultation and percussion with normal breathing effort HEART: regular rate & rhythm with occasional tachycardia and no lower extremity edema ABDOMEN:abdomen soft, non-tender and normal bowel sounds Musculoskeletal:no cyanosis of digits and no clubbing  NEURO: alert & oriented x 3 with fluent speech, no focal motor/sensory deficits  LABORATORY DATA:  I have reviewed the data as listed    Component Value Date/Time   NA 138 01/24/2017 1142   K 4.3 01/24/2017 1142   CL 103 12/16/2016 0348   CO2 26 01/24/2017 1142   GLUCOSE 94 01/24/2017 1142   BUN 7.9 01/24/2017 1142   CREATININE 0.7 01/24/2017 1142   CALCIUM 9.1 01/24/2017 1142   PROT 6.7 01/24/2017 1142   ALBUMIN 3.9 01/24/2017 1142   AST 12 01/24/2017 1142   ALT 10 01/24/2017 1142   ALKPHOS 40  01/24/2017 1142   BILITOT 0.42 01/24/2017 1142   GFRNONAA >60 12/15/2016 2314   GFRAA >60 12/15/2016 2314    No results found for: SPEP, UPEP  Lab Results  Component Value Date   WBC 2.8 (L) 01/24/2017   NEUTROABS 2.0 01/24/2017   HGB 12.2 01/24/2017   HCT 35.7 01/24/2017   MCV 98.9 01/24/2017   PLT 198 01/24/2017      Chemistry      Component Value Date/Time   NA 138 01/24/2017 1142   K 4.3 01/24/2017 1142   CL 103 12/16/2016 0348   CO2 26 01/24/2017 1142   BUN 7.9 01/24/2017 1142   CREATININE 0.7 01/24/2017 1142      Component Value Date/Time   CALCIUM 9.1 01/24/2017 1142   ALKPHOS 40 01/24/2017 1142   AST 12 01/24/2017 1142   ALT 10 01/24/2017 1142   BILITOT 0.42 01/24/2017 1142       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 I recommend observation with surveillance imaging every 3 months. I will bring her in every 4-6 weeks for port flushes and blood draw  Orthostatic dizziness She has severe orthostatic dizziness in my office I recommend urgent cardiology consult She received 1 liter of IVF in my office with improvement of symptoms  Drug-induced neutropenia ([place]) She has prolonged pancytopenia related to recent viral illness and from residual side effects from chemotherapy Recommend observation only  Low serum cortisol level She was evaluated by endocrinologist  She has been started on steroid therapy I will defer to him for further management  Intractable chronic cluster headache The patient takes Fioricet on a regular basis along with pain medications She has worsening headaches recently due to steroids and wide fluctuation of BP She will take hydrocodone as needed   Orders Placed This Encounter  Procedures  . Ambulatory referral to Cardiology    Referral Priority:   Urgent    Referral Type:   Consultation     Referral Reason:   Specialty Services Required    Requested Specialty:   Cardiology    Number of Visits Requested:   1   All questions were answered. The patient knows to call the clinic with any problems, questions or concerns. No barriers to learning was detected. I spent 40 minutes counseling the patient face to face. The total  time spent in the appointment was 55 minutes and more than 50% was on counseling and review of test results     Heath Lark, MD 01/25/2017 7:27 AM

## 2017-01-25 NOTE — Assessment & Plan Note (Signed)
The MRIs show stable disease with no changes. I recommend observation with surveillance imaging every 3 months. I will bring her in every 4-6 weeks for port flushes and blood draw

## 2017-01-25 NOTE — Assessment & Plan Note (Signed)
The patient takes Fioricet on a regular basis along with pain medications She has worsening headaches recently due to steroids and wide fluctuation of BP She will take hydrocodone as needed

## 2017-01-27 ENCOUNTER — Other Ambulatory Visit: Payer: Self-pay | Admitting: Hematology and Oncology

## 2017-01-27 ENCOUNTER — Telehealth: Payer: Self-pay | Admitting: Neurology

## 2017-01-27 DIAGNOSIS — R202 Paresthesia of skin: Secondary | ICD-10-CM

## 2017-01-27 DIAGNOSIS — R4702 Dysphasia: Secondary | ICD-10-CM

## 2017-01-27 DIAGNOSIS — R51 Headache: Secondary | ICD-10-CM

## 2017-01-27 DIAGNOSIS — R0789 Other chest pain: Secondary | ICD-10-CM

## 2017-01-27 DIAGNOSIS — C711 Malignant neoplasm of frontal lobe: Secondary | ICD-10-CM

## 2017-01-27 DIAGNOSIS — R110 Nausea: Secondary | ICD-10-CM

## 2017-01-27 DIAGNOSIS — R519 Headache, unspecified: Secondary | ICD-10-CM

## 2017-01-27 MED FILL — CARISOPRODOL 350 MG TABLET: 350 | 22 days supply | Qty: 90 | Fill #0

## 2017-01-27 MED FILL — GABAPENTIN 300 MG CAPSULE: 300 | 90 days supply | Qty: 540 | Fill #3

## 2017-01-27 NOTE — Telephone Encounter (Signed)
Caller: Victoria Bates   Urgent? No   Reason for the call: She will be seeing her Endocrinologist on 01/31/17. They will be doing a Water Deprivation test on her. She said they are checking her for Diabetes Insipidus. She said that Dr. Delice Lesch had ordered a Pituitary scan, but [name] wanted to know could the Hypothalamus be added to that? She also, asked about [facility] for Imaging instead of  Imaging? She also mentioned Bleeding during a surgery in 2015. She said a major artery was hit. She wanted to know could that have affected the blood supply. She said she has read that damage or a Tumor could cause this.  She wanted to know could Dr. Delice Lesch look back at the MRI reports? Please call. Thanks

## 2017-01-30 DIAGNOSIS — R109 Unspecified abdominal pain: Secondary | ICD-10-CM | POA: Diagnosis not present

## 2017-01-31 ENCOUNTER — Encounter: Payer: Self-pay | Admitting: Interventional Cardiology

## 2017-01-31 ENCOUNTER — Ambulatory Visit (INDEPENDENT_AMBULATORY_CARE_PROVIDER_SITE_OTHER): Payer: Medicare Other | Admitting: Interventional Cardiology

## 2017-01-31 VITALS — BP 150/80 | HR 96 | Ht 69.0 in | Wt 145.1 lb

## 2017-01-31 DIAGNOSIS — R42 Dizziness and giddiness: Secondary | ICD-10-CM

## 2017-01-31 DIAGNOSIS — R079 Chest pain, unspecified: Secondary | ICD-10-CM | POA: Diagnosis not present

## 2017-01-31 DIAGNOSIS — R002 Palpitations: Secondary | ICD-10-CM

## 2017-01-31 MED FILL — LEVOCETIRIZINE 5 MG TABLET: 5 | 30 days supply | Qty: 30 | Fill #7

## 2017-01-31 NOTE — Progress Notes (Signed)
Cardiology Office Note   Date:  01/31/2017   ID:  Victoria Bates, DOB 11/17/1980, MRN [id number]  PCP:  Heath Lark, MD    Chief Complaint  Patient presents with  . New Patient (Initial Visit)    orthostatic dizziness/Dr.Gorsuch     Wt Readings from Last 3 Encounters:  01/31/17 145 lb 1.9 oz (65.8 kg)  01/24/17 146 lb 11.2 oz (66.5 kg)  01/16/17 148 lb (67.1 kg)       History of Present Illness: Victoria Bates is a 36 y.o. female  Who had a brain malignancy treated with chemotherapy and surgery.    She has had issues with orthostatic hypotension.  She has severe dizziness with standing.    She has had issues with low cortisol and seizures as well.  IShe states it is not primary Addisons, but a secondary Addisons.  She will have a different scan of her brain.   She describes chest pressure that is worse in the heat.  Worse with stress as well.    She states that she has had spells of AFib in the past, but I cannot find this documented.    Victoria Bates is a 36 y.o. female who is being seen today for the evaluation of palpitations, chest pain and orthostatic dizziness at the request of Heath Lark, MD.       Past Medical History:  Diagnosis Date  . Anemia   . Anxiety   . Asthma    very mild- no inhaler use since July 2013  . Brain tumor ([place])    Hx: of  . Cancer ([place]) 2003   skin  . Chronic fatigue 10/03/2014  . Cognitive impairment 08/13/2015  . Complication of anesthesia    takes longer for patient to go under anesthesia  . Dizziness   . Dysphasia 02/12/2014  . Glioma, malignant ([place]) dx'd 09/2013  . Headache 02/12/2014  . Headache(784.0)    migraines  . Herniated disc   . Hot flashes 10/03/2014  . Infection    to right head incision  . 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 Liver hemangioma   . Liver lesion 08/22/2014  . Nausea alone 02/12/2014  . Neuropathic pain 04/08/2014  . 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 Seizures ([place])    petit mal serizures  most days    Past Surgical History:  Procedure Laterality Date  . ABDOMINAL HYSTERECTOMY    . CESAREAN SECTION    . COLONOSCOPY WITH PROPOFOL N/A 08/29/2012   Procedure: COLONOSCOPY WITH PROPOFOL;  Surgeon: Arta Silence, MD;  Location: [facility];  Service: Endoscopy;  Laterality: N/A;  . coposcopy  08/06/2012   cervical biopsy  . CRANIOTOMY Right 09/26/2013   Procedure: Awake Right frontal open brain biopsy;  Surgeon: Winfield Cunas, MD;  Location: [facility];  Service: Neurosurgery;  Laterality: Right;  awake Right frontal open brain biopsy  . CRANIOTOMY N/A 10/30/2013   Procedure: debridement of cranial wound  right temporal incision, ;  Surgeon: Winfield Cunas, MD;  Location: [facility];  Service: Neurosurgery;  Laterality: N/A;  . CRANIOTOMY N/A 01/23/2014   Procedure: Awake Craniotomy with BrainLab;  Surgeon: Winfield Cunas, MD;  Location: [facility];  Service: Neurosurgery;  Laterality: N/A;  Awake Craniotomy  tumor with BrainLab  . ESOPHAGOGASTRODUODENOSCOPY (EGD) WITH PROPOFOL N/A 08/29/2012   Procedure: ESOPHAGOGASTRODUODENOSCOPY (EGD) WITH PROPOFOL;  Surgeon: Arta Silence, MD;  Location: [facility];  Service: Endoscopy;  Laterality: N/A;  .  left knee surgery  2000   torn meniscus  . right flank surgery  2005   bnign mass from flank area-right  . ROBOTIC ASSISTED TOTAL HYSTERECTOMY WITH BILATERAL SALPINGO OOPHERECTOMY Bilateral 09/25/2012   Procedure: ROBOTIC ASSISTED TOTAL HYSTERECTOMY WITH BILATERAL SALPINGO OOPHORECTOMY;  Surgeon: Maeola Sarah. Landry Mellow, MD;  Location: [facility];  Service: Gynecology;  Laterality: Bilateral;  . ROTATOR CUFF REPAIR    . WISDOM TOOTH EXTRACTION       Current Outpatient Prescriptions  Medication Sig Dispense Refill  . ALPRAZolam (XANAX) 1 MG tablet Take 1.5 mg by mouth at bedtime.    . butalbital-acetaminophen-caffeine (FIORICET, ESGIC) 50-325-40 MG tablet TAKE 1 TABLET BY MOUTH EVERY 6 HOURS AS NEEDED FOR HEADACHE 90 tablet 0  . carisoprodol (SOMA)  350 MG tablet TAKE 1 TABLET BY MOUTH 4 TIMES DAILY FOR SEIZURE PREVENTION 90 tablet 3  . Cholecalciferol (VITAMIN D) 2000 units CAPS Take 2,000 Units by mouth daily.    . Cyanocobalamin (VITAMIN B-12 PO) Take 2,500 Units by mouth daily.    . divalproex (DEPAKOTE ER) 250 MG 24 hr tablet Take 500 mg by mouth 2 (two) times daily.    Marland Kitchen dronabinol (MARINOL) 10 MG capsule Take 1 capsule (10 mg total) by mouth 2 (two) times daily. 60 capsule 3  . gabapentin (NEURONTIN) 300 MG capsule TAKE 3 CAPSULES BY MOUTH IN THE MORNING AND 3 CAPSULES IN THE EVENING 540 capsule 3  . HYDROcodone-acetaminophen (NORCO) 10-325 MG tablet Take 1 tablet by mouth every 4 (four) hours as needed. 90 tablet 0  . HYDROcodone-acetaminophen (NORCO) 10-325 MG tablet Take 1 tablet by mouth every 4 (four) hours as needed (pain).    . hydrocortisone (CORTEF) 10 MG tablet Take 10 mg by mouth as directed. 1 tablet between 6-8am, 1/2 tablet at 11am, 1/2 tablet between 2-4pm daily, doubling dose on sick days    . levocetirizine (XYZAL) 5 MG tablet Take 1 tablet (5 mg total) by mouth every evening. 60 tablet 5  . LORazepam (ATIVAN) 1 MG tablet Take 1 tablet (1 mg total) by mouth every 8 (eight) hours as needed for anxiety or seizure. 90 tablet 0  . montelukast (SINGULAIR) 10 MG tablet TAKE 1 TABLET BY MOUTH AT BEDTIME 30 tablet 0  . prochlorperazine (COMPAZINE) 10 MG tablet Take 1 tablet (10 mg total) by mouth every 6 (six) hours as needed for nausea or vomiting. 60 tablet 0   No current facility-administered medications for this visit.     Allergies:   Adhesive [tape]; Latex; Percocet [oxycodone-acetaminophen]; Shellfish allergy; Skin adhesives; Soma [carisoprodol]; Tramadol hcl; and Vancomycin    Social History:  The patient  reports that she has never smoked. She has never used smokeless tobacco. She reports that she does not drink alcohol or use drugs.   Family History:  The patient's family history includes Breast cancer in her  maternal aunt and maternal aunt; Hypertension in her mother; Melanoma in her maternal uncle; Other in her son; Ovarian cancer in her maternal grandmother; Prostate cancer (age of onset: 37) in her maternal uncle; Skin cancer in her father and mother; Thyroid cancer in her father.    ROS:  Please see the history of present illness.   Otherwise, review of systems are positive for multiple sx including chest presuure, dizziness with standing and palpitations.  She has seizures and headaches.   All other systems are reviewed and negative.    PHYSICAL EXAM: VS:  BP (!) 150/80   Pulse 96  Ht 5\' 9"  (1.753 m)   Wt 145 lb 1.9 oz (65.8 kg)   LMP 09/20/2012   BMI 21.43 kg/m  , BMI Body mass index is 21.43 kg/m. GEN: Well nourished, well developed, in no acute distress , anxious, tearful at times HEENT: normal  Neck: no JVD, carotid bruits, or masses Cardiac: RRR; no murmurs, rubs, or gallops,no edema  Respiratory:  clear to auscultation bilaterally, normal work of breathing GI: soft, nontender, nondistended, + BS MS: no deformity or atrophy  Skin: warm and dry, no rash Neuro:  Strength and sensation are intact Psych: anxious, tearful at times   EKG:   The ekg ordered 6/2 demonstrates normal sinus rhythm, no ST segment changes   Recent Labs: 01/24/2017: ALT 10; BUN 7.9; Creatinine 0.7; HGB 12.2; Platelets 198; Potassium 4.3; Sodium 138   Lipid Panel    Component Value Date/Time   CHOL 166 09/20/2013 0450   TRIG 57 09/20/2013 0450   HDL 77 09/20/2013 0450   CHOLHDL 2.2 09/20/2013 0450   VLDL 11 09/20/2013 0450   LDLCALC 78 09/20/2013 0450     Other studies Reviewed: Additional studies/ records that were reviewed today with results demonstrating: I reviewed her prior ECGs and found no evidence of atrial fibrillation.   ASSESSMENT AND PLAN:  1. Orthostatic dizziness : I reviewed her blood pressure. She does have lower blood pressures with standing. I encouraged her to stay well  hydrated. She can increase her salt intake as well. We'll check echocardiogram to evaluate for any structural heart disease. She is having steroid supplementation as well per endocrinology. This should help her blood pressures. 2. Palpitations: Plan for 48 hour Holter monitor. She has symptoms quite frequently.  I asked her to keep a diary of her symptoms so we could see whether or not she is having anything more than sinus tachycardia when she is symptomatic.  She does appear quite anxious and I wonder if anxiety is playing a part in her perceived palpitations. 3. Chest pressure: I doubt she has any ischemia. We'll check for structural heart disease with echocardiogram. 4. I stressed the importance of staying well hydrated. She also needs to be careful when changing positions.   Current medicines are reviewed at length with the patient today.  The patient concerns regarding her medicines were addressed.  The following changes have been made:  No change  Labs/ tests ordered today include:   Orders Placed This Encounter  Procedures  . Holter monitor - 48 hour  . ECHOCARDIOGRAM COMPLETE    Recommend 150 minutes/week of aerobic exercise Low fat, low carb, high fiber diet recommended  Disposition:   FU for testing   Signed, Larae Grooms, MD  01/31/2017 4:55 PM    [facility] [address], [place], [place]  [address] Phone: [phone]; Fax: [phone]

## 2017-01-31 NOTE — Patient Instructions (Signed)
Medication Instructions:  Your physician recommends that you continue on your current medications as directed. Please refer to the Current Medication list given to you today.   Labwork: None ordered  Testing/Procedures: Your physician has requested that you have an echocardiogram. Echocardiography is a painless test that uses sound waves to create images of your heart. It provides your doctor with information about the size and shape of your heart and how well your heart's chambers and valves are working. This procedure takes approximately one hour. There are no restrictions for this procedure.  Your physician has recommended that you wear a holter monitor. Holter monitors are medical devices that record the heart's electrical activity. Doctors most often use these monitors to diagnose arrhythmias. Arrhythmias are problems with the speed or rhythm of the heartbeat. The monitor is a small, portable device. You can wear one while you do your normal daily activities. This is usually used to diagnose what is causing palpitations/syncope (passing out).    Follow-Up: Based on tet results   Any Other Special Instructions Will Be Listed Below (If Applicable).     If you need a refill on your cardiac medications before your next appointment, please call your pharmacy.

## 2017-02-01 ENCOUNTER — Other Ambulatory Visit: Payer: Self-pay

## 2017-02-01 ENCOUNTER — Ambulatory Visit (HOSPITAL_COMMUNITY): Payer: Medicare Other | Attending: Cardiology

## 2017-02-01 DIAGNOSIS — R079 Chest pain, unspecified: Secondary | ICD-10-CM | POA: Insufficient documentation

## 2017-02-04 ENCOUNTER — Ambulatory Visit
Admission: RE | Admit: 2017-02-04 | Discharge: 2017-02-04 | Disposition: A | Discharge status: Home / Self Care | Payer: Medicare Other | Source: Ambulatory Visit | Attending: Neurology | Admitting: Neurology

## 2017-02-04 DIAGNOSIS — F419 Anxiety disorder, unspecified: Secondary | ICD-10-CM

## 2017-02-04 DIAGNOSIS — E271 Primary adrenocortical insufficiency: Secondary | ICD-10-CM

## 2017-02-04 DIAGNOSIS — C719 Malignant neoplasm of brain, unspecified: Secondary | ICD-10-CM | POA: Diagnosis not present

## 2017-02-04 DIAGNOSIS — G40109 Localization-related (focal) (partial) symptomatic epilepsy and epileptic syndromes with simple partial seizures, not intractable, without status epilepticus: Secondary | ICD-10-CM

## 2017-02-04 DIAGNOSIS — C711 Malignant neoplasm of frontal lobe: Secondary | ICD-10-CM

## 2017-02-04 MED ORDER — GADOBENATE DIMEGLUMINE 529 MG/ML IV SOLN
7.0000 mL | Freq: Once | INTRAVENOUS | Status: AC | PRN
  Administered 2017-02-04: 7 mL via INTRAVENOUS

## 2017-02-06 ENCOUNTER — Telehealth: Payer: Self-pay | Admitting: Neurology

## 2017-02-06 NOTE — Telephone Encounter (Signed)
PT called and said she went to have her MRI and there was a different code that needed to be used or insurance will kick it out, she did have the scan done but please advise

## 2017-02-07 ENCOUNTER — Other Ambulatory Visit: Payer: Self-pay | Admitting: Hematology and Oncology

## 2017-02-07 ENCOUNTER — Telehealth: Payer: Self-pay | Admitting: *Deleted

## 2017-02-07 MED ORDER — LORAZEPAM 1 MG PO TABS: 1.0000 mg | ORAL_TABLET | Freq: Three times a day (TID) | ORAL | 0 refills | Status: DC | PRN

## 2017-02-07 MED ORDER — BUTALBITAL-APAP-CAFFEINE 50-325-40 MG PO TABS: ORAL_TABLET | ORAL | 0 refills | Status: DC

## 2017-02-07 MED ORDER — HYDROCODONE-ACETAMINOPHEN 10-325 MG PO TABS: 1.0000 | ORAL_TABLET | ORAL | 0 refills | Status: DC | PRN

## 2017-02-07 MED FILL — HYDROCODON-APAP 10-325: 10-325 | 15 days supply | Qty: 90 | Fill #0

## 2017-02-07 MED FILL — BUTALB-ACETAMIN-CAFF 50-325: 50-325-40 | 22 days supply | Qty: 90 | Fill #0

## 2017-02-07 NOTE — Telephone Encounter (Signed)
Victoria Bates left a message stating she needs refill of hydrocodone 10-325, fioricet and ativan. Has been taking the ativan on a schedule for seizures plus for breakthrough. Is not sure if she is having seizures or heart issues. Will need increased # of ativan due to the breakthrough usage.

## 2017-02-07 NOTE — Telephone Encounter (Signed)
-----   Message from Cameron Sprang, MD sent at 02/06/2017  3:14 PM EDT ----- Pls let her know the MRI showed normal pituitary and hypothalamus, no evidence of tumor in these regions. There is no change in her brain scan, thanks

## 2017-02-07 NOTE — Telephone Encounter (Signed)
Patient has been given the results but is now wondering if she should have a functional MRI.  Please advise.

## 2017-02-07 NOTE — Telephone Encounter (Signed)
See MRI result note.

## 2017-02-07 NOTE — Telephone Encounter (Signed)
Noted  

## 2017-02-09 ENCOUNTER — Ambulatory Visit (INDEPENDENT_AMBULATORY_CARE_PROVIDER_SITE_OTHER): Payer: Medicare Other

## 2017-02-09 DIAGNOSIS — R002 Palpitations: Secondary | ICD-10-CM

## 2017-02-13 NOTE — Telephone Encounter (Signed)
I don't typically order functional MRIs, usually they are done more in academic places like [facility]. Is she wanting it for the pituitary? If for this, she can see if her endocrinologist thinks it is helpful or ask her physicians at [facility] if this is something they feel would be helpful. Thanks

## 2017-02-14 ENCOUNTER — Telehealth: Payer: Self-pay

## 2017-02-14 MED FILL — LORazepam 1 MG TABS: 1 | 30 days supply | Qty: 90 | Fill #0

## 2017-02-14 NOTE — Telephone Encounter (Signed)
Spoke with pt relaying message below.   

## 2017-02-14 NOTE — Telephone Encounter (Signed)
Received a note from the patient that was found with the 48-hr holter when she returned it on 7/30. The note describes an episode that the patient had on Sunday afternoon 7/29. Patient's note states that:   "I had pressure in my chest, and as I walked to the trash can (standing-standing/not a positional change) a very sharp pain shot up in my neck and my vision in my left eye immediately went foggy/blurry. I was also extremely weak and my speech was/is more slurred than post seizures. I'm sure this was not a seizure (or not my normal ones). My HR sitting ranged from 146-182. When I stood it dropped within seconds to 80s-90s. After about 2-3 minutes it suddenly plummeted to 44-46. It rose to 51 while my O2 dropped from 98-99 to 83. This lasted a few minutes while my husband held me up. When I sat down in the recliner and put my feet up my HR rose to the low 80s. My O2 took a little longer to come back up. It stayed in the 80s for approximately 5-7 minutes. My left eye has still not rebounded back to normal completely and my left leg(calf) is still weak. No chest pain or extreme palpitations."  Monitor results are in the process of being downloaded. Reviewed with DOD. Will wait for monitor results. Attempted to call patient. There was no answer. Left message for patient to call back.

## 2017-02-15 NOTE — Telephone Encounter (Signed)
Returned call to patient. Made her aware that we are still  waiting on the results of her holter monitor. Advised that the patient follow up with her neurologist in the meantime. Patient states that she spoke with them yesterday. I let her know that I would contact her as soon as we had the results. Patient verbalized understanding and thanked me for the call.

## 2017-02-15 NOTE — Telephone Encounter (Signed)
Follow up  ° ° °Patient returning call back to nurse  °

## 2017-02-16 MED FILL — CARISOPRODOL 350 MG TABLET: 350 | 22 days supply | Qty: 90 | Fill #1

## 2017-02-16 MED FILL — DRONABINOL 10 MG CAPS: 10 | 30 days supply | Qty: 60 | Fill #3

## 2017-02-16 NOTE — Telephone Encounter (Signed)
OK.  Thanks.  Would be curious to see what she was doing when she had such high rate sinus tachycardia.  We did not see any significant arrhythmia.

## 2017-02-16 NOTE — Telephone Encounter (Signed)
I left her a message and she has not called back yet.

## 2017-02-16 NOTE — Telephone Encounter (Signed)
Drue Novel I, RN  Jettie Booze, MD Cc: Drue Novel I, RN        Reviewed strip with Dr. Lovena Le and he feels that it is ST also. The patient did not turn in a diary. When I spoke to her the other day she said that she forgot to drop that off because her son has been in the hospital. She said that she was going to drop it off as soon as she could. We have not received anything yet. I called and left a message for her to call me back so that I can try to find out what she was doing at that time.

## 2017-02-16 NOTE — Telephone Encounter (Signed)
Did you see my note about showing the strip to EP?

## 2017-02-16 NOTE — Telephone Encounter (Signed)
Spoke with patient and she states that during the episode at 1:30P she was just standing and her heart started racing, she was SOB, and was having tremors (this is not new for her). I made the patient aware that there were no abnormal heart rhythms that were noted and that having a fast HR is a normal way that the body responds to stress. Advised for the patient to continue to follow up with neuro. Patient verbalized understanding.  Patient states that she is also going to see an endocrinologist. Patient thanked me for the call.

## 2017-02-21 ENCOUNTER — Other Ambulatory Visit: Payer: Self-pay | Admitting: Hematology and Oncology

## 2017-02-21 ENCOUNTER — Telehealth: Payer: Self-pay | Admitting: *Deleted

## 2017-02-21 ENCOUNTER — Other Ambulatory Visit: Payer: Self-pay | Admitting: *Deleted

## 2017-02-21 MED ORDER — HYDROCODONE-ACETAMINOPHEN 10-325 MG PO TABS: 1.0000 | ORAL_TABLET | ORAL | 0 refills | Status: DC | PRN

## 2017-02-21 MED FILL — HYDROCODON-APAP 10-325: 10-325 | 15 days supply | Qty: 90 | Fill #0

## 2017-02-21 NOTE — Telephone Encounter (Signed)
ready

## 2017-02-21 NOTE — Telephone Encounter (Signed)
"  I need a refill for Hydrocodone 10-325 mg.  Call [name] when ready for pick-up today.  I had a bad seizure yesterday so I am not driving."

## 2017-02-27 DIAGNOSIS — L750 Bromhidrosis: Secondary | ICD-10-CM | POA: Diagnosis not present

## 2017-02-27 DIAGNOSIS — R8290 Unspecified abnormal findings in urine: Secondary | ICD-10-CM | POA: Diagnosis not present

## 2017-02-27 DIAGNOSIS — R079 Chest pain, unspecified: Secondary | ICD-10-CM | POA: Diagnosis not present

## 2017-02-27 DIAGNOSIS — Z92241 Personal history of systemic steroid therapy: Secondary | ICD-10-CM | POA: Diagnosis not present

## 2017-02-27 DIAGNOSIS — I7300 Raynaud's syndrome without gangrene: Secondary | ICD-10-CM | POA: Diagnosis not present

## 2017-02-27 DIAGNOSIS — R631 Polydipsia: Secondary | ICD-10-CM | POA: Diagnosis not present

## 2017-02-27 DIAGNOSIS — D3502 Benign neoplasm of left adrenal gland: Secondary | ICD-10-CM | POA: Diagnosis not present

## 2017-02-27 DIAGNOSIS — D649 Anemia, unspecified: Secondary | ICD-10-CM | POA: Diagnosis not present

## 2017-02-27 DIAGNOSIS — R7989 Other specified abnormal findings of blood chemistry: Secondary | ICD-10-CM | POA: Diagnosis not present

## 2017-02-27 DIAGNOSIS — R000 Tachycardia, unspecified: Secondary | ICD-10-CM | POA: Diagnosis not present

## 2017-03-06 ENCOUNTER — Telehealth: Payer: Self-pay | Admitting: Hematology and Oncology

## 2017-03-06 ENCOUNTER — Encounter: Payer: Self-pay | Admitting: Hematology and Oncology

## 2017-03-06 ENCOUNTER — Ambulatory Visit (HOSPITAL_BASED_OUTPATIENT_CLINIC_OR_DEPARTMENT_OTHER): Payer: Medicare Other | Admitting: Hematology and Oncology

## 2017-03-06 ENCOUNTER — Ambulatory Visit (HOSPITAL_BASED_OUTPATIENT_CLINIC_OR_DEPARTMENT_OTHER): Payer: Medicare Other

## 2017-03-06 ENCOUNTER — Other Ambulatory Visit (HOSPITAL_BASED_OUTPATIENT_CLINIC_OR_DEPARTMENT_OTHER): Payer: Medicare Other

## 2017-03-06 VITALS — BP 155/99 | HR 73 | Temp 98.3°F | Resp 18 | Ht 69.0 in | Wt 148.4 lb

## 2017-03-06 DIAGNOSIS — R4189 Other symptoms and signs involving cognitive functions and awareness: Secondary | ICD-10-CM

## 2017-03-06 DIAGNOSIS — R7989 Other specified abnormal findings of blood chemistry: Secondary | ICD-10-CM

## 2017-03-06 DIAGNOSIS — E2740 Unspecified adrenocortical insufficiency: Secondary | ICD-10-CM

## 2017-03-06 DIAGNOSIS — G44021 Chronic cluster headache, intractable: Secondary | ICD-10-CM

## 2017-03-06 DIAGNOSIS — C711 Malignant neoplasm of frontal lobe: Secondary | ICD-10-CM

## 2017-03-06 LAB — CBC WITH DIFFERENTIAL/PLATELET
BASO%: 0.6 % (ref 0.0–2.0)
Basophils Absolute: 0 10*3/uL (ref 0.0–0.1)
EOS%: 1 % (ref 0.0–7.0)
Eosinophils Absolute: 0 10*3/uL (ref 0.0–0.5)
HCT: 33.5 % — ABNORMAL LOW (ref 34.8–46.6)
HGB: 11.4 g/dL — ABNORMAL LOW (ref 11.6–15.9)
LYMPH%: 26.2 % (ref 14.0–49.7)
MCH: 33.7 pg (ref 25.1–34.0)
MCHC: 34 g/dL (ref 31.5–36.0)
MCV: 99.1 fL (ref 79.5–101.0)
MONO#: 0.2 10*3/uL (ref 0.1–0.9)
MONO%: 7.7 % (ref 0.0–14.0)
NEUT#: 2 10*3/uL (ref 1.5–6.5)
NEUT%: 64.5 % (ref 38.4–76.8)
Platelets: 172 10*3/uL (ref 145–400)
RBC: 3.38 10*6/uL — ABNORMAL LOW (ref 3.70–5.45)
RDW: 11.5 % (ref 11.2–14.5)
WBC: 3.1 10*3/uL — ABNORMAL LOW (ref 3.9–10.3)
lymph#: 0.8 10*3/uL — ABNORMAL LOW (ref 0.9–3.3)

## 2017-03-06 LAB — COMPREHENSIVE METABOLIC PANEL
ALT: 12 U/L (ref 0–55)
AST: 12 U/L (ref 5–34)
Albumin: 3.8 g/dL (ref 3.5–5.0)
Alkaline Phosphatase: 39 U/L — ABNORMAL LOW (ref 40–150)
Anion Gap: 5 mEq/L (ref 3–11)
BUN: 5.9 mg/dL — ABNORMAL LOW (ref 7.0–26.0)
CO2: 30 mEq/L — ABNORMAL HIGH (ref 22–29)
Calcium: 9.3 mg/dL (ref 8.4–10.4)
Chloride: 105 mEq/L (ref 98–109)
Creatinine: 0.7 mg/dL (ref 0.6–1.1)
EGFR: >90 mL/min/{1.73_m2} (ref >90)
Glucose: 88 mg/dl (ref 70–140)
Potassium: 4.1 mEq/L (ref 3.5–5.1)
Sodium: 141 mEq/L (ref 136–145)
Total Bilirubin: 0.38 mg/dL (ref 0.20–1.20)
Total Protein: 6.6 g/dL (ref 6.4–8.3)

## 2017-03-06 MED ORDER — SODIUM CHLORIDE 0.9 % IJ SOLN
10.0000 mL | INTRAMUSCULAR | Status: DC | PRN
  Administered 2017-03-06: 10 mL via INTRAVENOUS
  Filled 2017-03-06: qty 10

## 2017-03-06 MED ORDER — HYDROCODONE-ACETAMINOPHEN 10-325 MG PO TABS: 1.0000 | ORAL_TABLET | ORAL | 0 refills | Status: DC | PRN

## 2017-03-06 MED ORDER — BUTALBITAL-APAP-CAFFEINE 50-325-40 MG PO TABS: ORAL_TABLET | ORAL | 0 refills | Status: DC

## 2017-03-06 MED ORDER — HEPARIN SOD (PORK) LOCK FLUSH 100 UNIT/ML IV SOLN
500.0000 [IU] | Freq: Once | INTRAVENOUS | Status: AC | PRN
  Administered 2017-03-06: 500 [IU] via INTRAVENOUS
  Filled 2017-03-06: qty 5

## 2017-03-06 MED FILL — BUTALB-ACETAMIN-CAFF 50-325: 50-325-40 | 23 days supply | Qty: 90 | Fill #0

## 2017-03-06 MED FILL — HYDROCODON-APAP 10-325: 10-325 | 15 days supply | Qty: 90 | Fill #0

## 2017-03-06 NOTE — Telephone Encounter (Signed)
Gave pt avs for appts and did not want calendar.

## 2017-03-06 NOTE — Assessment & Plan Note (Signed)
She has chronic negative impairments and recently has been prescribed Adderall Continue the same

## 2017-03-06 NOTE — Assessment & Plan Note (Signed)
The last MRIs show stable disease with no changes. I recommend observation with surveillance imaging every 3 months, next due in October 2018 I recommend she gets her MRI done at [facility] I will bring her in every 6 weeks for port flushes and blood draw We also discussed about port removal but the patient is not ready

## 2017-03-06 NOTE — Progress Notes (Signed)
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      09/27/2013 Pathology Results    Accession: [id number] pathology showed grade 2 oligodendroglioma with mutant IDH1 protein, ATRX, OLIG 2 expression. p53 is negative, low Ki-67 labeling index. 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      11/06/2013 Imaging    Repeat CT scan of the head showed resolution of abscess.      01/23/2014 Surgery    Dr. Christella Noa perform a subtotal gross resection of the tumor.      01/24/2014 Imaging    Repeat imaging studies showed persistent and residual disease.      03/03/2014 - 09/01/2014 Chemotherapy    She was started on Temodar, 5 days every 28 days      03/13/2014 Imaging    Repeat MRI showed no evidence of progression of disease.      07/14/2014 Imaging    Repeat MRI showed no evidence of disease progression.      09/12/2014 Imaging    MRI of the liver show mild regression of the size of lesions, presumed to be benign      09/15/2014 Imaging    Repeat MRI of the brain show persistent abnormalities, suspicious for residual disease      03/02/2015 Imaging     MRI brain at [place] show persistent abnormalities      08/10/2015 Imaging    Repeat MRI [facility] showed stable disease      08/24/2015 - 02/08/2016 Chemotherapy     She was started on Lomustine, Procarbazine and Vincristine chemotherapy      08/30/2015 Imaging    CT chest showed no evidence of aspiration pneumonia      11/18/2015 Imaging    MRI brain showed status post resection of right frontal lobe tumor without residual or recurrent tumor. 2. Otherwise negative MRI of the brain.       03/02/2016 Imaging    Stable MRI post right frontal lobe tumor resection. Negative for recurrent tumor      06/01/2016 Imaging    Stable MRI post right frontal lobe tumor resection. No recurrent tumor identified      08/19/2016 Imaging    Unchanged examination without tumor recurrence.      11/17/2016 Imaging    Stable right frontal resection cavity. Stable focal mass effect within the right middle frontal gyrus at the superior margin of the cavity which may represent nonenhancing neoplasm. No evidence for tumor progression. Otherwise stable MRI of the brain      12/16/2016 Imaging    MR brain: 1. No acute intracranial abnormality. 2. Unchanged appearance of right frontal lobe resection cavity and surrounding hyperintense T2 weighted signal.      02/04/2017 Imaging    Repeat MRI Normal pituitary  Stable postop changes of right frontal tumor resection       INTERVAL HISTORY: Please  see below for problem oriented charting. 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 She was recommended to have functional MRI done in view of abnormal cortisol level Her port flush seems to have triggered some sensation of palpitation She continued to have recurrent episodes of tachycardia twice a week Overall, she is improved since the last time I saw her Her headaches appears to be stable with Fioricet and hydrocodone prescription  REVIEW OF SYSTEMS:   Constitutional: Denies fevers, chills or abnormal weight loss Eyes: Denies  blurriness of vision Ears, nose, mouth, throat, and face: Denies mucositis or sore throat Respiratory: Denies cough, dyspnea or wheezes Cardiovascular: Denies palpitation, chest discomfort or lower extremity swelling Gastrointestinal:  Denies nausea, heartburn or change in bowel habits Skin: Denies abnormal skin rashes Lymphatics: Denies new lymphadenopathy or easy bruising Neurological:Denies numbness, tingling or new weaknesses Behavioral/Psych: Mood is stable, no new changes  All other systems were reviewed with the patient and are negative.  I have reviewed the past medical history, past surgical history, social history and family history with the patient and they are unchanged from previous note.  ALLERGIES:  is allergic to adhesive [tape]; latex; percocet [oxycodone-acetaminophen]; shellfish allergy; skin adhesives; soma [carisoprodol]; tramadol hcl; and vancomycin.  MEDICATIONS:  Current Outpatient Prescriptions  Medication Sig Dispense Refill  . ALPRAZolam (XANAX) 1 MG tablet Take 1.5 mg by mouth at bedtime.    . butalbital-acetaminophen-caffeine (FIORICET, ESGIC) 50-325-40 MG tablet TAKE 1 TABLET BY MOUTH EVERY 6 HOURS AS NEEDED FOR HEADACHE 90 tablet 0  . carisoprodol (SOMA) 350 MG tablet TAKE 1 TABLET BY MOUTH 4 TIMES DAILY FOR SEIZURE PREVENTION 90 tablet 3  . Cholecalciferol (VITAMIN D) 2000 units CAPS Take 2,000 Units by mouth daily.    . Cyanocobalamin (VITAMIN B-12 PO) Take 2,500 Units by mouth daily.    . divalproex (DEPAKOTE ER) 250 MG 24 hr tablet Take 500 mg by mouth 2 (two) times daily.    Marland Kitchen gabapentin (NEURONTIN) 300 MG capsule TAKE 3 CAPSULES BY MOUTH IN THE MORNING AND 3 CAPSULES IN THE EVENING 540 capsule 3  . HYDROcodone-acetaminophen (NORCO) 10-325 MG tablet Take 1 tablet by mouth every 4 (four) hours as needed. 90 tablet 0  . hydrocortisone (CORTEF) 10 MG tablet Take 10 mg by mouth as directed. 1 tablet between 6-8am, 1/2 tablet at 11am, 1/2 tablet between 2-4pm  daily, doubling dose on sick days    . levocetirizine (XYZAL) 5 MG tablet Take 1 tablet (5 mg total) by mouth every evening. 60 tablet 5  . LORazepam (ATIVAN) 1 MG tablet Take 1 tablet (1 mg total) by mouth every 8 (eight) hours as needed for anxiety or seizure. 90 tablet 0  . montelukast (SINGULAIR) 10 MG tablet TAKE 1 TABLET BY MOUTH AT BEDTIME 30 tablet 0  . prochlorperazine (COMPAZINE) 10 MG tablet Take 1 tablet (10 mg total) by mouth every 6 (six) hours as needed for nausea or vomiting. 60 tablet 0   No current facility-administered medications for this visit.     PHYSICAL EXAMINATION: ECOG PERFORMANCE STATUS: 1 - Symptomatic but completely ambulatory  Vitals:   03/06/17 1143  BP: (!) 155/99  Pulse: 73  Resp: 18  Temp: 98.3 F (36.8 C)  SpO2: 100%   Filed Weights   03/06/17 1143  Weight: 148 lb 6.4 oz (67.3 kg)    GENERAL:alert, no distress  and comfortable SKIN: skin color, texture, turgor are normal, no rashes or significant lesions EYES: normal, Conjunctiva are pink and non-injected, sclera clear Musculoskeletal:no cyanosis of digits and no clubbing  NEURO: alert & oriented x 3 with fluent speech, no focal motor/sensory deficits  LABORATORY DATA:  I have reviewed the data as listed    Component Value Date/Time   NA 141 03/06/2017 1110   K 4.1 03/06/2017 1110   CL 103 12/16/2016 0348   CO2 30 (H) 03/06/2017 1110   GLUCOSE 88 03/06/2017 1110   BUN 5.9 (L) 03/06/2017 1110   CREATININE 0.7 03/06/2017 1110   CALCIUM 9.3 03/06/2017 1110   PROT 6.6 03/06/2017 1110   ALBUMIN 3.8 03/06/2017 1110   AST 12 03/06/2017 1110   ALT 12 03/06/2017 1110   ALKPHOS 39 (L) 03/06/2017 1110   BILITOT 0.38 03/06/2017 1110   GFRNONAA >60 12/15/2016 2314   GFRAA >60 12/15/2016 2314    No results found for: SPEP, UPEP  Lab Results  Component Value Date   WBC 3.1 (L) 03/06/2017   NEUTROABS 2.0 03/06/2017   HGB 11.4 (L) 03/06/2017   HCT 33.5 (L) 03/06/2017   MCV 99.1  03/06/2017   PLT 172 03/06/2017      Chemistry      Component Value Date/Time   NA 141 03/06/2017 1110   K 4.1 03/06/2017 1110   CL 103 12/16/2016 0348   CO2 30 (H) 03/06/2017 1110   BUN 5.9 (L) 03/06/2017 1110   CREATININE 0.7 03/06/2017 1110      Component Value Date/Time   CALCIUM 9.3 03/06/2017 1110   ALKPHOS 39 (L) 03/06/2017 1110   AST 12 03/06/2017 1110   ALT 12 03/06/2017 1110   BILITOT 0.38 03/06/2017 1110       RADIOGRAPHIC STUDIES: I have personally reviewed the radiological images as listed and agreed with the findings in the report. Mr Jeri Cos Wo Contrast  Result Date: 02/04/2017 CLINICAL DATA:  Addison disease. History of brain tumor resection oligodendroglioma EXAM: MRI HEAD WITHOUT AND WITH CONTRAST TECHNIQUE: Multiplanar, multiecho pulse sequences of the brain and surrounding structures were obtained without and with intravenous contrast. CONTRAST:  43m MULTIHANCE GADOBENATE DIMEGLUMINE 529 MG/ML IV SOLN COMPARISON:  MRI head 12/16/2016 FINDINGS: Brain: Dynamic pituitary protocol was performed to evaluate the pituitary. The pituitary is normal in size measuring 8.3 mm in height. Pituitary enhances homogeneously and normally without microadenoma. Infundibulum midline. No compression optic chiasm. Cavernous sinus normal bilaterally. Postop resection of right frontal convexity tumor. Tumor bed is stable with evidence of chronic hemosiderin. T2 and FLAIR hyperintensity surrounding the resection site is stable. No enhancing tumor mass postcontrast administration. Ventricles nondilated. Negative for acute infarct. Vascular: Normal arterial flow voids. Skull and upper cervical spine: Right craniotomy Sinuses/Orbits: Negative Other: None IMPRESSION: Normal pituitary Stable postop changes of right frontal tumor resection Electronically Signed   By: CFranchot GalloM.D.   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 I recommend observation with surveillance imaging every 3 months, next due in October 2018 [facility] 6 weeks for port flushes and blood draw We also discussed about port removal but the patient is not ready  Low serum cortisol level The patient is undergoing extensive management through endocrinology  office and is currently taking Cortef I would defer to them for further management  Intractable chronic cluster headache The patient is currently being managed extensively here for chronic headaches since her surgery Per previous discussion, I would like to simplify her regimen We have discontinue morphine sulfate and currently she is only taking hydrocodone, along with Fioricet for chronic headache management Since she is gaining weight and have no recent nausea, I would discontinue Marinol  Cognitive impairment She has chronic negative impairments and recently has been prescribed Adderall Continue the same   No orders of the defined types were placed in this encounter.  All questions were answered. The patient knows to call the clinic with any problems, questions or concerns. No barriers to learning was detected. I spent 15 minutes counseling the patient face to face. The total time spent in the appointment was 20 minutes and more than 50% was on counseling and review of test results     Heath Lark, MD 03/06/2017 12:43 PM

## 2017-03-06 NOTE — Assessment & Plan Note (Signed)
The patient is undergoing extensive management through endocrinology office and is currently taking Cortef I would defer to them for further management

## 2017-03-06 NOTE — Assessment & Plan Note (Signed)
The patient is currently being managed extensively here for chronic headaches since her surgery Per previous discussion, I would like to simplify her regimen We have discontinue morphine sulfate and currently she is only taking hydrocodone, along with Fioricet for chronic headache management Since she is gaining weight and have no recent nausea, I would discontinue Marinol

## 2017-03-08 MED FILL — CARISOPRODOL 350 MG TABLET: 350 | 22 days supply | Qty: 90 | Fill #2

## 2017-03-21 ENCOUNTER — Telehealth: Payer: Self-pay

## 2017-03-21 ENCOUNTER — Telehealth: Payer: Self-pay | Admitting: *Deleted

## 2017-03-21 MED ORDER — HYDROCODONE-ACETAMINOPHEN 10-325 MG PO TABS: 1.0000 | ORAL_TABLET | ORAL | 0 refills | Status: DC | PRN

## 2017-03-21 MED ORDER — LORAZEPAM 1 MG PO TABS: 1.0000 mg | ORAL_TABLET | Freq: Three times a day (TID) | ORAL | 0 refills | Status: DC | PRN

## 2017-03-21 MED FILL — LORazepam 1 MG TABS: 1 | 30 days supply | Qty: 90 | Fill #0

## 2017-03-21 MED FILL — HYDROCODON-APAP 10-325: 10-325 | 15 days supply | Qty: 90 | Fill #0

## 2017-03-21 NOTE — Telephone Encounter (Signed)
Reinforced that she should not be taking marinol and adderall at the same time. Pain in stomach could be from taking high dose steroids. Will follow up with Dr Paulita Fujita. Will refill hydrocodone and ativan

## 2017-03-21 NOTE — Telephone Encounter (Signed)
Pt stopped marinol, she has lost #3 over the weekend. She did restart it on Sunday. She is in pain whenever she eats anything or water even. The marinol also helps prevent pain when she eats.  She will need a refill on the marinol soon (she has a few more days of this) She also needs hydrocodone and ativan refills

## 2017-03-21 NOTE — Telephone Encounter (Signed)
When I saw her, I stopped Marinol because of polypharmacy Her PCP started her on Adderrall; I would not recommend her to be on both Marinol does not prevent pain when she eats; she needs GI evaluation. That sounds like stomach ulcer OK to refill hydrocodone and ativan

## 2017-03-27 ENCOUNTER — Other Ambulatory Visit: Payer: Self-pay | Admitting: Hematology and Oncology

## 2017-03-27 ENCOUNTER — Other Ambulatory Visit: Payer: Self-pay | Admitting: Allergy and Immunology

## 2017-03-27 DIAGNOSIS — T50905D Adverse effect of unspecified drugs, medicaments and biological substances, subsequent encounter: Secondary | ICD-10-CM

## 2017-03-27 MED FILL — CARISOPRODOL 350 MG TABLET: 350 | 22 days supply | Qty: 90 | Fill #3

## 2017-03-29 MED FILL — BUTALB-ACETAMIN-CAFF 50-325: 50-325-40 | 23 days supply | Qty: 90 | Fill #0

## 2017-04-04 ENCOUNTER — Other Ambulatory Visit: Payer: Self-pay

## 2017-04-04 MED ORDER — HYDROCODONE-ACETAMINOPHEN 10-325 MG PO TABS: 1.0000 | ORAL_TABLET | ORAL | 0 refills | Status: DC | PRN

## 2017-04-04 MED FILL — HYDROCODON-APAP 10-325: 10-325 | 15 days supply | Qty: 90 | Fill #0

## 2017-04-17 ENCOUNTER — Telehealth: Payer: Self-pay

## 2017-04-17 DIAGNOSIS — C711 Malignant neoplasm of frontal lobe: Secondary | ICD-10-CM

## 2017-04-17 DIAGNOSIS — R51 Headache: Secondary | ICD-10-CM

## 2017-04-17 DIAGNOSIS — R110 Nausea: Secondary | ICD-10-CM

## 2017-04-17 DIAGNOSIS — R519 Headache, unspecified: Secondary | ICD-10-CM

## 2017-04-17 DIAGNOSIS — R4702 Dysphasia: Secondary | ICD-10-CM

## 2017-04-17 DIAGNOSIS — R0789 Other chest pain: Secondary | ICD-10-CM

## 2017-04-17 DIAGNOSIS — R202 Paresthesia of skin: Secondary | ICD-10-CM

## 2017-04-17 NOTE — Telephone Encounter (Signed)
Pt called for soma refill. Her husband will pick up the rx. His phone is [phone].

## 2017-04-18 MED ORDER — CARISOPRODOL 350 MG PO TABS: ORAL_TABLET | ORAL | 3 refills | Status: DC

## 2017-04-18 NOTE — Telephone Encounter (Signed)
Husband came in to pick up rx. rx prepared for signature.

## 2017-04-18 NOTE — Addendum Note (Signed)
Addended by: Janace Hoard on: 04/18/2017 02:36 PM   Modules accepted: Orders

## 2017-04-20 ENCOUNTER — Other Ambulatory Visit (HOSPITAL_BASED_OUTPATIENT_CLINIC_OR_DEPARTMENT_OTHER): Payer: Medicare Other

## 2017-04-20 ENCOUNTER — Ambulatory Visit (HOSPITAL_BASED_OUTPATIENT_CLINIC_OR_DEPARTMENT_OTHER): Payer: Medicare Other

## 2017-04-20 ENCOUNTER — Ambulatory Visit (HOSPITAL_BASED_OUTPATIENT_CLINIC_OR_DEPARTMENT_OTHER): Payer: Medicare Other | Admitting: Hematology and Oncology

## 2017-04-20 ENCOUNTER — Telehealth: Payer: Self-pay | Admitting: Hematology and Oncology

## 2017-04-20 ENCOUNTER — Telehealth: Payer: Self-pay | Admitting: *Deleted

## 2017-04-20 VITALS — BP 123/77 | HR 85 | Temp 98.3°F | Resp 18 | Ht 69.0 in | Wt 136.6 lb

## 2017-04-20 DIAGNOSIS — T50905D Adverse effect of unspecified drugs, medicaments and biological substances, subsequent encounter: Secondary | ICD-10-CM

## 2017-04-20 DIAGNOSIS — C711 Malignant neoplasm of frontal lobe: Secondary | ICD-10-CM

## 2017-04-20 DIAGNOSIS — D61810 Antineoplastic chemotherapy induced pancytopenia: Secondary | ICD-10-CM

## 2017-04-20 DIAGNOSIS — R4189 Other symptoms and signs involving cognitive functions and awareness: Secondary | ICD-10-CM | POA: Diagnosis not present

## 2017-04-20 DIAGNOSIS — G40109 Localization-related (focal) (partial) symptomatic epilepsy and epileptic syndromes with simple partial seizures, not intractable, without status epilepticus: Secondary | ICD-10-CM | POA: Diagnosis not present

## 2017-04-20 DIAGNOSIS — T451X5A Adverse effect of antineoplastic and immunosuppressive drugs, initial encounter: Secondary | ICD-10-CM

## 2017-04-20 DIAGNOSIS — G44021 Chronic cluster headache, intractable: Secondary | ICD-10-CM | POA: Diagnosis not present

## 2017-04-20 LAB — CBC WITH DIFFERENTIAL/PLATELET
BASO%: 0.5 % (ref 0.0–2.0)
Basophils Absolute: 0 10*3/uL (ref 0.0–0.1)
EOS%: 1 % (ref 0.0–7.0)
Eosinophils Absolute: 0 10*3/uL (ref 0.0–0.5)
HCT: 32.8 % — ABNORMAL LOW (ref 34.8–46.6)
HGB: 11.3 g/dL — ABNORMAL LOW (ref 11.6–15.9)
LYMPH%: 40.8 % (ref 14.0–49.7)
MCH: 33.4 pg (ref 25.1–34.0)
MCHC: 34.5 g/dL (ref 31.5–36.0)
MCV: 97 fL (ref 79.5–101.0)
MONO#: 0.2 10*3/uL (ref 0.1–0.9)
MONO%: 11.2 % (ref 0.0–14.0)
NEUT#: 1 10*3/uL — ABNORMAL LOW (ref 1.5–6.5)
NEUT%: 46.5 % (ref 38.4–76.8)
Platelets: 161 10*3/uL (ref 145–400)
RBC: 3.38 10*6/uL — ABNORMAL LOW (ref 3.70–5.45)
RDW: 11.3 % (ref 11.2–14.5)
WBC: 2.1 10*3/uL — ABNORMAL LOW (ref 3.9–10.3)
lymph#: 0.8 10*3/uL — ABNORMAL LOW (ref 0.9–3.3)

## 2017-04-20 LAB — COMPREHENSIVE METABOLIC PANEL
ALT: 12 U/L (ref 0–55)
AST: 14 U/L (ref 5–34)
Albumin: 4.2 g/dL (ref 3.5–5.0)
Alkaline Phosphatase: 42 U/L (ref 40–150)
Anion Gap: 9 mEq/L (ref 3–11)
BUN: 12.1 mg/dL (ref 7.0–26.0)
CO2: 26 mEq/L (ref 22–29)
Calcium: 9.9 mg/dL (ref 8.4–10.4)
Chloride: 105 mEq/L (ref 98–109)
Creatinine: 0.7 mg/dL (ref 0.6–1.1)
EGFR: >90 mL/min/{1.73_m2} (ref >90)
Glucose: 85 mg/dl (ref 70–140)
Potassium: 4.1 mEq/L (ref 3.5–5.1)
Sodium: 140 mEq/L (ref 136–145)
Total Bilirubin: 0.44 mg/dL (ref 0.20–1.20)
Total Protein: 7 g/dL (ref 6.4–8.3)

## 2017-04-20 MED ORDER — LEVOCETIRIZINE DIHYDROCHLORIDE 5 MG PO TABS: 5.0000 mg | ORAL_TABLET | Freq: Every evening | ORAL | 5 refills | Status: DC

## 2017-04-20 MED ORDER — BUTALBITAL-APAP-CAFFEINE 50-325-40 MG PO TABS: ORAL_TABLET | ORAL | 0 refills | Status: DC

## 2017-04-20 MED ORDER — HYDROCODONE-ACETAMINOPHEN 10-325 MG PO TABS: 1.0000 | ORAL_TABLET | ORAL | 0 refills | Status: DC | PRN

## 2017-04-20 MED ORDER — SODIUM CHLORIDE 0.9 % IJ SOLN
10.0000 mL | INTRAMUSCULAR | Status: DC | PRN
  Administered 2017-04-20: 10 mL via INTRAVENOUS
  Filled 2017-04-20: qty 10

## 2017-04-20 MED ORDER — HEPARIN SOD (PORK) LOCK FLUSH 100 UNIT/ML IV SOLN
500.0000 [IU] | Freq: Once | INTRAVENOUS | Status: AC | PRN
  Administered 2017-04-20: 500 [IU] via INTRAVENOUS
  Filled 2017-04-20: qty 5

## 2017-04-20 MED FILL — BUTALB-ACETAMIN-CAFF 50-325: 50-325-40 | 23 days supply | Qty: 90 | Fill #0

## 2017-04-20 MED FILL — HYDROCODON-APAP 10-325: 10-325 | 15 days supply | Qty: 90 | Fill #0

## 2017-04-20 NOTE — Telephone Encounter (Signed)
At request of Dr Alvy Bimler, appointment given with Dr. Mickeal Skinner for 04/21/2017 @ 11:00.  Patient acceptable of date and time.

## 2017-04-20 NOTE — Telephone Encounter (Signed)
Scheduled appts per 10/4 los. Gave patient avs and calendar.

## 2017-04-21 ENCOUNTER — Telehealth: Payer: Self-pay | Admitting: *Deleted

## 2017-04-21 ENCOUNTER — Encounter: Payer: Self-pay | Admitting: Hematology and Oncology

## 2017-04-21 ENCOUNTER — Telehealth: Payer: Self-pay | Admitting: Internal Medicine

## 2017-04-21 ENCOUNTER — Ambulatory Visit (HOSPITAL_BASED_OUTPATIENT_CLINIC_OR_DEPARTMENT_OTHER): Payer: Medicare Other | Admitting: Internal Medicine

## 2017-04-21 ENCOUNTER — Encounter: Payer: Self-pay | Admitting: Internal Medicine

## 2017-04-21 VITALS — BP 118/86 | HR 100 | Temp 97.8°F | Resp 18 | Ht 69.0 in | Wt 136.8 lb

## 2017-04-21 DIAGNOSIS — C711 Malignant neoplasm of frontal lobe: Secondary | ICD-10-CM | POA: Diagnosis not present

## 2017-04-21 NOTE — Assessment & Plan Note (Signed)
The patient has poor memory and significant cognitive impairment The patient desired to be put back on Marinol I am concerned concerned about medication interaction with all her other medications I am not comfortable prescribing this I recommend advance home care to go to her house and assess medication compliance but the patient declined this

## 2017-04-21 NOTE — Telephone Encounter (Signed)
Contacted patients primary insurance [employer] @ [phone] started prior authorization for Marinol Rx (paperscript) written by Dr. Mickeal Skinner.  ID # [id number] Group # [id number]  Can take up to 72 business hours to process.  Notified patient that process was started.

## 2017-04-21 NOTE — Assessment & Plan Note (Signed)
She has persistent pancytopenia of unknown etiology She is not symptomatic I recommend observation only

## 2017-04-21 NOTE — Assessment & Plan Note (Signed)
She has intractable headaches In the past, we have extensive discussion about pain management I do not recommend concurrent use of morphine or hydrocodone I refill her prescription hydrocodone now She can continue to use Fioricet I recommend she discuss the use of migraine medications with her neurologist

## 2017-04-21 NOTE — Progress Notes (Signed)
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      09/27/2013 Pathology Results    Accession: [id number] pathology showed grade 2 oligodendroglioma with mutant IDH1 protein, ATRX, OLIG 2 expression. p53 is negative, low Ki-67 labeling index. 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      11/06/2013 Imaging    Repeat CT scan of the head showed resolution of abscess.      01/23/2014 Surgery    Dr. Christella Noa perform a subtotal gross resection of the tumor.      01/24/2014 Imaging    Repeat imaging studies showed persistent and residual disease.      03/03/2014 - 09/01/2014 Chemotherapy    She was started on Temodar, 5 days every 28 days      03/13/2014 Imaging    Repeat MRI showed no evidence of progression of disease.      07/14/2014 Imaging    Repeat MRI showed no evidence of disease progression.      09/12/2014 Imaging    MRI of the liver show mild regression of the size of lesions, presumed to be benign      09/15/2014 Imaging    Repeat MRI of the brain show persistent abnormalities, suspicious for residual disease      03/02/2015 Imaging     MRI brain at [place] show persistent abnormalities      08/10/2015 Imaging    Repeat MRI [facility] showed stable disease      08/24/2015 - 02/08/2016 Chemotherapy     She was started on Lomustine, Procarbazine and Vincristine chemotherapy      08/30/2015 Imaging    CT chest showed no evidence of aspiration pneumonia      11/18/2015 Imaging    MRI brain showed status post resection of right frontal lobe tumor without residual or recurrent tumor. 2. Otherwise negative MRI of the brain.       03/02/2016 Imaging    Stable MRI post right frontal lobe tumor resection. Negative for recurrent tumor      06/01/2016 Imaging    Stable MRI post right frontal lobe tumor resection. No recurrent tumor identified      08/19/2016 Imaging    Unchanged examination without tumor recurrence.      11/17/2016 Imaging    Stable right frontal resection cavity. Stable focal mass effect within the right middle frontal gyrus at the superior margin of the cavity which may represent nonenhancing neoplasm. No evidence for tumor progression. Otherwise stable MRI of the brain      12/16/2016 Imaging    MR brain: 1. No acute intracranial abnormality. 2. Unchanged appearance of right frontal lobe resection cavity and surrounding hyperintense T2 weighted signal.      02/04/2017 Imaging    Repeat MRI Normal pituitary  Stable postop changes of right frontal tumor resection       INTERVAL HISTORY: Please  see below for problem oriented charting. She returns for further follow-up The patient expressed significant concern about recurrent seizures She had almost daily she seizures over the past few weeks Most of her seizures are triggered with stress She denies permanent neurological deficit but have recurrent memory loss She is not sure if she is taking her medications correctly She has lost some weight She have discontinue her steroid medications for unknown reasons She complain of severe headaches on a daily basis  REVIEW OF SYSTEMS:   Constitutional: Denies fevers, chills  Eyes: Denies blurriness of vision Ears, nose, mouth, throat, and face: Denies mucositis or sore  throat Respiratory: Denies cough, dyspnea or wheezes Cardiovascular: Denies palpitation, chest discomfort or lower extremity swelling Gastrointestinal:  Denies nausea, heartburn or change in bowel habits Skin: Denies abnormal skin rashes Lymphatics: Denies new lymphadenopathy or easy bruising All other systems were reviewed with the patient and are negative.  I have reviewed the past medical history, past surgical history, social history and family history with the patient and they are unchanged from previous note.  ALLERGIES:  is allergic to adhesive [tape]; latex; percocet [oxycodone-acetaminophen]; shellfish allergy; skin adhesives; soma [carisoprodol]; tramadol hcl; and vancomycin.  MEDICATIONS:  Current Outpatient Prescriptions  Medication Sig Dispense Refill  . ALPRAZolam (XANAX) 1 MG tablet Take 1.5 mg by mouth at bedtime.    Marland Kitchen amphetamine-dextroamphetamine (ADDERALL XR) 10 MG 24 hr capsule Take 1 capsule by mouth every morning.  0  . butalbital-acetaminophen-caffeine (FIORICET, ESGIC) 50-325-40 MG tablet TAKE 1 TABLET BY MOUTH EVERY 6 HOURS AS NEEDED FOR HEADACHE 90 tablet 0  . carisoprodol (SOMA) 350 MG tablet TAKE 1 TABLET BY MOUTH 4 TIMES DAILY FOR SEIZURE PREVENTION 90 tablet 3  . Cholecalciferol (VITAMIN D) 2000 units CAPS Take 2,000 Units by mouth daily.    . Cyanocobalamin (VITAMIN B-12 PO) Take 2,500 Units by mouth daily.    . divalproex (DEPAKOTE ER) 250 MG 24 hr tablet Take 500 mg by mouth 2 (two) times daily.    Marland Kitchen dronabinol (MARINOL) 10 MG capsule Take 1 capsule by mouth as needed.    . gabapentin (NEURONTIN) 300 MG capsule TAKE 3 CAPSULES BY MOUTH IN THE MORNING AND 3 CAPSULES IN THE EVENING 540 capsule 3  . HYDROcodone-acetaminophen (NORCO) 10-325 MG tablet Take 1 tablet by mouth every 4 (four) hours as needed. 90 tablet 0  . hydrocortisone (CORTEF) 10 MG tablet Take 10 mg by mouth as directed. 1 tablet between 6-8am, 1/2 tablet at 11am, 1/2 tablet between 2-4pm daily,  doubling dose on sick days    . levocetirizine (XYZAL) 5 MG tablet Take 1 tablet (5 mg total) by mouth every evening. 60 tablet 5  . LORazepam (ATIVAN) 1 MG tablet Take 1 tablet (1 mg total) by mouth every 8 (eight) hours as needed for anxiety or seizure. 90 tablet 0  . montelukast (SINGULAIR) 10 MG tablet TAKE 1 TABLET BY MOUTH AT BEDTIME 30 tablet 0  . prochlorperazine (COMPAZINE) 10 MG tablet Take 1 tablet (10 mg total) by mouth every 6 (six) hours as needed for nausea or vomiting. (Patient not taking: Reported on 04/21/2017) 60 tablet 0   No current facility-administered medications for this visit.     PHYSICAL EXAMINATION: ECOG PERFORMANCE STATUS: 1 - Symptomatic but completely ambulatory  Vitals:   04/20/17 1220  BP: 123/77  Pulse: 85  Resp: 18  Temp: 98.3 F (36.8 C)  SpO2: 100%   Filed Weights   04/20/17 1220  Weight: 136  lb 9.6 oz (62 kg)    GENERAL:alert, no distress and comfortable SKIN: skin color, texture, turgor are normal, no rashes or significant lesions EYES: normal, Conjunctiva are pink and non-injected, sclera clear Musculoskeletal:no cyanosis of digits and no clubbing  NEURO: alert & oriented x 3 with fluent speech, no focal motor/sensory deficits  LABORATORY DATA:  I have reviewed the data as listed    Component Value Date/Time   NA 140 04/20/2017 1140   K 4.1 04/20/2017 1140   CL 103 12/16/2016 0348   CO2 26 04/20/2017 1140   GLUCOSE 85 04/20/2017 1140   BUN 12.1 04/20/2017 1140   CREATININE 0.7 04/20/2017 1140   CALCIUM 9.9 04/20/2017 1140   PROT 7.0 04/20/2017 1140   ALBUMIN 4.2 04/20/2017 1140   AST 14 04/20/2017 1140   ALT 12 04/20/2017 1140   ALKPHOS 42 04/20/2017 1140   BILITOT 0.44 04/20/2017 1140   GFRNONAA >60 12/15/2016 2314   GFRAA >60 12/15/2016 2314    No results found for: SPEP, UPEP  Lab Results  Component Value Date   WBC 2.1 (L) 04/20/2017   NEUTROABS 1.0 (L) 04/20/2017   HGB 11.3 (L) 04/20/2017   HCT 32.8 (L)  04/20/2017   MCV 97.0 04/20/2017   PLT 161 04/20/2017      Chemistry      Component Value Date/Time   NA 140 04/20/2017 1140   K 4.1 04/20/2017 1140   CL 103 12/16/2016 0348   CO2 26 04/20/2017 1140   BUN 12.1 04/20/2017 1140   CREATININE 0.7 04/20/2017 1140      Component Value Date/Time   CALCIUM 9.9 04/20/2017 1140   ALKPHOS 42 04/20/2017 1140   AST 14 04/20/2017 1140   ALT 12 04/20/2017 1140   BILITOT 0.44 04/20/2017 1140      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  She agreed The patient would need to have repeat imaging study soon  Pancytopenia due to antineoplastic chemotherapy ([place]) She has persistent pancytopenia of unknown etiology She is not symptomatic I recommend observation only  Simple partial seizures She has recurrent, uncontrolled seizures which is getting more frequent I am concerned about medical compliance The patient is very forgetful Recommend neuro-oncologist consultation I will not change any of her medications for now  Intractable chronic cluster headache She has intractable headaches In the past, we have extensive discussion about pain management I do not recommend concurrent use of morphine or hydrocodone I refill her prescription hydrocodone now She can continue to use Fioricet I recommend she discuss the use of migraine medications with her neurologist  Cognitive impairment The patient has poor memory and significant cognitive impairment The patient desired to be put back on Marinol I am concerned concerned about medication interaction with all her other medications I am not comfortable prescribing this I recommend advance home care to go to her house and assess  medication compliance but the patient declined this   Orders Placed This Encounter  Procedures  . Amb Referral to Neuro Oncology    Referral Priority:   Urgent    Referral Type:   Consultation    Referral Reason:   Specialty Services Required    Referred to Provider:   Ventura Sellers, MD  Number of Visits Requested:   1   All questions were answered. The patient knows to call the clinic with any problems, questions or concerns. No barriers to learning was detected. I spent 25 minutes counseling the patient face to face. The total time spent in the appointment was 30 minutes and more than 50% was on counseling and review of test results     Heath Lark, MD 04/21/2017 3:56 PM

## 2017-04-21 NOTE — Progress Notes (Signed)
[facility] [place], [place] [address] [phone]   New Patient Evaluation  Date of Service: 04/21/17 Patient Name: Victoria Bates Patient MRN: [id number] Patient DOB: 20-Jan-1981 Provider: Ventura Sellers, MD  Identifying Statement:  Victoria Bates is a 36 y.o. 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      09/27/2013 Pathology Results    Accession: [id number] pathology showed grade 2 oligodendroglioma with mutant IDH1 protein, ATRX, OLIG 2 expression. p53 is negative, low Ki-67 labeling index. 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      11/06/2013 Imaging    Repeat CT scan of the head showed resolution of abscess.      01/23/2014 Surgery    Dr. Christella Noa perform a subtotal gross resection of the tumor.      01/24/2014 Imaging    Repeat imaging studies showed persistent and residual disease.      03/03/2014 - 09/01/2014 Chemotherapy    She was started on Temodar, 5 days every 28 days      03/13/2014 Imaging    Repeat MRI showed no evidence of progression of disease.      07/14/2014 Imaging    Repeat MRI showed no evidence of disease progression.      09/12/2014 Imaging    MRI of the liver show mild regression of the size of lesions, presumed to be benign      09/15/2014 Imaging    Repeat MRI of the brain show persistent abnormalities, suspicious for residual disease      03/02/2015 Imaging     MRI brain at [facility] show persistent abnormalities      08/10/2015 Imaging    Repeat  MRI [facility] showed stable disease      08/24/2015 - 02/08/2016 Chemotherapy    She was started on Lomustine, Procarbazine and Vincristine chemotherapy      08/30/2015 Imaging    CT chest showed no evidence of aspiration pneumonia      11/18/2015 Imaging    MRI brain showed status post resection of right frontal lobe tumor without residual or recurrent tumor. 2. Otherwise negative MRI of the brain.       03/02/2016 Imaging    Stable MRI post right frontal lobe tumor resection. Negative for recurrent tumor      06/01/2016 Imaging    Stable MRI post right frontal lobe tumor resection. No recurrent tumor identified      08/19/2016 Imaging    Unchanged examination without tumor recurrence.      11/17/2016 Imaging    Stable right frontal resection cavity. Stable focal mass effect within the right middle frontal gyrus at the superior margin of the cavity which may represent nonenhancing neoplasm. No evidence for tumor progression. Otherwise stable MRI of the brain      12/16/2016 Imaging    MR brain: 1. No acute intracranial abnormality. 2. Unchanged appearance of right frontal lobe resection cavity and surrounding hyperintense T2 weighted signal.      02/04/2017 Imaging    Repeat  MRI Normal pituitary  Stable postop changes of right frontal tumor resection       Biomarkers:  MGMT Unknown.  IDH 1/2 mutated with 1p/19q codeletion.  EGFR Unknown  TERT Unknown   History of Present Illness: The patient's records from the referring physician were obtained and reviewed and the patient interviewed to confirm this HPI.  Victoria Bates initially presented to medical attention in March of 2015 after experiencing seizures and unusual behaviors.  An MRI demonstrated a non-enhancing lesion confined to the right frontal lobe.  She underwent biopsy with Dr. Christella Noa later that month which demonstrated a WHO grade II glioma, IDH mutated and 1p/19q co-deleted.  In July of 2018 he returned for full  resection, which was tolerated well.  As far as treatment, she went through 6 cycles of 5-day Temodar, and roughly 5 months of PCV therapy in 2016 and 2017, respectively.  She has not had exposure to radiation.    Over the past 3 years, she has had frequent seizures, characterized variously as "hands trembing/shaking", "change in awareness", and "loss in consciousness and memory".  She is currently on Depakote 510m BID and Gabapentin.  In addition she has had two separate admissions for long term EEG monitoring, neither of which captured electrical events. Seizures are being managed currently by Dr. ADelice Lesch  Previously she was not able to tolerate Keppra because of mood issues.      She is also plagued by multiple chronic issues: fatigue, daily headaches, back pain, anxiety, cognitive impairment, and issues with blood pressure/orthostasis.  Having been diagnosed with addison's, she is currently on a regimen of cortef managed by her endocrinologist Dr. KBuddy Duty    She lives at home with husband, two children.  Is on disability not working.  Medications: Current Outpatient Prescriptions on File Prior to Visit  Medication Sig Dispense Refill  . ALPRAZolam (XANAX) 1 MG tablet Take 1.5 mg by mouth at bedtime.    . butalbital-acetaminophen-caffeine (FIORICET, ESGIC) 50-325-40 MG tablet TAKE 1 TABLET BY MOUTH EVERY 6 HOURS AS NEEDED FOR HEADACHE 90 tablet 0  . carisoprodol (SOMA) 350 MG tablet TAKE 1 TABLET BY MOUTH 4 TIMES DAILY FOR SEIZURE PREVENTION 90 tablet 3  . Cholecalciferol (VITAMIN D) 2000 units CAPS Take 2,000 Units by mouth daily.    . Cyanocobalamin (VITAMIN B-12 PO) Take 2,500 Units by mouth daily.    . divalproex (DEPAKOTE ER) 250 MG 24 hr tablet Take 500 mg by mouth 2 (two) times daily.    .Marland Kitchengabapentin (NEURONTIN) 300 MG capsule TAKE 3 CAPSULES BY MOUTH IN THE MORNING AND 3 CAPSULES IN THE EVENING 540 capsule 3  . HYDROcodone-acetaminophen (NORCO) 10-325 MG tablet Take 1 tablet by mouth  every 4 (four) hours as needed. 90 tablet 0  . hydrocortisone (CORTEF) 10 MG tablet Take 10 mg by mouth as directed. 1 tablet between 6-8am, 1/2 tablet at 11am, 1/2 tablet between 2-4pm daily, doubling dose on sick days    . levocetirizine (XYZAL) 5 MG tablet Take 1 tablet (5 mg total) by mouth every evening. 60 tablet 5  . LORazepam (ATIVAN) 1 MG tablet Take 1 tablet (1 mg total) by mouth every 8 (eight) hours as needed for anxiety or seizure. 90 tablet 0  . montelukast (SINGULAIR) 10 MG tablet TAKE 1 TABLET BY MOUTH AT BEDTIME 30 tablet 0  . prochlorperazine (COMPAZINE) 10 MG tablet Take 1 tablet (10 mg total) by mouth every 6 (six) hours as needed for nausea or vomiting. 6[place]umba  tablet 0   No current facility-administered medications on file prior to visit.     Allergies:  Allergies  Allergen Reactions  . Adhesive [Tape] Other (See Comments)    Skin tear  . Latex Hives  . Percocet [Oxycodone-Acetaminophen] Other (See Comments)    makes pt angry  . Shellfish Allergy Diarrhea and Nausea And Vomiting  . Skin Adhesives     Per patient states reaction to dermabond when had hysterectomy. Stated was red and irritated.   Manuela Neptune [Carisoprodol]     Watson brand, specifically, causes disorientation (can take other brands)  . Tramadol Hcl Other (See Comments)    Potentially worsen seizure control  . Vancomycin Hives   Past Medical History:  Past Medical History:  Diagnosis Date  . Anemia   . Anxiety   . Asthma    very mild- no inhaler use since July 2013  . Brain tumor ([facility])    Hx: of  . Cancer ([facility]) 2003   skin  . Chronic fatigue 10/03/2014  . Cognitive impairment 08/13/2015  . Complication of anesthesia    takes longer for patient to go under anesthesia  . Dizziness   . Dysphasia 02/12/2014  . Glioma, malignant ([facility]) dx'd 09/2013  . Headache 02/12/2014  . Headache(784.0)    migraines  . Herniated disc   . Hot flashes 10/03/2014  . Infection    to right head incision  . Lactation  disorder 10/03/2014  . Liver hemangioma   . Liver lesion 08/22/2014  . Nausea alone 02/12/2014  . Neuropathic pain 04/08/2014  . Petit mal seizure status ([facility])   . Seizures ([place])    petit mal serizures most days   Past Surgical History:  Past Surgical History:  Procedure Laterality Date  . ABDOMINAL HYSTERECTOMY    . CESAREAN SECTION    . COLONOSCOPY WITH PROPOFOL N/A 08/29/2012   Procedure: COLONOSCOPY WITH PROPOFOL;  Surgeon: Arta Silence, MD;  Location: [facility];  Service: Endoscopy;  Laterality: N/A;  . coposcopy  08/06/2012   cervical biopsy  . CRANIOTOMY Right 09/26/2013   Procedure: Awake Right frontal open brain biopsy;  Surgeon: Winfield Cunas, MD;  Location: [facility];  Service: Neurosurgery;  Laterality: Right;  awake Right frontal open brain biopsy  . CRANIOTOMY N/A 10/30/2013   Procedure: debridement of cranial wound  right temporal incision, ;  Surgeon: Winfield Cunas, MD;  Location: [facility];  Service: Neurosurgery;  Laterality: N/A;  . CRANIOTOMY N/A 01/23/2014   Procedure: Awake Craniotomy with BrainLab;  Surgeon: Winfield Cunas, MD;  Location: [facility];  Service: Neurosurgery;  Laterality: N/A;  Awake Craniotomy  tumor with BrainLab  . ESOPHAGOGASTRODUODENOSCOPY (EGD) WITH PROPOFOL N/A 08/29/2012   Procedure: ESOPHAGOGASTRODUODENOSCOPY (EGD) WITH PROPOFOL;  Surgeon: Arta Silence, MD;  Location: [facility];  Service: Endoscopy;  Laterality: N/A;  . left knee surgery  2000   torn meniscus  . right flank surgery  2005   bnign mass from flank area-right  . ROBOTIC ASSISTED TOTAL HYSTERECTOMY WITH BILATERAL SALPINGO OOPHERECTOMY Bilateral 09/25/2012   Procedure: ROBOTIC ASSISTED TOTAL HYSTERECTOMY WITH BILATERAL SALPINGO OOPHORECTOMY;  Surgeon: Maeola Sarah. Landry Mellow, MD;  Location: [facility];  Service: Gynecology;  Laterality: Bilateral;  . ROTATOR CUFF REPAIR    . WISDOM TOOTH EXTRACTION     Social History:  Social History   Social History  . Marital status: Single     Spouse name: N/A  . Number of children: 2  . Years of education:  College   Occupational History  . laser [employer]   Social History Main Topics  . Smoking status: Never Smoker  . Smokeless tobacco: Never Used  . Alcohol use No  . Drug use: No  . Sexual activity: Yes    Birth control/ protection: None   Other Topics Concern  . Not on file   Social History Narrative   Patient lives at home with her two children.   Caffeine use: minimum of 4 cups daily   Family History:  Family History  Problem Relation Age of Onset  . Hypertension Mother   . Skin cancer Mother   . Thyroid cancer Father   . Skin cancer Father   . Breast cancer Maternal Aunt        dx in her 74s  . Prostate cancer Maternal Uncle 63  . Melanoma Maternal Uncle   . Ovarian cancer Maternal Grandmother        dx in her late 7s-40s  . Breast cancer Maternal Aunt        bilateral breast cancer dx in 35s and 82s; possibly BRCA+  . Other Son        multiple CAL spots, bright spots on brain MRI, ? chiari malformation    Review of Systems: Constitutional: Denies fevers, chills or abnormal weight loss Eyes: Denies blurriness of vision Ears, nose, mouth, throat, and face: Denies mucositis or sore throat Respiratory: Denies cough, dyspnea or wheezes Cardiovascular: Denies palpitation, chest discomfort or lower extremity swelling Gastrointestinal:  Denies nausea, constipation, diarrhea GU: Early satiety, poor appetite Skin: Denies abnormal skin rashes Neurological: Per HPI Musculoskeletal: Denies joint pain, back or neck discomfort. No decrease in ROM Behavioral/Psych: +anxiety  Physical Exam: Vitals:   04/21/17 1119  BP: 118/86  Pulse: 100  Resp: 18  Temp: 97.8 F (36.6 C)  SpO2: 100%   KPS: 90. General: Alert, cooperative, pleasant, in no acute distress Head: Craniotomy scar noted, dry and intact. EENT: No conjunctival injection or scleral icterus. Oral mucosa moist Lungs: Resp  effort normal Cardiac: Regular rate and rhythm Abdomen: Soft, non-distended abdomen Skin: No rashes cyanosis or petechiae. Extremities: No clubbing or edema  Neurologic Exam: Mental Status: Awake, alert, attentive to examiner. Oriented to self and environment. Language is fluent with intact comprehension.  Cranial Nerves: Visual acuity is grossly normal. Visual fields are full. Extra-ocular movements intact. No ptosis. Face is symmetric, tongue midline. Motor: Tone and bulk are normal. Power is full in both arms and legs. Reflexes are symmetric, no pathologic reflexes present. Intact finger to nose bilaterally Sensory: Intact to light touch and temperature Gait: Normal and tandem gait is normal.   Labs: I have reviewed the data as listed    Component Value Date/Time   NA 140 04/20/2017 1140   K 4.1 04/20/2017 1140   CL 103 12/16/2016 0348   CO2 26 04/20/2017 1140   GLUCOSE 85 04/20/2017 1140   BUN 12.1 04/20/2017 1140   CREATININE 0.7 04/20/2017 1140   CALCIUM 9.9 04/20/2017 1140   PROT 7.0 04/20/2017 1140   ALBUMIN 4.2 04/20/2017 1140   AST 14 04/20/2017 1140   ALT 12 04/20/2017 1140   ALKPHOS 42 04/20/2017 1140   BILITOT 0.44 04/20/2017 1140   GFRNONAA >60 12/15/2016 2314   GFRAA >60 12/15/2016 2314   Lab Results  Component Value Date   WBC 2.1 (L) 04/20/2017   NEUTROABS 1.0 (L) 04/20/2017   HGB 11.3 (L) 04/20/2017   HCT 32.8 (L) 04/20/2017  MCV 97.0 04/20/2017   PLT 161 04/20/2017    Imaging: [place] Clinician Interpretation: I have personally reviewed the CNS images as listed.  My interpretation, in the context of the patient's clinical presentation, is stable disease  CLINICAL DATA:  Addison disease. History of brain tumor resection oligodendroglioma  EXAM: MRI HEAD WITHOUT AND WITH CONTRAST  TECHNIQUE: Multiplanar, multiecho pulse sequences of the brain and surrounding structures were obtained without and with intravenous contrast.  CONTRAST:  39m  MULTIHANCE GADOBENATE DIMEGLUMINE 529 MG/ML IV SOLN  COMPARISON:  MRI head 12/16/2016  FINDINGS: Brain: Dynamic pituitary protocol was performed to evaluate the pituitary.  The pituitary is normal in size measuring 8.3 mm in height. Pituitary enhances homogeneously and normally without microadenoma. Infundibulum midline. No compression optic chiasm. Cavernous sinus normal bilaterally.  Postop resection of right frontal convexity tumor. Tumor bed is stable with evidence of chronic hemosiderin. T2 and FLAIR hyperintensity surrounding the resection site is stable. No enhancing tumor mass postcontrast administration. Ventricles nondilated. Negative for acute infarct.  Vascular: Normal arterial flow voids.  Skull and upper cervical spine: Right craniotomy  Sinuses/Orbits: Negative  Other: None  IMPRESSION: Normal pituitary  Stable postop changes of right frontal tumor resection   Electronically Signed   By: CFranchot GalloM.D.   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  We had a long discussion regarding pathology, tumor genetics, treatment options, and prognosis for low grade gliomas.     We strongly recommend continuing to follow with serial MRIs.  Her next study should be performed in ~1 month.    Screening for potential clinical trials was performed and discussed using eligibility criteria for [facility], as well as national database available on [email or web address]    The patient is not a candidate for a research protocol at this time due to stable disease.   Seizures will be managed by Dr. ADelice Leschfor the time being.  I  told her that I could see her for a dedicated epilepsy evaluation if desired, and that my recommendation could then be shared with Dr. ADelice Lesch  I recommended starting a detailed calendar of events that we could review in 1 month.  For now she will continue her current regimen, VPA and Gabapentin.   I did agree to restart her Marinol because of continued poor appetite.  She feels that Marinol also lessens her seizure volume.    We spent twenty additional minutes teaching regarding the natural history, biology, and historical experience in the treatment of brain tumors. We then discussed in detail the current recommendations for therapy focusing on the mode of administration, mechanism of action, anticipated toxicities, and quality of life issues associated with this plan. We also provided teaching sheets for the patient to take home as an additional resource.  She will return to clinic in 1 month with an MRI for evaluation.  All questions were answered. The patient knows to call the clinic with any problems, questions or concerns. No barriers to learning were detected.  I spent 60 minutes counseling the patient face to face. The total time spent in the appointment was 80 minutes and more than 50% was on counseling and review of test results  Ventura Sellers, MD Medical Director of Neuro-Oncology [facility] at [place] 04/21/17 11:13 AM

## 2017-04-21 NOTE — Assessment & Plan Note (Signed)
She has recurrent, uncontrolled seizures which is getting more frequent I am concerned about medical compliance The patient is very forgetful Recommend neuro-oncologist consultation I will not change any of her medications for now

## 2017-04-21 NOTE — Assessment & Plan Note (Signed)
I am concerned about her recurrent seizures The patient is very forgetful and I am wondering whether she is taking her medications correctly Recommend consultation with neuro-oncologist.  She agreed The patient would need to have repeat imaging study soon

## 2017-04-21 NOTE — Telephone Encounter (Signed)
Spoke with patient regarding her appts that were scheduled per 10/5 los. Did not want avs or calendar.

## 2017-04-24 ENCOUNTER — Telehealth: Payer: Self-pay | Admitting: *Deleted

## 2017-04-24 NOTE — Telephone Encounter (Signed)
Received denial for Marinol from [place].  Initiated Production designer, theatre/television/film.  Notified to allow 72 business hours for processing.    Left message for patient to advise that was escalated to appeal status.

## 2017-04-26 ENCOUNTER — Other Ambulatory Visit: Payer: Self-pay | Admitting: Hematology and Oncology

## 2017-04-26 MED FILL — LEVOCETIRIZINE 5 MG TABLET: 5 | 60 days supply | Qty: 60 | Fill #0

## 2017-04-27 ENCOUNTER — Other Ambulatory Visit: Payer: Self-pay | Admitting: Neurology

## 2017-04-27 DIAGNOSIS — G40109 Localization-related (focal) (partial) symptomatic epilepsy and epileptic syndromes with simple partial seizures, not intractable, without status epilepticus: Secondary | ICD-10-CM

## 2017-04-27 MED FILL — GABAPENTIN 300 MG CAPSULE: 300 | 90 days supply | Qty: 540 | Fill #0

## 2017-04-27 MED FILL — LORazepam 1 MG TABS: 1 | 30 days supply | Qty: 90 | Fill #0

## 2017-04-27 NOTE — Telephone Encounter (Signed)
Victoria Bates, can you call and ask if she needs other meds refill?

## 2017-04-27 NOTE — Telephone Encounter (Signed)
Called and left below message. Ask her to call nurse back. 

## 2017-04-27 NOTE — Telephone Encounter (Signed)
Patient called back and left message that she only needs the Ativan refilled.

## 2017-04-28 ENCOUNTER — Telehealth: Payer: Self-pay | Admitting: *Deleted

## 2017-04-28 MED FILL — DRONABINOL 10 MG CAPS: 10 | 30 days supply | Qty: 60 | Fill #0
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
## 2017-05-03 ENCOUNTER — Telehealth: Payer: Self-pay | Admitting: Interventional Cardiology

## 2017-05-03 ENCOUNTER — Telehealth: Payer: Self-pay | Admitting: *Deleted

## 2017-05-03 MED ORDER — METOPROLOL TARTRATE 25 MG PO TABS: 12.5000 mg | ORAL_TABLET | Freq: Two times a day (BID) | ORAL | 0 refills | Status: DC | PRN

## 2017-05-03 NOTE — Telephone Encounter (Signed)
Pt called she has a 3 cm grade II/IV Oligodendroglioma of the right frontal brain-has a really bad headache yesterday, dizziness, HR yesterday was 175 and 196 , BP 238/204 and 235/167  Right eye bloodshot and had petechiae and bruising around her knee, she took a nap and woke up with blood all on her comforter with no other visible signs of where it could have come from

## 2017-05-03 NOTE — Telephone Encounter (Signed)
Spoke with [employer]. Suggested they go to the ED.

## 2017-05-03 NOTE — Telephone Encounter (Signed)
Returned call to patient who states that she has a 3 cm grade II/IV Oligodendroglioma of the right frontal brain and that she had a really bad headache yesterday was dizzy and her HR was elevated 175-196 with BP 238/204 and 235/167. She also states that her right eye is blood shot and that she took a nap and woke up with blood on her comforter on the area of the comforter that she rolls up and places under her knees. She states that there was some bruising but she was unable to find the source of her bleeding and that she is not actively bleeding. Patient states that she is not sure of where the blood came from. She state that she has been having episodes of shaking. Patient states that today she felt her palpitations coming back and that she felt pressure in her chest below her xiphoid process so she went to the fire department. She states that they did an EKG on her for 1 hour and that it showed that she was having frequent PVCs. She states that her HR was in the 170s and got down to 113. She states that they checked her BP there and that it was 180/138 and 134/100. Patinet states that she took 2 ativan and started to feel better. Patient states that she does not want to go to the ER at this time. Patient's holter from 01/2017 showed NSR, ST, rare episodes of PACs and PVCs, no atrial fibrillation. Discussed with Dr. Irish Lack who states that we can try low dose metoprolol tartrate 12.5 mg BID PRN for palpitations. Rx sent to patient preferred pharmacy. Advised for the patient to go to the ER if she starts to feel worse. Advised for patient to also F/U with her neurologist and PCP. Patient verbalized understanding. Patient's husband is going to drop of EKG in the morning that was done at fire station for Dr. Irish Lack to review.

## 2017-05-04 ENCOUNTER — Encounter (HOSPITAL_COMMUNITY): Payer: Self-pay | Admitting: Emergency Medicine

## 2017-05-04 ENCOUNTER — Emergency Department (HOSPITAL_COMMUNITY): Payer: Medicare Other

## 2017-05-04 DIAGNOSIS — R0789 Other chest pain: Secondary | ICD-10-CM | POA: Diagnosis not present

## 2017-05-04 DIAGNOSIS — J45909 Unspecified asthma, uncomplicated: Secondary | ICD-10-CM | POA: Diagnosis not present

## 2017-05-04 DIAGNOSIS — R0602 Shortness of breath: Secondary | ICD-10-CM | POA: Diagnosis not present

## 2017-05-04 DIAGNOSIS — R079 Chest pain, unspecified: Secondary | ICD-10-CM | POA: Insufficient documentation

## 2017-05-04 DIAGNOSIS — Z91040 Latex allergy status: Secondary | ICD-10-CM | POA: Diagnosis not present

## 2017-05-04 DIAGNOSIS — Z79899 Other long term (current) drug therapy: Secondary | ICD-10-CM | POA: Diagnosis not present

## 2017-05-04 DIAGNOSIS — R51 Headache: Secondary | ICD-10-CM | POA: Insufficient documentation

## 2017-05-04 DIAGNOSIS — R404 Transient alteration of awareness: Secondary | ICD-10-CM | POA: Diagnosis not present

## 2017-05-04 DIAGNOSIS — R42 Dizziness and giddiness: Secondary | ICD-10-CM | POA: Diagnosis not present

## 2017-05-04 LAB — BASIC METABOLIC PANEL
Anion gap: 7 (ref 5–15)
BUN: 7 mg/dL (ref 6–20)
CO2: 23 mmol/L (ref 22–32)
Calcium: 8.9 mg/dL (ref 8.9–10.3)
Chloride: 105 mmol/L (ref 101–111)
Creatinine, Ser: 0.54 mg/dL (ref 0.44–1.00)
GFR calc Af Amer: >60 mL/min (ref >60)
GFR calc non Af Amer: >60 mL/min (ref >60)
Glucose, Bld: 98 mg/dL (ref 65–99)
Potassium: 3.7 mmol/L (ref 3.5–5.1)
Sodium: 135 mmol/L (ref 135–145)

## 2017-05-04 LAB — CBC
HCT: 36.8 % (ref 36.0–46.0)
Hemoglobin: 12.4 g/dL (ref 12.0–15.0)
MCH: 32.5 pg (ref 26.0–34.0)
MCHC: 33.7 g/dL (ref 30.0–36.0)
MCV: 96.6 fL (ref 78.0–100.0)
Platelets: 182 10*3/uL (ref 150–400)
RBC: 3.81 MIL/uL — ABNORMAL LOW (ref 3.87–5.11)
RDW: 11.5 % (ref 11.5–15.5)
WBC: 3.2 10*3/uL — ABNORMAL LOW (ref 4.0–10.5)

## 2017-05-04 LAB — I-STAT TROPONIN, ED: Troponin i, poc: 0 ng/mL (ref 0.00–0.08)

## 2017-05-04 NOTE — ED Triage Notes (Signed)
Pt reports having a seizure on Monday and a severe Migraine Tuesday. Chest pain and SOB began yesterday evening. Reports chest pain that is in central chest and across to her armpits and down her center abd. Endorses dizziness, nausea and diarrhea since yesterday.

## 2017-05-05 ENCOUNTER — Emergency Department (HOSPITAL_COMMUNITY): Payer: Medicare Other

## 2017-05-05 ENCOUNTER — Telehealth: Payer: Self-pay

## 2017-05-05 ENCOUNTER — Other Ambulatory Visit: Payer: Self-pay | Admitting: Hematology and Oncology

## 2017-05-05 ENCOUNTER — Emergency Department (HOSPITAL_COMMUNITY)
Admission: EM | Admit: 2017-05-05 | Discharge: 2017-05-05 | Disposition: A | Discharge status: Home / Self Care | Payer: Medicare Other | Attending: Emergency Medicine | Admitting: Emergency Medicine

## 2017-05-05 DIAGNOSIS — R42 Dizziness and giddiness: Secondary | ICD-10-CM | POA: Diagnosis not present

## 2017-05-05 DIAGNOSIS — R079 Chest pain, unspecified: Secondary | ICD-10-CM

## 2017-05-05 DIAGNOSIS — R51 Headache: Secondary | ICD-10-CM | POA: Diagnosis not present

## 2017-05-05 DIAGNOSIS — R519 Headache, unspecified: Secondary | ICD-10-CM

## 2017-05-05 LAB — I-STAT TROPONIN, ED: Troponin i, poc: 0 ng/mL (ref 0.00–0.08)

## 2017-05-05 MED ORDER — HYDROCODONE-ACETAMINOPHEN 10-325 MG PO TABS: 1.0000 | ORAL_TABLET | ORAL | 0 refills | Status: DC | PRN

## 2017-05-05 MED ORDER — BENZONATATE 100 MG PO CAPS
200.0000 mg | ORAL_CAPSULE | Freq: Once | ORAL | Status: AC
  Administered 2017-05-05: 200 mg via ORAL
  Filled 2017-05-05: qty 2

## 2017-05-05 MED ORDER — HEPARIN SOD (PORK) LOCK FLUSH 100 UNIT/ML IV SOLN
500.0000 [IU] | Freq: Once | INTRAVENOUS | Status: AC
  Administered 2017-05-05: 500 [IU]
  Filled 2017-05-05: qty 5

## 2017-05-05 MED ORDER — PROMETHAZINE HCL 25 MG/ML IJ SOLN
25.0000 mg | Freq: Once | INTRAMUSCULAR | Status: AC
Start: 2017-05-05 — End: 2017-05-05
  Administered 2017-05-05: 25 mg via INTRAVENOUS
  Filled 2017-05-05: qty 1

## 2017-05-05 MED ORDER — SODIUM CHLORIDE 0.9 % IV BOLUS (SEPSIS)
1000.0000 mL | Freq: Once | INTRAVENOUS | Status: AC
  Administered 2017-05-05: 1000 mL via INTRAVENOUS

## 2017-05-05 MED ORDER — ONDANSETRON HCL 4 MG/2ML IJ SOLN: 4.0000 mg | Freq: Four times a day (QID) | INTRAMUSCULAR | Status: DC | PRN

## 2017-05-05 MED ORDER — IOPAMIDOL (ISOVUE-370) INJECTION 76%
INTRAVENOUS | Status: AC
  Administered 2017-05-05: 100 mL
  Filled 2017-05-05: qty 100

## 2017-05-05 MED ORDER — BENZONATATE 100 MG PO CAPS: 200.0000 mg | ORAL_CAPSULE | Freq: Three times a day (TID) | ORAL | 0 refills | Status: DC

## 2017-05-05 MED ORDER — KETOROLAC TROMETHAMINE 15 MG/ML IJ SOLN
15.0000 mg | Freq: Once | INTRAMUSCULAR | Status: AC
  Administered 2017-05-05: 15 mg via INTRAVENOUS
  Filled 2017-05-05: qty 1

## 2017-05-05 MED ORDER — METOCLOPRAMIDE HCL 5 MG/ML IJ SOLN
10.0000 mg | Freq: Once | INTRAMUSCULAR | Status: AC
  Administered 2017-05-05: 10 mg via INTRAVENOUS
  Filled 2017-05-05: qty 2

## 2017-05-05 MED FILL — HYDROCODON-APAP 10-325: 10-325 | 15 days supply | Qty: 90 | Fill #0

## 2017-05-05 NOTE — Discharge Instructions (Signed)
°  All the results in the ER are normal, labs and imaging. We are not sure what is causing your symptoms. The workup in the ER is not complete, and is limited to screening for life threatening and emergent conditions only, so please see a primary care doctor for further evaluation.  Please return to the ER if the headache gets severe and in not improving, you have associated new one sided numbness, tingling, weakness or confusion, seizures, poor balance or poor vision.  Please return to the Er if the chest pain gets worse or you have difficulty in breathing.

## 2017-05-05 NOTE — ED Notes (Signed)
Patient currently at CT scan .  

## 2017-05-05 NOTE — Telephone Encounter (Signed)
Follow up   Patient calling states she went to [facility] ED on 10/18. Patient is requesting  Dr Irish Lack review studies from ED.  Concerned about  CT scan and questionable blood clot and  what amount of discomfort is normal.  Pt c/o of Chest Pain: STAT if CP now or developed within 24 hours  1. Are you having CP right now? yes  2. Are you experiencing any other symptoms (ex. SOB, nausea, vomiting, sweating)? nausea  3. How long have you been experiencing CP? Went to ED on 10/18 discharged about 7 am today.  4. Is your CP continuous or coming and going? Continuous, but gets worse  When talking or walking  5. Have you taken Nitroglycerin? NO ?

## 2017-05-05 NOTE — Telephone Encounter (Signed)
Spoke with patient who was evaluated in the ED recently.  She is reporting waking with a severe H/A on Tuesday after having had a possible seizure on Monday.  She does report falling and hitting her head during this seizure.  Wednesday AM she woke from her sleep about 4 am with chest pain/pressure, SOB and nausea/vomitting.  This is when she went to the hospital. She had a CT of her head, CT of chest, labs etc.  Was told to f/u with PCP/oncologist closely.  She is calling here because of the chest pain.  Advised of normal troponin.  She is concerned because of the continued chest pain/pressure and SOB with the results listed below:  No gross central pulmonary artery filling defects are demonstrated, suggesting no evidence of large pulmonary embolus. However, poor contrast bolus limits examination and small or nonobstructing embolus cannot be entirely excluded.use of the chest pain.  Advised of normal troponin.    She would like to know how to proceed.  Advised I will forward this information to Dr Irish Lack for review however in the meanwhile, if she develops more/worsening chest pain and SOB she should report to the ED for further evaluation and treatment.  Pt states understanding and will await a return call.

## 2017-05-05 NOTE — Telephone Encounter (Signed)
I am not her PCP; I have directed the question regarding imaging scan to Dr. Sherrilee Gilles, her neuro-oncologist

## 2017-05-05 NOTE — Telephone Encounter (Signed)
That CT scan result is ok.  Negative troponin reassuring.  Chest pain does not appear to be from her heart or a blood clot.  Agree with ED precautions.

## 2017-05-05 NOTE — Telephone Encounter (Signed)
Husband called for hydrocodone refill. Needed today please.

## 2017-05-05 NOTE — Telephone Encounter (Signed)
S/w [name] that rx was ready for pickup. [name] asked if Dr Alvy Bimler was aware pt was in ED for headaches and chest pain and blurry vision. That ED said for strict follow up in the next day or so.

## 2017-05-05 NOTE — ED Notes (Signed)
Patient transported to CT scan . 

## 2017-05-05 NOTE — ED Provider Notes (Signed)
[facility] EMERGENCY DEPARTMENT Provider Note   CSN: [id number] Arrival date & time: 05/04/17  1816     History   Chief Complaint Chief Complaint  Patient presents with  . Chest Pain    HPI Victoria Bates is a 36 y.o. female.  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  Patient has history of chronic headaches, however she reports that the current headache is more severe.  She reports that the headache is on the right side of her head and behind her eye, and the location behind the eyes is not typical.  Headache started 3 days ago, and she had a seizure prior to her headache.  Patient reports some floaters over her right eye, otherwise she has no blurry vision, loss of vision, double vision.  The patient also denies any focal numbness, weakness.  Patient's headache is better when she leans forward.  The headache radiates towards her occiput.  Patient has taken her home meds for headache with no significant relief.  Currently the headache is rated at 6 out of 10.  Patient is also having constant chest pain.  Chest pain is located in the midsternal region.  Chest pain is described as sharp pain that is worse when laying down and better when she is sitting up.  Pain is also described as heaviness and patient feels short of breath.  patient has associated cough that is nonproductive.  Patient denies any associated fevers or chills.  No history of PE, DVT.  Patient has no myalgias, diaphoresis.  Past Medical History:  Diagnosis Date  . Anemia   . Anxiety   . Asthma    very mild- no inhaler use since July 2013  . Brain tumor ([place])    Hx: of  . Cancer ([place]) 2003   skin  . Chronic fatigue 10/03/2014  . Cognitive impairment 08/13/2015  . Complication of anesthesia    takes longer for patient to go under anesthesia  .  Dizziness   . Dysphasia 02/12/2014  . Glioma, malignant ([place]) dx'd 09/2013  . Headache 02/12/2014  . Headache(784.0)    migraines  . Herniated disc   . Hot flashes 10/03/2014  . Infection    to right head incision  . 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 Liver hemangioma   . Liver lesion 08/22/2014  . Nausea alone 02/12/2014  . Neuropathic pain 04/08/2014  . Petit mal seizure status ([place])   . Seizures ([place])    petit mal serizures most days    Patient Active Problem List   Diagnosis Date Noted  . Orthostatic dizziness 01/24/2017  . Addison disease ([place]) 01/16/2017  . Anxiety 01/16/2017  . 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 Goals of care, counseling/discussion 11/20/2016  . Other pancytopenia ([place]) 10/10/2016  . Deficiency anemia 10/10/2016  . Diverticulitis 08/22/2016  . Upper airway cough syndrome 01/28/2016  . Cough variant asthma 01/28/2016  . Dyspnea 01/28/2016  . Drug-induced neutropenia ([place]) 10/12/2015  . 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 Constipation, acute 10/12/2015  . Cognitive impairment 08/13/2015  . Dysphagia, idiopathic 04/30/2015  . Low serum cortisol level ([place]) 10/20/2014  . 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 Astrocytoma ([place]) 10/17/2014  . Chronic fatigue 10/03/2014  . Hot flashes 10/03/2014  .  Liver lesion 08/22/2014  . Pancytopenia due to antineoplastic chemotherapy ([place]) 06/23/2014  . Nausea & vomiting 06/23/2014  . Genetic testing 06/13/2014  . Intractable chronic cluster headache 04/28/2014  . Thrombocytopenia ([place]) 04/28/2014  . Neuropathic pain 04/08/2014  . Simple partial seizures ([place]) 03/20/2014  . Sensory seizure ([place]) 03/20/2014  . Nausea without vomiting 02/12/2014  . Dysphasia 02/12/2014  . Headache 02/12/2014  . Seizures ([place]) 02/12/2014  . Financial difficulty 02/12/2014  . CIN I  (cervical intraepithelial neoplasia I) 02/12/2014  . Anemia, unspecified 02/12/2014  . Brain tumor, astrocytoma ([place]) 01/23/2014  . 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 Paresthesias 09/20/2013  . Blurred vision, left eye 09/20/2013  . Asthma 07/24/2009  . CHEST PAIN 07/24/2009    Past Surgical History:  Procedure Laterality Date  . ABDOMINAL HYSTERECTOMY    . CESAREAN SECTION    . COLONOSCOPY WITH PROPOFOL N/A 08/29/2012   Procedure: COLONOSCOPY WITH PROPOFOL;  Surgeon: Arta Silence, MD;  Location: [facility];  Service: Endoscopy;  Laterality: N/A;  . coposcopy  08/06/2012   cervical biopsy  . CRANIOTOMY Right 09/26/2013   Procedure: Awake Right frontal open brain biopsy;  Surgeon: Winfield Cunas, MD;  Location: [facility];  Service: Neurosurgery;  Laterality: Right;  awake Right frontal open brain biopsy  . CRANIOTOMY N/A 10/30/2013   Procedure: debridement of cranial wound  right temporal incision, ;  Surgeon: Winfield Cunas, MD;  Location: [facility];  Service: Neurosurgery;  Laterality: N/A;  . CRANIOTOMY N/A 01/23/2014   Procedure: Awake Craniotomy with BrainLab;  Surgeon: Winfield Cunas, MD;  Location: [facility];  Service: Neurosurgery;  Laterality: N/A;  Awake Craniotomy  tumor with BrainLab  . ESOPHAGOGASTRODUODENOSCOPY (EGD) WITH PROPOFOL N/A 08/29/2012   Procedure: ESOPHAGOGASTRODUODENOSCOPY (EGD) WITH PROPOFOL;  Surgeon: Arta Silence, MD;  Location: [facility];  Service: Endoscopy;  Laterality: N/A;  . left knee surgery  2000   torn meniscus  . right flank surgery  2005   bnign mass from flank area-right  . ROBOTIC ASSISTED TOTAL HYSTERECTOMY WITH BILATERAL SALPINGO OOPHERECTOMY Bilateral 09/25/2012   Procedure: ROBOTIC ASSISTED TOTAL HYSTERECTOMY WITH BILATERAL SALPINGO OOPHORECTOMY;  Surgeon: Maeola Sarah. Landry Mellow, MD;  Location: [facility];  Service: Gynecology;  Laterality: Bilateral;  . ROTATOR  CUFF REPAIR    . WISDOM TOOTH EXTRACTION      OB History    No data available       Home Medications    Prior to Admission medications   Medication Sig Start Date End Date Taking? Authorizing Provider  albuterol (PROVENTIL HFA;VENTOLIN HFA) 108 (90 Base) MCG/ACT inhaler Inhale 1-2 puffs into the lungs every 6 (six) hours as needed for wheezing or shortness of breath.   Yes [provider]  amphetamine-dextroamphetamine (ADDERALL XR) 10 MG 24 hr capsule Take 10 mg by mouth daily.  03/31/17  Yes [provider]  butalbital-acetaminophen-caffeine (FIORICET, ESGIC) 50-325-40 MG tablet TAKE 1 TABLET BY MOUTH EVERY 6 HOURS AS NEEDED FOR HEADACHE 04/20/17  Yes Gorsuch, Ni, MD  carisoprodol (SOMA) 350 MG tablet TAKE 1 TABLET BY MOUTH 4 TIMES DAILY FOR SEIZURE PREVENTION 04/18/17  Yes Gorsuch, Ni, MD  Cholecalciferol (VITAMIN D) 2000 units CAPS Take 2,000 Units by mouth daily.   Yes [provider]  Cyanocobalamin (VITAMIN B-12 PO) Take 2,500 Units by mouth daily.   Yes [provider]  divalproex (DEPAKOTE ER) 250 MG 24 hr tablet Take  500 mg by mouth 2 (two) times daily.   Yes [provider]  dronabinol (MARINOL) 10 MG capsule Take 10 mg by mouth daily.  02/16/17  Yes [provider]  gabapentin (NEURONTIN) 300 MG capsule TAKE 3 CAPSULES BY MOUTH IN THE MORNING AND 3 CAPSULES IN THE EVENING 04/27/17  Yes Cameron Sprang, MD  HYDROcodone-acetaminophen (NORCO) 10-325 MG tablet Take 1 tablet by mouth every 4 (four) hours as needed. Patient taking differently: Take 1 tablet by mouth every 4 (four) hours as needed for moderate pain.  04/20/17  Yes Gorsuch, Ni, MD  hydrocortisone (CORTEF) 10 MG tablet Take 10 mg by mouth See admin instructions. 1 tablet between 6-8am, 1/2 tablet at 11am, 1/2 tablet between 2-4pm daily, doubling dose on sick days    Yes [provider]  levocetirizine (XYZAL) 5 MG tablet Take 1 tablet (5 mg total) by mouth every  evening. 04/20/17  Yes Gorsuch, Ni, MD  LORazepam (ATIVAN) 1 MG tablet TAKE 1 TABLET BY MOUTH EVERY 8 HOURS AS NEEDED FOR ANXIETY OR SEIZURE 04/27/17  Yes Gorsuch, Ni, MD  zolmitriptan (ZOMIG) 5 MG tablet Take 5 mg by mouth as needed for migraine.   Yes [provider]  benzonatate (TESSALON) 100 MG capsule Take 2 capsules (200 mg total) by mouth every 8 (eight) hours. 05/05/17   Varney Biles, MD  metoprolol tartrate (LOPRESSOR) 25 MG tablet Take 0.5 tablets (12.5 mg total) by mouth 2 (two) times daily as needed (Palpitations). 05/03/17 08/01/17  Jettie Booze, MD  montelukast (SINGULAIR) 10 MG tablet TAKE 1 TABLET BY MOUTH AT BEDTIME Patient not taking: Reported on 05/05/2017 09/08/16   Bobbitt, [place] Muta, MD  prochlorperazine (COMPAZINE) 10 MG tablet Take 1 tablet (10 mg total) by mouth every 6 (six) hours as needed for nausea or vomiting. Patient not taking: Reported on 04/21/2017 12/19/16   Heath Lark, MD    Family History Family History  Problem Relation Age of Onset  . Hypertension Mother   . Skin cancer Mother   . Thyroid cancer Father   . Skin cancer Father   . Breast cancer Maternal Aunt        dx in her 23s  . Prostate cancer Maternal Uncle 63  . Melanoma Maternal Uncle   . Ovarian cancer Maternal Grandmother        dx in her late 37s-40s  . Breast cancer Maternal Aunt        bilateral breast cancer dx in 29s and 20s; possibly BRCA+  . Other Son        multiple CAL spots, bright spots on brain MRI, ? chiari malformation    Social History Social History  Substance Use Topics  . Smoking status: Never Smoker  . Smokeless tobacco: Never Used  . Alcohol use No     Allergies   Adhesive [tape]; Latex; Percocet [oxycodone-acetaminophen]; Shellfish allergy; Skin adhesives; Soma [carisoprodol]; Tramadol hcl; Vancomycin; and Zofran [ondansetron hcl]   Review of Systems Review of Systems  Constitutional: Positive for activity change.  Respiratory: Positive  for shortness of breath. Negative for wheezing.   Cardiovascular: Positive for chest pain.  Neurological: Positive for headaches.  All other systems reviewed and are negative.    Physical Exam Updated Vital Signs BP (!) 164/98 (BP Location: Left Arm)   Pulse 92   Temp 98.2 F (36.8 C) (Oral)   Resp 18   LMP 09/20/2012   SpO2 96%   Physical Exam  Constitutional: She is oriented  to person, place, and time. She appears well-developed.  HENT:  Head: Normocephalic and atraumatic.  Eyes: Pupils are equal, round, and reactive to light. Conjunctivae and EOM are normal.  Bedside for endoscopy reveals no retinal hemorrhage  Neck: Normal range of motion. Neck supple.  Cardiovascular:  tachycardia  Pulmonary/Chest: Effort normal and breath sounds normal. She has no wheezes.  Abdominal: Bowel sounds are normal.  Neurological: She is alert and oriented to person, place, and time. No cranial nerve deficit. Coordination normal.  Skin: Skin is warm and dry.  Nursing note and vitals reviewed.    ED Treatments / Results  Labs (all labs ordered are listed, but only abnormal results are displayed) Labs Reviewed  CBC - Abnormal; Notable for the following:       Result Value   WBC 3.2 (*)    RBC 3.81 (*)    All other components within normal limits  BASIC METABOLIC PANEL  I-STAT TROPONIN, ED  I-STAT TROPONIN, ED    EKG  EKG Interpretation  Date/Time:  Thursday May 04 2017 18:23:52 EDT Ventricular Rate:  106 PR Interval:  124 QRS Duration: 86 QT Interval:  356 QTC Calculation: 472 R Axis:   74 Text Interpretation:  Sinus tachycardia T wave abnormality, consider inferior ischemia Abnormal ECG Artifact When compared with ECG of 12/15/2016, T wave abnormality is now present Confirmed by Delora Fuel ([id number]) on 05/04/2017 11:34:29 PM       Radiology Dg Chest 2 View  Result Date: 05/04/2017 CLINICAL DATA:  Chest pain and shortness breath beginning yesterday. Dizziness and  nausea. Brain tumor. EXAM: CHEST  2 VIEW COMPARISON:  01/28/2016 FINDINGS: The heart size and mediastinal contours are within normal limits. Both lungs are clear. Right-sided power port remains in appropriate position. Stable mild thoracic dextroscoliosis. IMPRESSION: Stable exam.  No active cardiopulmonary disease. Electronically Signed   By: Earle Gell M.D.   On: 05/04/2017 19:28   Ct Head Wo Contrast  Result Date: 05/05/2017 CLINICAL DATA:  36 y/o F; headache and dizziness. History of seizures and brain tumor. EXAM: CT HEAD WITHOUT CONTRAST TECHNIQUE: Contiguous axial images were obtained from the base of the skull through the vertex without intravenous contrast. COMPARISON:  02/04/2017 MRI of the head.  12/15/2016 CT of the head. FINDINGS: Brain: No evidence of acute infarction, hemorrhage, hydrocephalus, extra-axial collection or mass lesion/mass effect. Stable right frontal resection cavity. Vascular: No hyperdense vessel or unexpected calcification. Skull: Right frontotemporal craniotomy changes are stable. Sinuses/Orbits: No acute finding. Other: None. IMPRESSION: 1. No acute intracranial abnormality. 2. Stable postsurgical changes related to right frontal craniotomy and right frontal lobe resection cavity. Electronically Signed   By: Kristine Garbe M.D.   On: 05/05/2017 04:37   Ct Angio Chest Pe W And/or Wo Contrast  Result Date: 05/05/2017 CLINICAL DATA:  Patient fell Monday evening. Headache since then. Dizziness. History of brain tumor in seizures. Sharp right chest pain above the port. Shortness of breath and cough. EXAM: CT ANGIOGRAPHY CHEST WITH CONTRAST TECHNIQUE: Multidetector CT imaging of the chest was performed using the standard protocol during bolus administration of intravenous contrast. Multiplanar CT image reconstructions and MIPs were obtained to evaluate the vascular anatomy. CONTRAST:  40 mL Isovue 370 COMPARISON:  08/30/2015 FINDINGS: Cardiovascular: Poor contrast  bolus limits examination. There is moderate visualization of the larger central pulmonary arteries and no gross filling defects are identified. Smaller peripheral or nonobstructing emboli could be obscured. Normal heart size. No pericardial effusion. Normal caliber thoracic aorta. No  evidence of aortic dissection. Mediastinum/Nodes: Central venous catheter with tip in the low SVC. Esophagus is decompressed. No significant lymphadenopathy in the chest. Lungs/Pleura: Lungs are clear and expanded. No pleural effusions. No pneumothorax. Upper Abdomen: No acute process demonstrated in the visualized upper abdominal organs. Musculoskeletal: No chest wall abnormality. No acute or significant osseous findings. Review of the MIP images confirms the above findings. IMPRESSION: 1. No gross central pulmonary artery filling defects are demonstrated, suggesting no evidence of large pulmonary embolus. However, poor contrast bolus limits examination and small or nonobstructing embolus cannot be entirely excluded. 2. No evidence of active pulmonary disease. Electronically Signed   By: Lucienne Capers M.D.   On: 05/05/2017 04:37    Procedures Procedures (including critical care time)  Medications Ordered in ED Medications  ondansetron (ZOFRAN) injection 4 mg (4 mg Intravenous Refused 05/05/17 0140)  sodium chloride 0.9 % bolus 1,000 mL (0 mLs Intravenous Stopped 05/05/17 0259)  promethazine (PHENERGAN) injection 25 mg (25 mg Intravenous Given 05/05/17 0148)  iopamidol (ISOVUE-370) 76 % injection (100 mLs  Contrast Given 05/05/17 0352)  ketorolac (TORADOL) 15 MG/ML injection 15 mg (15 mg Intravenous Given 05/05/17 0634)  metoCLOPramide (REGLAN) injection 10 mg (10 mg Intravenous Given 05/05/17 0634)  benzonatate (TESSALON) capsule 200 mg (200 mg Oral Given 05/05/17 8250)  heparin lock flush 100 unit/mL (500 Units Intracatheter Given 05/05/17 0654)     Initial Impression / Assessment and Plan / ED Course  I have  reviewed the triage vital signs and the nursing notes.  Pertinent labs & imaging results that were available during my care of the patient were reviewed by me and considered in my medical decision making (see chart for details).    Patient comes in with chief complaint of headaches and chest pain.  Patient has history of oligo astrocytoma of the brain.  Patient has a port on the right side of her chest.  Chest pain appears to be positional, and differential diagnosis includes pericarditis, PE and ACS. EKG shows no signs of pericarditis, and the exam does not reveal apparent friction rub.  EKG does show however that patient has S1 every 3 T3 pattern, given the history of tumor and tachycarida we will get a CT PE.  Initial EKG and cardiac enzyme do not show any acute concerns.  Patient has history of chronic headaches, and the current headache has many of the typical features.  She does however have new pain behind her right eye.  Patient has associated nausea and she thinks that her headache is better when she is sitting upright.  Given the history of brain tumor but main concern are for worsening of the tumor, cerebral edema, brain bleed.  Ocular exam does not reveal any retinal hemorrhage.  Patient has no meningismus and without any fevers I am not concerned about an infectious etiology.  We will get a CT scan of the head.  We also considered dural venous thrombosis in her differential diagnosis, however given that the headache is not severe and is intermittently present for the last several years it would be better to treat the headache as recurrent migraine/cluster headache, and have patient's neurologist decide if MRI is needed.  I discussed the results from the emergency department workup and the pathologist that have been ruled out to the patient.  She is satisfied with the workup done in the emergency department.  I still informed her that given her history I am concerned about her headache and  the chest pain  and I have emailed Dr. Alvy Bimler about patient's visit to the emergency room.  I discussed at length with strict emergency room return precautions with the patient and her husband.  Final Clinical Impressions(s) / ED Diagnoses   Final diagnoses:  Nonspecific chest pain  Severe headache    New Prescriptions Discharge Medication List as of 05/05/2017  6:28 AM       Varney Biles, MD 05/05/17 [phone]

## 2017-05-05 NOTE — Telephone Encounter (Signed)
ready

## 2017-05-09 ENCOUNTER — Encounter: Payer: Self-pay | Admitting: Interventional Cardiology

## 2017-05-09 NOTE — Telephone Encounter (Signed)
Called and spoke to patient and she states that she has been having trouble sleeping and that she has had elevated HRs in the low 100s. Patient states that she has been taking the metoprolol 12.5 mg BID. She states that she was recently in the hospital and was concerned about the CT scan that she had done. Made patient aware that per Dr. Irish Lack, her CT scan is okay and that with a negative troponin is reassuring. Made patient aware that her CP did not appear to be from her heart or a blood clot. Patient was advised to follow up with her oncologist and neurologist to see if an MRI of her brain was necessary. Patient states that the MRI is scheduled for 11/2. ER precautions reviewed with the patient and she verbalizes understanding and thanked me for the call.

## 2017-05-09 NOTE — Telephone Encounter (Signed)
Victoria Bates is calling (DPR on file) and asking that I call the patient. He states that she is still having trouble sleeping and elevated HRs.

## 2017-05-12 DIAGNOSIS — R51 Headache: Secondary | ICD-10-CM | POA: Diagnosis not present

## 2017-05-12 DIAGNOSIS — E279 Disorder of adrenal gland, unspecified: Secondary | ICD-10-CM | POA: Diagnosis not present

## 2017-05-12 DIAGNOSIS — F419 Anxiety disorder, unspecified: Secondary | ICD-10-CM | POA: Diagnosis not present

## 2017-05-12 DIAGNOSIS — Z85841 Personal history of malignant neoplasm of brain: Secondary | ICD-10-CM | POA: Diagnosis not present

## 2017-05-12 DIAGNOSIS — E278 Other specified disorders of adrenal gland: Secondary | ICD-10-CM | POA: Diagnosis not present

## 2017-05-12 DIAGNOSIS — R911 Solitary pulmonary nodule: Secondary | ICD-10-CM | POA: Diagnosis not present

## 2017-05-12 DIAGNOSIS — R000 Tachycardia, unspecified: Secondary | ICD-10-CM | POA: Diagnosis not present

## 2017-05-12 DIAGNOSIS — R072 Precordial pain: Secondary | ICD-10-CM | POA: Diagnosis not present

## 2017-05-13 DIAGNOSIS — R51 Headache: Secondary | ICD-10-CM | POA: Diagnosis not present

## 2017-05-13 DIAGNOSIS — C719 Malignant neoplasm of brain, unspecified: Secondary | ICD-10-CM | POA: Diagnosis not present

## 2017-05-16 ENCOUNTER — Other Ambulatory Visit: Payer: Self-pay | Admitting: Hematology and Oncology

## 2017-05-16 ENCOUNTER — Telehealth: Payer: Self-pay | Admitting: *Deleted

## 2017-05-16 MED FILL — BUTALB-ACETAMIN-CAFF 50-325: 50-325-40 | 23 days supply | Qty: 90 | Fill #0

## 2017-05-16 NOTE — Telephone Encounter (Signed)
LM reminding patient of appt on Friday for MRI and then Tuesday with Dr Mickeal Skinner

## 2017-05-16 NOTE — Telephone Encounter (Signed)
pls see if she needs anything else

## 2017-05-18 ENCOUNTER — Telehealth: Payer: Self-pay | Admitting: Interventional Cardiology

## 2017-05-18 ENCOUNTER — Telehealth: Payer: Self-pay | Admitting: *Deleted

## 2017-05-18 MED ORDER — HYDROCODONE-ACETAMINOPHEN 10-325 MG PO TABS: 1.0000 | ORAL_TABLET | ORAL | 0 refills | Status: DC | PRN

## 2017-05-18 MED FILL — HYDROCODON-APAP 10-325: 10-325 | 15 days supply | Qty: 90 | Fill #0

## 2017-05-18 NOTE — Telephone Encounter (Signed)
Victoria Bates is calling because she is wanting Dr.Varanasi to look over the tests  that she had on last week to see if this could be a spasm and is there anything she can do to prevent them . Please call if you jhave any questions . Thanks

## 2017-05-18 NOTE — Telephone Encounter (Signed)
Phone call lasted 49 minutes:  Returned call to patient who states that she was seen at [facility] on 10/26 and they ran a bunch of tests (echo, CTA chest, etc.) she would like for Dr. Irish Lack to review to see if he has any other recommendations and to see if his opinion changed about her symptoms related to her heart. Patient wanting to know if she could be having vasospasms and if there is something that she can take to treat it. Made patient aware that I would forward the information to Dr. Irish Lack. Also made the patient aware that so far everything that we have reviewed have not indicated a problem with her heart. We have advised her to follow up with her neurologist/oncologist. Patient states that her oncologists is telling her to follow up with cardiology. Patient is crying on the phone stating that she just wants some answers and that she wants to feel better and be able to function and be there for her kids. Made patient aware that I would speak to Dr. Irish Lack and call her back Monday evening. Patient verbalized understanding and thanked me for being willing to listen to her concerns.

## 2017-05-18 NOTE — Telephone Encounter (Signed)
Pt left message that [facility] had given her the wrong brand of Soma. RN called [facility] and gave them Dr Alvy Bimler message that [facility] for early refill as long as patient returns unused Soma to [facility] for disposal.  Also requested refill of hydrocodone.   Notified patient and husband to return unused Soma to [facility] for disposal.

## 2017-05-19 ENCOUNTER — Other Ambulatory Visit: Payer: Self-pay | Admitting: Radiation Therapy

## 2017-05-19 ENCOUNTER — Ambulatory Visit (HOSPITAL_COMMUNITY): Admission: RE | Admit: 2017-05-19 | Payer: Medicare Other | Source: Ambulatory Visit

## 2017-05-19 ENCOUNTER — Ambulatory Visit
Admission: RE | Admit: 2017-05-19 | Discharge: 2017-05-19 | Disposition: A | Discharge status: Home / Self Care | Payer: Self-pay | Source: Ambulatory Visit | Attending: Internal Medicine | Admitting: Internal Medicine

## 2017-05-19 DIAGNOSIS — C719 Malignant neoplasm of brain, unspecified: Secondary | ICD-10-CM

## 2017-05-22 NOTE — Telephone Encounter (Signed)
Called and made patient aware that Dr. Irish Lack has reviewed her tests performed at [facility] on 10/26. Made patient aware that he still feels that the tests performed do not indicate a problem with her heart. Patient verbalized understanding and states that she has an appointment with her neuro/oncologist tomorrow.

## 2017-05-23 ENCOUNTER — Telehealth: Payer: Self-pay | Admitting: Neurology

## 2017-05-23 ENCOUNTER — Encounter: Payer: Self-pay | Admitting: Internal Medicine

## 2017-05-23 ENCOUNTER — Ambulatory Visit (HOSPITAL_BASED_OUTPATIENT_CLINIC_OR_DEPARTMENT_OTHER): Payer: Medicare Other | Admitting: Internal Medicine

## 2017-05-23 ENCOUNTER — Ambulatory Visit: Payer: Medicare Other | Admitting: Internal Medicine

## 2017-05-23 VITALS — BP 140/86 | HR 126 | Temp 97.9°F | Resp 20 | Ht 69.0 in | Wt 142.3 lb

## 2017-05-23 DIAGNOSIS — R51 Headache: Secondary | ICD-10-CM | POA: Diagnosis not present

## 2017-05-23 DIAGNOSIS — C711 Malignant neoplasm of frontal lobe: Secondary | ICD-10-CM

## 2017-05-23 NOTE — Telephone Encounter (Signed)
Agree with Dr. Delice Lesch, recommend tertiary care center.

## 2017-05-23 NOTE — Telephone Encounter (Signed)
Patient called needing to see if Dr. Delice Lesch treated or was familiar with Autonomic Neuropathy? She is scheduled to come in on 06/05/17. She was told by her PCP to ask. Please Call. Thanks

## 2017-05-23 NOTE — Telephone Encounter (Signed)
Pls let her know I do not treat Autonomic Neuropathy, I usually send these patients to [facility] clinic. Thanks

## 2017-05-23 NOTE — Telephone Encounter (Signed)
This is Dr. Amparo Bristol patient.

## 2017-05-23 NOTE — Telephone Encounter (Signed)
Spoke with pt relaying message below.  Pt asked if there was another provider in the office who would see her for this as she refuses to go back to [facility] for anything.  Asked Su Hoff, LPN if Dr. Posey Pronto would be able to see her, per Caryl Pina Dr. Posey Pronto would need to review notes first before making a decision.

## 2017-05-23 NOTE — Progress Notes (Signed)
[facility] at [address] [place],  [address] [phone]   Interval Evaluation  Date of Service: 05/23/17 Patient Name: Victoria Bates Patient MRN: [id number] Patient DOB: 04-Jan-1981 Provider: Ventura Sellers, MD  Identifying Statement:  Victoria Bates is a 36 y.o. 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      09/27/2013 Pathology Results    Accession: [id number] pathology showed grade 2 oligodendroglioma with mutant IDH1 protein, ATRX, OLIG 2 expression. p53 is negative, low Ki-67 labeling index. 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      11/06/2013 Imaging    Repeat CT scan of the head showed resolution of abscess.      01/23/2014 Surgery    Dr. Christella Noa perform a subtotal gross resection of the tumor.      01/24/2014 Imaging    Repeat imaging studies showed persistent and residual disease.      03/03/2014 - 09/01/2014 Chemotherapy    She was started on Temodar, 5 days every 28 days      03/13/2014 Imaging    Repeat MRI showed no evidence of progression of disease.      07/14/2014 Imaging    Repeat MRI showed no evidence of disease progression.      09/12/2014 Imaging    MRI of the liver show mild regression of the size of lesions, presumed to be benign      09/15/2014 Imaging    Repeat MRI of the brain show persistent abnormalities, suspicious for residual disease      03/02/2015 Imaging     MRI brain at [facility] show persistent abnormalities      08/10/2015 Imaging    Repeat MRI [facility] showed stable disease      08/24/2015 - 02/08/2016 Chemotherapy    She was started on Lomustine, Procarbazine and Vincristine chemotherapy      08/30/2015 Imaging    CT chest showed no evidence of aspiration pneumonia      11/18/2015 Imaging    MRI brain showed status post resection of right frontal lobe tumor without residual or recurrent tumor. 2. Otherwise negative MRI of the brain.       03/02/2016 Imaging    Stable MRI post right frontal lobe tumor resection. Negative for recurrent tumor      06/01/2016 Imaging    Stable MRI post right frontal lobe tumor resection. No recurrent tumor identified      08/19/2016 Imaging    Unchanged examination without tumor recurrence.      11/17/2016 Imaging    Stable right frontal resection cavity. Stable focal mass effect within the right middle frontal gyrus at the superior margin of the cavity which may represent nonenhancing neoplasm. No evidence for tumor progression. Otherwise stable MRI of the brain      12/16/2016 Imaging    MR brain: 1. No acute intracranial abnormality. 2. Unchanged appearance of right frontal lobe resection cavity and surrounding hyperintense T2 weighted signal.      02/04/2017 Imaging    Repeat MRI Normal pituitary  Stable postop changes of right frontal tumor resection       Interval History:  Victoria Bates  Victoria Bates presents today for follow up and MRI review.  She denies any new or progressive neurologic deficits.  She continues to have chronic headaches treated with Fioricet and Oxycodone.  Recently she presented to the ED with diffuse chest pain, but workup was unremarkable.  Marinol has helped appetite somewhat.       Medications: Current Outpatient Medications on File Prior to Visit  Medication Sig Dispense Refill  . albuterol (PROVENTIL HFA;VENTOLIN HFA) 108 (90 Base) MCG/ACT inhaler Inhale 1-2 puffs into the lungs every 6 (six) hours as needed for wheezing or shortness of breath.    . amphetamine-dextroamphetamine (ADDERALL XR) 10 MG 24 hr capsule Take 10 mg by mouth daily.   0  . benzonatate (TESSALON) 100 MG capsule Take 2 capsules (200 mg  total) by mouth every 8 (eight) hours. 30 capsule 0  . butalbital-acetaminophen-caffeine (FIORICET, ESGIC) 50-325-40 MG tablet TAKE 1 TABLET BY MOUTH EVERY 6 HOURS AS NEEDED FOR HEADACHE 90 tablet 0  . carisoprodol (SOMA) 350 MG tablet TAKE 1 TABLET BY MOUTH 4 TIMES DAILY FOR SEIZURE PREVENTION 90 tablet 3  . Cholecalciferol (VITAMIN D) 2000 units CAPS Take 2,000 Units by mouth daily.    . Cyanocobalamin (VITAMIN B-12 PO) Take 2,500 Units by mouth daily.    . divalproex (DEPAKOTE ER) 250 MG 24 hr tablet Take 500 mg by mouth 2 (two) times daily.    Marland Kitchen dronabinol (MARINOL) 10 MG capsule Take 10 mg by mouth daily.     Marland Kitchen gabapentin (NEURONTIN) 300 MG capsule TAKE 3 CAPSULES BY MOUTH IN THE MORNING AND 3 CAPSULES IN THE EVENING 540 capsule 3  . HYDROcodone-acetaminophen (NORCO) 10-325 MG tablet Take 1 tablet by mouth every 4 (four) hours as needed for moderate pain. 90 tablet 0  . hydrocortisone (CORTEF) 10 MG tablet Take 10 mg by mouth See admin instructions. 1 tablet between 6-8am, 1/2 tablet at 11am, 1/2 tablet between 2-4pm daily, doubling dose on sick days     . levocetirizine (XYZAL) 5 MG tablet Take 1 tablet (5 mg total) by mouth every evening. 60 tablet 5  . LORazepam (ATIVAN) 1 MG tablet TAKE 1 TABLET BY MOUTH EVERY 8 HOURS AS NEEDED FOR ANXIETY OR SEIZURE 90 tablet 0  . metoprolol tartrate (LOPRESSOR) 25 MG tablet Take 0.5 tablets (12.5 mg total) by mouth 2 (two) times daily as needed (Palpitations). 30 tablet 0  . montelukast (SINGULAIR) 10 MG tablet TAKE 1 TABLET BY MOUTH AT BEDTIME (Patient not taking: Reported on 05/05/2017) 30 tablet 0  . prochlorperazine (COMPAZINE) 10 MG tablet Take 1 tablet (10 mg total) by mouth every 6 (six) hours as needed for nausea or vomiting. (Patient not taking: Reported on 04/21/2017) 60 tablet 0  . zolmitriptan (ZOMIG) 5 MG tablet Take 5 mg by mouth as needed for migraine.     No current facility-administered medications on file prior to visit.     Allergies:   Allergies  Allergen Reactions  . Adhesive [Tape] Other (See Comments)    Skin tear  . Latex Hives  . Percocet [Oxycodone-Acetaminophen] Other (See Comments)    makes pt angry  . Shellfish Allergy Diarrhea and Nausea And Vomiting  . Skin Adhesives     Per patient states reaction to dermabond when had hysterectomy. Stated was red and irritated.   Manuela Neptune [Carisoprodol]     Watson brand, specifically, causes disorientation (can take other brands)  . Tramadol Hcl Other (See Comments)    Potentially worsen seizure control  .  Vancomycin Hives  . Zofran [Ondansetron Hcl]     Pt. Stated side effects : abdominal pain / constipation    Past Medical History:  Past Medical History:  Diagnosis Date  . Anemia   . Anxiety   . Asthma    very mild- no inhaler use since July 2013  . Brain tumor ([facility])    Hx: of  . Cancer ([facility]) 2003   skin  . Chronic fatigue 10/03/2014  . Cognitive impairment 08/13/2015  . Complication of anesthesia    takes longer for patient to go under anesthesia  . Dizziness   . Dysphasia 02/12/2014  . Glioma, malignant ([facility]) dx'd 09/2013  . Headache 02/12/2014  . Headache(784.0)    migraines  . Herniated disc   . Hot flashes 10/03/2014  . Infection    to right head incision  . 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 Liver hemangioma   . Liver lesion 08/22/2014  . Nausea alone 02/12/2014  . Neuropathic pain 04/08/2014  . 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 Seizures ([facility])    petit mal serizures most days   Past Surgical History:  Past Surgical History:  Procedure Laterality Date  . ABDOMINAL HYSTERECTOMY    . CESAREAN SECTION    . coposcopy  08/06/2012   cervical biopsy  . left knee surgery  2000   torn meniscus  . right flank surgery  2005   bnign mass from flank area-right  . ROTATOR CUFF REPAIR    . WISDOM TOOTH EXTRACTION     Social History:  Social History   Socioeconomic History  . Marital status: Single    Spouse name: Not on file  . Number of children: 2    . Years of education: College  . Highest education level: Not on file  Social Needs  . Financial resource strain: Not on file  . Food insecurity - worry: Not on file  . Food insecurity - inability: Not on file  . Transportation needs - medical: Not on file  . Transportation needs - non-medical: Not on file  Occupational History  . Occupation: Engineer, mining: [employer]  Tobacco Use  . Smoking status: Never Smoker  . Smokeless tobacco: Never Used  Substance and Sexual Activity  . Alcohol use: No    Alcohol/week: 0.0 oz  . Drug use: No  . Sexual activity: Yes    Birth control/protection: None  Other Topics Concern  . Not on file  Social History Narrative   Patient lives at home with her two children.   Caffeine use: minimum of 4 cups daily   Family History:  Family History  Problem Relation Age of Onset  . Hypertension Mother   . Skin cancer Mother   . Thyroid cancer Father   . Skin cancer Father   . Breast cancer Maternal Aunt        dx in her 25s  . Prostate cancer Maternal Uncle 63  . Melanoma Maternal Uncle   . Ovarian cancer Maternal Grandmother        dx in her late 48s-40s  . Breast cancer Maternal Aunt        bilateral breast cancer dx in 36s and 75s; possibly BRCA+  . Other Son        multiple CAL spots, bright spots on brain MRI, ? chiari malformation    Review of Systems: Constitutional: Denies fevers, chills or abnormal weight loss Eyes: Denies blurriness of vision Ears, nose, mouth,  throat, and face: Denies mucositis or sore throat Respiratory: Denies cough, dyspnea or wheezes Cardiovascular: Denies palpitation, chest discomfort or lower extremity swelling Gastrointestinal:  Denies nausea, constipation, diarrhea GU: Early satiety, poor appetite Skin: Denies abnormal skin rashes Neurological: Per HPI Musculoskeletal: Denies joint pain, back or neck discomfort. No decrease in ROM Behavioral/Psych: +anxiety  Physical Exam: Vitals:    05/23/17 1120 05/23/17 1121  BP: (!) 189/109 140/86  Pulse: (!) 102 (!) 126  Resp: 20   Temp: 97.9 F (36.6 C)    KPS: 90. General: Alert, cooperative, pleasant, in no acute distress Head: Normal EENT: No conjunctival injection or scleral icterus. Oral mucosa moist Lungs: Resp effort normal Cardiac: Regular rate and rhythm Abdomen: Soft, non-distended abdomen Skin: No rashes cyanosis or petechiae. Extremities: No clubbing or edema  Neurologic Exam: Mental Status: Awake, alert, attentive to examiner. Oriented to self and environment. Language is fluent with intact comprehension.  Cranial Nerves: Visual acuity is grossly normal. Visual fields are full. Extra-ocular movements intact. No ptosis. Face is symmetric, tongue midline. Motor: Tone and bulk are normal. Power is full in both arms and legs. Reflexes are symmetric, no pathologic reflexes present. Intact finger to nose bilaterally Sensory: Intact to light touch and temperature Gait: Normal and tandem gait is normal.   Labs: I have reviewed the data as listed    Component Value Date/Time   NA 135 05/04/2017 1849   NA 140 04/20/2017 1140   K 3.7 05/04/2017 1849   K 4.1 04/20/2017 1140   CL 105 05/04/2017 1849   CO2 23 05/04/2017 1849   CO2 26 04/20/2017 1140   GLUCOSE 98 05/04/2017 1849   GLUCOSE 85 04/20/2017 1140   BUN 7 05/04/2017 1849   BUN 12.1 04/20/2017 1140   CREATININE 0.54 05/04/2017 1849   CREATININE 0.7 04/20/2017 1140   CALCIUM 8.9 05/04/2017 1849   CALCIUM 9.9 04/20/2017 1140   PROT 7.0 04/20/2017 1140   ALBUMIN 4.2 04/20/2017 1140   AST 14 04/20/2017 1140   ALT 12 04/20/2017 1140   ALKPHOS 42 04/20/2017 1140   BILITOT 0.44 04/20/2017 1140   GFRNONAA >60 05/04/2017 1849   GFRAA >60 05/04/2017 1849   Lab Results  Component Value Date   WBC 3.2 (L) 05/04/2017   NEUTROABS 1.0 (L) 04/20/2017   HGB 12.4 05/04/2017   HCT 36.8 05/04/2017   MCV 96.6 05/04/2017   PLT 182 05/04/2017     Imaging: [facility] Clinician Interpretation: I have personally reviewed the CNS images as listed.  My interpretation, in the context of the patient's clinical presentation, is stable disease  External MRI brain images from 05/19/17 were uploaded and available for review.     Assessment/Plan No diagnosis found. We appreciate the opportunity to participate in the care of Victoria Bates.      She is clinically and radiographically stable.   We recommend continuing to follow with serial MRIs.  Her next study may be performed in 6 months.    Seizures will continue to be managed by Dr. Delice Lesch for the time being, has upcoming appointment.  We made some recommendations for lifestyle changes for chronic headaches, including stress management, diet, and gradual withdrawal of analgesia.  All questions were answered. The patient knows to call the clinic with any problems, questions or concerns. No barriers to learning were detected.  The total time spent in the appointment was 40 minutes and more than 50% was on counseling and review of test results   Acey Lav  Mickeal Skinner, MD Medical Director of Neuro-Oncology [facility] at [place] 05/23/17 11:13 AM

## 2017-05-26 ENCOUNTER — Telehealth: Payer: Self-pay | Admitting: Cardiology

## 2017-05-26 ENCOUNTER — Telehealth: Payer: Self-pay | Admitting: *Deleted

## 2017-05-26 ENCOUNTER — Telehealth: Payer: Self-pay | Admitting: Internal Medicine

## 2017-05-26 ENCOUNTER — Encounter: Payer: Self-pay | Admitting: Interventional Cardiology

## 2017-05-26 NOTE — Telephone Encounter (Signed)
Left message for patient to call back  

## 2017-05-26 NOTE — Telephone Encounter (Signed)
Patient called requesting to speak w/ you. Wanted to know if 1:30 PM was ok w/ you?

## 2017-05-26 NOTE — Telephone Encounter (Signed)
Spoke with patient to schedule appts per 11/6 los - patient said she will call back and schedule appts.

## 2017-05-26 NOTE — Telephone Encounter (Signed)
Patient called to clarify when she is to see Dr. Mickeal Skinner next.  States she was confused as to who was handling which part of her care at this time.  Explained Dr. Mickeal Skinner to follow brain scans and she would see Dr. Delice Lesch for other neuro management per last note by Dr. Mickeal Skinner.  Patient states understanding.

## 2017-05-30 ENCOUNTER — Telehealth: Payer: Self-pay | Admitting: *Deleted

## 2017-05-30 ENCOUNTER — Telehealth: Payer: Self-pay

## 2017-05-30 ENCOUNTER — Other Ambulatory Visit: Payer: Self-pay | Admitting: Interventional Cardiology

## 2017-05-30 NOTE — Telephone Encounter (Signed)
I cannot say she is wheelchair bound I can say she has high risk of fall due to recurrent seizures and she needs a wheelchair for safety reason. Is that acceptable?

## 2017-05-30 NOTE — Telephone Encounter (Signed)
Family is taking a vacation to [facility]. [employer] requires a note from the MD that pt is wheelchair bound. Needed by Friday.

## 2017-05-30 NOTE — Telephone Encounter (Signed)
LM with note below. To call us back if she wants to change appts

## 2017-05-30 NOTE — Telephone Encounter (Signed)
-----   Message from Heath Lark, MD sent at 05/29/2017  9:42 AM EST ----- Regarding: RE: f/u  FYI the earliest I have got is 11/26  I will get Collis Thede to call first to see if this makes sense Raden Byington, I can see her on 11/26 at 2 pm, 30 mins if it is OK with her ----- Message ----- From: Nicholaus Corolla Sent: 05/29/2017   9:34 AM To: Heath Lark, MD, Nicholaus Corolla Subject: f/u                                            Patient had called in and wanted to see If she can get an appointment moved up sooner that 11/29 - any day is fine with her but 11/19. Please send sch message for patient if its okay for patient to come in earlier.   Thanks (:

## 2017-05-31 MED FILL — DRONABINOL 10 MG CAPS: 10 | 30 days supply | Qty: 60 | Fill #1

## 2017-05-31 NOTE — Telephone Encounter (Addendum)
S/w [name] and he is fine with that.

## 2017-06-01 ENCOUNTER — Encounter: Payer: Self-pay | Admitting: Hematology and Oncology

## 2017-06-01 ENCOUNTER — Other Ambulatory Visit: Payer: Self-pay | Admitting: Hematology and Oncology

## 2017-06-01 MED ORDER — LORAZEPAM 1 MG PO TABS: ORAL_TABLET | ORAL | 0 refills | Status: DC

## 2017-06-01 MED ORDER — BUTALBITAL-APAP-CAFFEINE 50-325-40 MG PO TABS: ORAL_TABLET | ORAL | 0 refills | Status: DC

## 2017-06-01 MED ORDER — HYDROCODONE-ACETAMINOPHEN 10-325 MG PO TABS
1.0000 | ORAL_TABLET | ORAL | 0 refills | Status: DC | PRN
Start: 2017-06-01 — End: 2017-06-15

## 2017-06-01 MED FILL — LORazepam 1 MG TABS: 1 | 30 days supply | Qty: 90 | Fill #0

## 2017-06-01 MED FILL — HYDROCODON-APAP 10-325: 10-325 | 15 days supply | Qty: 90 | Fill #0

## 2017-06-05 ENCOUNTER — Ambulatory Visit (INDEPENDENT_AMBULATORY_CARE_PROVIDER_SITE_OTHER): Payer: Medicare Other | Admitting: Neurology

## 2017-06-05 ENCOUNTER — Encounter: Payer: Self-pay | Admitting: Neurology

## 2017-06-05 VITALS — BP 130/78 | HR 79 | Ht 68.5 in | Wt 139.0 lb

## 2017-06-05 DIAGNOSIS — G40109 Localization-related (focal) (partial) symptomatic epilepsy and epileptic syndromes with simple partial seizures, not intractable, without status epilepticus: Secondary | ICD-10-CM | POA: Diagnosis not present

## 2017-06-05 DIAGNOSIS — E271 Primary adrenocortical insufficiency: Secondary | ICD-10-CM | POA: Diagnosis not present

## 2017-06-05 DIAGNOSIS — C719 Malignant neoplasm of brain, unspecified: Secondary | ICD-10-CM | POA: Diagnosis not present

## 2017-06-05 DIAGNOSIS — F419 Anxiety disorder, unspecified: Secondary | ICD-10-CM | POA: Diagnosis not present

## 2017-06-05 DIAGNOSIS — C711 Malignant neoplasm of frontal lobe: Secondary | ICD-10-CM | POA: Diagnosis not present

## 2017-06-05 DIAGNOSIS — R918 Other nonspecific abnormal finding of lung field: Secondary | ICD-10-CM | POA: Diagnosis not present

## 2017-06-05 DIAGNOSIS — Z9221 Personal history of antineoplastic chemotherapy: Secondary | ICD-10-CM | POA: Diagnosis not present

## 2017-06-05 DIAGNOSIS — Z9889 Other specified postprocedural states: Secondary | ICD-10-CM | POA: Diagnosis not present

## 2017-06-05 DIAGNOSIS — Z8619 Personal history of other infectious and parasitic diseases: Secondary | ICD-10-CM | POA: Diagnosis not present

## 2017-06-05 MED ORDER — DIVALPROEX SODIUM ER 250 MG PO TB24: 500.0000 mg | ORAL_TABLET | Freq: Two times a day (BID) | ORAL | 3 refills | Status: DC

## 2017-06-05 MED ORDER — GABAPENTIN 300 MG PO CAPS: ORAL_CAPSULE | ORAL | 3 refills | Status: DC

## 2017-06-05 MED FILL — BUTALB-ACETAMIN-CAFF 50-325: 50-325-40 | 23 days supply | Qty: 90 | Fill #0

## 2017-06-05 NOTE — Patient Instructions (Addendum)
1. Increase Gabapentin 300mg : Take 3 caps in AM, 4 caps in PM (try taking the extra dose at noon, but if having problems take all at night) 2. Continue Depakote 250mg  2 tabs twice a day for now. After a month on the higher dose of gabapentin and no effect, increase Depakote to 2 tabs in AM, 3 tabs in PM 3. Refer to Psychiatry for anxiety 4. Discuss concerns with Dr. Salomon Fick 5. Discuss B12 shot with your PCP 6. Follow-up in 6 months, call for any changes  Seizure Precautions: 1. If medication has been prescribed for you to prevent seizures, take it exactly as directed.  Do not stop taking the medicine without talking to your doctor first, even if you have not had a seizure in a long time.   2. Avoid activities in which a seizure would cause danger to yourself or to others.  Don't operate dangerous machinery, swim alone, or climb in high or dangerous places, such as on ladders, roofs, or girders.  Do not drive unless your doctor says you may.  3. If you have any warning that you may have a seizure, lay down in a safe place where you can't hurt yourself.    4.  No driving for 6 months from last seizure, as per [facility].   Please refer to the following link on the [facility] website for more information: http://[email or web address].org/answerplace/[email or web address]/driving/drivingu.cfm   5.  Maintain good sleep hygiene. Avoid alcohol.  6.  Notify your neurology if you are planning pregnancy or if you become pregnant.  7.  Contact your doctor if you have any problems that may be related to the medicine you are taking.  8.  Call 911 and bring the patient back to the ED if:        A.  The seizure lasts longer than 5 minutes.       B.  The patient doesn't awaken shortly after the seizure  C.  The patient has new problems such as difficulty seeing, speaking or moving  D.  The patient was injured during the seizure  E.  The patient has a temperature over 102 F (39C)  F.   The patient vomited and now is having trouble breathing

## 2017-06-05 NOTE — Progress Notes (Signed)
NEUROLOGY FOLLOW UP OFFICE NOTE  Victoria Bates [id number]  DOB: 1980/07/24  HISTORY OF PRESENT ILLNESS: I had the pleasure of seeing Victoria Bates in follow-up in the neurology clinic on 06/05/2017. She is accompanied by her husband who help supplement the history today. The patient was last seen 4 months ago for seizures. She has a history of a right frontal oligodendroglioma s/p subtotal resection. 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 She had an MRI pituitary in July 2018 which was normal. It showed stable post-op changes in the right frontal tumor resection. She has seen Dr. Mickeal Skinner with Neuro-oncology, stable findings with recommendation for serial MRIs. She was at [facility] ER on 10/19 for chest pain and had a negative chest CTA, limited due to bolus timing. She went to [facility] ER on 10/26 for continued chest pain radiating across her chest into her back, nausea, vomiting, dizziness, headache, shortness of breath. Records reviewed, she had tachycardia with pleuritic and positional chest pain, initially improved with Toradol and IV fluids. She had a TTE that was unremarkable, then reported worsening symptoms after TTE. She had an MRI brain which appeared similar to MRI done in July 2018. Depakote level was 19. She has contacted her cardiologist and symptoms are not felt to be cardiac-related. She is reporting that she feels a lot of bubbling going up her stomach to her chest, she burps and the chest pain temporarily reduces. She feels it is different from GERD, and reports trying Protonix, Nexium, and compazine with no effect. She continues to have multiple issues, and is concerned about NPH (reassured she does not have NPH), purple discoloration in her feet and hands, easy bruising, urinary frequency. She also wonders about the changes on her MRI brain and if  functional MRI can be done, she will discuss this with her neurosurgeon today.   She and her husband report almost daily seizures where she would be stuttering and both hands are shaky, she get really tired.She is halfway decent in the morning, then hits a wall at 3pm. She takes 1/2 dose Adderall in the morning. She is taking gabapentin 961m BID and Depakote 5062mBID, denies missing doses of her seizure medications. She continues to have frequent headaches, and worries that she keeps blowing blood vessels in her right eye. She has noticed more vision issues, with blurred vision and depth perception difficulties (patterns look like they are moving). She is asking about getting a B12 injection today because the B12 oral supplements are not helping with her energy, she reports B12 level was low with her PCP but we do not have documentation of this. Her last vitamin D level in July 2018 was low, 26.5.   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 [facility] due to left vision changes and left facial heaviness. She reports that she had a 60-lb unintentional weight loss 4 months leading up to her admission, she was vomiting after every meal. She started having episodes where her hands would have low amplitude tremors and she feels the right lower face twitching. She would need to pause and take a minute to get words out. If she did not stop what she was doing, the symptoms could last for hours.  She would feel like her body was "going 90 mph" with palpitations up to 200 bpm. She was started on Xanax for anxiety, but this did not help. She then started having vision changes on her left eye, she would park crookedly when she thought she was going straight. She saw an ophthalmologist, eye exam was normal but left optic neuritis was questioned and brain imaging  recommended. She then started to have left facial heaviness and numbness on the left face, arm, and leg, and drove herself to [facility] where she was found to have a right frontal lesion. She underwent subtotal resection on 01/23/14 by Dr. Christella Noa and was started on Keppra for seizure prophylaxis at that time. She continued to have the recurrent episodes of tremors in both arms, palpitations, and right facial tightness, as well as episodes of left face, neck, shoulder, and arm numbness, going down the left thumb and index finger. She describes her major seizures as "all of that times 100." After the episodes, she would be tired for a few days, wanting to sleep, with no energy. She has noticed that her short term memory is "gone." Keppra dose had been increased up to 2 grams daily, and this had caused a change in personality. She had seen neurologist Dr. Leonie Man, who switched her to Depakote. She tapered off Keppra and is currently on Depakote ER 594m BID with significant improvement in symptoms. She denies any side effects to the Depakote.   She has a history of recurrent headaches since her teenage years. Headaches are usually over the right temporal and occipital region, with sharp stabbing and throbbing pain, with a knot in her left neck region. There is occasional nausea, photo, and phonophobia. She was having these headaches around 1-2 times a week when younger, however since December 2014, headaches have been occurring around 2-3 times a week. She had been receiving injections in her neck for this. If she takes medication at the onset, they last a few hours, however they may last up to several days. She has found that taking Decadron for a couple of days has been the only helpful medication. She takes Fioricet rarely. Zomig, aspirin, and Tylenol were ineffective in the past. Her father and 350year old son have headaches. She denies any diplopia, dysarthria, dysphagia, bowel/bladder dysfunction. She has chronic back  pain and had been taking Soma qid for several years, with worsening of pain when she tried reducing this. She was started on Gabapentin for the seizures and back pain, she is unsure if this helps. She had been taking Ultram for the pain, and once stopping this, she noticed an improvement in the seizures as well.   Diagnostic Data: [facility]otemporal regions which is likely from breach artifact from prior craniotomy. There is intermittent right frontal sharp activity but no definite epileptiform activity. Impression: Mild right hemispheric focal cortical irritability but no definitive epileptiform activity is noted.  Her 48-hour EEG did not show any epileptiform discharges. It showed breach artifact consistent with prior surgery. She reported numerous symptoms including headache, severe muscle spasm under the base of her skull on the right side, tremors in both hands, jerking and pause in jaw (right side), left leg/inner thigh and left foot warm and pigmented, eyes twitching, fatigue, with no associated epileptiform correlate seen. [facility]: Depakote and Gabapentin were held. Typical events were not clearly captured,  however she reported 2 spells where she reported dizziness and visual changes (tiles on floor looked uneven) with no EEG correlate. Baseline EEG showed breach rhythm over the right hemisphere, no epileptiform discharges or electrographic seizures.  I personally reviewed MRI brain with and without contrast done 03/13/14 which showed post-op right-sided craniotomy. The surgical cavity in the right frontal lobe is filled with fluid, with note of improvement in layering blood products since prior study. The study of 01/24/2014 showed restricted diffusion deep to the tumor. This is consistent with acute infarct which is resolving at this time. No acute infarct is present on  today's study. No evidence of recurrent or residual tumor although the original tumor did not enhance. No mass effect or edema is present that would suggest recurrent tumor. Small amount of subarachnoid hemorrhage on the right again noted. MRI 11/2015 which I personally reviewed, there is similar pattern of enhancement and abnormal FLAIR signal in the right frontal gyrus immediately superior to the resection, stable from previous scan.   Epilepsy Risk Factors: Right frontal oligodendroglioma s/p subtotal resection. Otherwise she had a normal birth and early development. There is no history of febrile convulsions, CNS infections such as meningitis/encephalitis, or family history of seizures.  PAST MEDICAL HISTORY: Past Medical History:  Diagnosis Date  . Anemia   . Anxiety   . Asthma    very mild- no inhaler use since July 2013  . Brain tumor ([place])    Hx: of  . Cancer ([place]) 2003   skin  . Chronic fatigue 10/03/2014  . Cognitive impairment 08/13/2015  . Complication of anesthesia    takes longer for patient to go under anesthesia  . Dizziness   . Dysphasia 02/12/2014  . Glioma, malignant ([place]) dx'd 09/2013  . Headache 02/12/2014  . Headache(784.0)    migraines  . Herniated disc   . Hot flashes 10/03/2014  . Infection    to right head incision  . 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 Liver hemangioma   . Liver lesion 08/22/2014  . Nausea alone 02/12/2014  . Neuropathic pain 04/08/2014  . Petit mal seizure status ([place])   . Seizures ([place])    petit mal serizures most days    MEDICATIONS:  Outpatient Encounter Medications as of 06/05/2017  Medication Sig  . albuterol (PROVENTIL HFA;VENTOLIN HFA) 108 (90 Base) MCG/ACT inhaler Inhale 1-2 puffs into the lungs every 6 (six) hours as needed for wheezing or shortness of breath.  . amphetamine-dextroamphetamine (ADDERALL XR) 10 MG 24 hr capsule Take 10 mg by mouth daily.   . benzonatate (TESSALON) 100 MG capsule Take 2 capsules (200 mg total) by  mouth every 8 (eight) hours. (Patient not taking: Reported on 05/23/2017)  . butalbital-acetaminophen-caffeine (FIORICET, ESGIC) 50-325-40 MG tablet TAKE 1 TABLET BY MOUTH EVERY 6 HOURS AS NEEDED FOR HEADACHE  . carisoprodol (SOMA) 350 MG tablet TAKE 1 TABLET BY MOUTH 4 TIMES DAILY FOR SEIZURE PREVENTION  . Cholecalciferol (VITAMIN D) 2000 units CAPS Take 2,000 Units by mouth daily.  . Cyanocobalamin (VITAMIN B-12 PO) Take 5,000 Units daily by mouth.   . divalproex (DEPAKOTE ER) 250 MG 24 hr tablet Take 500 mg by mouth 2 (two) times daily.  Marland Kitchen dronabinol (MARINOL) 10 MG capsule Take 10 mg by mouth daily.   Marland Kitchen gabapentin (NEURONTIN) 300 MG capsule TAKE 3 CAPSULES BY MOUTH IN THE MORNING AND 3 CAPSULES IN THE EVENING  . HYDROcodone-acetaminophen (NORCO) 10-325 MG tablet Take 1 tablet every 4 (four) hours as needed by  mouth for moderate pain.  . hydrocortisone (CORTEF) 10 MG tablet Take 10 mg by mouth See admin instructions. 1 tablet between 6-8am, 1/2 tablet at 11am, 1/2 tablet between 2-4pm daily, doubling dose on sick days   . levocetirizine (XYZAL) 5 MG tablet Take 1 tablet (5 mg total) by mouth every evening.  Marland Kitchen LORazepam (ATIVAN) 1 MG tablet TAKE 1 TABLET BY MOUTH EVERY 8 HOURS AS NEEDED FOR ANXIETY OR SEIZURE  . metoprolol tartrate (LOPRESSOR) 25 MG tablet TAKE 1/2 TABLET TWICE A DAY AS NEEDED FOR PALPITATIONS  . montelukast (SINGULAIR) 10 MG tablet TAKE 1 TABLET BY MOUTH AT BEDTIME  . prochlorperazine (COMPAZINE) 10 MG tablet Take 1 tablet (10 mg total) by mouth every 6 (six) hours as needed for nausea or vomiting. (Patient not taking: Reported on 05/23/2017)  . promethazine (PHENERGAN) 25 MG tablet Take 25 mg every 6 (six) hours as needed by mouth for nausea or vomiting.  . zolmitriptan (ZOMIG) 5 MG tablet Take 5 mg by mouth as needed for migraine.   No facility-administered encounter medications on file as of 06/05/2017.      ALLERGIES: Allergies  Allergen Reactions  . Adhesive [Tape]  Other (See Comments)    Skin tear  . Latex Hives  . Percocet [Oxycodone-Acetaminophen] Other (See Comments)    makes pt angry  . Shellfish Allergy Diarrhea and Nausea And Vomiting  . Skin Adhesives     Per patient states reaction to dermabond when had hysterectomy. Stated was red and irritated.   Manuela Neptune [Carisoprodol]     Watson brand, specifically, causes disorientation (can take other brands)  . Tramadol Hcl Other (See Comments)    Potentially worsen seizure control  . Vancomycin Hives  . Zofran [Ondansetron Hcl]     Pt. Stated side effects : abdominal pain / constipation     FAMILY HISTORY: Family History  Problem Relation Age of Onset  . Hypertension Mother   . Skin cancer Mother   . Thyroid cancer Father   . Skin cancer Father   . Breast cancer Maternal Aunt        dx in her 23s  . Prostate cancer Maternal Uncle 63  . Melanoma Maternal Uncle   . Ovarian cancer Maternal Grandmother        dx in her late 62s-40s  . Breast cancer Maternal Aunt        bilateral breast cancer dx in 26s and 57s; possibly BRCA+  . Other Son        multiple CAL spots, bright spots on brain MRI, ? chiari malformation    SOCIAL HISTORY: Social History   Socioeconomic History  . Marital status: Single    Spouse name: Not on file  . Number of children: 2  . Years of education: College  . Highest education level: Not on file  Social Needs  . Financial resource strain: Not on file  . Food insecurity - worry: Not on file  . Food insecurity - inability: Not on file  . Transportation needs - medical: Not on file  . Transportation needs - non-medical: Not on file  Occupational History  . Occupation: Engineer, mining: [employer]  Tobacco Use  . Smoking status: Never Smoker  . Smokeless tobacco: Never Used  Substance and Sexual Activity  . Alcohol use: No    Alcohol/week: 0.0 oz  . Drug use: No  . Sexual activity: Yes    Birth control/protection: None  Other Topics  Concern  .  Not on file  Social History Narrative   Patient lives at home with her two children.   Caffeine use: minimum of 4 cups daily    REVIEW OF SYSTEMS: Constitutional: No fevers, chills, or sweats, + generalized fatigue, change in appetite Eyes: No visual changes, double vision, eye pain Ear, nose and throat: No hearing loss, ear pain, nasal congestion, sore throat Cardiovascular: No chest pain, palpitations Respiratory:  No shortness of breath at rest or with exertion, wheezes GastrointestinaI: No nausea, vomiting, diarrhea, abdominal pain, fecal incontinence Genitourinary:  No dysuria, urinary retention or frequency Musculoskeletal:  + neck pain, back pain Integumentary: No rash, pruritus, skin lesions Neurological: as above Psychiatric: No depression, insomnia, +anxiety Endocrine: No palpitations, fatigue, diaphoresis, mood swings, change in appetite, change in weight, increased thirst Hematologic/Lymphatic:  No anemia, purpura, petechiae. Allergic/Immunologic: no itchy/runny eyes, nasal congestion, recent allergic reactions, rashes  PHYSICAL EXAM: Vitals:   06/05/17 1127  BP: 130/78  Pulse: 79  SpO2: 99%   General: No acute distress, anxious Head:  Normocephalic/atraumatic Neck: supple, no paraspinal tenderness, full range of motion Heart:  Regular rate and rhythm Lungs:  Clear to auscultation bilaterally Back: No paraspinal tenderness Skin/Extremities: No rash, no edema Neurological Exam: alert and oriented to person, place, and time. No aphasia or dysarthria. Fund of knowledge is appropriate.  Recent and remote memory are intact.  Attention and concentration are normal.   Cranial nerves: Pupils equal, round. No facial asymmetry. Tongue, uvula, palate midline.  Motor: moves all extremities symmetrically. Gait narrow-based and steady.  IMPRESSION: This is a [name] with a history of grade 2 oligodengroglioma s/p subtotal resection on chemotherapy, anxiety,  headaches, with recurrent episodes of low amplitude tremors, palpitations, and right facial tightening, as well as left face and arm numbness. She had a 48-hour EEG which did not show any epileptiform discharges. [facility], otherwise no epileptiform discharges. She did not have typical events and hence these could not be commented on. She continues to report seizures on a daily basis, she is also reporting a lot of nerve issues in her extremities. We agreed to increase gabapentin 380m: Take 3 caps in AM, 4 caps in PM. She will see how she feels for a month on this, if no improvement in seizures or headaches, she is agreeable to increasing the Depakote 2514mto 2 tabs in AM, 3 tabs in PM (Depakote level low last month). She has been diagnosed with secondary Addison's disease, with very low cortisol levels thought to be due to pituitary dysfunction, MRI pituitary normal. She is wondering about an fMRI and will discuss this with Dr. TaSalomon FickShe was noted to have a more than 30-pt drop in BP from sitting to standing, with increase in heart rate on her last visit, and continues to deal with labile BP and tachycardia, wondering about autonomic neuropathy. She will best benefit from going to a tertiary care facility for this complicated issue and will discuss this with Dr[facility] anxiety noted on Neuropsych testing, she is agreeable to seeing a Psychiatrist, referral sent. She is aware of [place] driving laws to stop driving after an episode of loss of consciousness/awareness until 6 months event-free. She is asking for a B12 injection but low B12 has not been documented in our office, she was advised to speak with her treating physician. She has several other questions/symptoms that do not appear neurologic in origin (easy  bruising, purplish discoloration of skin, urinary frequency), and was advised to speak to her PCP and other  physicians about this. She will follow-up in 6 months.   Thank you for allowing me to participate in her care.  Please do not hesitate to call for any questions or concerns.  The duration of this appointment visit was 25 minutes of face-to-face time with the patient.  Greater than 50% of this time was spent in counseling, explanation of diagnosis, planning of further management, and coordination of care.   Ellouise Newer, M.D.   CC: Dr. Buddy Duty, Dr. Alvy Bimler, Dr. Salomon Fick, Dr. Laurann Montana

## 2017-06-12 ENCOUNTER — Encounter: Payer: Self-pay | Admitting: *Deleted

## 2017-06-12 NOTE — Progress Notes (Signed)
Received fax from Dr. Angelene Giovanni, MD at [facility].    Notes included from recent visit with patient Victoria Bates and his recommendations for Dr. Mickeal Skinner.    Given to Dr. Mickeal Skinner for review and sent to scanning department to be scanned into [facility].

## 2017-06-15 ENCOUNTER — Telehealth: Payer: Self-pay | Admitting: Hematology and Oncology

## 2017-06-15 ENCOUNTER — Other Ambulatory Visit (HOSPITAL_BASED_OUTPATIENT_CLINIC_OR_DEPARTMENT_OTHER): Payer: Medicare Other

## 2017-06-15 ENCOUNTER — Ambulatory Visit (HOSPITAL_BASED_OUTPATIENT_CLINIC_OR_DEPARTMENT_OTHER): Payer: Medicare Other | Admitting: Hematology and Oncology

## 2017-06-15 ENCOUNTER — Ambulatory Visit (HOSPITAL_BASED_OUTPATIENT_CLINIC_OR_DEPARTMENT_OTHER): Payer: Medicare Other

## 2017-06-15 DIAGNOSIS — G44021 Chronic cluster headache, intractable: Secondary | ICD-10-CM

## 2017-06-15 DIAGNOSIS — E2740 Unspecified adrenocortical insufficiency: Secondary | ICD-10-CM

## 2017-06-15 DIAGNOSIS — T50905D Adverse effect of unspecified drugs, medicaments and biological substances, subsequent encounter: Secondary | ICD-10-CM

## 2017-06-15 DIAGNOSIS — G40109 Localization-related (focal) (partial) symptomatic epilepsy and epileptic syndromes with simple partial seizures, not intractable, without status epilepticus: Secondary | ICD-10-CM | POA: Diagnosis not present

## 2017-06-15 DIAGNOSIS — R4189 Other symptoms and signs involving cognitive functions and awareness: Secondary | ICD-10-CM

## 2017-06-15 DIAGNOSIS — R4702 Dysphasia: Secondary | ICD-10-CM

## 2017-06-15 DIAGNOSIS — R51 Headache: Secondary | ICD-10-CM

## 2017-06-15 DIAGNOSIS — C711 Malignant neoplasm of frontal lobe: Secondary | ICD-10-CM

## 2017-06-15 DIAGNOSIS — R110 Nausea: Secondary | ICD-10-CM | POA: Diagnosis not present

## 2017-06-15 DIAGNOSIS — R202 Paresthesia of skin: Secondary | ICD-10-CM

## 2017-06-15 DIAGNOSIS — J4520 Mild intermittent asthma, uncomplicated: Secondary | ICD-10-CM

## 2017-06-15 DIAGNOSIS — R0789 Other chest pain: Secondary | ICD-10-CM

## 2017-06-15 DIAGNOSIS — R519 Headache, unspecified: Secondary | ICD-10-CM

## 2017-06-15 DIAGNOSIS — Z7189 Other specified counseling: Secondary | ICD-10-CM

## 2017-06-15 DIAGNOSIS — Z452 Encounter for adjustment and management of vascular access device: Secondary | ICD-10-CM

## 2017-06-15 DIAGNOSIS — R7989 Other specified abnormal findings of blood chemistry: Secondary | ICD-10-CM

## 2017-06-15 LAB — COMPREHENSIVE METABOLIC PANEL
ALT: 11 U/L (ref 0–55)
AST: 14 U/L (ref 5–34)
Albumin: 3.9 g/dL (ref 3.5–5.0)
Alkaline Phosphatase: 42 U/L (ref 40–150)
Anion Gap: 9 mEq/L (ref 3–11)
BUN: 5.8 mg/dL — ABNORMAL LOW (ref 7.0–26.0)
CO2: 24 mEq/L (ref 22–29)
Calcium: 8.5 mg/dL (ref 8.4–10.4)
Chloride: 107 mEq/L (ref 98–109)
Creatinine: 0.7 mg/dL (ref 0.6–1.1)
EGFR: >60 mL/min/{1.73_m2} (ref >60)
Glucose: 85 mg/dl (ref 70–140)
Potassium: 3.9 mEq/L (ref 3.5–5.1)
Sodium: 140 mEq/L (ref 136–145)
Total Bilirubin: 0.36 mg/dL (ref 0.20–1.20)
Total Protein: 6.7 g/dL (ref 6.4–8.3)

## 2017-06-15 LAB — CBC WITH DIFFERENTIAL/PLATELET
BASO%: 1.3 % (ref 0.0–2.0)
Basophils Absolute: 0 10*3/uL (ref 0.0–0.1)
EOS%: 1.6 % (ref 0.0–7.0)
Eosinophils Absolute: 0 10*3/uL (ref 0.0–0.5)
HCT: 34.4 % — ABNORMAL LOW (ref 34.8–46.6)
HGB: 11.7 g/dL (ref 11.6–15.9)
LYMPH%: 33.6 % (ref 14.0–49.7)
MCH: 33.4 pg (ref 25.1–34.0)
MCHC: 34 g/dL (ref 31.5–36.0)
MCV: 98.4 fL (ref 79.5–101.0)
MONO#: 0.2 10*3/uL (ref 0.1–0.9)
MONO%: 9.7 % (ref 0.0–14.0)
NEUT#: 1.4 10*3/uL — ABNORMAL LOW (ref 1.5–6.5)
NEUT%: 53.8 % (ref 38.4–76.8)
Platelets: 199 10*3/uL (ref 145–400)
RBC: 3.49 10*6/uL — ABNORMAL LOW (ref 3.70–5.45)
RDW: 11.9 % (ref 11.2–14.5)
WBC: 2.5 10*3/uL — ABNORMAL LOW (ref 3.9–10.3)
lymph#: 0.9 10*3/uL (ref 0.9–3.3)

## 2017-06-15 MED ORDER — LORAZEPAM 1 MG PO TABS: ORAL_TABLET | ORAL | 0 refills | Status: DC

## 2017-06-15 MED ORDER — HEPARIN SOD (PORK) LOCK FLUSH 100 UNIT/ML IV SOLN
500.0000 [IU] | Freq: Once | INTRAVENOUS | Status: AC | PRN
  Administered 2017-06-15: 500 [IU] via INTRAVENOUS
  Filled 2017-06-15: qty 5

## 2017-06-15 MED ORDER — HYDROCODONE-ACETAMINOPHEN 10-325 MG PO TABS: 1.0000 | ORAL_TABLET | ORAL | 0 refills | Status: DC | PRN

## 2017-06-15 MED ORDER — SODIUM CHLORIDE 0.9 % IJ SOLN
10.0000 mL | INTRAMUSCULAR | Status: DC | PRN
  Administered 2017-06-15: 10 mL via INTRAVENOUS
  Filled 2017-06-15: qty 10

## 2017-06-15 MED ORDER — MONTELUKAST SODIUM 10 MG PO TABS: 10.0000 mg | ORAL_TABLET | Freq: Every day | ORAL | 1 refills | Status: DC

## 2017-06-15 MED ORDER — CARISOPRODOL 350 MG PO TABS: ORAL_TABLET | ORAL | 3 refills | Status: DC

## 2017-06-15 MED ORDER — DRONABINOL 10 MG PO CAPS: 10.0000 mg | ORAL_CAPSULE | Freq: Two times a day (BID) | ORAL | 0 refills | Status: DC

## 2017-06-15 MED ORDER — BUTALBITAL-APAP-CAFFEINE 50-325-40 MG PO TABS: ORAL_TABLET | ORAL | 0 refills | Status: DC

## 2017-06-15 NOTE — Telephone Encounter (Signed)
Scheduled appt per 11/29 los - Gave patient AVS .

## 2017-06-16 ENCOUNTER — Encounter: Payer: Self-pay | Admitting: Hematology and Oncology

## 2017-06-16 MED FILL — MONTELUKAST SOD 10 MG TAB: 10 | 90 days supply | Qty: 90 | Fill #0

## 2017-06-16 MED FILL — HYDROCODON-APAP 10-325: 10-325 | 15 days supply | Qty: 90 | Fill #0

## 2017-06-16 NOTE — Progress Notes (Signed)
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      09/27/2013 Pathology Results    Accession: [id number] pathology showed grade 2 oligodendroglioma with mutant IDH1 protein, ATRX, OLIG 2 expression. p53 is negative, low Ki-67 labeling index. 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      11/06/2013 Imaging    Repeat CT scan of the head showed resolution of abscess.      01/23/2014 Surgery    Dr. Christella Noa perform a subtotal gross resection of the tumor.      01/24/2014 Imaging    Repeat imaging studies showed persistent and residual disease.      03/03/2014 - 09/01/2014 Chemotherapy    She was started on Temodar, 5 days every 28 days      03/13/2014 Imaging    Repeat MRI showed no evidence of progression of disease.      07/14/2014 Imaging    Repeat MRI showed no evidence of disease progression.      09/12/2014 Imaging    MRI of the liver show mild regression of the size of lesions, presumed to be benign      09/15/2014 Imaging    Repeat MRI of the brain show persistent abnormalities, suspicious for residual disease      03/02/2015 Imaging     MRI brain at [facility] show persistent abnormalities      08/10/2015 Imaging    Repeat MRI [facility] showed stable disease      08/24/2015 - 02/08/2016 Chemotherapy     She was started on Lomustine, Procarbazine and Vincristine chemotherapy      08/30/2015 Imaging    CT chest showed no evidence of aspiration pneumonia      11/18/2015 Imaging    MRI brain showed status post resection of right frontal lobe tumor without residual or recurrent tumor. 2. Otherwise negative MRI of the brain.       03/02/2016 Imaging    Stable MRI post right frontal lobe tumor resection. Negative for recurrent tumor      06/01/2016 Imaging    Stable MRI post right frontal lobe tumor resection. No recurrent tumor identified      08/19/2016 Imaging    Unchanged examination without tumor recurrence.      11/17/2016 Imaging    Stable right frontal resection cavity. Stable focal mass effect within the right middle frontal gyrus at the superior margin of the cavity which may represent nonenhancing neoplasm. No evidence for tumor progression. Otherwise stable MRI of the brain      12/16/2016 Imaging    MR brain: 1. No acute intracranial abnormality. 2. Unchanged appearance of right frontal lobe resection cavity and surrounding hyperintense T2 weighted signal.      02/04/2017 Imaging    Repeat MRI Normal pituitary  Stable postop changes of right frontal tumor resection      05/12/2017 Imaging  The left ventricular size is normal. There is normal left ventricular wall thickness.  Left ventricular systolic function is normal. LV ejection fraction = 60-65%.  Left ventricular filling pattern is normal. The left ventricular wall motion is normal. The right ventricle is normal in size and function. There is no significant valvular stenosis or regurgitation No pulmonary hypertension. IVC size was normal. There is no pericardial effusion. There is no comparison study available      05/13/2017 Imaging    1.No evidence of acute pulmonary thromboembolic disease. 2.There is an 8 mm right lower lobe groundglass nodule. Statistically, in this age group, this is most  likely to reflect benign etiologies such as a tiny focus of infection or nonspecific inflammation. 4 mm solid right upper lobe lung nodule. A CT scan could be obtained in 12 months to further evaluate if the patient has any known risk factors for malignancy. 3.There is a 1.3 cm left adrenal nodule. This finding is incompletely characterized although and is slightly increased in size relative to the abdominal MRI from February 2016. Although this finding is most likely benign, recommend follow-up adrenal protocol CT in one year.      05/13/2017 Imaging    MRI brain 1.No acute intracranial abnormality. 2.Postsurgical changes of a right frontal craniotomy for resection of a reported oligodendroglioma. Hyperintense T2/FLAIR signal around the resection cavity and in the subcortical white matter of a gyrus along the superior margin of the cavity is unchanged from 01/05/2015. No evidence of disease progression.       INTERVAL HISTORY: Please see below for problem oriented charting. She returns with her husband today She had extensive evaluation at [facility] I reviewed outside records extensively The patient continues to have poorly controlled symptoms She have chronic pain, chronic headaches, chronic nausea, poor oral intake, frequent chest pain/shortness of breath/tachycardia, poor memory and difficulties remembering to take her medications as directed  REVIEW OF SYSTEMS:  All other systems were reviewed with the patient and are negative.  I have reviewed the past medical history, past surgical history, social history and family history with the patient and they are unchanged from previous note.  ALLERGIES:  is allergic to adhesive [tape]; latex; percocet [oxycodone-acetaminophen]; shellfish allergy; skin adhesives; soma [carisoprodol]; tramadol hcl; vancomycin; and zofran [ondansetron hcl].  MEDICATIONS:  Current Outpatient Medications  Medication Sig Dispense  Refill  . albuterol (PROVENTIL HFA;VENTOLIN HFA) 108 (90 Base) MCG/ACT inhaler Inhale 1-2 puffs into the lungs every 6 (six) hours as needed for wheezing or shortness of breath.    . amphetamine-dextroamphetamine (ADDERALL XR) 10 MG 24 hr capsule Take 10 mg by mouth daily.   0  . butalbital-acetaminophen-caffeine (FIORICET, ESGIC) 50-325-40 MG tablet TAKE 1 TABLET BY MOUTH EVERY 6 HOURS AS NEEDED FOR HEADACHE 90 tablet 0  . carisoprodol (SOMA) 350 MG tablet TAKE 1 TABLET BY MOUTH 4 TIMES DAILY FOR SEIZURE PREVENTION 90 tablet 3  . Cholecalciferol (VITAMIN D) 2000 units CAPS Take 2,000 Units by mouth daily.    . Cyanocobalamin (VITAMIN B-12 PO) Take 5,000 Units daily by mouth.     . divalproex (DEPAKOTE ER) 250 MG 24 hr tablet Take 2 tablets (500 mg total) 2 (two) times daily by mouth. Take 2 tabs in AM, 3 tabs in PM 450 tablet 3  . dronabinol (MARINOL) 10 MG capsule Take 1 capsule (10 mg total) by mouth 2 (two) times daily before a meal. 60 capsule 0  . gabapentin (NEURONTIN) 300 MG capsule Take  3 caps in AM, 4 caps in PM 630 capsule 3  . HYDROcodone-acetaminophen (NORCO) 10-325 MG tablet Take 1 tablet by mouth every 4 (four) hours as needed for moderate pain. 90 tablet 0  . hydrocortisone (CORTEF) 10 MG tablet Take 10 mg by mouth See admin instructions. 1 tablet between 6-8am, 1/2 tablet at 11am, 1/2 tablet between 2-4pm daily, doubling dose on sick days     . levocetirizine (XYZAL) 5 MG tablet Take 1 tablet (5 mg total) by mouth every evening. 60 tablet 5  . LORazepam (ATIVAN) 1 MG tablet TAKE 1 TABLET BY MOUTH EVERY 8 HOURS AS NEEDED FOR ANXIETY OR SEIZURE 90 tablet 0  . metoprolol tartrate (LOPRESSOR) 25 MG tablet TAKE 1/2 TABLET TWICE A DAY AS NEEDED FOR PALPITATIONS 30 tablet 8  . montelukast (SINGULAIR) 10 MG tablet Take 1 tablet (10 mg total) by mouth at bedtime. 90 tablet 1  . promethazine (PHENERGAN) 25 MG tablet Take 25 mg every 6 (six) hours as needed by mouth for nausea or vomiting.      No current facility-administered medications for this visit.     PHYSICAL EXAMINATION: ECOG PERFORMANCE STATUS: 2 - Symptomatic, <50% confined to bed  Vitals:   06/15/17 1233  BP: 121/87  Pulse: 77  Resp: 18  Temp: 97.7 F (36.5 C)  SpO2: 100%   Filed Weights   06/15/17 1233  Weight: 141 lb 12.8 oz (64.3 kg)    GENERAL:alert, no distress and comfortable SKIN: skin color, texture, turgor are normal, no rashes or significant lesions EYES: normal, Conjunctiva are pink and non-injected, sclera clear Musculoskeletal:no cyanosis of digits and no clubbing  NEURO: alert & oriented x 3 with fluent speech, no focal motor/sensory deficits  LABORATORY DATA:  I have reviewed the data as listed    Component Value Date/Time   NA 140 06/15/2017 1207   K 3.9 06/15/2017 1207   CL 105 05/04/2017 1849   CO2 24 06/15/2017 1207   GLUCOSE 85 06/15/2017 1207   BUN 5.8 (L) 06/15/2017 1207   CREATININE 0.7 06/15/2017 1207   CALCIUM 8.5 06/15/2017 1207   PROT 6.7 06/15/2017 1207   ALBUMIN 3.9 06/15/2017 1207   AST 14 06/15/2017 1207   ALT 11 06/15/2017 1207   ALKPHOS 42 06/15/2017 1207   BILITOT 0.36 06/15/2017 1207   GFRNONAA >60 05/04/2017 1849   GFRAA >60 05/04/2017 1849    No results found for: SPEP, UPEP  Lab Results  Component Value Date   WBC 2.5 (L) 06/15/2017   NEUTROABS 1.4 (L) 06/15/2017   HGB 11.7 06/15/2017   HCT 34.4 (L) 06/15/2017   MCV 98.4 06/15/2017   PLT 199 06/15/2017      Chemistry      Component Value Date/Time   NA 140 06/15/2017 1207   K 3.9 06/15/2017 1207   CL 105 05/04/2017 1849   CO2 24 06/15/2017 1207   BUN 5.8 (L) 06/15/2017 1207   CREATININE 0.7 06/15/2017 1207      Component Value Date/Time   CALCIUM 8.5 06/15/2017 1207   ALKPHOS 42 06/15/2017 1207   AST 14 06/15/2017 1207   ALT 11 06/15/2017 1207   BILITOT 0.36 06/15/2017 1207      ASSESSMENT & PLAN:  3 cm grade II/IV oligodendroglioma of the right frontal brain with 1p19q  codeletion s/p subtotal resection I have reviewed extensive documentation by neurosurgeon at [facility] The patient will continue follow-up there with repeat imaging study in a few  months In the meantime, I will continue to provide active supportive care  Simple partial seizures The patient has poorly controlled seizures She saw her neurologist recently who had increased the dose of her Depakote and gabapentin The patient previously was taking lorazepam which also seemed to control her seizure, that along with soma I am concerned about prescribing both Marinol and Adderall because I perceive both of them as stimulants that could potentially aggravate her seizures She felt that the Marinol was able to control her nausea, improved appetite and I have been some studies where derivatives of marijuana can help control seizures I recommend discontinuation of alprazolam due to similar activity with lorazepam I also question medical compliance due to her forgetfulness I would defer to her neurologist for further management  Low serum cortisol level She has adrenal adenoma and she is prescribed steroids for adrenal insufficiency I would defer to her endocrinologist for further management  Intractable chronic cluster headache She has poorly controlled headaches She does not want to be taper off Fioricet I refilled her prescription hydrocodone to take as needed Again, the patient was inconsistency with her medications I recommend she stick with hydrocodone to 3 times a day and only take Fioricet as needed  Cognitive impairment She has cognitive impairment and poor memory She missed taking her medications as directed I recommend she set an alarm for reminder to take her medications and I enlist the help of her husband to ensure medical compliance  Asthma She has chronic chest congestion and shortness of breath The patient had history of asthma When she takes albuterol,  it aggravated her symptoms causing tachycardia I recommend she makes an appointment to see her pulmonologist/allergist for further management  Nausea without vomiting She has poor appetite and has chronic nausea After extensive discussion about medication management, I am willing to resume Marinol for her to take on a regular basis, 10 mg twice a day along with antiemetics as needed  Goals of care, counseling/discussion The patient and her husband are sad that they have been through many specialists who are not willing to provide adequate supportive care to address all her concerns I sympathize her situation I felt that if she is able to be consistent on her medication intake, she might be able to improve in the near future I think the priority is centered around seizure control I will continue to see her on a regular basis for medication management and supportive care   No orders of the defined types were placed in this encounter.  All questions were answered. The patient knows to call the clinic with any problems, questions or concerns. No barriers to learning was detected. I spent 40 minutes counseling the patient face to face. The total time spent in the appointment was 70 minutes and more than 50% was on counseling and review of test results     Heath Lark, MD 06/16/2017 5:54 PM

## 2017-06-16 NOTE — Assessment & Plan Note (Signed)
The patient has poorly controlled seizures She saw her neurologist recently who had increased the dose of her Depakote and gabapentin The patient previously was taking lorazepam which also seemed to control her seizure, that along with soma I am concerned about prescribing both Marinol and Adderall because I perceive both of them as stimulants that could potentially aggravate her seizures She felt that the Marinol was able to control her nausea, improved appetite and I have been some studies where derivatives of marijuana can help control seizures I recommend discontinuation of alprazolam due to similar activity with lorazepam I also question medical compliance due to her forgetfulness I would defer to her neurologist for further management

## 2017-06-16 NOTE — Assessment & Plan Note (Signed)
She has chronic chest congestion and shortness of breath The patient had history of asthma When she takes albuterol, it aggravated her symptoms causing tachycardia I recommend she makes an appointment to see her pulmonologist/allergist for further management

## 2017-06-16 NOTE — Assessment & Plan Note (Signed)
She has poor appetite and has chronic nausea After extensive discussion about medication management, I am willing to resume Marinol for her to take on a regular basis, 10 mg twice a day along with antiemetics as needed

## 2017-06-16 NOTE — Assessment & Plan Note (Signed)
She has adrenal adenoma and she is prescribed steroids for adrenal insufficiency I would defer to her endocrinologist for further management

## 2017-06-16 NOTE — Assessment & Plan Note (Signed)
The patient and her husband are sad that they have been through many specialists who are not willing to provide adequate supportive care to address all her concerns I sympathize her situation I felt that if she is able to be consistent on her medication intake, she might be able to improve in the near future I think the priority is centered around seizure control I will continue to see her on a regular basis for medication management and supportive care

## 2017-06-16 NOTE — Assessment & Plan Note (Signed)
She has poorly controlled headaches She does not want to be taper off Fioricet I refilled her prescription hydrocodone to take as needed Again, the patient was inconsistency with her medications I recommend she stick with hydrocodone to 3 times a day and only take Fioricet as needed

## 2017-06-16 NOTE — Assessment & Plan Note (Signed)
She has cognitive impairment and poor memory She missed taking her medications as directed I recommend she set an alarm for reminder to take her medications and I enlist the help of her husband to ensure medical compliance

## 2017-06-16 NOTE — Assessment & Plan Note (Signed)
I have reviewed extensive documentation by neurosurgeon at [facility] The patient will continue follow-up there with repeat imaging study in a few months In the meantime, I will continue to provide active supportive care

## 2017-06-20 ENCOUNTER — Ambulatory Visit (INDEPENDENT_AMBULATORY_CARE_PROVIDER_SITE_OTHER): Payer: Medicare Other | Admitting: Allergy and Immunology

## 2017-06-20 ENCOUNTER — Encounter: Payer: Self-pay | Admitting: Allergy and Immunology

## 2017-06-20 VITALS — BP 116/84 | HR 111 | Temp 98.2°F | Ht 69.0 in | Wt 135.0 lb

## 2017-06-20 DIAGNOSIS — R0609 Other forms of dyspnea: Secondary | ICD-10-CM | POA: Diagnosis not present

## 2017-06-20 DIAGNOSIS — J45991 Cough variant asthma: Secondary | ICD-10-CM

## 2017-06-20 DIAGNOSIS — R053 Chronic cough: Secondary | ICD-10-CM | POA: Insufficient documentation

## 2017-06-20 DIAGNOSIS — C719 Malignant neoplasm of brain, unspecified: Secondary | ICD-10-CM

## 2017-06-20 DIAGNOSIS — J4541 Moderate persistent asthma with (acute) exacerbation: Secondary | ICD-10-CM | POA: Diagnosis not present

## 2017-06-20 DIAGNOSIS — R05 Cough: Secondary | ICD-10-CM | POA: Diagnosis not present

## 2017-06-20 MED ORDER — FLUTICASONE PROPIONATE HFA 110 MCG/ACT IN AERO: 2.0000 | INHALATION_SPRAY | Freq: Two times a day (BID) | RESPIRATORY_TRACT | 5 refills | Status: DC

## 2017-06-20 MED FILL — DIVALPROEX SOD ER 250 MG TA: 250 | 90 days supply | Qty: 450 | Fill #0

## 2017-06-20 MED FILL — FLOVENT HFA 110 MCG INHALER: 110 | 30 days supply | Qty: 12 | Fill #0

## 2017-06-20 MED FILL — GABAPENTIN 300 MG CAPSULE: 300 | 90 days supply | Qty: 630 | Fill #0

## 2017-06-20 NOTE — Patient Instructions (Addendum)
Asthma Spirometry reveals a restrictive pattern with 220 mL postbronchodilator reversibility.  She did experience mild symptom reduction after receiving Xopenex and Atrovent via the nebulizer.  While she has some bronchial hyperresponsiveness, I do not believe that asthma alone accounts for her lower respiratory symptoms.  A prescription has been provided for Flovent 110 g, 2 inhalations via spacer device twice daily.  For now, and during respiratory tract infections or asthma flares, add Flovent 110g 3 inhalations 2 times per day until symptoms have returned to baseline.  Continue montelukast 10 mg daily bedtime and albuterol every 4-6 hours if needed.  A referral is been provided to pulmonology, Dr. Elsworth Soho, for further evaluation and treatment.  Cough, persistent  If the cough persists or progresses despite treatment plan as outlined above, consider adding presumptive treatment for postnasal drainage and/or acid reflux.   Pulmonary referral will dictate the necessity of follow-up in this office.

## 2017-06-20 NOTE — Assessment & Plan Note (Signed)
   If the cough persists or progresses despite treatment plan as outlined above, consider adding presumptive treatment for postnasal drainage and/or acid reflux.

## 2017-06-20 NOTE — Progress Notes (Addendum)
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  She was previously seen for her initial evaluation on March 01, 2016.  She reports that in September 2017 she had an anaphylactic reaction to chemotherapy and all chemotherapy was discontinued at that time.  She has not had chemotherapy over the past year.  She was diagnosed with secondary Addison's disease by her endocrinologist, Dr. Buddy Duty.  She complains today of persistent chest tightness, air hunger, a sensation of a weight on her chest, and a persistent dry cough.  She denies postnasal drainage.  She reports frequent eructation, but denies heartburn.  She experiences occasional mild swelling of her ankles, but states that this has been an issue for a long period of time and does not seem to have progressed.  On October 26 she had a chest CT at [facility] which revealed an 64mm groundglass nodule in right lower lobe.  No other cardiopulmonary abnormalities were noted. She will undergo a follow-up chest CT in 6 months.   Assessment and plan: Asthma Spirometry reveals a restrictive pattern with 220 mL postbronchodilator reversibility.  She did experience mild symptom reduction after receiving Xopenex and Atrovent via the nebulizer.  While she has some bronchial hyperresponsiveness, I do not believe that asthma alone accounts for her lower respiratory symptoms.  A prescription has been provided for Flovent 110 g, 2 inhalations via spacer device twice daily.  For now, and during respiratory tract infections or asthma flares, add Flovent 110g 3 inhalations 2 times per day until symptoms have returned to baseline.  Continue montelukast 10 mg daily bedtime and albuterol every 4-6  hours if needed.  A referral is been provided to pulmonology, Dr. Elsworth Soho, for further evaluation and treatment.  Cough, persistent  If the cough persists or progresses despite treatment plan as outlined above, consider adding presumptive treatment for postnasal drainage and/or acid reflux.   Meds ordered this encounter  Medications  . fluticasone (FLOVENT HFA) 110 MCG/ACT inhaler    Sig: Inhale 2 puffs into the lungs 2 (two) times daily.    Dispense:  1 Inhaler    Refill:  5    Diagnostics: Spirometry reveals an FVC of 2.67 L (60% predicted) and an FEV1 of 2.25 L (62% predicted) with 220 mL) postbronchodilator improvement.  Please see scanned spirometry results for details.    Physical examination: Blood pressure 116/84, pulse (!) 111, temperature 98.2 F (36.8 C), height 5\' 9"  (1.753 m), weight 135 lb (61.2 kg), last menstrual period 09/20/2012, SpO2 96 %.  General: Alert, interactive, in no acute distress. HEENT: TMs pearly gray, turbinates mildly edematous without discharge, post-pharynx erythematous. Neck: Supple without lymphadenopathy. Lungs: Mildly decreased breath sounds bilaterally without wheezing, rhonchi or rales. CV: Normal S1, S2 without murmurs. Skin: Warm and dry, without lesions or rashes.  The following portions of the patient's history were reviewed and updated as appropriate: allergies, current medications, past family history, past medical history, past social history, past surgical history and problem list.  Allergies as of 06/20/2017      Reactions   Adhesive [tape] Other (See Comments)   Skin tear   Latex Hives   Percocet [oxycodone-acetaminophen] Other (See Comments)   makes pt angry   Shellfish Allergy Diarrhea, Nausea And Vomiting  Skin Adhesives    Per patient states reaction to dermabond when had hysterectomy. Stated was red and irritated.    Soma [carisoprodol]    Watson brand, specifically, causes disorientation (can take other brands)    Tramadol Hcl Other (See Comments)   Potentially worsen seizure control   Vancomycin Hives   Zofran [ondansetron Hcl]    Pt. Stated side effects : abdominal pain / constipation       Medication List        Accurate as of 06/20/17  7:59 PM. Always use your most recent med list.          albuterol 108 (90 Base) MCG/ACT inhaler Commonly known as:  PROVENTIL HFA;VENTOLIN HFA Inhale 1-2 puffs into the lungs every 6 (six) hours as needed for wheezing or shortness of breath.   amphetamine-dextroamphetamine 10 MG 24 hr capsule Commonly known as:  ADDERALL XR Take 10 mg by mouth daily.   butalbital-acetaminophen-caffeine 50-325-40 MG tablet Commonly known as:  FIORICET, ESGIC TAKE 1 TABLET BY MOUTH EVERY 6 HOURS AS NEEDED FOR HEADACHE   carisoprodol 350 MG tablet Commonly known as:  SOMA TAKE 1 TABLET BY MOUTH 4 TIMES DAILY FOR SEIZURE PREVENTION   divalproex 250 MG 24 hr tablet Commonly known as:  DEPAKOTE ER Take 2 tablets (500 mg total) 2 (two) times daily by mouth. Take 2 tabs in AM, 3 tabs in PM   dronabinol 10 MG capsule Commonly known as:  MARINOL Take 1 capsule (10 mg total) by mouth 2 (two) times daily before a meal.   fluticasone 110 MCG/ACT inhaler Commonly known as:  FLOVENT HFA Inhale 2 puffs into the lungs 2 (two) times daily.   gabapentin 300 MG capsule Commonly known as:  NEURONTIN Take 3 caps in AM, 4 caps in PM   HYDROcodone-acetaminophen 10-325 MG tablet Commonly known as:  NORCO Take 1 tablet by mouth every 4 (four) hours as needed for moderate pain.   hydrocortisone 10 MG tablet Commonly known as:  CORTEF Take 10 mg by mouth See admin instructions. 1 tablet between 6-8am, 1/2 tablet at 11am, 1/2 tablet between 2-4pm daily, doubling dose on sick days   levocetirizine 5 MG tablet Commonly known as:  XYZAL Take 1 tablet (5 mg total) by mouth every evening.   LORazepam 1 MG tablet Commonly known as:  ATIVAN TAKE 1 TABLET BY MOUTH EVERY 8 HOURS AS  NEEDED FOR ANXIETY OR SEIZURE   metoprolol tartrate 25 MG tablet Commonly known as:  LOPRESSOR TAKE 1/2 TABLET TWICE A DAY AS NEEDED FOR PALPITATIONS   montelukast 10 MG tablet Commonly known as:  SINGULAIR Take 1 tablet (10 mg total) by mouth at bedtime.   promethazine 25 MG tablet Commonly known as:  PHENERGAN Take 25 mg every 6 (six) hours as needed by mouth for nausea or vomiting.   VITAMIN B-12 PO Take 5,000 Units daily by mouth.   Vitamin D 2000 units Caps Take 2,000 Units by mouth daily.       Allergies  Allergen Reactions  . Adhesive [Tape] Other (See Comments)    Skin tear  . Latex Hives  . Percocet [Oxycodone-Acetaminophen] Other (See Comments)    makes pt angry  . Shellfish Allergy Diarrhea and Nausea And Vomiting  . Skin Adhesives     Per patient states reaction to dermabond when had hysterectomy. Stated was red and irritated.   Manuela Neptune [Carisoprodol]     Watson brand, specifically, causes disorientation (can take other brands)  .  Tramadol Hcl Other (See Comments)    Potentially worsen seizure control  . Vancomycin Hives  . Zofran [Ondansetron Hcl]     Pt. Stated side effects : abdominal pain / constipation     I appreciate the opportunity to take part in Victoria Bates's care. Please do not hesitate to contact me with questions.  Sincerely,   R. Edgar Frisk, MD

## 2017-06-20 NOTE — Assessment & Plan Note (Addendum)
Spirometry reveals a restrictive pattern with 220 mL postbronchodilator reversibility.  She did experience mild symptom reduction after receiving Xopenex and Atrovent via the nebulizer.  While she has some bronchial hyperresponsiveness, I do not believe that asthma alone accounts for her lower respiratory symptoms.  A prescription has been provided for Flovent 110 g, 2 inhalations via spacer device twice daily.  For now, and during respiratory tract infections or asthma flares, add Flovent 110g 3 inhalations 2 times per day until symptoms have returned to baseline.  Continue montelukast 10 mg daily bedtime and albuterol every 4-6 hours if needed.  A referral is been provided to pulmonology, Dr. Elsworth Soho, for further evaluation and treatment.

## 2017-06-21 ENCOUNTER — Telehealth: Payer: Self-pay | Admitting: Allergy and Immunology

## 2017-06-21 NOTE — Telephone Encounter (Signed)
Patient came to see BOBBITT for dry cough yesterday Patient was given FLOVENT Patient took medication three times yesterday and then twice today Patient's cough has worsened and has become productive with green mucus Could this be caused by the medication Please call patient to answer any questions

## 2017-06-21 NOTE — Addendum Note (Signed)
Addended by: Lucrezia Starch I on: 06/21/2017 07:33 AM   Modules accepted: Orders

## 2017-06-21 NOTE — Telephone Encounter (Signed)
No, this would not be caused by the medication. If she becomes febrile with discolored mucus we can call in an antibiotic.

## 2017-06-21 NOTE — Telephone Encounter (Signed)
Patient states that last night she had an episode where she was having some problems breathing well. She denies fever, chills, body aches. States that she is just having the issue with the green sputum with her cough. She did use her albuterol inhaler last night but it did not offer any relief.

## 2017-06-22 NOTE — Telephone Encounter (Signed)
Spoke to patient advised as written per Dr Verlin Fester. Instructed patient how to use Flovent Hfa.

## 2017-06-23 ENCOUNTER — Telehealth: Payer: Self-pay

## 2017-06-23 ENCOUNTER — Other Ambulatory Visit: Payer: Self-pay

## 2017-06-23 MED ORDER — ALBUTEROL SULFATE HFA 108 (90 BASE) MCG/ACT IN AERS: 1.0000 | INHALATION_SPRAY | Freq: Four times a day (QID) | RESPIRATORY_TRACT | 0 refills | Status: DC | PRN

## 2017-06-23 MED FILL — PROAIR HFA 90 MCG INHALER: 108 (90 BAS | 25 days supply | Qty: 9 | Fill #0

## 2017-06-23 NOTE — Telephone Encounter (Signed)
Pt called stating that when she was seen yesterday, an RX for EpiPen was discussed, but not actually called out. She would like to have one on hand for emergencies. Also, she is absolutely no better. Still having shortness of breath, chest hurts, still coughing. If rx is call before 5, please send to [facility], if after 5 please call to [facility].

## 2017-06-27 MED ORDER — EPINEPHRINE 0.3 MG/0.3ML IJ SOAJ: 0.3000 mg | Freq: Once | INTRAMUSCULAR | 1 refills | Status: DC

## 2017-06-27 MED FILL — EPINEPHRINE 0.3 MG AUTO-INJ: 0.3 | 2 days supply | Qty: 2 | Fill #0

## 2017-06-27 NOTE — Telephone Encounter (Signed)
Prescription has been sent in to the pharmacy as requested.

## 2017-06-27 NOTE — Telephone Encounter (Signed)
Please call in a prescription for EpiPen 2 pack.  In addition, we will treat her cough presumptively for contribution from postnasal drainage and acid reflux.  Please send in a prescription for carbinoxamine 4 - 8 mg 2 or 3 times daily as needed. Also, please send in a script for ranitidine 150mg  bid. She should continue the Flovent for now. Thanks.

## 2017-06-29 ENCOUNTER — Other Ambulatory Visit: Payer: Self-pay | Admitting: Hematology and Oncology

## 2017-06-30 ENCOUNTER — Other Ambulatory Visit: Payer: Self-pay | Admitting: *Deleted

## 2017-06-30 MED ORDER — HYDROCODONE-ACETAMINOPHEN 10-325 MG PO TABS: 1.0000 | ORAL_TABLET | ORAL | 0 refills | Status: DC | PRN

## 2017-06-30 MED ORDER — BUTALBITAL-APAP-CAFFEINE 50-325-40 MG PO TABS: ORAL_TABLET | ORAL | 0 refills | Status: DC

## 2017-06-30 MED FILL — HYDROCODON-APAP 10-325: 10-325 | 15 days supply | Qty: 90 | Fill #0

## 2017-06-30 MED FILL — DRONABINOL 10 MG CAPS: 10 | 30 days supply | Qty: 60 | Fill #0

## 2017-06-30 MED FILL — BUTALB-ACETAMIN-CAFF 50-325: 50-325-40 | 23 days supply | Qty: 90 | Fill #0

## 2017-06-30 MED FILL — LORazepam 1 MG TABS: 1 | 30 days supply | Qty: 90 | Fill #0

## 2017-07-04 DIAGNOSIS — H40033 Anatomical narrow angle, bilateral: Secondary | ICD-10-CM | POA: Diagnosis not present

## 2017-07-04 DIAGNOSIS — H04123 Dry eye syndrome of bilateral lacrimal glands: Secondary | ICD-10-CM | POA: Diagnosis not present

## 2017-07-14 ENCOUNTER — Encounter: Payer: Self-pay | Admitting: Hematology and Oncology

## 2017-07-14 ENCOUNTER — Other Ambulatory Visit: Payer: Self-pay | Admitting: Hematology and Oncology

## 2017-07-14 ENCOUNTER — Telehealth: Payer: Self-pay

## 2017-07-14 MED ORDER — HYDROCODONE-ACETAMINOPHEN 10-325 MG PO TABS: 1.0000 | ORAL_TABLET | ORAL | 0 refills | Status: DC | PRN

## 2017-07-14 MED FILL — HYDROCODON-APAP 10-325: 10-325 | 15 days supply | Qty: 90 | Fill #0

## 2017-07-14 NOTE — Telephone Encounter (Signed)
Rx ready for pick up. 

## 2017-07-19 ENCOUNTER — Other Ambulatory Visit: Payer: Self-pay | Admitting: Hematology and Oncology

## 2017-07-19 DIAGNOSIS — E559 Vitamin D deficiency, unspecified: Secondary | ICD-10-CM

## 2017-07-19 DIAGNOSIS — E2740 Unspecified adrenocortical insufficiency: Secondary | ICD-10-CM

## 2017-07-19 DIAGNOSIS — R7989 Other specified abnormal findings of blood chemistry: Secondary | ICD-10-CM

## 2017-07-19 DIAGNOSIS — D61818 Other pancytopenia: Secondary | ICD-10-CM

## 2017-07-19 DIAGNOSIS — E538 Deficiency of other specified B group vitamins: Secondary | ICD-10-CM | POA: Insufficient documentation

## 2017-07-19 DIAGNOSIS — C711 Malignant neoplasm of frontal lobe: Secondary | ICD-10-CM

## 2017-07-21 ENCOUNTER — Ambulatory Visit: Payer: Medicare Other | Admitting: Hematology and Oncology

## 2017-07-21 ENCOUNTER — Telehealth: Payer: Self-pay | Admitting: Hematology and Oncology

## 2017-07-21 ENCOUNTER — Encounter: Payer: Self-pay | Admitting: Pulmonary Disease

## 2017-07-21 ENCOUNTER — Other Ambulatory Visit: Payer: Medicare Other

## 2017-07-21 NOTE — Telephone Encounter (Signed)
Gave avs and calendar for January  °

## 2017-07-24 ENCOUNTER — Other Ambulatory Visit: Payer: Self-pay | Admitting: Hematology and Oncology

## 2017-07-24 ENCOUNTER — Other Ambulatory Visit: Payer: Self-pay | Admitting: *Deleted

## 2017-07-24 DIAGNOSIS — R519 Headache, unspecified: Secondary | ICD-10-CM

## 2017-07-24 DIAGNOSIS — R110 Nausea: Secondary | ICD-10-CM

## 2017-07-24 DIAGNOSIS — R0789 Other chest pain: Secondary | ICD-10-CM

## 2017-07-24 DIAGNOSIS — R202 Paresthesia of skin: Secondary | ICD-10-CM

## 2017-07-24 DIAGNOSIS — R51 Headache: Secondary | ICD-10-CM

## 2017-07-24 DIAGNOSIS — R4702 Dysphasia: Secondary | ICD-10-CM

## 2017-07-24 DIAGNOSIS — C711 Malignant neoplasm of frontal lobe: Secondary | ICD-10-CM

## 2017-07-24 MED ORDER — LORAZEPAM 1 MG PO TABS: ORAL_TABLET | ORAL | 0 refills | Status: DC

## 2017-07-24 MED ORDER — CARISOPRODOL 350 MG PO TABS: ORAL_TABLET | ORAL | 1 refills | Status: DC

## 2017-07-24 MED ORDER — DRONABINOL 10 MG PO CAPS: 10.0000 mg | ORAL_CAPSULE | Freq: Two times a day (BID) | ORAL | 0 refills | Status: DC

## 2017-07-24 MED ORDER — BUTALBITAL-APAP-CAFFEINE 50-325-40 MG PO TABS: ORAL_TABLET | ORAL | 0 refills | Status: DC

## 2017-07-24 NOTE — Telephone Encounter (Signed)
Husband notified that prescriptions will be available for pick up on Tuesday at [facility]

## 2017-07-25 ENCOUNTER — Encounter: Payer: Self-pay | Admitting: Hematology and Oncology

## 2017-07-25 MED FILL — BUTALB-ACETAMIN-CAFF 50-325: 50-325-40 | 22 days supply | Qty: 90 | Fill #0

## 2017-07-25 MED FILL — LORazepam 1 MG TABS: 1 | 30 days supply | Qty: 90 | Fill #0

## 2017-07-25 NOTE — Progress Notes (Signed)
Submitted auth request for Dronabinol today.  Status is pending.  °

## 2017-07-26 ENCOUNTER — Institutional Professional Consult (permissible substitution): Payer: Medicare Other | Admitting: Pulmonary Disease

## 2017-07-27 ENCOUNTER — Telehealth: Payer: Self-pay

## 2017-07-27 NOTE — Telephone Encounter (Signed)
Called and told her that insurance will not pay for Marinol Rx.  She going to call her insurance company and call us back after doing a little research.

## 2017-07-27 NOTE — Telephone Encounter (Signed)
Victoria Bates called and left message that she called insurance company. They are going to approve Marinol, they will fax approval. When we get the fax, she would like to be called.

## 2017-07-28 ENCOUNTER — Other Ambulatory Visit: Payer: Self-pay | Admitting: *Deleted

## 2017-07-28 ENCOUNTER — Encounter: Payer: Self-pay | Admitting: Hematology and Oncology

## 2017-07-28 MED ORDER — HYDROCODONE-ACETAMINOPHEN 10-325 MG PO TABS: 1.0000 | ORAL_TABLET | ORAL | 0 refills | Status: DC | PRN

## 2017-07-28 MED FILL — DRONABINOL 10 MG CAPS: 10 | 30 days supply | Qty: 60 | Fill #0

## 2017-07-28 MED FILL — HYDROCODON-APAP 10-325: 10-325 | 15 days supply | Qty: 90 | Fill #0

## 2017-07-28 NOTE — Progress Notes (Signed)
Pt's  Dronabinol was approved from 07/16/17 to 01/24/18.

## 2017-07-31 ENCOUNTER — Encounter: Payer: Self-pay | Admitting: Hematology and Oncology

## 2017-07-31 NOTE — Progress Notes (Signed)
Submitted auth request for Hydrocodone today.  Status is pending.

## 2017-07-31 NOTE — Progress Notes (Signed)
Pt's Hydrocodone was approved from 07/16/17 to 07/17/18.

## 2017-08-10 ENCOUNTER — Encounter: Payer: Self-pay | Admitting: Hematology and Oncology

## 2017-08-10 ENCOUNTER — Inpatient Hospital Stay: Payer: Medicare Other

## 2017-08-10 ENCOUNTER — Telehealth: Payer: Self-pay | Admitting: Hematology and Oncology

## 2017-08-10 ENCOUNTER — Inpatient Hospital Stay: Payer: Medicare Other | Attending: Hematology and Oncology

## 2017-08-10 ENCOUNTER — Inpatient Hospital Stay (HOSPITAL_BASED_OUTPATIENT_CLINIC_OR_DEPARTMENT_OTHER): Payer: Medicare Other | Admitting: Hematology and Oncology

## 2017-08-10 DIAGNOSIS — R110 Nausea: Secondary | ICD-10-CM | POA: Insufficient documentation

## 2017-08-10 DIAGNOSIS — G893 Neoplasm related pain (acute) (chronic): Secondary | ICD-10-CM | POA: Diagnosis not present

## 2017-08-10 DIAGNOSIS — G629 Polyneuropathy, unspecified: Secondary | ICD-10-CM | POA: Insufficient documentation

## 2017-08-10 DIAGNOSIS — E538 Deficiency of other specified B group vitamins: Secondary | ICD-10-CM

## 2017-08-10 DIAGNOSIS — C711 Malignant neoplasm of frontal lobe: Secondary | ICD-10-CM | POA: Insufficient documentation

## 2017-08-10 DIAGNOSIS — E559 Vitamin D deficiency, unspecified: Secondary | ICD-10-CM

## 2017-08-10 DIAGNOSIS — D61818 Other pancytopenia: Secondary | ICD-10-CM

## 2017-08-10 DIAGNOSIS — G44021 Chronic cluster headache, intractable: Secondary | ICD-10-CM | POA: Diagnosis not present

## 2017-08-10 DIAGNOSIS — R112 Nausea with vomiting, unspecified: Secondary | ICD-10-CM

## 2017-08-10 DIAGNOSIS — Z79899 Other long term (current) drug therapy: Secondary | ICD-10-CM

## 2017-08-10 DIAGNOSIS — R51 Headache: Secondary | ICD-10-CM | POA: Insufficient documentation

## 2017-08-10 LAB — CBC WITH DIFFERENTIAL/PLATELET
Basophils Absolute: 0 10*3/uL (ref 0.0–0.1)
Basophils Relative: 1 %
Eosinophils Absolute: 0.1 10*3/uL (ref 0.0–0.5)
Eosinophils Relative: 1 %
HCT: 34.6 % — ABNORMAL LOW (ref 34.8–46.6)
Hemoglobin: 11.7 g/dL (ref 11.6–15.9)
Lymphocytes Relative: 25 %
Lymphs Abs: 1.1 10*3/uL (ref 0.9–3.3)
MCH: 32.9 pg (ref 25.1–34.0)
MCHC: 33.8 g/dL (ref 31.5–36.0)
MCV: 97.2 fL (ref 79.5–101.0)
Monocytes Absolute: 0.4 10*3/uL (ref 0.1–0.9)
Monocytes Relative: 9 %
Neutro Abs: 2.8 10*3/uL (ref 1.5–6.5)
Neutrophils Relative %: 64 %
Platelets: 196 10*3/uL (ref 145–400)
RBC: 3.56 MIL/uL — ABNORMAL LOW (ref 3.70–5.45)
RDW: 11.7 % (ref 11.2–16.1)
WBC: 4.4 10*3/uL (ref 3.9–10.3)

## 2017-08-10 LAB — VITAMIN B12: Vitamin B-12: 819 pg/mL (ref 180–914)

## 2017-08-10 LAB — COMPREHENSIVE METABOLIC PANEL
ALT: 9 U/L (ref 0–55)
AST: 12 U/L (ref 5–34)
Albumin: 3.8 g/dL (ref 3.5–5.0)
Alkaline Phosphatase: 46 U/L (ref 40–150)
Anion gap: 7 (ref 3–11)
BUN: 8 mg/dL (ref 7–26)
CO2: 25 mmol/L (ref 22–29)
Calcium: 8.6 mg/dL (ref 8.4–10.4)
Chloride: 106 mmol/L (ref 98–109)
Creatinine, Ser: 0.68 mg/dL (ref 0.60–1.10)
GFR calc Af Amer: >60 mL/min (ref >60)
GFR calc non Af Amer: >60 mL/min (ref >60)
Glucose, Bld: 89 mg/dL (ref 70–140)
Potassium: 4.3 mmol/L (ref 3.3–4.7)
Sodium: 138 mmol/L (ref 136–145)
Total Bilirubin: <0.2 mg/dL — ABNORMAL LOW (ref 0.2–1.2)
Total Protein: 6.6 g/dL (ref 6.4–8.3)

## 2017-08-10 MED ORDER — HYDROCODONE-ACETAMINOPHEN 10-325 MG PO TABS: 1.0000 | ORAL_TABLET | ORAL | 0 refills | Status: DC | PRN

## 2017-08-10 MED ORDER — BUTALBITAL-APAP-CAFFEINE 50-325-40 MG PO TABS: ORAL_TABLET | ORAL | 0 refills | Status: DC

## 2017-08-10 MED ORDER — SODIUM CHLORIDE 0.9 % IJ SOLN
10.0000 mL | INTRAMUSCULAR | Status: DC | PRN
Start: 2017-08-10 — End: 2017-08-10
  Administered 2017-08-10: 10 mL via INTRAVENOUS
  Filled 2017-08-10: qty 10

## 2017-08-10 MED ORDER — HEPARIN SOD (PORK) LOCK FLUSH 100 UNIT/ML IV SOLN
500.0000 [IU] | Freq: Once | INTRAVENOUS | Status: AC | PRN
  Administered 2017-08-10: 500 [IU] via INTRAVENOUS
  Filled 2017-08-10: qty 5

## 2017-08-10 MED FILL — HYDROCODON-APAP 10-325: 10-325 | 15 days supply | Qty: 90 | Fill #0

## 2017-08-10 NOTE — Telephone Encounter (Signed)
Gave patient AVs and calendar of upcoming March appointments. °

## 2017-08-10 NOTE — Assessment & Plan Note (Signed)
I have reviewed extensive documentation by neurosurgeon at [facility] The patient will continue follow-up there with repeat imaging study in a few months In the meantime, I will continue to provide active supportive care

## 2017-08-10 NOTE — Assessment & Plan Note (Signed)
Her nausea is well controlled with antiemetics. Marinol appears to help with her nausea as well.  We will continue the same

## 2017-08-10 NOTE — Assessment & Plan Note (Addendum)
She has poorly controlled headaches She is taking hydrocodone and Fioricet as needed She is also taking gabapentin for neuropathic pain She is wondering whether that is something else to take for this I recommend consultation with a neurologist.  I am not sure whether that could be other form of injection or other prescription medications that could help to treat her headaches

## 2017-08-10 NOTE — Progress Notes (Signed)
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      09/27/2013 Pathology Results    Accession: [id number] pathology showed grade 2 oligodendroglioma with mutant IDH1 protein, ATRX, OLIG 2 expression. p53 is negative, low Ki-67 labeling index. 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      11/06/2013 Imaging    Repeat CT scan of the head showed resolution of abscess.      01/23/2014 Surgery    Dr. Christella Noa perform a subtotal gross resection of the tumor.      01/24/2014 Imaging    Repeat imaging studies showed persistent and residual disease.      03/03/2014 - 09/01/2014 Chemotherapy    She was started on Temodar, 5 days every 28 days      03/13/2014 Imaging    Repeat MRI showed no evidence of progression of disease.      07/14/2014 Imaging    Repeat MRI showed no evidence of disease progression.      09/12/2014 Imaging    MRI of the liver show mild regression of the size of lesions, presumed to be benign      09/15/2014 Imaging    Repeat MRI of the brain show persistent abnormalities, suspicious for residual disease      03/02/2015 Imaging     MRI brain at [facility] show persistent abnormalities      08/10/2015 Imaging    Repeat MRI [facility] showed stable disease      08/24/2015 - 02/08/2016 Chemotherapy     She was started on Lomustine, Procarbazine and Vincristine chemotherapy      08/30/2015 Imaging    CT chest showed no evidence of aspiration pneumonia      11/18/2015 Imaging    MRI brain showed status post resection of right frontal lobe tumor without residual or recurrent tumor. 2. Otherwise negative MRI of the brain.       03/02/2016 Imaging    Stable MRI post right frontal lobe tumor resection. Negative for recurrent tumor      06/01/2016 Imaging    Stable MRI post right frontal lobe tumor resection. No recurrent tumor identified      08/19/2016 Imaging    Unchanged examination without tumor recurrence.      11/17/2016 Imaging    Stable right frontal resection cavity. Stable focal mass effect within the right middle frontal gyrus at the superior margin of the cavity which may represent nonenhancing neoplasm. No evidence for tumor progression. Otherwise stable MRI of the brain      12/16/2016 Imaging    MR brain: 1. No acute intracranial abnormality. 2. Unchanged appearance of right frontal lobe resection cavity and surrounding hyperintense T2 weighted signal.      02/04/2017 Imaging    Repeat MRI Normal pituitary  Stable postop changes of right frontal tumor resection      05/12/2017 Imaging  The left ventricular size is normal. There is normal left ventricular wall thickness.  Left ventricular systolic function is normal. LV ejection fraction = 60-65%.  Left ventricular filling pattern is normal. The left ventricular wall motion is normal. The right ventricle is normal in size and function. There is no significant valvular stenosis or regurgitation No pulmonary hypertension. IVC size was normal. There is no pericardial effusion. There is no comparison study available      05/13/2017 Imaging    1.No evidence of acute pulmonary thromboembolic disease. 2.There is an 8 mm right lower lobe groundglass nodule. Statistically, in this age group, this is most  likely to reflect benign etiologies such as a tiny focus of infection or nonspecific inflammation. 4 mm solid right upper lobe lung nodule. A CT scan could be obtained in 12 months to further evaluate if the patient has any known risk factors for malignancy. 3.There is a 1.3 cm left adrenal nodule. This finding is incompletely characterized although and is slightly increased in size relative to the abdominal MRI from February 2016. Although this finding is most likely benign, recommend follow-up adrenal protocol CT in one year.      05/13/2017 Imaging    MRI brain 1.No acute intracranial abnormality. 2.Postsurgical changes of a right frontal craniotomy for resection of a reported oligodendroglioma. Hyperintense T2/FLAIR signal around the resection cavity and in the subcortical white matter of a gyrus along the superior margin of the cavity is unchanged from 01/05/2015. No evidence of disease progression.       INTERVAL HISTORY: Please see below for problem oriented charting. She returns with her husband for further follow-up She continues to have nausea, chronic headaches and seizures Her pain is suboptimally controlled with current prescription hydrocodone and Fioricet At times, her headaches are very severe She denies worsening neurological deficit She denies recent infection She is taking her medications correctly but needed a lot of help due to poor memory  REVIEW OF SYSTEMS:   Constitutional: Denies fevers, chills or abnormal weight loss Eyes: Denies blurriness of vision Ears, nose, mouth, throat, and face: Denies mucositis or sore throat Respiratory: Denies cough, dyspnea or wheezes Cardiovascular: Denies palpitation, chest discomfort or lower extremity swelling Skin: Denies abnormal skin rashes Lymphatics: Denies new lymphadenopathy or easy bruising Neurological:Denies numbness, tingling or new weaknesses All other systems were reviewed with the patient and are  negative.  I have reviewed the past medical history, past surgical history, social history and family history with the patient and they are unchanged from previous note.  ALLERGIES:  is allergic to adhesive [tape]; latex; percocet [oxycodone-acetaminophen]; shellfish allergy; skin adhesives; soma [carisoprodol]; tramadol hcl; vancomycin; and zofran [ondansetron hcl].  MEDICATIONS:  Current Outpatient Medications  Medication Sig Dispense Refill  . butalbital-acetaminophen-caffeine (FIORICET, ESGIC) 50-325-40 MG tablet TAKE 1 TABLET BY MOUTH EVERY 6 HOURS AS NEEDED FOR HEADACHE 90 tablet 0  . carisoprodol (SOMA) 350 MG tablet TAKE 1 TABLET BY MOUTH 4 TIMES DAILY FOR SEIZURE PREVENTION 360 tablet 1  . Cholecalciferol (VITAMIN D) 2000 units CAPS Take 2,000 Units by mouth daily.    . Cyanocobalamin (VITAMIN B-12 PO) Take 5,000 Units daily by mouth.     . divalproex (DEPAKOTE ER) 250 MG 24 hr tablet Take 2 tablets (500 mg total) 2 (two) times daily by mouth. Take 2 tabs in AM, 3 tabs in PM 450 tablet 3  . dronabinol (MARINOL) 10 MG capsule Take 1 capsule (10 mg total) by mouth 2 (two) times daily before a  meal. 60 capsule 0  . gabapentin (NEURONTIN) 300 MG capsule Take 3 caps in AM, 4 caps in PM 630 capsule 3  . HYDROcodone-acetaminophen (NORCO) 10-325 MG tablet Take 1 tablet by mouth every 4 (four) hours as needed for moderate pain. 90 tablet 0  . hydrocortisone (CORTEF) 10 MG tablet Take 10 mg by mouth See admin instructions. 1 tablet between 6-8am, 1/2 tablet at 11am, 1/2 tablet between 2-4pm daily, doubling dose on sick days     . levocetirizine (XYZAL) 5 MG tablet Take 1 tablet (5 mg total) by mouth every evening. 60 tablet 5  . LORazepam (ATIVAN) 1 MG tablet TAKE 1 TABLET BY MOUTH EVERY 8 HOURS AS NEEDED FOR ANXIETY OR SEIZURE 90 tablet 0  . montelukast (SINGULAIR) 10 MG tablet Take 1 tablet (10 mg total) by mouth at bedtime. 90 tablet 1  . promethazine (PHENERGAN) 25 MG tablet Take 25 mg every  6 (six) hours as needed by mouth for nausea or vomiting.     No current facility-administered medications for this visit.     PHYSICAL EXAMINATION: ECOG PERFORMANCE STATUS: 1 - Symptomatic but completely ambulatory  Vitals:   08/10/17 1310  BP: 126/85  Pulse: (!) 108  Resp: 18  Temp: 98.4 F (36.9 C)  SpO2: 100%   Filed Weights   08/10/17 1310  Weight: 139 lb (63 kg)    GENERAL:alert, no distress and comfortable SKIN: skin color, texture, turgor are normal, no rashes or significant lesions EYES: normal, Conjunctiva are pink and non-injected, sclera clear OROPHARYNX:no exudate, no erythema and lips, buccal mucosa, and tongue normal  NECK: supple, thyroid normal size, non-tender, without nodularity LYMPH:  no palpable lymphadenopathy in the cervical, axillary or inguinal LUNGS: clear to auscultation and percussion with normal breathing effort HEART: regular rate & rhythm and no murmurs and no lower extremity edema ABDOMEN:abdomen soft, non-tender and normal bowel sounds Musculoskeletal:no cyanosis of digits and no clubbing  NEURO: alert & oriented x 3 with occasional dysarthria, no focal motor/sensory deficits  LABORATORY DATA:  I have reviewed the data as listed    Component Value Date/Time   NA 138 08/10/2017 1201   NA 140 06/15/2017 1207   K 4.3 08/10/2017 1201   K 3.9 06/15/2017 1207   CL 106 08/10/2017 1201   CO2 25 08/10/2017 1201   CO2 24 06/15/2017 1207   GLUCOSE 89 08/10/2017 1201   GLUCOSE 85 06/15/2017 1207   BUN 8 08/10/2017 1201   BUN 5.8 (L) 06/15/2017 1207   CREATININE 0.68 08/10/2017 1201   CREATININE 0.7 06/15/2017 1207   CALCIUM 8.6 08/10/2017 1201   CALCIUM 8.5 06/15/2017 1207   PROT 6.6 08/10/2017 1201   PROT 6.7 06/15/2017 1207   ALBUMIN 3.8 08/10/2017 1201   ALBUMIN 3.9 06/15/2017 1207   AST 12 08/10/2017 1201   AST 14 06/15/2017 1207   ALT 9 08/10/2017 1201   ALT 11 06/15/2017 1207   ALKPHOS 46 08/10/2017 1201   ALKPHOS 42 06/15/2017  1207   BILITOT <0.2 (L) 08/10/2017 1201   BILITOT 0.36 06/15/2017 1207   GFRNONAA >60 08/10/2017 1201   GFRAA >60 08/10/2017 1201    No results found for: SPEP, UPEP  Lab Results  Component Value Date   WBC 4.4 08/10/2017   NEUTROABS 2.8 08/10/2017   HGB 11.7 08/10/2017   HCT 34.6 (L) 08/10/2017   MCV 97.2 08/10/2017   PLT 196 08/10/2017      Chemistry      Component Value  Date/Time   NA 138 08/10/2017 1201   NA 140 06/15/2017 1207   K 4.3 08/10/2017 1201   K 3.9 06/15/2017 1207   CL 106 08/10/2017 1201   CO2 25 08/10/2017 1201   CO2 24 06/15/2017 1207   BUN 8 08/10/2017 1201   BUN 5.8 (L) 06/15/2017 1207   CREATININE 0.68 08/10/2017 1201   CREATININE 0.7 06/15/2017 1207      Component Value Date/Time   CALCIUM 8.6 08/10/2017 1201   CALCIUM 8.5 06/15/2017 1207   ALKPHOS 46 08/10/2017 1201   ALKPHOS 42 06/15/2017 1207   AST 12 08/10/2017 1201   AST 14 06/15/2017 1207   ALT 9 08/10/2017 1201   ALT 11 06/15/2017 1207   BILITOT <0.2 (L) 08/10/2017 1201   BILITOT 0.36 06/15/2017 1207       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  I am not sure whether that could be other form of injection or other prescription medications that could help to treat her headaches  Nausea & vomiting Her nausea is well controlled with antiemetics. Marinol appears to help with  her nausea as well.  We will continue the same   No orders of the defined types were placed in this encounter.  All questions were answered. The patient knows to call the clinic with any problems, questions or concerns. No barriers to learning was detected. I spent 15 minutes counseling the patient face to face. The total time spent in the appointment was 20 minutes and more than 50% was on counseling and review of test results     Heath Lark, MD 08/10/2017 2:09 PM

## 2017-08-11 LAB — VITAMIN D 25 HYDROXY (VIT D DEFICIENCY, FRACTURES): Vit D, 25-Hydroxy: 18.2 ng/mL — ABNORMAL LOW (ref 30.0–100.0)

## 2017-08-11 MED FILL — BUTALB-ACETAMIN-CAFF 50-325: 50-325-40 | 22 days supply | Qty: 90 | Fill #0

## 2017-08-14 ENCOUNTER — Telehealth: Payer: Self-pay | Admitting: *Deleted

## 2017-08-14 NOTE — Telephone Encounter (Signed)
-----   Message from Heath Lark, MD sent at 08/11/2017  7:32 AM EST ----- Regarding: vitamin D level pls let her know vitamin D level is low Increase vitamin D to at least 2000 units per day. B12 level is ok ----- Message ----- From: Interface, Lab In [place] Sent: 08/10/2017   1:08 PM To: Heath Lark, MD

## 2017-08-14 NOTE — Telephone Encounter (Signed)
LM with note below 

## 2017-08-18 ENCOUNTER — Ambulatory Visit (INDEPENDENT_AMBULATORY_CARE_PROVIDER_SITE_OTHER): Payer: Medicare Other | Admitting: Pulmonary Disease

## 2017-08-18 ENCOUNTER — Encounter: Payer: Self-pay | Admitting: Pulmonary Disease

## 2017-08-18 DIAGNOSIS — J45991 Cough variant asthma: Secondary | ICD-10-CM

## 2017-08-18 DIAGNOSIS — R0602 Shortness of breath: Secondary | ICD-10-CM

## 2017-08-18 DIAGNOSIS — R918 Other nonspecific abnormal finding of lung field: Secondary | ICD-10-CM | POA: Insufficient documentation

## 2017-08-18 NOTE — Progress Notes (Signed)
Subjective:    Patient ID: Victoria Bates, female    DOB: 02-21-1981, 37 y.o.   MRN: [id number]  HPI Chief Complaint  Patient presents with  . Pulm Consult    Referred by Dr. Verlin Fester for SOB and chest discomfort.     37 year old never smoker with glioma presents for evaluation of lung nodules and dyspnea. 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  She then underwent chemotherapy.  Since then she has been diagnosed with a left adrenal nodule, adrenal insufficiency for which she sees Dr. Buddy Duty  and is maintained on replacement corticosteroids. She also has a history of ray nods syndrome and she reports that her hands and feet turn pale and purple with exposure to cold.  She has had an abnormal swallowing study before.  She has not been formally diagnosed with a collagen vascular disease, reports joint stiffness in the mornings she does state that she had some blood work done with [facility], and was told that maybe she had a positive RNP but cannot give me any more details.  She has never seen a rheumatologist She reports episodic dyspnea especially with heat and exertion.  She reports chest heaviness radiating to her back associated with shortness of breath for which she had 2 ER visits in October 2018 and CT angiogram was performed.  One performed at [facility] ER was negative but one performed at [facility] did show nodule 8 mm nodule in the right lower lobe and 4 mm in the right upper lobe.  She has been evaluated in the past in our office for asthma and felt not to have this.  She was seen by allergist and spirometry was performed which I reviewed.  Albuterol makes her shaky.  She was also given Flovent which she did not take due to side effects.  She denies pedal edema but does report a 6 pillow orthopnea, and feels that she is able to sleep in this position She reports one such episode where her extremities turned purple and she had severe chest pain  or shortness of breath and this was reviewed with her husband gave her epinephrine intramuscularly and wonders why this should be so  She has been evaluated by cardiology and felt to have orthostatic hypotension, Holter for 48 hours was negative but she does have persistent sinus tachycardia    Significant tests/ events reviewed  Echo 01/2017-RVSP 32  CT angiogram 04/2017 negative pulmonary embolism CT chest with contrast at [facility] lower lobe 8 mm nodule in the right upper lobe 4 mm nodule, left adrenal 1.3 cm nodule  Spirometry 06/2017-ratio 88, FEV1 68%, FVC 63%-moderate restriction PFTs 12/2015-ratio 84, FEV1 79%, FVC 78%, TLC 87%, DLCO 59% corrects to 77% for alveolar volume   Past Medical History:  Diagnosis Date  . Anemia   . Anxiety   . Asthma    very mild- no inhaler use since July 2013  . Brain tumor ([place])    Hx: of  . Cancer ([place]) 2003   skin  . Chronic fatigue 10/03/2014  . Cognitive impairment 08/13/2015  . Complication of anesthesia    takes longer for patient to go under anesthesia  . Dizziness   . Dysphasia 02/12/2014  . Glioma, malignant ([place]) dx'd 09/2013  . Headache 02/12/2014  . Headache(784.0)    migraines  . Herniated disc   . Hot flashes 10/03/2014  . Infection    to right head incision  . Lactation  disorder 10/03/2014  . Liver hemangioma   . Liver lesion 08/22/2014  . Nausea alone 02/12/2014  . Neuropathic pain 04/08/2014  . Petit mal seizure status ([place])   . Seizures ([place])    petit mal serizures most days    Past Surgical History:  Procedure Laterality Date  . ABDOMINAL HYSTERECTOMY    . CESAREAN SECTION    . COLONOSCOPY WITH PROPOFOL N/A 08/29/2012   Procedure: COLONOSCOPY WITH PROPOFOL;  Surgeon: Arta Silence, MD;  Location: [facility];  Service: Endoscopy;  Laterality: N/A;  . coposcopy  08/06/2012   cervical biopsy  . CRANIOTOMY Right 09/26/2013   Procedure: Awake Right frontal open brain biopsy;  Surgeon: Winfield Cunas, MD;   Location: [facility];  Service: Neurosurgery;  Laterality: Right;  awake Right frontal open brain biopsy  . CRANIOTOMY N/A 10/30/2013   Procedure: debridement of cranial wound  right temporal incision, ;  Surgeon: Winfield Cunas, MD;  Location: [place] [facility] [place];  Service: Neurosurgery;  Laterality: N/A;  . CRANIOTOMY N/A 01/23/2014   Procedure: Awake Craniotomy with BrainLab;  Surgeon: Winfield Cunas, MD;  Location: [place] [facility];  Service: Neurosurgery;  Laterality: N/A;  Awake Craniotomy  tumor with BrainLab  . ESOPHAGOGASTRODUODENOSCOPY (EGD) WITH PROPOFOL N/A 08/29/2012   Procedure: ESOPHAGOGASTRODUODENOSCOPY (EGD) WITH PROPOFOL;  Surgeon: Arta Silence, MD;  Location: [facility];  Service: Endoscopy;  Laterality: N/A;  . left knee surgery  2000   torn meniscus  . right flank surgery  2005   bnign mass from flank area-right  . ROBOTIC ASSISTED TOTAL HYSTERECTOMY WITH BILATERAL SALPINGO OOPHERECTOMY Bilateral 09/25/2012   Procedure: ROBOTIC ASSISTED TOTAL HYSTERECTOMY WITH BILATERAL SALPINGO OOPHORECTOMY;  Surgeon: Maeola Sarah. Landry Mellow, MD;  Location: [facility];  Service: Gynecology;  Laterality: Bilateral;  . ROTATOR CUFF REPAIR    . WISDOM TOOTH EXTRACTION     Allergies  Allergen Reactions  . Adhesive [Tape] Other (See Comments)    Skin tear  . Latex Hives  . Percocet [Oxycodone-Acetaminophen] Other (See Comments)    makes pt angry  . Shellfish Allergy Diarrhea and Nausea And Vomiting  . Skin Adhesives     Per patient states reaction to dermabond when had hysterectomy. Stated was red and irritated.   Manuela Neptune [Carisoprodol]     Watson brand, specifically, causes disorientation (can take other brands)  . Tramadol Hcl Other (See Comments)    Potentially worsen seizure control  . Vancomycin Hives  . Zofran [Ondansetron Hcl]     Pt. Stated side effects : abdominal pain / constipation      Social History   Socioeconomic History  . Marital status: Single    Spouse name: Not on file  . Number of  children: 2  . Years of education: College  . Highest education level: Not on file  Social Needs  . Financial resource strain: Not on file  . Food insecurity - worry: Not on file  . Food insecurity - inability: Not on file  . Transportation needs - medical: Not on file  . Transportation needs - non-medical: Not on file  Occupational History  . Occupation: Engineer, mining: [employer]  Tobacco Use  . Smoking status: Never Smoker  . Smokeless tobacco: Never Used  Substance and Sexual Activity  . Alcohol use: No    Alcohol/week: 0.0 oz  . Drug use: No  . Sexual activity: Yes    Birth control/protection: None  Other Topics Concern  . Not on file  Social History Narrative   Patient lives at home with her two children.   Caffeine use: minimum of 4 cups daily    Family History  Problem Relation Age of Onset  . Hypertension Mother   . Skin cancer Mother   . Thyroid cancer Father   . Skin cancer Father   . Breast cancer Maternal Aunt        dx in her 8s  . Prostate cancer Maternal Uncle 63  . Melanoma Maternal Uncle   . Ovarian cancer Maternal Grandmother        dx in her late 59s-40s  . Breast cancer Maternal Aunt        bilateral breast cancer dx in 1s and 63s; possibly BRCA+  . Other Son        multiple CAL spots, bright spots on brain MRI, ? chiari malformation     Review of Systems Positive for chest pain and shortness of breath as well, stiffness in joints, Raynaud's phenomenon  Constitutional: negative for anorexia, fevers and sweats  Eyes: negative for irritation, redness and visual disturbance  Ears, nose, mouth, throat, and face: negative for earaches, epistaxis, nasal congestion and sore throat  Respiratory: negative for cough,  sputum and wheezing  Cardiovascular: negative forlower extremity edema, orthopnea, palpitations and syncope  Gastrointestinal: negative for abdominal pain, constipation, diarrhea, melena, nausea and vomiting    Genitourinary:negative for dysuria, frequency and hematuria  Hematologic/lymphatic: negative for bleeding, easy bruising and lymphadenopathy  Musculoskeletal:negative for arthralgias, muscle weakness  Neurological: negative for coordination problems, gait problems, headaches and weakness  Endocrine: negative for diabetic symptoms including polydipsia, polyuria and weight loss     Objective:   Physical Exam  Gen. Pleasant, well-nourished, in no distress, normal affect ENT - no lesions, no post nasal drip Neck: No JVD, no thyromegaly, no carotid bruits Lungs: no use of accessory muscles, no dullness to percussion, clear without rales or rhonchi  Cardiovascular: Rhythm regular,tachy,  heart sounds  normal, no murmurs or gallops, no peripheral edema Abdomen: soft and non-tender, no hepatosplenomegaly, BS normal. Musculoskeletal: No deformities, no cyanosis or clubbing Neuro:  alert, non focal       Assessment & Plan:

## 2017-08-18 NOTE — Assessment & Plan Note (Signed)
This does not seem to be asthma-did not use albuterol except for wheezing, it can make her tachycardia worse. She does have restriction on her spirometry which is difficult to explain

## 2017-08-18 NOTE — Assessment & Plan Note (Signed)
Did not have a good expiration for episodic chest pain with dyspnea. The constellation of rainout syndrome, mild pulmonary hypertension and swallowing issues raised the question of scleroderma.  She does not have the pain skin that would be suggestive but she does have thickening of skin over her knuckles which raises the question of dermatomyositis.  At the same time she does not have significant myalgias  I will review your blood work in the past from [facility] and decide about more collagen vascular work up-she notes a positive RNP in the past and I wonder if she would benefit from a rheumatologic evaluation

## 2017-08-18 NOTE — Patient Instructions (Signed)
Schedule 76-month follow-up CT chest with contrast-Dr. Alvy Bimler can schedule this along with your CT abdomen for adrenal and liver follow-up  This does not seem to be asthma-did not use albuterol except for wheezing, it can make her tachycardia worse.  I will review your blood work in the past from [facility] and decide about more collagen vascular workup

## 2017-08-18 NOTE — Assessment & Plan Note (Signed)
I reassured her that most such pulmonary nodules and of being benign especially in a never smoker her age. She will need six-month follow-up CT chest without contrast around April 2019.  I note that she is due for imaging of her abdomen for liver lesion and adrenal nodule and she will speak with Dr. Ruffin Pyo and arrange for CT chest at the same time that she has her CT abdomen imaging done

## 2017-08-22 ENCOUNTER — Other Ambulatory Visit: Payer: Self-pay | Admitting: Hematology and Oncology

## 2017-08-23 ENCOUNTER — Telehealth: Payer: Self-pay

## 2017-08-23 MED ORDER — LORAZEPAM 1 MG PO TABS: ORAL_TABLET | ORAL | 0 refills | Status: DC

## 2017-08-23 MED ORDER — DRONABINOL 10 MG PO CAPS: 10.0000 mg | ORAL_CAPSULE | Freq: Two times a day (BID) | ORAL | 0 refills | Status: DC

## 2017-08-23 MED ORDER — HYDROCODONE-ACETAMINOPHEN 10-325 MG PO TABS: 1.0000 | ORAL_TABLET | ORAL | 0 refills | Status: DC | PRN

## 2017-08-23 MED FILL — LEVOCETIRIZINE 5 MG TABLET: 5 | 60 days supply | Qty: 60 | Fill #1

## 2017-08-23 MED FILL — EPINEPHRINE 0.3 MG AUTO-INJ: 0.3 | 2 days supply | Qty: 2 | Fill #1

## 2017-08-23 MED FILL — HYDROCODON-APAP 10-325: 10-325 | 15 days supply | Qty: 90 | Fill #0

## 2017-08-23 MED FILL — LORazepam 1 MG TABS: 1 | 30 days supply | Qty: 90 | Fill #0

## 2017-08-23 NOTE — Telephone Encounter (Signed)
Script for hydrocodone placed in triage.  vm left for pt.

## 2017-08-25 MED FILL — DRONABINOL 10 MG CAPS: 10 | 30 days supply | Qty: 60 | Fill #0

## 2017-09-05 ENCOUNTER — Other Ambulatory Visit: Payer: Self-pay | Admitting: Hematology and Oncology

## 2017-09-06 ENCOUNTER — Other Ambulatory Visit: Payer: Self-pay | Admitting: *Deleted

## 2017-09-06 MED ORDER — HYDROCODONE-ACETAMINOPHEN 10-325 MG PO TABS: 1.0000 | ORAL_TABLET | ORAL | 0 refills | Status: DC | PRN

## 2017-09-06 MED ORDER — BUTALBITAL-APAP-CAFFEINE 50-325-40 MG PO TABS: ORAL_TABLET | ORAL | 0 refills | Status: DC

## 2017-09-06 NOTE — Telephone Encounter (Signed)
Received call from pt stating that she left a message on [facility] yest for refill on her fiorecet & hydrocodone & she is going to be nearby with son for pediatric appt & would like to come by & p/u.  Message routed to Dr Robyne Askew

## 2017-09-07 MED FILL — HYDROCODON-APAP 10-325: 10-325 | 15 days supply | Qty: 90 | Fill #0

## 2017-09-07 MED FILL — BUTALB-ACETAMIN-CAFF 50-325: 50-325-40 | 23 days supply | Qty: 90 | Fill #0

## 2017-09-08 ENCOUNTER — Encounter (HOSPITAL_COMMUNITY): Payer: Self-pay | Admitting: Emergency Medicine

## 2017-09-08 ENCOUNTER — Other Ambulatory Visit: Payer: Self-pay

## 2017-09-08 ENCOUNTER — Ambulatory Visit (HOSPITAL_COMMUNITY)
Admission: EM | Admit: 2017-09-08 | Discharge: 2017-09-08 | Disposition: A | Discharge status: Home / Self Care | Payer: Medicare Other | Attending: Family Medicine | Admitting: Family Medicine

## 2017-09-08 DIAGNOSIS — R112 Nausea with vomiting, unspecified: Secondary | ICD-10-CM | POA: Diagnosis not present

## 2017-09-08 DIAGNOSIS — R1012 Left upper quadrant pain: Secondary | ICD-10-CM | POA: Diagnosis not present

## 2017-09-08 LAB — POCT URINALYSIS DIP (DEVICE)
Bilirubin Urine: NEGATIVE
Glucose, UA: NEGATIVE mg/dL
Hgb urine dipstick: NEGATIVE
Leukocytes, UA: NEGATIVE
Nitrite: NEGATIVE
Protein, ur: NEGATIVE mg/dL
Specific Gravity, Urine: 1.02 (ref 1.005–1.030)
Urobilinogen, UA: 0.2 mg/dL (ref 0.0–1.0)
pH: 6.5 (ref 5.0–8.0)

## 2017-09-08 NOTE — ED Triage Notes (Signed)
Pt states "I have an adrenal tumor I found out about it a year ago, they were monitoring it, and some places on my liver. They did another scan which showed it had grown, so ive had some addison's symptoms, I don't normally have pain with it but its gotten painful.".

## 2017-09-08 NOTE — Discharge Instructions (Signed)
Call your doctor to discuss next step

## 2017-09-08 NOTE — ED Provider Notes (Signed)
[place]   [id number] 09/08/17 Arrival Time: 1312   SUBJECTIVE:  Victoria Bates is a 37 y.o. female who presents to the urgent care with complaint of urinary frequency.  She has a h/o adrenal tumor and h/o brain glioma.  No fever or dysuria.  Complains of flank pain x 2-3 weeks.  Tends to vomit regularly.   Past Medical History:  Diagnosis Date  . Anemia   . Anxiety   . Asthma    very mild- no inhaler use since July 2013  . Brain tumor ([place])    Hx: of  . Cancer ([place]) 2003   skin  . Chronic fatigue 10/03/2014  . Cognitive impairment 08/13/2015  . Complication of anesthesia    takes longer for patient to go under anesthesia  . Dizziness   . Dysphasia 02/12/2014  . Glioma, malignant ([place]) dx'd 09/2013  . Headache 02/12/2014  . Headache(784.0)    migraines  . Herniated disc   . Hot flashes 10/03/2014  . Infection    to right head incision  . 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 Liver hemangioma   . Liver lesion 08/22/2014  . Nausea alone 02/12/2014  . Neuropathic pain 04/08/2014  . 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 Seizures ([place])    petit mal serizures most days   Family History  Problem Relation Age of Onset  . Hypertension Mother   . Skin cancer Mother   . Thyroid cancer Father   . Skin cancer Father   . Breast cancer Maternal Aunt        dx in her 58s  . Prostate cancer Maternal Uncle 63  . Melanoma Maternal Uncle   . Ovarian cancer Maternal Grandmother        dx in her late 79s-40s  . Breast cancer Maternal Aunt        bilateral breast cancer dx in 22s and 73s; possibly BRCA+  . Other Son        multiple CAL spots, bright spots on brain MRI, ? chiari malformation   Social History   Socioeconomic History  . Marital status: Single    Spouse name: Not on file  . Number of children: 2  . Years of education: College  . Highest education level: Not on file  Social Needs  . Financial resource strain: Not on file  . Food insecurity - worry: Not on  file  . Food insecurity - inability: Not on file  . Transportation needs - medical: Not on file  . Transportation needs - non-medical: Not on file  Occupational History  . Occupation: Engineer, mining: [employer]  Tobacco Use  . Smoking status: Never Smoker  . Smokeless tobacco: Never Used  Substance and Sexual Activity  . Alcohol use: No    Alcohol/week: 0.0 oz  . Drug use: No  . Sexual activity: Yes    Birth control/protection: None  Other Topics Concern  . Not on file  Social History Narrative   Patient lives at home with her two children.   Caffeine use: minimum of 4 cups daily   No outpatient medications have been marked as taking for the 09/08/17 encounter [facility] Encounter).   Allergies  Allergen Reactions  . Adhesive [Tape] Other (See Comments)    Skin tear  . Latex Hives  . Percocet [Oxycodone-Acetaminophen] Other (See Comments)    makes pt angry  . Shellfish Allergy Diarrhea and Nausea And Vomiting  . Skin Adhesives  Per patient states reaction to dermabond when had hysterectomy. Stated was red and irritated.   Manuela Neptune [Carisoprodol]     Watson brand, specifically, causes disorientation (can take other brands)  . Tramadol Hcl Other (See Comments)    Potentially worsen seizure control  . Vancomycin Hives  . Zofran [Ondansetron Hcl]     Pt. Stated side effects : abdominal pain / constipation       ROS: As per HPI, remainder of ROS negative.   OBJECTIVE:   Vitals:   09/08/17 1328 09/08/17 1329  BP:  (!) 133/93  Pulse: 100   Resp: 18   Temp: 97.6 F (36.4 C)   TempSrc: Oral   SpO2: 100%      General appearance: alert; no distress Eyes: PERRL; EOMI; conjunctiva normal HENT: normocephalic; atraumatic; TMs normal, canal normal, external ears normal without trauma; nasal mucosa normal; oral mucosa normal Neck: supple Lungs: clear to auscultation bilaterally Heart: regular rate and rhythm Abdomen: soft, tender left abdomen and  right lower quadrant; bowel sounds normal; no masses or organomegaly; no guarding or rebound tenderness Back: no CVA tenderness Extremities: no cyanosis or edema; symmetrical with no gross deformities Skin: warm and dry Neurologic: normal gait; grossly normal Psychological: alert and cooperative; normal mood and affect      Labs:  Results for orders placed or performed during the hospital encounter of 09/08/17  POCT urinalysis dip (device)  Result Value Ref Range   Glucose, UA NEGATIVE NEGATIVE mg/dL   Bilirubin Urine NEGATIVE NEGATIVE   Ketones, ur TRACE (A) NEGATIVE mg/dL   Specific Gravity, Urine 1.020 1.005 - 1.030   Hgb urine dipstick NEGATIVE NEGATIVE   pH 6.5 5.0 - 8.0   Protein, ur NEGATIVE NEGATIVE mg/dL   Urobilinogen, UA 0.2 0.0 - 1.0 mg/dL   Nitrite NEGATIVE NEGATIVE   Leukocytes, UA NEGATIVE NEGATIVE    Labs Reviewed  POCT URINALYSIS DIP (DEVICE) - Abnormal; Notable for the following components:      Result Value   Ketones, ur TRACE (*)    All other components within normal limits    No results found.     ASSESSMENT & PLAN:  1. Left upper quadrant pain   2. Intractable vomiting with nausea, unspecified vomiting type     No orders of the defined types were placed in this encounter.   Reviewed expectations re: course of current medical issues. Questions answered. Outlined signs and symptoms indicating need for more acute intervention. Patient verbalized understanding. After Visit Summary given.    Procedures:      Robyn Haber, MD 09/08/17 1426

## 2017-09-20 ENCOUNTER — Other Ambulatory Visit: Payer: Self-pay | Admitting: Hematology and Oncology

## 2017-09-20 MED FILL — DIVALPROEX SOD ER 250 MG TA: 250 | 90 days supply | Qty: 450 | Fill #1

## 2017-09-20 MED FILL — GABAPENTIN 300 MG CAPS: 300 | 30 days supply | Qty: 210 | Fill #1

## 2017-09-20 MED FILL — MONTELUKAST SOD 10 MG TAB: 10 | 90 days supply | Qty: 90 | Fill #1

## 2017-09-21 ENCOUNTER — Inpatient Hospital Stay: Payer: Medicare Other

## 2017-09-21 ENCOUNTER — Encounter: Payer: Self-pay | Admitting: Hematology and Oncology

## 2017-09-21 ENCOUNTER — Telehealth: Payer: Self-pay | Admitting: Hematology and Oncology

## 2017-09-21 ENCOUNTER — Inpatient Hospital Stay (HOSPITAL_BASED_OUTPATIENT_CLINIC_OR_DEPARTMENT_OTHER): Payer: Medicare Other | Admitting: Hematology and Oncology

## 2017-09-21 ENCOUNTER — Inpatient Hospital Stay: Payer: Medicare Other | Attending: Hematology and Oncology

## 2017-09-21 VITALS — BP 175/105 | HR 136 | Temp 97.6°F | Resp 18 | Ht 69.0 in | Wt 136.7 lb

## 2017-09-21 DIAGNOSIS — K769 Liver disease, unspecified: Secondary | ICD-10-CM

## 2017-09-21 DIAGNOSIS — Z79899 Other long term (current) drug therapy: Secondary | ICD-10-CM

## 2017-09-21 DIAGNOSIS — R000 Tachycardia, unspecified: Secondary | ICD-10-CM | POA: Diagnosis not present

## 2017-09-21 DIAGNOSIS — R109 Unspecified abdominal pain: Secondary | ICD-10-CM | POA: Diagnosis not present

## 2017-09-21 DIAGNOSIS — G44021 Chronic cluster headache, intractable: Secondary | ICD-10-CM | POA: Insufficient documentation

## 2017-09-21 DIAGNOSIS — Z9221 Personal history of antineoplastic chemotherapy: Secondary | ICD-10-CM | POA: Insufficient documentation

## 2017-09-21 DIAGNOSIS — R918 Other nonspecific abnormal finding of lung field: Secondary | ICD-10-CM | POA: Insufficient documentation

## 2017-09-21 DIAGNOSIS — R0602 Shortness of breath: Secondary | ICD-10-CM

## 2017-09-21 DIAGNOSIS — C711 Malignant neoplasm of frontal lobe: Secondary | ICD-10-CM | POA: Diagnosis not present

## 2017-09-21 DIAGNOSIS — J4541 Moderate persistent asthma with (acute) exacerbation: Secondary | ICD-10-CM

## 2017-09-21 DIAGNOSIS — I10 Essential (primary) hypertension: Secondary | ICD-10-CM | POA: Insufficient documentation

## 2017-09-21 DIAGNOSIS — G40109 Localization-related (focal) (partial) symptomatic epilepsy and epileptic syndromes with simple partial seizures, not intractable, without status epilepticus: Secondary | ICD-10-CM

## 2017-09-21 DIAGNOSIS — D3502 Benign neoplasm of left adrenal gland: Secondary | ICD-10-CM

## 2017-09-21 DIAGNOSIS — J45909 Unspecified asthma, uncomplicated: Secondary | ICD-10-CM | POA: Insufficient documentation

## 2017-09-21 DIAGNOSIS — G620 Drug-induced polyneuropathy: Secondary | ICD-10-CM | POA: Diagnosis not present

## 2017-09-21 DIAGNOSIS — R0789 Other chest pain: Secondary | ICD-10-CM

## 2017-09-21 DIAGNOSIS — R634 Abnormal weight loss: Secondary | ICD-10-CM

## 2017-09-21 DIAGNOSIS — R42 Dizziness and giddiness: Secondary | ICD-10-CM

## 2017-09-21 LAB — CBC WITH DIFFERENTIAL/PLATELET
Basophils Absolute: 0 10*3/uL (ref 0.0–0.1)
Basophils Relative: 1 %
Eosinophils Absolute: 0 10*3/uL (ref 0.0–0.5)
Eosinophils Relative: 1 %
HCT: 35.4 % (ref 34.8–46.6)
Hemoglobin: 12 g/dL (ref 11.6–15.9)
Lymphocytes Relative: 32 %
Lymphs Abs: 0.8 10*3/uL — ABNORMAL LOW (ref 0.9–3.3)
MCH: 33 pg (ref 25.1–34.0)
MCHC: 34 g/dL (ref 31.5–36.0)
MCV: 97.1 fL (ref 79.5–101.0)
Monocytes Absolute: 0.2 10*3/uL (ref 0.1–0.9)
Monocytes Relative: 7 %
Neutro Abs: 1.6 10*3/uL (ref 1.5–6.5)
Neutrophils Relative %: 59 %
Platelets: 187 10*3/uL (ref 145–400)
RBC: 3.65 MIL/uL — ABNORMAL LOW (ref 3.70–5.45)
RDW: 11.9 % (ref 11.2–14.5)
WBC: 2.6 10*3/uL — ABNORMAL LOW (ref 3.9–10.3)

## 2017-09-21 LAB — COMPREHENSIVE METABOLIC PANEL
ALT: 18 U/L (ref 0–55)
AST: 17 U/L (ref 5–34)
Albumin: 3.7 g/dL (ref 3.5–5.0)
Alkaline Phosphatase: 49 U/L (ref 40–150)
Anion gap: 7 (ref 3–11)
BUN: 11 mg/dL (ref 7–26)
CO2: 28 mmol/L (ref 22–29)
Calcium: 9.5 mg/dL (ref 8.4–10.4)
Chloride: 104 mmol/L (ref 98–109)
Creatinine, Ser: 0.71 mg/dL (ref 0.60–1.10)
GFR calc Af Amer: >60 mL/min (ref >60)
GFR calc non Af Amer: >60 mL/min (ref >60)
Glucose, Bld: 88 mg/dL (ref 70–140)
Potassium: 4 mmol/L (ref 3.5–5.1)
Sodium: 139 mmol/L (ref 136–145)
Total Bilirubin: 0.4 mg/dL (ref 0.2–1.2)
Total Protein: 6.8 g/dL (ref 6.4–8.3)

## 2017-09-21 MED ORDER — HEPARIN SOD (PORK) LOCK FLUSH 100 UNIT/ML IV SOLN
500.0000 [IU] | Freq: Once | INTRAVENOUS | Status: AC | PRN
  Administered 2017-09-21: 500 [IU] via INTRAVENOUS
  Filled 2017-09-21: qty 5

## 2017-09-21 MED ORDER — HYDROCODONE-ACETAMINOPHEN 10-325 MG PO TABS: 1.0000 | ORAL_TABLET | ORAL | 0 refills | Status: DC | PRN

## 2017-09-21 MED ORDER — SODIUM CHLORIDE 0.9 % IJ SOLN
10.0000 mL | INTRAMUSCULAR | Status: DC | PRN
  Administered 2017-09-21: 10 mL via INTRAVENOUS
  Filled 2017-09-21: qty 10

## 2017-09-21 MED FILL — HYDROCODON-APAP 10-325: 10-325 | 15 days supply | Qty: 90 | Fill #0

## 2017-09-21 MED FILL — LORazepam 1 MG TABS: 1 | 30 days supply | Qty: 90 | Fill #0

## 2017-09-21 NOTE — Assessment & Plan Note (Signed)
She has poorly controlled headaches She is taking hydrocodone and Fioricet as needed She is also taking gabapentin for neuropathic pain She will continue her current medications as prescribed 

## 2017-09-21 NOTE — Assessment & Plan Note (Signed)
I have reviewed extensive documentation by neurosurgeon at [facility] The patient will continue follow-up there with repeat imaging study in a few months In the meantime, I will continue to provide active supportive care

## 2017-09-21 NOTE — Assessment & Plan Note (Signed)
She continues to have intermittent abdominal pain She had abnormal liver lesion and adrenal nodule I recommend CT imaging with contrast for evaluation and she agreed to proceed

## 2017-09-21 NOTE — Telephone Encounter (Signed)
Gave patient AVS and calendar of upcoming April appointments.  °

## 2017-09-21 NOTE — Assessment & Plan Note (Signed)
She has significant hypertension alternate with hypotension She is quite tachycardic today but with deep breathing exercises, her symptoms improve

## 2017-09-21 NOTE — Assessment & Plan Note (Signed)
The patient  has poorly controlled seizures I recommend consideration for dietary modification She will continue antiseizure medication through her neurologist

## 2017-09-21 NOTE — Progress Notes (Signed)
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 I have reviewed extensive documentation by neurosurgeon at [facility] The patient will continue follow-up there with repeat imaging study in a few months In the meantime, I will continue to provide active supportive care  Adenoma of left adrenal gland She continues to have intermittent abdominal pain She had abnormal liver lesion and adrenal nodule I recommend CT imaging with contrast for evaluation and she agreed to proceed  Asthma She has poorly controlled asthma and her prior CT revealed lung nodules I have review documentation by her pulmonologist I would proceed with CT imaging for evaluation  Simple partial seizures The patient  has poorly controlled seizures I recommend consideration for dietary modification She will continue antiseizure medication through her neurologist  Intractable chronic cluster headache She has poorly controlled headaches She is taking hydrocodone and Fioricet as needed She is also taking gabapentin for neuropathic pain She will continue her current medications as prescribed  Orthostatic dizziness She has significant hypertension alternate with hypotension She is quite tachycardic today but with deep breathing exercises, her symptoms improve   Orders Placed This Encounter  Procedures  . CT CHEST W CONTRAST    Standing Status:   Future    Standing Expiration  Date:   09/22/2018    Order Specific Question:   If indicated for the ordered procedure, I authorize the administration of contrast media per Radiology protocol    Answer:   Yes    Order Specific Question:   Preferred imaging location?    Answer:   [facility]    Order Specific Question:   Radiology Contrast Protocol - do NOT remove file path    Answer:   \\[email or web address]    Order Specific Question:   Is patient pregnant?    Answer:   No  . CT ABDOMEN PELVIS W CONTRAST    Standing Status:   Future    Standing Expiration Date:   09/22/2018    Order Specific Question:   If indicated for the ordered procedure, I authorize the administration of contrast media per Radiology protocol    Answer:   Yes    Order Specific Question:   Preferred imaging location?    Answer:   [facility]    Order Specific Question:   Radiology Contrast Protocol - do NOT remove file path    Answer:   \\[email or web address]    Order Specific Question:   Is patient pregnant?    Answer:   No    INTERVAL HISTORY: Please see below for problem oriented charting. She returns for further follow-up She continues to battle with intractable headaches, frequent seizures and dizziness She has periodic episodes of hypertension alternate with hypotension She has intermittent severe abdominal pain centered around the epigastrium region without recent changes in bowel habits She was recently evaluated by pulmonologist due to her asthma and shortness of breath and she was recommended to undergo further CT imaging of the chest for evaluation She denies new neurological deficits although she does have stuttering  and occasional dysarthria She have no recent syncopal episode  SUMMARY OF ONCOLOGIC HISTORY:   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      09/27/2013 Pathology Results    Accession: [id number] pathology showed grade 2 oligodendroglioma with mutant IDH1 protein, ATRX, OLIG 2 expression. p53 is negative, low Ki-67 labeling index. 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      11/06/2013 Imaging    Repeat CT scan of the head showed resolution of abscess.      01/23/2014 Surgery    Dr. Christella Noa perform a subtotal gross resection of the tumor.      01/24/2014 Imaging    Repeat imaging studies showed persistent and residual disease.      03/03/2014 - 09/01/2014 Chemotherapy    She was started on Temodar, 5 days every 28 days      03/13/2014 Imaging    Repeat MRI showed no evidence of progression of disease.      07/14/2014 Imaging    Repeat MRI showed no evidence of disease progression.      09/12/2014 Imaging    MRI of the liver show mild regression of the size of lesions, presumed to be benign      09/15/2014 Imaging    Repeat MRI of the brain show persistent abnormalities, suspicious for residual disease      03/02/2015 Imaging     MRI brain at [facility] show persistent abnormalities      08/10/2015 Imaging    Repeat MRI [facility] showed stable disease      08/24/2015 - 02/08/2016 Chemotherapy    She was started on Lomustine, Procarbazine and Vincristine chemotherapy      08/30/2015 Imaging    CT chest showed no evidence of aspiration pneumonia      11/18/2015 Imaging    MRI brain showed status post resection of right frontal lobe tumor without residual or recurrent tumor. 2. Otherwise negative MRI of the brain.       03/02/2016 Imaging    Stable MRI post right frontal lobe tumor resection. Negative for recurrent tumor      06/01/2016 Imaging    Stable MRI post right frontal lobe tumor resection. No recurrent tumor identified      08/19/2016 Imaging    Unchanged examination without tumor recurrence.       11/17/2016 Imaging    Stable right frontal resection cavity. Stable focal mass effect within the right middle frontal gyrus at the superior margin of the cavity which may represent nonenhancing neoplasm. No evidence for tumor progression. Otherwise stable MRI of the brain      12/16/2016 Imaging    MR brain: 1. No acute intracranial abnormality. 2. Unchanged appearance of right frontal lobe resection cavity and surrounding hyperintense T2 weighted signal.      02/04/2017 Imaging    Repeat MRI Normal pituitary  Stable postop changes of right frontal tumor resection      05/12/2017 Imaging    The left ventricular size is normal. There is normal left ventricular wall thickness.  Left ventricular systolic function is normal. LV ejection fraction = 60-65%.  Left ventricular filling pattern is normal. The left ventricular wall motion is normal. The right ventricle is normal in size and function. There is no  significant valvular stenosis or regurgitation No pulmonary hypertension. IVC size was normal. There is no pericardial effusion. There is no comparison study available      05/13/2017 Imaging    1.No evidence of acute pulmonary thromboembolic disease. 2.There is an 8 mm right lower lobe groundglass nodule. Statistically, in this age group, this is most likely to reflect benign etiologies such as a tiny focus of infection or nonspecific inflammation. 4 mm solid right upper lobe lung nodule. A CT scan could be obtained in 12 months to further evaluate if the patient has any known risk factors for malignancy. 3.There is a 1.3 cm left adrenal nodule. This finding is incompletely characterized although and is slightly increased in size relative to the abdominal MRI from February 2016. Although this finding is most likely benign, recommend follow-up adrenal protocol CT in one year.      05/13/2017 Imaging    MRI brain 1.No acute intracranial abnormality. 2.Postsurgical changes  of a right frontal craniotomy for resection of a reported oligodendroglioma. Hyperintense T2/FLAIR signal around the resection cavity and in the subcortical white matter of a gyrus along the superior margin of the cavity is unchanged from 01/05/2015. No evidence of disease progression.       REVIEW OF SYSTEMS:   Constitutional: Denies fevers, chills or abnormal weight loss Eyes: Denies blurriness of vision Ears, nose, mouth, throat, and face: Denies mucositis or sore throat Gastrointestinal:  Denies nausea, heartburn or change in bowel habits Skin: Denies abnormal skin rashes Lymphatics: Denies new lymphadenopathy or easy bruising Behavioral/Psych: Mood is stable, no new changes  All other systems were reviewed with the patient and are negative.  I have reviewed the past medical history, past surgical history, social history and family history with the patient and they are unchanged from previous note.  ALLERGIES:  is allergic to adhesive [tape]; latex; percocet [oxycodone-acetaminophen]; shellfish allergy; skin adhesives; soma [carisoprodol]; tramadol hcl; vancomycin; and zofran [ondansetron hcl].  MEDICATIONS:  Current Outpatient Medications  Medication Sig Dispense Refill  . albuterol (PROAIR HFA) 108 (90 Base) MCG/ACT inhaler Inhale 2 puffs into the lungs every 6 (six) hours as needed for wheezing or shortness of breath.    . EPINEPHrine 0.15 MG/0.15ML IJ injection Inject 0.3 mg into the muscle as needed for anaphylaxis. Prn for breathing difficulty, tachycardia, and angina.    . butalbital-acetaminophen-caffeine (FIORICET, ESGIC) 50-325-40 MG tablet TAKE 1 TABLET BY MOUTH EVERY 6 HOURS AS NEEDED FOR HEADACHE 90 tablet 0  . carisoprodol (SOMA) 350 MG tablet TAKE 1 TABLET BY MOUTH 4 TIMES DAILY FOR SEIZURE PREVENTION 360 tablet 1  . Cholecalciferol (VITAMIN D) 2000 units CAPS Take 2,000 Units by mouth daily.    . Cyanocobalamin (VITAMIN B-12 PO) Take 5,000 Units daily by mouth.     .  divalproex (DEPAKOTE ER) 250 MG 24 hr tablet Take 2 tablets (500 mg total) 2 (two) times daily by mouth. Take 2 tabs in AM, 3 tabs in PM 450 tablet 3  . dronabinol (MARINOL) 10 MG capsule TAKE 1 CAPSULE BY MOUTH TWICE DAILY BEFORE MEALS 60 capsule 0  . gabapentin (NEURONTIN) 300 MG capsule Take 3 caps in AM, 4 caps in PM (Patient taking differently: Take 3 caps in PM, 4 caps in AM) 630 capsule 3  . HYDROcodone-acetaminophen (NORCO) 10-325 MG tablet Take 1 tablet by mouth every 4 (four) hours as needed for moderate pain. 90 tablet 0  . hydrocortisone (CORTEF) 10 MG tablet Take 10 mg by mouth See admin instructions.  1 tablet between 6-8am, 1/2 tablet at 11am, 1/2 tablet between 2-4pm daily, doubling dose on sick days     . levocetirizine (XYZAL) 5 MG tablet Take 1 tablet (5 mg total) by mouth every evening. 60 tablet 5  . LORazepam (ATIVAN) 1 MG tablet TAKE 1 TABLET BY MOUTH EVERY 8 HOURS AS NEEDED FOR ANXIETY OR SEIZURE 90 tablet 0  . LORazepam (ATIVAN) 1 MG tablet TAKE 1 TABLET BY MOUTH EVERY 8 HOURS AS NEEDED FOR ANXIETY OR SEIZURE 90 tablet 0  . montelukast (SINGULAIR) 10 MG tablet Take 1 tablet (10 mg total) by mouth at bedtime. 90 tablet 1  . promethazine (PHENERGAN) 25 MG tablet Take 25 mg every 6 (six) hours as needed by mouth for nausea or vomiting.     No current facility-administered medications for this visit.     PHYSICAL EXAMINATION: ECOG PERFORMANCE STATUS: 2 - Symptomatic, <50% confined to bed  Vitals:   09/21/17 1322  BP: (!) 175/105  Pulse: (!) 136  Resp: 18  Temp: 97.6 F (36.4 C)  SpO2: 100%   Filed Weights   09/21/17 1322  Weight: 136 lb 11.2 oz (62 kg)    GENERAL:alert, no distress and comfortable.  The patient look malnourished and mildly cachectic SKIN: skin color, texture, turgor are normal, no rashes or significant lesions EYES: normal, Conjunctiva are pink and non-injected, sclera clear OROPHARYNX:no exudate, no erythema and lips, buccal mucosa, and tongue  normal  NECK: supple, thyroid normal size, non-tender, without nodularity LYMPH:  no palpable lymphadenopathy in the cervical, axillary or inguinal LUNGS: clear to auscultation and percussion with normal breathing effort HEART: She has profound tachycardia on exam but regular rhythm, no lower extremity edema noted ABDOMEN:abdomen soft, with tenderness on palpation on the epigastric region without rebound and normal bowel sounds Musculoskeletal:no cyanosis of digits and no clubbing  NEURO: alert & oriented x 3 with mild dysarthria, no focal motor/sensory deficits  LABORATORY DATA:  I have reviewed the data as listed    Component Value Date/Time   NA 139 09/21/2017 1252   NA 140 06/15/2017 1207   K 4.0 09/21/2017 1252   K 3.9 06/15/2017 1207   CL 104 09/21/2017 1252   CO2 28 09/21/2017 1252   CO2 24 06/15/2017 1207   GLUCOSE 88 09/21/2017 1252   GLUCOSE 85 06/15/2017 1207   BUN 11 09/21/2017 1252   BUN 5.8 (L) 06/15/2017 1207   CREATININE 0.71 09/21/2017 1252   CREATININE 0.7 06/15/2017 1207   CALCIUM 9.5 09/21/2017 1252   CALCIUM 8.5 06/15/2017 1207   PROT 6.8 09/21/2017 1252   PROT 6.7 06/15/2017 1207   ALBUMIN 3.7 09/21/2017 1252   ALBUMIN 3.9 06/15/2017 1207   AST 17 09/21/2017 1252   AST 14 06/15/2017 1207   ALT 18 09/21/2017 1252   ALT 11 06/15/2017 1207   ALKPHOS 49 09/21/2017 1252   ALKPHOS 42 06/15/2017 1207   BILITOT 0.4 09/21/2017 1252   BILITOT 0.36 06/15/2017 1207   GFRNONAA >60 09/21/2017 1252   GFRAA >60 09/21/2017 1252    No results found for: SPEP, UPEP  Lab Results  Component Value Date   WBC 2.6 (L) 09/21/2017   NEUTROABS 1.6 09/21/2017   HGB 12.0 09/21/2017   HCT 35.4 09/21/2017   MCV 97.1 09/21/2017   PLT 187 09/21/2017      Chemistry      Component Value Date/Time   NA 139 09/21/2017 1252   NA 140 06/15/2017 1207   K 4.0  09/21/2017 1252   K 3.9 06/15/2017 1207   CL 104 09/21/2017 1252   CO2 28 09/21/2017 1252   CO2 24 06/15/2017  1207   BUN 11 09/21/2017 1252   BUN 5.8 (L) 06/15/2017 1207   CREATININE 0.71 09/21/2017 1252   CREATININE 0.7 06/15/2017 1207      Component Value Date/Time   CALCIUM 9.5 09/21/2017 1252   CALCIUM 8.5 06/15/2017 1207   ALKPHOS 49 09/21/2017 1252   ALKPHOS 42 06/15/2017 1207   AST 17 09/21/2017 1252   AST 14 06/15/2017 1207   ALT 18 09/21/2017 1252   ALT 11 06/15/2017 1207   BILITOT 0.4 09/21/2017 1252   BILITOT 0.36 06/15/2017 1207      All questions were answered. The patient knows to call the clinic with any problems, questions or concerns. No barriers to learning was detected.  I spent 30 minutes counseling the patient face to face. The total time spent in the appointment was 40 minutes and more than 50% was on counseling and review of test results  Heath Lark, MD 09/21/2017 4:42 PM

## 2017-09-21 NOTE — Assessment & Plan Note (Signed)
She has poorly controlled asthma and her prior CT revealed lung nodules I have review documentation by her pulmonologist I would proceed with CT imaging for evaluation

## 2017-09-22 MED FILL — DRONABINOL 10 MG CAPS: 10 | 30 days supply | Qty: 60 | Fill #0

## 2017-10-04 ENCOUNTER — Other Ambulatory Visit: Payer: Self-pay | Admitting: *Deleted

## 2017-10-04 MED ORDER — HYDROCODONE-ACETAMINOPHEN 10-325 MG PO TABS: 1.0000 | ORAL_TABLET | ORAL | 0 refills | Status: DC | PRN

## 2017-10-04 MED ORDER — BUTALBITAL-APAP-CAFFEINE 50-325-40 MG PO TABS: ORAL_TABLET | ORAL | 0 refills | Status: DC

## 2017-10-04 MED FILL — HYDROCODON-APAP 10-325: 10-325 | 15 days supply | Qty: 90 | Fill #0

## 2017-10-04 MED FILL — BUTALB-ACETAMIN-CAFF 50-325: 50-325-40 | 22 days supply | Qty: 90 | Fill #0

## 2017-10-05 ENCOUNTER — Telehealth: Payer: Self-pay

## 2017-10-05 NOTE — Telephone Encounter (Signed)
Received notice from [facility] that pt's Gabapentin has been approved 07/18/2017-07/17/2018

## 2017-10-06 ENCOUNTER — Ambulatory Visit (HOSPITAL_COMMUNITY): Admission: RE | Admit: 2017-10-06 | Payer: Medicare Other | Source: Ambulatory Visit

## 2017-10-18 ENCOUNTER — Other Ambulatory Visit: Payer: Self-pay

## 2017-10-18 ENCOUNTER — Telehealth: Payer: Self-pay

## 2017-10-18 DIAGNOSIS — G40109 Localization-related (focal) (partial) symptomatic epilepsy and epileptic syndromes with simple partial seizures, not intractable, without status epilepticus: Secondary | ICD-10-CM

## 2017-10-18 DIAGNOSIS — C719 Malignant neoplasm of brain, unspecified: Secondary | ICD-10-CM

## 2017-10-18 DIAGNOSIS — R110 Nausea: Secondary | ICD-10-CM

## 2017-10-18 MED ORDER — DRONABINOL 10 MG PO CAPS: ORAL_CAPSULE | ORAL | 0 refills | Status: DC

## 2017-10-18 MED ORDER — HYDROCODONE-ACETAMINOPHEN 10-325 MG PO TABS: 1.0000 | ORAL_TABLET | ORAL | 0 refills | Status: DC | PRN

## 2017-10-18 MED FILL — HYDROCODON-APAP 10-325: 10-325 | 15 days supply | Qty: 90 | Fill #0

## 2017-10-18 NOTE — Telephone Encounter (Signed)
Received VM from pt regarding refill for hydrocodone, dronabinol, and carisoprodol.  Called [facility] and they said there are 2 refills available for the carisoprodol and pt can't pick up until April 8th.  Dr Alvy Bimler completed the hydrocodone and dronabinol prescriptions and I called the patient, no answer, left message and let her know the paper scripts would be available for pick-up in the [place].

## 2017-10-20 ENCOUNTER — Ambulatory Visit (HOSPITAL_COMMUNITY)
Admission: RE | Admit: 2017-10-20 | Discharge: 2017-10-20 | Disposition: A | Discharge status: Home / Self Care | Payer: Medicare Other | Source: Ambulatory Visit | Attending: Hematology and Oncology | Admitting: Hematology and Oncology

## 2017-10-20 ENCOUNTER — Telehealth: Payer: Self-pay | Admitting: *Deleted

## 2017-10-20 DIAGNOSIS — R0789 Other chest pain: Secondary | ICD-10-CM | POA: Diagnosis not present

## 2017-10-20 DIAGNOSIS — D3502 Benign neoplasm of left adrenal gland: Secondary | ICD-10-CM | POA: Diagnosis not present

## 2017-10-20 DIAGNOSIS — R0602 Shortness of breath: Secondary | ICD-10-CM | POA: Diagnosis not present

## 2017-10-20 DIAGNOSIS — N83202 Unspecified ovarian cyst, left side: Secondary | ICD-10-CM | POA: Diagnosis not present

## 2017-10-20 DIAGNOSIS — R634 Abnormal weight loss: Secondary | ICD-10-CM | POA: Insufficient documentation

## 2017-10-20 DIAGNOSIS — D3500 Benign neoplasm of unspecified adrenal gland: Secondary | ICD-10-CM | POA: Diagnosis not present

## 2017-10-20 MED ORDER — IOHEXOL 300 MG/ML  SOLN
100.0000 mL | Freq: Once | INTRAMUSCULAR | Status: AC | PRN
  Administered 2017-10-20: 100 mL via INTRAVENOUS

## 2017-10-20 NOTE — Telephone Encounter (Signed)
LM with note below 

## 2017-10-20 NOTE — Telephone Encounter (Signed)
-----   Message from Heath Lark, MD sent at 10/20/2017  4:08 PM EDT ----- Regarding: CT neg Tell her I will review the CT with her when I see her Everything looks OK Signs of constipation seen; advise her to take laxatives if she is constipated ----- Message ----- From: Interface, Rad Results In Sent: 10/20/2017   3:44 PM To: Heath Lark, MD

## 2017-10-31 ENCOUNTER — Other Ambulatory Visit: Payer: Self-pay | Admitting: Adult Health

## 2017-10-31 ENCOUNTER — Other Ambulatory Visit: Payer: Self-pay | Admitting: Hematology and Oncology

## 2017-10-31 ENCOUNTER — Telehealth: Payer: Self-pay

## 2017-10-31 DIAGNOSIS — C719 Malignant neoplasm of brain, unspecified: Secondary | ICD-10-CM

## 2017-10-31 MED ORDER — LORAZEPAM 1 MG PO TABS: 1.0000 mg | ORAL_TABLET | Freq: Three times a day (TID) | ORAL | 0 refills | Status: DC | PRN

## 2017-10-31 MED ORDER — BUTALBITAL-APAP-CAFFEINE 50-325-40 MG PO TABS: ORAL_TABLET | ORAL | 0 refills | Status: DC

## 2017-10-31 MED ORDER — HYDROCODONE-ACETAMINOPHEN 10-325 MG PO TABS: 1.0000 | ORAL_TABLET | ORAL | 0 refills | Status: DC | PRN

## 2017-10-31 NOTE — Telephone Encounter (Signed)
Called and left message that we received mychart message. Rx's are ready for pick up.

## 2017-11-01 MED FILL — BUTALB-ACETAMIN-CAFF 50-325: 50-325-40 | 22 days supply | Qty: 90 | Fill #0

## 2017-11-01 MED FILL — LEVOCETIRIZINE 5 MG TABLET: 5 | 60 days supply | Qty: 60 | Fill #2

## 2017-11-01 MED FILL — HYDROCODON-APAP 10-325: 10-325 | 15 days supply | Qty: 90 | Fill #0

## 2017-11-01 MED FILL — LORazepam 1 MG TABS: 1 | 30 days supply | Qty: 90 | Fill #0

## 2017-11-02 ENCOUNTER — Ambulatory Visit: Payer: Medicare Other | Admitting: Hematology and Oncology

## 2017-11-02 ENCOUNTER — Other Ambulatory Visit: Payer: Medicare Other

## 2017-11-02 ENCOUNTER — Other Ambulatory Visit: Payer: Self-pay | Admitting: Hematology and Oncology

## 2017-11-02 DIAGNOSIS — C711 Malignant neoplasm of frontal lobe: Secondary | ICD-10-CM

## 2017-11-02 DIAGNOSIS — E559 Vitamin D deficiency, unspecified: Secondary | ICD-10-CM

## 2017-11-13 ENCOUNTER — Inpatient Hospital Stay: Payer: Medicare Other | Attending: Hematology and Oncology

## 2017-11-13 ENCOUNTER — Inpatient Hospital Stay: Payer: Medicare Other

## 2017-11-13 ENCOUNTER — Other Ambulatory Visit: Payer: Self-pay | Admitting: Hematology and Oncology

## 2017-11-13 ENCOUNTER — Inpatient Hospital Stay (HOSPITAL_BASED_OUTPATIENT_CLINIC_OR_DEPARTMENT_OTHER): Payer: Medicare Other | Admitting: Hematology and Oncology

## 2017-11-13 ENCOUNTER — Telehealth: Payer: Self-pay | Admitting: Hematology and Oncology

## 2017-11-13 DIAGNOSIS — G40909 Epilepsy, unspecified, not intractable, without status epilepticus: Secondary | ICD-10-CM | POA: Diagnosis not present

## 2017-11-13 DIAGNOSIS — J329 Chronic sinusitis, unspecified: Secondary | ICD-10-CM | POA: Insufficient documentation

## 2017-11-13 DIAGNOSIS — D61810 Antineoplastic chemotherapy induced pancytopenia: Secondary | ICD-10-CM

## 2017-11-13 DIAGNOSIS — G620 Drug-induced polyneuropathy: Secondary | ICD-10-CM | POA: Insufficient documentation

## 2017-11-13 DIAGNOSIS — E559 Vitamin D deficiency, unspecified: Secondary | ICD-10-CM

## 2017-11-13 DIAGNOSIS — G44021 Chronic cluster headache, intractable: Secondary | ICD-10-CM | POA: Diagnosis not present

## 2017-11-13 DIAGNOSIS — J0181 Other acute recurrent sinusitis: Secondary | ICD-10-CM

## 2017-11-13 DIAGNOSIS — Z79899 Other long term (current) drug therapy: Secondary | ICD-10-CM | POA: Diagnosis not present

## 2017-11-13 DIAGNOSIS — R4189 Other symptoms and signs involving cognitive functions and awareness: Secondary | ICD-10-CM

## 2017-11-13 DIAGNOSIS — Z9221 Personal history of antineoplastic chemotherapy: Secondary | ICD-10-CM | POA: Insufficient documentation

## 2017-11-13 DIAGNOSIS — C711 Malignant neoplasm of frontal lobe: Secondary | ICD-10-CM | POA: Insufficient documentation

## 2017-11-13 DIAGNOSIS — T50905D Adverse effect of unspecified drugs, medicaments and biological substances, subsequent encounter: Secondary | ICD-10-CM

## 2017-11-13 DIAGNOSIS — J4520 Mild intermittent asthma, uncomplicated: Secondary | ICD-10-CM

## 2017-11-13 DIAGNOSIS — R238 Other skin changes: Secondary | ICD-10-CM

## 2017-11-13 DIAGNOSIS — R0981 Nasal congestion: Secondary | ICD-10-CM | POA: Diagnosis not present

## 2017-11-13 DIAGNOSIS — R233 Spontaneous ecchymoses: Secondary | ICD-10-CM

## 2017-11-13 DIAGNOSIS — C719 Malignant neoplasm of brain, unspecified: Secondary | ICD-10-CM

## 2017-11-13 DIAGNOSIS — G40109 Localization-related (focal) (partial) symptomatic epilepsy and epileptic syndromes with simple partial seizures, not intractable, without status epilepticus: Secondary | ICD-10-CM

## 2017-11-13 DIAGNOSIS — T451X5A Adverse effect of antineoplastic and immunosuppressive drugs, initial encounter: Secondary | ICD-10-CM

## 2017-11-13 LAB — CBC WITH DIFFERENTIAL/PLATELET
Basophils Absolute: 0 10*3/uL (ref 0.0–0.1)
Basophils Relative: 1 %
Eosinophils Absolute: 0 10*3/uL (ref 0.0–0.5)
Eosinophils Relative: 1 %
HCT: 31.6 % — ABNORMAL LOW (ref 34.8–46.6)
Hemoglobin: 11 g/dL — ABNORMAL LOW (ref 11.6–15.9)
Lymphocytes Relative: 23 %
Lymphs Abs: 0.7 10*3/uL — ABNORMAL LOW (ref 0.9–3.3)
MCH: 33.9 pg (ref 25.1–34.0)
MCHC: 34.8 g/dL (ref 31.5–36.0)
MCV: 97.5 fL (ref 79.5–101.0)
Monocytes Absolute: 0.2 10*3/uL (ref 0.1–0.9)
Monocytes Relative: 6 %
Neutro Abs: 2.1 10*3/uL (ref 1.5–6.5)
Neutrophils Relative %: 69 %
Platelets: 148 10*3/uL (ref 145–400)
RBC: 3.24 MIL/uL — ABNORMAL LOW (ref 3.70–5.45)
RDW: 11.8 % (ref 11.2–14.5)
WBC: 3 10*3/uL — ABNORMAL LOW (ref 3.9–10.3)

## 2017-11-13 MED ORDER — SODIUM CHLORIDE 0.9 % IJ SOLN
10.0000 mL | INTRAMUSCULAR | Status: DC | PRN
  Administered 2017-11-13: 10 mL via INTRAVENOUS
  Filled 2017-11-13: qty 10

## 2017-11-13 MED ORDER — HEPARIN SOD (PORK) LOCK FLUSH 100 UNIT/ML IV SOLN
500.0000 [IU] | Freq: Once | INTRAVENOUS | Status: AC | PRN
  Administered 2017-11-13: 500 [IU] via INTRAVENOUS
  Filled 2017-11-13: qty 5

## 2017-11-13 MED ORDER — HYDROCODONE-ACETAMINOPHEN 10-325 MG PO TABS: 1.0000 | ORAL_TABLET | ORAL | 0 refills | Status: DC | PRN

## 2017-11-13 MED ORDER — AZITHROMYCIN 500 MG PO TABS: 500.0000 mg | ORAL_TABLET | Freq: Every day | ORAL | 0 refills | Status: DC

## 2017-11-13 MED FILL — HYDROCODON-APAP 10-325: 10-325 | 15 days supply | Qty: 90 | Fill #0

## 2017-11-13 MED FILL — DRONABINOL 10 MG CAPS: 10 | 30 days supply | Qty: 60 | Fill #0

## 2017-11-13 MED FILL — AZITHROMYCIN 500 MG TABLET: 500 | 3 days supply | Qty: 3 | Fill #0

## 2017-11-13 NOTE — Telephone Encounter (Signed)
Gave patient AVs and calendar of upcoming June appointments. °

## 2017-11-14 ENCOUNTER — Telehealth: Payer: Self-pay | Admitting: *Deleted

## 2017-11-14 ENCOUNTER — Encounter: Payer: Self-pay | Admitting: Hematology and Oncology

## 2017-11-14 DIAGNOSIS — Z79899 Other long term (current) drug therapy: Secondary | ICD-10-CM | POA: Insufficient documentation

## 2017-11-14 DIAGNOSIS — R238 Other skin changes: Secondary | ICD-10-CM | POA: Insufficient documentation

## 2017-11-14 DIAGNOSIS — R233 Spontaneous ecchymoses: Secondary | ICD-10-CM | POA: Insufficient documentation

## 2017-11-14 DIAGNOSIS — J329 Chronic sinusitis, unspecified: Secondary | ICD-10-CM | POA: Insufficient documentation

## 2017-11-14 LAB — VITAMIN D 25 HYDROXY (VIT D DEFICIENCY, FRACTURES): Vit D, 25-Hydroxy: 29.8 ng/mL — ABNORMAL LOW (ref 30.0–100.0)

## 2017-11-14 NOTE — Assessment & Plan Note (Signed)
The patient continues to have frequent recurrent seizures on a weekly basis She is not able to quantify the number of seizures Her recurrent, frequent seizures are affecting her memory and difficulties carrying activities of daily living Previously, we have discussed the use of scheduled dose lorazepam that appears to help with her nausea, the frequency of seizure and sleep However, the patient attributes Lorazepam as causing difficulties with memory and excessive sedation We have also discussed the use of Marinol or special diet that occasionally might be helpful in seizure disorders At this point in time, I would defer to her neurologist for chronic seizure management

## 2017-11-14 NOTE — Assessment & Plan Note (Signed)
She has poorly controlled headaches She is taking hydrocodone and Fioricet as needed She is also taking gabapentin for neuropathic pain She will continue her current medications as prescribed 

## 2017-11-14 NOTE — Telephone Encounter (Signed)
LM with note below 

## 2017-11-14 NOTE — Assessment & Plan Note (Signed)
I have extensive discussion with Victoria Bates today in regards to imaging study The patient is very tearful She felt that repeat imaging study is unlikely cannot help her current situation She does not want to deal with any further abnormal imaging results She is at peace with the idea she does not want to proceed with surgical resection, chemotherapy or radiation treatment if imaging studies shows evidence of disease progression For that same reason, she is in favor of getting her port removed I support her decision and will continue on active supportive care

## 2017-11-14 NOTE — Assessment & Plan Note (Signed)
She has persistent pancytopenia of unknown etiology, could be related to her medications or prior exposure to chemotherapy She is not symptomatic I recommend observation only

## 2017-11-14 NOTE — Progress Notes (Signed)
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 I have extensive discussion with Victoria Bates today in regards to imaging study The patient is very tearful She felt that repeat imaging study is unlikely cannot help her current situation She does not want to deal with any further abnormal imaging results She is at peace with the idea she does not want to proceed with surgical resection, chemotherapy or radiation treatment if imaging studies shows evidence of disease progression For that same reason, she is in favor of getting her port removed I support her decision and will continue on active supportive care  Simple partial seizures The patient continues to have frequent recurrent seizures on a weekly basis She is not able to quantify the number of seizures Her recurrent, frequent seizures are affecting her memory and difficulties carrying activities of daily living Previously, we have discussed the use of scheduled dose lorazepam that appears to help with her nausea, the frequency of seizure and sleep However, the patient attributes Lorazepam as causing difficulties with memory and excessive sedation We have also discussed the use of Marinol or special diet that occasionally might be helpful in seizure disorders At this point in time, I would defer to her neurologist for chronic seizure management  Pancytopenia due to antineoplastic chemotherapy [facility]) She has persistent pancytopenia  of unknown etiology, could be related to her medications or prior exposure to chemotherapy She is not symptomatic I recommend observation only  Intractable chronic cluster headache She has poorly controlled headaches She is taking hydrocodone and Fioricet as needed She is also taking gabapentin for neuropathic pain She will continue her current medications as prescribed  Cognitive impairment She has cognitive impairment and poor memory, could be due to frequent seizures She missed taking her medications as directed I recommend she set an alarm for reminder to take her medications and I enlist the help of her family to ensure medical compliance  Polypharmacy We had extensive discussions in the past in regards to polypharmacy I have reviewed with her information from local pharmacist in regards to prescription lorazepam and alprazolam I recommend she sticks with one prescription of benzodiazepine, prescribed by me and not to get further prescription refill of alprazolam by her primary care physician I will continue to refill her other prescriptions as needed  Easy bruising The patient has complaints of easy bruising I suspect it could be due to injuries sustained during seizures. She was also exposed to prolonged course of corticosteroid therapy Her platelet count is adequate and she has normal liver function tests I do not believe she has acquired clotting disorder I tried to reassure the patient  Sinus infection The patient has significant sinus congestion She denies recent fever Her sinus congestion and pain has not resolved for prolonged period of time The patient is at risk of recurrent sinusitis I recommend a course of antibiotics with azithromycin   Orders Placed This Encounter  Procedures  . IR Removal Tun Access W/ Port W/O FL    Standing Status:   Future    Standing Expiration Date:  01/14/2019    Order Specific Question:   Reason for exam:    Answer:   no need port     Order Specific Question:   Is the patient pregnant?    Answer:   No    Order Specific Question:   Preferred Imaging Location?    Answer:   [facility]    INTERVAL HISTORY: Please see below for problem oriented charting. 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      09/27/2013 Pathology Results    Accession: [id number] pathology showed grade 2 oligodendroglioma with mutant IDH1 protein, ATRX, OLIG 2 expression. p53 is negative, low Ki-67 labeling index. 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      11/06/2013 Imaging    Repeat CT scan of the head showed resolution of abscess.      01/23/2014 Surgery    Dr. Christella Noa perform a subtotal gross resection of the tumor.      01/24/2014 Imaging    Repeat imaging studies showed persistent and residual disease.      03/03/2014 - 09/01/2014 Chemotherapy    She was started on Temodar, 5 days every 28 days      03/13/2014 Imaging    Repeat MRI showed no evidence of progression of disease.      07/14/2014 Imaging    Repeat MRI showed no evidence of disease progression.      09/12/2014 Imaging    MRI of the liver show mild regression of the size of lesions, presumed to be benign       09/15/2014 Imaging    Repeat MRI of the brain show persistent abnormalities, suspicious for residual disease      03/02/2015 Imaging     MRI brain at [place] show persistent abnormalities      08/10/2015 Imaging    Repeat MRI [facility] showed stable disease      08/24/2015 - 02/08/2016 Chemotherapy    She was started on Lomustine, Procarbazine and Vincristine chemotherapy      08/30/2015 Imaging    CT chest showed no evidence of aspiration pneumonia      11/18/2015 Imaging    MRI brain showed status post resection of right frontal lobe tumor without residual or recurrent tumor. 2. Otherwise negative MRI of the brain.       03/02/2016 Imaging    Stable MRI post right frontal lobe tumor resection. Negative for recurrent tumor      06/01/2016 Imaging    Stable MRI post right frontal lobe tumor resection. No recurrent tumor identified      08/19/2016 Imaging    Unchanged examination without tumor recurrence.      11/17/2016 Imaging    Stable right frontal resection cavity. Stable focal mass effect within the right middle frontal gyrus  at the superior margin of the cavity which may represent nonenhancing neoplasm. No evidence for tumor progression. Otherwise stable MRI of the brain      12/16/2016 Imaging    MR brain: 1. No acute intracranial abnormality. 2. Unchanged appearance of right frontal lobe resection cavity and surrounding hyperintense T2 weighted signal.      02/04/2017 Imaging    Repeat MRI Normal pituitary  Stable postop changes of right frontal tumor resection      05/12/2017 Imaging    The left ventricular size is normal. There is normal left ventricular wall thickness.  Left ventricular systolic function is normal. LV ejection fraction = 60-65%.  Left ventricular filling pattern is normal. The left ventricular wall motion is normal. The right ventricle is normal in size and function. There is no significant valvular stenosis or regurgitation No pulmonary  hypertension. IVC size was normal. There is no pericardial effusion. There is no comparison study available      05/13/2017 Imaging    1.No evidence of acute pulmonary thromboembolic disease. 2.There is an 8 mm right lower lobe groundglass nodule. Statistically, in this age group, this is most likely to reflect benign etiologies such as a tiny focus of infection or nonspecific inflammation. 4 mm solid right upper lobe lung nodule. A CT scan could be obtained in 12 months to further evaluate if the patient has any known risk factors for malignancy. 3.There is a 1.3 cm left adrenal nodule. This finding is incompletely characterized although and is slightly increased in size relative to the abdominal MRI from February 2016. Although this finding is most likely benign, recommend follow-up adrenal protocol CT in one year.      05/13/2017 Imaging    MRI brain 1.No acute intracranial abnormality. 2.Postsurgical changes of a right frontal craniotomy for resection of a reported oligodendroglioma. Hyperintense T2/FLAIR signal around the resection cavity and in the subcortical white matter of a gyrus along the superior margin of the cavity is unchanged from 01/05/2015. No evidence of disease progression.       REVIEW OF SYSTEMS:   Constitutional: Denies fevers, chills or abnormal weight loss Eyes: Denies blurriness of vision Ears, nose, mouth, throat, and face: Denies mucositis or sore throat Respiratory: Denies cough, dyspnea or wheezes Cardiovascular: Denies palpitation, chest discomfort or lower extremity swelling Skin: Denies abnormal skin rashes Lymphatics: Denies new lymphadenopathy or easy bruising Behavioral/Psych: Mood is stable, no new changes  All other systems were reviewed with the patient and are negative.  I have reviewed the past medical history, past surgical history, social history and family history with the patient and they are unchanged from previous  note.  ALLERGIES:  is allergic to adhesive [tape]; latex; percocet [oxycodone-acetaminophen]; shellfish allergy; skin adhesives; soma [carisoprodol]; tramadol hcl; vancomycin; and zofran [ondansetron hcl].  MEDICATIONS:  Current Outpatient Medications  Medication Sig Dispense Refill  . azithromycin (ZITHROMAX) 500 MG tablet Take 1 tablet (500 mg total) by mouth daily. 3 tablet 0  . butalbital-acetaminophen-caffeine (FIORICET, ESGIC) 50-325-40 MG tablet TAKE 1 TABLET BY MOUTH EVERY 6 HOURS AS NEEDED FOR HEADACHE 90 tablet 0  . butalbital-acetaminophen-caffeine (FIORICET, ESGIC) 50-325-40 MG tablet TAKE 1 TABLET BY MOUTH EVERY 6 HOURS AS NEEDED HEADACHE 90 tablet 0  . carisoprodol (SOMA) 350 MG tablet TAKE 1 TABLET BY MOUTH 4 TIMES DAILY FOR SEIZURE PREVENTION 360 tablet 1  . Cholecalciferol (VITAMIN D) 2000 units CAPS Take 2,000 Units by mouth daily.    . Cyanocobalamin (VITAMIN B-12 PO) Take 5,000 Units  daily by mouth.     . divalproex (DEPAKOTE ER) 250 MG 24 hr tablet Take 2 tablets (500 mg total) 2 (two) times daily by mouth. Take 2 tabs in AM, 3 tabs in PM 450 tablet 3  . dronabinol (MARINOL) 10 MG capsule TAKE 1 CAPSULE BY MOUTH TWICE DAILY BEFORE MEALS 60 capsule 0  . EPINEPHrine 0.15 MG/0.15ML IJ injection Inject 0.3 mg into the muscle as needed for anaphylaxis. Prn for breathing difficulty, tachycardia, and angina.    Marland Kitchen gabapentin (NEURONTIN) 300 MG capsule Take 3 caps in AM, 4 caps in PM (Patient taking differently: Take 3 caps in PM, 4 caps in AM) 630 capsule 3  . HYDROcodone-acetaminophen (NORCO) 10-325 MG tablet Take 1 tablet by mouth every 4 (four) hours as needed for moderate pain. 90 tablet 0  . levocetirizine (XYZAL) 5 MG tablet Take 1 tablet (5 mg total) by mouth every evening. 60 tablet 5  . LORazepam (ATIVAN) 1 MG tablet Take 1 tablet (1 mg total) by mouth every 8 (eight) hours as needed for anxiety or seizure (as needed for for anxiety or seizure.). 90 tablet 0  . LORazepam  (ATIVAN) 1 MG tablet TAKE 1 TABLET BY MOUTH EVERY 8 HOURS AS NEEDED ANXIETY OR SEIZURE 90 tablet 0  . montelukast (SINGULAIR) 10 MG tablet TAKE 1 TABLET BY MOUTH ONCE DAILY AT BEDTIME 90 tablet 3  . promethazine (PHENERGAN) 25 MG tablet Take 25 mg every 6 (six) hours as needed by mouth for nausea or vomiting.     No current facility-administered medications for this visit.     PHYSICAL EXAMINATION: ECOG PERFORMANCE STATUS: 2 - Symptomatic, <50% confined to bed  Vitals:   11/13/17 1058  BP: 121/78  Pulse: 74  Resp: 18  Temp: (!) 97.4 F (36.3 C)  SpO2: 100%   Filed Weights   11/13/17 1058  Weight: 138 lb 11.2 oz (62.9 kg)    GENERAL:alert, no distress and comfortable NEURO: alert & oriented x 3 with occasional stuttering of speech  LABORATORY DATA:  I have reviewed the data as listed    Component Value Date/Time   NA 139 09/21/2017 1252   NA 140 06/15/2017 1207   K 4.0 09/21/2017 1252   K 3.9 06/15/2017 1207   CL 104 09/21/2017 1252   CO2 28 09/21/2017 1252   CO2 24 06/15/2017 1207   GLUCOSE 88 09/21/2017 1252   GLUCOSE 85 06/15/2017 1207   BUN 11 09/21/2017 1252   BUN 5.8 (L) 06/15/2017 1207   CREATININE 0.71 09/21/2017 1252   CREATININE 0.7 06/15/2017 1207   CALCIUM 9.5 09/21/2017 1252   CALCIUM 8.5 06/15/2017 1207   PROT 6.8 09/21/2017 1252   PROT 6.7 06/15/2017 1207   ALBUMIN 3.7 09/21/2017 1252   ALBUMIN 3.9 06/15/2017 1207   AST 17 09/21/2017 1252   AST 14 06/15/2017 1207   ALT 18 09/21/2017 1252   ALT 11 06/15/2017 1207   ALKPHOS 49 09/21/2017 1252   ALKPHOS 42 06/15/2017 1207   BILITOT 0.4 09/21/2017 1252   BILITOT 0.36 06/15/2017 1207   GFRNONAA >60 09/21/2017 1252   GFRAA >60 09/21/2017 1252    No results found for: SPEP, UPEP  Lab Results  Component Value Date   WBC 3.0 (L) 11/13/2017   NEUTROABS 2.1 11/13/2017   HGB 11.0 (L) 11/13/2017   HCT 31.6 (L) 11/13/2017   MCV 97.5 11/13/2017   PLT 148 11/13/2017      Chemistry  Component Value Date/Time   NA 139 09/21/2017 1252   NA 140 06/15/2017 1207   K 4.0 09/21/2017 1252   K 3.9 06/15/2017 1207   CL 104 09/21/2017 1252   CO2 28 09/21/2017 1252   CO2 24 06/15/2017 1207   BUN 11 09/21/2017 1252   BUN 5.8 (L) 06/15/2017 1207   CREATININE 0.71 09/21/2017 1252   CREATININE 0.7 06/15/2017 1207      Component Value Date/Time   CALCIUM 9.5 09/21/2017 1252   CALCIUM 8.5 06/15/2017 1207   ALKPHOS 49 09/21/2017 1252   ALKPHOS 42 06/15/2017 1207   AST 17 09/21/2017 1252   AST 14 06/15/2017 1207   ALT 18 09/21/2017 1252   ALT 11 06/15/2017 1207   BILITOT 0.4 09/21/2017 1252   BILITOT 0.36 06/15/2017 1207       RADIOGRAPHIC STUDIES: I have personally reviewed the radiological images as listed and agreed with the findings in the report. Ct Chest W Contrast  Result Date: 10/20/2017 CLINICAL DATA:  Diffuse abdominal pain and abnormal weight loss of 20 lb in last 8 months. Left-sided chest pain and shortness of breath. Recently completed chemotherapy for brain glioma. EXAM: CT CHEST, ABDOMEN, AND PELVIS WITH CONTRAST TECHNIQUE: Multidetector CT imaging of the chest, abdomen and pelvis was performed following the standard protocol during bolus administration of intravenous contrast. CONTRAST:  14m OMNIPAQUE IOHEXOL 300 MG/ML  SOLN COMPARISON:  Chest CT on 05/05/2017 and abdomen and pelvis MRI on 11/29/2016 FINDINGS: CT CHEST FINDINGS Cardiovascular: No acute findings. Mediastinum/Lymph Nodes: No masses or pathologically enlarged lymph nodes identified. Lungs/Pleura: No pulmonary infiltrate or mass identified. No effusion present. Musculoskeletal:  No suspicious bone lesions identified. CT ABDOMEN AND PELVIS FINDINGS Hepatobiliary: No masses identified. Probable tiny sub-cm cyst in posterior right hepatic lobe. Gallbladder is unremarkable. No evidence of biliary ductal dilatation. Pancreas:  No mass or inflammatory changes. Spleen:  Within normal limits in size and  appearance. Adrenals/Urinary tract: Stable 1.4 cm benign left adrenal adenoma. Both kidneys are normal in appearance. Unremarkable unopacified urinary bladder. Stomach/Bowel: No evidence of obstruction, inflammatory process, or abnormal fluid collections. Moderate to large stool burden is increased since previous study. Vascular/Lymphatic: No pathologically enlarged lymph nodes identified. No abdominal aortic aneurysm. Reproductive: Previous hysterectomy. 3.3 cm benign-appearing left ovarian cyst. No evidence of pelvic inflammatory process or abnormal fluid collections. Other:  None. Musculoskeletal:  No suspicious bone lesions identified. IMPRESSION: 3.3 cm benign-appearing left ovarian cyst, most likely physiologic in a reproductive age female. Large stool burden noted; recommend clinical correlation for possible constipation. Stable small benign left adrenal adenoma. Electronically Signed   By: JEarle GellM.D.   On: 10/20/2017 15:41   Ct Abdomen Pelvis W Contrast  Result Date: 10/20/2017 CLINICAL DATA:  Diffuse abdominal pain and abnormal weight loss of 20 lb in last 8 months. Left-sided chest pain and shortness of breath. Recently completed chemotherapy for brain glioma. EXAM: CT CHEST, ABDOMEN, AND PELVIS WITH CONTRAST TECHNIQUE: Multidetector CT imaging of the chest, abdomen and pelvis was performed following the standard protocol during bolus administration of intravenous contrast. CONTRAST:  1070mOMNIPAQUE IOHEXOL 300 MG/ML  SOLN COMPARISON:  Chest CT on 05/05/2017 and abdomen and pelvis MRI on 11/29/2016 FINDINGS: CT CHEST FINDINGS Cardiovascular: No acute findings. Mediastinum/Lymph Nodes: No masses or pathologically enlarged lymph nodes identified. Lungs/Pleura: No pulmonary infiltrate or mass identified. No effusion present. Musculoskeletal:  No suspicious bone lesions identified. CT ABDOMEN AND PELVIS FINDINGS Hepatobiliary: No masses identified. Probable tiny sub-cm cyst in posterior  right hepatic  lobe. Gallbladder is unremarkable. No evidence of biliary ductal dilatation. Pancreas:  No mass or inflammatory changes. Spleen:  Within normal limits in size and appearance. Adrenals/Urinary tract: Stable 1.4 cm benign left adrenal adenoma. Both kidneys are normal in appearance. Unremarkable unopacified urinary bladder. Stomach/Bowel: No evidence of obstruction, inflammatory process, or abnormal fluid collections. Moderate to large stool burden is increased since previous study. Vascular/Lymphatic: No pathologically enlarged lymph nodes identified. No abdominal aortic aneurysm. Reproductive: Previous hysterectomy. 3.3 cm benign-appearing left ovarian cyst. No evidence of pelvic inflammatory process or abnormal fluid collections. Other:  None. Musculoskeletal:  No suspicious bone lesions identified. IMPRESSION: 3.3 cm benign-appearing left ovarian cyst, most likely physiologic in a reproductive age female. Large stool burden noted; recommend clinical correlation for possible constipation. Stable small benign left adrenal adenoma. Electronically Signed   By: Earle Gell M.D.   On: 10/20/2017 15:41    All questions were answered. The patient knows to call the clinic with any problems, questions or concerns. No barriers to learning was detected.  I spent 30 minutes counseling the patient face to face. The total time spent in the appointment was 45 minutes and more than 50% was on counseling and review of test results  Heath Lark, MD 11/14/2017 6:57 AM

## 2017-11-14 NOTE — Assessment & Plan Note (Signed)
The patient has complaints of easy bruising I suspect it could be due to injuries sustained during seizures. She was also exposed to prolonged course of corticosteroid therapy Her platelet count is adequate and she has normal liver function tests I do not believe she has acquired clotting disorder I tried to reassure the patient

## 2017-11-14 NOTE — Assessment & Plan Note (Signed)
The patient has significant sinus congestion She denies recent fever Her sinus congestion and pain has not resolved for prolonged period of time The patient is at risk of recurrent sinusitis I recommend a course of antibiotics with azithromycin

## 2017-11-14 NOTE — Assessment & Plan Note (Signed)
She has cognitive impairment and poor memory, could be due to frequent seizures She missed taking her medications as directed I recommend she set an alarm for reminder to take her medications and I enlist the help of her family to ensure medical compliance

## 2017-11-14 NOTE — Assessment & Plan Note (Signed)
We had extensive discussions in the past in regards to polypharmacy I have reviewed with her information from local pharmacist in regards to prescription lorazepam and alprazolam I recommend she sticks with one prescription of benzodiazepine, prescribed by me and not to get further prescription refill of alprazolam by her primary care physician I will continue to refill her other prescriptions as needed

## 2017-11-14 NOTE — Telephone Encounter (Signed)
-----   Message from Heath Lark, MD sent at 11/14/2017  6:41 AM EDT ----- Regarding: Vit D level It is better, almost back to normal Continue current dose of vitamin D supplement ----- Message ----- From: Interface, Lab In [place] Sent: 11/13/2017  10:56 AM To: Heath Lark, MD

## 2017-11-21 ENCOUNTER — Ambulatory Visit: Payer: Medicare Other | Admitting: Internal Medicine

## 2017-11-21 ENCOUNTER — Telehealth: Payer: Self-pay | Admitting: Pulmonary Disease

## 2017-11-21 DIAGNOSIS — R0602 Shortness of breath: Secondary | ICD-10-CM

## 2017-11-21 NOTE — Telephone Encounter (Signed)
Called and spoke with patient, she states that she was supposed to have some blood work done per RA and she is wanting to know if she needs to come in and have it done before her next visit. I did not see where anything was ordered.   RA please advise, thanks.

## 2017-11-21 NOTE — Telephone Encounter (Signed)
Please obtain -collagen-vascular blood work from [employer], RA factor etc Suggest repeat ENA profile before visit

## 2017-11-22 ENCOUNTER — Ambulatory Visit: Payer: Medicare Other | Admitting: Pulmonary Disease

## 2017-11-22 MED FILL — GABAPENTIN 300 MG CAPSULE: 300 | 30 days supply | Qty: 210 | Fill #2

## 2017-11-23 NOTE — Telephone Encounter (Signed)
Called [facility] and spoke with medical records and was advised she will fax over last blood work (from 01/2017) which includes RF and ANA. Called and spoke to pt. She is aware to come in prior to appt in 12/2017 to have blood work done.   Will forward to [place] to look out for blood work coming from [facility] [place].

## 2017-11-24 NOTE — Telephone Encounter (Signed)
Labs have been placed in RA's folder.

## 2017-11-24 NOTE — Telephone Encounter (Signed)
ANA, RA factor neg

## 2017-11-27 ENCOUNTER — Other Ambulatory Visit: Payer: Self-pay | Admitting: Hematology and Oncology

## 2017-11-27 ENCOUNTER — Encounter: Payer: Self-pay | Admitting: Hematology and Oncology

## 2017-11-27 DIAGNOSIS — R110 Nausea: Secondary | ICD-10-CM

## 2017-11-27 DIAGNOSIS — C719 Malignant neoplasm of brain, unspecified: Secondary | ICD-10-CM

## 2017-11-27 MED ORDER — BUTALBITAL-APAP-CAFFEINE 50-325-40 MG PO TABS: 1.0000 | ORAL_TABLET | Freq: Four times a day (QID) | ORAL | 0 refills | Status: DC | PRN

## 2017-11-27 MED ORDER — HYDROCODONE-ACETAMINOPHEN 10-325 MG PO TABS: 1.0000 | ORAL_TABLET | ORAL | 0 refills | Status: DC | PRN

## 2017-11-27 MED ORDER — LORAZEPAM 1 MG PO TABS: 1.0000 mg | ORAL_TABLET | Freq: Three times a day (TID) | ORAL | 0 refills | Status: DC | PRN

## 2017-11-27 MED ORDER — DRONABINOL 10 MG PO CAPS: ORAL_CAPSULE | ORAL | 0 refills | Status: DC

## 2017-11-27 MED FILL — HYDROCODON-APAP 10-325: 10-325 | 15 days supply | Qty: 90 | Fill #0

## 2017-11-27 MED FILL — BUTALB-ACETAMIN-CAFF 50-325: 50-325-40 | 22 days supply | Qty: 90 | Fill #0

## 2017-11-27 NOTE — Telephone Encounter (Signed)
Spoke with patient, she is aware of results.   However, she expressed that she has been sick within the last few weeks and has been on a zpak and now augmention. She wants to know if those antibiotics will affect her test results.   She would also like for RA to review her lab results from Dr. Alvy Bimler from last week (results in epic).   RA, please advise. Thanks!

## 2017-11-28 ENCOUNTER — Other Ambulatory Visit: Payer: Self-pay | Admitting: Allergy and Immunology

## 2017-11-28 MED FILL — EPINEPHRINE 0.3 MG AUTO-INJ: 0.3 | 30 days supply | Qty: 2 | Fill #0

## 2017-11-29 MED FILL — LORazepam 1 MG TABS: 1 | 30 days supply | Qty: 90 | Fill #0

## 2017-12-01 IMAGING — CT CT ANGIO CHEST
2 of 6 series · 19 of 36 positions shown · IV contrast (omnipaque)
Comparison: Chest radiograph performed earlier today at [DATE] a.m.,
and CTA of the chest performed 10/31/2013

CLINICAL DATA: Acute onset of shortness of breath, cough and
tachycardia. Sensation of recent aspiration. Dysphagia. Current
history of oligodendroglioma and astrocytoma at the right frontal
lobe. Initial encounter.

EXAM:
CT ANGIOGRAPHY CHEST WITH CONTRAST
TECHNIQUE: Multidetector CT imaging of the chest was performed using the
standard protocol during bolus administration of intravenous
contrast. Multiplanar CT image reconstructions and MIPs were
obtained to evaluate the vascular anatomy.
CONTRAST:  100mL OMNIPAQUE IOHEXOL 350 MG/ML SOLN

[Series 6: pe thins · axial · 0.69mm/px · z∈[-50,+221]mm · 18 of 601 slices shown]
[im 29/601  lung]
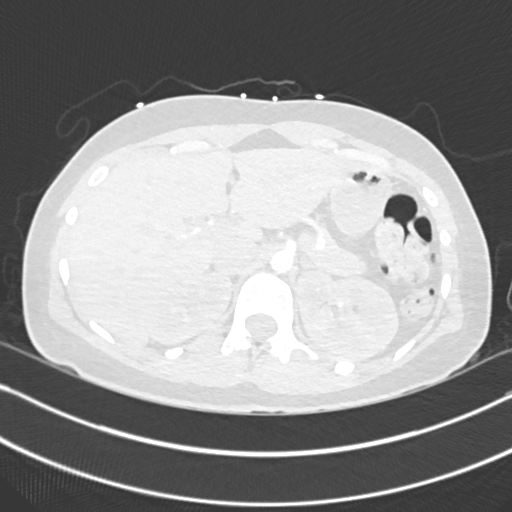
[im 58/601  mediastinal]
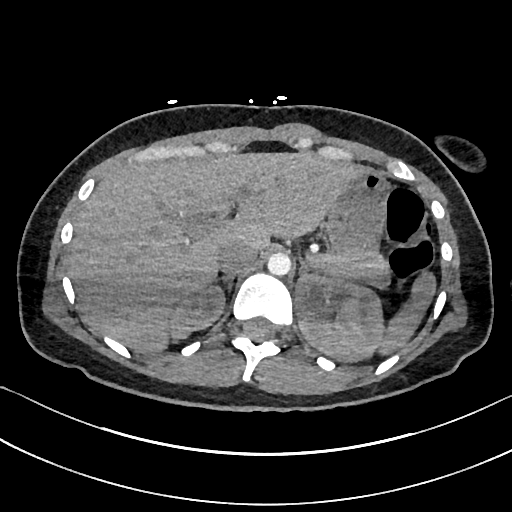
[im 86/601  lung]
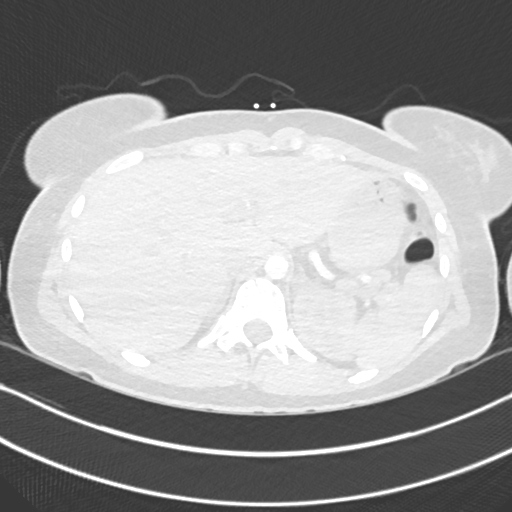
[im 115/601  mediastinal]
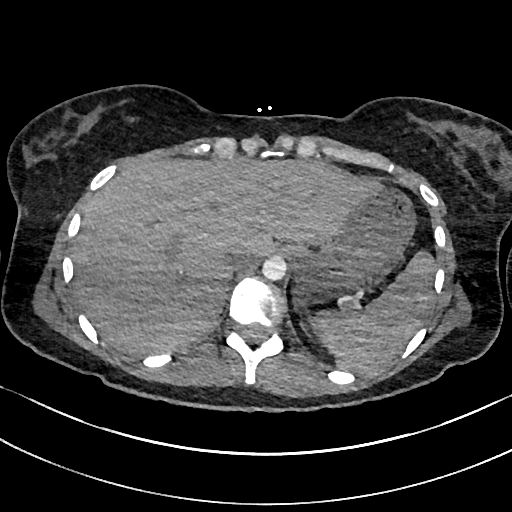
[im 172/601  lung]
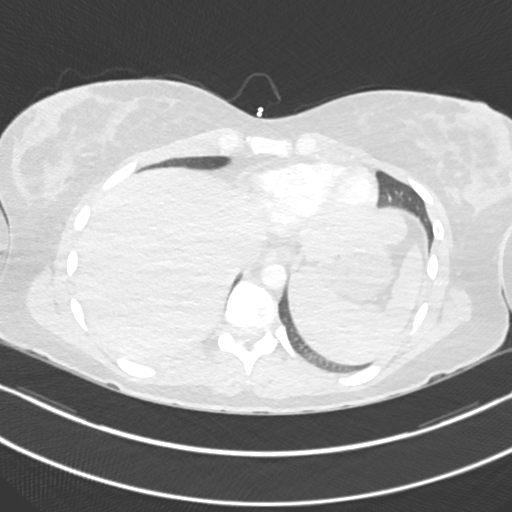
[im 201/601  mediastinal]
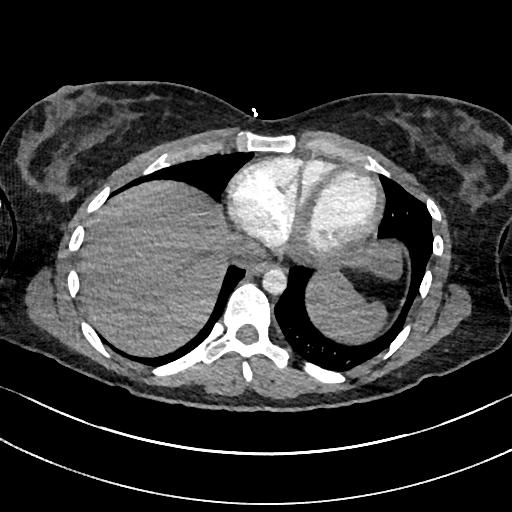
[im 229/601  lung]
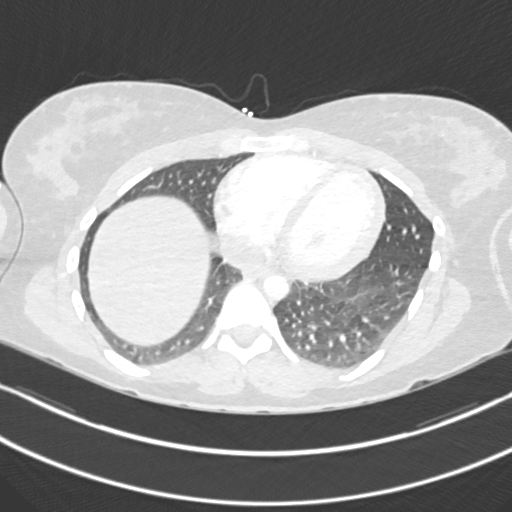
[im 258/601  mediastinal]
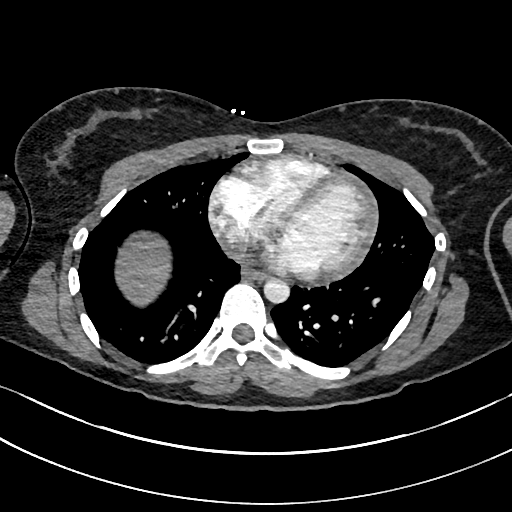
[im 286/601  lung]
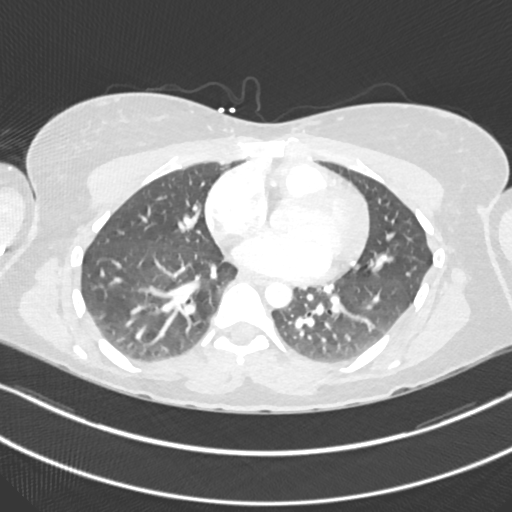
[im 315/601  mediastinal]
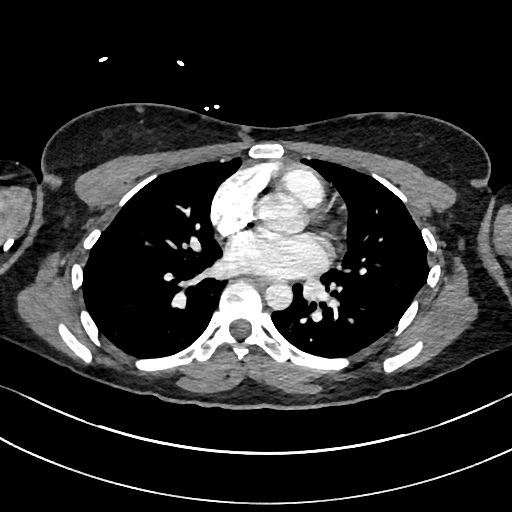
[im 343/601  lung]
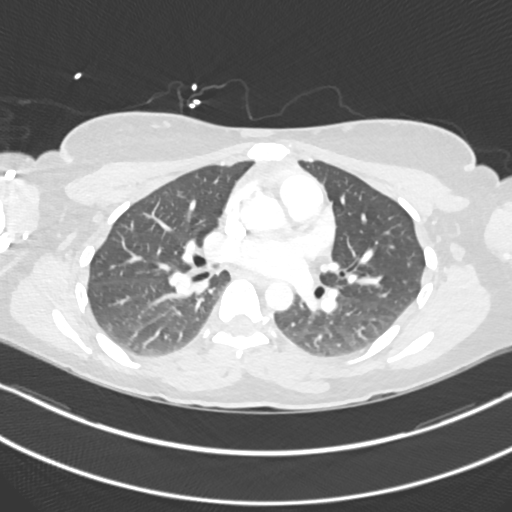
[im 372/601  mediastinal]
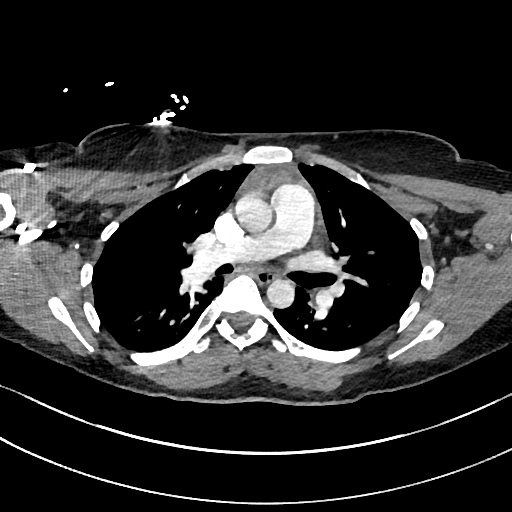
[im 401/601  lung]
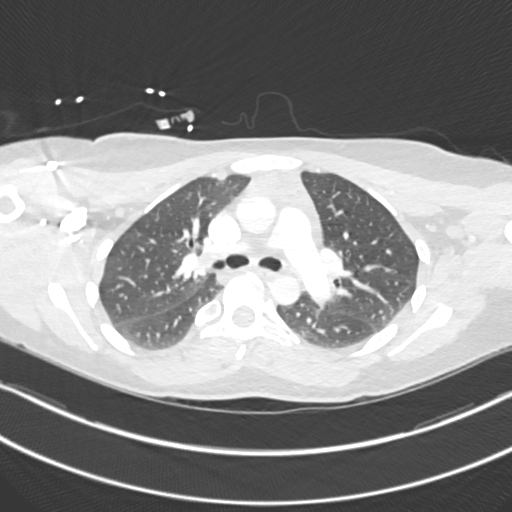
[im 458/601  mediastinal]
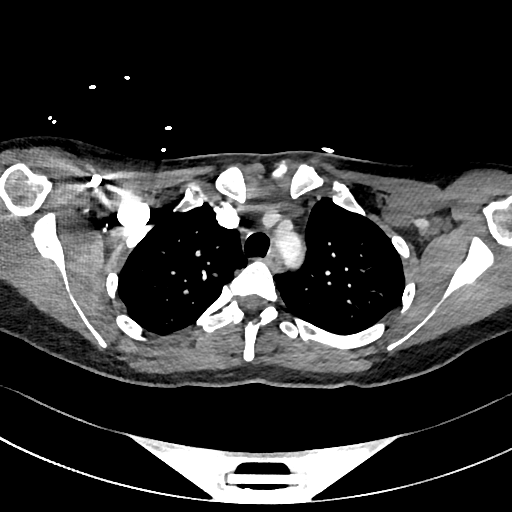
[im 486/601  lung]
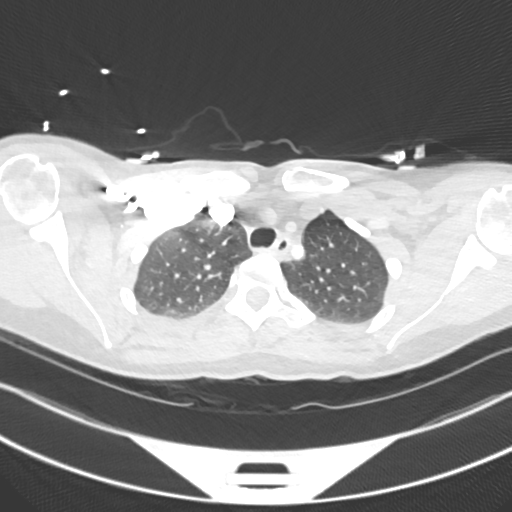
[im 515/601  mediastinal]
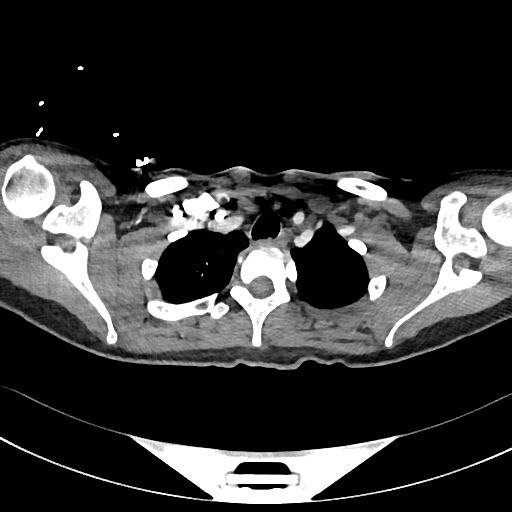
[im 543/601  lung]
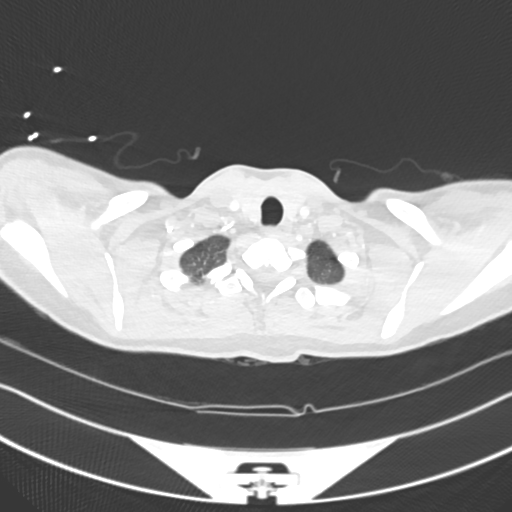
[im 572/601  mediastinal]
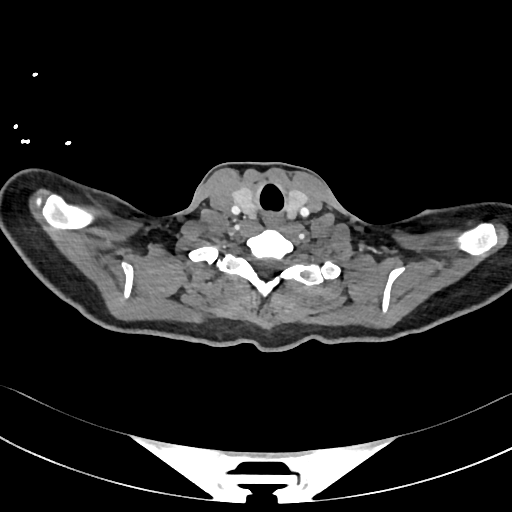

[Series 7: pe 2mm cor · coronal · 0.59mm/px · 1 of 101 slices shown]
[im 51/101  mediastinal]
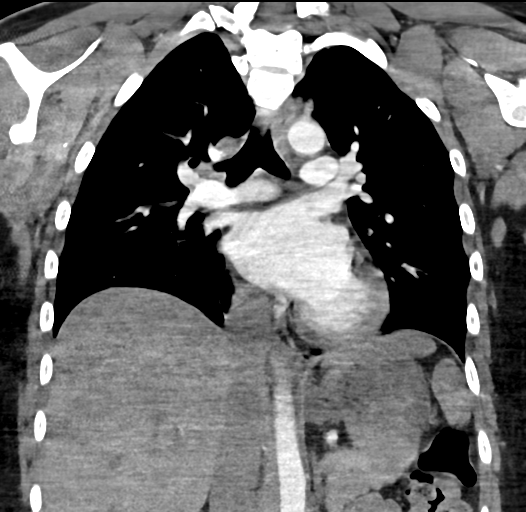

[19 of 36 positions shown; findings below may reference images not displayed]

FINDINGS: There is no evidence of pulmonary embolus.

The lungs are essentially clear bilaterally. There is no evidence of
significant focal consolidation, pleural effusion or pneumothorax.
No masses are identified; no abnormal focal contrast enhancement is
seen.

The mediastinum is unremarkable in appearance. No mediastinal
lymphadenopathy is seen. No pericardial effusion is identified. The
great vessels are grossly unremarkable. No axillary lymphadenopathy
is seen. The visualized portions of the thyroid gland are
unremarkable in appearance.

The visualized portions of the liver and spleen are unremarkable.
The visualized portions of the pancreas, gallbladder, stomach,
adrenal glands and kidneys are within normal limits.

No acute osseous abnormalities are seen.

Review of the MIP images confirms the above findings.
IMPRESSION: No evidence of pulmonary embolus.  Lungs clear bilaterally.

## 2017-12-01 IMAGING — DX DG CHEST 2V
2 series · 2 of 2 positions shown · non-contrast
Comparison: Chest radiograph and CTA of the chest performed
10/31/2013

CLINICAL DATA: Acute onset of cough and shortness of breath.
Initial encounter.

EXAM:
CHEST  2 VIEW

[chest pa]
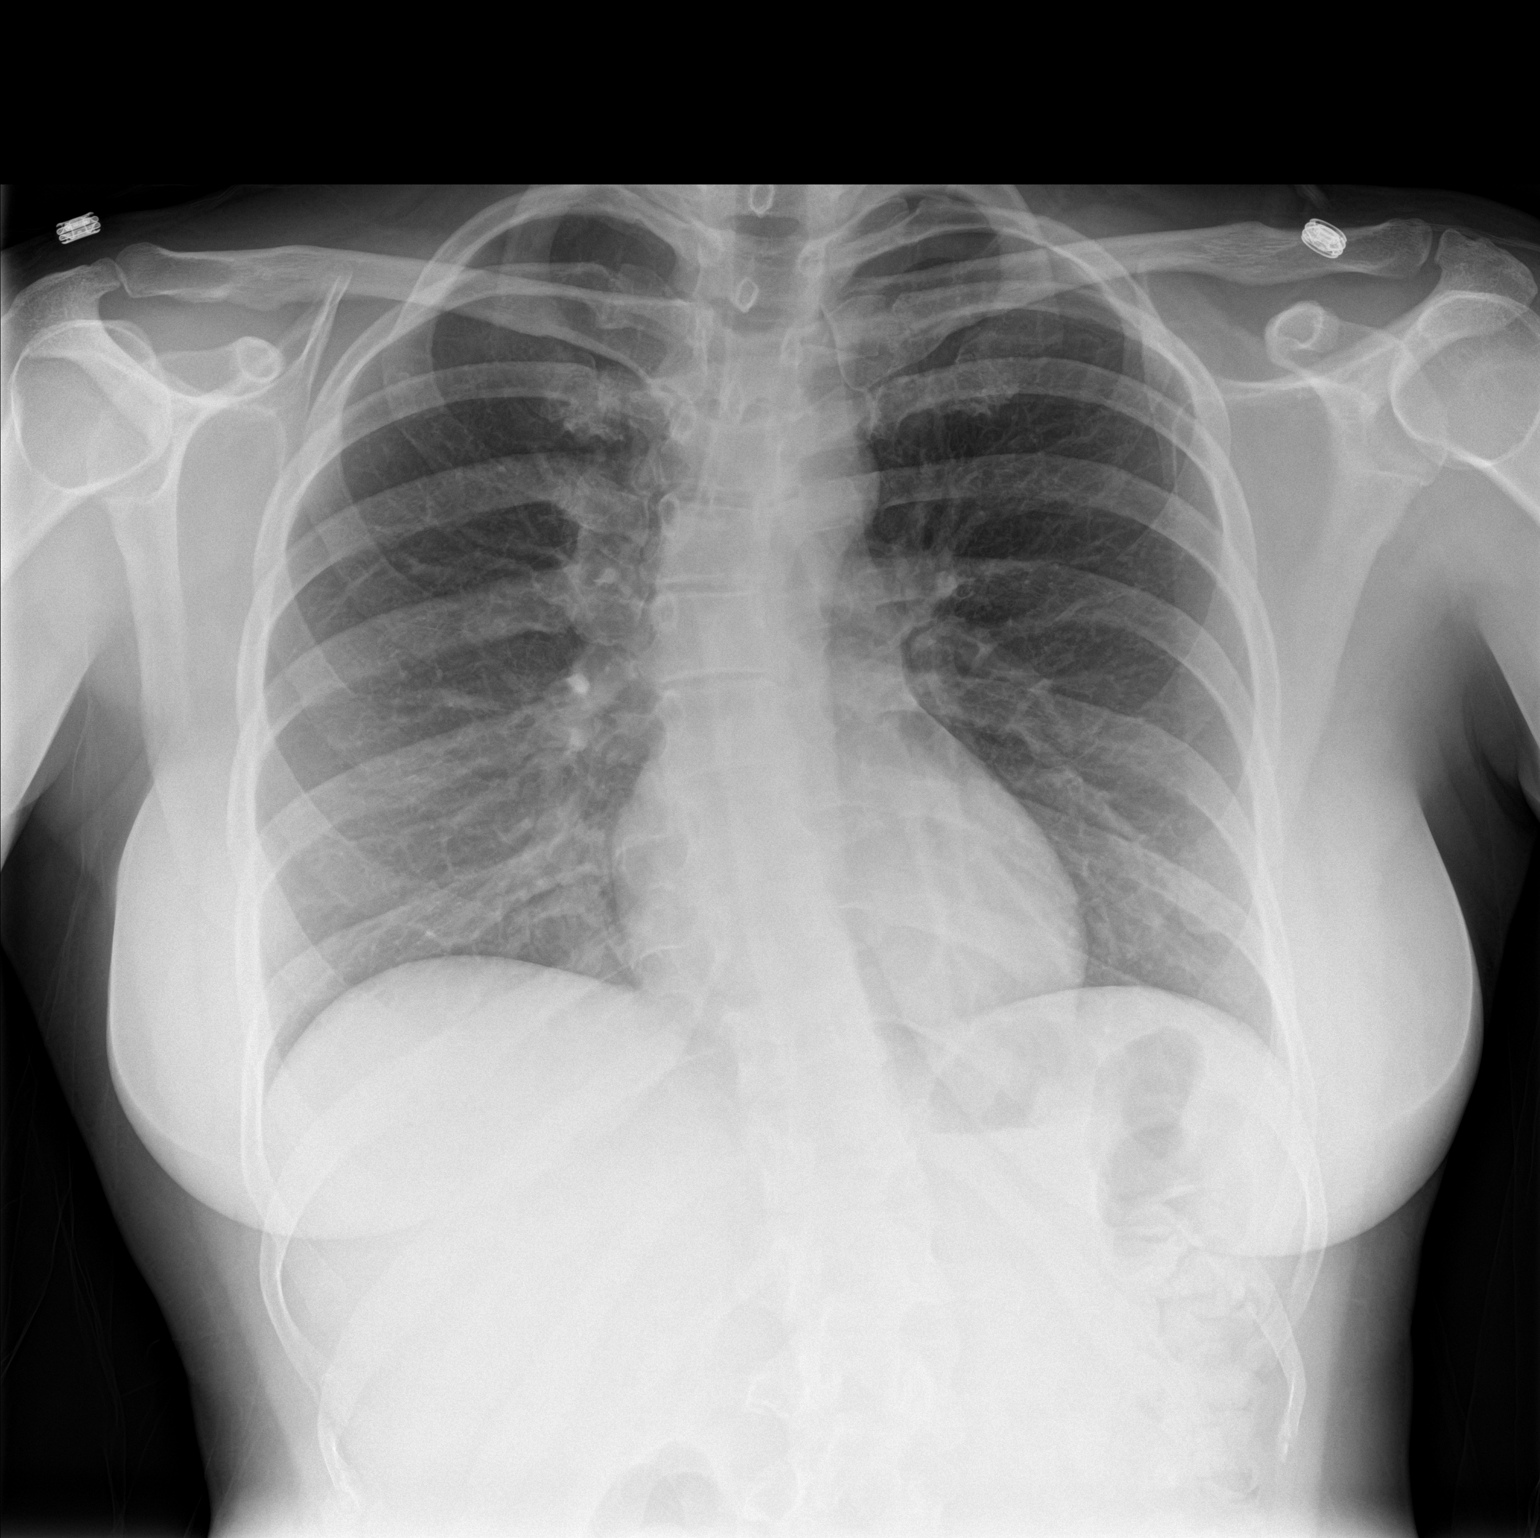

[chest lat]
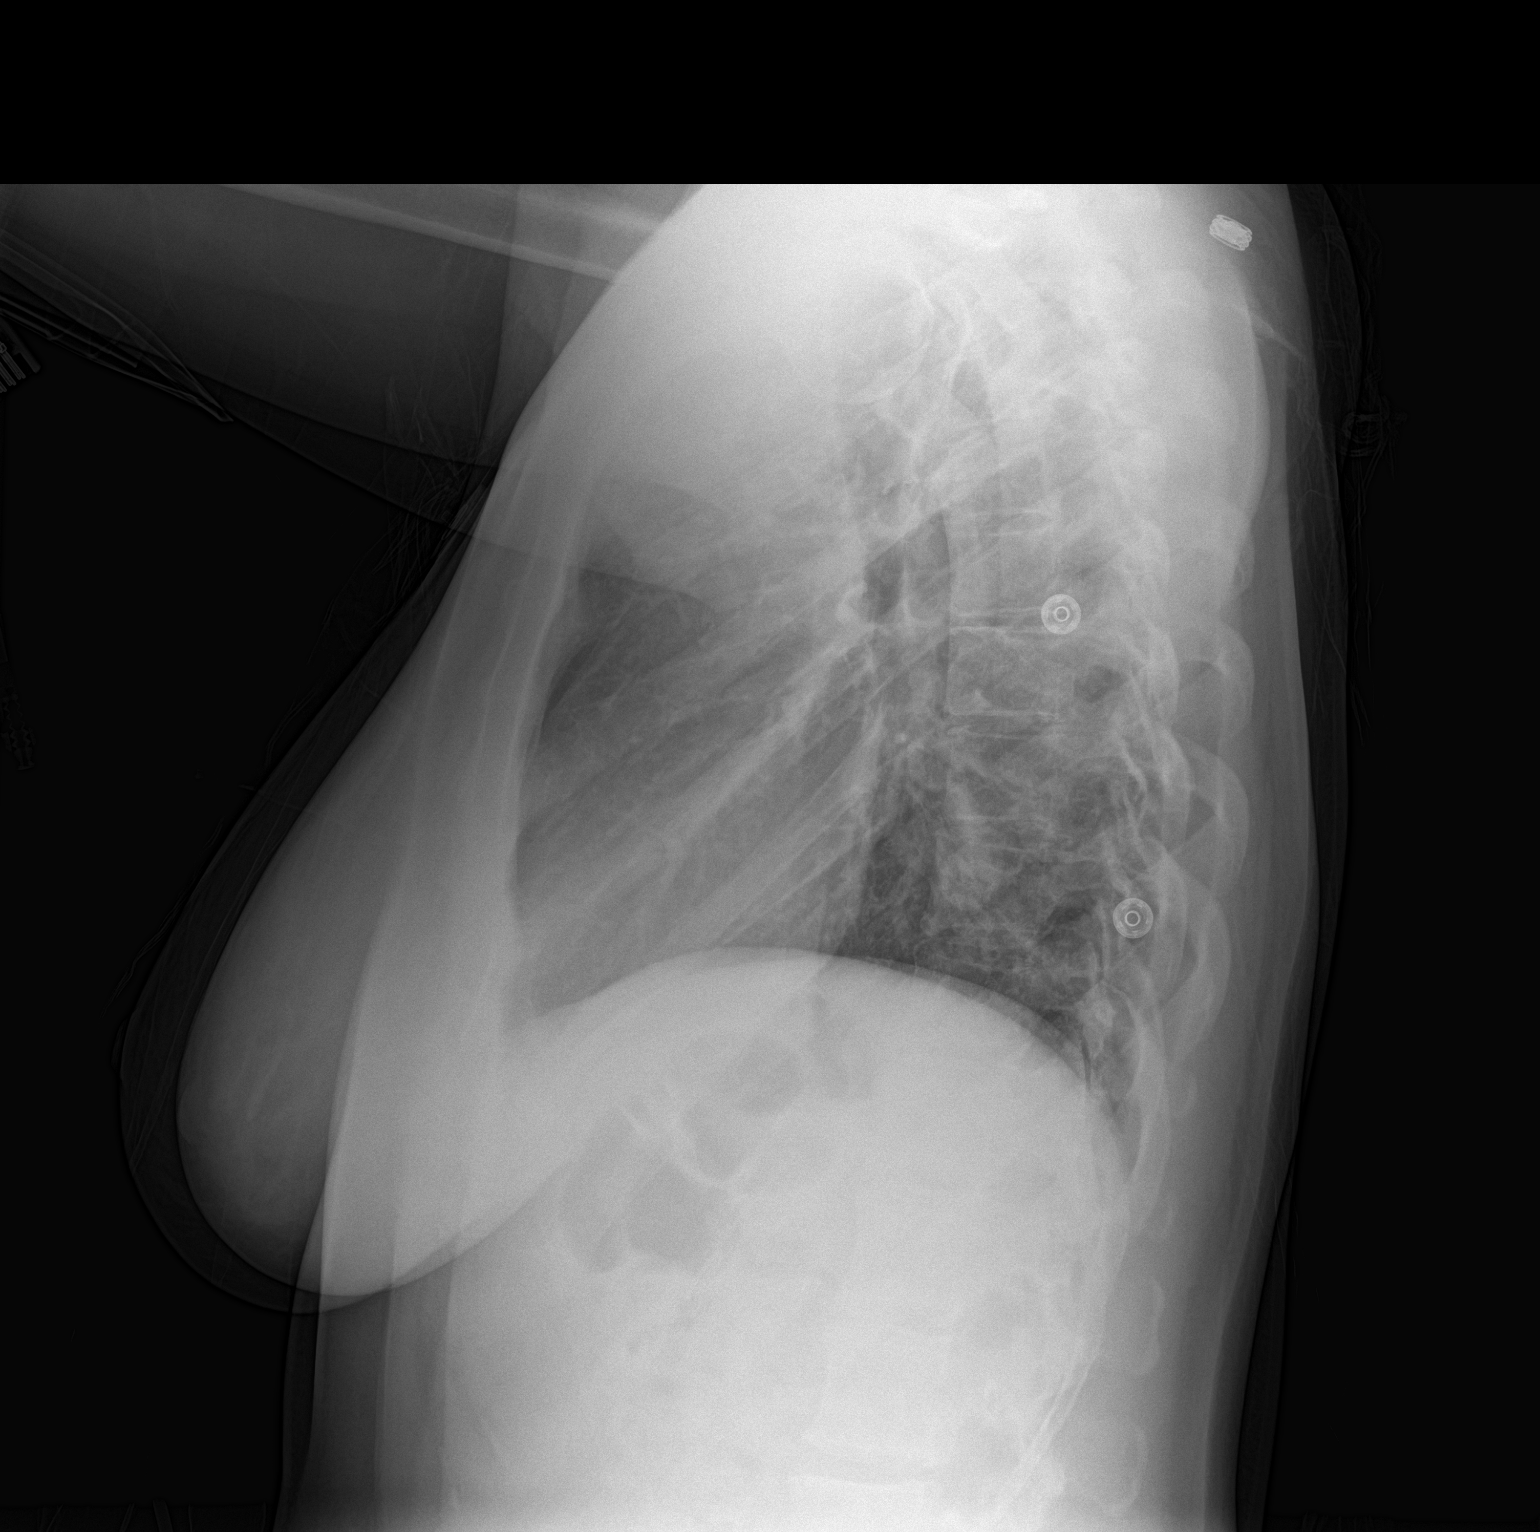

[2 of 2 positions shown; findings below may reference images not displayed]

FINDINGS: The lungs are well-aerated and clear. There is no evidence of focal
opacification, pleural effusion or pneumothorax.

The heart is normal in size; the mediastinal contour is within
normal limits. No acute osseous abnormalities are seen.
IMPRESSION: No acute cardiopulmonary process seen.

## 2017-12-05 ENCOUNTER — Telehealth: Payer: Self-pay

## 2017-12-05 ENCOUNTER — Encounter: Payer: Self-pay | Admitting: Hematology and Oncology

## 2017-12-05 NOTE — Telephone Encounter (Signed)
Called regarding mychart message. Patient  wants to make sure that IR will test the port and inject dye to confirm that the port is not broken, split or malfunctioning before they remove it on Friday. Left the above message on voicemail and ask them to call the nurse back at the office or to call the patient.

## 2017-12-05 NOTE — Telephone Encounter (Signed)
Called [facility]. Per Dr. Alvy Bimler, she should not wait to get MRI at [place] is going to call IR at [facility] and reschedule port removal to after MRI. She is not sure of the date of MRI at [facility]. She will send mychart message with date of MRI.

## 2017-12-05 NOTE — Telephone Encounter (Signed)
Dr. Alva please advise. Thanks. 

## 2017-12-07 ENCOUNTER — Other Ambulatory Visit: Payer: Self-pay | Admitting: Hematology and Oncology

## 2017-12-07 ENCOUNTER — Telehealth: Payer: Self-pay

## 2017-12-07 DIAGNOSIS — C719 Malignant neoplasm of brain, unspecified: Secondary | ICD-10-CM

## 2017-12-07 MED ORDER — HYDROCODONE-ACETAMINOPHEN 10-325 MG PO TABS: 1.0000 | ORAL_TABLET | ORAL | 0 refills | Status: DC | PRN

## 2017-12-07 NOTE — Telephone Encounter (Signed)
Spoke with pt by phone to make her aware that hydrocodone script will be ready for pick up tomorrow morning.  Pt states she will send her husband to pick up script.  Script has been placed in book in Triage.

## 2017-12-08 ENCOUNTER — Ambulatory Visit (HOSPITAL_COMMUNITY)
Admission: RE | Admit: 2017-12-08 | Discharge: 2017-12-08 | Disposition: A | Discharge status: Home / Self Care | Payer: Medicare Other | Source: Ambulatory Visit | Attending: Hematology and Oncology | Admitting: Hematology and Oncology

## 2017-12-08 MED FILL — HYDROCODON-APAP 10-325: 10-325 | 15 days supply | Qty: 90 | Fill #0

## 2017-12-10 ENCOUNTER — Other Ambulatory Visit: Payer: Self-pay | Admitting: Interventional Cardiology

## 2017-12-13 ENCOUNTER — Other Ambulatory Visit: Payer: Self-pay | Admitting: Hematology and Oncology

## 2017-12-13 NOTE — Telephone Encounter (Signed)
Have reviewed labs. Antibiotic should not affect test results

## 2017-12-13 NOTE — Telephone Encounter (Signed)
RA, please advise. Thanks!

## 2017-12-14 ENCOUNTER — Encounter: Payer: Self-pay | Admitting: Neurology

## 2017-12-14 ENCOUNTER — Ambulatory Visit (INDEPENDENT_AMBULATORY_CARE_PROVIDER_SITE_OTHER): Payer: Medicare Other | Admitting: Neurology

## 2017-12-14 ENCOUNTER — Other Ambulatory Visit: Payer: Self-pay

## 2017-12-14 VITALS — BP 120/88 | HR 99 | Ht 69.0 in | Wt 138.0 lb

## 2017-12-14 DIAGNOSIS — G40109 Localization-related (focal) (partial) symptomatic epilepsy and epileptic syndromes with simple partial seizures, not intractable, without status epilepticus: Secondary | ICD-10-CM

## 2017-12-14 DIAGNOSIS — C711 Malignant neoplasm of frontal lobe: Secondary | ICD-10-CM | POA: Diagnosis not present

## 2017-12-14 MED ORDER — DIVALPROEX SODIUM ER 250 MG PO TB24: ORAL_TABLET | ORAL | 3 refills | Status: DC

## 2017-12-14 MED ORDER — GABAPENTIN 300 MG PO CAPS: ORAL_CAPSULE | ORAL | 3 refills | Status: DC

## 2017-12-14 NOTE — Telephone Encounter (Signed)
Attempted to call patient, no answer, left message to call back.  

## 2017-12-14 NOTE — Progress Notes (Signed)
NEUROLOGY FOLLOW UP OFFICE NOTE  Victoria Bates [id number]  DOB: Oct 29, 1980  HISTORY OF PRESENT ILLNESS: I had the pleasure of seeing Victoria Bates in follow-up in the neurology clinic on 12/14/2017. She is alone in the office today. The patient was last seen 6 months ago for seizures. She has a history of a right frontal oligodendroglioma s/p subtotal resection. Most recent MRI in October 2018 was stable. She is seeing her neurosurgeon Dr. Salomon Fick today for follow-up MRI brain. Since her last visit, she reports seizures occurring 2-3 times a week. She feels they are a little more frequent due to the heat and stress with their moving homes. She has a calendar with seizures occurring on 5/18, 5/25, and 5/27. The episode on 5/25 was different, her son was with her, she reports having heart issues and felt dizzy, bent down then hit the floor, passed out. She does not recall much of it, her son could not get her to wake up and called her husband. She does not recall them getting her in the car. She has a knot on her left forehead from it. She has woken up with bruises that she did not know how they happened. She sees Dr. Alvy Bimler regularly, and was put on a scheduled TID dosing of lorazepam to help with the seizures. She felt Xanax was helping better with sleep, but stopped it. She reports that she is not on scheduled dosing of lorazepam, taking one at noon then another at bedtime to help with sleep (although it takes longer to work compared to Xanax). She is also reporting memory issues, she has to write everything down. She knows what she is thinking but it does not come out the way she is thinking it. She is also noticing difficulties with spelling.   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 She was admitted to [facility] last 09/19/2013 due to left vision changes and left facial  heaviness. She reports that she had a 60-lb unintentional weight loss 4 months leading up to her admission, she was vomiting after every meal. She started having episodes where her hands would have low amplitude tremors and she feels the right lower face twitching. She would need to pause and take a minute to get words out. If she did not stop what she was doing, the symptoms could last for hours. She would feel like her body was "going 90 mph" with palpitations up to 200 bpm. She was started on Xanax for anxiety, but this did not help. She then started having vision changes on her left eye, she would park crookedly when she thought she was going straight. She saw an ophthalmologist, eye exam was normal but left optic neuritis was questioned and brain imaging recommended. She then started to have left facial heaviness and numbness on the left face, arm, and leg, and drove herself to [facility] where she was found to have a right frontal lesion. She underwent subtotal resection on 01/23/14 by Dr. Christella Noa and was started on Keppra for seizure prophylaxis at that time. She continued to have the recurrent episodes of tremors in both arms, palpitations, and right facial tightness, as well as episodes of left face, neck, shoulder, and arm numbness, going down the left thumb and index finger. She describes her major seizures as "all of that times 100." After the episodes, she would be tired for  a few days, wanting to sleep, with no energy. She has noticed that her short term memory is "gone." Keppra dose had been increased up to 2 grams daily, and this had caused a change in personality. She had seen neurologist Dr. Leonie Man, who switched her to Depakote. She tapered off Keppra and is currently on Depakote ER 536m BID with significant improvement in symptoms. She denies any side effects to the Depakote.   She has a history of recurrent headaches since her teenage years. Headaches are usually over the right temporal and occipital  region, with sharp stabbing and throbbing pain, with a knot in her left neck region. There is occasional nausea, photo, and phonophobia. She was having these headaches around 1-2 times a week when younger, however since December 2014, headaches have been occurring around 2-3 times a week. She had been receiving injections in her neck for this. If she takes medication at the onset, they last a few hours, however they may last up to several days. She has found that taking Decadron for a couple of days has been the only helpful medication. She takes Fioricet rarely. Zomig, aspirin, and Tylenol were ineffective in the past. Her father and 129year old son have headaches. She denies any diplopia, dysarthria, dysphagia, bowel/bladder dysfunction. She has chronic back pain and had been taking Soma qid for several years, with worsening of pain when she tried reducing this. She was started on Gabapentin for the seizures and back pain, she is unsure if this helps. She had been taking Ultram for the pain, and once stopping this, she noticed an improvement in the seizures as well.   Diagnostic Data: [facility] regions which is likely from breach artifact from prior craniotomy. There is intermittent right frontal sharp activity but no definite epileptiform activity. Impression: Mild right hemispheric focal cortical irritability but no definitive epileptiform activity is noted.  Her 48-hour EEG did not show any epileptiform discharges. It showed breach artifact consistent with prior surgery. She reported numerous symptoms including headache, severe muscle spasm under the base of her skull on the right side, tremors in both hands, jerking and pause in jaw (right side), left leg/inner thigh and left foot warm and pigmented, eyes twitching, fatigue, with no associated epileptiform correlate seen. [facility]  [facility], [facility]: Depakote and Gabapentin were held. Typical events were not clearly captured, however she reported 2 spells where she reported dizziness and visual changes (tiles on floor looked uneven) with no EEG correlate. Baseline EEG showed breach rhythm over the right hemisphere, no epileptiform discharges or electrographic seizures.  I personally reviewed MRI brain with and without contrast done 03/13/14 which showed post-op right-sided craniotomy. The surgical cavity in the right frontal lobe is filled with fluid, with note of improvement in layering blood products since prior study. The study of 01/24/2014 showed restricted diffusion deep to the tumor. This is consistent with acute infarct which is resolving at this time. No acute infarct is present on today's study. No evidence of recurrent or residual tumor although the original tumor did not enhance. No mass effect or edema is present that would suggest recurrent tumor. Small amount of subarachnoid hemorrhage on the right again noted. MRI 11/2015 which I personally reviewed, there is similar pattern of enhancement and abnormal FLAIR signal in the right frontal gyrus immediately superior to the resection, stable from previous scan.  Epilepsy Risk Factors: Right frontal oligodendroglioma s/p subtotal resection. Otherwise she had a normal birth and early development. There is no history of febrile convulsions, CNS infections such as meningitis/encephalitis, or family history of seizures.  PAST MEDICAL HISTORY: Past Medical History:  Diagnosis Date  . Anemia   . Anxiety   . Asthma    very mild- no inhaler use since July 2013  . Brain tumor ([place])    Hx: of  . Cancer ([place]) 2003   skin  . Chronic fatigue 10/03/2014  . Cognitive impairment 08/13/2015  . Complication of anesthesia    takes longer for patient to go under anesthesia  . Dizziness   . Dysphasia 02/12/2014  . Glioma, malignant ([place]) dx'd 09/2013  . Headache 02/12/2014  .  Headache(784.0)    migraines  . Herniated disc   . Hot flashes 10/03/2014  . Infection    to right head incision  . 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 Liver hemangioma   . Liver lesion 08/22/2014  . Nausea alone 02/12/2014  . Neuropathic pain 04/08/2014  . Petit mal seizure status ([place])   . Seizures ([place])    petit mal serizures most days    MEDICATIONS:  Outpatient Encounter Medications as of 12/14/2017  Medication Sig  . amphetamine-dextroamphetamine (ADDERALL XR) 10 MG 24 hr capsule Take 1 capsule by mouth daily as needed (To help stay awake or focus).   Marland Kitchen aspirin 325 MG EC tablet Take 325 mg by mouth daily as needed (TACHYCARDIA).  Marland Kitchen butalbital-acetaminophen-caffeine (FIORICET, ESGIC) 50-325-40 MG tablet Take 1 tablet by mouth every 6 (six) hours as needed for headache.  . carisoprodol (SOMA) 350 MG tablet TAKE 1 TABLET BY MOUTH 4 TIMES DAILY FOR SEIZURE PREVENTION  . Cholecalciferol (VITAMIN D) 2000 units CAPS Take 2,000 Units by mouth daily.  . Cyanocobalamin (VITAMIN B-12 PO) Take 5,000 Units daily by mouth.   . divalproex (DEPAKOTE ER) 250 MG 24 hr tablet Take 2 tablets (500 mg total) 2 (two) times daily by mouth. Take 2 tabs in AM, 3 tabs in PM (Patient taking differently: Take 500-750 mg by mouth See admin instructions. Take 2 tabs(500 mg) in AM, 3 tabs (750 mg) in PM)  . dronabinol (MARINOL) 10 MG capsule TAKE 1 CAPSULE BY MOUTH TWICE DAILY BEFORE MEALS (Patient taking differently: Take 10 mg by mouth 2 (two) times daily as needed (To help weight gain). TAKE 1 CAPSULE BY MOUTH TWICE DAILY BEFORE MEALS)  . EPINEPHRINE 0.3 mg/0.3 mL IJ SOAJ injection INJECT 1 PEN INTO THE MUSCLE ONCE FOR 1 DOSE. (Patient taking differently: INJECT 1 PEN INTO THE MUSCLE ONCE FOR 1 DOSE FOR TACHYCARDIA)  . gabapentin (NEURONTIN) 300 MG capsule Take 3 caps in AM, 4 caps in PM (Patient taking differently: Take 900-1,200 mg by mouth See admin instructions. 4 caps (1200 mg) in AM, 3 caps (900 mg)  in the PM)  .  HYDROcodone-acetaminophen (NORCO) 10-325 MG tablet Take 1 tablet by mouth every 4 (four) hours as needed for moderate pain.  Marland Kitchen levocetirizine (XYZAL) 5 MG tablet Take 1 tablet (5 mg total) by mouth every evening.  Marland Kitchen LORazepam (ATIVAN) 1 MG tablet Take 1 tablet (1 mg total) by mouth every 8 (eight) hours as needed for anxiety.  . montelukast (SINGULAIR) 10 MG tablet TAKE 1 TABLET BY MOUTH ONCE DAILY AT BEDTIME  . promethazine (PHENERGAN) 25 MG tablet Take 25 mg every 6 (six) hours as needed by mouth for nausea or vomiting.   No facility-administered  encounter medications on file as of 12/14/2017.      ALLERGIES: Allergies  Allergen Reactions  . Adhesive [Tape] Other (See Comments)    Skin tear  . Latex Hives  . Percocet [Oxycodone-Acetaminophen] Other (See Comments)    makes pt angry  . Shellfish Allergy Diarrhea and Nausea And Vomiting  . Skin Adhesives     Per patient states reaction to dermabond when had hysterectomy. Stated was red and irritated.   Manuela Neptune [Carisoprodol]     Watson brand, specifically, causes disorientation (can take other brands)  . Tramadol Hcl Other (See Comments)    Potentially worsen seizure control  . Vancomycin Hives  . Zofran [Ondansetron Hcl]     Pt. Stated side effects : abdominal pain / constipation     FAMILY HISTORY: Family History  Problem Relation Age of Onset  . Hypertension Mother   . Skin cancer Mother   . Thyroid cancer Father   . Skin cancer Father   . Breast cancer Maternal Aunt        dx in her 65s  . Prostate cancer Maternal Uncle 63  . Melanoma Maternal Uncle   . Ovarian cancer Maternal Grandmother        dx in her late 75s-40s  . Breast cancer Maternal Aunt        bilateral breast cancer dx in 30s and 95s; possibly BRCA+  . Other Son        multiple CAL spots, bright spots on brain MRI, ? chiari malformation    SOCIAL HISTORY: Social History   Socioeconomic History  . Marital status: Significant Other    Spouse name: Not  on file  . Number of children: 2  . Years of education: College  . Highest education level: Not on file  Occupational History  . Occupation: Engineer, mining: [employer]  Social Needs  . Financial resource strain: Not on file  . Food insecurity:    Worry: Not on file    Inability: Not on file  . Transportation needs:    Medical: Not on file    Non-medical: Not on file  Tobacco Use  . Smoking status: Never Smoker  . Smokeless tobacco: Never Used  Substance and Sexual Activity  . Alcohol use: No    Alcohol/week: 0.0 oz  . Drug use: No  . Sexual activity: Yes    Birth control/protection: None  Lifestyle  . Physical activity:    Days per week: Not on file    Minutes per session: Not on file  . Stress: Not on file  Relationships  . Social connections:    Talks on phone: Not on file    Gets together: Not on file    Attends religious service: Not on file    Active member of club or organization: Not on file    Attends meetings of clubs or organizations: Not on file    Relationship status: Not on file  . Intimate partner violence:    Fear of current or ex partner: Not on file    Emotionally abused: Not on file    Physically abused: Not on file    Forced sexual activity: Not on file  Other Topics Concern  . Not on file  Social History Narrative   Patient lives at home with her two children.   Caffeine use: minimum of 4 cups daily    REVIEW OF SYSTEMS: Constitutional: No fevers, chills, or sweats, + generalized fatigue, change in  appetite Eyes: No visual changes, double vision, eye pain Ear, nose and throat: No hearing loss, ear pain, nasal congestion, sore throat Cardiovascular: No chest pain, palpitations Respiratory:  No shortness of breath at rest or with exertion, wheezes GastrointestinaI: No nausea, vomiting, diarrhea, abdominal pain, fecal incontinence Genitourinary:  No dysuria, urinary retention or frequency Musculoskeletal:  + neck pain, back  pain Integumentary: No rash, pruritus, skin lesions Neurological: as above Psychiatric: No depression, insomnia, +anxiety Endocrine: No palpitations, fatigue, diaphoresis, mood swings, change in appetite, change in weight, increased thirst Hematologic/Lymphatic:  No anemia, purpura, petechiae. Allergic/Immunologic: no itchy/runny eyes, nasal congestion, recent allergic reactions, rashes  PHYSICAL EXAM: Vitals:   12/14/17 0928  BP: 120/88  Pulse: 99  SpO2: 100%   General: No acute distress, anxious Head:  Normocephalic/atraumatic Neck: supple, no paraspinal tenderness, full range of motion Heart:  Regular rate and rhythm Lungs:  Clear to auscultation bilaterally Back: No paraspinal tenderness Skin/Extremities: No rash, no edema Neurological Exam: alert and oriented to person, place, and time. No aphasia or dysarthria. Fund of knowledge is appropriate.  Recent and remote memory are intact.  Attention and concentration are normal.   Cranial nerves: Pupils equal, round. EOMs intact. No facial asymmetry. Tongue, uvula, palate midline.  Motor: 5/5 throughout. No incoordination on finger to nose testing. Gait slow, narrow-based and steady, reporting back pain.  IMPRESSION: This is a 37 yo RH woman with a history of grade 2 oligodengroglioma s/p subtotal resection on chemotherapy, anxiety, headaches, with recurrent episodes of low amplitude tremors, palpitations, and right facial tightening, as well as left face and arm numbness. She had a 48-hour EEG which did not show any epileptiform discharges. Her [facility] showed breach artifact, otherwise no epileptiform discharges. She did not have typical events and hence these could not be commented on. She continues to report frequent seizures. She had did not notice a difference in seizures with increase in gabapentin, and agrees to increase Depakote 223m to 3 tabs BID. If she feels drowsy on the daytime dose, she  will try taking 2 in AM, 4 in PM. Continue gabapentin 3056m Take 4 caps in AM, 3 caps in PM. We discussed seeing how she does over the next 2 months. Dr. GoAlvy Bimlerried doing regular TID dosing of lorazepam to hopefully help with seizures, we discussed that we can try clobazam instead, which is a long-acting benzodiazepine, and only take lorazepam as needed (10 tablets a month). She will call our office in 2 months for an update on Depakote response. 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 Repeat testing may be helpful, she will discuss this with Dr. TaSalomon FickWe discussed speech therapy may be helpful for her cognitive complaints. She is aware of [place] driving laws to stop driving after an episode of loss of consciousness/awareness until 6 months event-free. She will follow-up in 6 months and knows to call for any changes.   Thank you for allowing me to participate in her care.  Please do not hesitate to call for any questions or concerns.  The duration of this appointment visit was 30 minutes of face-to-face time with the patient.  Greater than 50% of this time was spent in counseling, explanation of diagnosis, planning of further management, and coordination of care.   KaEllouise NewerM.D.   CC:  Dr. Buddy Duty, Dr. Alvy Bimler, Dr. Salomon Fick

## 2017-12-14 NOTE — Patient Instructions (Addendum)
1. Increase Depakote 250mg : Take 3 tabs twice a day. If issues with the morning dose, take 2 tabs in AM, 4 tabs in PM instead 2. Continue Gabapentin 300mg : Take 4 caps in AM, 3 caps in PM 3. Call our office in 2 months, we can try the clobazam at that point 4. Consider repeat Neuropsychological testing for the cognitive changes, speech therapy can help some 5. Follow-up in 6 months, call for any changes  Seizure Precautions: 1. If medication has been prescribed for you to prevent seizures, take it exactly as directed.  Do not stop taking the medicine without talking to your doctor first, even if you have not had a seizure in a long time.   2. Avoid activities in which a seizure would cause danger to yourself or to others.  Don't operate dangerous machinery, swim alone, or climb in high or dangerous places, such as on ladders, roofs, or girders.  Do not drive unless your doctor says you may.  3. If you have any warning that you may have a seizure, lay down in a safe place where you can't hurt yourself.    4.  No driving for 6 months from last seizure, as per [facility].   Please refer to the following link on the [facility] website for more information: http://[email or web address].org/answerplace/Social/driving/drivingu.cfm   5.  Maintain good sleep hygiene. Avoid alcohol.  6.  Notify your neurology if you are planning pregnancy or if you become pregnant.  7.  Contact your doctor if you have any problems that may be related to the medicine you are taking.  8.  Call 911 and bring the patient back to the ED if:        A.  The seizure lasts longer than 5 minutes.       B.  The patient doesn't awaken shortly after the seizure  C.  The patient has new problems such as difficulty seeing, speaking or moving  D.  The patient was injured during the seizure  E.  The patient has a temperature over 102 F (39C)  F.  The patient vomited and now is having trouble  breathing

## 2017-12-15 NOTE — Telephone Encounter (Signed)
Spoke with Patient.  She stated that she had completed all previous antibiotics.  Per RA antibiotics should not effect upcoming labs. Patient stated understanding.  Nothing further needed.

## 2017-12-18 MED ORDER — BUTALBITAL-APAP-CAFFEINE 50-325-40 MG PO TABS: 1.0000 | ORAL_TABLET | Freq: Four times a day (QID) | ORAL | 0 refills | Status: DC | PRN

## 2017-12-18 MED FILL — BUTALB-ACETAMIN-CAFF 50-325: 50-325-40 | 22 days supply | Qty: 90 | Fill #0

## 2017-12-21 ENCOUNTER — Other Ambulatory Visit: Payer: Self-pay | Admitting: Allergy and Immunology

## 2017-12-21 ENCOUNTER — Other Ambulatory Visit: Payer: Self-pay | Admitting: Hematology and Oncology

## 2017-12-21 DIAGNOSIS — C719 Malignant neoplasm of brain, unspecified: Secondary | ICD-10-CM

## 2017-12-22 ENCOUNTER — Other Ambulatory Visit: Payer: Medicare Other

## 2017-12-22 ENCOUNTER — Other Ambulatory Visit: Payer: Self-pay | Admitting: Hematology and Oncology

## 2017-12-22 ENCOUNTER — Other Ambulatory Visit: Payer: Self-pay

## 2017-12-22 ENCOUNTER — Telehealth: Payer: Self-pay

## 2017-12-22 DIAGNOSIS — R0602 Shortness of breath: Secondary | ICD-10-CM | POA: Diagnosis not present

## 2017-12-22 MED ORDER — BUTALBITAL-APAP-CAFFEINE 50-325-40 MG PO TABS: 1.0000 | ORAL_TABLET | Freq: Four times a day (QID) | ORAL | 0 refills | Status: DC | PRN

## 2017-12-22 MED ORDER — EPINEPHRINE 0.3 MG/0.3ML IJ SOAJ: 0.3000 mg | Freq: Once | INTRAMUSCULAR | 0 refills | Status: DC

## 2017-12-22 MED ORDER — HYDROCODONE-ACETAMINOPHEN 10-325 MG PO TABS: 1.0000 | ORAL_TABLET | ORAL | 0 refills | Status: DC | PRN

## 2017-12-22 MED FILL — GABAPENTIN 300 MG CAPSULE: 300 | 90 days supply | Qty: 630 | Fill #0

## 2017-12-22 MED FILL — LIDOCAINE-PRILOCAINE CREAM: 2.5-2.5 | 30 days supply | Qty: 30 | Fill #0

## 2017-12-22 MED FILL — EPINEPHRINE 0.3 MG AUTO-INJ: 0.3 | 30 days supply | Qty: 2 | Fill #0

## 2017-12-22 MED FILL — DIVALPROEX SOD ER 250 MG TA: 250 | 90 days supply | Qty: 540 | Fill #0 | Status: TO

## 2017-12-22 MED FILL — HYDROCODON-APAP 10-325: 10-325 | 15 days supply | Qty: 90 | Fill #0

## 2017-12-22 MED FILL — MONTELUKAST SOD 10 MG TAB: 10 | 90 days supply | Qty: 90 | Fill #0

## 2017-12-22 MED FILL — LEVOCETIRIZINE 5 MG TABLET: 5 | 60 days supply | Qty: 60 | Fill #3

## 2017-12-22 NOTE — Addendum Note (Signed)
Addended by: Trenda Moots on: 09/22/8586 03:35 PM   Modules accepted: Orders

## 2017-12-22 NOTE — Telephone Encounter (Signed)
She called and left a message to call her, she needs a refill on her Hydrocodone and Fioricet.   Called back and told Rx's ready for pick up. Emla cream was e-scribed to pharmacy. She verbalized understanding.

## 2017-12-25 ENCOUNTER — Telehealth: Payer: Self-pay | Admitting: Pulmonary Disease

## 2017-12-25 NOTE — Telephone Encounter (Signed)
Since it is after 5, will call [facility] back in the morning.

## 2017-12-26 ENCOUNTER — Ambulatory Visit: Payer: Medicare Other | Admitting: Pulmonary Disease

## 2017-12-26 NOTE — Telephone Encounter (Signed)
I called Victoria Bates at ext [phone], there was no answer. I left a detailed message for her to call back.

## 2017-12-27 ENCOUNTER — Other Ambulatory Visit: Payer: Self-pay | Admitting: Radiology

## 2017-12-27 NOTE — Telephone Encounter (Signed)
Left message to call back 12/27/17.

## 2017-12-28 ENCOUNTER — Ambulatory Visit (HOSPITAL_COMMUNITY)
Admission: RE | Admit: 2017-12-28 | Discharge: 2017-12-28 | Disposition: A | Discharge status: Home / Self Care | Payer: Medicare Other | Source: Ambulatory Visit | Attending: Hematology and Oncology | Admitting: Hematology and Oncology

## 2017-12-28 ENCOUNTER — Encounter (HOSPITAL_COMMUNITY): Payer: Self-pay

## 2017-12-28 ENCOUNTER — Other Ambulatory Visit: Payer: Self-pay | Admitting: Hematology and Oncology

## 2017-12-28 DIAGNOSIS — Z8669 Personal history of other diseases of the nervous system and sense organs: Secondary | ICD-10-CM | POA: Diagnosis not present

## 2017-12-28 DIAGNOSIS — G43909 Migraine, unspecified, not intractable, without status migrainosus: Secondary | ICD-10-CM | POA: Diagnosis not present

## 2017-12-28 DIAGNOSIS — Z885 Allergy status to narcotic agent status: Secondary | ICD-10-CM | POA: Diagnosis not present

## 2017-12-28 DIAGNOSIS — R5382 Chronic fatigue, unspecified: Secondary | ICD-10-CM | POA: Diagnosis not present

## 2017-12-28 DIAGNOSIS — Z7982 Long term (current) use of aspirin: Secondary | ICD-10-CM | POA: Diagnosis not present

## 2017-12-28 DIAGNOSIS — J45909 Unspecified asthma, uncomplicated: Secondary | ICD-10-CM | POA: Diagnosis not present

## 2017-12-28 DIAGNOSIS — G40409 Other generalized epilepsy and epileptic syndromes, not intractable, without status epilepticus: Secondary | ICD-10-CM | POA: Diagnosis not present

## 2017-12-28 DIAGNOSIS — Z85841 Personal history of malignant neoplasm of brain: Secondary | ICD-10-CM | POA: Insufficient documentation

## 2017-12-28 DIAGNOSIS — C719 Malignant neoplasm of brain, unspecified: Secondary | ICD-10-CM

## 2017-12-28 DIAGNOSIS — F419 Anxiety disorder, unspecified: Secondary | ICD-10-CM | POA: Insufficient documentation

## 2017-12-28 DIAGNOSIS — Z452 Encounter for adjustment and management of vascular access device: Secondary | ICD-10-CM | POA: Insufficient documentation

## 2017-12-28 HISTORY — PX: IR REMOVAL TUN ACCESS W/ PORT W/O FL MOD SED: IMG2290

## 2017-12-28 HISTORY — PX: IR RADIOLOGY PERIPHERAL GUIDED IV START: IMG5598

## 2017-12-28 HISTORY — PX: IR US GUIDE VASC ACCESS LEFT: IMG2389

## 2017-12-28 LAB — CBC
HCT: 35.3 % — ABNORMAL LOW (ref 36.0–46.0)
Hemoglobin: 11.7 g/dL — ABNORMAL LOW (ref 12.0–15.0)
MCH: 33.1 pg (ref 26.0–34.0)
MCHC: 33.1 g/dL (ref 30.0–36.0)
MCV: 99.7 fL (ref 78.0–100.0)
Platelets: 155 10*3/uL (ref 150–400)
RBC: 3.54 MIL/uL — ABNORMAL LOW (ref 3.87–5.11)
RDW: 11.3 % — ABNORMAL LOW (ref 11.5–15.5)
WBC: 2.5 10*3/uL — ABNORMAL LOW (ref 4.0–10.5)

## 2017-12-28 MED ORDER — CEFAZOLIN SODIUM-DEXTROSE 2-4 GM/100ML-% IV SOLN
2.0000 g | INTRAVENOUS | Status: AC
  Administered 2017-12-28: 2 g via INTRAVENOUS

## 2017-12-28 MED ORDER — CEFAZOLIN SODIUM-DEXTROSE 2-4 GM/100ML-% IV SOLN
INTRAVENOUS | Status: AC
  Administered 2017-12-28: 2 g via INTRAVENOUS
  Filled 2017-12-28: qty 100

## 2017-12-28 MED ORDER — MIDAZOLAM HCL 2 MG/2ML IJ SOLN
INTRAMUSCULAR | Status: AC
  Filled 2017-12-28: qty 4

## 2017-12-28 MED ORDER — FENTANYL CITRATE (PF) 100 MCG/2ML IJ SOLN
INTRAMUSCULAR | Status: AC | PRN
  Administered 2017-12-28: 50 ug via INTRAVENOUS

## 2017-12-28 MED ORDER — LIDOCAINE HCL 1 % IJ SOLN
INTRAMUSCULAR | Status: AC
  Filled 2017-12-28: qty 20

## 2017-12-28 MED ORDER — LIDOCAINE HCL (PF) 1 % IJ SOLN
INTRAMUSCULAR | Status: AC | PRN
  Administered 2017-12-28: 20 mL

## 2017-12-28 MED ORDER — MIDAZOLAM HCL 2 MG/2ML IJ SOLN
INTRAMUSCULAR | Status: AC | PRN
  Administered 2017-12-28: 1 mg via INTRAVENOUS

## 2017-12-28 MED ORDER — SODIUM CHLORIDE 0.9 % IV SOLN: INTRAVENOUS | Status: DC

## 2017-12-28 MED ORDER — FENTANYL CITRATE (PF) 100 MCG/2ML IJ SOLN
INTRAMUSCULAR | Status: AC
  Filled 2017-12-28: qty 4

## 2017-12-28 NOTE — Procedures (Signed)
Successful removal of right chest port.  Peripheral IV placed with [place].  Minimal blood loss and no immediate complication.

## 2017-12-28 NOTE — H&P (Signed)
Chief Complaint: Patient was seen in consultation today for [facility] removal at the request of Gorsuch,Ni  Referring Physician(s): Heath Lark  Supervising Physician: Markus Daft  Patient Status: [facility] - Out-pt  History of Present Illness: Victoria Bates is a 37 y.o. female   Oligodendroglioma resection 2015 Follows with Dr Alvy Bimler  [facility] placed in IR 09/2015 Has had "trouble" with [facility] for last yr Tachycardia; occasional pain/soreness She has no plans to keep [facility] or replace it at this point- regardless of treatment or findings  Dr Alvy Bimler has ordered removal   Past Medical History:  Diagnosis Date  . Anemia   . Anxiety   . Asthma    very mild- no inhaler use since July 2013  . Brain tumor ([place])    Hx: of  . Cancer ([place]) 2003   skin  . Chronic fatigue 10/03/2014  . Cognitive impairment 08/13/2015  . Complication of anesthesia    takes longer for patient to go under anesthesia  . Dizziness   . Dysphasia 02/12/2014  . 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 Headache 02/12/2014  . Headache(784.0)    migraines  . Herniated disc   . Hot flashes 10/03/2014  . Infection    to right head incision  . 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 Liver hemangioma   . Liver lesion 08/22/2014  . Nausea alone 02/12/2014  . Neuropathic pain 04/08/2014  . 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 Seizures ([place])    petit mal serizures most days    Past Surgical History:  Procedure Laterality Date  . ABDOMINAL HYSTERECTOMY    . CESAREAN SECTION    . COLONOSCOPY WITH PROPOFOL N/A 08/29/2012   Procedure: COLONOSCOPY WITH PROPOFOL;  Surgeon: Arta Silence, MD;  Location: [facility] ENDOSCOPY;  Service: Endoscopy;  Laterality: N/A;  . coposcopy  08/06/2012   cervical biopsy  . CRANIOTOMY Right 09/26/2013   Procedure: Awake Right frontal open brain biopsy;  Surgeon: Winfield Cunas, MD;  Location: [place] [facility]O [facility]S;  Service: Neurosurgery;  Laterality: Right;  awake Right frontal open brain biopsy  . CRANIOTOMY N/A  10/30/2013   Procedure: debridement of cranial wound  right temporal incision, ;  Surgeon: Winfield Cunas, MD;  Location: [place];  Service: Neurosurgery;  Laterality: N/A;  . CRANIOTOMY N/A 01/23/2014   Procedure: Awake Craniotomy with BrainLab;  Surgeon: Winfield Cunas, MD;  Location: [place] NEURO ORS;  Service: Neurosurgery;  Laterality: N/A;  Awake Craniotomy  tumor with BrainLab  . ESOPHAGOGASTRODUODENOSCOPY (EGD) WITH PROPOFOL N/A 08/29/2012   Procedure: ESOPHAGOGASTRODUODENOSCOPY (EGD) WITH PROPOFOL;  Surgeon: Arta Silence, MD;  Location: [facility] ENDOSCOPY;  Service: Endoscopy;  Laterality: N/A;  . left knee surgery  2000   torn meniscus  . right flank surgery  2005   bnign mass from flank area-right  . ROBOTIC ASSISTED TOTAL HYSTERECTOMY WITH BILATERAL SALPINGO OOPHERECTOMY Bilateral 09/25/2012   Procedure: ROBOTIC ASSISTED TOTAL HYSTERECTOMY WITH BILATERAL SALPINGO OOPHORECTOMY;  Surgeon: Maeola Sarah. Landry Mellow, MD;  Location: [place] [facility];  Service: Gynecology;  Laterality: Bilateral;  . ROTATOR CUFF REPAIR    . WISDOM TOOTH EXTRACTION      Allergies: Adhesive [tape]; Latex; Percocet [oxycodone-acetaminophen]; Shellfish allergy; Skin adhesives; Soma [carisoprodol]; Tramadol hcl; Vancomycin; and Zofran [ondansetron hcl]  Medications: Prior to Admission medications   Medication Sig Start Date End Date Taking? Authorizing Provider  amphetamine-dextroamphetamine (ADDERALL XR) 10 MG 24 hr capsule Take 1 capsule by mouth daily as needed (To help stay awake or focus).  03/31/17  Yes [provider]  aspirin 325 MG EC tablet Take 325 mg by mouth daily as needed (TACHYCARDIA).   Yes [provider]  butalbital-acetaminophen-caffeine (FIORICET, ESGIC) 50-325-40 MG tablet Take 1 tablet by mouth every 6 (six) hours as needed for headache. 12/22/17  Yes Alvy Bimler, Ni, MD  carisoprodol (SOMA) 350 MG tablet TAKE 1 TABLET BY MOUTH 4 TIMES DAILY FOR SEIZURE PREVENTION 07/24/17  Yes Heath Lark, MD  Cholecalciferol  (VITAMIN D) 2000 units CAPS Take 2,000 Units by mouth daily.   Yes [provider]  Cyanocobalamin (VITAMIN B-12 PO) Take 5,000 Units daily by mouth.    Yes [provider]  divalproex (DEPAKOTE ER) 250 MG 24 hr tablet Take 3 tabs twice a day Patient taking differently: Take 500-750 mg by mouth See admin instructions. Takes 2 tabs in the morning, and 3 tabs in the evening 12/14/17  Yes Cameron Sprang, MD  dronabinol (MARINOL) 10 MG capsule TAKE 1 CAPSULE BY MOUTH TWICE DAILY BEFORE MEALS Patient taking differently: Take 10 mg by mouth 2 (two) times daily as needed (To help weight gain). TAKE 1 CAPSULE BY MOUTH TWICE DAILY BEFORE MEALS 11/27/17  Yes Alvy Bimler, Ni, MD  gabapentin (NEURONTIN) 300 MG capsule Take 3 caps in AM, 4 caps in PM 12/14/17  Yes Cameron Sprang, MD  HYDROcodone-acetaminophen (NORCO) 10-325 MG tablet Take 1 tablet by mouth every 4 (four) hours as needed for moderate pain. 12/22/17  Yes Gorsuch, Ni, MD  levocetirizine (XYZAL) 5 MG tablet Take 1 tablet (5 mg total) by mouth every evening. 04/20/17  Yes Gorsuch, Ni, MD  lidocaine-prilocaine (EMLA) cream Apply 1 application topically as needed (local anesthesia).   Yes [provider]  LORazepam (ATIVAN) 1 MG tablet Take 1 tablet (1 mg total) by mouth every 8 (eight) hours as needed for anxiety. 11/27/17  Yes Gorsuch, Ni, MD  montelukast (SINGULAIR) 10 MG tablet TAKE 1 TABLET BY MOUTH ONCE DAILY AT BEDTIME 11/13/17  Yes Gorsuch, Ni, MD  promethazine (PHENERGAN) 25 MG tablet Take 25 mg every 6 (six) hours as needed by mouth for nausea or vomiting.   Yes [provider]     Family History  Problem Relation Age of Onset  . Hypertension Mother   . Skin cancer Mother   . Thyroid cancer Father   . Skin cancer Father   . Breast cancer Maternal Aunt        dx in her 76s  . Prostate cancer Maternal Uncle 63  . Melanoma Maternal Uncle   . Ovarian cancer Maternal Grandmother        dx in her late 46s-40s  .  Breast cancer Maternal Aunt        bilateral breast cancer dx in 51s and 31s; possibly BRCA+  . Other Son        multiple CAL spots, bright spots on brain MRI, ? chiari malformation    Social History   Socioeconomic History  . Marital status: Significant Other    Spouse name: Not on file  . Number of children: 2  . Years of education: College  . Highest education level: Not on file  Occupational History  . Occupation: Engineer, mining: [employer]  Social Needs  . Financial resource strain: Not on file  . Food insecurity:    Worry: Not on file    Inability: Not on file  . Transportation needs:    Medical: Not on file    Non-medical: Not  on file  Tobacco Use  . Smoking status: Never Smoker  . Smokeless tobacco: Never Used  Substance and Sexual Activity  . Alcohol use: No    Alcohol/week: 0.0 oz  . Drug use: No  . Sexual activity: Yes    Birth control/protection: None  Lifestyle  . Physical activity:    Days per week: Not on file    Minutes per session: Not on file  . Stress: Not on file  Relationships  . Social connections:    Talks on phone: Not on file    Gets together: Not on file    Attends religious service: Not on file    Active member of club or organization: Not on file    Attends meetings of clubs or organizations: Not on file    Relationship status: Not on file  Other Topics Concern  . Not on file  Social History Narrative   Patient lives at home with her two children.   Caffeine use: minimum of 4 cups daily     Review of Systems: A 12 point ROS discussed and pertinent positives are indicated in the HPI above.  All other systems are negative.  Review of Systems  Constitutional: Negative for activity change, fatigue and fever.  Respiratory: Negative for shortness of breath.   Cardiovascular: Negative for chest pain.  Gastrointestinal: Negative for abdominal pain.  Neurological: Negative for dizziness, weakness and headaches.    Psychiatric/Behavioral: Negative for behavioral problems and confusion.    Vital Signs: BP (!) 132/98   Pulse 93   Temp 98.8 F (37.1 C)   Resp 18   Ht _0  (1.753 m)   Wt 136 lb (61.7 kg)   LMP 09/20/2012   SpO2 100%   BMI 20.08 kg/m   Physical Exam  Constitutional: She is oriented to person, place, and time.  Cardiovascular: Normal rate, regular rhythm and normal heart sounds.  Pulmonary/Chest: Effort normal and breath sounds normal.  Abdominal: Soft. Bowel sounds are normal.  Musculoskeletal: Normal range of motion.  Neurological: She is alert and oriented to person, place, and time.  Skin: Skin is warm and dry.  Psychiatric: She has a normal mood and affect. Her behavior is normal. Judgment and thought content normal.  Nursing note and vitals reviewed.   Imaging: No results found.  Labs:  CBC: Recent Labs    06/15/17 1207 08/10/17 1201 09/21/17 1252 11/13/17 1034  WBC 2.5* 4.4 2.6* 3.0*  HGB 11.7 11.7 12.0 11.0*  HCT 34.4* 34.6* 35.4 31.6*  PLT 199 196 187 148    COAGS: No results for input(s): INR, APTT in the last 8760 hours.  BMP: Recent Labs    05/04/17 1849 06/15/17 1207 08/10/17 1201 09/21/17 1252  NA 135 140 138 139  K 3.7 3.9 4.3 4.0  CL 105  --  106 104  CO2 _1 GLUCOSE 98 85 89 88  BUN 7 5.8* 8 11  CALCIUM 8.9 8.5 8.6 9.5  CREATININE 0.54 0.7 0.68 0.71  GFRNONAA >60  --  >60 >60  GFRAA >60  --  >60 >60    LIVER FUNCTION TESTS: Recent Labs    04/20/17 1140 06/15/17 1207 08/10/17 1201 09/21/17 1252  BILITOT 0.44 0.36 <0.2* 0.4  AST _2 ALT _3 ALKPHOS 42 42 46 49  PROT 7.0 6.7 6.6 6.8  ALBUMIN 4.2 3.9 3.8 3.7    TUMOR MARKERS: No results for input(s): AFPTM, CEA,  CA199, CHROMGRNA in the last 8760 hours.  Assessment and Plan:  Oligodendroglioma resection 2015 PAC placed 09/2015 Tachy and pain-- x 1 yr Is not going to use anymore anyway-- MD has ordered removal ptr is aware of procedure  benefits and risks including but not limited to Infection; bleeding; vessel damage Agreeable to proceed Consent signed and in chart  Thank you for this interesting consult.  I greatly enjoyed meeting Victoria Bates and look forward to participating in their care.  A copy of this report was sent to the requesting provider on this date.  Electronically Signed: Lavonia Drafts, PA-C 12/28/2017, 9:48 AM   I spent a total of  30 Minutes   in face to face in clinical consultation, greater than 50% of which was counseling/coordinating care for [facility] removal

## 2017-12-28 NOTE — Telephone Encounter (Signed)
I have left a message for Victoria Bates to return our call.

## 2017-12-28 NOTE — Discharge Instructions (Signed)
Implanted Port Removal, Care After °Refer to this sheet in the next few weeks. These instructions provide you with information about caring for yourself after your procedure. Your health care provider may also give you more specific instructions. Your treatment has been planned according to current medical practices, but problems sometimes occur. Call your health care provider if you have any problems or questions after your procedure. °What can I expect after the procedure? °After the procedure, it is common to have: °· Soreness or pain near your incision. °· Some swelling or bruising near your incision. ° °Follow these instructions at home: °Medicines °· Take over-the-counter and prescription medicines only as told by your health care provider. °· If you were prescribed an antibiotic medicine, take it as told by your health care provider. Do not stop taking the antibiotic even if you start to feel better. °Bathing °· Do not take baths, swim, or use a hot tub until your health care provider approves. Ask your health care provider if you can take showers. You may only be allowed to take sponge baths for bathing. °Incision care °· Follow instructions from your health care provider about how to take care of your incision. Make sure you: °? Wash your hands with soap and water before you change your bandage (dressing). If soap and water are not available, use hand sanitizer. °? Change your dressing as told by your health care provider. °? Keep your dressing dry. °? Leave stitches (sutures), skin glue, or adhesive strips in place. These skin closures may need to stay in place for 2 weeks or longer. If adhesive strip edges start to loosen and curl up, you may trim the loose edges. Do not remove adhesive strips completely unless your health care provider tells you to do that. °· Check your incision area every day for signs of infection. Check for: °? More redness, swelling, or pain. °? More fluid or  blood. °? Warmth. °? Pus or a bad smell. °Driving °· If you received a sedative, do not drive for 24 hours after the procedure. °· If you did not receive a sedative, ask your health care provider when it is safe to drive. °Activity °· Return to your normal activities as told by your health care provider. Ask your health care provider what activities are safe for you. °· Until your health care provider says it is safe: °? Do not lift anything that is heavier than 10 lb (4.5 kg). °? Do not do activities that involve lifting your arms over your head. °General instructions °· Do not use any tobacco products, such as cigarettes, chewing tobacco, and e-cigarettes. Tobacco can delay healing. If you need help quitting, ask your health care provider. °· Keep all follow-up visits as told by your health care provider. This is important. °Contact a health care provider if: °· You have more redness, swelling, or pain around your incision. °· You have more fluid or blood coming from your incision. °· Your incision feels warm to the touch. °· You have pus or a bad smell coming from your incision. °· You have a fever. °· You have pain that is not relieved by your pain medicine. °Get help right away if: °· You have chest pain. °· You have difficulty breathing. °This information is not intended to replace advice given to you by your health care provider. Make sure you discuss any questions you have with your health care provider. °Document Released: 06/15/2015 Document Revised: 12/10/2015 Document Reviewed: 04/08/2015 °Elsevier Interactive Patient   Education © 2018 [employer] ° °

## 2017-12-29 ENCOUNTER — Ambulatory Visit (HOSPITAL_COMMUNITY): Admission: RE | Admit: 2017-12-29 | Payer: Medicare Other | Source: Ambulatory Visit

## 2017-12-29 ENCOUNTER — Other Ambulatory Visit (HOSPITAL_COMMUNITY): Payer: Self-pay | Admitting: Radiology

## 2017-12-29 ENCOUNTER — Telehealth: Payer: Self-pay | Admitting: Radiology

## 2017-12-29 ENCOUNTER — Telehealth: Payer: Self-pay

## 2017-12-29 DIAGNOSIS — D496 Neoplasm of unspecified behavior of brain: Secondary | ICD-10-CM

## 2017-12-29 LAB — EXTRACTABLE NUCLEAR ANTIGEN ANTIBODY
ENA RNP Ab: 0.3 AI (ref 0.0–0.9)
ENA SM Ab Ser-aCnc: <0.2 AI (ref 0.0–0.9)
ENA SSA (RO) Ab: <0.2 AI (ref 0.0–0.9)
ENA SSB (LA) Ab: <0.2 AI (ref 0.0–0.9)
Scleroderma SCL-70: <0.2 AI (ref 0.0–0.9)
dsDNA Ab: 1 IU/mL (ref 0–9)

## 2017-12-29 NOTE — Progress Notes (Signed)
  I did reassure pt that bruising is not uncommon with PAC removal Ice is best remedy for now  But would like her to come in for evaluation if she could  [facility] is able to see pt now if she can  Again, LM for her to go to [facility] Radiology asap

## 2017-12-29 NOTE — Telephone Encounter (Signed)
Call received from Gabriel Cirri in reference to previous call information.  "Spoke with P.A. For interventional radiology, said everything I decribed is normal.  Called back two minutes later with a message to come ASAP.  Already informed them I have three kids with me I can't leave alone.  Transferred to IR for best help and assistance with this matter.  Answered questions as to why scheduled so urgently, is this normal.  Denies increase in size of swelled or bruised areas.  Denies fever.  Concurred with IR of 'normalcy' however best to assess to ensure nothing wrong with impending start of weekend.

## 2017-12-29 NOTE — Telephone Encounter (Signed)
Left message for Victoria Bates to call back to discuss this matter.

## 2017-12-29 NOTE — Telephone Encounter (Signed)
She called and left message to call her. She had port removed yesterday.  Called back. She had port removed yesterday at [facility] IR. She did not get instructions and does not know when to remove dressing. She has swelling to the port site and her arm pit area. She has been using ice. She is taking Advil 600 mg every 6 hour prn along with her pain medication. She had some bleeding and pressure was applied yesterday during removal. They applied glue and steri-strips to port site. Dressing looks the same as yesterday. Instructed to call [facility] IR and get instructions. She verbalized understanding. Instructed to call if needed.

## 2017-12-29 NOTE — Progress Notes (Signed)
  Pt was seen yesterday for [facility] removal with Dr Anselm Pancoast  She states she is having significant pain at site and beneath right breast Nipple is purple color Dr Anselm Pancoast placed a temporary PICC - [facility] was unable to get an IV This site is also tender and swollen  She has used Ibuprofen 600 mg and Vicodin-- without much pain relief  Ice does seem to help at these areas  Pt is to see [facility] PA/IR for evaluation asap She is agreeable  Called pt 3x-to inform her to be at [place] in IR department No answer- so I left message

## 2017-12-29 NOTE — Telephone Encounter (Signed)
Patient will be seen on 6/17. Will close this encounter since the results are already in her chart.

## 2018-01-01 ENCOUNTER — Ambulatory Visit: Payer: Medicare Other | Admitting: Pulmonary Disease

## 2018-01-02 ENCOUNTER — Encounter: Payer: Self-pay | Admitting: Hematology and Oncology

## 2018-01-02 ENCOUNTER — Inpatient Hospital Stay: Payer: Medicare Other | Admitting: Hematology and Oncology

## 2018-01-03 ENCOUNTER — Telehealth: Payer: Self-pay | Admitting: Hematology and Oncology

## 2018-01-03 NOTE — Telephone Encounter (Signed)
Called pt re appt being added per 6/18 sch msg - left vm for pt re appts.

## 2018-01-04 ENCOUNTER — Other Ambulatory Visit: Payer: Self-pay

## 2018-01-04 ENCOUNTER — Encounter: Payer: Self-pay | Admitting: Hematology and Oncology

## 2018-01-04 ENCOUNTER — Inpatient Hospital Stay: Payer: Medicare Other | Attending: Hematology and Oncology | Admitting: Hematology and Oncology

## 2018-01-04 ENCOUNTER — Telehealth: Payer: Self-pay | Admitting: Hematology and Oncology

## 2018-01-04 DIAGNOSIS — G629 Polyneuropathy, unspecified: Secondary | ICD-10-CM | POA: Diagnosis not present

## 2018-01-04 DIAGNOSIS — G44021 Chronic cluster headache, intractable: Secondary | ICD-10-CM | POA: Diagnosis not present

## 2018-01-04 DIAGNOSIS — R233 Spontaneous ecchymoses: Secondary | ICD-10-CM

## 2018-01-04 DIAGNOSIS — R519 Headache, unspecified: Secondary | ICD-10-CM

## 2018-01-04 DIAGNOSIS — R569 Unspecified convulsions: Secondary | ICD-10-CM | POA: Diagnosis not present

## 2018-01-04 DIAGNOSIS — R202 Paresthesia of skin: Secondary | ICD-10-CM

## 2018-01-04 DIAGNOSIS — R0789 Other chest pain: Secondary | ICD-10-CM

## 2018-01-04 DIAGNOSIS — F419 Anxiety disorder, unspecified: Secondary | ICD-10-CM | POA: Diagnosis not present

## 2018-01-04 DIAGNOSIS — D61818 Other pancytopenia: Secondary | ICD-10-CM | POA: Insufficient documentation

## 2018-01-04 DIAGNOSIS — R110 Nausea: Secondary | ICD-10-CM

## 2018-01-04 DIAGNOSIS — R4702 Dysphasia: Secondary | ICD-10-CM

## 2018-01-04 DIAGNOSIS — C719 Malignant neoplasm of brain, unspecified: Secondary | ICD-10-CM

## 2018-01-04 DIAGNOSIS — C711 Malignant neoplasm of frontal lobe: Secondary | ICD-10-CM | POA: Insufficient documentation

## 2018-01-04 DIAGNOSIS — R51 Headache: Secondary | ICD-10-CM

## 2018-01-04 DIAGNOSIS — R238 Other skin changes: Secondary | ICD-10-CM

## 2018-01-04 DIAGNOSIS — G40109 Localization-related (focal) (partial) symptomatic epilepsy and epileptic syndromes with simple partial seizures, not intractable, without status epilepticus: Secondary | ICD-10-CM

## 2018-01-04 MED ORDER — CARISOPRODOL 350 MG PO TABS: ORAL_TABLET | ORAL | 1 refills | Status: DC

## 2018-01-04 MED ORDER — LORAZEPAM 1 MG PO TABS: 1.0000 mg | ORAL_TABLET | Freq: Three times a day (TID) | ORAL | 0 refills | Status: DC | PRN

## 2018-01-04 MED ORDER — HYDROCODONE-ACETAMINOPHEN 10-325 MG PO TABS: 1.0000 | ORAL_TABLET | ORAL | 0 refills | Status: DC | PRN

## 2018-01-04 MED FILL — HYDROCODON-APAP 10-325: 10-325 | 15 days supply | Qty: 90 | Fill #0

## 2018-01-04 MED FILL — LORazepam 1 MG TABS: 1 | 30 days supply | Qty: 90 | Fill #0

## 2018-01-04 NOTE — Progress Notes (Signed)
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  Intractable chronic cluster headache She has poorly controlled headaches She is taking hydrocodone and Fioricet as needed She is also taking gabapentin for neuropathic pain She will continue her current medications as prescribed  Easy bruising She had chronic easy bruising Discontinue aspirin from her medication list Observe only  Pancytopenia, acquired ([place]) She has chronic mild pancytopenia Recent serum vitamin B12 was adequate I recommend multivitamin supplement   No orders of the defined types were placed in this encounter.   INTERVAL HISTORY: Please see below for problem oriented charting. She returns for further follow-up She had recent traumatic bleeding and bruising after port removal The patient denies any recent signs or symptoms of bleeding such as spontaneous epistaxis, hematuria or hematochezia. 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      09/27/2013 Pathology Results    Accession: [id number] pathology showed grade 2 oligodendroglioma with mutant IDH1 protein, ATRX, OLIG 2 expression. p53 is negative, low Ki-67 labeling index. 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      11/06/2013 Imaging    Repeat CT scan of the head showed resolution of abscess.      01/23/2014 Surgery    Dr. Christella Noa perform a subtotal gross resection of the tumor.      01/24/2014 Imaging    Repeat imaging studies showed persistent and residual disease.      03/03/2014 - 09/01/2014 Chemotherapy    She was started on Temodar, 5 days every 28 days      03/13/2014 Imaging    Repeat MRI showed no evidence of progression of disease.      07/14/2014 Imaging    Repeat MRI showed no evidence of disease progression.      09/12/2014 Imaging    MRI of the liver show mild regression of the size of lesions, presumed to be benign      09/15/2014 Imaging    Repeat MRI of the  brain show persistent abnormalities, suspicious for residual disease      03/02/2015 Imaging     MRI brain at [facility] show persistent abnormalities      08/10/2015 Imaging    Repeat MRI [facility] showed stable disease      08/24/2015 - 02/08/2016 Chemotherapy    She was started on Lomustine, Procarbazine and Vincristine chemotherapy       08/30/2015 Imaging    CT chest showed no evidence of aspiration pneumonia      11/18/2015 Imaging    MRI brain showed status post resection of right frontal lobe tumor without residual or recurrent tumor. 2. Otherwise negative MRI of the brain.       03/02/2016 Imaging    Stable MRI post right frontal lobe tumor resection. Negative for recurrent tumor      06/01/2016 Imaging    Stable MRI post right frontal lobe tumor resection. No recurrent tumor identified      08/19/2016 Imaging    Unchanged examination without tumor recurrence.      11/17/2016 Imaging    Stable right frontal resection cavity. Stable focal mass effect within the right middle frontal gyrus at the superior margin of the cavity which may represent nonenhancing neoplasm. No evidence for tumor progression. Otherwise stable MRI of the brain      12/16/2016 Imaging    MR brain: 1. No acute intracranial abnormality. 2. Unchanged appearance of right frontal lobe resection cavity and surrounding hyperintense T2 weighted signal.      02/04/2017 Imaging    Repeat MRI Normal pituitary  Stable postop changes of right frontal tumor resection      05/12/2017 Imaging    The left ventricular size is normal. There is normal left ventricular wall thickness.  Left ventricular systolic function is normal. LV ejection fraction = 60-65%.  Left ventricular filling pattern is normal. The left ventricular wall motion is normal. The right ventricle is normal in size and function. There is no significant valvular stenosis or regurgitation No pulmonary hypertension. IVC size was normal. There is no pericardial effusion. There is no comparison study available      05/13/2017 Imaging    1.No evidence of acute pulmonary thromboembolic disease. 2.There is an 8 mm right lower lobe groundglass nodule. Statistically, in this age group, this is most likely to reflect benign etiologies such as a tiny focus of infection or nonspecific  inflammation. 4 mm solid right upper lobe lung nodule. A CT scan could be obtained in 12 months to further evaluate if the patient has any known risk factors for malignancy. 3.There is a 1.3 cm left adrenal nodule. This finding is incompletely characterized although and is slightly increased in size relative to the abdominal MRI from February 2016. Although this finding is most likely benign, recommend follow-up adrenal protocol CT in one year.      05/13/2017 Imaging    MRI brain 1.No acute intracranial abnormality. 2.Postsurgical changes of a right frontal craniotomy for resection of a reported oligodendroglioma. Hyperintense T2/FLAIR signal around the resection cavity and in the subcortical white matter of a gyrus along the superior margin of the cavity is unchanged from 01/05/2015. No evidence of disease progression.      12/15/2017 Imaging    MRI brain at [facility] 1.No acute intracranial abnormality. 2.Postsurgical changes of right frontal craniotomy for resection of reported oligodendroglioma. No evidence of disease progression      12/28/2017 Procedure    Successful right IJ vein Port-A-Cath explant.  REVIEW OF SYSTEMS:   Constitutional: Denies fevers, chills or abnormal weight loss Eyes: Denies blurriness of vision Ears, nose, mouth, throat, and face: Denies mucositis or sore throat Respiratory: Denies cough, dyspnea or wheezes Cardiovascular: Denies palpitation, chest discomfort or lower extremity swelling Gastrointestinal:  Denies nausea, heartburn or change in bowel habits Skin: Denies abnormal skin rashes Lymphatics: Denies new lymphadenopathy  Neurological:Denies numbness, tingling or new weaknesses Behavioral/Psych: Mood is stable, no new changes  All other systems were reviewed with the patient and are negative.  I have reviewed the past medical history, past surgical history, social history and family history with the patient and they are unchanged  from previous note.  ALLERGIES:  is allergic to adhesive [tape]; latex; percocet [oxycodone-acetaminophen]; shellfish allergy; skin adhesives; soma [carisoprodol]; tramadol hcl; vancomycin; and zofran [ondansetron hcl].  MEDICATIONS:  Current Outpatient Medications  Medication Sig Dispense Refill  . butalbital-acetaminophen-caffeine (FIORICET, ESGIC) 50-325-40 MG tablet Take 1 tablet by mouth every 6 (six) hours as needed for headache. 90 tablet 0  . carisoprodol (SOMA) 350 MG tablet TAKE 1 TABLET BY MOUTH 4 TIMES DAILY FOR SEIZURE PREVENTION 360 tablet 1  . Cholecalciferol (VITAMIN D) 2000 units CAPS Take 2,000 Units by mouth daily.    . Cyanocobalamin (VITAMIN B-12 PO) Take 5,000 Units daily by mouth.     . divalproex (DEPAKOTE ER) 250 MG 24 hr tablet Take 3 tabs twice a day (Patient taking differently: Take 500-750 mg by mouth See admin instructions. Takes 2 tabs in the morning, and 3 tabs in the evening) 540 tablet 3  . dronabinol (MARINOL) 10 MG capsule TAKE 1 CAPSULE BY MOUTH TWICE DAILY BEFORE MEALS (Patient taking differently: Take 10 mg by mouth 2 (two) times daily as needed (To help weight gain). TAKE 1 CAPSULE BY MOUTH TWICE DAILY BEFORE MEALS) 60 capsule 0  . gabapentin (NEURONTIN) 300 MG capsule Take 3 caps in AM, 4 caps in PM 630 capsule 3  . HYDROcodone-acetaminophen (NORCO) 10-325 MG tablet Take 1 tablet by mouth every 4 (four) hours as needed for moderate pain. 90 tablet 0  . levocetirizine (XYZAL) 5 MG tablet Take 1 tablet (5 mg total) by mouth every evening. 60 tablet 5  . LORazepam (ATIVAN) 1 MG tablet Take 1 tablet (1 mg total) by mouth every 8 (eight) hours as needed for anxiety. 90 tablet 0  . montelukast (SINGULAIR) 10 MG tablet TAKE 1 TABLET BY MOUTH ONCE DAILY AT BEDTIME 90 tablet 3  . promethazine (PHENERGAN) 25 MG tablet Take 25 mg every 6 (six) hours as needed by mouth for nausea or vomiting.     No current facility-administered medications for this visit.      PHYSICAL EXAMINATION: ECOG PERFORMANCE STATUS: 1 - Symptomatic but completely ambulatory  Vitals:   01/04/18 1305  BP: 120/76  Pulse: (!) 113  Resp: 18  Temp: 97.9 F (36.6 C)  SpO2: 100%   Filed Weights   01/04/18 1305  Weight: 141 lb 14.4 oz (64.4 kg)    GENERAL:alert, no distress and comfortable SKIN: skin color, texture, turgor are normal, no rashes or significant lesions. Noted some old bruises EYES: normal, Conjunctiva are pink and non-injected, sclera clear OROPHARYNX:no exudate, no erythema and lips, buccal mucosa, and tongue normal  NECK: supple, thyroid normal size, non-tender, without nodularity LYMPH:  no palpable lymphadenopathy in the cervical, axillary or inguinal LUNGS: clear to auscultation and percussion with normal breathing effort HEART: regular rate & rhythm and no murmurs and no lower extremity edema  ABDOMEN:abdomen soft, non-tender and normal bowel sounds Musculoskeletal:no cyanosis of digits and no clubbing  NEURO: alert & oriented x 3 with occasional stuttering, no focal motor/sensory deficits  LABORATORY DATA:  I have reviewed the data as listed    Component Value Date/Time   NA 139 09/21/2017 1252   NA 140 06/15/2017 1207   K 4.0 09/21/2017 1252   K 3.9 06/15/2017 1207   CL 104 09/21/2017 1252   CO2 28 09/21/2017 1252   CO2 24 06/15/2017 1207   GLUCOSE 88 09/21/2017 1252   GLUCOSE 85 06/15/2017 1207   BUN 11 09/21/2017 1252   BUN 5.8 (L) 06/15/2017 1207   CREATININE 0.71 09/21/2017 1252   CREATININE 0.7 06/15/2017 1207   CALCIUM 9.5 09/21/2017 1252   CALCIUM 8.5 06/15/2017 1207   PROT 6.8 09/21/2017 1252   PROT 6.7 06/15/2017 1207   ALBUMIN 3.7 09/21/2017 1252   ALBUMIN 3.9 06/15/2017 1207   AST 17 09/21/2017 1252   AST 14 06/15/2017 1207   ALT 18 09/21/2017 1252   ALT 11 06/15/2017 1207   ALKPHOS 49 09/21/2017 1252   ALKPHOS 42 06/15/2017 1207   BILITOT 0.4 09/21/2017 1252   BILITOT 0.36 06/15/2017 1207   GFRNONAA >60  09/21/2017 1252   GFRAA >60 09/21/2017 1252    No results found for: SPEP, UPEP  Lab Results  Component Value Date   WBC 2.5 (L) 12/28/2017   NEUTROABS 2.1 11/13/2017   HGB 11.7 (L) 12/28/2017   HCT 35.3 (L) 12/28/2017   MCV 99.7 12/28/2017   PLT 155 12/28/2017      Chemistry      Component Value Date/Time   NA 139 09/21/2017 1252   NA 140 06/15/2017 1207   K 4.0 09/21/2017 1252   K 3.9 06/15/2017 1207   CL 104 09/21/2017 1252   CO2 28 09/21/2017 1252   CO2 24 06/15/2017 1207   BUN 11 09/21/2017 1252   BUN 5.8 (L) 06/15/2017 1207   CREATININE 0.71 09/21/2017 1252   CREATININE 0.7 06/15/2017 1207      Component Value Date/Time   CALCIUM 9.5 09/21/2017 1252   CALCIUM 8.5 06/15/2017 1207   ALKPHOS 49 09/21/2017 1252   ALKPHOS 42 06/15/2017 1207   AST 17 09/21/2017 1252   AST 14 06/15/2017 1207   ALT 18 09/21/2017 1252   ALT 11 06/15/2017 1207   BILITOT 0.4 09/21/2017 1252   BILITOT 0.36 06/15/2017 1207       RADIOGRAPHIC STUDIES: I have personally reviewed the radiological images as listed and agreed with the findings in the report. Ir Removal [employer] W/o Fl  Result Date: 12/28/2017 INDICATION: 37 year old with history of a brain tumor and right chest Port-A-Cath. Patient has been having tachycardia episodes and there is concern the tachycardia may be associated with the Port-A-Cath. Patient reports problems with the port aspirating sometimes. Request for Port-A-Cath removal. Port-A-Cath was placed by Dr. Earleen Newport on 10/02/2015. EXAM: REMOVAL RIGHT IJ VEIN PORT-A-CATH ULTRASOUND GUIDANCE FOR PERIPHERAL IV PLACEMENT MEDICATIONS: Ancef 2 g; The antibiotic was administered within an appropriate time interval prior to skin puncture. ANESTHESIA/SEDATION: Moderate (conscious) sedation was employed during this procedure. A total of Versed 1.0 mg and Fentanyl 50 mcg was administered intravenously. Moderate Sedation Time: 20 minutes. The patient's level of consciousness  and vital signs were monitored continuously by radiology nursing throughout the procedure under my direct supervision. FLUOROSCOPY TIME:  Fluoroscopy Time: 6 seconds COMPLICATIONS: None immediate. PROCEDURE: A peripheral IV could not  be placed due to poor venous access. Therefore, the left arm was evaluated with ultrasound. Ultrasound confirmed a patent left basilic vein. Ultrasound image was saved for documentation. The left arm was prepped and draped in sterile fashion. Maximal barrier sterile technique was utilized including caps, mask, sterile gowns, sterile gloves, sterile drape, hand hygiene and skin antiseptic. Skin was anesthetized with 1% lidocaine. 21 gauge needle directed into the left basilic vein with ultrasound guidance. A wire advanced easily into the vein. Micropuncture dilator set was placed. Catheter aspirated and flushed well. This peripheral venous access was used for the port removal procedure. Informed written consent was obtained from the after a thorough discussion of the procedural risks, benefits and alternatives. All questions were addressed. Maximal Sterile Barrier Technique was utilized including caps, mask, sterile gowns, sterile gloves, sterile drape, hand hygiene and skin antiseptic. A timeout was performed prior to the initiation of the procedure. The right chest was prepped and draped in a sterile fashion. Lidocaine was utilized for local anesthesia. An incision was made over the previously healed surgical incision. Utilizing blunt dissection, the port catheter and reservoir were removed from the underlying subcutaneous tissue in their entirety. Securing sutures were also removed. The pocket was irrigated with a copious amount of sterile normal saline. The subcutaneous tissue was closed with 3-0 Vicryl interrupted subcutaneous stitches. A 4-0 Vicryl running subcuticular stitch was utilized to approximate the skin. Dermabond was applied. FINDINGS: Prior to port removal, a  fluoroscopic image was taken to evaluate the catheter tip based on the history of tachycardia. The catheter tip remains well positioned at the superior cavoatrial junction and unchanged from the original placement images. IMPRESSION: Successful right IJ vein Port-A-Cath explant. Electronically Signed   By: Markus Daft M.D.   On: 12/28/2017 12:28   Ir US Guide Vasc Access Left  Result Date: 12/28/2017 INDICATION: 37 year old with history of a brain tumor and right chest Port-A-Cath. Patient has been having tachycardia episodes and there is concern the tachycardia may be associated with the Port-A-Cath. Patient reports problems with the port aspirating sometimes. Request for Port-A-Cath removal. Port-A-Cath was placed by Dr. Earleen Newport on 10/02/2015. EXAM: REMOVAL RIGHT IJ VEIN PORT-A-CATH ULTRASOUND GUIDANCE FOR PERIPHERAL IV PLACEMENT MEDICATIONS: Ancef 2 g; The antibiotic was administered within an appropriate time interval prior to skin puncture. ANESTHESIA/SEDATION: Moderate (conscious) sedation was employed during this procedure. A total of Versed 1.0 mg and Fentanyl 50 mcg was administered intravenously. Moderate Sedation Time: 20 minutes. The patient's level of consciousness and vital signs were monitored continuously by radiology nursing throughout the procedure under my direct supervision. FLUOROSCOPY TIME:  Fluoroscopy Time: 6 seconds COMPLICATIONS: None immediate. PROCEDURE: A peripheral IV could not be placed due to poor venous access. Therefore, the left arm was evaluated with ultrasound. Ultrasound confirmed a patent left basilic vein. Ultrasound image was saved for documentation. The left arm was prepped and draped in sterile fashion. Maximal barrier sterile technique was utilized including caps, mask, sterile gowns, sterile gloves, sterile drape, hand hygiene and skin antiseptic. Skin was anesthetized with 1% lidocaine. 21 gauge needle directed into the left basilic vein with ultrasound guidance. A  wire advanced easily into the vein. Micropuncture dilator set was placed. Catheter aspirated and flushed well. This peripheral venous access was used for the port removal procedure. Informed written consent was obtained from the after a thorough discussion of the procedural risks, benefits and alternatives. All questions were addressed. Maximal Sterile Barrier Technique was utilized including caps, mask, sterile gowns, sterile  gloves, sterile drape, hand hygiene and skin antiseptic. A timeout was performed prior to the initiation of the procedure. The right chest was prepped and draped in a sterile fashion. Lidocaine was utilized for local anesthesia. An incision was made over the previously healed surgical incision. Utilizing blunt dissection, the port catheter and reservoir were removed from the underlying subcutaneous tissue in their entirety. Securing sutures were also removed. The pocket was irrigated with a copious amount of sterile normal saline. The subcutaneous tissue was closed with 3-0 Vicryl interrupted subcutaneous stitches. A 4-0 Vicryl running subcuticular stitch was utilized to approximate the skin. Dermabond was applied. FINDINGS: Prior to port removal, a fluoroscopic image was taken to evaluate the catheter tip based on the history of tachycardia. The catheter tip remains well positioned at the superior cavoatrial junction and unchanged from the original placement images. IMPRESSION: Successful right IJ vein Port-A-Cath explant. Electronically Signed   By: Markus Daft M.D.   On: 12/28/2017 12:28   Ir Radiology Peripheral Guided Iv Start  Result Date: 12/28/2017 INDICATION: 37 year old with history of a brain tumor and right chest Port-A-Cath. Patient has been having tachycardia episodes and there is concern the tachycardia may be associated with the Port-A-Cath. Patient reports problems with the port aspirating sometimes. Request for Port-A-Cath removal. Port-A-Cath was placed by Dr. Earleen Newport on  10/02/2015. EXAM: REMOVAL RIGHT IJ VEIN PORT-A-CATH ULTRASOUND GUIDANCE FOR PERIPHERAL IV PLACEMENT MEDICATIONS: Ancef 2 g; The antibiotic was administered within an appropriate time interval prior to skin puncture. ANESTHESIA/SEDATION: Moderate (conscious) sedation was employed during this procedure. A total of Versed 1.0 mg and Fentanyl 50 mcg was administered intravenously. Moderate Sedation Time: 20 minutes. The patient's level of consciousness and vital signs were monitored continuously by radiology nursing throughout the procedure under my direct supervision. FLUOROSCOPY TIME:  Fluoroscopy Time: 6 seconds COMPLICATIONS: None immediate. PROCEDURE: A peripheral IV could not be placed due to poor venous access. Therefore, the left arm was evaluated with ultrasound. Ultrasound confirmed a patent left basilic vein. Ultrasound image was saved for documentation. The left arm was prepped and draped in sterile fashion. Maximal barrier sterile technique was utilized including caps, mask, sterile gowns, sterile gloves, sterile drape, hand hygiene and skin antiseptic. Skin was anesthetized with 1% lidocaine. 21 gauge needle directed into the left basilic vein with ultrasound guidance. A wire advanced easily into the vein. Micropuncture dilator set was placed. Catheter aspirated and flushed well. This peripheral venous access was used for the port removal procedure. Informed written consent was obtained from the after a thorough discussion of the procedural risks, benefits and alternatives. All questions were addressed. Maximal Sterile Barrier Technique was utilized including caps, mask, sterile gowns, sterile gloves, sterile drape, hand hygiene and skin antiseptic. A timeout was performed prior to the initiation of the procedure. The right chest was prepped and draped in a sterile fashion. Lidocaine was utilized for local anesthesia. An incision was made over the previously healed surgical incision. Utilizing blunt  dissection, the port catheter and reservoir were removed from the underlying subcutaneous tissue in their entirety. Securing sutures were also removed. The pocket was irrigated with a copious amount of sterile normal saline. The subcutaneous tissue was closed with 3-0 Vicryl interrupted subcutaneous stitches. A 4-0 Vicryl running subcuticular stitch was utilized to approximate the skin. Dermabond was applied. FINDINGS: Prior to port removal, a fluoroscopic image was taken to evaluate the catheter tip based on the history of tachycardia. The catheter tip remains well positioned at the superior cavoatrial  junction and unchanged from the original placement images. IMPRESSION: Successful right IJ vein Port-A-Cath explant. Electronically Signed   By: Markus Daft M.D.   On: 12/28/2017 12:28    All questions were answered. The patient knows to call the clinic with any problems, questions or concerns. No barriers to learning was detected.  I spent 25 minutes counseling the patient face to face. The total time spent in the appointment was 30 minutes and more than 50% was on counseling and review of test results  Heath Lark, MD 01/04/2018 1:29 PM

## 2018-01-04 NOTE — Assessment & Plan Note (Signed)
She was seen by neurosurgeon at [facility] with negative MRI scan I will defer to them for future imaging study and follow-up In the meantime, I will continue to provide supportive care

## 2018-01-04 NOTE — Telephone Encounter (Signed)
Patient decline avs and calendar °

## 2018-01-04 NOTE — Assessment & Plan Note (Signed)
She has chronic mild pancytopenia Recent serum vitamin B12 was adequate I recommend multivitamin supplement

## 2018-01-04 NOTE — Assessment & Plan Note (Signed)
The patient  has poorly controlled seizures She will continue antiseizure medication through her neurologist I refilled her prescription lorazepam that she uses for anxiety and that could also help with seizure control.

## 2018-01-04 NOTE — Assessment & Plan Note (Signed)
She had chronic easy bruising Discontinue aspirin from her medication list Observe only

## 2018-01-04 NOTE — Assessment & Plan Note (Signed)
She has poorly controlled headaches She is taking hydrocodone and Fioricet as needed She is also taking gabapentin for neuropathic pain She will continue her current medications as prescribed

## 2018-01-10 ENCOUNTER — Other Ambulatory Visit: Payer: Self-pay | Admitting: Hematology and Oncology

## 2018-01-11 ENCOUNTER — Telehealth: Payer: Self-pay

## 2018-01-11 MED ORDER — BUTALBITAL-APAP-CAFFEINE 50-325-40 MG PO TABS: 1.0000 | ORAL_TABLET | Freq: Four times a day (QID) | ORAL | 0 refills | Status: DC | PRN

## 2018-01-11 MED FILL — BUTALB-ACETAMIN-CAFF 50-325: 50-325-40 | 22 days supply | Qty: 90 | Fill #0

## 2018-01-11 NOTE — Telephone Encounter (Signed)
Called and left message that Fioricet Rx is ready for pick up.

## 2018-01-16 ENCOUNTER — Other Ambulatory Visit: Payer: Self-pay | Admitting: Hematology and Oncology

## 2018-01-16 DIAGNOSIS — R110 Nausea: Secondary | ICD-10-CM

## 2018-01-16 DIAGNOSIS — C719 Malignant neoplasm of brain, unspecified: Secondary | ICD-10-CM

## 2018-01-17 ENCOUNTER — Other Ambulatory Visit: Payer: Self-pay | Admitting: Hematology

## 2018-01-17 ENCOUNTER — Telehealth: Payer: Self-pay

## 2018-01-17 DIAGNOSIS — R110 Nausea: Secondary | ICD-10-CM

## 2018-01-17 DIAGNOSIS — C719 Malignant neoplasm of brain, unspecified: Secondary | ICD-10-CM

## 2018-01-17 MED ORDER — HYDROCODONE-ACETAMINOPHEN 10-325 MG PO TABS: 1.0000 | ORAL_TABLET | ORAL | 0 refills | Status: DC | PRN

## 2018-01-17 MED ORDER — DRONABINOL 10 MG PO CAPS: 10.0000 mg | ORAL_CAPSULE | Freq: Two times a day (BID) | ORAL | 0 refills | Status: DC | PRN

## 2018-01-17 MED FILL — DRONABINOL 10 MG CAPS: 10 | 30 days supply | Qty: 60 | Fill #0

## 2018-01-17 MED FILL — HYDROCODON-APAP 10-325: 10-325 | 15 days supply | Qty: 90 | Fill #0

## 2018-01-17 NOTE — Telephone Encounter (Signed)
Called and told Rx ready for pick up. He verbalized understanding.

## 2018-01-24 ENCOUNTER — Encounter: Payer: Self-pay | Admitting: *Deleted

## 2018-01-25 ENCOUNTER — Telehealth: Payer: Self-pay | Admitting: *Deleted

## 2018-01-25 ENCOUNTER — Telehealth: Payer: Self-pay | Admitting: Neurology

## 2018-01-25 NOTE — Telephone Encounter (Signed)
Patients husband requesting call to speak with nurse about pts medication.

## 2018-01-25 NOTE — Telephone Encounter (Signed)
Kaleen Odea is not on pt's DPR.  I will not return call

## 2018-01-25 NOTE — Telephone Encounter (Signed)
Call received from patient's spouse Victoria Bates requesting return call from [employer].  [place] with further assessment "Victoria Bates lost consciousness, passed out/fell yesterday at evening while painting and again today about 12:55 pm.  She didn't remember anything.  Asked son to clean up the paint.  Slightly hit her head.  Had seizure at bedtime.  Several seizures today according to our 36 year old son who's with her now.  She sounds okay when I spoke with her.  We need to know what's going on and what to do.  She's never passed out."    Above information reported to Dr. Alvy Bimler.  symptoms Call transferred to Verbal order received and read back for Victoria Bates and Victoria Bates to contact neurologist, Dr. Delice Lesch.    Victoria Bates given these orders with instructions to call when we end this call.

## 2018-01-27 ENCOUNTER — Ambulatory Visit (INDEPENDENT_AMBULATORY_CARE_PROVIDER_SITE_OTHER): Payer: Medicare Other

## 2018-01-27 ENCOUNTER — Encounter (HOSPITAL_COMMUNITY): Payer: Self-pay | Admitting: *Deleted

## 2018-01-27 ENCOUNTER — Ambulatory Visit (HOSPITAL_COMMUNITY)
Admission: EM | Admit: 2018-01-27 | Discharge: 2018-01-27 | Disposition: A | Discharge status: Home / Self Care | Payer: Medicare Other | Attending: Internal Medicine | Admitting: Internal Medicine

## 2018-01-27 DIAGNOSIS — W450XXA Nail entering through skin, initial encounter: Secondary | ICD-10-CM | POA: Diagnosis not present

## 2018-01-27 DIAGNOSIS — Z23 Encounter for immunization: Secondary | ICD-10-CM | POA: Diagnosis not present

## 2018-01-27 DIAGNOSIS — S6992XA Unspecified injury of left wrist, hand and finger(s), initial encounter: Secondary | ICD-10-CM | POA: Diagnosis not present

## 2018-01-27 DIAGNOSIS — W294XXA Contact with nail gun, initial encounter: Secondary | ICD-10-CM

## 2018-01-27 DIAGNOSIS — S60032A Contusion of left middle finger without damage to nail, initial encounter: Secondary | ICD-10-CM | POA: Diagnosis not present

## 2018-01-27 DIAGNOSIS — S61233A Puncture wound without foreign body of left middle finger without damage to nail, initial encounter: Secondary | ICD-10-CM

## 2018-01-27 HISTORY — DX: Neoplasm of unspecified behavior of endocrine glands and other parts of nervous system: D49.7

## 2018-01-27 MED ORDER — CEPHALEXIN 500 MG PO CAPS: 500.0000 mg | ORAL_CAPSULE | Freq: Four times a day (QID) | ORAL | 0 refills | Status: DC

## 2018-01-27 MED ORDER — TETANUS-DIPHTH-ACELL PERTUSSIS 5-2.5-18.5 LF-MCG/0.5 IM SUSP
0.5000 mL | Freq: Once | INTRAMUSCULAR | Status: AC
  Administered 2018-01-27: 0.5 mL via INTRAMUSCULAR

## 2018-01-27 MED ORDER — IBUPROFEN 600 MG PO TABS: 600.0000 mg | ORAL_TABLET | Freq: Four times a day (QID) | ORAL | 0 refills | Status: DC | PRN

## 2018-01-27 MED ORDER — OXYCODONE-ACETAMINOPHEN 5-325 MG PO TABS: 1.0000 | ORAL_TABLET | Freq: Every day | ORAL | 0 refills | Status: DC

## 2018-01-27 MED ORDER — TETANUS-DIPHTH-ACELL PERTUSSIS 5-2.5-18.5 LF-MCG/0.5 IM SUSP: 0.5000 mL | Freq: Once | INTRAMUSCULAR | Status: DC

## 2018-01-27 MED ORDER — CEPHALEXIN 500 MG PO CAPS: 500.0000 mg | ORAL_CAPSULE | Freq: Four times a day (QID) | ORAL | 0 refills | Status: AC

## 2018-01-27 MED ORDER — TETANUS-DIPHTH-ACELL PERTUSSIS 5-2.5-18.5 LF-MCG/0.5 IM SUSP
INTRAMUSCULAR | Status: AC
  Filled 2018-01-27: qty 0.5

## 2018-01-27 NOTE — ED Triage Notes (Signed)
Reports shooting a nail through left middle finger yesterday; states nail was difficult to remove.  C/O swelling and pain; reports oozing drainage.

## 2018-01-27 NOTE — Discharge Instructions (Signed)
Use anti-inflammatories for pain/swelling. You may take up to 800 mg Ibuprofen every 8 hours with food. You may supplement Ibuprofen with Tylenol [phone] mg every 8 hours.  You may use stronger pain medicine for nighttime pain to help you sleep, please only use for severe pain, DO NOT MIX with your other pain medicine.  Begin Keflex for the next week We updated your tetanus today Please apply ice, wash twice daily with warm soapy water  Please continue to monitor for signs of infection including worsening redness, swelling, pain or drainage for to this area, please follow-up with symptoms worsening

## 2018-01-27 NOTE — ED Provider Notes (Signed)
[facility]    CSN: [id number] Arrival date & time: 01/27/18  1628     History   Chief Complaint Chief Complaint  Patient presents with  . Finger Injury    HPI Victoria Bates is a 37 y.o. female history of seizures, metastatic brain cancer, presenting today for evaluation of left middle finger injury.  Patient was working on remodeling her home, was using a nail gun and the nail went through her left middle finger.  States that it went through the nail itself, through to the other side.  Was removed prior to arrival.  Incident happened yesterday.  Since she has had swelling and pain.  Notes drainage from nail.  Has been using Vicodin without relief.  HPI  Past Medical History:  Diagnosis Date  . Adrenal tumor   . Anemia   . Anxiety   . Asthma    very mild- no inhaler use since July 2013  . Brain tumor ([place])    Hx: of  . Cancer ([place]) 2003   skin  . Chronic fatigue 10/03/2014  . Cognitive impairment 08/13/2015  . Complication of anesthesia    takes longer for patient to go under anesthesia  . Dizziness   . Dysphasia 02/12/2014  . 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 Headache 02/12/2014  . Headache(784.0)    migraines  . Herniated disc   . Hot flashes 10/03/2014  . Infection    to right head incision  . 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 Liver hemangioma   . Liver lesion 08/22/2014  . Nausea alone 02/12/2014  . Neuropathic pain 04/08/2014  . Petit mal seizure status ([place])   . Seizures ([place])    petit mal serizures most days    Patient Active Problem List   Diagnosis Date Noted  . Polypharmacy 11/14/2017  . Easy bruising 11/14/2017  . Sinus infection 11/14/2017  . Pulmonary nodules 08/18/2017  . Vitamin D deficiency 07/19/2017  . Vitamin B12 deficiency 07/19/2017  . Cough, persistent 06/20/2017  . Orthostatic dizziness 01/24/2017  . Addison disease ([place]) 01/16/2017  . Anxiety 01/16/2017  . 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 Goals of care,  counseling/discussion 11/20/2016  . Pancytopenia, acquired ([place]) 10/10/2016  . Deficiency anemia 10/10/2016  . Upper airway cough syndrome 01/28/2016  . Cough variant asthma 01/28/2016  . Dyspnea 01/28/2016  . 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 Constipation, acute 10/12/2015  . Cognitive impairment 08/13/2015  . Dysphagia, idiopathic 04/30/2015  . Low serum cortisol level ([place]) 10/20/2014  . 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 Chronic fatigue 10/03/2014  . Hot flashes 10/03/2014  . Liver lesion 08/22/2014  . Pancytopenia due to antineoplastic chemotherapy ([place]) 06/23/2014  . Nausea & vomiting 06/23/2014  . Genetic testing 06/13/2014  . Intractable chronic cluster headache 04/28/2014  . Thrombocytopenia ([place]) 04/28/2014  . Neuropathic pain 04/08/2014  . Simple partial seizures ([place]) 03/20/2014  . Sensory seizure ([place]) 03/20/2014  . Nausea without vomiting 02/12/2014  . Dysphasia 02/12/2014  . Headache 02/12/2014  . Seizures ([place]) 02/12/2014  . Financial difficulty 02/12/2014  . CIN I (cervical intraepithelial neoplasia I) 02/12/2014  . Anemia, unspecified 02/12/2014  . 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 Paresthesias 09/20/2013  . Blurred vision, left eye  09/20/2013  . Asthma 07/24/2009  . CHEST PAIN 07/24/2009    Past Surgical History:  Procedure Laterality Date  . ABDOMINAL HYSTERECTOMY    . CESAREAN SECTION    . COLONOSCOPY WITH PROPOFOL N/A 08/29/2012   Procedure: COLONOSCOPY WITH PROPOFOL;  Surgeon: Arta Silence, MD;  Location: [facility];  Service: Endoscopy;  Laterality: N/A;  . coposcopy  08/06/2012   cervical biopsy  . CRANIOTOMY Right 09/26/2013    Procedure: Awake Right frontal open brain biopsy;  Surgeon: Winfield Cunas, MD;  Location: [facility];  Service: Neurosurgery;  Laterality: Right;  awake Right frontal open brain biopsy  . CRANIOTOMY N/A 10/30/2013   Procedure: debridement of cranial wound  right temporal incision, ;  Surgeon: Winfield Cunas, MD;  Location: [facility];  Service: Neurosurgery;  Laterality: N/A;  . CRANIOTOMY N/A 01/23/2014   Procedure: Awake Craniotomy with BrainLab;  Surgeon: Winfield Cunas, MD;  Location: [facility];  Service: Neurosurgery;  Laterality: N/A;  Awake Craniotomy  tumor with BrainLab  . ESOPHAGOGASTRODUODENOSCOPY (EGD) WITH PROPOFOL N/A 08/29/2012   Procedure: ESOPHAGOGASTRODUODENOSCOPY (EGD) WITH PROPOFOL;  Surgeon: Arta Silence, MD;  Location: [facility];  Service: Endoscopy;  Laterality: N/A;  . IR RADIOLOGY PERIPHERAL GUIDED IV START  12/28/2017  . IR REMOVAL TUN ACCESS W/ PORT W/O FL MOD SED  12/28/2017  . IR US GUIDE VASC ACCESS LEFT  12/28/2017  . left knee surgery  2000   torn meniscus  . right flank surgery  2005   bnign mass from flank area-right  . ROBOTIC ASSISTED TOTAL HYSTERECTOMY WITH BILATERAL SALPINGO OOPHERECTOMY Bilateral 09/25/2012   Procedure: ROBOTIC ASSISTED TOTAL HYSTERECTOMY WITH BILATERAL SALPINGO OOPHORECTOMY;  Surgeon: Maeola Sarah. Landry Mellow, MD;  Location: [facility];  Service: Gynecology;  Laterality: Bilateral;  . ROTATOR CUFF REPAIR    . WISDOM TOOTH EXTRACTION      OB History   None      Home Medications    Prior to Admission medications   Medication Sig Start Date End Date Taking? Authorizing Provider  carisoprodol (SOMA) 350 MG tablet TAKE 1 TABLET BY MOUTH 4 TIMES DAILY FOR SEIZURE PREVENTION 01/04/18  Yes Heath Lark, MD  Cholecalciferol (VITAMIN D) 2000 units CAPS Take 2,000 Units by mouth daily.   Yes [provider]  divalproex (DEPAKOTE ER) 250 MG 24 hr tablet Take 3 tabs twice a day Patient taking differently: Take 500-750 mg by mouth See admin  instructions. Takes 2 tabs in the morning, and 3 tabs in the evening 12/14/17  Yes Cameron Sprang, MD  gabapentin (NEURONTIN) 300 MG capsule Take 3 caps in AM, 4 caps in PM 12/14/17  Yes Cameron Sprang, MD  HYDROcodone-acetaminophen (NORCO) 10-325 MG tablet Take 1 tablet by mouth every 4 (four) hours as needed for moderate pain. 01/17/18  Yes Truitt Merle, MD  levocetirizine (XYZAL) 5 MG tablet Take 1 tablet (5 mg total) by mouth every evening. 04/20/17  Yes Gorsuch, Ni, MD  LORazepam (ATIVAN) 1 MG tablet Take 1 tablet (1 mg total) by mouth every 8 (eight) hours as needed for anxiety. 01/04/18  Yes Gorsuch, Ni, MD  montelukast (SINGULAIR) 10 MG tablet TAKE 1 TABLET BY MOUTH ONCE DAILY AT BEDTIME 11/13/17  Yes Gorsuch, Ni, MD  butalbital-acetaminophen-caffeine (FIORICET, ESGIC) 50-325-40 MG tablet Take 1 tablet by mouth every 6 (six) hours as needed for headache. 01/11/18   Heath Lark, MD  cephALEXin (KEFLEX) 500 MG capsule Take 1 capsule (500 mg total) by mouth 4 (  four) times daily for 7 days. 01/27/18 02/03/18  Deajah Erkkila C, PA-C  Cyanocobalamin (VITAMIN B-12 PO) Take 5,000 Units daily by mouth.     [provider]  dronabinol (MARINOL) 10 MG capsule Take 1 capsule (10 mg total) by mouth 2 (two) times daily as needed (To help weight gain). TAKE 1 CAPSULE BY MOUTH TWICE DAILY BEFORE MEALS 01/17/18   Truitt Merle, MD  ibuprofen (ADVIL,MOTRIN) 600 MG tablet Take 1 tablet (600 mg total) by mouth every 6 (six) hours as needed. 01/27/18   Eh Sesay C, PA-C  oxyCODONE-acetaminophen (PERCOCET/ROXICET) 5-325 MG tablet Take 1 tablet by mouth at bedtime. 01/27/18   Kentarius Partington C, PA-C  promethazine (PHENERGAN) 25 MG tablet Take 25 mg every 6 (six) hours as needed by mouth for nausea or vomiting.    [provider]    Family History Family History  Problem Relation Age of Onset  . Hypertension Mother   . Skin cancer Mother   . Thyroid cancer Father   . Skin cancer Father   . Breast cancer  Maternal Aunt        dx in her 70s  . Prostate cancer Maternal Uncle 63  . Melanoma Maternal Uncle   . Ovarian cancer Maternal Grandmother        dx in her late 7s-40s  . Breast cancer Maternal Aunt        bilateral breast cancer dx in 46s and 58s; possibly BRCA+  . Other Son        multiple CAL spots, bright spots on brain MRI, ? chiari malformation    Social History Social History   Tobacco Use  . Smoking status: Never Smoker  . Smokeless tobacco: Never Used  Substance Use Topics  . Alcohol use: No    Alcohol/week: 0.0 oz  . Drug use: No     Allergies   Adhesive [tape]; Latex; Percocet [oxycodone-acetaminophen]; Shellfish allergy; Skin adhesives; Soma [carisoprodol]; Tramadol hcl; Vancomycin; and Zofran [ondansetron hcl]   Review of Systems Review of Systems  Constitutional: Negative for activity change, appetite change, fatigue and fever.  Respiratory: Negative for shortness of breath.   Cardiovascular: Negative for chest pain.  Gastrointestinal: Negative for abdominal pain, nausea and vomiting.  Musculoskeletal: Positive for arthralgias and myalgias.  Skin: Positive for wound.  Neurological: Negative for dizziness, weakness, light-headedness, numbness and headaches.     Physical Exam Triage Vital Signs ED Triage Vitals  Enc Vitals Group     BP 01/27/18 1648 (!) 152/102     Pulse Rate 01/27/18 1647 94     Resp 01/27/18 1647 16     Temp 01/27/18 1647 (!) 97.3 F (36.3 C)     Temp Source 01/27/18 1647 Oral     SpO2 01/27/18 1647 100 %     Weight --      Height --      Head Circumference --      Peak Flow --      Pain Score --      Pain Loc --      Pain Edu? --      Excl. in [place]? --    No data found.  Updated Vital Signs BP (!) 152/102   Pulse 94   Temp (!) 97.3 F (36.3 C) (Oral)   Resp 16   LMP 09/20/2012   SpO2 100%   Visual Acuity Right Eye Distance:   Left Eye Distance:   Bilateral Distance:    Right Eye Near:  Left Eye Near:      Bilateral Near:     Physical Exam  Constitutional: She is oriented to person, place, and time. She appears well-developed and well-nourished.  No acute distress  HENT:  Head: Normocephalic and atraumatic.  Nose: Nose normal.  Eyes: Conjunctivae are normal.  Neck: Neck supple.  Cardiovascular: Normal rate.  Pulmonary/Chest: Effort normal. No respiratory distress.  Abdominal: She exhibits no distension.  Musculoskeletal: Normal range of motion.  Neurological: She is alert and oriented to person, place, and time.  Skin: Skin is warm and dry.  Swelling and bruising to distal aspect of left middle finger palmar side, small exit wound to distal pulp, no obvious bruising, redness or swelling around nail bed  Psychiatric: She has a normal mood and affect.  Nursing note and vitals reviewed.    UC Treatments / Results  Labs (all labs ordered are listed, but only abnormal results are displayed) Labs Reviewed - No data to display  EKG None  Radiology Dg Finger Middle Left  Result Date: 01/27/2018 CLINICAL DATA:  Patient states that a nail from nail gun penetrated her left middle finger yesterday, pain and bruising along distal end of left middle finger. No Hx EXAM: LEFT MIDDLE FINGER 2+V COMPARISON:  04/19/2010 FINDINGS: There is no evidence of fracture or dislocation. There is no evidence of arthropathy or other focal bone abnormality. Soft tissues are unremarkable. IMPRESSION: Negative. Electronically Signed   By: Nolon Nations M.D.   On: 01/27/2018 17:34    Procedures Procedures (including critical care time)  Medications Ordered in UC Medications  Tdap (BOOSTRIX) injection 0.5 mL (0.5 mLs Intramuscular Given 01/27/18 1740)    Initial Impression / Assessment and Plan / UC Course  I have reviewed the triage vital signs and the nursing notes.  Pertinent labs & imaging results that were available during my care of the patient were reviewed by me and considered in my medical  decision making (see chart for details).     No fracture seen on x-ray, patient immunocompetent optimized, will go ahead and initiate treatment with Keflex to prevent infection, tetanus updated.  Discussed using anti-inflammatories for pain and swelling.  Patient receives Vicodin from her oncologist, requesting further medication. Provided 6 tabs of oxycodone, to use only at bedtime and for severe pain. Advised not to mix with other pain medicine. Patient and Husband verbalized understanding and agreeable. Discussed strict return precautions. Patient verbalized understanding and is agreeable with plan.  Final Clinical Impressions(s) / UC Diagnoses   Final diagnoses:  Injury of finger of left hand, initial encounter     Discharge Instructions     Use anti-inflammatories for pain/swelling. You may take up to 800 mg Ibuprofen every 8 hours with food. You may supplement Ibuprofen with Tylenol [id number] mg every 8 hours.  You may use stronger pain medicine for nighttime pain to help you sleep, please only use for severe pain, DO NOT MIX with your other pain medicine.  Begin Keflex for the next week We updated your tetanus today Please apply ice, wash twice daily with warm soapy water  Please continue to monitor for signs of infection including worsening redness, swelling, pain or drainage for to this area, please follow-up with symptoms worsening   ED Prescriptions    Medication Sig Dispense Auth. Provider   oxyCODONE-acetaminophen (PERCOCET/ROXICET) 5-325 MG tablet  (Status: Discontinued) Take 1 tablet by mouth at bedtime. 6 tablet Jaire Pinkham C, PA-C   cephALEXin (KEFLEX) 500 MG capsule  (Status:  Discontinued) Take 1 capsule (500 mg total) by mouth 4 (four) times daily for 7 days. 28 capsule Asaf Elmquist C, PA-C   ibuprofen (ADVIL,MOTRIN) 600 MG tablet  (Status: Discontinued) Take 1 tablet (600 mg total) by mouth every 6 (six) hours as needed. 30 tablet Zoriyah Scheidegger C, PA-C    cephALEXin (KEFLEX) 500 MG capsule Take 1 capsule (500 mg total) by mouth 4 (four) times daily for 7 days. 28 capsule Kamrin Sibley C, PA-C   ibuprofen (ADVIL,MOTRIN) 600 MG tablet Take 1 tablet (600 mg total) by mouth every 6 (six) hours as needed. 30 tablet Hadden Steig C, PA-C   oxyCODONE-acetaminophen (PERCOCET/ROXICET) 5-325 MG tablet Take 1 tablet by mouth at bedtime. 6 tablet Bettyjo Lundblad, [place] C, PA-C     Controlled Substance Prescriptions [place] Controlled Substance Registry consulted? Yes, I have consulted the [place] Controlled Substances Registry for this patient, and feel the risk/benefit ratio today is favorable for proceeding with this prescription for a controlled substance.  Last prescription in July from her oncologist.   Janith Lima, PA-C 01/27/18 1842

## 2018-01-29 ENCOUNTER — Telehealth: Payer: Self-pay | Admitting: Neurology

## 2018-01-29 DIAGNOSIS — G40109 Localization-related (focal) (partial) symptomatic epilepsy and epileptic syndromes with simple partial seizures, not intractable, without status epilepticus: Secondary | ICD-10-CM

## 2018-01-29 NOTE — Addendum Note (Signed)
Addended by: Lenny Pastel on: 01/29/2018 02:43 PM   Modules accepted: Orders

## 2018-01-29 NOTE — Telephone Encounter (Signed)
Spoke with pt relaying message below.  She states that she will decrease Depakote to 2 tabs in AM and 3 tabs in PM.  Looking at the EEG schedule it appears that there is a time for 9:30 and 11:30.  Did not schedule as I do not want to mess with the AMB schedule, but pt states that either or those times would work for her, if those are available.  Orders placed. Message forwarded to Laureen Ochs for scheduling.

## 2018-01-29 NOTE — Telephone Encounter (Signed)
Pls ask if she thinks these symptoms are more since the increase in Depakote, if so, go back on prior dose of Depakote. I would like to do another prolonged EEG on her so we can see what's going on with the seizures. Victoria Bates has an opening Monday next week, can she come in for a 72-hour EEG? Thanks

## 2018-01-29 NOTE — Telephone Encounter (Signed)
Patient would like to call Her to discuss some episodes she had last week. She had a Seizure episode as well as passing out for 10 to 15 min. She had went to Urgent care where she had hurt her finger and was put on Ibuprofen and an Antibiotic which then she went into a Seizure. Please Call Patient. Thanks

## 2018-01-29 NOTE — Telephone Encounter (Signed)
Pt gave verbal OK to speak with her husband, Kaleen Odea.

## 2018-01-29 NOTE — Telephone Encounter (Signed)
Spoke with pt. She states that her last 3 seizures (2 last week, 1 over the weekend) all resulted in LOC.  States that this is unusual for her.  Pt states that her stuttering and tremors are a little worse, however the LOC is the most concerning to her at this time. Please advise.

## 2018-01-30 ENCOUNTER — Other Ambulatory Visit: Payer: Self-pay

## 2018-01-30 ENCOUNTER — Other Ambulatory Visit: Payer: Self-pay | Admitting: Hematology and Oncology

## 2018-01-30 ENCOUNTER — Other Ambulatory Visit: Payer: Self-pay | Admitting: Hematology

## 2018-01-30 ENCOUNTER — Encounter: Payer: Self-pay | Admitting: Hematology and Oncology

## 2018-01-30 ENCOUNTER — Ambulatory Visit: Payer: Medicare Other | Admitting: Pulmonary Disease

## 2018-01-30 ENCOUNTER — Telehealth: Payer: Self-pay

## 2018-01-30 DIAGNOSIS — C719 Malignant neoplasm of brain, unspecified: Secondary | ICD-10-CM

## 2018-01-30 MED ORDER — LORAZEPAM 1 MG PO TABS: 1.0000 mg | ORAL_TABLET | Freq: Three times a day (TID) | ORAL | 0 refills | Status: DC | PRN

## 2018-01-30 MED ORDER — HYDROCODONE-ACETAMINOPHEN 10-325 MG PO TABS: 1.0000 | ORAL_TABLET | ORAL | 0 refills | Status: DC | PRN

## 2018-01-30 MED ORDER — ONDANSETRON HCL 8 MG PO TABS: 8.0000 mg | ORAL_TABLET | Freq: Three times a day (TID) | ORAL | 0 refills | Status: DC | PRN

## 2018-01-30 MED FILL — HYDROCODON-APAP 10-325: 10-325 | 15 days supply | Qty: 90 | Fill #0

## 2018-01-30 MED FILL — ONDANSETRON HCL 8 MG TABLET: 8 | 10 days supply | Qty: 30 | Fill #0

## 2018-01-30 NOTE — Telephone Encounter (Signed)
Called husband, [name] regarding above [employer]. Rx ready for pick up for Ativan and Vicodin. Per Dr. Alvy Bimler, she can take Hydrocodone and double up for 2 days only for pain control. The Keflex is the most gentle antibiotic and anything else would be worse. Zofran Rx requested from [employer], sent to pharmacy. Instructed to take prior to antibiotic. [name] verbalized understanding. Instructed to call for concerns.

## 2018-02-01 MED FILL — LORazepam 1 MG TABS: 1 | 30 days supply | Qty: 90 | Fill #0

## 2018-02-05 ENCOUNTER — Other Ambulatory Visit: Payer: Self-pay | Admitting: Hematology and Oncology

## 2018-02-05 ENCOUNTER — Ambulatory Visit (INDEPENDENT_AMBULATORY_CARE_PROVIDER_SITE_OTHER): Payer: Medicare Other | Admitting: Neurology

## 2018-02-05 DIAGNOSIS — G40109 Localization-related (focal) (partial) symptomatic epilepsy and epileptic syndromes with simple partial seizures, not intractable, without status epilepticus: Secondary | ICD-10-CM

## 2018-02-06 ENCOUNTER — Telehealth: Payer: Self-pay

## 2018-02-06 DIAGNOSIS — G40109 Localization-related (focal) (partial) symptomatic epilepsy and epileptic syndromes with simple partial seizures, not intractable, without status epilepticus: Secondary | ICD-10-CM | POA: Diagnosis not present

## 2018-02-06 MED ORDER — BUTALBITAL-APAP-CAFFEINE 50-325-40 MG PO TABS: 1.0000 | ORAL_TABLET | Freq: Four times a day (QID) | ORAL | 0 refills | Status: DC | PRN

## 2018-02-06 MED FILL — BUTALB-ACETAMIN-CAFF 50-325: 50-325-40 | 22 days supply | Qty: 90 | Fill #0

## 2018-02-06 NOTE — Telephone Encounter (Signed)
Called husband and Told him Rx for Fioricet is ready for pick up. He verbalized understanding.

## 2018-02-07 DIAGNOSIS — G40109 Localization-related (focal) (partial) symptomatic epilepsy and epileptic syndromes with simple partial seizures, not intractable, without status epilepticus: Secondary | ICD-10-CM | POA: Diagnosis not present

## 2018-02-14 ENCOUNTER — Other Ambulatory Visit: Payer: Self-pay | Admitting: Hematology and Oncology

## 2018-02-14 DIAGNOSIS — C719 Malignant neoplasm of brain, unspecified: Secondary | ICD-10-CM

## 2018-02-14 MED ORDER — HYDROCODONE-ACETAMINOPHEN 10-325 MG PO TABS: 1.0000 | ORAL_TABLET | ORAL | 0 refills | Status: DC | PRN

## 2018-02-14 MED FILL — HYDROCODON-APAP 10-325: 10-325 | 15 days supply | Qty: 90 | Fill #0

## 2018-02-14 NOTE — Procedures (Signed)
ELECTROENCEPHALOGRAM REPORT  Dates of Recording: 02/05/2018 11:51AM to 02/08/2018   Patient's Name: Victoria Bates MRN: [id number] Date of Birth: Jan 09, 1981  Referring Provider: Dr. Ellouise Newer  Procedure: 72-hour ambulatory EEG  History: This is a 37 year old woman with history of brain tumor s/p resection, with continued report of seizures that have increased in frequency, now with loss of consciousness x 3 recently. EEG for classification.  Medications:  DEPAKOTE ER 250 MG 24 hr tablet MARINOL 10 MG capsule  NEURONTIN 300 MG capsule  ATIVAN 1 MG tablet  ADDERALL XR 10 MG 24 hr capsule aspirin 325 MG EC tablet  FIORICET, ESGIC 50-325-40 MG tablet SOMA 350 MG tablet  VITAMIN D 2000 units CAPS  VITAMIN B-12 PO  5,000 Units NORCO 10-325 MG tablet  XYZAL 5 MG tablet  SINGULAIR 10 MG tablet  PHENERGAN 25 MG tablet   Technical Summary: This is a 72-hour multichannel digital EEG recording measured by the international 10-20 system with electrodes applied with paste and impedances below 5000 ohms performed as portable with EKG monitoring.  The digital EEG was referentially recorded, reformatted, and digitally filtered in a variety of bipolar and referential montages for optimal display.    DESCRIPTION OF RECORDING: During maximal wakefulness, the background activity consisted of a symmetric 10 Hz posterior dominant rhythm which was reactive to eye opening.  There is occasional focal theta slowing over the right hemisphere. Breach artifact, with faster frequencies and higher voltage activity is occasionally seen on the right hemisphere. There were no epileptiform discharges seen in wakefulness.  During the recording, the patient progresses through wakefulness, drowsiness, and Stage 2 sleep.  During drowsiness and sleep, voltage asymmetry from breach artifact is more prominent with faster frequencies and higher voltage activity on the right hemisphere. Again, there were no epileptiform  discharges seen.  Events: On 07/22 at 1530-1535 hours, she has chest pain, difficulty breathing and making words. Electrographically, there were no EEG changes seen. EKG lead showed HR 138 bpm.  On 07/22 around 2230 and 2300 hours, she has a hard time finding words, very bad headache, tremors, stuttering. Electrographically, there were no EEG changes seen. EKG lead showed HR between 96-120 bpm during this time period.  On 07/22 at 2339 hours, she feels very tired, forgetful. Electrographically, there were no EEG or EKG changes seen.  On 07/23 at 0910 hours, she has difficulty focusing, very sleepy, eye twitching. Electrographically, there were no EEG or EKG changes seen.  On 07/23 at 0937 hours, her son notes eye twitching, stuttering, she says she is losing her mind. Electrographically, there were no EEG or EKG changes seen.  On 07/23 at 1221 hours, she feels very tired, stuttering, vision/depth off, jerk in jaw, very sleepy. Electrographically, there were no EEG changes seen. EKG lead showed HR 108 bpm. She is noted to fall asleep while sitting up and falls back on the bed at 1241 hours.  On 07/23 between 1617 to 1630 hours she pushes the button several times and reports she was shaking and very lightheaded. EKG lead showed HR up to 144 bpm during this time period.  On 07/24 between 0715 to 0720 hours, she had a massive migraine. Electrographically, there were no EEG or EKG changes seen.  On 07/24 around 1100 hours, she felt tired with heart racing. Electrographically, there were no EEG changes seen. EKG lead around this time period showed HR up to 162 bpm.  On 07/24 between 1530 to 1540 hours, she got very dizzy  and lightheaded, head spinning. Electrographically, there were no EEG or EKG changes seen, HR 84 bpm.  On 07/24 at 1713 hours, she felt dizzy. Electrographically, there were no EEG changes seen. EKG lead showed HR 126 bpm.  On 07/24 between 2030 to 2100 hours, she felt very dizzy,  tired, restless, off balance. Electrographically, there were no EEG or EKG changes seen.  On 07/25 at 0147 hours, she feels extremely tired, restless, neck stiff, cannot stop simple/small movements. Electrographically, there were no EEG or EKG changes seen.  On 07/25 at 1045 hours, she feels very lightheaded and off balance. Electrographically, there were no EEG changes seen. EKG lead showed HR up to 150 bpm.  There were no electrographic seizures seen.  EKG lead showed several periods of sinus tachycardia up to 174 bpm.  IMPRESSION: This 72-hour ambulatory EEG study is mildly abnormal due to occasional focal slowing over the right hemisphere and breach artifact over the right hemisphere. There were multiple episodes of dizziness, word-finding difficulties, stuttering, headache, eye twitching, with no EEG correlate. One-lead EKG during several events showed sinus tachycardia. She had several periods of tachycardia up to 174 bpm during the duration of the study with no symptoms reported.  CLINICAL CORRELATION of the above findings indicates focal cerebral dysfunction over the right hemisphere suggestive of underlying structural or physiologic abnormality. Breach artifact is consistent with surgery in this region. There were no epileptiform discharges or electrographic seizures seen throughout the study. Episodes as noted above had no EEG correlate, note of elevated HR during episodes of dizziness. If further clinical questions remain, inpatient video EEG monitoring may be helpful.   Ellouise Newer, M.D.

## 2018-02-15 ENCOUNTER — Telehealth: Payer: Self-pay

## 2018-02-15 ENCOUNTER — Ambulatory Visit (INDEPENDENT_AMBULATORY_CARE_PROVIDER_SITE_OTHER)
Admission: RE | Admit: 2018-02-15 | Discharge: 2018-02-15 | Disposition: A | Discharge status: Home / Self Care | Payer: Medicare Other | Source: Ambulatory Visit | Attending: Pulmonary Disease | Admitting: Pulmonary Disease

## 2018-02-15 DIAGNOSIS — R911 Solitary pulmonary nodule: Secondary | ICD-10-CM | POA: Diagnosis not present

## 2018-02-15 DIAGNOSIS — R0602 Shortness of breath: Secondary | ICD-10-CM | POA: Diagnosis not present

## 2018-02-15 MED ORDER — IOPAMIDOL (ISOVUE-300) INJECTION 61%
75.0000 mL | Freq: Once | INTRAVENOUS | Status: AC | PRN
  Administered 2018-02-15: 75 mL via INTRAVENOUS

## 2018-02-15 NOTE — Telephone Encounter (Signed)
-----   Message from Annamaria Helling, [place] sent at 02/14/2018  4:09 PM EDT ----- Geanie Cooley- please call patient.

## 2018-02-15 NOTE — Telephone Encounter (Signed)
Call pt to relay message below.   No answer.  LMOM asking for return call.     Message below from Dr. Delice Lesch:   Pls let her/husband know the 72-hour EEG did not show any evidence of seizure activity throughout the study and during the times she had symptoms. Her heart rate did go up several times, which can cause dizziness. I have reached out to her cardiologist about maybe restarting the metoprolol, this may help reduce heart rate and potentially help with her symptoms. Pls remind her that she should not be driving since she passed out recently. Thanks

## 2018-02-21 ENCOUNTER — Telehealth: Payer: Self-pay

## 2018-02-21 NOTE — Telephone Encounter (Signed)
Attempted to contact patient, but there was no answer. Left message for patient to call back.  

## 2018-02-22 ENCOUNTER — Telehealth: Payer: Self-pay

## 2018-02-22 ENCOUNTER — Other Ambulatory Visit: Payer: Self-pay

## 2018-02-22 MED ORDER — BUTALBITAL-APAP-CAFFEINE 50-325-40 MG PO TABS: 1.0000 | ORAL_TABLET | Freq: Four times a day (QID) | ORAL | 0 refills | Status: DC | PRN

## 2018-02-22 MED ORDER — LORAZEPAM 1 MG PO TABS: 1.0000 mg | ORAL_TABLET | Freq: Three times a day (TID) | ORAL | 0 refills | Status: DC | PRN

## 2018-02-22 MED FILL — BUTALB-ACETAMIN-CAFF 50-325: 50-325-40 | 22 days supply | Qty: 90 | Fill #0

## 2018-02-22 NOTE — Telephone Encounter (Signed)
She called and left a message asking for refill on her Ativan and Fioricet. She is leaving and going on vacation tomorrow.

## 2018-02-22 NOTE — Telephone Encounter (Signed)
Called and told Rx ready for pick-up. 

## 2018-02-27 NOTE — Telephone Encounter (Signed)
Called patient again. Made patient aware Dr. Irish Lack received a message from Dr. Delice Lesch letting us know that she did a 72-hour EEG on her and that she reported dizziness several times during the study and her HR would range from 120-150 bpm when symptomatic and that she would have sinus tachycardia sometimes up to 174 bpm. I let the patient know that she wanted [place] to touch base with her regarding potentially restarting her metoprolol.   Patient states that she wants to hold off on restarting metoprolol for now and that she will let us know if she changes her mind.

## 2018-02-28 ENCOUNTER — Other Ambulatory Visit: Payer: Self-pay | Admitting: Hematology and Oncology

## 2018-02-28 ENCOUNTER — Encounter: Payer: Self-pay | Admitting: Pulmonary Disease

## 2018-02-28 ENCOUNTER — Ambulatory Visit (INDEPENDENT_AMBULATORY_CARE_PROVIDER_SITE_OTHER): Payer: Medicare Other | Admitting: Pulmonary Disease

## 2018-02-28 ENCOUNTER — Telehealth: Payer: Self-pay

## 2018-02-28 ENCOUNTER — Telehealth: Payer: Self-pay | Admitting: Interventional Cardiology

## 2018-02-28 DIAGNOSIS — R911 Solitary pulmonary nodule: Secondary | ICD-10-CM

## 2018-02-28 DIAGNOSIS — J4520 Mild intermittent asthma, uncomplicated: Secondary | ICD-10-CM | POA: Diagnosis not present

## 2018-02-28 DIAGNOSIS — R918 Other nonspecific abnormal finding of lung field: Secondary | ICD-10-CM | POA: Diagnosis not present

## 2018-02-28 DIAGNOSIS — C719 Malignant neoplasm of brain, unspecified: Secondary | ICD-10-CM

## 2018-02-28 MED ORDER — HYDROCODONE-ACETAMINOPHEN 10-325 MG PO TABS: 1.0000 | ORAL_TABLET | ORAL | 0 refills | Status: DC | PRN

## 2018-02-28 MED FILL — HYDROCODON-APAP 10-325: 10-325 | 15 days supply | Qty: 90 | Fill #0

## 2018-02-28 NOTE — Assessment & Plan Note (Signed)
History not convincing for true asthma. Continue Singulair, try to avoid albuterol due to sinus tachycardia

## 2018-02-28 NOTE — Progress Notes (Signed)
   Subjective:    Patient ID: Victoria Bates, female    DOB: 01-Apr-1981, 37 y.o.   MRN: [id number]  HPI   37 year old never smoker with glioma  for FU of lung nodules and dyspnea. 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 s/p chemotherapy.  Since then she has been diagnosed with a left adrenal nodule, adrenal insufficiency for which she sees Dr. Buddy Duty  and is maintained on replacement corticosteroids. She has a history of raynauds' syndrome , serology was obtained including RNP which was negative  She is being followed for a right lower lobe pulmonary nodule which on my review dates back to 08/2015, this is 8 mm groundglass .  We reviewed CT chest from 02/2018 today which shows stable nodule.  There is also evidence of right subclavian stenosis and collaterals, a Port-A-Cath was present and was removed  She has been evaluated in the past in our office for asthma and felt not to have this.   Albuterol makes her shaky.  She was also given Flovent which she did not take due to side effects.  She has been evaluated by cardiology and felt to have orthostatic hypotension, Holter for 48 hours was negative but she does have persistent sinus tachycardia She has unexplained pancytopenia and is followed by oncology  Significant tests/ events reviewed  Echo 01/2017-RVSP 32+  CT angiogram 04/2017 negative pulmonary embolism CT chest with contrast at [facility] lower lobe 8 mm nodule in the right upper lobe 4 mm nodule, left adrenal 1.3 cm nodule  Spirometry 06/2017-ratio 88, FEV1 68%, FVC 63%-moderate restriction PFTs 12/2015-ratio 84, FEV1 79%, FVC 78%, TLC 87%, DLCO 59% corrects to 77% for alveolar volume   Review of Systems Patient denies significant dyspnea,cough, hemoptysis,  chest pain, palpitations, pedal edema, orthopnea, paroxysmal nocturnal dyspnea, lightheadedness, nausea, vomiting, abdominal or  leg pains      Objective:   Physical Exam   Gen. Pleasant, well-nourished, in no distress ENT - no thrush, no post nasal drip Neck: No JVD, no thyromegaly, no carotid bruits Lungs: no use of accessory muscles, no dullness to percussion, clear without rales or rhonchi  Cardiovascular: Rhythm regular, heart sounds  normal, no murmurs or gallops, no peripheral edema Musculoskeletal: No deformities, no cyanosis or clubbing         Assessment & Plan:

## 2018-02-28 NOTE — Patient Instructions (Signed)
CT chest -no contrast in 1 year

## 2018-02-28 NOTE — Telephone Encounter (Signed)
New Message:       Pt is stating she would like [place] to check for her CT Scan results and let her know what she needs to do next. She states it is not urgent that she get a call back today.

## 2018-02-28 NOTE — Assessment & Plan Note (Signed)
On my personal review of her imaging, there is groundglass nodule in the right lower lobe seems to date back to 2017 and has been stable. I will arrange for a 18-month follow-up scan to ensure stability-

## 2018-02-28 NOTE — Telephone Encounter (Signed)
Called and told Rx for Norco ready for pick-up. He verbalized understanding.

## 2018-03-01 MED FILL — LORazepam 1 MG TABS: 1 | 30 days supply | Qty: 90 | Fill #0

## 2018-03-01 NOTE — Telephone Encounter (Signed)
Left message for patient to call back  

## 2018-03-12 ENCOUNTER — Encounter: Payer: Self-pay | Admitting: Hematology and Oncology

## 2018-03-12 ENCOUNTER — Other Ambulatory Visit: Payer: Self-pay | Admitting: Hematology and Oncology

## 2018-03-13 ENCOUNTER — Other Ambulatory Visit: Payer: Self-pay | Admitting: Hematology and Oncology

## 2018-03-13 ENCOUNTER — Telehealth: Payer: Self-pay

## 2018-03-13 DIAGNOSIS — C719 Malignant neoplasm of brain, unspecified: Secondary | ICD-10-CM

## 2018-03-13 MED ORDER — BUTALBITAL-APAP-CAFFEINE 50-325-40 MG PO TABS: 1.0000 | ORAL_TABLET | Freq: Four times a day (QID) | ORAL | 0 refills | Status: DC | PRN

## 2018-03-13 MED ORDER — HYDROCODONE-ACETAMINOPHEN 10-325 MG PO TABS: 1.0000 | ORAL_TABLET | ORAL | 0 refills | Status: DC | PRN

## 2018-03-13 MED FILL — HYDROCODON-APAP 10-325: 10-325 | 15 days supply | Qty: 90 | Fill #0

## 2018-03-13 MED FILL — BUTALB-ACETAMIN-CAFF 50-325: 50-325-40 | 22 days supply | Qty: 90 | Fill #0

## 2018-03-13 NOTE — Telephone Encounter (Signed)
Called and told Rx's ready for pick up. Ask him to call and request Rx refills. He verbalized understanding.

## 2018-03-14 ENCOUNTER — Telehealth: Payer: Self-pay

## 2018-03-14 NOTE — Telephone Encounter (Signed)
Victoria Bates, per Dr. Alvy Bimler regarding Ativan and Xanax Rx. She states that she is only taking the Ativan. She stopped the Xanax after appt with Dr. Alvy Bimler on 4/29 when she was instructed to take only the Ativan. She did fill the Xanax Rx on 4/7. She states that she did not fill the Xanax Rx on 8/1, she will call [facility] to clarify.

## 2018-03-15 ENCOUNTER — Telehealth: Payer: Self-pay

## 2018-03-15 NOTE — Telephone Encounter (Signed)
She called and left a message to call her back regarding conversation yesterday. She called [facility] regarding [facility] for [facility]ax. She does not have anymore refills for [facility]ax at [facility]. She did not sign for the Xanax pick up on 8/1. She looked at the signatures on the pickup and it not anyone in her family. [facility] is aware and looking in to the above. [place] said she is only taking the Ativan.

## 2018-03-23 ENCOUNTER — Other Ambulatory Visit: Payer: Self-pay | Admitting: Allergy and Immunology

## 2018-03-23 MED FILL — MONTELUKAST SOD 10 MG TAB: 10 | 90 days supply | Qty: 90 | Fill #1

## 2018-03-23 MED FILL — GABAPENTIN 300 MG CAPSULE: 300 | 90 days supply | Qty: 630 | Fill #1

## 2018-03-23 MED FILL — EPINEPHRINE 0.3 MG AUTO-INJ: 0.3 | 30 days supply | Qty: 2 | Fill #0

## 2018-03-23 MED FILL — LEVOCETIRIZINE 5 MG TABLET: 5 | 60 days supply | Qty: 60 | Fill #4

## 2018-03-23 MED FILL — DIVALPROEX SOD ER 250 MG TA: 250 | 90 days supply | Qty: 540 | Fill #0

## 2018-03-26 ENCOUNTER — Other Ambulatory Visit: Payer: Self-pay

## 2018-03-26 ENCOUNTER — Emergency Department (HOSPITAL_COMMUNITY): Payer: Medicare Other

## 2018-03-26 ENCOUNTER — Other Ambulatory Visit: Payer: Self-pay | Admitting: Hematology and Oncology

## 2018-03-26 ENCOUNTER — Encounter (HOSPITAL_COMMUNITY): Payer: Self-pay | Admitting: *Deleted

## 2018-03-26 ENCOUNTER — Telehealth: Payer: Self-pay | Admitting: *Deleted

## 2018-03-26 ENCOUNTER — Emergency Department (HOSPITAL_COMMUNITY)
Admission: EM | Admit: 2018-03-26 | Discharge: 2018-03-27 | Disposition: A | Discharge status: Home / Self Care | Payer: Medicare Other | Attending: Emergency Medicine | Admitting: Emergency Medicine

## 2018-03-26 DIAGNOSIS — S79921A Unspecified injury of right thigh, initial encounter: Secondary | ICD-10-CM | POA: Diagnosis not present

## 2018-03-26 DIAGNOSIS — Y929 Unspecified place or not applicable: Secondary | ICD-10-CM | POA: Insufficient documentation

## 2018-03-26 DIAGNOSIS — M79651 Pain in right thigh: Secondary | ICD-10-CM | POA: Diagnosis not present

## 2018-03-26 DIAGNOSIS — R600 Localized edema: Secondary | ICD-10-CM | POA: Diagnosis not present

## 2018-03-26 DIAGNOSIS — Y939 Activity, unspecified: Secondary | ICD-10-CM | POA: Insufficient documentation

## 2018-03-26 DIAGNOSIS — Y999 Unspecified external cause status: Secondary | ICD-10-CM | POA: Insufficient documentation

## 2018-03-26 DIAGNOSIS — Z79899 Other long term (current) drug therapy: Secondary | ICD-10-CM | POA: Diagnosis not present

## 2018-03-26 DIAGNOSIS — S7011XA Contusion of right thigh, initial encounter: Secondary | ICD-10-CM | POA: Insufficient documentation

## 2018-03-26 DIAGNOSIS — C719 Malignant neoplasm of brain, unspecified: Secondary | ICD-10-CM

## 2018-03-26 DIAGNOSIS — W19XXXA Unspecified fall, initial encounter: Secondary | ICD-10-CM | POA: Diagnosis not present

## 2018-03-26 DIAGNOSIS — Z91040 Latex allergy status: Secondary | ICD-10-CM | POA: Insufficient documentation

## 2018-03-26 DIAGNOSIS — M25551 Pain in right hip: Secondary | ICD-10-CM | POA: Diagnosis not present

## 2018-03-26 DIAGNOSIS — J45909 Unspecified asthma, uncomplicated: Secondary | ICD-10-CM | POA: Diagnosis not present

## 2018-03-26 MED ORDER — HYDROCODONE-ACETAMINOPHEN 10-325 MG PO TABS: 1.0000 | ORAL_TABLET | ORAL | 0 refills | Status: DC | PRN

## 2018-03-26 MED FILL — HYDROCODON-APAP 10-325: 10-325 | 15 days supply | Qty: 90 | Fill #0

## 2018-03-26 NOTE — ED Triage Notes (Signed)
Pt reports she fell on Saturday. She fell backwards because she could not catch herself, she c/o pain from the right posterior knee that wraps around to the front thigh and up through the hip. She also reports some bruising on the left hip. Did not take pain meds PTA. Pt HR up to the 140-150's initially, once repositioned in the chair in triage hr improved to 116.

## 2018-03-26 NOTE — Telephone Encounter (Signed)
Spoke with pt by phone.  She is agreeable to ED.

## 2018-03-26 NOTE — Telephone Encounter (Signed)
Spouse here for prescription pick up notified of patient need to go to ER.  [facility] [facility] [facility] ED notified of patient information per [name]'s request.

## 2018-03-26 NOTE — ED Provider Notes (Signed)
[facility] DEPT Provider Note   CSN: [id number] Arrival date & time: 03/26/18  1907     History   Chief Complaint Chief Complaint  Patient presents with  . Fall    HPI Victoria Bates is a 37 y.o. female.  Patient is a 37 year old female with past medical history of astrocytoma status post chemotherapy, anxiety, chronic fatigue, seizures.  She presents today for evaluation of severe pain in her right thigh.  She reports falling 2 nights ago from a standing position.  She does not recall the exact mechanism of her injury, however she is reporting pain that is debilitating.  She is having difficulty walking.  She denies any numbness or tingling.  She denies any chest pain or difficulty breathing.  The history is provided by the patient.  Fall  This is a new problem. The current episode started 2 days ago. The problem occurs constantly. The problem has not changed since onset.Nothing aggravates the symptoms. Nothing relieves the symptoms. She has tried nothing for the symptoms. The treatment provided no relief.    Past Medical History:  Diagnosis Date  . Adrenal tumor   . Anemia   . Anxiety   . Asthma    very mild- no inhaler use since July 2013  . Brain tumor ([place])    Hx: of  . Cancer ([place]) 2003   skin  . Chronic fatigue 10/03/2014  . Cognitive impairment 08/13/2015  . Complication of anesthesia    takes longer for patient to go under anesthesia  . Dizziness   . Dysphasia 02/12/2014  . Glioma, malignant ([place]) dx'd 09/2013  . Headache 02/12/2014  . Headache(784.0)    migraines  . Herniated disc   . Hot flashes 10/03/2014  . Infection    to right head incision  . 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 Liver hemangioma   . Liver lesion 08/22/2014  . Nausea alone 02/12/2014  . Neuropathic pain 04/08/2014  . Petit mal seizure status ([place])   . Seizures ([place])    petit mal serizures most days    Patient Active Problem List   Diagnosis Date Noted  .  Polypharmacy 11/14/2017  . Easy bruising 11/14/2017  . Sinus infection 11/14/2017  . Pulmonary nodules 08/18/2017  . Vitamin D deficiency 07/19/2017  . Vitamin B12 deficiency 07/19/2017  . Cough, persistent 06/20/2017  . Orthostatic dizziness 01/24/2017  . Addison disease ([place]) 01/16/2017  . Anxiety 01/16/2017  . 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 Goals of care, counseling/discussion 11/20/2016  . Pancytopenia, acquired ([place]) 10/10/2016  . Deficiency anemia 10/10/2016  . Upper airway cough syndrome 01/28/2016  . Cough variant asthma 01/28/2016  . Dyspnea 01/28/2016  . 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 Constipation, acute 10/12/2015  . Cognitive impairment 08/13/2015  . Dysphagia, idiopathic 04/30/2015  . Low serum cortisol level ([place]) 10/20/2014  . 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 Astrocytoma ([place]) 10/17/2014  . Chronic fatigue 10/03/2014  . Hot flashes 10/03/2014  . Liver lesion 08/22/2014  . Pancytopenia due to antineoplastic chemotherapy ([place]) 06/23/2014  . Nausea & vomiting 06/23/2014  . Genetic testing 06/13/2014  . Intractable chronic cluster headache 04/28/2014  . Thrombocytopenia ([place]) 04/28/2014  . Neuropathic pain 04/08/2014  . Simple partial seizures ([place]) 03/20/2014  . Sensory seizure ([place]) 03/20/2014  . Nausea without vomiting 02/12/2014  . Dysphasia 02/12/2014  . Headache 02/12/2014  .  Seizures ([place]) 02/12/2014  . Financial difficulty 02/12/2014  . CIN I (cervical intraepithelial neoplasia I) 02/12/2014  . Anemia, unspecified 02/12/2014  . Brain tumor, astrocytoma ([place]) 01/23/2014  . 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 Paresthesias 09/20/2013  . Blurred vision, left eye 09/20/2013  . Asthma 07/24/2009  . CHEST  PAIN 07/24/2009    Past Surgical History:  Procedure Laterality Date  . ABDOMINAL HYSTERECTOMY    . CESAREAN SECTION    . COLONOSCOPY WITH PROPOFOL N/A 08/29/2012   Procedure: COLONOSCOPY WITH PROPOFOL;  Surgeon: Arta Silence, MD;  Location: [facility];  Service: Endoscopy;  Laterality: N/A;  . coposcopy  08/06/2012   cervical biopsy  . CRANIOTOMY Right 09/26/2013   Procedure: Awake Right frontal open brain biopsy;  Surgeon: Winfield Cunas, MD;  Location: [facility];  Service: Neurosurgery;  Laterality: Right;  awake Right frontal open brain biopsy  . CRANIOTOMY N/A 10/30/2013   Procedure: debridement of cranial wound  right temporal incision, ;  Surgeon: Winfield Cunas, MD;  Location: [facility];  Service: Neurosurgery;  Laterality: N/A;  . CRANIOTOMY N/A 01/23/2014   Procedure: Awake Craniotomy with BrainLab;  Surgeon: Winfield Cunas, MD;  Location: [facility];  Service: Neurosurgery;  Laterality: N/A;  Awake Craniotomy  tumor with BrainLab  . ESOPHAGOGASTRODUODENOSCOPY (EGD) WITH PROPOFOL N/A 08/29/2012   Procedure: ESOPHAGOGASTRODUODENOSCOPY (EGD) WITH PROPOFOL;  Surgeon: Arta Silence, MD;  Location: [facility];  Service: Endoscopy;  Laterality: N/A;  . IR RADIOLOGY PERIPHERAL GUIDED IV START  12/28/2017  . IR REMOVAL TUN ACCESS W/ PORT W/O FL MOD SED  12/28/2017  . IR US GUIDE VASC ACCESS LEFT  12/28/2017  . left knee surgery  2000   torn meniscus  . right flank surgery  2005   bnign mass from flank area-right  . ROBOTIC ASSISTED TOTAL HYSTERECTOMY WITH BILATERAL SALPINGO OOPHERECTOMY Bilateral 09/25/2012   Procedure: ROBOTIC ASSISTED TOTAL HYSTERECTOMY WITH BILATERAL SALPINGO OOPHORECTOMY;  Surgeon: Maeola Sarah. Landry Mellow, MD;  Location: [facility];  Service: Gynecology;  Laterality: Bilateral;  . ROTATOR CUFF REPAIR    . WISDOM TOOTH EXTRACTION       OB History   None      Home Medications    Prior to Admission medications   Medication Sig Start Date End Date Taking? Authorizing  Provider  butalbital-acetaminophen-caffeine (FIORICET, ESGIC) 50-325-40 MG tablet Take 1 tablet by mouth every 6 (six) hours as needed for headache. 03/13/18   Heath Lark, MD  carisoprodol (SOMA) 350 MG tablet TAKE 1 TABLET BY MOUTH 4 TIMES DAILY FOR SEIZURE PREVENTION 01/04/18   Heath Lark, MD  Cholecalciferol (VITAMIN D) 2000 units CAPS Take 2,000 Units by mouth daily.    [provider]  Cyanocobalamin (VITAMIN B-12 PO) Take 5,000 Units daily by mouth.     [provider]  divalproex (DEPAKOTE ER) 250 MG 24 hr tablet Take 3 tabs twice a day Patient taking differently: Take 500-750 mg by mouth See admin instructions. Takes 2 tabs in the morning, and 3 tabs in the evening 12/14/17   Cameron Sprang, MD  dronabinol (MARINOL) 10 MG capsule Take 1 capsule (10 mg total) by mouth 2 (two) times daily as needed (To help weight gain). TAKE 1 CAPSULE BY MOUTH TWICE DAILY BEFORE MEALS 01/17/18   Truitt Merle, MD  EPINEPHRINE 0.3 mg/0.3 mL IJ SOAJ injection INJECT 0.3 MLS (ONE SYRINGE) INTO THE MUSCLE ONCE FOR  1 DOSE AS NEEDED. 03/23/18   Kozlow, Donnamarie Poag, MD  gabapentin (NEURONTIN) 300 MG capsule Take 3 caps in AM, 4 caps in PM 12/14/17   Cameron Sprang, MD  HYDROcodone-acetaminophen (NORCO) 10-325 MG tablet Take 1 tablet by mouth every 4 (four) hours as needed for moderate pain. 03/26/18   Heath Lark, MD  ibuprofen (ADVIL,MOTRIN) 600 MG tablet Take 1 tablet (600 mg total) by mouth every 6 (six) hours as needed. 01/27/18   Wieters, Hallie C, PA-C  levocetirizine (XYZAL) 5 MG tablet Take 1 tablet (5 mg total) by mouth every evening. 04/20/17   Heath Lark, MD  LORazepam (ATIVAN) 1 MG tablet Take 1 tablet (1 mg total) by mouth every 8 (eight) hours as needed for anxiety. 02/22/18   Heath Lark, MD  montelukast (SINGULAIR) 10 MG tablet TAKE 1 TABLET BY MOUTH ONCE DAILY AT BEDTIME 11/13/17   Heath Lark, MD  ondansetron (ZOFRAN) 8 MG tablet Take 1 tablet (8 mg total) by mouth every 8 (eight) hours as needed for  nausea or vomiting. 01/30/18   Heath Lark, MD  promethazine (PHENERGAN) 25 MG tablet Take 25 mg every 6 (six) hours as needed by mouth for nausea or vomiting.    [provider]    Family History Family History  Problem Relation Age of Onset  . Hypertension Mother   . Skin cancer Mother   . Thyroid cancer Father   . Skin cancer Father   . Breast cancer Maternal Aunt        dx in her 73s  . Prostate cancer Maternal Uncle 63  . Melanoma Maternal Uncle   . Ovarian cancer Maternal Grandmother        dx in her late 66s-40s  . Breast cancer Maternal Aunt        bilateral breast cancer dx in 55s and 51s; possibly BRCA+  . Other Son        multiple CAL spots, bright spots on brain MRI, ? chiari malformation    Social History Social History   Tobacco Use  . Smoking status: Never Smoker  . Smokeless tobacco: Never Used  Substance Use Topics  . Alcohol use: No    Alcohol/week: 0.0 standard drinks  . Drug use: No     Allergies   Adhesive [tape]; Latex; Percocet [oxycodone-acetaminophen]; Shellfish allergy; Skin adhesives; Soma [carisoprodol]; Tramadol hcl; Vancomycin; and Zofran [ondansetron hcl]   Review of Systems Review of Systems  All other systems reviewed and are negative.    Physical Exam Updated Vital Signs BP (!) 138/96   Pulse (!) 108   Temp 97.7 F (36.5 C) (Oral)   Resp 16   Ht '5\' 9"'  (1.753 m)   Wt 63.5 kg   LMP 09/20/2012   SpO2 98%   BMI 20.67 kg/m   Physical Exam  Constitutional: She is oriented to person, place, and time. She appears well-developed and well-nourished. No distress.  Patient is a 37 year old female who appears uncomfortable.  Neck: Normal range of motion. Neck supple.  Pulmonary/Chest: Effort normal.  Musculoskeletal:  There is exquisite tenderness to the quadriceps muscle of the right leg.  She reports severe pain with any range of motion.  This limits the exam.  DP pulses are easily palpable and motor and sensation are  intact throughout the entire foot.  Neurological: She is alert and oriented to person, place, and time.  Skin: Skin is warm and dry. She is not diaphoretic.  Nursing note and vitals reviewed.  ED Treatments / Results  Labs (all labs ordered are listed, but only abnormal results are displayed) Labs Reviewed - No data to display  EKG None  Radiology Dg Hip Unilat  With Pelvis 2-3 Views Right  Result Date: 03/26/2018 CLINICAL DATA:  Fall EXAM: DG HIP (WITH OR WITHOUT PELVIS) 2-3V RIGHT COMPARISON:  None. FINDINGS: There is no evidence of hip fracture or dislocation. There is no evidence of arthropathy or other focal bone abnormality. IMPRESSION: Negative. Electronically Signed   By: Rolm Baptise M.D.   On: 03/26/2018 20:51   Dg Femur Min 2 Views Right  Result Date: 03/26/2018 CLINICAL DATA:  Fall.  Right hip pain EXAM: RIGHT FEMUR 2 VIEWS COMPARISON:  None. FINDINGS: There is no evidence of fracture or other focal bone lesions. Soft tissues are unremarkable. IMPRESSION: Negative. Electronically Signed   By: Rolm Baptise M.D.   On: 03/26/2018 20:52    Procedures Procedures (including critical care time)  Medications Ordered in ED Medications - No data to display   Initial Impression / Assessment and Plan / ED Course  I have reviewed the triage vital signs and the nursing notes.  Pertinent labs & imaging results that were available during my care of the patient were reviewed by me and considered in my medical decision making (see chart for details).  Patient presents complaining of pain in her right thigh after a fall 2 days ago.  She reports her foot feeling cold.  The foot is somewhat cold to the touch, however DP pulses are palpable.  To rule out vessel injury, a CT Angie of the leg was obtained.  This was unremarkable with the exception of mild swelling to the anterior thigh.  It appears as though this injury is a contusion.  She will be discharged, to continue her home  medications as needed for pain.  Final Clinical Impressions(s) / ED Diagnoses   Final diagnoses:  None    ED Discharge Orders    None       Veryl Speak, MD 03/27/18 [phone]

## 2018-03-26 NOTE — ED Notes (Signed)
Dr. Delo at bedside. 

## 2018-03-26 NOTE — Telephone Encounter (Signed)
Victoria Bates [id number]) called "asking what Dr. Alvy Bimler recommends I do in referrence to a fall.  Orthopedic office reccomends walk-in clinic at 5:00 pm or ER because they can't see me.  Saturday night I stepped back, tripped, hit my rt hip.  Yesterday, a large purple bruise formed inside my right knee.  I didn't hit this area.  I felt a pop when I fell.  No seizure activity, I just tripped.  I hurt really bad, can't move well so I can't drive.  If she thinks I need to go to ER i'll figure something out."  Expecting return call with provider orders or instructions despite this nurse instructions to report to ER for right hip and right lower extremity evaluation.

## 2018-03-26 NOTE — Telephone Encounter (Signed)
See pharmacy message. 

## 2018-03-26 NOTE — Telephone Encounter (Signed)
I am concerned she might have broken her hip I suggest she goes to [facility] ER to be evaluated ASAP

## 2018-03-27 ENCOUNTER — Encounter (HOSPITAL_COMMUNITY): Payer: Self-pay

## 2018-03-27 ENCOUNTER — Emergency Department (HOSPITAL_COMMUNITY): Payer: Medicare Other

## 2018-03-27 DIAGNOSIS — S7011XA Contusion of right thigh, initial encounter: Secondary | ICD-10-CM | POA: Diagnosis not present

## 2018-03-27 DIAGNOSIS — R600 Localized edema: Secondary | ICD-10-CM | POA: Diagnosis not present

## 2018-03-27 LAB — BASIC METABOLIC PANEL
Anion gap: 9 (ref 5–15)
BUN: 11 mg/dL (ref 6–20)
CO2: 26 mmol/L (ref 22–32)
Calcium: 9.1 mg/dL (ref 8.9–10.3)
Chloride: 105 mmol/L (ref 98–111)
Creatinine, Ser: 0.55 mg/dL (ref 0.44–1.00)
GFR calc Af Amer: >60 mL/min (ref >60)
GFR calc non Af Amer: >60 mL/min (ref >60)
Glucose, Bld: 92 mg/dL (ref 70–99)
Potassium: 3.6 mmol/L (ref 3.5–5.1)
Sodium: 140 mmol/L (ref 135–145)

## 2018-03-27 LAB — CBC WITH DIFFERENTIAL/PLATELET
Basophils Absolute: 0 10*3/uL (ref 0.0–0.1)
Basophils Relative: 0 %
Eosinophils Absolute: 0 10*3/uL (ref 0.0–0.7)
Eosinophils Relative: 0 %
HCT: 35 % — ABNORMAL LOW (ref 36.0–46.0)
Hemoglobin: 12.1 g/dL (ref 12.0–15.0)
Lymphocytes Relative: 32 %
Lymphs Abs: 1.6 10*3/uL (ref 0.7–4.0)
MCH: 33.5 pg (ref 26.0–34.0)
MCHC: 34.6 g/dL (ref 30.0–36.0)
MCV: 97 fL (ref 78.0–100.0)
Monocytes Absolute: 0.4 10*3/uL (ref 0.1–1.0)
Monocytes Relative: 7 %
Neutro Abs: 2.9 10*3/uL (ref 1.7–7.7)
Neutrophils Relative %: 61 %
Platelets: 177 10*3/uL (ref 150–400)
RBC: 3.61 MIL/uL — ABNORMAL LOW (ref 3.87–5.11)
RDW: 11.3 % — ABNORMAL LOW (ref 11.5–15.5)
WBC: 4.9 10*3/uL (ref 4.0–10.5)

## 2018-03-27 MED ORDER — IOPAMIDOL (ISOVUE-370) INJECTION 76%
100.0000 mL | Freq: Once | INTRAVENOUS | Status: AC | PRN
  Administered 2018-03-27: 100 mL via INTRAVENOUS

## 2018-03-27 MED ORDER — MORPHINE SULFATE (PF) 4 MG/ML IV SOLN
4.0000 mg | Freq: Once | INTRAVENOUS | Status: DC
  Filled 2018-03-27: qty 1

## 2018-03-27 MED ORDER — IOPAMIDOL (ISOVUE-300) INJECTION 61%
INTRAVENOUS | Status: AC
  Filled 2018-03-27: qty 100

## 2018-03-27 MED ORDER — IOPAMIDOL (ISOVUE-370) INJECTION 76%
INTRAVENOUS | Status: AC
  Filled 2018-03-27: qty 100

## 2018-03-27 MED ORDER — IOPAMIDOL (ISOVUE-370) INJECTION 76%: 100.0000 mL | Freq: Once | INTRAVENOUS | Status: DC | PRN

## 2018-03-27 MED ORDER — IOPAMIDOL (ISOVUE-300) INJECTION 61%: 100.0000 mL | Freq: Once | INTRAVENOUS | Status: DC | PRN

## 2018-03-27 MED ORDER — HYDROCODONE-ACETAMINOPHEN 5-325 MG PO TABS
2.0000 | ORAL_TABLET | Freq: Once | ORAL | Status: AC
  Administered 2018-03-27: 2 via ORAL
  Filled 2018-03-27: qty 2

## 2018-03-27 NOTE — ED Notes (Signed)
Pt refused Knee immobilizer due to pain.

## 2018-03-27 NOTE — Discharge Instructions (Addendum)
Ibuprofen 600 mg every 6 hours as needed for pain.    Take your hydrocodone as prescribed as needed for pain not relieved with ibuprofen.  If your symptoms are not improving in the next week, follow-up with your primary doctor, and return to the ER if symptoms significantly worsen or change.

## 2018-04-02 ENCOUNTER — Other Ambulatory Visit: Payer: Self-pay | Admitting: Hematology and Oncology

## 2018-04-02 MED ORDER — BUTALBITAL-APAP-CAFFEINE 50-325-40 MG PO TABS: 1.0000 | ORAL_TABLET | Freq: Four times a day (QID) | ORAL | 0 refills | Status: DC | PRN

## 2018-04-02 MED ORDER — LORAZEPAM 1 MG PO TABS: 1.0000 mg | ORAL_TABLET | Freq: Three times a day (TID) | ORAL | 0 refills | Status: DC | PRN

## 2018-04-02 MED FILL — BUTALB-ACETAMIN-CAFF 50-325: 50-325-40 | 22 days supply | Qty: 90 | Fill #0

## 2018-04-02 MED FILL — LORazepam 1 MG TABS: 1 | 30 days supply | Qty: 90 | Fill #0

## 2018-04-04 DIAGNOSIS — M25551 Pain in right hip: Secondary | ICD-10-CM | POA: Diagnosis not present

## 2018-04-04 DIAGNOSIS — M25561 Pain in right knee: Secondary | ICD-10-CM | POA: Diagnosis not present

## 2018-04-09 ENCOUNTER — Other Ambulatory Visit: Payer: Self-pay | Admitting: Hematology and Oncology

## 2018-04-09 ENCOUNTER — Other Ambulatory Visit: Payer: Self-pay

## 2018-04-09 ENCOUNTER — Telehealth: Payer: Self-pay

## 2018-04-09 DIAGNOSIS — C719 Malignant neoplasm of brain, unspecified: Secondary | ICD-10-CM

## 2018-04-09 MED ORDER — HYDROCODONE-ACETAMINOPHEN 10-325 MG PO TABS: 1.0000 | ORAL_TABLET | ORAL | 0 refills | Status: DC | PRN

## 2018-04-09 MED FILL — HYDROCODON-APAP 10-325: 10-325 | 15 days supply | Qty: 90 | Fill #0

## 2018-04-09 NOTE — Telephone Encounter (Signed)
Paper prescription for hydrocodone shredded.  Prescription sent electronically per Dr Alvy Bimler. Pt aware

## 2018-04-10 ENCOUNTER — Other Ambulatory Visit: Payer: Medicare Other

## 2018-04-10 ENCOUNTER — Ambulatory Visit: Payer: Medicare Other | Admitting: Hematology and Oncology

## 2018-04-10 DIAGNOSIS — M25561 Pain in right knee: Secondary | ICD-10-CM | POA: Diagnosis not present

## 2018-04-10 DIAGNOSIS — M79651 Pain in right thigh: Secondary | ICD-10-CM | POA: Diagnosis not present

## 2018-04-16 DIAGNOSIS — M25561 Pain in right knee: Secondary | ICD-10-CM | POA: Diagnosis not present

## 2018-04-17 ENCOUNTER — Inpatient Hospital Stay (HOSPITAL_BASED_OUTPATIENT_CLINIC_OR_DEPARTMENT_OTHER): Payer: Medicare Other | Admitting: Hematology and Oncology

## 2018-04-17 ENCOUNTER — Telehealth: Payer: Self-pay | Admitting: Hematology and Oncology

## 2018-04-17 ENCOUNTER — Inpatient Hospital Stay: Payer: Medicare Other | Attending: Hematology and Oncology

## 2018-04-17 VITALS — BP 119/91 | HR 104 | Temp 97.3°F | Resp 18 | Ht 69.0 in | Wt 129.2 lb

## 2018-04-17 DIAGNOSIS — C711 Malignant neoplasm of frontal lobe: Secondary | ICD-10-CM

## 2018-04-17 DIAGNOSIS — G40109 Localization-related (focal) (partial) symptomatic epilepsy and epileptic syndromes with simple partial seizures, not intractable, without status epilepticus: Secondary | ICD-10-CM | POA: Insufficient documentation

## 2018-04-17 DIAGNOSIS — E2740 Unspecified adrenocortical insufficiency: Secondary | ICD-10-CM | POA: Diagnosis not present

## 2018-04-17 DIAGNOSIS — Z79899 Other long term (current) drug therapy: Secondary | ICD-10-CM | POA: Insufficient documentation

## 2018-04-17 DIAGNOSIS — C771 Secondary and unspecified malignant neoplasm of intrathoracic lymph nodes: Secondary | ICD-10-CM | POA: Diagnosis present

## 2018-04-17 DIAGNOSIS — G44021 Chronic cluster headache, intractable: Secondary | ICD-10-CM | POA: Insufficient documentation

## 2018-04-17 DIAGNOSIS — G8929 Other chronic pain: Secondary | ICD-10-CM | POA: Insufficient documentation

## 2018-04-17 DIAGNOSIS — R7989 Other specified abnormal findings of blood chemistry: Secondary | ICD-10-CM

## 2018-04-17 DIAGNOSIS — E559 Vitamin D deficiency, unspecified: Secondary | ICD-10-CM

## 2018-04-17 DIAGNOSIS — R52 Pain, unspecified: Secondary | ICD-10-CM

## 2018-04-17 LAB — CBC WITH DIFFERENTIAL/PLATELET
Basophils Absolute: 0 10*3/uL (ref 0.0–0.1)
Basophils Relative: 1 %
Eosinophils Absolute: 0 10*3/uL (ref 0.0–0.5)
Eosinophils Relative: 1 %
HCT: 36.4 % (ref 34.8–46.6)
Hemoglobin: 12.3 g/dL (ref 11.6–15.9)
Lymphocytes Relative: 27 %
Lymphs Abs: 0.7 10*3/uL — ABNORMAL LOW (ref 0.9–3.3)
MCH: 33.7 pg (ref 25.1–34.0)
MCHC: 33.7 g/dL (ref 31.5–36.0)
MCV: 100 fL (ref 79.5–101.0)
Monocytes Absolute: 0.3 10*3/uL (ref 0.1–0.9)
Monocytes Relative: 10 %
Neutro Abs: 1.7 10*3/uL (ref 1.5–6.5)
Neutrophils Relative %: 61 %
Platelets: 178 10*3/uL (ref 145–400)
RBC: 3.64 MIL/uL — ABNORMAL LOW (ref 3.70–5.45)
RDW: 11.5 % (ref 11.2–14.5)
WBC: 2.8 10*3/uL — ABNORMAL LOW (ref 3.9–10.3)

## 2018-04-17 MED ORDER — MORPHINE SULFATE 15 MG PO TABS: 15.0000 mg | ORAL_TABLET | Freq: Four times a day (QID) | ORAL | 0 refills | Status: DC | PRN

## 2018-04-17 MED ORDER — BUTALBITAL-APAP-CAFFEINE 50-325-40 MG PO TABS: 1.0000 | ORAL_TABLET | Freq: Four times a day (QID) | ORAL | 0 refills | Status: DC | PRN

## 2018-04-17 MED FILL — MORPHINE SULFATE IR 15 MG T: 15 | 20 days supply | Qty: 40 | Fill #0

## 2018-04-17 NOTE — Telephone Encounter (Signed)
Gave pt avs and calendar  °

## 2018-04-18 ENCOUNTER — Encounter: Payer: Self-pay | Admitting: Hematology and Oncology

## 2018-04-18 DIAGNOSIS — R52 Pain, unspecified: Secondary | ICD-10-CM | POA: Insufficient documentation
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
## 2018-04-18 NOTE — Assessment & Plan Note (Signed)
She takes chronic pain medicine and Fioricet for management of chronic headaches I refill her prescriptions today
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
## 2018-04-18 NOTE — Assessment & Plan Note (Signed)
She had history of low serum cortisol I recommend follow-up with her endocrinologist

## 2018-04-18 NOTE — Assessment & Plan Note (Signed)
It was brought to my attention that the patient might have polypharmacy She stated she is no longer taking Marinol She denies recent prescription refill of alprazolam She is only taking lorazepam as needed
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
## 2018-04-18 NOTE — Assessment & Plan Note (Signed)
She has chronic seizure disorders According to her husband, her seizures are better managed She is no longer taking Marinol The patient did not take regular lorazepam on a scheduled basis for seizures

## 2018-04-19 MED FILL — BUTALB-ACETAMIN-CAFF 50-325: 50-325-40 | 22 days supply | Qty: 90 | Fill #0

## 2018-04-19 NOTE — Progress Notes (Signed)
PA for morphine 15 mg has been submitted.

## 2018-04-23 ENCOUNTER — Other Ambulatory Visit: Payer: Self-pay | Admitting: Hematology and Oncology

## 2018-04-23 DIAGNOSIS — C719 Malignant neoplasm of brain, unspecified: Secondary | ICD-10-CM

## 2018-04-23 MED FILL — HYDROCODON-APAP 10-325: 10-325 | 15 days supply | Qty: 90 | Fill #0

## 2018-04-24 ENCOUNTER — Ambulatory Visit (HOSPITAL_COMMUNITY)
Admission: RE | Admit: 2018-04-24 | Discharge: 2018-04-24 | Disposition: A | Discharge status: Home / Self Care | Payer: Medicare Other | Source: Ambulatory Visit | Attending: Hematology and Oncology | Admitting: Hematology and Oncology

## 2018-04-24 DIAGNOSIS — S335XXA Sprain of ligaments of lumbar spine, initial encounter: Secondary | ICD-10-CM | POA: Diagnosis not present

## 2018-04-24 DIAGNOSIS — M546 Pain in thoracic spine: Secondary | ICD-10-CM | POA: Diagnosis not present

## 2018-04-24 DIAGNOSIS — S0990XA Unspecified injury of head, initial encounter: Secondary | ICD-10-CM | POA: Diagnosis not present

## 2018-04-24 DIAGNOSIS — C711 Malignant neoplasm of frontal lobe: Secondary | ICD-10-CM | POA: Diagnosis not present

## 2018-04-24 DIAGNOSIS — D496 Neoplasm of unspecified behavior of brain: Secondary | ICD-10-CM | POA: Diagnosis not present

## 2018-04-24 DIAGNOSIS — S139XXA Sprain of joints and ligaments of unspecified parts of neck, initial encounter: Secondary | ICD-10-CM | POA: Diagnosis not present

## 2018-04-24 MED ORDER — GADOBUTROL 1 MMOL/ML IV SOLN
6.0000 mL | Freq: Once | INTRAVENOUS | Status: AC | PRN
  Administered 2018-04-24: 6 mL via INTRAVENOUS

## 2018-04-25 ENCOUNTER — Telehealth: Payer: Self-pay

## 2018-04-25 NOTE — Telephone Encounter (Signed)
-----   Message from Heath Lark, MD sent at 04/24/2018  1:27 PM EDT ----- Regarding: MRI stable Call her and let her know MRI is stable ----- Message ----- From: Interface, Rad Results In Sent: 04/24/2018   1:22 PM EDT To: Heath Lark, MD

## 2018-04-25 NOTE — Telephone Encounter (Signed)
Called and left below message and to call the office for questions.

## 2018-04-30 DIAGNOSIS — F064 Anxiety disorder due to known physiological condition: Secondary | ICD-10-CM | POA: Diagnosis not present

## 2018-04-30 DIAGNOSIS — C711 Malignant neoplasm of frontal lobe: Secondary | ICD-10-CM | POA: Diagnosis not present

## 2018-05-01 DIAGNOSIS — M546 Pain in thoracic spine: Secondary | ICD-10-CM | POA: Diagnosis not present

## 2018-05-01 DIAGNOSIS — S134XXD Sprain of ligaments of cervical spine, subsequent encounter: Secondary | ICD-10-CM | POA: Diagnosis not present

## 2018-05-01 DIAGNOSIS — M542 Cervicalgia: Secondary | ICD-10-CM | POA: Diagnosis not present

## 2018-05-01 DIAGNOSIS — M791 Myalgia, unspecified site: Secondary | ICD-10-CM | POA: Diagnosis not present

## 2018-05-07 ENCOUNTER — Other Ambulatory Visit: Payer: Self-pay | Admitting: Hematology and Oncology

## 2018-05-07 ENCOUNTER — Telehealth: Payer: Self-pay

## 2018-05-07 DIAGNOSIS — C719 Malignant neoplasm of brain, unspecified: Secondary | ICD-10-CM

## 2018-05-07 MED FILL — BUTALB-ACETAMIN-CAFF 50-325: 50-325-40 | 23 days supply | Qty: 90 | Fill #0

## 2018-05-07 MED FILL — HYDROCODON-APAP 10-325: 10-325 | 15 days supply | Qty: 90 | Fill #0

## 2018-05-07 MED FILL — LORazepam 1 MG TABS: 1 | 30 days supply | Qty: 90 | Fill #0
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
## 2018-05-14 DIAGNOSIS — S161XXD Strain of muscle, fascia and tendon at neck level, subsequent encounter: Secondary | ICD-10-CM | POA: Diagnosis not present

## 2018-05-14 DIAGNOSIS — S29012D Strain of muscle and tendon of back wall of thorax, subsequent encounter: Secondary | ICD-10-CM | POA: Diagnosis not present

## 2018-05-17 DIAGNOSIS — M79604 Pain in right leg: Secondary | ICD-10-CM | POA: Diagnosis not present

## 2018-05-17 DIAGNOSIS — M6281 Muscle weakness (generalized): Secondary | ICD-10-CM | POA: Diagnosis not present

## 2018-05-17 DIAGNOSIS — S76111D Strain of right quadriceps muscle, fascia and tendon, subsequent encounter: Secondary | ICD-10-CM | POA: Diagnosis not present

## 2018-05-17 DIAGNOSIS — S76811D Strain of other specified muscles, fascia and tendons at thigh level, right thigh, subsequent encounter: Secondary | ICD-10-CM | POA: Diagnosis not present

## 2018-05-18 ENCOUNTER — Other Ambulatory Visit: Payer: Self-pay | Admitting: Allergy and Immunology

## 2018-05-18 ENCOUNTER — Other Ambulatory Visit: Payer: Self-pay | Admitting: Hematology and Oncology

## 2018-05-18 ENCOUNTER — Telehealth: Payer: Self-pay

## 2018-05-18 MED ORDER — BUTALBITAL-APAP-CAFFEINE 50-325-40 MG PO TABS: 1.0000 | ORAL_TABLET | Freq: Four times a day (QID) | ORAL | 0 refills | Status: DC | PRN

## 2018-05-18 MED ORDER — LORAZEPAM 1 MG PO TABS: 1.0000 mg | ORAL_TABLET | Freq: Three times a day (TID) | ORAL | 0 refills | Status: DC | PRN

## 2018-05-18 NOTE — Telephone Encounter (Signed)
Prescriptions for Ativan and Fioracet faxed to [facility]

## 2018-05-18 NOTE — Telephone Encounter (Signed)
New prescriptions faxed to pharmacy

## 2018-05-21 ENCOUNTER — Telehealth: Payer: Self-pay

## 2018-05-21 ENCOUNTER — Other Ambulatory Visit: Payer: Self-pay | Admitting: Hematology and Oncology

## 2018-05-21 DIAGNOSIS — S76111D Strain of right quadriceps muscle, fascia and tendon, subsequent encounter: Secondary | ICD-10-CM | POA: Diagnosis not present

## 2018-05-21 DIAGNOSIS — M6281 Muscle weakness (generalized): Secondary | ICD-10-CM | POA: Diagnosis not present

## 2018-05-21 DIAGNOSIS — M79604 Pain in right leg: Secondary | ICD-10-CM | POA: Diagnosis not present

## 2018-05-21 DIAGNOSIS — S161XXD Strain of muscle, fascia and tendon at neck level, subsequent encounter: Secondary | ICD-10-CM | POA: Diagnosis not present

## 2018-05-21 DIAGNOSIS — C719 Malignant neoplasm of brain, unspecified: Secondary | ICD-10-CM

## 2018-05-21 DIAGNOSIS — S29012D Strain of muscle and tendon of back wall of thorax, subsequent encounter: Secondary | ICD-10-CM | POA: Diagnosis not present

## 2018-05-21 DIAGNOSIS — S76811D Strain of other specified muscles, fascia and tendons at thigh level, right thigh, subsequent encounter: Secondary | ICD-10-CM | POA: Diagnosis not present

## 2018-05-21 MED ORDER — HYDROCODONE-ACETAMINOPHEN 10-325 MG PO TABS: 1.0000 | ORAL_TABLET | ORAL | 0 refills | Status: DC | PRN

## 2018-05-21 MED FILL — BUTALB-ACETAMIN-CAFF 50-325: 50-325-40 | 22 days supply | Qty: 90 | Fill #0

## 2018-05-21 MED FILL — HYDROCODON-APAP 10-325: 10-325 | 15 days supply | Qty: 90 | Fill #0

## 2018-05-21 NOTE — Telephone Encounter (Signed)
Called husband and told him Rx ready for pick up. He verbalized understanding.

## 2018-05-21 NOTE — Telephone Encounter (Signed)
Called husband back regarding Rx for [employer]. Victoria Bates has misplaced the Fioricet Rx and cannot find it.   Above message given to Dr. Jana Hakim. Okay to refill Fioricet early this one time. It will not be filled early again. Pharmacy notified to fill early.  Left above message for [facility].

## 2018-05-23 DIAGNOSIS — M6281 Muscle weakness (generalized): Secondary | ICD-10-CM | POA: Diagnosis not present

## 2018-05-23 DIAGNOSIS — S29012D Strain of muscle and tendon of back wall of thorax, subsequent encounter: Secondary | ICD-10-CM | POA: Diagnosis not present

## 2018-05-23 DIAGNOSIS — M79604 Pain in right leg: Secondary | ICD-10-CM | POA: Diagnosis not present

## 2018-05-23 DIAGNOSIS — S161XXD Strain of muscle, fascia and tendon at neck level, subsequent encounter: Secondary | ICD-10-CM | POA: Diagnosis not present

## 2018-05-23 DIAGNOSIS — S76811D Strain of other specified muscles, fascia and tendons at thigh level, right thigh, subsequent encounter: Secondary | ICD-10-CM | POA: Diagnosis not present

## 2018-05-23 DIAGNOSIS — S76111D Strain of right quadriceps muscle, fascia and tendon, subsequent encounter: Secondary | ICD-10-CM | POA: Diagnosis not present

## 2018-05-28 DIAGNOSIS — S76111D Strain of right quadriceps muscle, fascia and tendon, subsequent encounter: Secondary | ICD-10-CM | POA: Diagnosis not present

## 2018-05-28 DIAGNOSIS — M79604 Pain in right leg: Secondary | ICD-10-CM | POA: Diagnosis not present

## 2018-05-28 DIAGNOSIS — S76811D Strain of other specified muscles, fascia and tendons at thigh level, right thigh, subsequent encounter: Secondary | ICD-10-CM | POA: Diagnosis not present

## 2018-05-28 DIAGNOSIS — M6281 Muscle weakness (generalized): Secondary | ICD-10-CM | POA: Diagnosis not present

## 2018-05-28 DIAGNOSIS — S161XXD Strain of muscle, fascia and tendon at neck level, subsequent encounter: Secondary | ICD-10-CM | POA: Diagnosis not present

## 2018-05-28 DIAGNOSIS — S29012D Strain of muscle and tendon of back wall of thorax, subsequent encounter: Secondary | ICD-10-CM | POA: Diagnosis not present

## 2018-06-04 ENCOUNTER — Telehealth: Payer: Self-pay

## 2018-06-04 ENCOUNTER — Other Ambulatory Visit: Payer: Self-pay | Admitting: Hematology and Oncology

## 2018-06-04 DIAGNOSIS — S76111D Strain of right quadriceps muscle, fascia and tendon, subsequent encounter: Secondary | ICD-10-CM | POA: Diagnosis not present

## 2018-06-04 DIAGNOSIS — M79604 Pain in right leg: Secondary | ICD-10-CM | POA: Diagnosis not present

## 2018-06-04 DIAGNOSIS — C719 Malignant neoplasm of brain, unspecified: Secondary | ICD-10-CM

## 2018-06-04 DIAGNOSIS — S29012D Strain of muscle and tendon of back wall of thorax, subsequent encounter: Secondary | ICD-10-CM | POA: Diagnosis not present

## 2018-06-04 DIAGNOSIS — M6281 Muscle weakness (generalized): Secondary | ICD-10-CM | POA: Diagnosis not present

## 2018-06-04 DIAGNOSIS — S161XXD Strain of muscle, fascia and tendon at neck level, subsequent encounter: Secondary | ICD-10-CM | POA: Diagnosis not present

## 2018-06-04 DIAGNOSIS — S76811D Strain of other specified muscles, fascia and tendons at thigh level, right thigh, subsequent encounter: Secondary | ICD-10-CM | POA: Diagnosis not present

## 2018-06-04 MED ORDER — HYDROCODONE-ACETAMINOPHEN 10-325 MG PO TABS: 1.0000 | ORAL_TABLET | ORAL | 0 refills | Status: DC | PRN

## 2018-06-04 NOTE — Telephone Encounter (Signed)
She called and a message to call her.  Called back. She wanted to let the office know that she is filling Rx's at [facility] until after thanksgiving because [facility] has limited hours next week.

## 2018-06-11 ENCOUNTER — Other Ambulatory Visit: Payer: Self-pay | Admitting: Oncology

## 2018-06-11 DIAGNOSIS — M542 Cervicalgia: Secondary | ICD-10-CM | POA: Diagnosis not present

## 2018-06-11 DIAGNOSIS — M546 Pain in thoracic spine: Secondary | ICD-10-CM | POA: Diagnosis not present

## 2018-06-12 ENCOUNTER — Telehealth: Payer: Self-pay | Admitting: *Deleted

## 2018-06-12 ENCOUNTER — Other Ambulatory Visit: Payer: Self-pay | Admitting: Oncology

## 2018-06-12 MED FILL — LORazepam 1 MG TABS: 1 | 30 days supply | Qty: 90 | Fill #0

## 2018-06-12 MED FILL — BUTALB-ACETAMIN-CAFF 50-325: 50-325-40 | 22 days supply | Qty: 90 | Fill #0

## 2018-06-12 NOTE — Telephone Encounter (Signed)
Patient called for refill of the Lorazepam and Fioricet. She will need a Hydrocodone refill on Monday but can wait for Dr. Alvy Bimler to return per the patient.   Please send to [facility].   Message forwarded to Dr. Jana Hakim for review.

## 2018-06-18 ENCOUNTER — Telehealth: Payer: Self-pay | Admitting: *Deleted

## 2018-06-18 ENCOUNTER — Encounter: Payer: Self-pay | Admitting: Hematology and Oncology

## 2018-06-18 ENCOUNTER — Other Ambulatory Visit: Payer: Self-pay | Admitting: Oncology

## 2018-06-18 ENCOUNTER — Other Ambulatory Visit: Payer: Self-pay | Admitting: Hematology and Oncology

## 2018-06-18 DIAGNOSIS — C719 Malignant neoplasm of brain, unspecified: Secondary | ICD-10-CM

## 2018-06-18 DIAGNOSIS — T50905D Adverse effect of unspecified drugs, medicaments and biological substances, subsequent encounter: Secondary | ICD-10-CM

## 2018-06-18 MED ORDER — HYDROCODONE-ACETAMINOPHEN 10-325 MG PO TABS: 1.0000 | ORAL_TABLET | ORAL | 0 refills | Status: DC | PRN

## 2018-06-18 MED FILL — HYDROCODON-APAP 10-325: 10-325 | 15 days supply | Qty: 90 | Fill #0

## 2018-06-18 MED FILL — LEVOCETIRIZINE 5 MG TABLET: 5 | 60 days supply | Qty: 60 | Fill #0

## 2018-06-18 NOTE — Telephone Encounter (Signed)
LM to call us with what prescriptions are needed
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
## 2018-06-18 NOTE — Telephone Encounter (Signed)
Pt prefers to have the hydrocodone filled.

## 2018-06-18 NOTE — Telephone Encounter (Signed)
Victoria Bates left a message stating she sent a refill request through My Chart for hydrocodone. States she is using MS 15 mg for breakthrough pain on "bad days". The MS was prescribed on 10/1. Also states she had an MRI done by [facility] which shows 2 ruptured disc.

## 2018-06-26 MED FILL — GABAPENTIN 300 MG CAPSULE: 300 | 90 days supply | Qty: 630 | Fill #2

## 2018-06-28 DIAGNOSIS — M545 Low back pain: Secondary | ICD-10-CM | POA: Diagnosis not present

## 2018-06-28 MED FILL — CYCLOBENZAPRINE HCL 10 MG T: 10 | 10 days supply | Qty: 30 | Fill #0

## 2018-07-02 ENCOUNTER — Other Ambulatory Visit: Payer: Self-pay | Admitting: Hematology and Oncology

## 2018-07-02 ENCOUNTER — Telehealth: Payer: Self-pay

## 2018-07-02 ENCOUNTER — Other Ambulatory Visit: Payer: Self-pay | Admitting: Oncology

## 2018-07-02 ENCOUNTER — Encounter: Payer: Self-pay | Admitting: Hematology and Oncology

## 2018-07-02 DIAGNOSIS — C711 Malignant neoplasm of frontal lobe: Secondary | ICD-10-CM

## 2018-07-02 DIAGNOSIS — R519 Headache, unspecified: Secondary | ICD-10-CM

## 2018-07-02 DIAGNOSIS — C719 Malignant neoplasm of brain, unspecified: Secondary | ICD-10-CM

## 2018-07-02 DIAGNOSIS — R110 Nausea: Secondary | ICD-10-CM

## 2018-07-02 DIAGNOSIS — R51 Headache: Secondary | ICD-10-CM

## 2018-07-02 DIAGNOSIS — R0789 Other chest pain: Secondary | ICD-10-CM

## 2018-07-02 DIAGNOSIS — R4702 Dysphasia: Secondary | ICD-10-CM

## 2018-07-02 DIAGNOSIS — R202 Paresthesia of skin: Secondary | ICD-10-CM

## 2018-07-02 MED ORDER — BUTALBITAL-APAP-CAFFEINE 50-325-40 MG PO TABS: ORAL_TABLET | ORAL | 0 refills | Status: DC

## 2018-07-02 MED ORDER — CARISOPRODOL 350 MG PO TABS: ORAL_TABLET | ORAL | 1 refills | Status: DC

## 2018-07-02 MED ORDER — LORAZEPAM 1 MG PO TABS: 1.0000 mg | ORAL_TABLET | Freq: Three times a day (TID) | ORAL | 0 refills | Status: DC | PRN

## 2018-07-02 MED FILL — DIVALPROEX SOD ER 250 MG TA: 250 | 90 days supply | Qty: 540 | Fill #1

## 2018-07-02 MED FILL — HYDROCODON-APAP 10-325: 10-325 | 15 days supply | Qty: 90 | Fill #0

## 2018-07-02 MED FILL — BUTALB-ACETAMIN-CAFF 50-325: 50-325-40 | 23 days supply | Qty: 90 | Fill #0

## 2018-07-02 MED FILL — MONTELUKAST SOD 10 MG TAB: 10 | 90 days supply | Qty: 90 | Fill #2

## 2018-07-02 NOTE — Telephone Encounter (Signed)
done

## 2018-07-02 NOTE — Telephone Encounter (Signed)
Called regarding mychart message. Rx sent to pharmacy.

## 2018-07-04 ENCOUNTER — Other Ambulatory Visit: Payer: Self-pay | Admitting: Oncology

## 2018-07-04 DIAGNOSIS — M545 Low back pain: Secondary | ICD-10-CM | POA: Diagnosis not present

## 2018-07-10 MED FILL — LORazepam 1 MG TABS: 1 | 30 days supply | Qty: 90 | Fill #0

## 2018-07-12 DIAGNOSIS — M545 Low back pain: Secondary | ICD-10-CM | POA: Diagnosis not present

## 2018-07-12 DIAGNOSIS — M5416 Radiculopathy, lumbar region: Secondary | ICD-10-CM | POA: Diagnosis not present

## 2018-07-15 ENCOUNTER — Other Ambulatory Visit: Payer: Self-pay | Admitting: Hematology and Oncology

## 2018-07-15 ENCOUNTER — Other Ambulatory Visit: Payer: Self-pay | Admitting: Allergy and Immunology

## 2018-07-15 DIAGNOSIS — C719 Malignant neoplasm of brain, unspecified: Secondary | ICD-10-CM

## 2018-07-16 ENCOUNTER — Other Ambulatory Visit: Payer: Self-pay | Admitting: Hematology and Oncology

## 2018-07-16 DIAGNOSIS — C719 Malignant neoplasm of brain, unspecified: Secondary | ICD-10-CM

## 2018-07-16 MED FILL — CARISOPRODOL 350 MG TABS: 350 | 83 days supply | Qty: 332 | Fill #0

## 2018-07-16 MED FILL — CYCLOBENZAPRINE HCL 10 MG T: 10 | 10 days supply | Qty: 30 | Fill #1

## 2018-07-16 MED FILL — HYDROCODON-APAP 10-325: 10-325 | 15 days supply | Qty: 90 | Fill #0

## 2018-07-16 NOTE — Telephone Encounter (Signed)
Patient called and message to return call x2

## 2018-07-24 ENCOUNTER — Ambulatory Visit (INDEPENDENT_AMBULATORY_CARE_PROVIDER_SITE_OTHER): Payer: Medicare Other | Admitting: Neurology

## 2018-07-24 ENCOUNTER — Other Ambulatory Visit: Payer: Self-pay

## 2018-07-24 ENCOUNTER — Encounter: Payer: Self-pay | Admitting: Neurology

## 2018-07-24 VITALS — BP 110/58 | HR 93 | Ht 69.0 in | Wt 153.0 lb

## 2018-07-24 DIAGNOSIS — G40109 Localization-related (focal) (partial) symptomatic epilepsy and epileptic syndromes with simple partial seizures, not intractable, without status epilepticus: Secondary | ICD-10-CM | POA: Diagnosis not present

## 2018-07-24 MED ORDER — GABAPENTIN 300 MG PO CAPS: ORAL_CAPSULE | ORAL | 3 refills | Status: DC

## 2018-07-24 MED ORDER — DIVALPROEX SODIUM ER 250 MG PO TB24: ORAL_TABLET | ORAL | 3 refills | Status: DC
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
## 2018-07-24 NOTE — Progress Notes (Signed)
NEUROLOGY FOLLOW UP OFFICE NOTE  Victoria Bates [id number]  DOB: [birth year]  HISTORY OF PRESENT ILLNESS: I had the pleasure of seeing Victoria Bates in follow-up in the neurology clinic on 07/24/2018. She is alone in the office today. The patient was last seen 7 months ago for seizures. She has a history of a right frontal oligodendroglioma s/p subtotal resection. Most recent MRI in October 2019 was stable with encephalomalacia in the right frontal lobe status post subtotal removal of oligodendroglioma, gliosis in the deep white matter which could represent post treatment effect and/or nonenhancing tumor. On her last visit, she was reporting an increase in seizures. Depakote dose increased to 777m BID. She called our office in July 2019 to report 3 seizures, worsened stuttering and tremors, which she felt was due to Depakote increase. Dose reduced back to 5063min AM, 7502mn PM. She had a 72-hour EEG which did not show any epileptiform discharges, there was occasional right hemisphere focal slowing. She pushed the button multiple times for dizziness, word-finding difficulties, stuttering, headache, eye twitching, with no EEG correlate. EKG during several events showed sinus tachycardia up to 150 bpm. There were several periods of tachycardia up to 174 bpm where she did not report symptoms. She was instructed to speak to her Cardiologist, she has not been back to see him yet.   She reports that seizures had quieted down for the past few months after her port was taken out late June/early July, until she had 2 or 3 "good ones" after epidural injections on December 26. She has been having back and neck pain, the gabapentin also helps with this, she is taking gabapentin 300m44mtabs in AM, 3 tabs in PM, with no side effects. She reports a seizure last Saturday where she stumbled and her husband caught her and got her to bed, she was slurring her words, feeling heart palpitations and face jerking and took  her Ativan. She did not recall much of the evening. She had one yesterday that she does not recall much about, her son just told her that she really scared him but did not describe the event. She has been stressed out about her son's court date this morning. She notes doing really well during the holidays when she was under a lot of stress. She is taking hydrocodone more regularly, initially started for stomach pain, now also used for neck pain. She reports double vision in her left eye on bad days. She was reporting memory issues on last visit, she went for Neuropsychological testing in October, however it was noted that "the patient's level of affective and physical distress limited our ability to capture valid data. The patient and I had a lengthy discussion of all of the factors at play and agreed to repeat this examination in a couple months to allow for reduction in pain and pain medication as well as affective distress." She has not rescheduled this yet.   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 She was [facility] to [facility]/19/2013 due to left vision changes and left facial heaviness. She reports that she had a 60-lb unintentional weight loss 4 months leading up to her admission, she was vomiting after every meal. She started having episodes where her hands would have low amplitude tremors and she feels the right lower face twitching. She would need to pause and  take a minute to get words out. If she did not stop what she was doing, the symptoms could last for hours. She would feel like her body was "going 90 mph" with palpitations up to 200 bpm. She was started on Xanax for anxiety, but this did not help. She then started having vision changes on her left eye, she would park crookedly when she thought she was going straight. She saw an ophthalmologist, eye exam was  normal but left optic neuritis was questioned and brain imaging recommended. She then started to have left facial heaviness and numbness on the left face, arm, and leg, and drove herself to [facility] where she was found to have a right frontal lesion. She underwent subtotal resection on 01/23/14 by Dr. Christella Noa and was started on Keppra for seizure prophylaxis at that time. She continued to have the recurrent episodes of tremors in both arms, palpitations, and right facial tightness, as well as episodes of left face, neck, shoulder, and arm numbness, going down the left thumb and index finger. She describes her major seizures as "all of that times 100." After the episodes, she would be tired for a few days, wanting to sleep, with no energy. She has noticed that her short term memory is "gone." Keppra dose had been increased up to 2 grams daily, and this had caused a change in personality. She had seen neurologist Dr. Leonie Man, who switched her to Depakote. She tapered off Keppra and is currently on Depakote ER 523m BID with significant improvement in symptoms. She denies any side effects to the Depakote.   She has a history of recurrent headaches since her teenage years. Headaches are usually over the right temporal and occipital region, with sharp stabbing and throbbing pain, with a knot in her left neck region. There is occasional nausea, photo, and phonophobia. She was having these headaches around 1-2 times a week when younger, however since December 2014, headaches have been occurring around 2-3 times a week. She had been receiving injections in her neck for this. If she takes medication at the onset, they last a few hours, however they may last up to several days. She has found that taking Decadron for a couple of days has been the only helpful medication. She takes Fioricet rarely. Zomig, aspirin, and Tylenol were ineffective in the past. Her father and 161year old son have headaches. She denies any diplopia,  dysarthria, dysphagia, bowel/bladder dysfunction. She has chronic back pain and had been taking Soma qid for several years, with worsening of pain when she tried reducing this. She was started on Gabapentin for the seizures and back pain, she is unsure if this helps. She had been taking Ultram for the pain, and once stopping this, she noticed an improvement in the seizures as well.   Diagnostic Data[facility] which is likely from breach artifact from prior craniotomy. There is intermittent right frontal sharp activity but no definite epileptiform activity. Impression: Mild right hemispheric focal cortical irritability but no definitive epileptiform activity is noted.  Her 48-hour EEG did not show any epileptiform discharges. It showed breach artifact consistent with prior surgery. She reported numerous symptoms including headache, severe muscle spasm under the base of her skull on the right side, tremors in both hands, jerking and pause in jaw (right side), left leg/inner thigh and left foot warm and pigmented, eyes twitching, fatigue, with no associated epileptiform correlate seen. Inpatient video EEG  monitoring for 3 days at [facility], June 23 to 25, 2016: Depakote and Gabapentin were held. Typical events were not clearly captured, however she reported 2 spells where she reported dizziness and visual changes (tiles on floor looked uneven) with no EEG correlate. Baseline EEG showed breach rhythm over the right hemisphere, no epileptiform discharges or electrographic seizures.  I personally reviewed MRI brain with and without contrast done 03/13/14 which showed post-op right-sided craniotomy. The surgical cavity in the right frontal lobe is filled with fluid, with note of improvement in layering blood products since prior study. The study of 01/24/2014 showed restricted diffusion deep to the tumor. This is consistent with acute  infarct which is resolving at this time. No acute infarct is present on today's study. No evidence of recurrent or residual tumor although the original tumor did not enhance. No mass effect or edema is present that would suggest recurrent tumor. Small amount of subarachnoid hemorrhage on the right again noted. MRI 11/2015 which I personally reviewed, there is similar pattern of enhancement and abnormal FLAIR signal in the right frontal gyrus immediately superior to the resection, stable from previous scan.   Epilepsy Risk Factors: Right frontal oligodendroglioma s/p subtotal resection. Otherwise she had a normal birth and early development. There is no history of febrile convulsions, CNS infections such as meningitis/encephalitis, or family history of seizures.  PAST MEDICAL HISTORY: Past Medical History:  Diagnosis Date  . Adrenal tumor   . Anemia   . Anxiety   . Asthma    very mild- no inhaler use since July 2013  . Brain tumor ([place])    Hx: of  . Cancer ([place]) 2003   skin  . Chronic fatigue 10/03/2014  . Cognitive impairment 08/13/2015  . Complication of anesthesia    takes longer for patient to go under anesthesia  . Dizziness   . Dysphasia 02/12/2014  . Glioma, malignant ([place]) dx'd 09/2013  . Headache 02/12/2014  . Headache(784.0)    migraines  . Herniated disc   . Hot flashes 10/03/2014  . Infection    to right head incision  . 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 Liver hemangioma   . Liver lesion 08/22/2014  . Nausea alone 02/12/2014  . Neuropathic pain 04/08/2014  . Petit mal seizure status ([place])   . Seizures ([place])    petit mal serizures most days    MEDICATIONS:  Outpatient Encounter Medications as of 07/24/2018  Medication Sig  . butalbital-acetaminophen-caffeine (FIORICET, ESGIC) 50-325-40 MG tablet TAKE 1 TABLET BY MOUTH EVERY 6 HOURS AS NEEDED FOR HEADACHE  . carisoprodol (SOMA) 350 MG tablet TAKE 1 TABLET BY MOUTH 4 TIMES DAILY FOR SEIZURE PREVENTION  . Cyanocobalamin  (VITAMIN B-12 PO) Take 5,000 Units daily by mouth.   . divalproex (DEPAKOTE ER) 250 MG 24 hr tablet Take 3 tabs twice a day (Patient taking differently: Take 500-750 mg by mouth See admin instructions. Takes 2 tabs in the morning, and 3 tabs in the evening)  . EPINEPHRINE 0.3 mg/0.3 mL IJ SOAJ injection INJECT 0.3 MLS (ONE SYRINGE) INTO THE MUSCLE ONCE FOR 1 DOSE AS NEEDED. (Patient taking differently: Inject 0.3 mg into the muscle once. )  . gabapentin (NEURONTIN) 300 MG capsule Take 3 caps in AM, 4 caps in PM (Patient taking differently: Take 900-1,200 mg by mouth See admin instructions. Take 3 caps in AM, 4 caps in PM)  . HYDROcodone-acetaminophen (NORCO) 10-325 MG tablet TAKE 1 TABLET BY MOUTH EVERY 4 HOURS AS NEEDED FOR MODERATE PAIN  .  ibuprofen (ADVIL,MOTRIN) 600 MG tablet Take 1 tablet (600 mg total) by mouth every 6 (six) hours as needed. (Patient taking differently: Take 600 mg by mouth every 6 (six) hours as needed for headache, mild pain or moderate pain. )  . levocetirizine (XYZAL) 5 MG tablet TAKE 1 TABLET BY MOUTH EVERY EVENING.  . LORazepam (ATIVAN) 1 MG tablet Take 1 tablet (1 mg total) by mouth every 8 (eight) hours as needed for anxiety.  . montelukast (SINGULAIR) 10 MG tablet TAKE 1 TABLET BY MOUTH ONCE DAILY AT BEDTIME (Patient taking differently: Take 10 mg by mouth at bedtime. )  . ondansetron (ZOFRAN) 8 MG tablet Take 1 tablet (8 mg total) by mouth every 8 (eight) hours as needed for nausea or vomiting.  . promethazine (PHENERGAN) 25 MG tablet Take 25 mg every 6 (six) hours as needed by mouth for nausea or vomiting.  . Vitamin D, Ergocalciferol, (DRISDOL) 50000 units CAPS capsule Take 50,000 Units by mouth every 7 (seven) days.   No facility-administered encounter medications on file as of 07/24/2018.      ALLERGIES: Allergies  Allergen Reactions  . Adhesive [Tape] Other (See Comments)    Skin tear  . Latex Hives  . Percocet [Oxycodone-Acetaminophen] Other (See Comments)      makes pt angry  . Shellfish Allergy Diarrhea and Nausea And Vomiting  . Skin Adhesives     Per patient states reaction to dermabond when had hysterectomy. Stated was red and irritated.   Manuela Neptune [Carisoprodol]     Watson brand, specifically, causes disorientation (can take other brands)  . Tramadol Hcl Other (See Comments)    Potentially worsen seizure control  . Vancomycin Hives  . Zofran [Ondansetron Hcl]     Pt. Stated side effects : abdominal pain / constipation     FAMILY HISTORY: Family History  Problem Relation Age of Onset  . Hypertension Mother   . Skin cancer Mother   . Thyroid cancer Father   . Skin cancer Father   . Breast cancer Maternal Aunt        dx in her 2s  . Prostate cancer Maternal Uncle 63  . Melanoma Maternal Uncle   . Ovarian cancer Maternal Grandmother        dx in her late 22s-40s  . Breast cancer Maternal Aunt        bilateral breast cancer dx in 58s and 59s; possibly BRCA+  . Other Son        multiple CAL spots, bright spots on brain MRI, ? chiari malformation    SOCIAL HISTORY: Social History   Socioeconomic History  . Marital status: Significant Other    Spouse name: Not on file  . Number of children: 2  . Years of education: College  . Highest education level: Not on file  Occupational History  . Occupation: Engineer, mining: [employer]  Social Needs  . Financial resource strain: Not on file  . Food insecurity:    Worry: Not on file    Inability: Not on file  . Transportation needs:    Medical: Not on file    Non-medical: Not on file  Tobacco Use  . Smoking status: Never Smoker  . Smokeless tobacco: Never Used  Substance and Sexual Activity  . Alcohol use: No    Alcohol/week: 0.0 standard drinks  . Drug use: No  . Sexual activity: Not on file  Lifestyle  . Physical activity:    Days per week:  Not on file    Minutes per session: Not on file  . Stress: Not on file  Relationships  . Social connections:     Talks on phone: Not on file    Gets together: Not on file    Attends religious service: Not on file    Active member of club or organization: Not on file    Attends meetings of clubs or organizations: Not on file    Relationship status: Not on file  . Intimate partner violence:    Fear of current or ex partner: Not on file    Emotionally abused: Not on file    Physically abused: Not on file    Forced sexual activity: Not on file  Other Topics Concern  . Not on file  Social History Narrative   Patient lives at home with her two children.   Caffeine use: minimum of 4 cups daily    REVIEW OF SYSTEMS: Constitutional: No fevers, chills, or sweats, generalized fatigue, change in appetite Eyes: No visual changes, double vision, eye pain Ear, nose and throat: No hearing loss, ear pain, nasal congestion, sore throat Cardiovascular: No chest pain, palpitations Respiratory:  No shortness of breath at rest or with exertion, wheezes GastrointestinaI: No nausea, vomiting, diarrhea, abdominal pain, fecal incontinence Genitourinary:  No dysuria, urinary retention or frequency Musculoskeletal:  + neck pain, back pain Integumentary: No rash, pruritus, skin lesions Neurological: as above Psychiatric: No depression, insomnia, +anxiety Endocrine: No palpitations, fatigue, diaphoresis, mood swings, change in appetite, change in weight, increased thirst Hematologic/Lymphatic:  No anemia, purpura, petechiae. Allergic/Immunologic: no itchy/runny eyes, nasal congestion, recent allergic reactions, rashes  PHYSICAL EXAM: Vitals:   07/24/18 1447  BP: (!) 110/58  Pulse: 93  SpO2: 99%   General: No acute distress, less anxious today Head:  Normocephalic/atraumatic Neck: supple, no paraspinal tenderness, full range of motion Heart:  Regular rate and rhythm Lungs:  Clear to auscultation bilaterally Back: No paraspinal tenderness Skin/Extremities: No rash, no edema Neurological Exam: alert and  oriented to person, place, and time. No aphasia or dysarthria. Fund of knowledge is appropriate.  Recent and remote memory are intact.  Attention and concentration are normal.   Cranial nerves: Pupils equal, round. EOMs intact, reporting monocular diplopia on left eye. No facial asymmetry. Tongue, uvula, palate midline.  Motor: 5/5 throughout. No incoordination on finger to nose testing. Gait narrow-based and steady, able to tandem walk adequately.  IMPRESSION: This is a 38 yo RH woman with a history of grade 2 oligodengroglioma s/p subtotal resection on chemotherapy, anxiety, headaches, with recurrent episodes of low amplitude tremors, palpitations, and right facial tightening, as well as left face and arm numbness. She had a 48-hour EEG which did not show any epileptiform discharges. Her [facility] showed breach artifact, otherwise no epileptiform discharges. She did not have typical events and hence these could not be commented on. She was reporting an increase in seizures, and had another 72-hour EEG which did not show any epileptiform discharges, she had multiple push button events for dizziness, stuttering, headache, word-finding difficulties, with no EEG correlate. At times her heart rate went up to 150bpm during these events. She has not been back to her cardiologist. 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 We agreed to increase gabapentin 365m 4 tablets twice a day, continue Depakote ER 2540m2 tabs in AM, 3 tabs in PM.  She is aware of [place] driving laws to stop driving after a seizure until 6 months seizure-free. Follow-up in 6 months, she knows to call for any changes.   Thank you for allowing me to participate in her care.  Please do not hesitate to call for any questions or concerns.  The duration of this appointment visit was 30  minutes of face-to-face time with the patient.  Greater than 50% of this time was spent in counseling, explanation of diagnosis, planning of further management, and coordination of care.   Ellouise Newer, M.D.   CC: Dr. Alvy Bimler, Dr. Irish Lack

## 2018-07-25 ENCOUNTER — Telehealth: Payer: Self-pay

## 2018-07-25 DIAGNOSIS — M542 Cervicalgia: Secondary | ICD-10-CM | POA: Diagnosis not present

## 2018-07-25 DIAGNOSIS — M5412 Radiculopathy, cervical region: Secondary | ICD-10-CM | POA: Diagnosis not present

## 2018-07-25 DIAGNOSIS — M5022 Other cervical disc displacement, mid-cervical region, unspecified level: Secondary | ICD-10-CM | POA: Diagnosis not present

## 2018-07-25 MED FILL — BUTALB-ACETAMIN-CAFF 50-325: 50-325-40 | 22 days supply | Qty: 90 | Fill #0

## 2018-07-25 NOTE — Telephone Encounter (Signed)
She called and left a message that she is getting epidural injection in her neck today. She needs to reschedule appts.  Called [name] back. Scheduling message sent for appt next week.

## 2018-07-26 ENCOUNTER — Inpatient Hospital Stay: Payer: Medicare Other

## 2018-07-26 ENCOUNTER — Telehealth: Payer: Self-pay | Admitting: Hematology and Oncology

## 2018-07-26 ENCOUNTER — Inpatient Hospital Stay: Payer: Medicare Other | Admitting: Hematology and Oncology

## 2018-07-26 ENCOUNTER — Telehealth: Payer: Self-pay

## 2018-07-26 NOTE — Telephone Encounter (Signed)
She needs a refill on Hydrocodone and ask that it be sent to pharmacy. It is a little early, last filled 12/30.  She needs to reschedule appt next week to a different time/ date. Scheduling message sent to call and reschedule appt.

## 2018-07-26 NOTE — Telephone Encounter (Signed)
R/s appt per 1/8 sch message - pt husband is aware of apt date and time

## 2018-07-27 ENCOUNTER — Telehealth: Payer: Self-pay | Admitting: Hematology and Oncology

## 2018-07-27 ENCOUNTER — Other Ambulatory Visit: Payer: Self-pay | Admitting: Hematology and Oncology

## 2018-07-27 NOTE — Telephone Encounter (Signed)
Called patient with message as indicated below. Patient verbalized an understanding. She confirms appts on Friday. She reports she does not need to change them.

## 2018-07-27 NOTE — Telephone Encounter (Signed)
Call pt per 1/09 sch  Message - unable to reach patient - left message for patient to call back

## 2018-07-27 NOTE — Telephone Encounter (Signed)
pls call her back Too early for refill Call back next week on 1/14

## 2018-07-30 ENCOUNTER — Telehealth: Payer: Self-pay

## 2018-07-30 ENCOUNTER — Other Ambulatory Visit: Payer: Self-pay | Admitting: Hematology and Oncology

## 2018-07-30 DIAGNOSIS — C719 Malignant neoplasm of brain, unspecified: Secondary | ICD-10-CM

## 2018-07-30 MED ORDER — HYDROCODONE-ACETAMINOPHEN 10-325 MG PO TABS: ORAL_TABLET | ORAL | 0 refills | Status: DC

## 2018-07-30 MED FILL — HYDROCODON-APAP 10-325: 10-325 | 15 days supply | Qty: 90 | Fill #0

## 2018-07-30 NOTE — Telephone Encounter (Signed)
I sent to [facility]

## 2018-07-30 NOTE — Telephone Encounter (Signed)
[name] called and left a message. Asking if Hydrocodone can be called in today so he could pick it up on the way home. Y[facility]i will run out today and this will save him a trip tomorrow.

## 2018-07-30 NOTE — Telephone Encounter (Signed)
Called with below message. He verbalized understanding.

## 2018-08-01 ENCOUNTER — Ambulatory Visit: Payer: Medicare Other | Admitting: Hematology and Oncology

## 2018-08-01 ENCOUNTER — Other Ambulatory Visit: Payer: Medicare Other

## 2018-08-03 ENCOUNTER — Telehealth: Payer: Self-pay | Admitting: Hematology and Oncology

## 2018-08-03 ENCOUNTER — Inpatient Hospital Stay (HOSPITAL_BASED_OUTPATIENT_CLINIC_OR_DEPARTMENT_OTHER): Payer: Medicare Other | Admitting: Hematology and Oncology

## 2018-08-03 ENCOUNTER — Inpatient Hospital Stay: Payer: Medicare Other | Attending: Hematology and Oncology

## 2018-08-03 ENCOUNTER — Encounter: Payer: Self-pay | Admitting: Hematology and Oncology

## 2018-08-03 DIAGNOSIS — C771 Secondary and unspecified malignant neoplasm of intrathoracic lymph nodes: Secondary | ICD-10-CM | POA: Diagnosis present

## 2018-08-03 DIAGNOSIS — Z79899 Other long term (current) drug therapy: Secondary | ICD-10-CM | POA: Diagnosis not present

## 2018-08-03 DIAGNOSIS — R030 Elevated blood-pressure reading, without diagnosis of hypertension: Secondary | ICD-10-CM | POA: Insufficient documentation

## 2018-08-03 DIAGNOSIS — R300 Dysuria: Secondary | ICD-10-CM | POA: Diagnosis not present

## 2018-08-03 DIAGNOSIS — R350 Frequency of micturition: Secondary | ICD-10-CM | POA: Diagnosis not present

## 2018-08-03 DIAGNOSIS — G44021 Chronic cluster headache, intractable: Secondary | ICD-10-CM | POA: Diagnosis not present

## 2018-08-03 DIAGNOSIS — C711 Malignant neoplasm of frontal lobe: Secondary | ICD-10-CM | POA: Diagnosis not present

## 2018-08-03 DIAGNOSIS — D72819 Decreased white blood cell count, unspecified: Secondary | ICD-10-CM | POA: Insufficient documentation

## 2018-08-03 DIAGNOSIS — Z9221 Personal history of antineoplastic chemotherapy: Secondary | ICD-10-CM | POA: Insufficient documentation

## 2018-08-03 DIAGNOSIS — R319 Hematuria, unspecified: Secondary | ICD-10-CM

## 2018-08-03 DIAGNOSIS — E559 Vitamin D deficiency, unspecified: Secondary | ICD-10-CM

## 2018-08-03 DIAGNOSIS — G40109 Localization-related (focal) (partial) symptomatic epilepsy and epileptic syndromes with simple partial seizures, not intractable, without status epilepticus: Secondary | ICD-10-CM

## 2018-08-03 DIAGNOSIS — R4189 Other symptoms and signs involving cognitive functions and awareness: Secondary | ICD-10-CM

## 2018-08-03 LAB — CBC WITH DIFFERENTIAL/PLATELET
Abs Immature Granulocytes: 0 10*3/uL (ref 0.00–0.07)
Basophils Absolute: 0 10*3/uL (ref 0.0–0.1)
Basophils Relative: 1 %
Eosinophils Absolute: 0 10*3/uL (ref 0.0–0.5)
Eosinophils Relative: 1 %
HCT: 35.5 % — ABNORMAL LOW (ref 36.0–46.0)
Hemoglobin: 12.1 g/dL (ref 12.0–15.0)
Immature Granulocytes: 0 %
Lymphocytes Relative: 38 %
Lymphs Abs: 1 10*3/uL (ref 0.7–4.0)
MCH: 33.3 pg (ref 26.0–34.0)
MCHC: 34.1 g/dL (ref 30.0–36.0)
MCV: 97.8 fL (ref 80.0–100.0)
Monocytes Absolute: 0.2 10*3/uL (ref 0.1–1.0)
Monocytes Relative: 9 %
Neutro Abs: 1.4 10*3/uL — ABNORMAL LOW (ref 1.7–7.7)
Neutrophils Relative %: 51 %
Platelets: 191 10*3/uL (ref 150–400)
RBC: 3.63 MIL/uL — ABNORMAL LOW (ref 3.87–5.11)
RDW: 11.2 % — ABNORMAL LOW (ref 11.5–15.5)
WBC: 2.6 10*3/uL — ABNORMAL LOW (ref 4.0–10.5)
nRBC: 0 % (ref 0.0–0.2)

## 2018-08-03 LAB — URINALYSIS, COMPLETE (UACMP) WITH MICROSCOPIC
Bacteria, UA: NONE SEEN
Bilirubin Urine: NEGATIVE
Glucose, UA: NEGATIVE mg/dL
Hgb urine dipstick: NEGATIVE
Ketones, ur: 5 mg/dL — AB
Leukocytes, UA: NEGATIVE
Nitrite: NEGATIVE
Protein, ur: NEGATIVE mg/dL
Specific Gravity, Urine: 1.031 — ABNORMAL HIGH (ref 1.005–1.030)
pH: 5 (ref 5.0–8.0)

## 2018-08-03 NOTE — Assessment & Plan Note (Signed)
She was seen by neurologist recently and was recommended Depakote and gabapentin She does not want to take regular Depakote because she is attributing that to causing some of the side effects I do not recommend her to deviate from recommendation by neurologist due to the significance of uncontrolled seizures in the long-term that could impair her memory.

## 2018-08-03 NOTE — Assessment & Plan Note (Signed)
The patient is noted to have mild cognitive impairment with poor memory likely due to medication side effects or recurrent seizures She was recommended neuropsychiatric testing but for some reason that was canceled I was not able to comment on why is was cancelled but recommend she reschedule and discuss this further with her neurologist

## 2018-08-03 NOTE — Assessment & Plan Note (Signed)
She has significant elevated blood pressure without diagnosis of hypertension We discussed the importance of getting her blood pressure under control and recommended her to go to the emergency department but she declines

## 2018-08-03 NOTE — Progress Notes (Signed)
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 The Victoria Bates has frequent seizures and headaches despite stable MRI scan She has question about description of her most recent MRI and whether she might have chronic brain bleed I do not believe she has chronic brain bleed in the description about mineralization could be due to prior history of hemorrhage related to surgery We discussed the timing of the next imaging study and she would like to delay that until October of this year which I think is reasonable  Simple partial seizures She was seen by neurologist recently and was recommended Depakote and gabapentin She does not want to take regular Depakote because she is attributing that to causing some of the side effects I do not recommend her to deviate from recommendation by neurologist due to the significance of uncontrolled seizures in the long-term that could impair her memory.  Cognitive impairment The Victoria Bates is noted to have mild cognitive impairment with poor memory likely due to medication side effects or recurrent seizures She was recommended neuropsychiatric testing but for some reason that was canceled I was not able to comment on why is was cancelled but recommend she reschedule and discuss this further with her neurologist  Intractable chronic cluster headache The Victoria Bates has chronic headaches and take regular doses of Fioricet and hydrocodone.  I  do not plan to escalate her chronic pain management  Dysuria She has recent frequent urination, dysuria and hematuria I am concerned about untreated urinary tract infection I will order urinalysis and urine culture today  Elevated BP without diagnosis of hypertension She has significant elevated blood pressure without diagnosis of hypertension We discussed the importance of getting her blood pressure under control and recommended her to go to the emergency department but she declines  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 could be induced by medication side effects She is not symptomatic Observe only   Orders Placed This Encounter  Procedures  . Urine Culture    Standing Status:   Future    Number of Occurrences:   1    Standing Expiration Date:   09/07/2019  . Urinalysis, Complete w Microscopic    Standing Status:   Future    Number of Occurrences:   1    Standing Expiration Date:   08/04/2019    INTERVAL HISTORY: Please see below for problem oriented charting. She returns with her husband today She was a little late for her appointment She complained of significant dysuria, urinary frequency and hematuria over the past few days.  She took some oral antibiotics recently but did not feel much symptomatic improvement She denies nausea or fever She continues to have frequent seizures She did not take Depakote recently because it does not make her feel well but she felt that increased dose Neurontin was helpful. 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    09/27/2013 Pathology Results    Accession: [id number] pathology showed grade 2 oligodendroglioma with mutant IDH1 protein, ATRX, OLIG 2 expression. p53 is negative, low Ki-67 labeling index. Molecular SNP array studies demonstrate whole arm 1p19q codeletion."    10/28/2013 Imaging    Ct scan of head showed possible abscess.    11/06/2013 Imaging    Repeat CT scan of the head showed resolution of abscess.    01/23/2014 Surgery    Dr. Christella Noa perform a subtotal gross resection of the tumor.    01/24/2014 Imaging    Repeat imaging studies showed persistent and residual disease.    03/03/2014 - 09/01/2014 Chemotherapy    She was started on Temodar, 5 days every 28 days    03/13/2014 Imaging    Repeat MRI showed no evidence of progression of disease.    07/14/2014 Imaging    Repeat MRI showed no evidence of disease progression.    09/12/2014 Imaging    MRI of the liver show mild regression of the size of lesions, presumed to be benign    09/15/2014 Imaging    Repeat MRI of the brain show persistent abnormalities, suspicious for residual disease    03/02/2015 Imaging     MRI brain at [facility] show persistent abnormalities    08/10/2015 Imaging    Repeat MRI [facility] showed stable disease    08/24/2015 - 02/08/2016 Chemotherapy    She was started on Lomustine, Procarbazine and Vincristine chemotherapy    08/30/2015 Imaging    CT chest showed no evidence of aspiration pneumonia    11/18/2015 Imaging    MRI brain showed status post resection of right frontal lobe tumor without residual or recurrent tumor. 2. Otherwise negative MRI of the brain.     03/02/2016 Imaging    Stable MRI post right frontal lobe tumor resection. Negative for recurrent tumor    06/01/2016 Imaging    Stable MRI post right frontal lobe tumor resection. No recurrent tumor identified    08/19/2016 Imaging    Unchanged examination without tumor recurrence.    11/17/2016 Imaging    Stable right frontal  resection cavity. Stable focal mass effect within the right middle frontal gyrus at the superior margin of the cavity which may represent nonenhancing neoplasm. No evidence for tumor progression. Otherwise stable MRI of the brain    12/16/2016 Imaging    MR brain: 1. No acute intracranial abnormality. 2. Unchanged appearance of right frontal lobe resection cavity and surrounding hyperintense T2 weighted signal.    02/04/2017 Imaging    Repeat MRI Normal pituitary  Stable postop changes of right frontal tumor resection    05/12/2017 Imaging    The left ventricular size is normal. There is normal left ventricular wall thickness.  Left ventricular systolic function is normal. LV ejection fraction = 60-65%.  Left ventricular filling pattern is normal. The left ventricular wall motion is normal. The right ventricle is normal in size and function. There is no significant valvular stenosis or regurgitation No pulmonary hypertension. IVC size was normal. There is no pericardial effusion. There is no comparison study available    05/13/2017 Imaging    1.No evidence of acute pulmonary thromboembolic disease. 2.There is an 8 mm right lower lobe groundglass nodule. Statistically, in this  age group, this is most likely to reflect benign etiologies such as a tiny focus of infection or nonspecific inflammation. 4 mm solid right upper lobe lung nodule. A CT scan could be obtained in 12 months to further evaluate if the Victoria Bates has any known risk factors for malignancy. 3.There is a 1.3 cm left adrenal nodule. This finding is incompletely characterized although and is slightly increased in size relative to the abdominal MRI from February 2016. Although this finding is most likely benign, recommend follow-up adrenal protocol CT in one year.    05/13/2017 Imaging    MRI brain 1.No acute intracranial abnormality. 2.Postsurgical changes of a right frontal craniotomy for resection of a  reported oligodendroglioma. Hyperintense T2/FLAIR signal around the resection cavity and in the subcortical white matter of a gyrus along the superior margin of the cavity is unchanged from 01/05/2015. No evidence of disease progression.    12/15/2017 Imaging    MRI brain at [facility] 1.No acute intracranial abnormality. 2.Postsurgical changes of right frontal craniotomy for resection of reported oligodendroglioma. No evidence of disease progression    12/28/2017 Procedure    Successful right IJ vein Port-A-Cath explant.    04/24/2018 Imaging    Stable post treatment appearance, RIGHT frontal oligodendroglioma.  No posttraumatic sequelae are evident.     REVIEW OF SYSTEMS:   Constitutional: Denies fevers, chills or abnormal weight loss Eyes: Denies blurriness of vision Ears, nose, mouth, throat, and face: Denies mucositis or sore throat Respiratory: Denies cough, dyspnea or wheezes Cardiovascular: Denies palpitation, chest discomfort or lower extremity swelling Gastrointestinal:  Denies nausea, heartburn or change in bowel habits Skin: Denies abnormal skin rashes Lymphatics: Denies new lymphadenopathy or easy bruising Neurological:Denies numbness, tingling or new weaknesses Behavioral/Psych: Mood is stable, no new changes  All other systems were reviewed with the Victoria Bates and are negative.  I have reviewed the past medical history, past surgical history, social history and family history with the Victoria Bates and they are unchanged from previous note.  ALLERGIES:  is allergic to adhesive [tape]; latex; percocet [oxycodone-acetaminophen]; shellfish allergy; skin adhesives; soma [carisoprodol]; tramadol hcl; vancomycin; and zofran [ondansetron hcl].  MEDICATIONS:  Current Outpatient Medications  Medication Sig Dispense Refill  . butalbital-acetaminophen-caffeine (FIORICET, ESGIC) 50-325-40 MG tablet TAKE 1 TABLET BY MOUTH EVERY 6 HOURS AS NEEDED FOR HEADACHE 90 tablet 0  .  carisoprodol (SOMA) 350 MG tablet TAKE 1 TABLET BY MOUTH 4 TIMES DAILY FOR SEIZURE PREVENTION 360 tablet 1  . Cyanocobalamin (VITAMIN B-12 PO) Take 5,000 Units daily by mouth.     . divalproex (DEPAKOTE ER) 250 MG 24 hr tablet Take 2 tabs in AM, 3 tabs in PM 450 tablet 3  . EPINEPHRINE 0.3 mg/0.3 mL IJ SOAJ injection INJECT 0.3 MLS (ONE SYRINGE) INTO THE MUSCLE ONCE FOR 1 DOSE AS NEEDED. (Victoria Bates taking differently: Inject 0.3 mg into the muscle once. ) 0.3 mL 0  . gabapentin (NEURONTIN) 300 MG capsule Take 4 capsules twice a day 720 capsule 3  . HYDROcodone-acetaminophen (NORCO) 10-325 MG tablet TAKE 1 TABLET BY MOUTH EVERY 4 HOURS AS NEEDED FOR MODERATE PAIN 90 tablet 0  . ibuprofen (ADVIL,MOTRIN) 600 MG tablet Take 1 tablet (600 mg total) by mouth every 6 (six) hours as needed. (Victoria Bates taking differently: Take 600 mg by mouth every 6 (six) hours as needed for headache, mild pain or moderate pain. ) 30 tablet 0  . levocetirizine (XYZAL) 5 MG tablet TAKE 1 TABLET BY MOUTH EVERY EVENING. 60 tablet 5  .  LORazepam (ATIVAN) 1 MG tablet Take 1 tablet (1 mg total) by mouth every 8 (eight) hours as needed for anxiety. 90 tablet 0  . montelukast (SINGULAIR) 10 MG tablet TAKE 1 TABLET BY MOUTH ONCE DAILY AT BEDTIME (Victoria Bates taking differently: Take 10 mg by mouth at bedtime. ) 90 tablet 3  . ondansetron (ZOFRAN) 8 MG tablet Take 1 tablet (8 mg total) by mouth every 8 (eight) hours as needed for nausea or vomiting. 30 tablet 0  . promethazine (PHENERGAN) 25 MG tablet Take 25 mg every 6 (six) hours as needed by mouth for nausea or vomiting.    . Vitamin D, Ergocalciferol, (DRISDOL) 50000 units CAPS capsule Take 50,000 Units by mouth every 7 (seven) days.     No current facility-administered medications for this visit.     PHYSICAL EXAMINATION: ECOG PERFORMANCE STATUS: 1 - Symptomatic but completely ambulatory  Vitals:   08/03/18 1416  BP: 123/67  Pulse: 61  Resp: 18  Temp: (!) 97.5 F (36.4 C)   SpO2: 100%   Filed Weights   08/03/18 1416  Weight: 146 lb 3.2 oz (66.3 kg)    GENERAL:alert, no distress and comfortable Musculoskeletal:no cyanosis of digits and no clubbing  NEURO: alert & oriented x 3 with occasional dysarthria  LABORATORY DATA:  I have reviewed the data as listed    Component Value Date/Time   NA 140 03/26/2018 2349   NA 140 06/15/2017 1207   K 3.6 03/26/2018 2349   K 3.9 06/15/2017 1207   CL 105 03/26/2018 2349   CO2 26 03/26/2018 2349   CO2 24 06/15/2017 1207   GLUCOSE 92 03/26/2018 2349   GLUCOSE 85 06/15/2017 1207   BUN 11 03/26/2018 2349   BUN 5.8 (L) 06/15/2017 1207   CREATININE 0.55 03/26/2018 2349   CREATININE 0.7 06/15/2017 1207   CALCIUM 9.1 03/26/2018 2349   CALCIUM 8.5 06/15/2017 1207   PROT 6.8 09/21/2017 1252   PROT 6.7 06/15/2017 1207   ALBUMIN 3.7 09/21/2017 1252   ALBUMIN 3.9 06/15/2017 1207   AST 17 09/21/2017 1252   AST 14 06/15/2017 1207   ALT 18 09/21/2017 1252   ALT 11 06/15/2017 1207   ALKPHOS 49 09/21/2017 1252   ALKPHOS 42 06/15/2017 1207   BILITOT 0.4 09/21/2017 1252   BILITOT 0.36 06/15/2017 1207   GFRNONAA >60 03/26/2018 2349   GFRAA >60 03/26/2018 2349    No results found for: SPEP, UPEP  Lab Results  Component Value Date   WBC 2.6 (L) 08/03/2018   NEUTROABS 1.4 (L) 08/03/2018   HGB 12.1 08/03/2018   HCT 35.5 (L) 08/03/2018   MCV 97.8 08/03/2018   PLT 191 08/03/2018      Chemistry      Component Value Date/Time   NA 140 03/26/2018 2349   NA 140 06/15/2017 1207   K 3.6 03/26/2018 2349   K 3.9 06/15/2017 1207   CL 105 03/26/2018 2349   CO2 26 03/26/2018 2349   CO2 24 06/15/2017 1207   BUN 11 03/26/2018 2349   BUN 5.8 (L) 06/15/2017 1207   CREATININE 0.55 03/26/2018 2349   CREATININE 0.7 06/15/2017 1207      Component Value Date/Time   CALCIUM 9.1 03/26/2018 2349   CALCIUM 8.5 06/15/2017 1207   ALKPHOS 49 09/21/2017 1252   ALKPHOS 42 06/15/2017 1207   AST 17 09/21/2017 1252   AST 14  06/15/2017 1207   ALT 18 09/21/2017 1252   ALT 11 06/15/2017 1207   BILITOT  0.4 09/21/2017 1252   BILITOT 0.36 06/15/2017 1207       All questions were answered. The Victoria Bates knows to call the clinic with any problems, questions or concerns. No barriers to learning was detected.  I spent 25 minutes counseling the Victoria Bates face to face. The total time spent in the appointment was 30 minutes and more than 50% was on counseling and review of test results  Heath Lark, MD 08/03/2018 5:04 PM

## 2018-08-03 NOTE — Assessment & Plan Note (Signed)
She has recent frequent urination, dysuria and hematuria I am concerned about untreated urinary tract infection I will order urinalysis and urine culture today

## 2018-08-03 NOTE — Assessment & Plan Note (Signed)
She has chronic leukopenia could be induced by medication side effects She is not symptomatic Observe only 

## 2018-08-03 NOTE — Telephone Encounter (Signed)
Gave avs and calendar ° °

## 2018-08-03 NOTE — Assessment & Plan Note (Signed)
The patient has frequent seizures and headaches despite stable MRI scan She has question about description of her most recent MRI and whether she might have chronic brain bleed I do not believe she has chronic brain bleed in the description about mineralization could be due to prior history of hemorrhage related to surgery We discussed the timing of the next imaging study and she would like to delay that until October of this year which I think is reasonable

## 2018-08-03 NOTE — Assessment & Plan Note (Signed)
The patient has chronic headaches and take regular doses of Fioricet and hydrocodone.  I do not plan to escalate her chronic pain management

## 2018-08-04 LAB — URINE CULTURE

## 2018-08-06 ENCOUNTER — Telehealth: Payer: Self-pay

## 2018-08-06 NOTE — Telephone Encounter (Signed)
Called with below message. She verbalized understanding. She will call PCP office and see if they have appt.

## 2018-08-06 NOTE — Telephone Encounter (Signed)
-----   Message from Heath Lark, MD sent at 08/06/2018  8:24 AM EST ----- Regarding: urine test Let her know urine test is not conclusive for UTI I recommend her to either see her PCP for further work up or refer her to see urologist  ----- Message ----- From: Buel Ream, [facility] [place] Sent: 08/03/2018   1:54 PM EST To: Heath Lark, MD

## 2018-08-13 ENCOUNTER — Telehealth: Payer: Self-pay | Admitting: *Deleted

## 2018-08-13 ENCOUNTER — Other Ambulatory Visit: Payer: Self-pay | Admitting: Hematology and Oncology

## 2018-08-13 DIAGNOSIS — C719 Malignant neoplasm of brain, unspecified: Secondary | ICD-10-CM

## 2018-08-13 MED ORDER — LORAZEPAM 1 MG PO TABS: 1.0000 mg | ORAL_TABLET | Freq: Three times a day (TID) | ORAL | 0 refills | Status: DC | PRN

## 2018-08-13 MED ORDER — HYDROCODONE-ACETAMINOPHEN 10-325 MG PO TABS: ORAL_TABLET | ORAL | 0 refills | Status: DC

## 2018-08-13 MED FILL — HYDROCODON-APAP 10-325: 10-325 | 15 days supply | Qty: 90 | Fill #0

## 2018-08-13 MED FILL — LORazepam 1 MG TABS: 1 | 30 days supply | Qty: 90 | Fill #0

## 2018-08-13 NOTE — Telephone Encounter (Signed)
done

## 2018-08-13 NOTE — Telephone Encounter (Signed)
See now there is lorazepam refill requests too Does she need lorazepam refill?

## 2018-08-13 NOTE — Telephone Encounter (Signed)
Pt states she also needs refill on Lorazepam
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
## 2018-08-13 NOTE — Telephone Encounter (Signed)
TCT patient to make her aware of pain med refills. She voiced understanding. She does not want a pain clinic referral at this time.

## 2018-08-13 NOTE — Telephone Encounter (Signed)
No, I do not want to escalate her pain medicine anymore If she wants more aggressive pain management I can refer to pain clinic for further management Where does she wants her pain medicine refills?

## 2018-08-13 NOTE — Telephone Encounter (Signed)
TCT patient regarding pain management.  Spoke with Estill Bamberg.  She states that she is still having significant pain in her cervical and lumbar spine.   She is receiving epidural injections and physical therapy to help with the pain as well. She is using the hydrocodone every 4 hours, but is wondering if she can take 1.5 to 2 tablets every 6 hours.  She does not want to use a stronger pain medication.  She does not need the Fioricet until the end of the week.

## 2018-08-13 NOTE — Telephone Encounter (Signed)
TCT patient regarding pain management.  Spoke with Victoria Bates.  She states she is still using the hydrocodone every 4 hours. She states the pain in her back is still there, especially when just standing, making dinner or walking in stores on the hard floors.  She is getting epidural injections to her neck and lower back, as well as physical therapy. [name] is asking if she can 1.5 to 2 tablets of the hydrocodone every 6 hours instead of 1 every 4 hours.  She is ok with whatever Dr. Alvy Bimler decides. She doesn't need the Fioricet until the end of the week.  She thought that getting them both refilled would be easier.

## 2018-08-16 ENCOUNTER — Other Ambulatory Visit: Payer: Self-pay | Admitting: Hematology and Oncology

## 2018-08-16 DIAGNOSIS — M5416 Radiculopathy, lumbar region: Secondary | ICD-10-CM | POA: Diagnosis not present

## 2018-08-16 DIAGNOSIS — M5022 Other cervical disc displacement, mid-cervical region, unspecified level: Secondary | ICD-10-CM | POA: Diagnosis not present

## 2018-08-16 DIAGNOSIS — M546 Pain in thoracic spine: Secondary | ICD-10-CM | POA: Diagnosis not present

## 2018-08-16 MED ORDER — BUTALBITAL-APAP-CAFFEINE 50-325-40 MG PO TABS: ORAL_TABLET | ORAL | 0 refills | Status: DC

## 2018-08-16 MED FILL — BUTALB-ACETAMIN-CAFF 50-325: 50-325-40 | 8 days supply | Qty: 32 | Fill #0

## 2018-08-16 MED FILL — tiZANidine HCL 4 MG TABS: 4 | 10 days supply | Qty: 30 | Fill #0

## 2018-08-23 DIAGNOSIS — M545 Low back pain: Secondary | ICD-10-CM | POA: Diagnosis not present

## 2018-08-23 DIAGNOSIS — M5416 Radiculopathy, lumbar region: Secondary | ICD-10-CM | POA: Diagnosis not present

## 2018-08-23 MED FILL — tiZANidine HCL 4 MG TABS: 4 | 10 days supply | Qty: 30 | Fill #0

## 2018-08-27 ENCOUNTER — Other Ambulatory Visit: Payer: Self-pay | Admitting: Hematology and Oncology

## 2018-08-27 DIAGNOSIS — C719 Malignant neoplasm of brain, unspecified: Secondary | ICD-10-CM

## 2018-08-27 MED ORDER — HYDROCODONE-ACETAMINOPHEN 10-325 MG PO TABS: ORAL_TABLET | ORAL | 0 refills | Status: DC

## 2018-08-27 MED FILL — HYDROCODON-APAP 10-325: 10-325 | 15 days supply | Qty: 90 | Fill #0

## 2018-08-27 MED FILL — BUTALB-ACETAMIN-CAFF 50-325: 50-325-40 | 8 days supply | Qty: 32 | Fill #1

## 2018-08-27 NOTE — Telephone Encounter (Signed)
done

## 2018-08-27 NOTE — Telephone Encounter (Signed)
Patient is requesting refill of her Hydrocodone. She would like this sent to [facility].

## 2018-09-03 MED FILL — LEVOCETIRIZINE 5 MG TABLET: 5 | 60 days supply | Qty: 60 | Fill #1

## 2018-09-04 DIAGNOSIS — M5136 Other intervertebral disc degeneration, lumbar region: Secondary | ICD-10-CM | POA: Diagnosis not present

## 2018-09-04 DIAGNOSIS — D3502 Benign neoplasm of left adrenal gland: Secondary | ICD-10-CM | POA: Diagnosis not present

## 2018-09-04 DIAGNOSIS — Z0000 Encounter for general adult medical examination without abnormal findings: Secondary | ICD-10-CM | POA: Diagnosis not present

## 2018-09-04 DIAGNOSIS — J302 Other seasonal allergic rhinitis: Secondary | ICD-10-CM | POA: Diagnosis not present

## 2018-09-04 DIAGNOSIS — G40909 Epilepsy, unspecified, not intractable, without status epilepticus: Secondary | ICD-10-CM | POA: Diagnosis not present

## 2018-09-04 DIAGNOSIS — E2740 Unspecified adrenocortical insufficiency: Secondary | ICD-10-CM | POA: Diagnosis not present

## 2018-09-04 DIAGNOSIS — C719 Malignant neoplasm of brain, unspecified: Secondary | ICD-10-CM | POA: Diagnosis not present

## 2018-09-04 DIAGNOSIS — D069 Carcinoma in situ of cervix, unspecified: Secondary | ICD-10-CM | POA: Diagnosis not present

## 2018-09-04 DIAGNOSIS — M5030 Other cervical disc degeneration, unspecified cervical region: Secondary | ICD-10-CM | POA: Diagnosis not present

## 2018-09-04 DIAGNOSIS — J4520 Mild intermittent asthma, uncomplicated: Secondary | ICD-10-CM | POA: Diagnosis not present

## 2018-09-04 DIAGNOSIS — Z1389 Encounter for screening for other disorder: Secondary | ICD-10-CM | POA: Diagnosis not present

## 2018-09-04 DIAGNOSIS — R41840 Attention and concentration deficit: Secondary | ICD-10-CM | POA: Diagnosis not present

## 2018-09-04 MED FILL — AMPHETAMINE-DEXTROAMPHET ER: 10 | 30 days supply | Qty: 30 | Fill #0

## 2018-09-06 MED FILL — CYCLOBENZAPRINE HCL 10 MG T: 10 | 10 days supply | Qty: 30 | Fill #0

## 2018-09-10 ENCOUNTER — Other Ambulatory Visit: Payer: Self-pay | Admitting: Hematology and Oncology

## 2018-09-10 DIAGNOSIS — C719 Malignant neoplasm of brain, unspecified: Secondary | ICD-10-CM

## 2018-09-10 MED FILL — GABAPENTIN 300 MG CAPSULE: 300 | 90 days supply | Qty: 720 | Fill #0

## 2018-09-10 MED FILL — BUTALB-ACETAMIN-CAFF 50-325: 50-325-40 | 22 days supply | Qty: 90 | Fill #0

## 2018-09-10 MED FILL — HYDROCODON-APAP 10-325: 10-325 | 15 days supply | Qty: 90 | Fill #0

## 2018-09-24 ENCOUNTER — Telehealth: Payer: Self-pay

## 2018-09-24 ENCOUNTER — Other Ambulatory Visit: Payer: Self-pay | Admitting: Hematology and Oncology

## 2018-09-24 DIAGNOSIS — C711 Malignant neoplasm of frontal lobe: Secondary | ICD-10-CM

## 2018-09-24 DIAGNOSIS — C719 Malignant neoplasm of brain, unspecified: Secondary | ICD-10-CM

## 2018-09-24 DIAGNOSIS — R519 Headache, unspecified: Secondary | ICD-10-CM

## 2018-09-24 DIAGNOSIS — R0789 Other chest pain: Secondary | ICD-10-CM

## 2018-09-24 DIAGNOSIS — R110 Nausea: Secondary | ICD-10-CM

## 2018-09-24 DIAGNOSIS — R51 Headache: Secondary | ICD-10-CM

## 2018-09-24 DIAGNOSIS — R4702 Dysphasia: Secondary | ICD-10-CM

## 2018-09-24 DIAGNOSIS — R202 Paresthesia of skin: Secondary | ICD-10-CM

## 2018-09-24 MED FILL — HYDROCODON-APAP 10-325: 10-325 | 15 days supply | Qty: 90 | Fill #0

## 2018-09-24 NOTE — Telephone Encounter (Signed)
Pt called to request prescriptions for hydrocodone and Fioracet.  Refills not due until 09/25/18.  Prescriptions will be sent to her pharmacy on 09/25/18.

## 2018-10-01 ENCOUNTER — Other Ambulatory Visit: Payer: Self-pay | Admitting: Hematology and Oncology

## 2018-10-01 MED FILL — BUTALB-ACETAMIN-CAFF 50-325: 50-325-40 | 22 days supply | Qty: 90 | Fill #0

## 2018-10-01 MED FILL — CARISOPRODOL 350 MG TABS: 350 | 30 days supply | Qty: 120 | Fill #0

## 2018-10-08 ENCOUNTER — Other Ambulatory Visit: Payer: Self-pay | Admitting: Hematology and Oncology

## 2018-10-08 ENCOUNTER — Telehealth: Payer: Self-pay

## 2018-10-08 DIAGNOSIS — T50905D Adverse effect of unspecified drugs, medicaments and biological substances, subsequent encounter: Secondary | ICD-10-CM

## 2018-10-08 DIAGNOSIS — J4520 Mild intermittent asthma, uncomplicated: Secondary | ICD-10-CM

## 2018-10-08 DIAGNOSIS — C719 Malignant neoplasm of brain, unspecified: Secondary | ICD-10-CM

## 2018-10-08 MED ORDER — LORAZEPAM 1 MG PO TABS: 1.0000 mg | ORAL_TABLET | Freq: Three times a day (TID) | ORAL | 0 refills | Status: DC | PRN

## 2018-10-08 MED ORDER — LEVOCETIRIZINE DIHYDROCHLORIDE 5 MG PO TABS: 5.0000 mg | ORAL_TABLET | Freq: Every evening | ORAL | 5 refills | Status: DC

## 2018-10-08 MED ORDER — HYDROCODONE-ACETAMINOPHEN 10-325 MG PO TABS: 1.0000 | ORAL_TABLET | ORAL | 0 refills | Status: DC | PRN

## 2018-10-08 MED ORDER — MONTELUKAST SODIUM 10 MG PO TABS: 10.0000 mg | ORAL_TABLET | Freq: Every day | ORAL | 9 refills | Status: DC

## 2018-10-08 MED FILL — LORazepam 1 MG TABS: 1 | 30 days supply | Qty: 90 | Fill #0

## 2018-10-08 MED FILL — LEVOCETIRIZINE 5 MG TABLET: 5 | 30 days supply | Qty: 30 | Fill #0

## 2018-10-08 MED FILL — HYDROCODON-APAP 10-325: 10-325 | 15 days supply | Qty: 90 | Fill #0

## 2018-10-08 MED FILL — MONTELUKAST SOD 10 MG TAB: 10 | 30 days supply | Qty: 30 | Fill #0

## 2018-10-08 NOTE — Telephone Encounter (Signed)
She called and left a message.  Called her back and [name] out front in the lobby. Requesting refills be sent to [facility]. Requesting 1 month worth of Ativan and Hydrocodone if possible. Needs also needs refills on Singular and Zyzal.

## 2018-10-08 NOTE — Telephone Encounter (Signed)
Called and given [name], husband below message. He verbalized understanding.
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
## 2018-10-09 MED FILL — ADDERALL XR 15 MG CAP SA: 15 | 30 days supply | Qty: 30 | Fill #0

## 2018-10-11 MED FILL — DIVALPROEX SOD ER 250 MG TA: 250 | 90 days supply | Qty: 450 | Fill #0

## 2018-10-11 MED FILL — CYCLOBENZAPRINE HCL 10 MG T: 10 | 10 days supply | Qty: 30 | Fill #0

## 2018-10-19 ENCOUNTER — Other Ambulatory Visit: Payer: Self-pay | Admitting: Hematology and Oncology

## 2018-10-19 DIAGNOSIS — C719 Malignant neoplasm of brain, unspecified: Secondary | ICD-10-CM

## 2018-10-19 MED FILL — CARISOPRODOL 350 MG TABS: 350 | 50 days supply | Qty: 200 | Fill #1

## 2018-10-22 MED FILL — BUTALBITAL-APAP-CAFFEINE 50: 50-325-40 | 23 days supply | Qty: 90 | Fill #0

## 2018-10-22 MED FILL — HYDROCODON-APAP 10-325: 10-325 | 15 days supply | Qty: 90 | Fill #0

## 2018-11-01 ENCOUNTER — Other Ambulatory Visit: Payer: Self-pay | Admitting: Hematology and Oncology

## 2018-11-01 DIAGNOSIS — C719 Malignant neoplasm of brain, unspecified: Secondary | ICD-10-CM

## 2018-11-05 ENCOUNTER — Other Ambulatory Visit: Payer: Self-pay | Admitting: Medical

## 2018-11-05 ENCOUNTER — Telehealth: Payer: Self-pay | Admitting: *Deleted

## 2018-11-05 ENCOUNTER — Other Ambulatory Visit: Payer: Self-pay | Admitting: Hematology and Oncology

## 2018-11-05 DIAGNOSIS — C719 Malignant neoplasm of brain, unspecified: Secondary | ICD-10-CM

## 2018-11-05 MED ORDER — LORAZEPAM 1 MG PO TABS: 1.0000 mg | ORAL_TABLET | Freq: Three times a day (TID) | ORAL | 0 refills | Status: DC | PRN

## 2018-11-05 NOTE — Telephone Encounter (Signed)
Patient's husband called to request refills of Ativan and Hydrocodone sent to [facility]. Husband states they have been unable to get Victoria Bates's normal seizure medication and she has increased tremors and seizure activity. Tremors are so strong she is not able to hold things. Refill of Ativan sent to Sandi Mealy, PA for review. Due to the notes in the chart about tapering off Hydrocodone patient made aware that this would be reviewed by Dr. Alvy Bimler tomorrow upon return to the office. Patient agreed and will follow up with collab nurse tomorrow.

## 2018-11-06 ENCOUNTER — Telehealth: Payer: Self-pay

## 2018-11-06 ENCOUNTER — Other Ambulatory Visit: Payer: Self-pay | Admitting: Hematology and Oncology

## 2018-11-06 DIAGNOSIS — C719 Malignant neoplasm of brain, unspecified: Secondary | ICD-10-CM

## 2018-11-06 MED ORDER — HYDROCODONE-ACETAMINOPHEN 10-325 MG PO TABS: 1.0000 | ORAL_TABLET | ORAL | 0 refills | Status: DC | PRN

## 2018-11-06 MED ORDER — LORAZEPAM 1 MG PO TABS: 1.0000 mg | ORAL_TABLET | Freq: Three times a day (TID) | ORAL | 0 refills | Status: DC | PRN

## 2018-11-06 MED FILL — HYDROCODON-APAP 10-325: 10-325 | 15 days supply | Qty: 90 | Fill #0

## 2018-11-06 MED FILL — LORazepam 1 MG TABS: 1 | 30 days supply | Qty: 90 | Fill #0

## 2018-11-06 NOTE — Telephone Encounter (Signed)
Spoke with spouse, Scientist, physiological. Gave below information from Dr Alvy Bimler. He states tremors seem to have gotten worse since pharmacy does not have [employer] brand of carisoprodol d/t back order.   He is agreeable to informing her neurologist of these issues as well.  He will speak with Estill Bamberg regarding MRI and let us know of her decision. No further needs at this time.

## 2018-11-06 NOTE — Telephone Encounter (Signed)
These are my suggestions: 1) He needs to make sure Denali is taking her pills correctly due to her poor memory 2) He needs to call her neurologist for guidance 3) The only thing I can offer is to repeat MRI to make sure nothing new is going. This was discussed with Victoria Bates in the past. If he agrees, let me know and I will order it 4) I will refill both hydrocodone and Ativan

## 2018-11-06 NOTE — Telephone Encounter (Signed)
Per Dr Alvy Bimler, [facility] contacted and given below msg. Prescription sent in on 4/20 for lorazepam has been cancelled and they will fill prescriptions sent in today by Dr Alvy Bimler.

## 2018-11-06 NOTE — Telephone Encounter (Signed)
-----   Message from Heath Lark, MD sent at 11/06/2018  8:25 AM EDT ----- Regarding: medication refills Hi,  I noted many patients called for "urgent" medication refills of pain medications, ativan or other controlled substances when I was away and Lucianne Lei has been contacted many times to manage these acute issues.  I really appreciate his help and all your help.  Just FYI: all patients are aware of medication refill policy. Nothing should be "urgent". They are educated not to call on Fridays or weekends or "last minute refills". If they have uncontrolled pain, call/page me, or double up what they have for 24 hours and have assessment the next day or go to the ER. One patient yesterday had 10/10 pain. That is not something that I can fix in the clinic with 10/10 pain; they need IV pain medication for that severe pain.  If a short gap prescription is needed, I recommend only 10 pills or less until next evaluation.  Thank you for your help

## 2018-11-06 NOTE — Telephone Encounter (Signed)
-----   Message from Heath Lark, MD sent at 11/06/2018  8:24 AM EDT ----- Regarding: ativan refill I saw Lucianne Lei had signed the order for lorazepam yesterday Can you call [facility] and see if that was picked up? If not, I want that prescription cancelled. I want the prescriptions to be under my name

## 2018-11-07 DIAGNOSIS — J301 Allergic rhinitis due to pollen: Secondary | ICD-10-CM | POA: Diagnosis not present

## 2018-11-07 DIAGNOSIS — F988 Other specified behavioral and emotional disorders with onset usually occurring in childhood and adolescence: Secondary | ICD-10-CM | POA: Diagnosis not present

## 2018-11-12 ENCOUNTER — Telehealth: Payer: Self-pay

## 2018-11-12 ENCOUNTER — Other Ambulatory Visit: Payer: Self-pay | Admitting: Hematology and Oncology

## 2018-11-12 MED FILL — AMPHETAMINE-DEXTROAMPHET ER: 15 | 30 days supply | Qty: 30 | Fill #0

## 2018-11-12 NOTE — Telephone Encounter (Signed)
Victoria Bates called and left a message to call him. Victoria Bates needs a refill on Fioricet and ask that it be sent to [facility].  Called back and told him the office would send refill tomorrow. Pharmacy sent a refill request today at 2:23 pm. He said they got a letter from the insurance company that they could get a 3 month supply. Asking for a 3 month supply if possible. Victoria Bates would like a call back tomorrow when Rx sent. He went today to pick up and they said they sent a request this morning.

## 2018-11-13 ENCOUNTER — Other Ambulatory Visit: Payer: Self-pay | Admitting: Hematology and Oncology

## 2018-11-13 MED ORDER — BUTALBITAL-APAP-CAFFEINE 50-325-40 MG PO TABS: 1.0000 | ORAL_TABLET | Freq: Four times a day (QID) | ORAL | 0 refills | Status: DC | PRN

## 2018-11-13 MED FILL — BUTALB-ACETAMIN-CAFF 50-325: 50-325-40 | 23 days supply | Qty: 90 | Fill #0

## 2018-11-13 NOTE — Telephone Encounter (Signed)
I am not comfortable refilling 3 months supply If she wants 3 months, she can ask if her PCP is willing to do it for her I will send 90 tabs to [facility]

## 2018-11-19 ENCOUNTER — Other Ambulatory Visit: Payer: Self-pay | Admitting: Hematology and Oncology

## 2018-11-19 DIAGNOSIS — C719 Malignant neoplasm of brain, unspecified: Secondary | ICD-10-CM

## 2018-11-19 MED FILL — CYCLOBENZAPRINE HCL 10 MG T: 10 | 10 days supply | Qty: 30 | Fill #0

## 2018-11-20 MED FILL — HYDROCODON-APAP 10-325: 10-325 | 15 days supply | Qty: 90 | Fill #0

## 2018-11-28 MED FILL — CYCLOBENZAPRINE HCL 10 MG T: 10 | 10 days supply | Qty: 30 | Fill #0

## 2018-12-03 ENCOUNTER — Telehealth: Payer: Self-pay | Admitting: *Deleted

## 2018-12-03 ENCOUNTER — Other Ambulatory Visit: Payer: Self-pay | Admitting: Hematology and Oncology

## 2018-12-03 DIAGNOSIS — C719 Malignant neoplasm of brain, unspecified: Secondary | ICD-10-CM

## 2018-12-03 MED ORDER — HYDROCODONE-ACETAMINOPHEN 10-325 MG PO TABS: 1.0000 | ORAL_TABLET | ORAL | 0 refills | Status: DC | PRN

## 2018-12-03 MED FILL — HYDROCODON-APAP 10-325: 10-325 | 15 days supply | Qty: 90 | Fill #0

## 2018-12-03 NOTE — Telephone Encounter (Signed)
Patient's husband called for a refill on Hydrocodone please

## 2018-12-03 NOTE — Telephone Encounter (Signed)
done

## 2018-12-06 ENCOUNTER — Other Ambulatory Visit: Payer: Self-pay | Admitting: Hematology and Oncology

## 2018-12-06 DIAGNOSIS — R519 Headache, unspecified: Secondary | ICD-10-CM

## 2018-12-06 DIAGNOSIS — R202 Paresthesia of skin: Secondary | ICD-10-CM

## 2018-12-06 DIAGNOSIS — C711 Malignant neoplasm of frontal lobe: Secondary | ICD-10-CM

## 2018-12-06 DIAGNOSIS — R0789 Other chest pain: Secondary | ICD-10-CM

## 2018-12-06 DIAGNOSIS — R110 Nausea: Secondary | ICD-10-CM

## 2018-12-06 DIAGNOSIS — R4702 Dysphasia: Secondary | ICD-10-CM

## 2018-12-06 MED FILL — CARISOPRODOL 350 MG TABS: 350 | 30 days supply | Qty: 120 | Fill #0

## 2018-12-06 MED FILL — BUTALBITAL-APAP-CAFFEINE 50: 50-325-40 | 22 days supply | Qty: 90 | Fill #0

## 2018-12-07 MED FILL — MONTELUKAST SOD 10 MG TAB: 10 | 30 days supply | Qty: 30 | Fill #1

## 2018-12-07 MED FILL — LEVOCETIRIZINE DIHYDROCHLOR: 5 | 30 days supply | Qty: 30 | Fill #1

## 2018-12-07 MED FILL — GABAPENTIN 300 MG CAPSULE: 300 | 90 days supply | Qty: 720 | Fill #1

## 2018-12-07 MED FILL — DIVALPROEX SOD ER 250 MG TA: 250 | 87 days supply | Qty: 525 | Fill #2

## 2018-12-11 ENCOUNTER — Telehealth: Payer: Self-pay

## 2018-12-11 NOTE — Telephone Encounter (Signed)
Called pharmacist back and given below message. She verbalized understanding.

## 2018-12-11 NOTE — Telephone Encounter (Signed)
OK 

## 2018-12-11 NOTE — Telephone Encounter (Signed)
Pharmacy called and left a message. Asking if it is okay to dispense 90 day prescription Soma?

## 2018-12-17 ENCOUNTER — Other Ambulatory Visit: Payer: Self-pay | Admitting: Hematology and Oncology

## 2018-12-17 DIAGNOSIS — C719 Malignant neoplasm of brain, unspecified: Secondary | ICD-10-CM

## 2018-12-17 MED FILL — LORazepam 1 MG TABS: 1 | 30 days supply | Qty: 90 | Fill #0

## 2018-12-17 MED FILL — HYDROCODON-APAP 10-325: 10-325 | 15 days supply | Qty: 90 | Fill #0

## 2018-12-26 ENCOUNTER — Other Ambulatory Visit: Payer: Self-pay | Admitting: Hematology and Oncology

## 2018-12-26 MED FILL — CYCLOBENZAPRINE HCL 10 MG T: 10 | 10 days supply | Qty: 30 | Fill #0

## 2018-12-27 ENCOUNTER — Other Ambulatory Visit: Payer: Self-pay | Admitting: Hematology and Oncology

## 2018-12-27 MED FILL — BUTALBITAL-APAP-CAFFEINE 50: 50-325-40 | 22 days supply | Qty: 90 | Fill #0

## 2019-01-01 ENCOUNTER — Other Ambulatory Visit: Payer: Self-pay | Admitting: Hematology and Oncology

## 2019-01-01 DIAGNOSIS — C719 Malignant neoplasm of brain, unspecified: Secondary | ICD-10-CM

## 2019-01-01 MED ORDER — HYDROCODONE-ACETAMINOPHEN 10-325 MG PO TABS: 1.0000 | ORAL_TABLET | ORAL | 0 refills | Status: DC | PRN

## 2019-01-01 MED FILL — HYDROCODON-APAP 10-325: 10-325 | 15 days supply | Qty: 90 | Fill #0

## 2019-01-08 MED FILL — LEVOCETIRIZINE 5 MG TABLET: 5 | 30 days supply | Qty: 30 | Fill #2

## 2019-01-08 MED FILL — CARISOPRODOL 350 MG TABS: 350 | 70 days supply | Qty: 280 | Fill #0

## 2019-01-14 ENCOUNTER — Other Ambulatory Visit: Payer: Self-pay | Admitting: Allergy and Immunology

## 2019-01-14 ENCOUNTER — Other Ambulatory Visit: Payer: Self-pay | Admitting: Hematology and Oncology

## 2019-01-14 DIAGNOSIS — C719 Malignant neoplasm of brain, unspecified: Secondary | ICD-10-CM

## 2019-01-14 MED FILL — DICLOFENAC SODIUM 1 % GEL: 1 | 30 days supply | Qty: 100 | Fill #0

## 2019-01-14 MED FILL — HYDROCODON-APAP 10-325: 10-325 | 15 days supply | Qty: 90 | Fill #0

## 2019-01-16 ENCOUNTER — Other Ambulatory Visit: Payer: Self-pay | Admitting: Hematology and Oncology

## 2019-01-17 MED FILL — BUTALBITAL-APAP-CAFFEINE 50: 50-325-40 | 23 days supply | Qty: 90 | Fill #0

## 2019-01-18 ENCOUNTER — Other Ambulatory Visit: Payer: Self-pay | Admitting: Allergy and Immunology

## 2019-01-28 ENCOUNTER — Telehealth: Payer: Self-pay | Admitting: *Deleted

## 2019-01-28 ENCOUNTER — Other Ambulatory Visit: Payer: Self-pay | Admitting: Hematology and Oncology

## 2019-01-28 DIAGNOSIS — C711 Malignant neoplasm of frontal lobe: Secondary | ICD-10-CM

## 2019-01-28 DIAGNOSIS — R569 Unspecified convulsions: Secondary | ICD-10-CM

## 2019-01-28 DIAGNOSIS — C719 Malignant neoplasm of brain, unspecified: Secondary | ICD-10-CM

## 2019-01-28 MED FILL — HYDROCODON-APAP 10-325: 10-325 | 15 days supply | Qty: 90 | Fill #0

## 2019-01-28 NOTE — Telephone Encounter (Signed)
MRI has been approved and scheduled for Wednesday at 730pm. Patient confirms appt.

## 2019-01-28 NOTE — Telephone Encounter (Signed)
I placed order to be done by Thursday Can you try to get prior auth and approval? If cannot be done by Friday, reschedule appt

## 2019-01-28 NOTE — Telephone Encounter (Signed)
Patient called to find out if she was suppose to have an MRI prior to her appt with Dr. Alvy Bimler on Friday. She has had a few falls recently. She blacked out in the bathroom and hit her face on the toilet paper holder. She did not seek medical attention. She reports 3 additional significant falls in the last month. She believes it has been related to balance or depth perception. Her blood pressure has been very good. She has been having more frequent headaches.   At this point she would like to have an MRI if Dr Alvy Bimler agrees.

## 2019-01-30 ENCOUNTER — Other Ambulatory Visit: Payer: Self-pay | Admitting: Hematology and Oncology

## 2019-01-30 ENCOUNTER — Other Ambulatory Visit: Payer: Self-pay

## 2019-01-30 ENCOUNTER — Encounter: Payer: Self-pay | Admitting: Hematology and Oncology

## 2019-01-30 ENCOUNTER — Ambulatory Visit (HOSPITAL_COMMUNITY)
Admission: RE | Admit: 2019-01-30 | Discharge: 2019-01-30 | Disposition: A | Discharge status: Home / Self Care | Payer: Medicare Other | Source: Ambulatory Visit | Attending: Hematology and Oncology | Admitting: Hematology and Oncology

## 2019-01-30 DIAGNOSIS — C711 Malignant neoplasm of frontal lobe: Secondary | ICD-10-CM

## 2019-01-30 DIAGNOSIS — R569 Unspecified convulsions: Secondary | ICD-10-CM

## 2019-01-30 DIAGNOSIS — D61818 Other pancytopenia: Secondary | ICD-10-CM

## 2019-01-30 MED ORDER — GADOBUTROL 1 MMOL/ML IV SOLN
7.0000 mL | Freq: Once | INTRAVENOUS | Status: AC | PRN
  Administered 2019-01-30: 7 mL via INTRAVENOUS

## 2019-01-31 ENCOUNTER — Encounter: Payer: Self-pay | Admitting: Hematology and Oncology

## 2019-01-31 ENCOUNTER — Inpatient Hospital Stay (HOSPITAL_BASED_OUTPATIENT_CLINIC_OR_DEPARTMENT_OTHER): Payer: Medicare Other | Admitting: Hematology and Oncology

## 2019-01-31 ENCOUNTER — Inpatient Hospital Stay: Payer: Medicare Other | Attending: Hematology and Oncology

## 2019-01-31 ENCOUNTER — Other Ambulatory Visit: Payer: Self-pay

## 2019-01-31 ENCOUNTER — Other Ambulatory Visit: Payer: Self-pay | Admitting: Hematology and Oncology

## 2019-01-31 DIAGNOSIS — G8929 Other chronic pain: Secondary | ICD-10-CM | POA: Insufficient documentation

## 2019-01-31 DIAGNOSIS — Z791 Long term (current) use of non-steroidal anti-inflammatories (NSAID): Secondary | ICD-10-CM

## 2019-01-31 DIAGNOSIS — C711 Malignant neoplasm of frontal lobe: Secondary | ICD-10-CM

## 2019-01-31 DIAGNOSIS — E559 Vitamin D deficiency, unspecified: Secondary | ICD-10-CM

## 2019-01-31 DIAGNOSIS — G40909 Epilepsy, unspecified, not intractable, without status epilepticus: Secondary | ICD-10-CM | POA: Insufficient documentation

## 2019-01-31 DIAGNOSIS — R51 Headache: Secondary | ICD-10-CM | POA: Diagnosis not present

## 2019-01-31 DIAGNOSIS — Z9221 Personal history of antineoplastic chemotherapy: Secondary | ICD-10-CM | POA: Insufficient documentation

## 2019-01-31 DIAGNOSIS — Z79899 Other long term (current) drug therapy: Secondary | ICD-10-CM | POA: Insufficient documentation

## 2019-01-31 DIAGNOSIS — D72819 Decreased white blood cell count, unspecified: Secondary | ICD-10-CM

## 2019-01-31 DIAGNOSIS — D61818 Other pancytopenia: Secondary | ICD-10-CM

## 2019-01-31 DIAGNOSIS — G44001 Cluster headache syndrome, unspecified, intractable: Secondary | ICD-10-CM

## 2019-01-31 DIAGNOSIS — E538 Deficiency of other specified B group vitamins: Secondary | ICD-10-CM

## 2019-01-31 DIAGNOSIS — G40109 Localization-related (focal) (partial) symptomatic epilepsy and epileptic syndromes with simple partial seizures, not intractable, without status epilepticus: Secondary | ICD-10-CM

## 2019-01-31 LAB — CBC WITH DIFFERENTIAL/PLATELET
Abs Immature Granulocytes: 0 10*3/uL (ref 0.00–0.07)
Basophils Absolute: 0 10*3/uL (ref 0.0–0.1)
Basophils Relative: 1 %
Eosinophils Absolute: 0.1 10*3/uL (ref 0.0–0.5)
Eosinophils Relative: 2 %
HCT: 37.6 % (ref 36.0–46.0)
Hemoglobin: 12.7 g/dL (ref 12.0–15.0)
Immature Granulocytes: 0 %
Lymphocytes Relative: 43 %
Lymphs Abs: 1.1 10*3/uL (ref 0.7–4.0)
MCH: 32.7 pg (ref 26.0–34.0)
MCHC: 33.8 g/dL (ref 30.0–36.0)
MCV: 96.9 fL (ref 80.0–100.0)
Monocytes Absolute: 0.2 10*3/uL (ref 0.1–1.0)
Monocytes Relative: 9 %
Neutro Abs: 1.2 10*3/uL — ABNORMAL LOW (ref 1.7–7.7)
Neutrophils Relative %: 45 %
Platelets: 230 10*3/uL (ref 150–400)
RBC: 3.88 MIL/uL (ref 3.87–5.11)
RDW: 11.9 % (ref 11.5–15.5)
WBC: 2.6 10*3/uL — ABNORMAL LOW (ref 4.0–10.5)
nRBC: 0 % (ref 0.0–0.2)

## 2019-01-31 LAB — VITAMIN B12: Vitamin B-12: 650 pg/mL (ref 180–914)

## 2019-01-31 NOTE — Assessment & Plan Note (Signed)
She has severe, chronic headaches that is intractable She is currently dependent on Fioricet and chronic pain medicine to manage her headache The patient is not interested for taper or medication changes I recommend close follow-up with neurologist for other ways to treat her headaches if possible

## 2019-01-31 NOTE — Progress Notes (Signed)
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 I have reviewed multiple imaging studies with the patient She has no signs of cancer recurrence/progression She is currently 5 years out from her surgery She is considered a long-term cancer survivor although she is still have risk of disease recurrence/progression in the future I plan to see her every 6 months and we will consider imaging study once a year However, if she develops any signs of new neurological deficits of weakness, we will consider repeating imaging sooner  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 could be induced by medication side effects She is not symptomatic Observe only  Vitamin D deficiency She has severe vitamin D deficiency I have checked vitamin D level, result pending We will call the patient with test results tomorrow  Vitamin B12 deficiency She has history of vitamin B12 deficiency Repeat B12 level is satisfactory She will continue oral B12 supplement  Simple partial seizures She had recurrent seizures as well as recent falls The patient noted some changes in depth perception and blurriness of vision She also have a tendency to veer towards the left with balance difficulties I recommend her to follow closely with neurologist for further management MRI did not show any evidence of disease recurrence/progression  Headache She has severe, chronic headaches that is  intractable She is currently dependent on Fioricet and chronic pain medicine to manage her headache The patient is not interested for taper or medication changes I recommend close follow-up with neurologist for other ways to treat her headaches if possible   No orders of the defined types were placed in this encounter.   INTERVAL HISTORY: Please see below for problem oriented charting. 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   09/27/2013 Pathology Results   Accession: [id number] pathology showed grade 2 oligodendroglioma with mutant IDH1 protein, ATRX, OLIG 2 expression. p53 is negative, low Ki-67 labeling index. Molecular SNP array studies demonstrate whole arm 1p19q codeletion."   10/28/2013 Imaging   Ct scan of head showed possible abscess.   11/06/2013 Imaging   Repeat CT scan of the head showed resolution of abscess.   01/23/2014 Surgery   Dr. Christella Noa perform a subtotal gross resection of the tumor.   01/24/2014 Imaging   Repeat imaging studies showed persistent and residual disease.   03/03/2014 - 09/01/2014 Chemotherapy   She was started on Temodar, 5 days every 28 days   03/13/2014 Imaging   Repeat MRI showed no evidence of progression of disease.   07/14/2014 Imaging   Repeat MRI showed no evidence of disease progression.   09/12/2014 Imaging   MRI of  the liver show mild regression of the size of lesions, presumed to be benign   09/15/2014 Imaging   Repeat MRI of the brain show  persistent abnormalities, suspicious for residual disease   03/02/2015 Imaging    MRI brain at [facility] show persistent abnormalities   08/10/2015 Imaging   Repeat MRI [facility] showed stable disease   08/24/2015 - 02/08/2016 Chemotherapy   She was started on Lomustine, Procarbazine and Vincristine chemotherapy   08/30/2015 Imaging   CT chest showed no evidence of aspiration pneumonia   11/18/2015 Imaging   MRI brain showed status post resection of right frontal lobe tumor without residual or recurrent tumor. 2. Otherwise negative MRI of the brain.    03/02/2016 Imaging   Stable MRI post right frontal lobe tumor resection. Negative for recurrent tumor   06/01/2016 Imaging   Stable MRI post right frontal lobe tumor resection. No recurrent tumor identified   08/19/2016 Imaging   Unchanged examination without tumor recurrence.   11/17/2016 Imaging   Stable right frontal resection cavity. Stable focal mass effect within the right middle frontal gyrus at the superior margin of the cavity which may represent nonenhancing neoplasm. No evidence for tumor progression. Otherwise stable MRI of the brain   12/16/2016 Imaging   MR brain: 1. No acute intracranial abnormality. 2. Unchanged appearance of right frontal lobe resection cavity and surrounding hyperintense T2 weighted signal.   02/04/2017 Imaging   Repeat MRI Normal pituitary  Stable postop changes of right frontal tumor resection   05/12/2017 Imaging   The left ventricular size is normal. There is normal left ventricular wall thickness.  Left ventricular systolic function is normal. LV ejection fraction = 60-65%.  Left ventricular filling pattern is normal. The left ventricular wall motion is normal. The right ventricle is normal in size and function. There is no significant valvular stenosis or regurgitation No pulmonary hypertension. IVC size was normal. There is no pericardial effusion. There is no comparison study available    05/13/2017 Imaging   1.No evidence of acute pulmonary thromboembolic disease. 2.There is an 8 mm right lower lobe groundglass nodule. Statistically, in this age group, this is most likely to reflect benign etiologies such as a tiny focus of infection or nonspecific inflammation. 4 mm solid right upper lobe lung nodule. A CT scan could be obtained in 12 months to further evaluate if the patient has any known risk factors for malignancy. 3.There is a 1.3 cm left adrenal nodule. This finding is incompletely characterized although and is slightly increased in size relative to the abdominal MRI from February 2016. Although this finding is most likely benign, recommend follow-up adrenal protocol CT in one year.   05/13/2017 Imaging   MRI brain 1.No acute intracranial abnormality. 2.Postsurgical changes of a right frontal craniotomy for resection of a reported oligodendroglioma. Hyperintense T2/FLAIR signal around the resection cavity and in the subcortical white matter of a gyrus along the superior margin of the cavity is unchanged from 01/05/2015. No evidence of disease progression.   12/15/2017 Imaging   MRI brain at [facility] 1.No acute intracranial abnormality. 2.Postsurgical changes of right frontal craniotomy for resection of reported oligodendroglioma. No evidence of disease progression   12/28/2017 Procedure   Successful right IJ vein Port-A-Cath explant.   04/24/2018 Imaging   Stable post treatment appearance, RIGHT frontal oligodendroglioma.  No posttraumatic sequelae are evident.   01/30/2019 Imaging   Stable appearance of treated right frontal oligodendroglioma. No change from 04/24/2018.     REVIEW OF  SYSTEMS:   Constitutional: Denies fevers, chills or abnormal weight loss Eyes: Denies blurriness of vision Ears, nose, mouth, throat, and face: Denies mucositis or sore throat Respiratory: Denies cough, dyspnea or wheezes Cardiovascular: Denies palpitation, chest  discomfort or lower extremity swelling Gastrointestinal:  Denies nausea, heartburn or change in bowel habits Skin: Denies abnormal skin rashes Lymphatics: Denies new lymphadenopathy or easy bruising Neurological:Denies numbness, tingling or new weaknesses Behavioral/Psych: Mood is stable, no new changes  All other systems were reviewed with the patient and are negative.  I have reviewed the past medical history, past surgical history, social history and family history with the patient and they are unchanged from previous note.  ALLERGIES:  is allergic to adhesive [tape]; latex; percocet [oxycodone-acetaminophen]; shellfish allergy; skin adhesives; soma [carisoprodol]; tramadol hcl; vancomycin; and zofran [ondansetron hcl].  MEDICATIONS:  Current Outpatient Medications  Medication Sig Dispense Refill  . butalbital-acetaminophen-caffeine (FIORICET) 50-325-40 MG tablet TAKE 1 TABLET BY MOUTH EVERY 6 HOURS AS NEEDED FOR HEADACHE 90 tablet 0  . carisoprodol (SOMA) 350 MG tablet TAKE 1 TABLET BY MOUTH 4 TIMES DAILY FOR SEIZURE PREVENTION 120 tablet 5  . Cyanocobalamin (VITAMIN B-12 PO) Take 5,000 Units daily by mouth.     . divalproex (DEPAKOTE ER) 250 MG 24 hr tablet Take 2 tabs in AM, 3 tabs in PM 450 tablet 3  . EPINEPHRINE 0.3 mg/0.3 mL IJ SOAJ injection INJECT 0.3 MLS (ONE SYRINGE) INTO THE MUSCLE ONCE FOR 1 DOSE AS NEEDED. (Patient taking differently: Inject 0.3 mg into the muscle once. ) 0.3 mL 0  . gabapentin (NEURONTIN) 300 MG capsule Take 4 capsules twice a day 720 capsule 3  . HYDROcodone-acetaminophen (NORCO) 10-325 MG tablet TAKE 1 TABLET BY MOUTH EVERY 4 HOURS AS NEEDED 90 tablet 0  . ibuprofen (ADVIL,MOTRIN) 600 MG tablet Take 1 tablet (600 mg total) by mouth every 6 (six) hours as needed. (Patient taking differently: Take 600 mg by mouth every 6 (six) hours as needed for headache, mild pain or moderate pain. ) 30 tablet 0  . levocetirizine (XYZAL) 5 MG tablet Take 1 tablet (5 mg  total) by mouth every evening. 30 tablet 5  . LORazepam (ATIVAN) 1 MG tablet Take 1 tablet (1 mg total) by mouth every 8 (eight) hours as needed for anxiety. 90 tablet 0  . montelukast (SINGULAIR) 10 MG tablet Take 1 tablet (10 mg total) by mouth at bedtime. 30 tablet 9  . ondansetron (ZOFRAN) 8 MG tablet Take 1 tablet (8 mg total) by mouth every 8 (eight) hours as needed for nausea or vomiting. 30 tablet 0  . promethazine (PHENERGAN) 25 MG tablet Take 25 mg every 6 (six) hours as needed by mouth for nausea or vomiting.    . Vitamin D, Ergocalciferol, (DRISDOL) 50000 units CAPS capsule Take 50,000 Units by mouth every 7 (seven) days.     No current facility-administered medications for this visit.     PHYSICAL EXAMINATION: ECOG PERFORMANCE STATUS: 1 - Symptomatic but completely ambulatory  Vitals:   01/31/19 1309  BP: (!) 105/55  Pulse: 92  Resp: 18  Temp: 98.7 F (37.1 C)  SpO2: 100%   Filed Weights   01/31/19 1309  Weight: 154 lb 9.6 oz (70.1 kg)    GENERAL:alert, no distress and comfortable NEURO: alert & oriented x 3 with fluent speech, no focal motor/sensory deficits  LABORATORY DATA:  I have reviewed the data as listed    Component Value Date/Time   NA 140 03/26/2018 2349  NA 140 06/15/2017 1207   K 3.6 03/26/2018 2349   K 3.9 06/15/2017 1207   CL 105 03/26/2018 2349   CO2 26 03/26/2018 2349   CO2 24 06/15/2017 1207   GLUCOSE 92 03/26/2018 2349   GLUCOSE 85 06/15/2017 1207   BUN 11 03/26/2018 2349   BUN 5.8 (L) 06/15/2017 1207   CREATININE 0.55 03/26/2018 2349   CREATININE 0.7 06/15/2017 1207   CALCIUM 9.1 03/26/2018 2349   CALCIUM 8.5 06/15/2017 1207   PROT 6.8 09/21/2017 1252   PROT 6.7 06/15/2017 1207   ALBUMIN 3.7 09/21/2017 1252   ALBUMIN 3.9 06/15/2017 1207   AST 17 09/21/2017 1252   AST 14 06/15/2017 1207   ALT 18 09/21/2017 1252   ALT 11 06/15/2017 1207   ALKPHOS 49 09/21/2017 1252   ALKPHOS 42 06/15/2017 1207   BILITOT 0.4 09/21/2017 1252    BILITOT 0.36 06/15/2017 1207   GFRNONAA >60 03/26/2018 2349   GFRAA >60 03/26/2018 2349    No results found for: SPEP, UPEP  Lab Results  Component Value Date   WBC 2.6 (L) 01/31/2019   NEUTROABS 1.2 (L) 01/31/2019   HGB 12.7 01/31/2019   HCT 37.6 01/31/2019   MCV 96.9 01/31/2019   PLT 230 01/31/2019      Chemistry      Component Value Date/Time   NA 140 03/26/2018 2349   NA 140 06/15/2017 1207   K 3.6 03/26/2018 2349   K 3.9 06/15/2017 1207   CL 105 03/26/2018 2349   CO2 26 03/26/2018 2349   CO2 24 06/15/2017 1207   BUN 11 03/26/2018 2349   BUN 5.8 (L) 06/15/2017 1207   CREATININE 0.55 03/26/2018 2349   CREATININE 0.7 06/15/2017 1207      Component Value Date/Time   CALCIUM 9.1 03/26/2018 2349   CALCIUM 8.5 06/15/2017 1207   ALKPHOS 49 09/21/2017 1252   ALKPHOS 42 06/15/2017 1207   AST 17 09/21/2017 1252   AST 14 06/15/2017 1207   ALT 18 09/21/2017 1252   ALT 11 06/15/2017 1207   BILITOT 0.4 09/21/2017 1252   BILITOT 0.36 06/15/2017 1207       RADIOGRAPHIC STUDIES: I have reviewed multiple imaging studies with the patient I have personally reviewed the radiological images as listed and agreed with the findings in the report. Mr Jeri Cos Wo Contrast  Result Date: 01/31/2019 CLINICAL DATA:  Neoplasm follow-up. Grade 2 oligodendroglioma resected in 2015. Subsequent chemotherapy. More recently there has been stable imaging. EXAM: MRI HEAD WITHOUT AND WITH CONTRAST TECHNIQUE: Multiplanar, multiecho pulse sequences of the brain and surrounding structures were obtained without and with intravenous contrast. CONTRAST:  7 cc Gadavist intravenous COMPARISON:  04/24/2018 FINDINGS: Brain: Right frontal resection cavity with stable adjacent FLAIR hyperintensity. No new signal abnormality or mass effect. No worrisome or changed enhancement at the resection site or elsewhere. No interval infarct, hemorrhage, hydrocephalus, or collection. Vascular: Major flow voids and vascular  enhancements are preserved Skull and upper cervical spine: Unremarkable right-sided craniotomy site. Sinuses/Orbits: Negative IMPRESSION: Stable appearance of treated right frontal oligodendroglioma. No change from 04/24/2018. Electronically Signed   By: Monte Fantasia M.D.   On: 01/31/2019 11:46    All questions were answered. The patient knows to call the clinic with any problems, questions or concerns. No barriers to learning was detected.  I spent 25 minutes counseling the patient face to face. The total time spent in the appointment was 30 minutes and more than 50% was on counseling and  review of test results  Heath Lark, MD 01/31/2019 3:21 PM

## 2019-01-31 NOTE — Assessment & Plan Note (Signed)
I have reviewed multiple imaging studies with the patient She has no signs of cancer recurrence/progression She is currently 5 years out from her surgery She is considered a long-term cancer survivor although she is still have risk of disease recurrence/progression in the future I plan to see her every 6 months and we will consider imaging study once a year However, if she develops any signs of new neurological deficits of weakness, we will consider repeating imaging sooner

## 2019-01-31 NOTE — Assessment & Plan Note (Signed)
She had recurrent seizures as well as recent falls The patient noted some changes in depth perception and blurriness of vision She also have a tendency to veer towards the left with balance difficulties I recommend her to follow closely with neurologist for further management MRI did not show any evidence of disease recurrence/progression

## 2019-01-31 NOTE — Assessment & Plan Note (Signed)
She has chronic leukopenia could be induced by medication side effects She is not symptomatic Observe only

## 2019-01-31 NOTE — Assessment & Plan Note (Signed)
She has history of vitamin B12 deficiency Repeat B12 level is satisfactory She will continue oral B12 supplement

## 2019-01-31 NOTE — Assessment & Plan Note (Signed)
She has severe vitamin D deficiency I have checked vitamin D level, result pending We will call the patient with test results tomorrow

## 2019-02-01 ENCOUNTER — Ambulatory Visit: Payer: Medicare Other | Admitting: Hematology and Oncology

## 2019-02-01 ENCOUNTER — Telehealth: Payer: Self-pay

## 2019-02-01 ENCOUNTER — Inpatient Hospital Stay: Payer: Medicare Other

## 2019-02-01 ENCOUNTER — Other Ambulatory Visit: Payer: Self-pay | Admitting: Allergy and Immunology

## 2019-02-01 ENCOUNTER — Telehealth: Payer: Self-pay | Admitting: Hematology and Oncology

## 2019-02-01 LAB — VITAMIN D 25 HYDROXY (VIT D DEFICIENCY, FRACTURES): Vit D, 25-Hydroxy: 34.3 ng/mL (ref 30.0–100.0)

## 2019-02-01 NOTE — Telephone Encounter (Signed)
I talk with patient regarding schedule  

## 2019-02-01 NOTE — Telephone Encounter (Signed)
-----   Message from Heath Lark, MD sent at 02/01/2019  7:28 AM EDT ----- Regarding: vitamin B12 and Vitamin D levels are normal, pls call

## 2019-02-01 NOTE — Telephone Encounter (Signed)
Called and given below message. 

## 2019-02-04 ENCOUNTER — Other Ambulatory Visit: Payer: Self-pay | Admitting: Hematology and Oncology

## 2019-02-04 ENCOUNTER — Telehealth: Payer: Self-pay | Admitting: *Deleted

## 2019-02-04 MED ORDER — BUTALBITAL-APAP-CAFFEINE 50-325-40 MG PO TABS: ORAL_TABLET | ORAL | 0 refills | Status: DC

## 2019-02-04 NOTE — Telephone Encounter (Signed)
Patient calling to request a refill of Fioricet please.

## 2019-02-04 NOTE — Telephone Encounter (Signed)
done

## 2019-02-05 ENCOUNTER — Other Ambulatory Visit: Payer: Self-pay | Admitting: Hematology and Oncology

## 2019-02-06 ENCOUNTER — Other Ambulatory Visit: Payer: Self-pay | Admitting: Hematology and Oncology

## 2019-02-06 MED ORDER — LORAZEPAM 1 MG PO TABS: 1.0000 mg | ORAL_TABLET | Freq: Three times a day (TID) | ORAL | 0 refills | Status: DC | PRN

## 2019-02-06 MED FILL — LORazepam 1 MG TABS: 1 | 30 days supply | Qty: 90 | Fill #0

## 2019-02-07 MED FILL — BUTALBITAL-APAP-CAFFEINE 50: 50-325-40 | 23 days supply | Qty: 90 | Fill #0

## 2019-02-08 ENCOUNTER — Other Ambulatory Visit: Payer: Self-pay | Admitting: Hematology and Oncology

## 2019-02-08 DIAGNOSIS — C719 Malignant neoplasm of brain, unspecified: Secondary | ICD-10-CM

## 2019-02-11 ENCOUNTER — Telehealth: Payer: Self-pay | Admitting: *Deleted

## 2019-02-11 ENCOUNTER — Other Ambulatory Visit: Payer: Self-pay | Admitting: Hematology and Oncology

## 2019-02-11 DIAGNOSIS — C719 Malignant neoplasm of brain, unspecified: Secondary | ICD-10-CM

## 2019-02-11 NOTE — Telephone Encounter (Signed)
[name] called requesting a refill on Hydrocodone.

## 2019-02-12 MED FILL — HYDROCODON-APAP 10-325: 10-325 | 15 days supply | Qty: 90 | Fill #0

## 2019-02-12 NOTE — Telephone Encounter (Signed)
done

## 2019-02-12 NOTE — Telephone Encounter (Signed)
Patient called- message left refill was sent

## 2019-02-19 ENCOUNTER — Other Ambulatory Visit: Payer: Self-pay | Admitting: Allergy and Immunology

## 2019-02-22 ENCOUNTER — Encounter: Payer: Self-pay | Admitting: Neurology

## 2019-02-22 ENCOUNTER — Ambulatory Visit (INDEPENDENT_AMBULATORY_CARE_PROVIDER_SITE_OTHER): Payer: Medicare Other | Admitting: Neurology

## 2019-02-22 ENCOUNTER — Other Ambulatory Visit: Payer: Self-pay

## 2019-02-22 VITALS — BP 115/81 | HR 94 | Ht 68.0 in | Wt 152.0 lb

## 2019-02-22 DIAGNOSIS — G40109 Localization-related (focal) (partial) symptomatic epilepsy and epileptic syndromes with simple partial seizures, not intractable, without status epilepticus: Secondary | ICD-10-CM | POA: Diagnosis not present

## 2019-02-22 DIAGNOSIS — G43709 Chronic migraine without aura, not intractable, without status migrainosus: Secondary | ICD-10-CM | POA: Diagnosis not present

## 2019-02-22 DIAGNOSIS — IMO0002 Reserved for concepts with insufficient information to code with codable children: Secondary | ICD-10-CM

## 2019-02-22 MED ORDER — DIVALPROEX SODIUM ER 250 MG PO TB24: ORAL_TABLET | ORAL | 3 refills | Status: DC

## 2019-02-22 MED ORDER — GABAPENTIN 300 MG PO CAPS: ORAL_CAPSULE | ORAL | 3 refills | Status: DC

## 2019-02-22 MED ORDER — ELETRIPTAN HYDROBROMIDE 20 MG PO TABS: ORAL_TABLET | ORAL | 5 refills | Status: DC

## 2019-02-22 MED FILL — GABAPENTIN 300 MG CAPSULE: 300 | 90 days supply | Qty: 720 | Fill #0

## 2019-02-22 MED FILL — ELETRIPTAN HBR 20 MG TABLET: 20 | 28 days supply | Qty: 10 | Fill #0

## 2019-02-22 MED FILL — DIVALPROEX SOD ER 250 MG TA: 250 | 90 days supply | Qty: 450 | Fill #0

## 2019-02-22 NOTE — Progress Notes (Signed)
NEUROLOGY FOLLOW UP OFFICE NOTE  Victoria Bates [id number]  DOB: Mar 21, 1981  HISTORY OF PRESENT ILLNESS: I had the pleasure of seeing Victoria Bates in follow-up in the neurology clinic on 02/22/2019. She is alone in the office today. The patient was last seen 7 months ago for seizures. She has a history of a right frontal oligodendroglioma s/p subtotal resection. Most recent MRI in July 2020 again showed stable appearance of treated right frontal oligodendroglioma. She continues to report seizures but states these are mild, she had a fall with one of the seizures at the beginning of July. She is taking Depakote 250mg  in AM, 750mg  in PM and gabapentin 300mg  4 tabs in AM, 3 tabs in PM. She feels stable on these medications and does not want to change doses. She is asking about Botox for chronic daily headaches. She takes aspirin, Fioricet, and hydrocodone on a daily basis. She recalls having Botox in 2016 which really helped with her headaches at that time but were costly. She has cyclobenzaprine for muscle spasms which helps at night, it also helps her sleep. She has been dealing with back pain and has had epidural injections, last injection was in February, which did help. She has had several other issues that she would like to keep an eye on for now and discuss on a later visit.  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 She was admitted to [facility] last 09/19/2013 due to left vision changes and left facial heaviness. She reports that she had a 60-lb unintentional weight loss 4 months leading up to her admission, she was vomiting after every meal. She started having episodes where her hands would have low amplitude tremors and she feels the right lower face twitching. She would need to pause and take a minute to get words out. If she did not stop what she was doing, the  symptoms could last for hours. She would feel like her body was "going 90 mph" with palpitations up to 200 bpm. She was started on Xanax for anxiety, but this did not help. She then started having vision changes on her left eye, she would park crookedly when she thought she was going straight. She saw an ophthalmologist, eye exam was normal but left optic neuritis was questioned and brain imaging recommended. She then started to have left facial heaviness and numbness on the left face, arm, and leg, and drove herself to [place] where she was found to have a right frontal lesion. She underwent subtotal resection on 01/23/14 by Dr. Franky Macho and was started on Keppra for seizure prophylaxis at that time. She continued to have the recurrent episodes of tremors in both arms, palpitations, and right facial tightness, as well as episodes of left face, neck, shoulder, and arm numbness, going down the left thumb and index finger. She describes her major seizures as "all of that times 100." After the episodes, she would be tired for a few days, wanting to sleep, with no energy. She has noticed that her short term memory is "gone." Keppra dose had been increased up to 2 grams daily, and this had caused a change in personality. She had seen neurologist Dr. Pearlean Brownie, who switched her to Depakote. She tapered off Keppra and is currently on Depakote ER 500mg  BID with significant improvement in symptoms. She denies any side effects to the Depakote.   She  has a history of recurrent headaches since her teenage years. Headaches are usually over the right temporal and occipital region, with sharp stabbing and throbbing pain, with a knot in her left neck region. There is occasional nausea, photo, and phonophobia. She was having these headaches around 1-2 times a week when younger, however since December 2014, headaches have been occurring around 2-3 times a week. She had been receiving injections in her neck for this. If she takes medication  at the onset, they last a few hours, however they may last up to several days. She has found that taking Decadron for a couple of days has been the only helpful medication. She takes Fioricet rarely. Zomig, aspirin, and Tylenol were ineffective in the past. Her father and 11 year old son have headaches. She denies any diplopia, dysarthria, dysphagia, bowel/bladder dysfunction. She has chronic back pain and had been taking Soma qid for several years, with worsening of pain when she tried reducing this. She was started on Gabapentin for the seizures and back pain, she is unsure if this helps. She had been taking Ultram for the pain, and once stopping this, she noticed an improvement in the seizures as well.   Diagnostic Data: Routine EEG done at [facility] reported amplitude asymmetry in the right frontotemporal regions which is likely from breach artifact from prior craniotomy. There is intermittent right frontal sharp activity but no definite epileptiform activity. Impression: Mild right hemispheric focal cortical irritability but no definitive epileptiform activity is noted.  Her 48-hour EEG did not show any epileptiform discharges. It showed breach artifact consistent with prior surgery. She reported numerous symptoms including headache, severe muscle spasm under the base of her skull on the right side, tremors in both hands, jerking and pause in jaw (right side), left leg/inner thigh and left foot warm and pigmented, eyes twitching, fatigue, with no associated epileptiform correlate seen. Inpatient video EEG monitoring for 3 days at [facility], June 23 to 25, 2016: Depakote and Gabapentin were held. Typical events were not clearly captured, however she reported 2 spells where she reported dizziness and visual changes (tiles on floor looked uneven) with no EEG correlate. Baseline EEG showed breach rhythm over the right hemisphere, no epileptiform discharges or electrographic seizures.  I personally reviewed MRI brain  with and without contrast done 03/13/14 which showed post-op right-sided craniotomy. The surgical cavity in the right frontal lobe is filled with fluid, with note of improvement in layering blood products since prior study. The study of 01/24/2014 showed restricted diffusion deep to the tumor. This is consistent with acute infarct which is resolving at this time. No acute infarct is present on today's study. No evidence of recurrent or residual tumor although the original tumor did not enhance. No mass effect or edema is present that would suggest recurrent tumor. Small amount of subarachnoid hemorrhage on the right again noted. MRI 11/2015 which I personally reviewed, there is similar pattern of enhancement and abnormal FLAIR signal in the right frontal gyrus immediately superior to the resection, stable from previous scan.   Epilepsy Risk Factors: Right frontal oligodendroglioma s/p subtotal resection. Otherwise she had a normal birth and early development. There is no history of febrile convulsions, CNS infections such as meningitis/encephalitis, or family history of seizures.  PAST MEDICAL HISTORY: Past Medical History:  Diagnosis Date  . Adrenal tumor   . Anemia   . Anxiety   . Asthma    very mild- no inhaler use since July 2013  . Brain tumor (  HCC)    Hx: of  . Cancer (HCC) 2003   skin  . Chronic fatigue 10/03/2014  . Cognitive impairment 08/13/2015  . Complication of anesthesia    takes longer for patient to go under anesthesia  . Dizziness   . Dysphasia 02/12/2014  . Glioma, malignant (HCC) dx'd 09/2013  . Headache 02/12/2014  . Headache(784.0)    migraines  . Herniated disc   . Hot flashes 10/03/2014  . Infection    to right head incision  . 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 Liver hemangioma   . Liver lesion 08/22/2014  . Nausea alone 02/12/2014  . Neuropathic pain 04/08/2014  . Petit mal seizure status (HCC)   . Seizures (HCC)    petit mal serizures most days    MEDICATIONS:   Outpatient Encounter Medications as of 02/22/2019  Medication Sig  . butalbital-acetaminophen-caffeine (FIORICET) 50-325-40 MG tablet TAKE 1 TABLET BY MOUTH EVERY 6 HOURS AS NEEDED FOR HEADACHE  . carisoprodol (SOMA) 350 MG tablet TAKE 1 TABLET BY MOUTH 4 TIMES DAILY FOR SEIZURE PREVENTION  . divalproex (DEPAKOTE ER) 250 MG 24 hr tablet Take 2 tabs in AM, 3 tabs in PM  . EPINEPHRINE 0.3 mg/0.3 mL IJ SOAJ injection INJECT 0.3 MLS (ONE SYRINGE) INTO THE MUSCLE ONCE FOR 1 DOSE AS NEEDED. (Patient taking differently: Inject 0.3 mg into the muscle once. )  . gabapentin (NEURONTIN) 300 MG capsule Take 4 capsules twice a day  . HYDROcodone-acetaminophen (NORCO) 10-325 MG tablet TAKE 1 TABLET BY MOUTH EVERY 4 HOURS AS NEEDED  . levocetirizine (XYZAL) 5 MG tablet Take 1 tablet (5 mg total) by mouth every evening.  Marland Kitchen LORazepam (ATIVAN) 1 MG tablet Take 1 tablet (1 mg total) by mouth every 8 (eight) hours as needed for anxiety.  . montelukast (SINGULAIR) 10 MG tablet Take 1 tablet (10 mg total) by mouth at bedtime.  . promethazine (PHENERGAN) 25 MG tablet Take 25 mg every 6 (six) hours as needed by mouth for nausea or vomiting.  . Cyanocobalamin (VITAMIN B-12 PO) Take 5,000 Units daily by mouth.   Marland Kitchen ibuprofen (ADVIL,MOTRIN) 600 MG tablet Take 1 tablet (600 mg total) by mouth every 6 (six) hours as needed. (Patient taking differently: Take 600 mg by mouth every 6 (six) hours as needed for headache, mild pain or moderate pain. )  . ondansetron (ZOFRAN) 8 MG tablet Take 1 tablet (8 mg total) by mouth every 8 (eight) hours as needed for nausea or vomiting.  . Vitamin D, Ergocalciferol, (DRISDOL) 50000 units CAPS capsule Take 50,000 Units by mouth every 7 (seven) days.   No facility-administered encounter medications on file as of 02/22/2019.      ALLERGIES: Allergies  Allergen Reactions  . Adhesive [Tape] Other (See Comments)    Skin tear  . Latex Hives  . Percocet [Oxycodone-Acetaminophen] Other (See  Comments)    makes pt angry  . Shellfish Allergy Diarrhea and Nausea And Vomiting  . Skin Adhesives     Per patient states reaction to dermabond when had hysterectomy. Stated was red and irritated.   Tresa Garter [Carisoprodol]     Watson brand, specifically, causes disorientation (can take other brands)  . Tramadol Hcl Other (See Comments)    Potentially worsen seizure control  . Vancomycin Hives  . Zofran [Ondansetron Hcl]     Pt. Stated side effects : abdominal pain / constipation     FAMILY HISTORY: Family History  Problem Relation Age of Onset  . Hypertension Mother   .  Skin cancer Mother   . Thyroid cancer Father   . Skin cancer Father   . Breast cancer Maternal Aunt        dx in her 14s  . Prostate cancer Maternal Uncle 63  . Melanoma Maternal Uncle   . Ovarian cancer Maternal Grandmother        dx in her late 63s-40s  . Breast cancer Maternal Aunt        bilateral breast cancer dx in 30s and 36s; possibly BRCA+  . Other Son        multiple CAL spots, bright spots on brain MRI, ? chiari malformation    SOCIAL HISTORY: Social History   Socioeconomic History  . Marital status: Significant Other    Spouse name: Not on file  . Number of children: 2  . Years of education: College  . Highest education level: Not on file  Occupational History  . Occupation: Press photographer: [employer]  Social Needs  . Financial resource strain: Not on file  . Food insecurity    Worry: Not on file    Inability: Not on file  . Transportation needs    Medical: Not on file    Non-medical: Not on file  Tobacco Use  . Smoking status: Never Smoker  . Smokeless tobacco: Never Used  Substance and Sexual Activity  . Alcohol use: No    Alcohol/week: 0.0 standard drinks  . Drug use: No  . Sexual activity: Yes    Partners: Male    Birth control/protection: Surgical  Lifestyle  . Physical activity    Days per week: Not on file    Minutes per session: Not on file  .  Stress: Not on file  Relationships  . Social Musician on phone: Not on file    Gets together: Not on file    Attends religious service: Not on file    Active member of club or organization: Not on file    Attends meetings of clubs or organizations: Not on file    Relationship status: Not on file  . Intimate partner violence    Fear of current or ex partner: Not on file    Emotionally abused: Not on file    Physically abused: Not on file    Forced sexual activity: Not on file  Other Topics Concern  . Not on file  Social History Narrative   Patient lives at home with her two children.   Caffeine use: minimum of 4 cups daily    REVIEW OF SYSTEMS: Constitutional: No fevers, chills, or sweats, generalized fatigue, change in appetite Eyes: No visual changes, double vision, eye pain Ear, nose and throat: No hearing loss, ear pain, nasal congestion, sore throat Cardiovascular: No chest pain, palpitations Respiratory:  No shortness of breath at rest or with exertion, wheezes GastrointestinaI: No nausea, vomiting, diarrhea, abdominal pain, fecal incontinence Genitourinary:  No dysuria, urinary retention or frequency Musculoskeletal:  + neck pain, back pain Integumentary: No rash, pruritus, skin lesions Neurological: as above Psychiatric: No depression, insomnia, +anxiety Endocrine: No palpitations, fatigue, diaphoresis, mood swings, change in appetite, change in weight, increased thirst Hematologic/Lymphatic:  No anemia, purpura, petechiae. Allergic/Immunologic: no itchy/runny eyes, nasal congestion, recent allergic reactions, rashes  PHYSICAL EXAM: Vitals:   02/22/19 1538  BP: 115/81  Pulse: 94  SpO2: 96%   General: No acute distress Head:  Normocephalic/atraumatic Skin/Extremities: No rash, no edema Neurological Exam: alert and oriented to person, place,  and time. No aphasia or dysarthria. Fund of knowledge is appropriate.  Recent and remote memory are intact.   Attention and concentration are normal.   Cranial nerves: Pupils equal, round. EOMs intact without nystagmus. No facial asymmetry. Tongue, uvula, palate midline.  Motor: 5/5 throughout. No incoordination on finger to nose testing. Gait narrow-based and steady, able to tandem walk adequately.  IMPRESSION: This is a 38 yo RH woman with a history of grade 2 oligodengroglioma s/p subtotal resection on chemotherapy, anxiety, headaches, with recurrent episodes of low amplitude tremors, palpitations, and right facial tightening, as well as left face and arm numbness. She had a 48-hour EEG which did not show any epileptiform discharges. Her 3-day video EEG [facility] showed breach artifact, otherwise no epileptiform discharges. She did not have typical events and hence these could not be commented on. She was reporting an increase in seizures, and had another 72-hour EEG which did not show any epileptiform discharges, she had multiple push button events for dizziness, stuttering, headache, word-finding difficulties, with no EEG correlate. At times her heart rate went up to 150bpm during these events. 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 She continues to report seizures but does not want to change medications, continue Depakote ER 250mg  2 tabs in AM, 3 tabs in PM and Gabapentin 300mg  4 tabs in AM, 3 tabs in PM. She reports daily headaches needing hydrocodone and fioricet regularly, likely with medication overuse headaches as well. She would benefit from Botox which she had good response to in the past. She is aware of [place] driving laws to stop driving after a seizure until 6 months seizure-free. Follow-up in 3-4 months, she knows to call for any changes.   Thank you for allowing me to participate in her care.  Please do not hesitate to call for any questions or concerns.   Patrcia Dolly, M.D.   CC: Dr. Bertis Ruddy, Dr. Eldridge Dace

## 2019-02-22 NOTE — Patient Instructions (Signed)
1. Continue all your medications 2. Try Relpax as needed for migraine. Do not take more than 2-3 a week 3. We will get Botox prior authorization initiated 4. Follow-up in 3-4 months, call for any changes  Seizure Precautions: 1. If medication has been prescribed for you to prevent seizures, take it exactly as directed.  Do not stop taking the medicine without talking to your doctor first, even if you have not had a seizure in a long time.   2. Avoid activities in which a seizure would cause danger to yourself or to others.  Don't operate dangerous machinery, swim alone, or climb in high or dangerous places, such as on ladders, roofs, or girders.  Do not drive unless your doctor says you may.  3. If you have any warning that you may have a seizure, lay down in a safe place where you can't hurt yourself.    4.  No driving for 6 months from last seizure, as per [facility].   Please refer to the following link on the [place] website for more information: http://[email or web address].org/answerplace/Social/driving/drivingu.cfm   5.  Maintain good sleep hygiene. Avoid alcohol.  6.  Notify your neurology if you are planning pregnancy or if you become pregnant.  7.  Contact your doctor if you have any problems that may be related to the medicine you are taking.  8.  Call 911 and bring the patient back to the ED if:        A.  The seizure lasts longer than 5 minutes.       B.  The patient doesn't awaken shortly after the seizure  C.  The patient has new problems such as difficulty seeing, speaking or moving  D.  The patient was injured during the seizure  E.  The patient has a temperature over 102 F (39C)  F.  The patient vomited and now is having trouble breathing

## 2019-02-25 ENCOUNTER — Telehealth: Payer: Self-pay | Admitting: *Deleted

## 2019-02-25 ENCOUNTER — Other Ambulatory Visit: Payer: Self-pay | Admitting: Hematology and Oncology

## 2019-02-25 DIAGNOSIS — C719 Malignant neoplasm of brain, unspecified: Secondary | ICD-10-CM

## 2019-02-25 MED FILL — BUTALBITAL-APAP-CAFFEINE 50: 50-325-40 | 23 days supply | Qty: 90 | Fill #0

## 2019-02-25 MED FILL — HYDROCODON-APAP 10-325: 10-325 | 15 days supply | Qty: 90 | Fill #0

## 2019-02-25 NOTE — Telephone Encounter (Signed)
Patient called for refill of Hydrocodone and Fioricet please to [place].

## 2019-02-25 NOTE — Telephone Encounter (Signed)
done

## 2019-02-25 NOTE — Telephone Encounter (Signed)
Returned call to patient to advise.

## 2019-03-06 ENCOUNTER — Ambulatory Visit (INDEPENDENT_AMBULATORY_CARE_PROVIDER_SITE_OTHER)
Admission: RE | Admit: 2019-03-06 | Discharge: 2019-03-06 | Disposition: A | Discharge status: Home / Self Care | Payer: Medicare Other | Source: Ambulatory Visit | Attending: Pulmonary Disease | Admitting: Pulmonary Disease

## 2019-03-06 ENCOUNTER — Other Ambulatory Visit: Payer: Self-pay

## 2019-03-06 DIAGNOSIS — R911 Solitary pulmonary nodule: Secondary | ICD-10-CM | POA: Diagnosis not present

## 2019-03-06 DIAGNOSIS — E279 Disorder of adrenal gland, unspecified: Secondary | ICD-10-CM | POA: Diagnosis not present

## 2019-03-11 ENCOUNTER — Other Ambulatory Visit: Payer: Self-pay | Admitting: Hematology and Oncology

## 2019-03-11 ENCOUNTER — Telehealth: Payer: Self-pay

## 2019-03-11 DIAGNOSIS — C719 Malignant neoplasm of brain, unspecified: Secondary | ICD-10-CM

## 2019-03-11 MED ORDER — HYDROCODONE-ACETAMINOPHEN 10-325 MG PO TABS: 1.0000 | ORAL_TABLET | ORAL | 0 refills | Status: DC | PRN

## 2019-03-11 MED FILL — HYDROCODON-APAP 10-325: 10-325 | 15 days supply | Qty: 90 | Fill #0

## 2019-03-11 NOTE — Telephone Encounter (Signed)
done

## 2019-03-11 NOTE — Telephone Encounter (Signed)
[name] called and requested a refill on Hydrocodone Rx.

## 2019-03-18 MED FILL — CARISOPRODOL 350 MG TABS: 350 | 90 days supply | Qty: 360 | Fill #1

## 2019-03-25 ENCOUNTER — Other Ambulatory Visit: Payer: Self-pay | Admitting: Hematology and Oncology

## 2019-03-25 DIAGNOSIS — C719 Malignant neoplasm of brain, unspecified: Secondary | ICD-10-CM

## 2019-03-26 ENCOUNTER — Other Ambulatory Visit: Payer: Self-pay | Admitting: Hematology and Oncology

## 2019-03-26 ENCOUNTER — Telehealth: Payer: Self-pay

## 2019-03-26 DIAGNOSIS — C719 Malignant neoplasm of brain, unspecified: Secondary | ICD-10-CM

## 2019-03-26 MED ORDER — BUTALBITAL-APAP-CAFFEINE 50-325-40 MG PO TABS: 1.0000 | ORAL_TABLET | Freq: Four times a day (QID) | ORAL | 0 refills | Status: DC | PRN

## 2019-03-26 MED FILL — BUTALBITAL-APAP-CAFFEINE 50: 50-325-40 | 22 days supply | Qty: 90 | Fill #0

## 2019-03-26 MED FILL — HYDROCODON-APAP 10-325: 10-325 | 15 days supply | Qty: 90 | Fill #0

## 2019-03-26 NOTE — Telephone Encounter (Signed)
done

## 2019-03-26 NOTE — Telephone Encounter (Signed)
Called and left a message Rx's sent to pharmacy.

## 2019-03-26 NOTE — Telephone Encounter (Signed)
Called requesting a refill on Fioricet.

## 2019-04-08 ENCOUNTER — Other Ambulatory Visit: Payer: Self-pay | Admitting: Hematology and Oncology

## 2019-04-08 DIAGNOSIS — C719 Malignant neoplasm of brain, unspecified: Secondary | ICD-10-CM

## 2019-04-08 MED ORDER — LORAZEPAM 1 MG PO TABS: 1.0000 mg | ORAL_TABLET | Freq: Three times a day (TID) | ORAL | 0 refills | Status: DC | PRN

## 2019-04-08 MED ORDER — HYDROCODONE-ACETAMINOPHEN 10-325 MG PO TABS: 1.0000 | ORAL_TABLET | ORAL | 0 refills | Status: DC | PRN

## 2019-04-08 MED FILL — HYDROCODON-APAP 10-325: 10-325 | 15 days supply | Qty: 90 | Fill #0

## 2019-04-08 MED FILL — LORazepam 1 MG TABS: 1 | 30 days supply | Qty: 90 | Fill #0

## 2019-04-15 ENCOUNTER — Other Ambulatory Visit: Payer: Self-pay | Admitting: Hematology and Oncology

## 2019-04-15 ENCOUNTER — Telehealth: Payer: Self-pay | Admitting: *Deleted

## 2019-04-15 MED ORDER — BUTALBITAL-APAP-CAFFEINE 50-325-40 MG PO TABS: 1.0000 | ORAL_TABLET | Freq: Four times a day (QID) | ORAL | 0 refills | Status: DC | PRN

## 2019-04-15 MED FILL — BUTALBITAL-APAP-CAFFEINE 50: 50-325-40 | 22 days supply | Qty: 90 | Fill #0

## 2019-04-15 NOTE — Telephone Encounter (Signed)
Patient calling for a refill on Fioricet to [facility] please.

## 2019-04-15 NOTE — Telephone Encounter (Signed)
done

## 2019-04-22 ENCOUNTER — Other Ambulatory Visit: Payer: Self-pay | Admitting: Hematology and Oncology

## 2019-04-22 ENCOUNTER — Telehealth: Payer: Self-pay

## 2019-04-22 DIAGNOSIS — C719 Malignant neoplasm of brain, unspecified: Secondary | ICD-10-CM

## 2019-04-22 MED FILL — HYDROCODON-APAP 10-325: 10-325 | 15 days supply | Qty: 90 | Fill #0

## 2019-04-22 NOTE — Telephone Encounter (Signed)
Can you call Victoria Bates and ask if she needs any other refills? I received request for Hydrocodone

## 2019-04-22 NOTE — Telephone Encounter (Signed)
[name] called and left a message requesting Hydrocodone Rx be sent to [facility].

## 2019-04-22 NOTE — Telephone Encounter (Signed)
Done

## 2019-04-22 NOTE — Telephone Encounter (Signed)
Called and ask Victoria Bates if Victoria Bates needs any other Rx refills. He said that is the only one that she needs.

## 2019-05-03 ENCOUNTER — Telehealth: Payer: Self-pay | Admitting: *Deleted

## 2019-05-03 ENCOUNTER — Other Ambulatory Visit: Payer: Self-pay | Admitting: Hematology and Oncology

## 2019-05-03 MED ORDER — BUTALBITAL-APAP-CAFFEINE 50-325-40 MG PO TABS: ORAL_TABLET | ORAL | 0 refills | Status: DC

## 2019-05-03 MED FILL — BUTALB-ACETAMIN-CAFF 50-325: 50-325-40 | 22 days supply | Qty: 90 | Fill #0

## 2019-05-03 NOTE — Telephone Encounter (Signed)
Patient's husband calling for a refill of Fioricet to [facility] please

## 2019-05-03 NOTE — Telephone Encounter (Signed)
done

## 2019-05-06 ENCOUNTER — Telehealth: Payer: Self-pay

## 2019-05-06 ENCOUNTER — Other Ambulatory Visit: Payer: Self-pay | Admitting: Hematology and Oncology

## 2019-05-06 DIAGNOSIS — C719 Malignant neoplasm of brain, unspecified: Secondary | ICD-10-CM

## 2019-05-06 MED FILL — HYDROCODON-APAP 10-325: 10-325 | 15 days supply | Qty: 90 | Fill #0

## 2019-05-06 NOTE — Telephone Encounter (Signed)
Done

## 2019-05-06 NOTE — Telephone Encounter (Signed)
Refill request for Hydrocodone sent to [facility].

## 2019-05-17 MED FILL — LEVOCETIRIZINE 5 MG TABLET: 5 | 30 days supply | Qty: 30 | Fill #3

## 2019-05-17 MED FILL — ELETRIPTAN HBR 20 MG TABLET: 20 | 28 days supply | Qty: 10 | Fill #1

## 2019-05-17 MED FILL — GABAPENTIN 300 MG CAPSULE: 300 | 90 days supply | Qty: 720 | Fill #1

## 2019-05-17 MED FILL — DIVALPROEX SOD ER 250 MG TA: 250 | 90 days supply | Qty: 450 | Fill #1

## 2019-05-20 ENCOUNTER — Other Ambulatory Visit: Payer: Self-pay | Admitting: Hematology and Oncology

## 2019-05-20 ENCOUNTER — Telehealth: Payer: Self-pay

## 2019-05-20 ENCOUNTER — Other Ambulatory Visit: Payer: Self-pay | Admitting: Allergy and Immunology

## 2019-05-20 DIAGNOSIS — C719 Malignant neoplasm of brain, unspecified: Secondary | ICD-10-CM

## 2019-05-20 MED FILL — HYDROCODON-APAP 10-325: 10-325 | 15 days supply | Qty: 90 | Fill #0

## 2019-05-20 MED FILL — BUTALB-ACETAMIN-CAFF 50-325: 50-325-40 | 22 days supply | Qty: 90 | Fill #0

## 2019-05-20 MED FILL — LORazepam 1 MG TABS: 1 | 30 days supply | Qty: 90 | Fill #0

## 2019-05-20 NOTE — Telephone Encounter (Signed)
[name] called and requested a refill for [facility] for [place].

## 2019-05-21 ENCOUNTER — Other Ambulatory Visit: Payer: Self-pay | Admitting: Hematology and Oncology

## 2019-05-21 ENCOUNTER — Emergency Department (HOSPITAL_COMMUNITY): Payer: Medicare Other

## 2019-05-21 ENCOUNTER — Encounter (HOSPITAL_COMMUNITY): Payer: Self-pay | Admitting: Emergency Medicine

## 2019-05-21 ENCOUNTER — Other Ambulatory Visit: Payer: Self-pay

## 2019-05-21 ENCOUNTER — Emergency Department (HOSPITAL_COMMUNITY)
Admission: EM | Admit: 2019-05-21 | Discharge: 2019-05-21 | Disposition: A | Discharge status: Home / Self Care | Payer: Medicare Other | Attending: Emergency Medicine | Admitting: Emergency Medicine

## 2019-05-21 DIAGNOSIS — M7989 Other specified soft tissue disorders: Secondary | ICD-10-CM | POA: Diagnosis not present

## 2019-05-21 DIAGNOSIS — Z91040 Latex allergy status: Secondary | ICD-10-CM | POA: Insufficient documentation

## 2019-05-21 DIAGNOSIS — W19XXXA Unspecified fall, initial encounter: Secondary | ICD-10-CM

## 2019-05-21 DIAGNOSIS — S59902A Unspecified injury of left elbow, initial encounter: Secondary | ICD-10-CM

## 2019-05-21 DIAGNOSIS — Y929 Unspecified place or not applicable: Secondary | ICD-10-CM | POA: Diagnosis not present

## 2019-05-21 DIAGNOSIS — Y9352 Activity, horseback riding: Secondary | ICD-10-CM | POA: Diagnosis not present

## 2019-05-21 DIAGNOSIS — J45909 Unspecified asthma, uncomplicated: Secondary | ICD-10-CM | POA: Insufficient documentation

## 2019-05-21 DIAGNOSIS — S6992XA Unspecified injury of left wrist, hand and finger(s), initial encounter: Secondary | ICD-10-CM | POA: Diagnosis not present

## 2019-05-21 DIAGNOSIS — Z79899 Other long term (current) drug therapy: Secondary | ICD-10-CM | POA: Diagnosis not present

## 2019-05-21 DIAGNOSIS — C719 Malignant neoplasm of brain, unspecified: Secondary | ICD-10-CM | POA: Insufficient documentation

## 2019-05-21 DIAGNOSIS — M79622 Pain in left upper arm: Secondary | ICD-10-CM | POA: Diagnosis not present

## 2019-05-21 DIAGNOSIS — Z452 Encounter for adjustment and management of vascular access device: Secondary | ICD-10-CM | POA: Diagnosis not present

## 2019-05-21 DIAGNOSIS — S199XXA Unspecified injury of neck, initial encounter: Secondary | ICD-10-CM | POA: Diagnosis not present

## 2019-05-21 DIAGNOSIS — Y999 Unspecified external cause status: Secondary | ICD-10-CM | POA: Diagnosis not present

## 2019-05-21 DIAGNOSIS — M25512 Pain in left shoulder: Secondary | ICD-10-CM | POA: Diagnosis not present

## 2019-05-21 DIAGNOSIS — S0990XA Unspecified injury of head, initial encounter: Secondary | ICD-10-CM | POA: Diagnosis not present

## 2019-05-21 DIAGNOSIS — S4992XA Unspecified injury of left shoulder and upper arm, initial encounter: Secondary | ICD-10-CM | POA: Diagnosis not present

## 2019-05-21 DIAGNOSIS — S59912A Unspecified injury of left forearm, initial encounter: Secondary | ICD-10-CM | POA: Diagnosis not present

## 2019-05-21 MED ORDER — BUTALBITAL-APAP-CAFFEINE 50-325-40 MG PO TABS: ORAL_TABLET | ORAL | 0 refills | Status: DC

## 2019-05-21 MED ORDER — METOCLOPRAMIDE HCL 5 MG/ML IJ SOLN
10.0000 mg | Freq: Once | INTRAMUSCULAR | Status: AC
  Administered 2019-05-21: 10 mg via INTRAVENOUS
  Filled 2019-05-21: qty 2

## 2019-05-21 MED ORDER — HYDROMORPHONE HCL 4 MG PO TABS: 4.0000 mg | ORAL_TABLET | Freq: Four times a day (QID) | ORAL | 0 refills | Status: DC | PRN

## 2019-05-21 MED ORDER — HYDROMORPHONE HCL 1 MG/ML IJ SOLN
1.0000 mg | Freq: Once | INTRAMUSCULAR | Status: AC
  Administered 2019-05-21: 1 mg via INTRAVENOUS
  Filled 2019-05-21: qty 1

## 2019-05-21 NOTE — ED Provider Notes (Signed)
[facility] DEPT Provider Note   CSN: [id number] Arrival date & time: 05/21/19  1747     History   Chief Complaint Chief Complaint  Patient presents with  . Shoulder Injury    HPI Victoria Bates is a 38 y.o. female.     HPI  Victoria Bates is a 38 y.o. female, with a history of adrenal tumor, malignant glioma, seizures, presenting to the ED for evaluation following a fall from a horse that occurred around 4:30 PM today. States she fell off the horse onto her left shoulder and arm. She thinks she may have hit her head, but is not sure. Complaining of pain to the left shoulder, upper arm, elbow, wrist, and hand, severe, throbbing.  She has tingling in left arm and nausea. Denies LOC, vomiting, numbness, vision abnormalities, abdominal pain, shortness of breath, lower extremity pain or injuries, or any other complaints.      Past Medical History:  Diagnosis Date  . Adrenal tumor   . Anemia   . Anxiety   . Asthma    very mild- no inhaler use since July 2013  . Brain tumor ([place])    Hx: of  . Cancer ([place]) 2003   skin  . Chronic fatigue 10/03/2014  . Cognitive impairment 08/13/2015  . Complication of anesthesia    takes longer for patient to go under anesthesia  . Dizziness   . Dysphasia 02/12/2014  . Glioma, malignant ([place]) dx'd 09/2013  . Headache 02/12/2014  . Headache(784.0)    migraines  . Herniated disc   . Hot flashes 10/03/2014  . Infection    to right head incision  . 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 Liver hemangioma   . Liver lesion 08/22/2014  . Nausea alone 02/12/2014  . Neuropathic pain 04/08/2014  . Petit mal seizure status ([place])   . Seizures ([place])    petit mal serizures most days    Patient Active Problem List   Diagnosis Date Noted  . Dysuria 08/03/2018  . Elevated BP without diagnosis of hypertension 08/03/2018  . Chronic leukopenia 08/03/2018  . Acute pain 04/18/2018  . Polypharmacy 11/14/2017  . Easy bruising  11/14/2017  . Sinus infection 11/14/2017  . Pulmonary nodules 08/18/2017  . Vitamin D deficiency 07/19/2017  . Vitamin B12 deficiency 07/19/2017  . Cough, persistent 06/20/2017  . Orthostatic dizziness 01/24/2017  . Addison disease ([place]) 01/16/2017  . Anxiety 01/16/2017  . 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 Goals of care, counseling/discussion 11/20/2016  . Pancytopenia, acquired ([place]) 10/10/2016  . Deficiency anemia 10/10/2016  . Upper airway cough syndrome 01/28/2016  . Cough variant asthma 01/28/2016  . Dyspnea 01/28/2016  . 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 Constipation, acute 10/12/2015  . Cognitive impairment 08/13/2015  . Dysphagia, idiopathic 04/30/2015  . Low serum cortisol level ([place]) 10/20/2014  . 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 Chronic fatigue 10/03/2014  . Hot flashes 10/03/2014  . Liver lesion 08/22/2014  . Pancytopenia due to antineoplastic chemotherapy ([place]) 06/23/2014  . Genetic testing 06/13/2014  . Intractable chronic cluster headache 04/28/2014  . Thrombocytopenia ([place]) 04/28/2014  . Neuropathic pain 04/08/2014  . Simple partial seizures ([place]) 03/20/2014  . Sensory seizure ([place]) 03/20/2014  . Dysphasia 02/12/2014  . Headache 02/12/2014  . Seizures ([place]) 02/12/2014  . Financial difficulty 02/12/2014  . CIN I (cervical intraepithelial  neoplasia I) 02/12/2014  . Anemia, unspecified 02/12/2014  . 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 Paresthesias 09/20/2013  . Blurred vision, left eye 09/20/2013  . Asthma 07/24/2009  . CHEST PAIN 07/24/2009    Past Surgical History:  Procedure Laterality Date  . ABDOMINAL HYSTERECTOMY    . CESAREAN  SECTION    . COLONOSCOPY WITH PROPOFOL N/A 08/29/2012   Procedure: COLONOSCOPY WITH PROPOFOL;  Surgeon: Arta Silence, MD;  Location: [facility];  Service: Endoscopy;  Laterality: N/A;  . coposcopy  08/06/2012   cervical biopsy  . CRANIOTOMY Right 09/26/2013   Procedure: Awake Right frontal open brain biopsy;  Surgeon: Winfield Cunas, MD;  Location: [facility];  Service: Neurosurgery;  Laterality: Right;  awake Right frontal open brain biopsy  . CRANIOTOMY N/A 10/30/2013   Procedure: debridement of cranial wound  right temporal incision, ;  Surgeon: Winfield Cunas, MD;  Location: [facility];  Service: Neurosurgery;  Laterality: N/A;  . CRANIOTOMY N/A 01/23/2014   Procedure: Awake Craniotomy with BrainLab;  Surgeon: Winfield Cunas, MD;  Location: [facility];  Service: Neurosurgery;  Laterality: N/A;  Awake Craniotomy  tumor with BrainLab  . ESOPHAGOGASTRODUODENOSCOPY (EGD) WITH PROPOFOL N/A 08/29/2012   Procedure: ESOPHAGOGASTRODUODENOSCOPY (EGD) WITH PROPOFOL;  Surgeon: Arta Silence, MD;  Location: [facility];  Service: Endoscopy;  Laterality: N/A;  . IR RADIOLOGY PERIPHERAL GUIDED IV START  12/28/2017  . IR REMOVAL TUN ACCESS W/ PORT W/O FL MOD SED  12/28/2017  . IR US GUIDE VASC ACCESS LEFT  12/28/2017  . left knee surgery  2000   torn meniscus  . right flank surgery  2005   bnign mass from flank area-right  . ROBOTIC ASSISTED TOTAL HYSTERECTOMY WITH BILATERAL SALPINGO OOPHERECTOMY Bilateral 09/25/2012   Procedure: ROBOTIC ASSISTED TOTAL HYSTERECTOMY WITH BILATERAL SALPINGO OOPHORECTOMY;  Surgeon: Maeola Sarah. Landry Mellow, MD;  Location: [facility];  Service: Gynecology;  Laterality: Bilateral;  . ROTATOR CUFF REPAIR    . WISDOM TOOTH EXTRACTION       OB History   No obstetric history on file.      Home Medications    Prior to Admission medications   Medication Sig Start Date End Date Taking? Authorizing Provider  butalbital-acetaminophen-caffeine (FIORICET) 50-325-40 MG tablet TAKE 1 TABLET BY  MOUTH EVERY 6 HOURS AS NEEDED FOR HEADACHE 05/21/19  Yes Gorsuch, Ni, MD  carisoprodol (SOMA) 350 MG tablet TAKE 1 TABLET BY MOUTH 4 TIMES DAILY FOR SEIZURE PREVENTION 12/06/18  Yes Heath Lark, MD  divalproex (DEPAKOTE ER) 250 MG 24 hr tablet Take 2 tabs in AM, 3 tabs in PM 02/22/19  Yes Cameron Sprang, MD  eletriptan (RELPAX) 20 MG tablet Take 1 tablet as needed for migraine. Do not take more than 2-3 a week 02/22/19  Yes Cameron Sprang, MD  gabapentin (NEURONTIN) 300 MG capsule Take 4 capsules twice a day 02/22/19  Yes Cameron Sprang, MD  levocetirizine (XYZAL) 5 MG tablet Take 1 tablet (5 mg total) by mouth every evening. 10/08/18  Yes Gorsuch, Ni, MD  LORazepam (ATIVAN) 1 MG tablet TAKE 1 TABLET BY MOUTH EVERY 8 HOURS AS NEEDED FOR ANXIETY 05/20/19  Yes Gorsuch, Ni, MD  montelukast (SINGULAIR) 10 MG tablet Take 1 tablet (10 mg total) by mouth at bedtime. 10/08/18  Yes Gorsuch, Ni, MD  Cyanocobalamin (VITAMIN B-12 PO) Take 5,000 Units daily by mouth.  [provider]  EPINEPHRINE 0.3 mg/0.3 mL IJ SOAJ injection INJECT 0.3 MLS (ONE SYRINGE) INTO THE MUSCLE ONCE FOR 1 DOSE AS NEEDED. Patient taking differently: Inject 0.3 mg into the muscle once.  03/23/18   Kozlow, Donnamarie Poag, MD  HYDROcodone-acetaminophen (NORCO) 10-325 MG tablet TAKE 1 TABLET BY MOUTH EVERY 4 HOURS AS NEEDED 05/20/19   Heath Lark, MD  HYDROmorphone (DILAUDID) 4 MG tablet Take 1 tablet (4 mg total) by mouth every 6 (six) hours as needed for severe pain. 05/21/19   ,  C, PA-C  promethazine (PHENERGAN) 25 MG tablet Take 25 mg every 6 (six) hours as needed by mouth for nausea or vomiting.    [provider]    Family History Family History  Problem Relation Age of Onset  . Hypertension Mother   . Skin cancer Mother   . Thyroid cancer Father   . Skin cancer Father   . Breast cancer Maternal Aunt        dx in her 32s  . Prostate cancer Maternal Uncle 63  . Melanoma Maternal Uncle   . Ovarian cancer Maternal  Grandmother        dx in her late 54s-40s  . Breast cancer Maternal Aunt        bilateral breast cancer dx in 78s and 53s; possibly BRCA+  . Other Son        multiple CAL spots, bright spots on brain MRI, ? chiari malformation    Social History Social History   Tobacco Use  . Smoking status: Never Smoker  . Smokeless tobacco: Never Used  Substance Use Topics  . Alcohol use: No    Alcohol/week: 0.0 standard drinks  . Drug use: No     Allergies   Adhesive [tape], Latex, Medical adhesive remover, Percocet [oxycodone-acetaminophen], Shellfish allergy, Soma [carisoprodol], Tramadol hcl, Vancomycin, and Zofran [ondansetron hcl]   Review of Systems Review of Systems  Constitutional: Negative for diaphoresis.  Respiratory: Negative for shortness of breath.   Cardiovascular: Negative for chest pain.  Gastrointestinal: Positive for nausea. Negative for abdominal pain and vomiting.  Musculoskeletal: Positive for arthralgias and joint swelling. Negative for back pain and neck pain.  Neurological: Positive for weakness. Negative for numbness.  All other systems reviewed and are negative.    Physical Exam Updated Vital Signs BP (!) 222/166 (BP Location: Right Arm)   Pulse (!) 107   Temp 98.9 F (37.2 C) (Oral)   Resp 20   LMP 09/20/2012   SpO2 99%   Physical Exam Vitals signs and nursing note reviewed.  Constitutional:      General: She is not in acute distress.    Appearance: She is well-developed. She is not diaphoretic.  HENT:     Head: Normocephalic and atraumatic.     Mouth/Throat:     Mouth: Mucous membranes are moist.     Pharynx: Oropharynx is clear.  Eyes:     Conjunctiva/sclera: Conjunctivae normal.  Neck:     Musculoskeletal: Normal range of motion and neck supple. No muscular tenderness.  Cardiovascular:     Rate and Rhythm: Normal rate and regular rhythm.     Pulses: Normal pulses.          Radial pulses are 2+ on the right side and 2+ on the left side.        Posterior tibial pulses are 2+ on the right side and 2+ on the left side.     Heart sounds: Normal heart sounds.  Comments: Tactile temperature in the extremities appropriate and equal bilaterally. Pulmonary:     Effort: Pulmonary effort is normal. No respiratory distress.     Breath sounds: Normal breath sounds.  Chest:       Comments: Tenderness to left lateral and posterior ribs without noted deformity, instability, crepitus, color change, or swelling. Abdominal:     Palpations: Abdomen is soft.     Tenderness: There is no abdominal tenderness. There is no guarding.  Musculoskeletal:       Back:     Right lower leg: No edema.     Left lower leg: No edema.     Comments: Tenderness to the left superior and lateral shoulder. Tenderness and swelling extends down the humerus and into the left elbow. Further tenderness along the left forearm and into the left wrist without noted color change, swelling, deformity, or instability.  She has tenderness in the region of the anatomical snuffbox. Tenderness along the left thumb, no swelling or deformity.  Normal motor function intact in all other extremities. No midline spinal tenderness.   No tenderness, pain, or noted instability to the hips or lower extremities.  Overall trauma exam performed without any abnormalities noted other than those mentioned.  Skin:    General: Skin is warm and dry.  Neurological:     Mental Status: She is alert.     Comments: Sensation to light touch grossly intact in left upper extremity and fingers. Motor function intact in the left fingers. She is unable to hold her left arm in an elbow flexed position without supporting it.  Psychiatric:        Mood and Affect: Mood and affect normal.        Speech: Speech normal.        Behavior: Behavior normal.      ED Treatments / Results  Labs (all labs ordered are listed, but only abnormal results are displayed) Labs Reviewed - No data to display   EKG None  Radiology Dg Chest 1 View  Result Date: 05/21/2019 CLINICAL DATA:  05/04/2017 EXAM: CHEST  1 VIEW COMPARISON:  Removal of right-sided central venous port. No consolidation or effusion. Normal heart size. No pneumothorax. Mild scoliosis. FINDINGS: The heart size and mediastinal contours are within normal limits. Both lungs are clear. The visualized skeletal structures are unremarkable. IMPRESSION: No active disease. Electronically Signed   By: Donavan Foil M.D.   On: 05/21/2019 19:48   Originally placed an order for x-ray of the chest and ribs, however, patient declined this x-ray while in the xray room stating she did not think her ribs needed to be evaluated.  We were able to compromise on a 1 view chest to assess her lungs.  Dg Forearm Left  Result Date: 05/21/2019 CLINICAL DATA:  [place] off horse EXAM: LEFT FOREARM - 2 VIEW COMPARISON:  None. FINDINGS: There is no evidence of fracture or other focal bone lesions. Edema at the antecubital fossa. IMPRESSION: No acute osseous abnormality. Electronically Signed   By: Donavan Foil M.D.   On: 05/21/2019 19:51   Dg Wrist Complete Left  Result Date: 05/21/2019 CLINICAL DATA:  [place] off horse EXAM: LEFT WRIST - COMPLETE 3+ VIEW COMPARISON:  None. FINDINGS: There is no evidence of fracture or dislocation. There is no evidence of arthropathy or other focal bone abnormality. Soft tissues are unremarkable. IMPRESSION: Negative. Electronically Signed   By: Donavan Foil M.D.   On: 05/21/2019 19:52   Ct Head Wo Contrast  Result Date: 05/21/2019 CLINICAL DATA:  [place] from a horse.  History of glioblastoma. EXAM: CT HEAD WITHOUT CONTRAST CT CERVICAL SPINE WITHOUT CONTRAST TECHNIQUE: Multidetector CT imaging of the head and cervical spine was performed following the standard protocol without intravenous contrast. Multiplanar CT image reconstructions of the cervical spine were also generated. COMPARISON:  None. Brain MRI 06/01/2016 FINDINGS: CT HEAD  FINDINGS Brain: The ventricles are in the midline without mass effect or shift. No extra-axial fluid collections are identified. The gray-white differentiation is maintained. Remote surgical changes from a prior right parietal frontal craniotomy. Encephalomalacia but no findings suspicious for recurrent tumor. The brainstem and cerebellum appear normal. Vascular: No hyperdense vessels or aneurysm. Skull: Right-sided craniotomy changes. No skull fracture or bone lesion. CT CERVICAL SPINE FINDINGS Alignment: Mild reversal of the normal cervical lordosis. The vertebral bodies are normally aligned. Skull base and vertebrae: No acute fracture. No primary bone lesion or focal pathologic process. Soft tissues and spinal canal: No prevertebral fluid or swelling. No visible canal hematoma. Disc levels: The spinal canal is generous. No spinal or foraminal stenosis. The facets are normally aligned. No facet or laminar fractures. Upper chest: The lung apices are grossly clear. Other: No neck mass or adenopathy or hematoma. IMPRESSION: 1. Remote surgical changes from a right frontal parietal craniotomy with encephalomalacia. No findings suspicious for recurrent tumor. 2. No acute intracranial findings or skull fracture. 3. Mild reversal of the normal cervical lordosis but normal alignment of the vertebral bodies and no acute fracture. Electronically Signed   By: Marijo Sanes M.D.   On: 05/21/2019 19:33   Ct Cervical Spine Wo Contrast  Result Date: 05/21/2019 CLINICAL DATA:  [place] from a horse.  History of glioblastoma. EXAM: CT HEAD WITHOUT CONTRAST CT CERVICAL SPINE WITHOUT CONTRAST TECHNIQUE: Multidetector CT imaging of the head and cervical spine was performed following the standard protocol without intravenous contrast. Multiplanar CT image reconstructions of the cervical spine were also generated. COMPARISON:  None. Brain MRI 06/01/2016 FINDINGS: CT HEAD FINDINGS Brain: The ventricles are in the midline without mass  effect or shift. No extra-axial fluid collections are identified. The gray-white differentiation is maintained. Remote surgical changes from a prior right parietal frontal craniotomy. Encephalomalacia but no findings suspicious for recurrent tumor. The brainstem and cerebellum appear normal. Vascular: No hyperdense vessels or aneurysm. Skull: Right-sided craniotomy changes. No skull fracture or bone lesion. CT CERVICAL SPINE FINDINGS Alignment: Mild reversal of the normal cervical lordosis. The vertebral bodies are normally aligned. Skull base and vertebrae: No acute fracture. No primary bone lesion or focal pathologic process. Soft tissues and spinal canal: No prevertebral fluid or swelling. No visible canal hematoma. Disc levels: The spinal canal is generous. No spinal or foraminal stenosis. The facets are normally aligned. No facet or laminar fractures. Upper chest: The lung apices are grossly clear. Other: No neck mass or adenopathy or hematoma. IMPRESSION: 1. Remote surgical changes from a right frontal parietal craniotomy with encephalomalacia. No findings suspicious for recurrent tumor. 2. No acute intracranial findings or skull fracture. 3. Mild reversal of the normal cervical lordosis but normal alignment of the vertebral bodies and no acute fracture. Electronically Signed   By: Marijo Sanes M.D.   On: 05/21/2019 19:33   Dg Shoulder Left  Result Date: 05/21/2019 CLINICAL DATA:  [place] off horse EXAM: LEFT SHOULDER - 2+ VIEW COMPARISON:  None. FINDINGS: No fracture or malalignment.  AC joint is intact. IMPRESSION: No acute osseous abnormality Electronically Signed   By: Maudie Mercury  Francoise Ceo M.D.   On: 05/21/2019 19:46   Dg Humerus Left  Result Date: 05/21/2019 CLINICAL DATA:  Fall with pain and swelling EXAM: LEFT HUMERUS - 2+ VIEW COMPARISON:  None. FINDINGS: There is no evidence of fracture or other focal bone lesions. Soft tissues are unremarkable. IMPRESSION: Negative. Electronically Signed   By: Donavan Foil M.D.   On: 05/21/2019 19:49   Dg Hand Complete Left  Result Date: 05/21/2019 CLINICAL DATA:  [place] off horse EXAM: LEFT HAND - COMPLETE 3+ VIEW COMPARISON:  None. FINDINGS: There is no evidence of fracture or dislocation. There is no evidence of arthropathy or other focal bone abnormality. Soft tissues are unremarkable. IMPRESSION: Negative. Electronically Signed   By: Donavan Foil M.D.   On: 05/21/2019 19:50    Procedures Procedures (including critical care time)  Medications Ordered in ED Medications  HYDROmorphone (DILAUDID) injection 1 mg (1 mg Intravenous Given 05/21/19 1846)  metoCLOPramide (REGLAN) injection 10 mg (10 mg Intravenous Given 05/21/19 1846)  HYDROmorphone (DILAUDID) injection 1 mg (1 mg Intravenous Given 05/21/19 2157)     Initial Impression / Assessment and Plan / ED Course  I have reviewed the triage vital signs and the nursing notes.  Pertinent labs & imaging results that were available during my care of the patient were reviewed by me and considered in my medical decision making (see chart for details).  Clinical Course as of May 20 2310  Tue May 21, 2019  2124 Patient tells me her blood pressure is occasionally this high and may be due to pain.  BP(!): 240/149 [SJ]  2205 This is more consistent with patient's previous recorded values.  Improvement was noted after additional pain management and patient taking her home Ativan.  BP(!): 157/110 [SJ]    Clinical Course User Index [SJ] ,  C, PA-C       Patient presents with left upper extremity injury following a fall from a horse. She has weakness and difficulty with flexion at the left elbow.  Swelling, tenderness, and pain in the same region.  No acute abnormalities noted on imaging studies.  I do have suspicion for possible muscular or connective tissue injury, however, based on the patient's inability to flex at the elbow. She was placed in a sling for comfort as well as a thumb spica splint  for her anatomical snuffbox tenderness.  Patient's arm reevaluated multiple times during her ED course and just prior to discharge.  Compartments remain soft.  Continues to have signs of good circulation distally in the arm and hand.  States she has an established relationship with Dr. Percell Miller for general orthopedics and Dr. Burney Gauze for hand and would like referrals to these physicians.   Findings and plan of care discussed with Verneda Skill, MD. Dr. Roderic Palau personally evaluated and examined this patient.  Final Clinical Impressions(s) / ED Diagnoses   Final diagnoses:  Injury of left elbow, initial encounter    ED Discharge Orders         Ordered    HYDROmorphone (DILAUDID) 4 MG tablet  Every 6 hours PRN     05/21/19 2212           Lorayne Bender, PA-C 05/21/19 2313    [facility] Ferguson, MD 05/22/19 2247

## 2019-05-21 NOTE — ED Notes (Signed)
Patient ambulated to restroom with minimal assistance. Patient is requesting additional pain medication at this time. EDP made aware.

## 2019-05-21 NOTE — Discharge Instructions (Addendum)
You have been seen today for an elbow injury. There were no acute abnormalities on the x-rays, including no sign of fracture or dislocation, however, there could be injuries to the soft tissues, such as the ligaments or tendons that are not seen on xrays. There could also be what are called occult fractures that are small fractures not seen on xray. Antiinflammatory medications: Take 600 mg of ibuprofen every 6 hours or 440 mg (over the counter dose) to 500 mg (prescription dose) of naproxen every 12 hours for the next 3 days. After this time, these medications may be used as needed for pain. Take these medications with food to avoid upset stomach. Choose only one of these medications, do not take them together. Acetaminophen (generic for Tylenol): Should you continue to have additional pain while taking the ibuprofen or naproxen, you may add in acetaminophen as needed. Your daily total maximum amount of acetaminophen from all sources should be limited to 4000mg /day for persons without liver problems, or 2000mg /day for those with liver problems. Ice: May apply ice to the area over the next 24 hours for 15 minutes at a time to reduce swelling. Elevation: Keep the extremity elevated as often as possible to reduce pain and inflammation. Support: Wear the sling and wrist splint for support and comfort. Wear this until pain resolves.  Follow up: Follow-up with the orthopedic specialist, ideally within the next week. Return: Return to the ED for numbness, weakness, increasing pain, overall worsening symptoms, paleness to the hand or fingers, or any other major concerns.  For prescription assistance, may try using prescription discount sites or apps, such as goodrx.com

## 2019-05-21 NOTE — Telephone Encounter (Signed)
done

## 2019-05-21 NOTE — ED Triage Notes (Signed)
Patient here from home with complaints of should pain. Reports falling off horse. Deformity noted. Cancer patient,

## 2019-05-22 DIAGNOSIS — M25512 Pain in left shoulder: Secondary | ICD-10-CM | POA: Diagnosis not present

## 2019-05-22 DIAGNOSIS — M25522 Pain in left elbow: Secondary | ICD-10-CM | POA: Diagnosis not present

## 2019-05-22 DIAGNOSIS — M79642 Pain in left hand: Secondary | ICD-10-CM | POA: Diagnosis not present

## 2019-05-22 MED FILL — HYDROmorphone HCL 4 MG TABS: 4 | 3 days supply | Qty: 10 | Fill #0

## 2019-05-29 ENCOUNTER — Other Ambulatory Visit: Payer: Self-pay | Admitting: Hematology and Oncology

## 2019-05-29 ENCOUNTER — Encounter: Payer: Self-pay | Admitting: Hematology and Oncology

## 2019-05-29 ENCOUNTER — Telehealth: Payer: Self-pay | Admitting: *Deleted

## 2019-05-29 NOTE — Telephone Encounter (Signed)
Patient's husband called to request an early refill of Hydrocodone. Patient recently broke her arm and has used a few more pills than normal. He would like to have this sent to [facility] please.

## 2019-05-29 NOTE — Telephone Encounter (Signed)
I sense a pattern of narcotic abuse  When she went to the ER she was prescribed Dilaudid for pain, which according to the record was filled I will not refill hydrocodone sooner than prescribed In fact she has repeatedly violated our narcotic agreement I suggest seeing her next week to discuss chronic pain management

## 2019-05-29 NOTE — Telephone Encounter (Signed)
Patient returned call she will be going for an MRI on Saturday. She sees Dr. Percell Miller on Monday at 10am and will be coming here right after. Appt scheduled. She has used up her hydrocodone early and will continue dilaudid until her appt Monday.

## 2019-05-29 NOTE — Telephone Encounter (Signed)
Spoke to [employer] and advise this office is not able to refill medication early. Per Dr. Alvy Bimler she would like to see her Monday at 33 for chronic pain management. Left message for patient to return call. [name] did not have knowledge of her schedule to commit to an appt.

## 2019-06-01 DIAGNOSIS — M25522 Pain in left elbow: Secondary | ICD-10-CM | POA: Diagnosis not present

## 2019-06-01 DIAGNOSIS — M25512 Pain in left shoulder: Secondary | ICD-10-CM | POA: Diagnosis not present

## 2019-06-03 ENCOUNTER — Encounter: Payer: Self-pay | Admitting: Hematology and Oncology

## 2019-06-03 ENCOUNTER — Other Ambulatory Visit: Payer: Self-pay

## 2019-06-03 ENCOUNTER — Telehealth: Payer: Self-pay

## 2019-06-03 ENCOUNTER — Inpatient Hospital Stay: Payer: Medicare Other | Attending: Hematology and Oncology | Admitting: Hematology and Oncology

## 2019-06-03 DIAGNOSIS — C711 Malignant neoplasm of frontal lobe: Secondary | ICD-10-CM | POA: Insufficient documentation

## 2019-06-03 DIAGNOSIS — Y9352 Activity, horseback riding: Secondary | ICD-10-CM | POA: Insufficient documentation

## 2019-06-03 DIAGNOSIS — Z9221 Personal history of antineoplastic chemotherapy: Secondary | ICD-10-CM | POA: Insufficient documentation

## 2019-06-03 DIAGNOSIS — R4702 Dysphasia: Secondary | ICD-10-CM

## 2019-06-03 DIAGNOSIS — R110 Nausea: Secondary | ICD-10-CM

## 2019-06-03 DIAGNOSIS — Z885 Allergy status to narcotic agent status: Secondary | ICD-10-CM | POA: Diagnosis not present

## 2019-06-03 DIAGNOSIS — C719 Malignant neoplasm of brain, unspecified: Secondary | ICD-10-CM | POA: Diagnosis not present

## 2019-06-03 DIAGNOSIS — G44021 Chronic cluster headache, intractable: Secondary | ICD-10-CM | POA: Diagnosis not present

## 2019-06-03 DIAGNOSIS — G3184 Mild cognitive impairment, so stated: Secondary | ICD-10-CM | POA: Diagnosis not present

## 2019-06-03 DIAGNOSIS — M419 Scoliosis, unspecified: Secondary | ICD-10-CM | POA: Insufficient documentation

## 2019-06-03 DIAGNOSIS — M25512 Pain in left shoulder: Secondary | ICD-10-CM | POA: Diagnosis not present

## 2019-06-03 DIAGNOSIS — R519 Headache, unspecified: Secondary | ICD-10-CM | POA: Diagnosis not present

## 2019-06-03 DIAGNOSIS — R202 Paresthesia of skin: Secondary | ICD-10-CM | POA: Diagnosis not present

## 2019-06-03 DIAGNOSIS — Z881 Allergy status to other antibiotic agents status: Secondary | ICD-10-CM | POA: Insufficient documentation

## 2019-06-03 DIAGNOSIS — R4189 Other symptoms and signs involving cognitive functions and awareness: Secondary | ICD-10-CM | POA: Diagnosis not present

## 2019-06-03 DIAGNOSIS — Z888 Allergy status to other drugs, medicaments and biological substances status: Secondary | ICD-10-CM | POA: Diagnosis not present

## 2019-06-03 DIAGNOSIS — G9389 Other specified disorders of brain: Secondary | ICD-10-CM | POA: Diagnosis not present

## 2019-06-03 DIAGNOSIS — R0789 Other chest pain: Secondary | ICD-10-CM

## 2019-06-03 DIAGNOSIS — Z79899 Other long term (current) drug therapy: Secondary | ICD-10-CM | POA: Diagnosis not present

## 2019-06-03 MED ORDER — HYDROCODONE-ACETAMINOPHEN 10-325 MG PO TABS: 1.0000 | ORAL_TABLET | ORAL | 0 refills | Status: DC | PRN

## 2019-06-03 MED ORDER — CARISOPRODOL 350 MG PO TABS: ORAL_TABLET | ORAL | 5 refills | Status: DC

## 2019-06-03 MED FILL — HYDROCODON-APAP 10-325: 10-325 | 15 days supply | Qty: 90 | Fill #0

## 2019-06-03 NOTE — Telephone Encounter (Signed)
Called back and spoke with Victoria Bates. He will be present for appt today.

## 2019-06-03 NOTE — Telephone Encounter (Signed)
Called and left a message asking Victoria Bates to call the office. Dr. Alvy Bimler is requesting Victoria Bates come in for appt with Dr. Alvy Bimler.

## 2019-06-04 ENCOUNTER — Telehealth: Payer: Self-pay

## 2019-06-05 ENCOUNTER — Encounter: Payer: Self-pay | Admitting: Hematology and Oncology

## 2019-06-05 NOTE — Assessment & Plan Note (Addendum)
The patient has mild cognitive impairment with memory issues Her husband is here with the patient to take notes about plan of care

## 2019-06-05 NOTE — Assessment & Plan Note (Signed)
Patient has been on numerous agents to treat her chronic headaches with no improvement In fact, she has been dependent on chronic narcotics over the last few years Previously, we have discussions about narcotic tapering as well as getting her off Fioricet among other things I recommend neurology consult to manage her chronic headaches rather than her being on chronic narcotic prescription

## 2019-06-05 NOTE — Assessment & Plan Note (Addendum)
The patient has been on numerous different medications and polypharmacy to treat various different complaints Due to recent problem with narcotic prescription, I do not feel comfortable refilling her prescription of hydrocodone, Fioricet, Soma and lorazepam long-term without expert guidance from neurologist and pain specialist I told the patient I do not plan to keep her on these medications indefinitely The patient felt comfortable being on this medication and was able to function but I do not believe this is the case as her pain medicine requirement has escalated over the past year  She is somewhat resistant to the idea of medication taper I recommend we work with her neurologist and primary care doctor However, the patient stated she had encounter significant difficulties with various different providers of getting appointment in time She told me she has to wait many months just to get a neurologist appointment which I found it difficult to understand why that is the case Recently, because of her elbow problem, she was prescribed a different narcotic which she filled without letting me know until a few days later.  This action, unfortunately, violated our verbal narcotic refill policy which was discussed several times over the last 5 years She also escalated and took more hydrocodone as prescribed which in the past we have warned her that this is not an appropriate action despite numerous discussion in the past Ultimately, I gave the patient 60-day notice to find a new provider to refill all her medications On the other hand, I am willing to work with her to initiate narcotic prescription taper over many months We have a standing appointment in January to discuss this further If she is willing, I will be interested to work with her to taper her hydrocodone off over the next 6 months followed by Fioricet off over a period of 4 months In addition, I will also initiate Soma taper over the course of 4  months and lorazepam over the course of 6 months However, at the mention of medication taper, the patient started crying and I am not able to make progress talking to her about this I gave her suggestions about how to find new primary care doctor, neurologist or a pain specialist I am willing to make the referral for her She will discuss this further with her husband at home

## 2019-06-05 NOTE — Progress Notes (Signed)
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 The patient has no evidence of cancer progression over the last 5 years We will continue at most once a year annual MRI She had negative evaluation in May 2020  Cognitive impairment The patient has mild cognitive impairment with memory issues Her husband is here with the patient to take notes about plan of care  Intractable chronic cluster headache Patient has been on numerous agents to treat her chronic headaches with no improvement In fact, she has been dependent on chronic narcotics over the last few years Previously, we have discussions about narcotic tapering as well as getting her off Fioricet among other things I recommend neurology consult to manage her chronic headaches rather than her being on chronic narcotic prescription  Polypharmacy The patient has been on numerous different medications and polypharmacy to treat various different complaints Due to recent problem with narcotic prescription, I do not feel comfortable refilling her prescription of hydrocodone, Fioricet, Soma and lorazepam long-term without expert guidance from neurologist and pain specialist I told the patient I do not plan to keep her on these medications indefinitely The patient felt comfortable being on this medication and was able to function but I do not believe  this is the case as her pain medicine requirement has escalated over the past year  She is somewhat resistant to the idea of medication taper I recommend we work with her neurologist and primary care doctor However, the patient stated she had encounter significant difficulties with various different providers of getting appointment in time She told me she has to wait many months just to get a neurologist appointment which I found it difficult to understand why that is the case Recently, because of her elbow problem, she was prescribed a different narcotic which she filled without letting me know until a few days later.  This action, unfortunately, violated our verbal narcotic refill policy which was discussed several times over the last 5 years She also escalated and took more hydrocodone as prescribed which in the past we have warned her that this is not an appropriate action despite numerous discussion in the past Ultimately, I gave the patient 60-day notice to find a new provider to refill all her medications On the other hand, I am willing to work with her to initiate narcotic prescription taper over many months We have a standing appointment in January to discuss this further If she is willing, I will be interested to work with her to taper her hydrocodone off over the next 6 months followed by Fioricet off over a period of 4 months In addition, I will also initiate Soma taper over the course of 4 months and lorazepam over the course of 6 months However, at the mention of medication taper, the patient started crying and I am not able to make progress talking to her about this I gave her suggestions about how to find new primary care doctor, neurologist or a pain specialist I am willing to   make the referral for her She will discuss this further with her husband at home   No orders of the defined types were placed in this encounter.   INTERVAL HISTORY: Please see below for problem oriented  charting. 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   09/27/2013 Pathology Results   Accession: [id number] pathology showed grade 2 oligodendroglioma with mutant IDH1 protein, ATRX, OLIG 2 expression. p53 is negative, low Ki-67 labeling index. Molecular SNP array studies demonstrate whole arm 1p19q codeletion."   10/28/2013 Imaging   Ct scan of head showed possible abscess.   11/06/2013 Imaging   Repeat CT scan of the head showed resolution of abscess.   01/23/2014 Surgery   Dr. Christella Noa perform a subtotal gross resection of the tumor.   01/24/2014 Imaging   Repeat imaging studies showed persistent and residual disease.   03/03/2014 - 09/01/2014 Chemotherapy   She was started on Temodar, 5 days every 28 days   03/13/2014 Imaging   Repeat MRI showed no evidence of progression of disease.   07/14/2014 Imaging   Repeat MRI showed no evidence of disease progression.   09/12/2014 Imaging   MRI of the liver show mild regression of the size of lesions, presumed to be benign   09/15/2014 Imaging   Repeat MRI of the brain show persistent abnormalities, suspicious for residual disease   03/02/2015 Imaging    MRI brain at [place] show persistent abnormalities   08/10/2015 Imaging   Repeat MRI [facility] showed stable disease   08/24/2015 - 02/08/2016 Chemotherapy   She was started on Lomustine, Procarbazine and Vincristine chemotherapy   08/30/2015 Imaging   CT chest showed no  evidence of aspiration pneumonia   11/18/2015 Imaging   MRI brain showed status post resection of right frontal lobe tumor without residual or recurrent tumor. 2. Otherwise negative MRI of the brain.    03/02/2016 Imaging   Stable MRI post right frontal lobe tumor resection. Negative for recurrent tumor   06/01/2016 Imaging   Stable MRI post right frontal lobe tumor resection. No recurrent tumor identified   08/19/2016 Imaging   Unchanged examination without tumor recurrence.   11/17/2016 Imaging   Stable right frontal resection cavity. Stable focal mass effect within the right middle frontal gyrus at the superior margin of the cavity which may represent nonenhancing neoplasm. No evidence for tumor progression. Otherwise stable MRI of the brain   12/16/2016 Imaging   MR brain: 1. No acute intracranial abnormality. 2. Unchanged appearance of right frontal lobe resection cavity and surrounding hyperintense T2 weighted signal.   02/04/2017 Imaging   Repeat MRI Normal pituitary  Stable postop changes of right frontal tumor resection   05/12/2017 Imaging   The left ventricular size is normal. There is normal left ventricular wall thickness.  Left ventricular systolic function is normal. LV ejection fraction = 60-65%.  Left ventricular filling pattern is normal. The left ventricular wall motion is normal. The right ventricle is normal in size and function. There is  no significant valvular stenosis or regurgitation No pulmonary hypertension. IVC size was normal. There is no pericardial effusion. There is no comparison study available   05/13/2017 Imaging   1.No evidence of acute pulmonary thromboembolic disease. 2.There is an 8 mm right lower lobe groundglass nodule. Statistically, in this age group, this is most likely to reflect benign etiologies such as a tiny focus of infection or nonspecific inflammation. 4 mm solid right upper lobe lung nodule. A CT scan could be obtained in  12 months to further evaluate if the patient has any known risk factors for malignancy. 3.There is a 1.3 cm left adrenal nodule. This finding is incompletely characterized although and is slightly increased in size relative to the abdominal MRI from February 2016. Although this finding is most likely benign, recommend follow-up adrenal protocol CT in one year.   05/13/2017 Imaging   MRI brain 1.No acute intracranial abnormality. 2.Postsurgical changes of a right frontal craniotomy for resection of a reported oligodendroglioma. Hyperintense T2/FLAIR signal around the resection cavity and in the subcortical white matter of a gyrus along the superior margin of the cavity is unchanged from 01/05/2015. No evidence of disease progression.   12/15/2017 Imaging   MRI brain at [facility] 1.No acute intracranial abnormality. 2.Postsurgical changes of right frontal craniotomy for resection of reported oligodendroglioma. No evidence of disease progression   12/28/2017 Procedure   Successful right IJ vein Port-A-Cath explant.   04/24/2018 Imaging   Stable post treatment appearance, RIGHT frontal oligodendroglioma.  No posttraumatic sequelae are evident.   01/30/2019 Imaging   Stable appearance of treated right frontal oligodendroglioma. No change from 04/24/2018.     REVIEW OF SYSTEMS:  All other systems were reviewed with the patient and are negative.  I have reviewed the past medical history, past surgical history, social history and family history with the patient and they are unchanged from previous note.  ALLERGIES:  is allergic to adhesive [tape]; latex; medical adhesive remover; percocet [oxycodone-acetaminophen]; shellfish allergy; soma [carisoprodol]; tramadol hcl; vancomycin; and zofran [ondansetron hcl].  MEDICATIONS:  Current Outpatient Medications  Medication Sig Dispense Refill  . butalbital-acetaminophen-caffeine (FIORICET) 50-325-40 MG tablet TAKE 1 TABLET BY MOUTH EVERY 6  HOURS AS NEEDED FOR HEADACHE 90 tablet 0  . carisoprodol (SOMA) 350 MG tablet TAKE 1 TABLET BY MOUTH 4 TIMES DAILY FOR SEIZURE PREVENTION 120 tablet 5  . Cyanocobalamin (VITAMIN B-12 PO) Take 5,000 Units daily by mouth.     . divalproex (DEPAKOTE ER) 250 MG 24 hr tablet Take 2 tabs in AM, 3 tabs in PM 450 tablet 3  . eletriptan (RELPAX) 20 MG tablet Take 1 tablet as needed for migraine. Do not take more than 2-3 a week 10 tablet 5  . EPINEPHRINE 0.3 mg/0.3 mL IJ SOAJ injection INJECT 0.3 MLS (ONE SYRINGE) INTO THE MUSCLE ONCE FOR 1 DOSE AS NEEDED. (Patient taking differently: Inject 0.3 mg into the muscle once. ) 0.3 mL 0  . gabapentin (NEURONTIN) 300 MG capsule Take 4 capsules twice a day 720 capsule 3  . HYDROcodone-acetaminophen (NORCO) 10-325 MG tablet Take 1 tablet by mouth every 4 (four) hours as needed. 90 tablet 0  . levocetirizine (XYZAL) 5 MG tablet Take 1 tablet (5 mg total) by mouth every evening. 30 tablet 5  . LORazepam (ATIVAN) 1 MG tablet TAKE 1 TABLET BY MOUTH EVERY 8 HOURS AS NEEDED FOR ANXIETY 90 tablet 0  . montelukast (SINGULAIR) 10 MG tablet Take 1 tablet (10 mg total) by mouth at bedtime.  30 tablet 9  . promethazine (PHENERGAN) 25 MG tablet Take 25 mg every 6 (six) hours as needed by mouth for nausea or vomiting.     No current facility-administered medications for this visit.     PHYSICAL EXAMINATION: ECOG PERFORMANCE STATUS: 1 - Symptomatic but completely ambulatory  Vitals:   06/03/19 1250  BP: (!) 178/131  Pulse: (!) 105  Resp: 18  Temp: 98.3 F (36.8 C)  SpO2: 100%   Filed Weights   06/03/19 1250  Weight: 149 lb 12.8 oz (67.9 kg)    GENERAL:alert, no distress and comfortable NEURO: alert & oriented x 3   LABORATORY DATA:  I have reviewed the data as listed    Component Value Date/Time   NA 140 03/26/2018 2349   NA 140 06/15/2017 1207   K 3.6 03/26/2018 2349   K 3.9 06/15/2017 1207   CL 105 03/26/2018 2349   CO2 26 03/26/2018 2349   CO2 24  06/15/2017 1207   GLUCOSE 92 03/26/2018 2349   GLUCOSE 85 06/15/2017 1207   BUN 11 03/26/2018 2349   BUN 5.8 (L) 06/15/2017 1207   CREATININE 0.55 03/26/2018 2349   CREATININE 0.7 06/15/2017 1207   CALCIUM 9.1 03/26/2018 2349   CALCIUM 8.5 06/15/2017 1207   PROT 6.8 09/21/2017 1252   PROT 6.7 06/15/2017 1207   ALBUMIN 3.7 09/21/2017 1252   ALBUMIN 3.9 06/15/2017 1207   AST 17 09/21/2017 1252   AST 14 06/15/2017 1207   ALT 18 09/21/2017 1252   ALT 11 06/15/2017 1207   ALKPHOS 49 09/21/2017 1252   ALKPHOS 42 06/15/2017 1207   BILITOT 0.4 09/21/2017 1252   BILITOT 0.36 06/15/2017 1207   GFRNONAA >60 03/26/2018 2349   GFRAA >60 03/26/2018 2349    No results found for: SPEP, UPEP  Lab Results  Component Value Date   WBC 2.6 (L) 01/31/2019   NEUTROABS 1.2 (L) 01/31/2019   HGB 12.7 01/31/2019   HCT 37.6 01/31/2019   MCV 96.9 01/31/2019   PLT 230 01/31/2019      Chemistry      Component Value Date/Time   NA 140 03/26/2018 2349   NA 140 06/15/2017 1207   K 3.6 03/26/2018 2349   K 3.9 06/15/2017 1207   CL 105 03/26/2018 2349   CO2 26 03/26/2018 2349   CO2 24 06/15/2017 1207   BUN 11 03/26/2018 2349   BUN 5.8 (L) 06/15/2017 1207   CREATININE 0.55 03/26/2018 2349   CREATININE 0.7 06/15/2017 1207      Component Value Date/Time   CALCIUM 9.1 03/26/2018 2349   CALCIUM 8.5 06/15/2017 1207   ALKPHOS 49 09/21/2017 1252   ALKPHOS 42 06/15/2017 1207   AST 17 09/21/2017 1252   AST 14 06/15/2017 1207   ALT 18 09/21/2017 1252   ALT 11 06/15/2017 1207   BILITOT 0.4 09/21/2017 1252   BILITOT 0.36 06/15/2017 1207       RADIOGRAPHIC STUDIES: I have personally reviewed the radiological images as listed and agreed with the findings in the report. Dg Chest 1 View  Result Date: 05/21/2019 CLINICAL DATA:  05/04/2017 EXAM: CHEST  1 VIEW COMPARISON:  Removal of right-sided central venous port. No consolidation or effusion. Normal heart size. No pneumothorax. Mild scoliosis.  FINDINGS: The heart size and mediastinal contours are within normal limits. Both lungs are clear. The visualized skeletal structures are unremarkable. IMPRESSION: No active disease. Electronically Signed   By: Kim  Fujinaga M.D.   On: 05/21/2019 19:48     Dg Forearm Left  Result Date: 05/21/2019 CLINICAL DATA:  Fell off horse EXAM: LEFT FOREARM - 2 VIEW COMPARISON:  None. FINDINGS: There is no evidence of fracture or other focal bone lesions. Edema at the antecubital fossa. IMPRESSION: No acute osseous abnormality. Electronically Signed   By: Kim  Fujinaga M.D.   On: 05/21/2019 19:51   Dg Wrist Complete Left  Result Date: 05/21/2019 CLINICAL DATA:  Fell off horse EXAM: LEFT WRIST - COMPLETE 3+ VIEW COMPARISON:  None. FINDINGS: There is no evidence of fracture or dislocation. There is no evidence of arthropathy or other focal bone abnormality. Soft tissues are unremarkable. IMPRESSION: Negative. Electronically Signed   By: Kim  Fujinaga M.D.   On: 05/21/2019 19:52   Ct Head Wo Contrast  Result Date: 05/21/2019 CLINICAL DATA:  Fell from a horse.  History of glioblastoma. EXAM: CT HEAD WITHOUT CONTRAST CT CERVICAL SPINE WITHOUT CONTRAST TECHNIQUE: Multidetector CT imaging of the head and cervical spine was performed following the standard protocol without intravenous contrast. Multiplanar CT image reconstructions of the cervical spine were also generated. COMPARISON:  None. Brain MRI 06/01/2016 FINDINGS: CT HEAD FINDINGS Brain: The ventricles are in the midline without mass effect or shift. No extra-axial fluid collections are identified. The gray-white differentiation is maintained. Remote surgical changes from a prior right parietal frontal craniotomy. Encephalomalacia but no findings suspicious for recurrent tumor. The brainstem and cerebellum appear normal. Vascular: No hyperdense vessels or aneurysm. Skull: Right-sided craniotomy changes. No skull fracture or bone lesion. CT CERVICAL SPINE FINDINGS  Alignment: Mild reversal of the normal cervical lordosis. The vertebral bodies are normally aligned. Skull base and vertebrae: No acute fracture. No primary bone lesion or focal pathologic process. Soft tissues and spinal canal: No prevertebral fluid or swelling. No visible canal hematoma. Disc levels: The spinal canal is generous. No spinal or foraminal stenosis. The facets are normally aligned. No facet or laminar fractures. Upper chest: The lung apices are grossly clear. Other: No neck mass or adenopathy or hematoma. IMPRESSION: 1. Remote surgical changes from a right frontal parietal craniotomy with encephalomalacia. No findings suspicious for recurrent tumor. 2. No acute intracranial findings or skull fracture. 3. Mild reversal of the normal cervical lordosis but normal alignment of the vertebral bodies and no acute fracture. Electronically Signed   By: P.  Gallerani M.D.   On: 05/21/2019 19:33   Ct Cervical Spine Wo Contrast  Result Date: 05/21/2019 CLINICAL DATA:  Fell from a horse.  History of glioblastoma. EXAM: CT HEAD WITHOUT CONTRAST CT CERVICAL SPINE WITHOUT CONTRAST TECHNIQUE: Multidetector CT imaging of the head and cervical spine was performed following the standard protocol without intravenous contrast. Multiplanar CT image reconstructions of the cervical spine were also generated. COMPARISON:  None. Brain MRI 06/01/2016 FINDINGS: CT HEAD FINDINGS Brain: The ventricles are in the midline without mass effect or shift. No extra-axial fluid collections are identified. The gray-white differentiation is maintained. Remote surgical changes from a prior right parietal frontal craniotomy. Encephalomalacia but no findings suspicious for recurrent tumor. The brainstem and cerebellum appear normal. Vascular: No hyperdense vessels or aneurysm. Skull: Right-sided craniotomy changes. No skull fracture or bone lesion. CT CERVICAL SPINE FINDINGS Alignment: Mild reversal of the normal cervical lordosis. The  vertebral bodies are normally aligned. Skull base and vertebrae: No acute fracture. No primary bone lesion or focal pathologic process. Soft tissues and spinal canal: No prevertebral fluid or swelling. No visible canal hematoma. Disc levels: The spinal canal is generous. No spinal or foraminal stenosis. The   facets are normally aligned. No facet or laminar fractures. Upper chest: The lung apices are grossly clear. Other: No neck mass or adenopathy or hematoma. IMPRESSION: 1. Remote surgical changes from a right frontal parietal craniotomy with encephalomalacia. No findings suspicious for recurrent tumor. 2. No acute intracranial findings or skull fracture. 3. Mild reversal of the normal cervical lordosis but normal alignment of the vertebral bodies and no acute fracture. Electronically Signed   By: Marijo Sanes M.D.   On: 05/21/2019 19:33   Dg Shoulder Left  Result Date: 05/21/2019 CLINICAL DATA:  [place] off horse EXAM: LEFT SHOULDER - 2+ VIEW COMPARISON:  None. FINDINGS: No fracture or malalignment.  AC joint is intact. IMPRESSION: No acute osseous abnormality Electronically Signed   By: Donavan Foil M.D.   On: 05/21/2019 19:46   Dg Humerus Left  Result Date: 05/21/2019 CLINICAL DATA:  Fall with pain and swelling EXAM: LEFT HUMERUS - 2+ VIEW COMPARISON:  None. FINDINGS: There is no evidence of fracture or other focal bone lesions. Soft tissues are unremarkable. IMPRESSION: Negative. Electronically Signed   By: Donavan Foil M.D.   On: 05/21/2019 19:49   Dg Hand Complete Left  Result Date: 05/21/2019 CLINICAL DATA:  [place] off horse EXAM: LEFT HAND - COMPLETE 3+ VIEW COMPARISON:  None. FINDINGS: There is no evidence of fracture or dislocation. There is no evidence of arthropathy or other focal bone abnormality. Soft tissues are unremarkable. IMPRESSION: Negative. Electronically Signed   By: Donavan Foil M.D.   On: 05/21/2019 19:50    All questions were answered. The patient knows to call the clinic with  any problems, questions or concerns. No barriers to learning was detected.  I spent 55 minutes counseling the patient face to face. The total time spent in the appointment was 60 minutes and more than 50% was on counseling and review of test results  Heath Lark, MD 06/05/2019 8:36 AM

## 2019-06-05 NOTE — Assessment & Plan Note (Signed)
The patient has no evidence of cancer progression over the last 5 years We will continue at most once a year annual MRI She had negative evaluation in May 2020

## 2019-06-07 DIAGNOSIS — M25532 Pain in left wrist: Secondary | ICD-10-CM | POA: Diagnosis not present

## 2019-06-10 ENCOUNTER — Other Ambulatory Visit: Payer: Self-pay | Admitting: *Deleted

## 2019-06-10 DIAGNOSIS — M25532 Pain in left wrist: Secondary | ICD-10-CM | POA: Diagnosis not present

## 2019-06-10 NOTE — Patient Outreach (Signed)
[facility]) Care Management  06/10/2019  BEVELYN ALBER 08-25-1980 [id number]   Telephone Screen  Referral Date:  06/07/2019 Referral Source:  EMMI Prevent Reason for Referral:  Screening Insurance:  Medicare & Medicaid   Outreach Attempt:  Outreach attempt #1 to patient for telephone screening.  Patient stated she was unavailable and requested a call back.   Plan:  RN Health Coach will make another outreach attempt within the next 3-4 business days per patient request.   Hubert Azure RN [place] [phone] Tip [email or web address]

## 2019-06-11 ENCOUNTER — Other Ambulatory Visit: Payer: Self-pay | Admitting: *Deleted

## 2019-06-11 ENCOUNTER — Other Ambulatory Visit: Payer: Self-pay | Admitting: Hematology and Oncology

## 2019-06-11 DIAGNOSIS — R110 Nausea: Secondary | ICD-10-CM

## 2019-06-11 DIAGNOSIS — R519 Headache, unspecified: Secondary | ICD-10-CM

## 2019-06-11 DIAGNOSIS — R202 Paresthesia of skin: Secondary | ICD-10-CM

## 2019-06-11 DIAGNOSIS — C711 Malignant neoplasm of frontal lobe: Secondary | ICD-10-CM

## 2019-06-11 DIAGNOSIS — R0789 Other chest pain: Secondary | ICD-10-CM

## 2019-06-11 DIAGNOSIS — R4702 Dysphasia: Secondary | ICD-10-CM

## 2019-06-11 MED FILL — CARISOPRODOL 350 MG TABS: 350 | 90 days supply | Qty: 360 | Fill #0

## 2019-06-11 NOTE — Patient Outreach (Signed)
[facility]) Care Management  06/11/2019  HIMA LINNEY 1980/09/28 [id number]   Telephone Screen  Referral Date:  06/07/2019 Referral Source:  EMMI Prevent Reason for Referral:  Screening Insurance:  Medicare & Medicaid   Outreach Attempt:  Outreach attempt #2 to patient for telephone screening. No answer. RN Health Coach left HIPAA compliant voicemail message along with contact information.  Plan:  RN Health Coach will send unsuccessful outreach letter to patient.  RN Health Coach will make another outreach attempt to patient within 3-4 business days if no return call back from patient.  [place] [phone] [email or web address]

## 2019-06-12 ENCOUNTER — Other Ambulatory Visit: Payer: Self-pay | Admitting: Hematology and Oncology

## 2019-06-12 MED ORDER — BUTALBITAL-APAP-CAFFEINE 50-325-40 MG PO TABS: ORAL_TABLET | ORAL | 0 refills | Status: DC

## 2019-06-12 MED FILL — BUTALB-ACETAMIN-CAFF 50-325: 50-325-40 | 22 days supply | Qty: 90 | Fill #0

## 2019-06-17 ENCOUNTER — Telehealth: Payer: Self-pay

## 2019-06-17 ENCOUNTER — Other Ambulatory Visit: Payer: Self-pay | Admitting: Hematology and Oncology

## 2019-06-17 ENCOUNTER — Other Ambulatory Visit: Payer: Self-pay | Admitting: *Deleted

## 2019-06-17 DIAGNOSIS — C719 Malignant neoplasm of brain, unspecified: Secondary | ICD-10-CM

## 2019-06-17 MED ORDER — HYDROCODONE-ACETAMINOPHEN 10-325 MG PO TABS: 1.0000 | ORAL_TABLET | ORAL | 0 refills | Status: DC | PRN

## 2019-06-17 MED FILL — HYDROCODON-APAP 10-325: 10-325 | 15 days supply | Qty: 90 | Fill #0

## 2019-06-17 NOTE — Telephone Encounter (Signed)
[name] called and left a message. Requesting refill on Hydrocodone Rx. Ask that it be sent to [place].

## 2019-06-17 NOTE — Patient Outreach (Signed)
[facility]) Care Management  06/17/2019  LYNORE DAGHER 1980/11/26 [id number]   Telephone Screen  Referral Date:06/07/2019 Referral Source:EMMI Prevent Reason for Referral:Screening Insurance:Medicare & Medicaid   Outreach Attempt:  Outreach attempt #3 to patient for telephone screening. No answer. RN Health Coach left HIPAA compliant voicemail message along with contact information.  Plan:  RN Health Coach will close case if no return call from patient within 10 day time period from Unsuccessful Letter being mailed to patient.  [place] [phone] [email or web address]

## 2019-06-17 NOTE — Telephone Encounter (Signed)
done

## 2019-06-24 ENCOUNTER — Other Ambulatory Visit: Payer: Self-pay | Admitting: *Deleted

## 2019-06-24 NOTE — Patient Outreach (Signed)
[facility]) Care Management  06/24/2019  Victoria Bates 12-31-1980 [id number]   Telephone Screen/Case Closure  Referral Date:06/07/2019 Referral Source:EMMI Prevent Reason for Referral:Screening Insurance:Medicare & Medicaid   Outreach Attempt:  Multiple attempts to establish contact with patient without success. No response from letter mailed to patient. Case is being closed at this time.   Plan:  RN Health Coach will close case based on inability to establish contact with patient and make patient inactive with [facility].   [place] [phone] [email or web address]

## 2019-06-26 ENCOUNTER — Telehealth (INDEPENDENT_AMBULATORY_CARE_PROVIDER_SITE_OTHER): Payer: Medicare Other | Admitting: Neurology

## 2019-06-26 ENCOUNTER — Other Ambulatory Visit: Payer: Self-pay

## 2019-06-26 ENCOUNTER — Encounter: Payer: Self-pay | Admitting: Neurology

## 2019-06-26 VITALS — Ht 68.0 in | Wt 145.0 lb

## 2019-06-26 DIAGNOSIS — G40109 Localization-related (focal) (partial) symptomatic epilepsy and epileptic syndromes with simple partial seizures, not intractable, without status epilepticus: Secondary | ICD-10-CM | POA: Diagnosis not present

## 2019-06-26 DIAGNOSIS — G43709 Chronic migraine without aura, not intractable, without status migrainosus: Secondary | ICD-10-CM | POA: Diagnosis not present

## 2019-06-26 DIAGNOSIS — IMO0002 Reserved for concepts with insufficient information to code with codable children: Secondary | ICD-10-CM

## 2019-06-26 MED ORDER — NURTEC 75 MG PO TBDP: 1.0000 | ORAL_TABLET | ORAL | 11 refills | Status: DC | PRN

## 2019-06-26 NOTE — Progress Notes (Signed)
Virtual Visit via Video Note The purpose of this virtual visit is to provide medical care while limiting exposure to the novel coronavirus.    Consent was obtained for video visit:  Yes.   Answered questions that patient had about telehealth interaction:  Yes.   I discussed the limitations, risks, security and privacy concerns of performing an evaluation and management service by telemedicine. I also discussed with the patient that there may be a patient responsible charge related to this service. The patient expressed understanding and agreed to proceed.  Pt location: Home Physician Location: office Name of referring provider:  Lavone Orn, MD I connected with Victoria Bates at patients initiation/request on 06/26/2019 at  4:00 PM EST by video enabled telemedicine application and verified that I am speaking with the correct person using two identifiers. Pt MRN:  [id number] Pt DOB:  19-Jan-1981 Video Participants:  Victoria Bates   History of Present Illness:  The patient was seen as a virtual video visit on 06/26/2019. She was last seen 4 months ago in the neurology clinic for seizures. She has a history of right frontal oligodendroglioma s/p subtotal resection. Her last MRI brain in July 2020 showed stable appearance of treated right frontal oligodendroglioma. She reports continued seizures however has declined changing medications since she has been doing well and not feeling groggy. Prior prolonged EEG studies in the past have not captured seizure activity. She is on Depakote 250mg  in AM, 750mg  in PM and gabapentin 300mg  4 tabs in AM, 3 tabs in PM. She fell off a horse last month and injured her left elbow. She is not sure if she lost consciousness before or after the fall, but does not remember parts of it. Her entire arm was bruised and is currently in a split. She reports that on her last visit with Dr. Alvy Bimler, she has been told that she would need to find a different provider for her  chronic medications for pain. Notes were reviewed, and discussed with patient today, it appears she had misunderstood the visit and will discuss medication plan with Dr. Alvy Bimler. Per Dr. 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 I recommend neurology consult to manage her chronic headaches rather than her being on chronic narcotic prescription. Due to recent problem with narcotic prescription, I do not feel comfortable refilling her prescription of hydrocodone, Fioricet, Soma and lorazepam long-term without expert guidance from neurologist and pain specialist. I told the patient I do not plan to keep her on these medications indefinitely.Marland KitchenMarland KitchenRecently, because of her elbow problem, she was prescribed a different narcotic which she filled without letting me know until a few days later.  This action, unfortunately, violated our verbal narcotic refill policy which was discussed several times over the last 5 years. She also escalated and took more hydrocodone as prescribed which in the past we have warned her that this is not an appropriate action despite numerous discussion in the past. Ultimately, I gave the patient 60-day notice to find a new provider to refill all her medications On the other hand, I am willing to work with her to initiate narcotic prescription taper over many months. We have a standing appointment in January to discuss this further. If she is willing, I will be interested  to work with her to taper her hydrocodone off over the next 6 months followed by Fioricet off over a period of 4 months In addition, I will also initiate Soma taper over the course of 4 months and lorazepam over the course of 6 months. However, at the mention of medication taper, the  patient started crying and I am not able to make progress talking to her about this."  She states that she has been taking Soma for seizures, I discussed with her that this is not a medication used for seizures. She has tried different migraine rescue medications and states that Imitrex, Zomig, and Relpax make her feel weird with tingling. She reports that Botox had helped her migraines in the past. She has headaches on a daily basis.   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 She was admitted to [facility] last 09/19/2013 due to left vision changes and left facial heaviness. She reports that she had a 60-lb unintentional weight loss 4 months leading up to her admission, she was vomiting after every meal. She started having episodes where her hands would have low amplitude tremors and she feels the right lower face twitching. She would need to pause and take a minute to get words out. If she did not stop what she was doing, the symptoms could last for hours. She would feel like her body was "going 90 mph" with palpitations up to 200 bpm. She was started on Xanax for anxiety, but this did not help. She then started having vision changes on her left eye, she would park crookedly when she thought she was going straight. She saw an ophthalmologist, eye exam was normal but left optic neuritis was questioned and brain imaging recommended. She then started to have left facial heaviness and numbness on the left face, arm, and leg, and drove herself to [facility] where she was found to have a right frontal lesion. She underwent subtotal resection on 01/23/14 by Dr. Christella Noa and was started on Keppra for seizure prophylaxis at that time. She continued to have the recurrent episodes of tremors in both arms, palpitations, and right facial tightness, as well as episodes of left face, neck, shoulder, and  arm numbness, going down the left thumb and index finger. She describes her major seizures as "all of that times 100." After the episodes, she would be tired for a few days, wanting to sleep, with no energy. She has noticed that her short term memory is "gone." Keppra dose had been increased up to 2 grams daily, and this had caused a change in personality. She had seen neurologist Dr. Leonie Man, who switched her to Depakote. She tapered off Keppra and is currently on Depakote ER 500mg  BID with significant improvement in symptoms. She denies any side effects to the Depakote.   She has a history of recurrent headaches since her teenage years. Headaches are usually over the right temporal and occipital region, with sharp stabbing and throbbing pain, with a knot in her left neck region. There is occasional nausea, photo, and phonophobia. She was having these headaches around 1-2 times a week when younger, however since December 2014, headaches have been occurring around 2-3 times a week. She had been receiving injections in her neck for this. If she takes medication at the onset, they last a few hours, however they may last up to several days. She has  found that taking Decadron for a couple of days has been the only helpful medication. She takes Fioricet rarely. Zomig, aspirin, and Tylenol were ineffective in the past. Her father and 75 year old son have headaches. She denies any diplopia, dysarthria, dysphagia, bowel/bladder dysfunction. She has chronic back pain and had been taking Soma qid for several years, with worsening of pain when she tried reducing this. She was started on Gabapentin for the seizures and back pain, she is unsure if this helps. She had been taking Ultram for the pain, and once stopping this, she noticed an improvement in the seizures as well.   Diagnostic Data: Routine EEG done at [facility] reported amplitude asymmetry in the right frontotemporal regions which is likely from breach artifact from  prior craniotomy. There is intermittent right frontal sharp activity but no definite epileptiform activity. Impression: Mild right hemispheric focal cortical irritability but no definitive epileptiform activity is noted.  Her 48-hour EEG did not show any epileptiform discharges. It showed breach artifact consistent with prior surgery. She reported numerous symptoms including headache, severe muscle spasm under the base of her skull on the right side, tremors in both hands, jerking and pause in jaw (right side), left leg/inner thigh and left foot warm and pigmented, eyes twitching, fatigue, with no associated epileptiform correlate seen. Inpatient video EEG monitoring for 3 days at [facility], June 23 to 25, 2016: Depakote and Gabapentin were held. Typical events were not clearly captured, however she reported 2 spells where she reported dizziness and visual changes (tiles on floor looked uneven) with no EEG correlate. Baseline EEG showed breach rhythm over the right hemisphere, no epileptiform discharges or electrographic seizures.  I personally reviewed MRI brain with and without contrast done 03/13/14 which showed post-op right-sided craniotomy. The surgical cavity in the right frontal lobe is filled with fluid, with note of improvement in layering blood products since prior study. The study of 01/24/2014 showed restricted diffusion deep to the tumor. This is consistent with acute infarct which is resolving at this time. No acute infarct is present on today's study. No evidence of recurrent or residual tumor although the original tumor did not enhance. No mass effect or edema is present that would suggest recurrent tumor. Small amount of subarachnoid hemorrhage on the right again noted. MRI 11/2015 which I personally reviewed, there is similar pattern of enhancement and abnormal FLAIR signal in the right frontal gyrus immediately superior to the resection, stable from previous scan.   Epilepsy Risk Factors:  Right frontal oligodendroglioma s/p subtotal resection. Otherwise she had a normal birth and early development. There is no history of febrile convulsions, CNS infections such as meningitis/encephalitis, or family history of seizures.     Current Outpatient Medications on File Prior to Visit  Medication Sig Dispense Refill   carisoprodol (SOMA) 350 MG tablet TAKE 1 TABLET BY MOUTH 4 TIMES DAILY FOR SEIZURE PREVENTION 360 tablet 1   divalproex (DEPAKOTE ER) 250 MG 24 hr tablet Take 2 tabs in AM, 3 tabs in PM 450 tablet 3   eletriptan (RELPAX) 20 MG tablet Take 1 tablet as needed for migraine. Do not take more than 2-3 a week 10 tablet 5   EPINEPHRINE 0.3 mg/0.3 mL IJ SOAJ injection INJECT 0.3 MLS (ONE SYRINGE) INTO THE MUSCLE ONCE FOR 1 DOSE AS NEEDED. (Patient taking differently: Inject 0.3 mg into the muscle once. ) 0.3 mL 0   gabapentin (NEURONTIN) 300 MG capsule Take 4 capsules twice a day 720 capsule 3  HYDROcodone-acetaminophen (NORCO) 10-325 MG tablet Take 1 tablet by mouth every 4 (four) hours as needed. 90 tablet 0   levocetirizine (XYZAL) 5 MG tablet Take 1 tablet (5 mg total) by mouth every evening. 30 tablet 5   LORazepam (ATIVAN) 1 MG tablet TAKE 1 TABLET BY MOUTH EVERY 8 HOURS AS NEEDED FOR ANXIETY 90 tablet 0   montelukast (SINGULAIR) 10 MG tablet Take 1 tablet (10 mg total) by mouth at bedtime. 30 tablet 9   naproxen (NAPROSYN) 250 MG tablet Take by mouth 3 (three) times daily with meals.     promethazine (PHENERGAN) 25 MG tablet Take 25 mg every 6 (six) hours as needed by mouth for nausea or vomiting.     Cyanocobalamin (VITAMIN B-12 PO) Take 5,000 Units daily by mouth.      HYDROcodone-acetaminophen (NORCO) 10-325 MG tablet Take 1 tablet by mouth every 4 (four) hours as needed. 90 tablet 0   LORazepam (ATIVAN) 1 MG tablet Take 1 tablet (1 mg total) by mouth every 8 (eight) hours as needed for anxiety. 90 tablet 0   No current facility-administered medications on  file prior to visit.     Observations/Objective:   Vitals:   06/26/19 1427  Weight: 145 lb (65.8 kg)  Height: 5\' 8"  (1.727 m)   GEN:  The patient appears stated age and is in NAD, anxious  Neurological examination: Patient is awake, alert, oriented x 3. No aphasia or dysarthria. Intact fluency and comprehension. Remote and recent memory intact. Able to name and repeat. Cranial nerves: Extraocular movements intact with no nystagmus. No facial asymmetry. Motor: moves all extremities symmetrically except for left arm in sling.  Assessment and Plan:   This is a 38 yo RH woman with a history of grade 2 oligodengroglioma s/p subtotal resection on chemotherapy, anxiety, headaches, with recurrent episodes of low amplitude tremors, palpitations, and right facial tightening, as well as left face and arm numbness. She had a 48-hour EEG which did not show any epileptiform discharges. Her 3-day video EEG monitoring at [facility] interictally showed breach artifact, otherwise no epileptiform discharges. She did not have typical events and hence these could not be commented on. She was reporting an increase in seizures, and had another 72-hour EEG which did not show any epileptiform discharges, she had multiple push button events for dizziness, stuttering, headache, word-finding difficulties, with no EEG correlate. At times her heart rate went up to 150bpm during these events.   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 She continues to report seizures but does not want to change medications, we agreed to continue Depakote ER 250mg  2 tabs in AM, 3 tabs in PM and Gabapentin 300mg  4 tabs in AM, 3 tabs in PM. Unfortunately, she injured her left elbow and violated her narcotic contract with Dr. Alvy Bimler. Patient states she now needs a provider for her chronic daily narcotics, I discussed with her that our practice does not prescribe narcotics and she will have to see Pain Management. I discussed that  Manuela Neptune is not used for seizures, if she insists on this for seizures, EMU monitoring would be recommended where Manuela Neptune can be stopped and EEG assessed for seizure activity. I reviewed Dr. Calton Dach note with the patient, including plan for weaning off medications over the next several months. We discussed Botox for chronic migraines to hopefully help with getting her off chronic narcotic/Fioricet use. At this point Botox is indicated since she has more than 15 headache days a month with at least  8 of which have migrainous features lasting greater than 4 hours without treatment. She is aware of  driving laws to stop driving until 6 months seizure-free. Follow-up in 6 months, she knows to call for any changes.    Follow Up Instructions:   -I discussed the assessment and treatment plan with the patient. The patient was provided an opportunity to ask questions and all were answered. The patient agreed with the plan and demonstrated an understanding of the instructions.   The patient was advised to call back or seek an in-person evaluation if the symptoms worsen or if the condition fails to improve as anticipated.    Total Time spent in visit with the patient was:  25 minutes, of which more than 50% of the time was spent in counseling and/or coordinating care on the above.   Pt understands and agrees with the plan of care outlined.     Cameron Sprang, MD

## 2019-06-27 NOTE — Telephone Encounter (Signed)
Hi Dr. Alvy Bimler, The nurses are unable to "clean up" the refill inbasket and we cannot approve or refuse CS-II narcotics. Please refill or refuse as appropriate. Gardiner Rhyme

## 2019-06-28 MED ORDER — LORAZEPAM 1 MG PO TABS: 1.0000 mg | ORAL_TABLET | Freq: Three times a day (TID) | ORAL | 0 refills | Status: DC | PRN

## 2019-06-28 MED ORDER — HYDROCODONE-ACETAMINOPHEN 10-325 MG PO TABS: 1.0000 | ORAL_TABLET | ORAL | 0 refills | Status: DC | PRN

## 2019-06-28 MED FILL — LORAZEPAM 1 MG TABS: 1 | 30 days supply | Qty: 90 | Fill #0

## 2019-07-01 ENCOUNTER — Telehealth: Payer: Self-pay | Admitting: *Deleted

## 2019-07-01 DIAGNOSIS — M25522 Pain in left elbow: Secondary | ICD-10-CM | POA: Diagnosis not present
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
## 2019-07-01 NOTE — Telephone Encounter (Signed)
Thank you for letting me know I trust [facility] and I am sorry to hear what happened

## 2019-07-02 ENCOUNTER — Other Ambulatory Visit: Payer: Self-pay | Admitting: Orthopedic Surgery

## 2019-07-02 DIAGNOSIS — M25522 Pain in left elbow: Secondary | ICD-10-CM

## 2019-07-02 DIAGNOSIS — S52125A Nondisplaced fracture of head of left radius, initial encounter for closed fracture: Secondary | ICD-10-CM

## 2019-07-05 ENCOUNTER — Encounter: Payer: Self-pay | Admitting: *Deleted

## 2019-07-05 NOTE — Progress Notes (Addendum)
Submitted benefit verification and prior auth via [employer] website. Waiting for response.  PATIENT COVERAGE - MAJOR MEDICAL BENEFITS ICD Code(s) : G43.709   # of BOTOX Units: 155 BV Result PA/Pre-D Required Copay Coinsurance [id number] Covered - Per Insurer Guidelines  No $0.00 20% BOTOX (J0585) Buy and Bill Covered - Per Insurer Guidelines  No $0.00 20% BOTOX (J0585) Specialty Pharmacy Not Covered - Insurer Guidelines Not Met  Not Eligible      This is in chart form see fax sent to scan into media  

## 2019-07-08 ENCOUNTER — Telehealth: Payer: Self-pay

## 2019-07-08 ENCOUNTER — Other Ambulatory Visit: Payer: Self-pay | Admitting: Medical

## 2019-07-08 ENCOUNTER — Telehealth: Payer: Self-pay | Admitting: Neurology

## 2019-07-08 MED ORDER — BUTALBITAL-APAP-CAFFEINE 50-325-40 MG PO TABS: ORAL_TABLET | ORAL | 0 refills | Status: DC

## 2019-07-08 MED FILL — BUTALBITAL-APAP-CAFFEINE 50: 50-325-40 | 22 days supply | Qty: 90 | Fill #0

## 2019-07-08 NOTE — Telephone Encounter (Signed)
[name] called and requested a refill on Fioricet. Ask that it be sent to [facility].

## 2019-07-08 NOTE — Telephone Encounter (Signed)
Patient is asking about the PA for the Nurtec medication. [facility] pharm said they sent the form for this twice but wasn't sure if this has been started yet. Please update her. Thanks!

## 2019-07-09 NOTE — Telephone Encounter (Signed)
PA started through Covermymeds for Nurtec. Pts insurance is [facility]. Key [id number]. Messaged pt through Mychart to inform her.

## 2019-07-15 ENCOUNTER — Encounter: Payer: Self-pay | Admitting: *Deleted

## 2019-07-15 MED FILL — NURTEC 75 MG TBDP: 75 | 18 days supply | Qty: 8 | Fill #0

## 2019-07-15 NOTE — Progress Notes (Signed)
Received fax from [facility] ticket#[id number] Approved under Medicare Part D benefit  Approved 10tablets/ 30 days Valid 06/26/2019 until furhter notice Fax sent to scan in patient's chart

## 2019-07-16 ENCOUNTER — Ambulatory Visit
Admission: RE | Admit: 2019-07-16 | Discharge: 2019-07-16 | Disposition: A | Discharge status: Home / Self Care | Payer: Medicare Other | Source: Ambulatory Visit | Attending: Orthopedic Surgery | Admitting: Orthopedic Surgery

## 2019-07-16 ENCOUNTER — Other Ambulatory Visit: Payer: Self-pay

## 2019-07-16 ENCOUNTER — Other Ambulatory Visit: Payer: Self-pay | Admitting: Hematology and Oncology

## 2019-07-16 ENCOUNTER — Telehealth: Payer: Self-pay

## 2019-07-16 DIAGNOSIS — C719 Malignant neoplasm of brain, unspecified: Secondary | ICD-10-CM

## 2019-07-16 DIAGNOSIS — S52125A Nondisplaced fracture of head of left radius, initial encounter for closed fracture: Secondary | ICD-10-CM

## 2019-07-16 DIAGNOSIS — M25522 Pain in left elbow: Secondary | ICD-10-CM

## 2019-07-16 MED ORDER — HYDROCODONE-ACETAMINOPHEN 10-325 MG PO TABS: 1.0000 | ORAL_TABLET | ORAL | 0 refills | Status: DC | PRN

## 2019-07-16 MED ORDER — IOPAMIDOL (ISOVUE-M 200) INJECTION 41%
4.0000 mL | Freq: Once | INTRAMUSCULAR | Status: AC
  Administered 2019-07-16: 4 mL via INTRA_ARTICULAR

## 2019-07-16 MED FILL — HYDROCODON-APAP 10-325: 10-325 | 15 days supply | Qty: 90 | Fill #0

## 2019-07-16 NOTE — Telephone Encounter (Signed)
Called and given below message. She verbalized understanding. She is agreeable to reschedule appt. Scheduling message sent.

## 2019-07-16 NOTE — Telephone Encounter (Signed)
Done For her appt on 1/18, can we move to 1/15? If OK, please send scheduling msg for labs and see me 30 mins

## 2019-07-16 NOTE — Telephone Encounter (Signed)
She called and left a message requesting refill on Hydrocodone. Ask that it be sent to [facility] outpatient pharmacy.

## 2019-07-17 ENCOUNTER — Encounter: Payer: Self-pay | Admitting: Hematology and Oncology

## 2019-07-18 ENCOUNTER — Telehealth: Payer: Self-pay | Admitting: Hematology and Oncology

## 2019-07-18 NOTE — Telephone Encounter (Signed)
Scheduled appt per 12/29 sch message - pt is aware of appt date and time  

## 2019-07-26 ENCOUNTER — Inpatient Hospital Stay: Payer: Medicare Other | Attending: Hematology and Oncology | Admitting: Hematology and Oncology

## 2019-07-26 ENCOUNTER — Encounter: Payer: Self-pay | Admitting: Hematology and Oncology

## 2019-07-26 ENCOUNTER — Telehealth: Payer: Self-pay | Admitting: Hematology and Oncology

## 2019-07-26 DIAGNOSIS — C719 Malignant neoplasm of brain, unspecified: Secondary | ICD-10-CM

## 2019-07-26 DIAGNOSIS — D61818 Other pancytopenia: Secondary | ICD-10-CM

## 2019-07-26 DIAGNOSIS — G40109 Localization-related (focal) (partial) symptomatic epilepsy and epileptic syndromes with simple partial seizures, not intractable, without status epilepticus: Secondary | ICD-10-CM | POA: Diagnosis not present

## 2019-07-26 DIAGNOSIS — T451X5A Adverse effect of antineoplastic and immunosuppressive drugs, initial encounter: Secondary | ICD-10-CM

## 2019-07-26 DIAGNOSIS — Z7189 Other specified counseling: Secondary | ICD-10-CM | POA: Diagnosis not present

## 2019-07-26 DIAGNOSIS — C711 Malignant neoplasm of frontal lobe: Secondary | ICD-10-CM | POA: Insufficient documentation

## 2019-07-26 DIAGNOSIS — G44021 Chronic cluster headache, intractable: Secondary | ICD-10-CM | POA: Insufficient documentation

## 2019-07-26 DIAGNOSIS — E559 Vitamin D deficiency, unspecified: Secondary | ICD-10-CM | POA: Diagnosis not present

## 2019-07-26 DIAGNOSIS — D61810 Antineoplastic chemotherapy induced pancytopenia: Secondary | ICD-10-CM | POA: Diagnosis not present

## 2019-07-26 DIAGNOSIS — E538 Deficiency of other specified B group vitamins: Secondary | ICD-10-CM

## 2019-07-26 DIAGNOSIS — Z79899 Other long term (current) drug therapy: Secondary | ICD-10-CM | POA: Diagnosis not present

## 2019-07-26 MED ORDER — LORAZEPAM 1 MG PO TABS: 1.0000 mg | ORAL_TABLET | Freq: Two times a day (BID) | ORAL | 0 refills | Status: DC | PRN

## 2019-07-26 MED ORDER — HYDROCODONE-ACETAMINOPHEN 10-325 MG PO TABS: 1.0000 | ORAL_TABLET | ORAL | 0 refills | Status: DC | PRN

## 2019-07-26 MED ORDER — BUTALBITAL-APAP-CAFFEINE 50-325-40 MG PO TABS: ORAL_TABLET | ORAL | 0 refills | Status: DC

## 2019-07-26 MED FILL — BUTALBITAL-APAP-CAFFEINE 50: 50-325-40 | 22 days supply | Qty: 90 | Fill #0

## 2019-07-26 NOTE — Assessment & Plan Note (Signed)
The patient has no evidence of cancer progression over the last 5 years We will continue at most once a year annual MRI She had negative evaluation in May 2020, so her next MRI would probably be around May of this year

## 2019-07-26 NOTE — Assessment & Plan Note (Signed)
The patient has been prescribed Soma and lorazepam by me over the last few years due to insurance issues She has recurrent seizures since her surgery I am concerned about tapering her off medications too quickly that may precipitate seizure and at the same time I am not comfortable prescribing these medications long-term I will consult with a neurologist to take over those prescriptions or defer to her neurologist to prescribe something else to manage her seizures For now, she is in agreement to reduce lorazepam from every 8 hours as needed to twice a day as needed and to keep Soma as prescribed Again, I plan to taper her off very slowly over the next few months and I will discuss this with her neurologist

## 2019-07-26 NOTE — Assessment & Plan Note (Signed)
I have extensive discussions with the patient and her husband about chronic headache management I specifically requested neurologist to take over her chronic headache management I have told the patient in the past I am not comfortable prescribing chronic long-term narcotics and Fioricet for headache management which is inappropriate The patient appears to be ready to start slow taper of chronic long-term medications For now, I recommend we reduce hydrocodone from maximum 6 tablets/day as needed to 5 tablets/day as needed and to keep Fioricet as prescribed I will try to alternate between hydrocodone and Fioricet slow taper monthly over the next few months She is scheduled to get Botox injection next month in an attempt to treat her headache and I will schedule another visit with her after that She is in agreement with the plan of care

## 2019-07-26 NOTE — Progress Notes (Signed)
HEMATOLOGY-ONCOLOGY ELECTRONIC VISIT PROGRESS NOTE  Patient Care Team: Lavone Orn, MD as PCP - General (Internal Medicine) Ashok Pall, MD as Consulting Physician (Neurosurgery) Milas Gain., MD (Inactive) as Consulting Physician (Neurosurgery) Milas Gain., MD (Inactive) as Consulting Physician (Neurosurgery) Bobbitt, Sedalia Muta, MD as Consulting Physician (Allergy and Immunology) Cameron Sprang, MD as Consulting Physician (Neurology)  I connected with by [facility] video conference and verified that I am speaking with the correct person using two identifiers.  I discussed the limitations, risks, security and privacy concerns of performing an evaluation and management service by [facility] and the availability of in person appointments.  I also discussed with the patient that there may be a patient responsible charge related to this service. The patient expressed understanding and agreed to proceed.   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 The patient has no evidence of cancer progression over the last 5 years We will continue at most once a year annual MRI She had negative evaluation in May 2020, so her next MRI would probably be around May of this year  Intractable chronic cluster headache I have extensive discussions with the patient and her husband about chronic headache management I specifically requested neurologist to take over her chronic headache management I have told the patient in the past I am not comfortable prescribing chronic long-term narcotics and Fioricet for headache management which is inappropriate The patient appears to be ready to start slow taper of chronic long-term medications For now, I recommend we reduce hydrocodone from maximum 6 tablets/day as needed to 5 tablets/day as needed and to keep Fioricet as prescribed I will try to alternate between hydrocodone and  Fioricet slow taper monthly over the next few months She is scheduled to get Botox injection next month in an attempt to treat her headache and I will schedule another visit with her after that She is in agreement with the plan of care  Simple partial seizures The patient has been prescribed Soma and lorazepam by me over the last few years due to insurance issues She has recurrent seizures since her surgery I am concerned about tapering her off medications too quickly that may precipitate seizure and at the same time I am not comfortable prescribing these medications long-term I will consult with a neurologist to take over those prescriptions or defer to her neurologist to prescribe something else to manage her seizures For now, she is in agreement to reduce lorazepam from every 8 hours as needed to twice a day as needed and to keep Soma as prescribed Again, I plan to taper her off very slowly over the next few months and I will discuss this with her neurologist  Goals of care, counseling/discussion The patient might have misunderstood the intent of our previous meeting Ultimately, after very prolonged discussion today, she is in agreement to initiate slow taper of medications and I will plan to see her on a monthly basis I will also collaborate the care with her neurologist over some of these medication changes.  Pancytopenia, acquired ([place]) She has chronic pancytopenia, multifactorial She was found to have vitamin B12 deficiency in the past I recommend she resume taking high-dose oral vitamin B12 replacement therapy and I plan to recheck CBC and B12 level in her next visit  Vitamin D deficiency She had history of vitamin D deficiency I plan to recheck vitamin D level next visit   Orders Placed  This Encounter  Procedures  . CBC with Differential    Standing Status:   Future    Standing Expiration Date:   08/29/2020  . Vitamin B12    Standing Status:   Future    Standing Expiration  Date:   08/29/2020  . Vitamin D 25 hydroxy    Standing Status:   Future    Standing Expiration Date:   07/25/2020    INTERVAL HISTORY: Please see below for problem oriented charting. The patient is seen through virtual visit today.  Her husband is available The patient has expressed concerns over our last visit and she felt that I was about to discharge her from my office I tried to explain to the patient was not the intent of the conversation Rather, the intent would be to try to wean her off chronic narcotic prescription that was prescribed for headache treatment as well as other medication such as Soma and lorazepam that may have impact on seizure control Since last time I saw her, she is doing well overall She saw her neurologist recently with some medication changes along with scheduled Botox injection in an attempt to treat her headaches No recent infection, fever or chills She admits that she is taking less narcotic prescription and less lorazepam recently. 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   09/27/2013 Pathology Results   Accession: [id number] pathology showed grade 2 oligodendroglioma with mutant IDH1 protein, ATRX, OLIG 2 expression. p53 is negative, low Ki-67 labeling index. Molecular SNP array studies demonstrate whole arm 1p19q codeletion."   10/28/2013 Imaging   Ct scan of head showed possible abscess.   11/06/2013 Imaging   Repeat CT scan of the head showed resolution of abscess.   01/23/2014 Surgery   Dr. Christella Noa perform a subtotal gross resection of the tumor.   01/24/2014 Imaging   Repeat imaging studies showed persistent and residual disease.   03/03/2014 - 09/01/2014 Chemotherapy    She was started on Temodar, 5 days every 28 days   03/13/2014 Imaging   Repeat MRI showed no evidence of progression of disease.   07/14/2014 Imaging   Repeat MRI showed no evidence of disease progression.   09/12/2014 Imaging   MRI of the liver show mild regression of the size of lesions, presumed to be benign   09/15/2014 Imaging   Repeat MRI of the brain show persistent abnormalities, suspicious for residual disease   03/02/2015 Imaging    MRI brain at [facility] show persistent abnormalities   08/10/2015 Imaging   Repeat MRI [facility] showed stable disease   08/24/2015 - 02/08/2016 Chemotherapy   She was started on Lomustine, Procarbazine and Vincristine chemotherapy   08/30/2015 Imaging   CT chest showed no evidence of aspiration pneumonia   11/18/2015 Imaging   MRI brain showed status post resection of right frontal lobe tumor without residual or recurrent tumor. 2. Otherwise negative MRI of the brain.    03/02/2016 Imaging   Stable MRI post right frontal lobe tumor resection. Negative for recurrent tumor   06/01/2016 Imaging   Stable MRI post right frontal lobe tumor resection. No recurrent tumor identified   08/19/2016 Imaging   Unchanged examination without tumor recurrence.   11/17/2016 Imaging   Stable right frontal resection cavity.  Stable focal mass effect within the right middle frontal gyrus at the superior margin of the cavity which may represent nonenhancing neoplasm. No evidence for tumor progression. Otherwise stable MRI of the brain   12/16/2016 Imaging   MR brain: 1. No acute intracranial abnormality. 2. Unchanged appearance of right frontal lobe resection cavity and surrounding hyperintense T2 weighted signal.   02/04/2017 Imaging   Repeat MRI Normal pituitary  Stable postop changes of right frontal tumor resection   05/12/2017 Imaging   The left ventricular size is normal. There is normal left ventricular wall thickness.  Left ventricular systolic function is normal.  LV ejection fraction = 60-65%.  Left ventricular filling pattern is normal. The left ventricular wall motion is normal. The right ventricle is normal in size and function. There is no significant valvular stenosis or regurgitation No pulmonary hypertension. IVC size was normal. There is no pericardial effusion. There is no comparison study available   05/13/2017 Imaging   1.No evidence of acute pulmonary thromboembolic disease. 2.There is an 8 mm right lower lobe groundglass nodule. Statistically, in this age group, this is most likely to reflect benign etiologies such as a tiny focus of infection or nonspecific inflammation. 4 mm solid right upper lobe lung nodule. A CT scan could be obtained in 12 months to further evaluate if the patient has any known risk factors for malignancy. 3.There is a 1.3 cm left adrenal nodule. This finding is incompletely characterized although and is slightly increased in size relative to the abdominal MRI from February 2016. Although this finding is most likely benign, recommend follow-up adrenal protocol CT in one year.   05/13/2017 Imaging   MRI brain 1.No acute intracranial abnormality. 2.Postsurgical changes of a right frontal craniotomy for resection of a reported oligodendroglioma. Hyperintense T2/FLAIR signal around the resection cavity and in the subcortical white matter of a gyrus along the superior margin of the cavity is unchanged from 01/05/2015. No evidence of disease progression.   12/15/2017 Imaging   MRI brain at [facility] 1.No acute intracranial abnormality. 2.Postsurgical changes of right frontal craniotomy for resection of reported oligodendroglioma. No evidence of disease progression   12/28/2017 Procedure   Successful right IJ vein Port-A-Cath explant.   04/24/2018 Imaging   Stable post treatment appearance, RIGHT frontal oligodendroglioma.  No posttraumatic sequelae are evident.   01/30/2019 Imaging   Stable  appearance of treated right frontal oligodendroglioma. No change from 04/24/2018.     REVIEW OF SYSTEMS:   Constitutional: Denies fevers, chills or abnormal weight loss Eyes: Denies blurriness of vision Ears, nose, mouth, throat, and face: Denies mucositis or sore throat Respiratory: Denies cough, dyspnea or wheezes Cardiovascular: Denies palpitation, chest discomfort Gastrointestinal:  Denies nausea, heartburn or change in bowel habits Skin: Denies abnormal skin rashes Lymphatics: Denies new lymphadenopathy or easy bruising Behavioral/Psych: Mood is stable, no new changes  Extremities: No lower extremity edema All other systems were reviewed with the patient and are negative.  I have reviewed the past medical history, past surgical history, social history and family history with the patient and they are unchanged from previous note.  ALLERGIES:  is allergic to adhesive [tape]; latex; medical adhesive remover; percocet [oxycodone-acetaminophen]; shellfish allergy; soma [carisoprodol]; tramadol hcl; vancomycin; and zofran [ondansetron hcl].  MEDICATIONS:  Current Outpatient Medications  Medication Sig Dispense Refill  . butalbital-acetaminophen-caffeine (FIORICET) 50-325-40 MG tablet TAKE 1 TABLET BY MOUTH EVERY 6 HOURS AS NEEDED FOR HEADACHE 90 tablet 0  . carisoprodol (SOMA) 350 MG tablet TAKE 1  TABLET BY MOUTH 4 TIMES DAILY FOR SEIZURE PREVENTION 360 tablet 1  . Cyanocobalamin (VITAMIN B-12 PO) Take 5,000 Units daily by mouth.     . divalproex (DEPAKOTE ER) 250 MG 24 hr tablet Take 2 tabs in AM, 3 tabs in PM 450 tablet 3  . eletriptan (RELPAX) 20 MG tablet Take 1 tablet as needed for migraine. Do not take more than 2-3 a week 10 tablet 5  . EPINEPHRINE 0.3 mg/0.3 mL IJ SOAJ injection INJECT 0.3 MLS (ONE SYRINGE) INTO THE MUSCLE ONCE FOR 1 DOSE AS NEEDED. (Patient taking differently: Inject 0.3 mg into the muscle once. ) 0.3 mL 0  . gabapentin (NEURONTIN) 300 MG capsule Take 4 capsules  twice a day 720 capsule 3  . HYDROcodone-acetaminophen (NORCO) 10-325 MG tablet Take 1 tablet by mouth every 5 (five) hours as needed for up to 5 doses. 90 tablet 0  . levocetirizine (XYZAL) 5 MG tablet Take 1 tablet (5 mg total) by mouth every evening. 30 tablet 5  . LORazepam (ATIVAN) 1 MG tablet Take 1 tablet (1 mg total) by mouth 2 (two) times daily as needed for anxiety. 90 tablet 0  . montelukast (SINGULAIR) 10 MG tablet Take 1 tablet (10 mg total) by mouth at bedtime. 30 tablet 9  . naproxen (NAPROSYN) 250 MG tablet Take by mouth 3 (three) times daily with meals.    . Rimegepant Sulfate (NURTEC) 75 MG TBDP Take 1 tablet by mouth as needed (Take 1 tablet as needed for migraine. Do not take more than 2-3 times a week). 10 tablet 11   No current facility-administered medications for this visit.    PHYSICAL EXAMINATION: ECOG PERFORMANCE STATUS: 1 - Symptomatic but completely ambulatory  LABORATORY DATA:  I have reviewed the data as listed CMP Latest Ref Rng & Units 03/26/2018 09/21/2017 08/10/2017  Glucose 70 - 99 mg/dL 92 88 89  BUN 6 - 20 mg/dL _0 Creatinine 0.44 - 1.00 mg/dL 0.55 0.71 0.68  Sodium 135 - 145 mmol/L 140 139 138  Potassium 3.5 - 5.1 mmol/L 3.6 4.0 4.3  Chloride 98 - 111 mmol/L 105 104 106  CO2 22 - 32 mmol/L _1 Calcium 8.9 - 10.3 mg/dL 9.1 9.5 8.6  Total Protein 6.4 - 8.3 g/dL - 6.8 6.6  Total Bilirubin 0.2 - 1.2 mg/dL - 0.4 <0.2(L)  Alkaline Phos 40 - 150 U/L - 49 46  AST 5 - 34 U/L - 17 12  ALT 0 - 55 U/L - 18 9    Lab Results  Component Value Date   WBC 2.6 (L) 01/31/2019   HGB 12.7 01/31/2019   HCT 37.6 01/31/2019   MCV 96.9 01/31/2019   PLT 230 01/31/2019   NEUTROABS 1.2 (L) 01/31/2019     RADIOGRAPHIC STUDIES: I have personally reviewed the radiological images as listed and agreed with the findings in the report. CT ELBOW LEFT W CONTRAST  Result Date: 07/16/2019 CLINICAL DATA:  Evaluate elbow injury. EXAM: CT ARTHROGRAPHY OF THE left  elbow TECHNIQUE: Multidetector CT imaging was performed following the standard protocol after injection of dilute contrast into the joint. COMPARISON:  Left elbow MRI 06/01/2019 FINDINGS: The elbow joint is maintained. No acute fractures are identified. Normal appearance of the articular cartilage. No cartilage defects or osteochondral lesions. No loose bodies are identified in the joint. No extravasation of contrast to suggest rupture joint capsule or torn radial and ulnar collateral ligaments. Soft tissue detail is not great with  CT but I do not see any obvious findings for ruptured common flexor and extensor tendons. The biceps and triceps tendons are intact. IMPRESSION: 1. No acute bony findings or osteochondral lesion. 2. Intact articular cartilage. 3. No extravasation of contrast to suggest capsular disruption or ulna or radial collateral ligament tear. 4. Grossly intact major tendons. Electronically Signed   By: Marijo Sanes M.D.   On: 07/16/2019 17:26   DG FLUORO GUIDED NEEDLE PLC ASPIRATION/INJECTION LOC  Result Date: 07/16/2019 CLINICAL DATA:  Left elbow pain. EXAM: LEFT ELBOW INJECTION UNDER FLUOROSCOPY FLUOROSCOPY TIME:  Fluoroscopy Time:  1 second Radiation Exposure Index (if provided by the fluoroscopic device): 1.09 microGray*m^2 Number of Acquired Spot Images: 0 PROCEDURE: Overlying skin prepped with Betadine and draped in the usual sterile fashion. A 25 gauge needle was advanced into the left elbow joint via a posterior approach targeting the olecranon fossa. 1 mL of 1% lidocaine injected easily. A mixture of 15 mL of Isovue-M 200 and 5 mL of sterile saline was then injected with contrast seen to flow freely throughout the elbow joint. 4 mL of this mixture were injected with adequate distention of the joint fluoroscopically. There was no immediate complication. IMPRESSION: Technically successful left elbow injection under fluoroscopy for CT arthrogram. Electronically Signed   By: Logan Bores  M.D.   On: 07/16/2019 17:14    I discussed the assessment and treatment plan with the patient. The patient was provided an opportunity to ask questions and all were answered. The patient agreed with the plan and demonstrated an understanding of the instructions. The patient was advised to call back or seek an in-person evaluation if the symptoms worsen or if the condition fails to improve as anticipated.    I spent 55 minutes on review of test results and plan of care  Heath Lark, MD 07/26/2019 4:33 PM

## 2019-07-26 NOTE — Telephone Encounter (Signed)
Scheduled per 1/8 sch msg. Called and spoke with pt, confirmed 2/15 and 2/16 appt

## 2019-07-26 NOTE — Assessment & Plan Note (Signed)
She has chronic pancytopenia, multifactorial She was found to have vitamin B12 deficiency in the past I recommend she resume taking high-dose oral vitamin B12 replacement therapy and I plan to recheck CBC and B12 level in her next visit

## 2019-07-26 NOTE — Assessment & Plan Note (Signed)
She had history of vitamin D deficiency I plan to recheck vitamin D level next visit

## 2019-07-26 NOTE — Assessment & Plan Note (Signed)
The patient might have misunderstood the intent of our previous meeting Ultimately, after very prolonged discussion today, she is in agreement to initiate slow taper of medications and I will plan to see her on a monthly basis I will also collaborate the care with her neurologist over some of these medication changes.

## 2019-07-29 MED FILL — DIVALPROEX SOD ER 250 MG TA: 250 | 90 days supply | Qty: 450 | Fill #1

## 2019-07-29 MED FILL — LEVOCETIRIZINE 5 MG TABLET: 5 | 30 days supply | Qty: 30 | Fill #4

## 2019-07-29 MED FILL — GABAPENTIN 300 MG CAPSULE: 300 | 90 days supply | Qty: 720 | Fill #2

## 2019-07-30 DIAGNOSIS — M25522 Pain in left elbow: Secondary | ICD-10-CM | POA: Diagnosis not present

## 2019-08-01 ENCOUNTER — Telehealth: Payer: Self-pay | Admitting: *Deleted

## 2019-08-01 NOTE — Telephone Encounter (Signed)
Patient's husband called to have the Hydrocodone refilled at [facility] please

## 2019-08-02 ENCOUNTER — Other Ambulatory Visit: Payer: Self-pay | Admitting: Hematology and Oncology

## 2019-08-02 ENCOUNTER — Other Ambulatory Visit: Payer: Medicare Other

## 2019-08-02 ENCOUNTER — Telehealth: Payer: Medicare Other | Admitting: Hematology and Oncology

## 2019-08-02 DIAGNOSIS — C719 Malignant neoplasm of brain, unspecified: Secondary | ICD-10-CM

## 2019-08-02 MED ORDER — HYDROCODONE-ACETAMINOPHEN 10-325 MG PO TABS: 1.0000 | ORAL_TABLET | ORAL | 0 refills | Status: DC | PRN

## 2019-08-02 MED FILL — HYDROCODON-APAP 10-325: 10-325 | 19 days supply | Qty: 90 | Fill #0

## 2019-08-02 NOTE — Telephone Encounter (Signed)
done

## 2019-08-04 DIAGNOSIS — Z20828 Contact with and (suspected) exposure to other viral communicable diseases: Secondary | ICD-10-CM | POA: Diagnosis not present

## 2019-08-04 DIAGNOSIS — R0602 Shortness of breath: Secondary | ICD-10-CM | POA: Diagnosis not present

## 2019-08-05 ENCOUNTER — Other Ambulatory Visit: Payer: Medicare Other

## 2019-08-05 ENCOUNTER — Ambulatory Visit: Payer: Medicare Other | Admitting: Hematology and Oncology

## 2019-08-06 ENCOUNTER — Encounter: Payer: Self-pay | Admitting: *Deleted

## 2019-08-06 ENCOUNTER — Telehealth: Payer: Self-pay | Admitting: Pulmonary Disease

## 2019-08-06 ENCOUNTER — Telehealth: Payer: Self-pay

## 2019-08-06 DIAGNOSIS — R0602 Shortness of breath: Secondary | ICD-10-CM | POA: Diagnosis not present

## 2019-08-06 DIAGNOSIS — Z20828 Contact with and (suspected) exposure to other viral communicable diseases: Secondary | ICD-10-CM | POA: Diagnosis not present

## 2019-08-06 MED ORDER — ALBUTEROL SULFATE (2.5 MG/3ML) 0.083% IN NEBU: INHALATION_SOLUTION | RESPIRATORY_TRACT | 3 refills | Status: DC

## 2019-08-06 MED FILL — ALBUTEROL 0.083 MG/ML SOLN: (2.5 MG/3ML | 5 days supply | Qty: 90 | Fill #0

## 2019-08-06 NOTE — Telephone Encounter (Signed)
We can refill Albuterol. Please get her an apt with respiratory clinic. If nothing available today or tomorrow then needs virtual visit with MD or App

## 2019-08-06 NOTE — Telephone Encounter (Signed)
Called spoke with patient's spouse [name] at [employer] that with patient's COVID diagnosis we are unable to see her here in the office and that the 1st step in caring for her would be either 1- a virtual visit with this office or 2- get her in the with respiratory clinic.  [name] aware that the respiratory clinic will be able to do a cxr.  Patient was on speaker phone and was coughing almost continuously.  Per [name], patient's cough is really only that constant when she is up moving around and typically has been dry but yesterday began producing clear mucus.  Advised to try small sips of cold water to help soothe the throat and decrease urge to cough.  Patient and [name] okay with scheduling respiratory clinic appt - clinic not open today but appt scheduled for 1830 tomorrow 08/07/19.  Patient would like to hold off on virtual visit with this office for now as she is concerned 2 appts will be taxing.  Advised patient that if her symptoms worsen or she decides she would like an appt to please contact the office.  Albuterol neb sent to verified pharmacy  Patient stated that she has also been on Budesonide in the past (no evidence of this from Pulmonary's office notes) and is requesting a refill on this as well.  Did advise patient that this may not be authorized without a recent visit but she requested I ask anyway.  Beth please advise, thank you.  **PLEASE RESPOND VIA MYCHART PER PT'S REQUEST**

## 2019-08-06 NOTE — Telephone Encounter (Signed)
I have sent a MyChart message to the pt per her request in [facility], [facility] documentation. Nothing further was needed at this time.

## 2019-08-06 NOTE — Telephone Encounter (Signed)
Victoria Bates called and left a message on 08/05/19 at 4:12 pm. Victoria Bates tested positive for COVID on Sunday. [name]/ [name]  is requesting that you order a chest x-ray for inflammation, she has the history of asthma.

## 2019-08-06 NOTE — Telephone Encounter (Signed)
Spoke with pt's husband, [name]. States that the pt has tested positive for COVID on Sunday. Reports that her oncologist advised them to call us because the pt's symptoms are getting worse. She is having lots of shortness of breath, coughing and chest tightness. Her oncologist said that she would need a CXR and that she would need to come here. Everardo All that we are not allowing COVID positive pts in our office. While on the phone, pt asked if we could give her a prescription for albuterol nebs to use. We have not seen the pt since August 2019, advised her that a provider would have to authorize this refill.  Beth - please advise. Thanks.

## 2019-08-06 NOTE — Telephone Encounter (Signed)
Unfortunately I can not see a prescription for this medication and does not appear that we prescribed it. Would need virtual visit with [place]. Otherwise, continue Albuterol nebulizer every 4-6 hours for shortness of breath/wheezing and keep apt with respiratory clinic tomorrow. Do we have verification for positive covid test?

## 2019-08-06 NOTE — Telephone Encounter (Signed)
Let's refer her to respiratory clinic

## 2019-08-06 NOTE — Telephone Encounter (Signed)
Called and spoke with Victoria Bates. Given below message. Offered referral to respiratory clinic. He declined and [name] declined. Victoria Bates will call another MD, she has seen a pulmonologist in the past. Instructed to call the office back in needed and go to ER for worsening symptoms. Victoria Bates verbalized understanding.

## 2019-08-07 ENCOUNTER — Encounter: Payer: Self-pay | Admitting: Family Medicine

## 2019-08-07 ENCOUNTER — Other Ambulatory Visit: Payer: Self-pay | Admitting: Family Medicine

## 2019-08-07 ENCOUNTER — Ambulatory Visit (INDEPENDENT_AMBULATORY_CARE_PROVIDER_SITE_OTHER): Payer: Medicare Other | Admitting: Family Medicine

## 2019-08-07 VITALS — BP 118/78 | HR 93 | Temp 97.7°F | Resp 12 | Ht 69.0 in | Wt 151.6 lb

## 2019-08-07 DIAGNOSIS — R05 Cough: Secondary | ICD-10-CM

## 2019-08-07 DIAGNOSIS — R053 Chronic cough: Secondary | ICD-10-CM

## 2019-08-07 DIAGNOSIS — U071 COVID-19: Secondary | ICD-10-CM | POA: Diagnosis not present

## 2019-08-07 DIAGNOSIS — R0602 Shortness of breath: Secondary | ICD-10-CM

## 2019-08-07 DIAGNOSIS — D61818 Other pancytopenia: Secondary | ICD-10-CM | POA: Diagnosis not present

## 2019-08-07 MED ORDER — BUDESONIDE 0.25 MG/2ML IN SUSP: 0.2500 mg | Freq: Four times a day (QID) | RESPIRATORY_TRACT | 12 refills | Status: DC | PRN

## 2019-08-07 MED ORDER — METHYLPREDNISOLONE SODIUM SUCC 125 MG IJ SOLR: 80.0000 mg | Freq: Once | INTRAMUSCULAR | Status: DC

## 2019-08-07 MED ORDER — AZITHROMYCIN 250 MG PO TABS: ORAL_TABLET | ORAL | 0 refills | Status: DC

## 2019-08-07 MED ORDER — BENZONATATE 100 MG PO CAPS: 100.0000 mg | ORAL_CAPSULE | Freq: Three times a day (TID) | ORAL | 0 refills | Status: DC | PRN

## 2019-08-07 MED ORDER — HYDROCODONE-HOMATROPINE 5-1.5 MG/5ML PO SYRP: 5.0000 mL | ORAL_SOLUTION | Freq: Two times a day (BID) | ORAL | 0 refills | Status: DC | PRN

## 2019-08-07 NOTE — Patient Instructions (Addendum)
For x-ray imaging:  8:00-4:30 pm, Monday -Friday  [facility]  [address], [place], [place] [address] Enter through Main Entrance and request x-ray department. You will be contacted either via phone or via [place] with your x-ray result.    I will notify you of your lab results via [place].   If your chest x-ray is abnormal I will add an additional antibiotic however for tonight and start you on azithromycin take as directed.  For your cough I am starting you on Hycodan cough syrup (may cause drowsiness) and  Tessalon Perles for cough.  Start duo nebs with the budesonide and albuterol every 6 hours as needed for shortness of breath and persistent cough and/or wheezing.       COVID-19 Frequently Asked Questions COVID-19 (coronavirus disease) is an infection that is caused by a large family of viruses. Some viruses cause illness in people and others cause illness in animals like camels, cats, and bats. In some cases, the viruses that cause illness in animals can spread to humans. Where did the coronavirus come from? In December 2019, [place] told the [facility]) of several cases of lung disease (human respiratory illness). These cases were linked to an open seafood and livestock market in the city of [place]. The link to the seafood and livestock market suggests that the virus may have spread from animals to humans. However, since that first outbreak in December, the virus has also been shown to spread from person to person. What is the name of the disease and the virus? Disease name Early on, this disease was called novel coronavirus. This is because scientists determined that the disease was caused by a new (novel) respiratory virus. The World Health Organization [facility]) has now named the disease COVID-19, or coronavirus disease. Virus name The virus that causes the disease is called severe acute respiratory syndrome coronavirus 2 (SARS-CoV-2). More  information on disease and virus naming World Health Organization [facility]): www.who.int/emergencies/diseases/novel-coronavirus-2019/technical-guidance/naming-the-coronavirus-disease-(covid-2019)-and-the-virus-that-causes-it Who is at risk for complications from coronavirus disease? Some people may be at higher risk for complications from coronavirus disease. This includes older adults and people who have chronic diseases, such as heart disease, diabetes, and lung disease. If you are at higher risk for complications, take these extra precautions:  Stay home as much as possible.  Avoid social gatherings and travel.  Avoid close contact with others. Stay at least 6 ft (2 m) away from others, if possible.  Wash your hands often with soap and water for at least 20 seconds.  Avoid touching your face, mouth, nose, or eyes.  Keep supplies on hand at home, such as food, medicine, and cleaning supplies.  If you must go out in public, wear a cloth face covering or face mask. Make sure your mask covers your nose and mouth. How does coronavirus disease spread? The virus that causes coronavirus disease spreads easily from person to person (is contagious). You may catch the virus by:  Breathing in droplets from an infected person. Droplets can be spread by a person breathing, speaking, singing, coughing, or sneezing.  Touching something, like a table or a doorknob, that was exposed to the virus (contaminated) and then touching your mouth, nose, or eyes. Can I get the virus from touching surfaces or objects? There is still a lot that we do not know about the virus that causes coronavirus disease. Scientists are basing a lot of information on what they know about similar viruses, such as:  Viruses cannot  generally survive on surfaces for long. They need a human body (host) to survive.  It is more likely that the virus is spread by close contact with people who are sick (direct contact), such as  through: ? Shaking hands or hugging. ? Breathing in respiratory droplets that travel through the air. Droplets can be spread by a person breathing, speaking, singing, coughing, or sneezing.  It is less likely that the virus is spread when a person touches a surface or object that has the virus on it (indirect contact). The virus may be able to enter the body if the person touches a surface or object and then touches his or her face, eyes, nose, or mouth. Can a person spread the virus without having symptoms of the disease? It may be possible for the virus to spread before a person has symptoms of the disease, but this is most likely not the main way the virus is spreading. It is more likely for the virus to spread by being in close contact with people who are sick and breathing in the respiratory droplets spread by a person breathing, speaking, singing, coughing, or sneezing. What are the symptoms of coronavirus disease? Symptoms vary from person to person and can range from mild to severe. Symptoms may include:  Fever or chills.  Cough.  Difficulty breathing or feeling short of breath.  Headaches, body aches, or muscle aches.  Runny or stuffy (congested) nose.  Sore throat.  New loss of taste or smell.  Nausea, vomiting, or diarrhea. These symptoms can appear anywhere from 2 to 14 days after you have been exposed to the virus. Some people may not have any symptoms. If you develop symptoms, call your health care provider. People with severe symptoms may need hospital care. Should I be tested for this virus? Your health care provider will decide whether to test you based on your symptoms, history of exposure, and your risk factors. How does a health care provider test for this virus? Health care providers will collect samples to send for testing. Samples may include:  Taking a swab of fluid from the back of your nose and throat, your nose, or your throat.  Taking fluid from the lungs  by having you cough up mucus (sputum) into a sterile cup.  Taking a blood sample. Is there a treatment or vaccine for this virus? Currently, there is no vaccine to prevent coronavirus disease. Also, there are no medicines like antibiotics or antivirals to treat the virus. A person who becomes sick is given supportive care, which means rest and fluids. A person may also relieve his or her symptoms by using over-the-counter medicines that treat sneezing, coughing, and runny nose. These are the same medicines that a person takes for the common cold. If you develop symptoms, call your health care provider. People with severe symptoms may need hospital care. What can I do to protect myself and my family from this virus?     You can protect yourself and your family by taking the same actions that you would take to prevent the spread of other viruses. Take the following actions:  Wash your hands often with soap and water for at least 20 seconds. If soap and water are not available, use alcohol-based hand sanitizer.  Avoid touching your face, mouth, nose, or eyes.  Cough or sneeze into a tissue, sleeve, or elbow. Do not cough or sneeze into your hand or the air. ? If you cough or sneeze into a tissue,  throw it away immediately and wash your hands.  Disinfect objects and surfaces that you frequently touch every day.  Stay away from people who are sick.  Avoid going out in public, follow guidance from your state and local health authorities.  Avoid crowded indoor spaces. Stay at least 6 ft (2 m) away from others.  If you must go out in public, wear a cloth face covering or face mask. Make sure your mask covers your nose and mouth.  Stay home if you are sick, except to get medical care. Call your health care provider before you get medical care. Your health care provider will tell you how long to stay home.  Make sure your vaccines are up to date. Ask your health care provider what vaccines you  need. What should I do if I need to travel? Follow travel recommendations from your local health authority, the CDC, and WHO. Travel information and advice  Centers for Disease Control and Prevention (CDC): BodyEditor.hu  World Health Organization [facility]): ThirdIncome.ca Know the risks and take action to protect your health  You are at higher risk of getting coronavirus disease if you are traveling to areas with an outbreak or if you are exposed to travelers from areas with an outbreak.  Wash your hands often and practice good hygiene to lower the risk of catching or spreading the virus. What should I do if I am sick? General instructions to stop the spread of infection  Wash your hands often with soap and water for at least 20 seconds. If soap and water are not available, use alcohol-based hand sanitizer.  Cough or sneeze into a tissue, sleeve, or elbow. Do not cough or sneeze into your hand or the air.  If you cough or sneeze into a tissue, throw it away immediately and wash your hands.  Stay home unless you must get medical care. Call your health care provider or local health authority before you get medical care.  Avoid public areas. Do not take public transportation, if possible.  If you can, wear a mask if you must go out of the house or if you are in close contact with someone who is not sick. Make sure your mask covers your nose and mouth. Keep your home clean  Disinfect objects and surfaces that are frequently touched every day. This may include: ? Counters and tables. ? Doorknobs and light switches. ? Sinks and faucets. ? Electronics such as phones, remote controls, keyboards, computers, and tablets.  Wash dishes in hot, soapy water or use a dishwasher. Air-dry your dishes.  Wash laundry in hot water. Prevent infecting other household members  Let healthy household members  care for children and pets, if possible. If you have to care for children or pets, wash your hands often and wear a mask.  Sleep in a different bedroom or bed, if possible.  Do not share personal items, such as razors, toothbrushes, deodorant, combs, brushes, towels, and washcloths. Where to find more information Centers for Disease Control and Prevention (CDC)  Information and news updates: https://www.butler-gonzalez.com/ World Health Organization [facility])  Information and news updates: MissExecutive.com.ee  Coronavirus health topic: https://www.castaneda.info/  Questions and answers on COVID-19: OpportunityDebt.at  Global tracker: who.sprinklr.com American Academy of Pediatrics (AAP)  Information for families: www.healthychildren.org/English/health-issues/conditions/chest-lungs/Pages/2019-Novel-Coronavirus.aspx The coronavirus situation is changing rapidly. Check your local health authority website or the CDC and [facility] websites for updates and news. When should I contact a health care provider?  Contact your health care provider if you  have symptoms of an infection, such as fever or cough, and you: ? Have been near anyone who is known to have coronavirus disease. ? Have come into contact with a person who is suspected to have coronavirus disease. ? Have traveled to an area where there is an outbreak of COVID-19. When should I get emergency medical care?  Get help right away by calling your local emergency services (911 in the [place] if you have: ? Trouble breathing. ? Pain or pressure in your chest. ? Confusion. ? Blue-tinged lips and fingernails. ? Difficulty waking from sleep. ? Symptoms that get worse. Let the emergency medical personnel know if you think you have coronavirus disease. Summary  A new respiratory virus is spreading from person to person and causing COVID-19 (coronavirus  disease).  The virus that causes COVID-19 appears to spread easily. It spreads from one person to another through droplets from breathing, speaking, singing, coughing, or sneezing.  Older adults and those with chronic diseases are at higher risk of disease. If you are at higher risk for complications, take extra precautions.  There is currently no vaccine to prevent coronavirus disease. There are no medicines, such as antibiotics or antivirals, to treat the virus.  You can protect yourself and your family by washing your hands often, avoiding touching your face, and covering your coughs and sneezes. This information is not intended to replace advice given to you by your health care provider. Make sure you discuss any questions you have with your health care provider. Document Revised: 05/03/2019 Document Reviewed: 10/30/2018 Elsevier Patient Education  [place] of Breath, Adult Shortness of breath means you have trouble breathing. Shortness of breath could be a sign of a medical problem. Follow these instructions at home:   Watch for any changes in your symptoms.  Do not use any products that contain nicotine or tobacco, such as cigarettes, e-cigarettes, and chewing tobacco.  Do not smoke. Smoking can cause shortness of breath. If you need help to quit smoking, ask your doctor.  Avoid things that can make it harder to breathe, such as: ? Mold. ? Dust. ? Air pollution. ? Chemical smells. ? Things that can cause allergy symptoms (allergens), if you have allergies.  Keep your living space clean. Use products that help remove mold and dust.  Rest as needed. Slowly return to your normal activities.  Take over-the-counter and prescription medicines only as told by your doctor. This includes oxygen therapy and inhaled medicines.  Keep all follow-up visits as told by your doctor. This is important. Contact a doctor if:  Your condition does not get better as  soon as expected.  You have a hard time doing your normal activities, even after you rest.  You have new symptoms. Get help right away if:  Your shortness of breath gets worse.  You have trouble breathing when you are resting.  You feel light-headed or you pass out (faint).  You have a cough that is not helped by medicines.  You cough up blood.  You have pain with breathing.  You have pain in your chest, arms, shoulders, or belly (abdomen).  You have a fever.  You cannot walk up stairs.  You cannot exercise the way you normally do. These symptoms may represent a serious problem that is an emergency. Do not wait to see if the symptoms will go away. Get medical help right away. Call your local emergency services (911 in the [place] Do not drive yourself  to the hospital. Summary  Shortness of breath is when you have trouble breathing enough air. It can be a sign of a medical problem.  Avoid things that make it hard for you to breathe, such as smoking, pollution, mold, and dust.  Watch for any changes in your symptoms. Contact your doctor if you do not get better or you get worse. This information is not intended to replace advice given to you by your health care provider. Make sure you discuss any questions you have with your health care provider. Document Revised: 12/04/2017 Document Reviewed: 12/04/2017 Elsevier Patient Education  [place].

## 2019-08-07 NOTE — Progress Notes (Addendum)
Patient ID: Victoria Bates, female    DOB: 09/02/1980, 39 y.o.   MRN: [id number]  PCP: Lavone Orn, MD  Chief Complaint  Patient presents with  . covid pos    Subjective:  HPI Victoria Bates is a 39 y.o. female presents to [facility] respiratory clinic for evaluation of worsening respiratory symptoms related to COVID-19. Patient was diagnosed with COVID-19 on 08/06/19.  She is experiencing a persistent cough, shortness of breath, and chest tightness. Patient has a history of asthma and has administered nebulizer treatments (albuterol) and used albuterol inhaler without significant improvement of symptoms.  She experienced fever earlier today, TMAX 102. 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  She is not currently being treated with chemotherapy or radiation. She suffers from  Pancytopenia with her last CBC 01/31/19. Due to COVID-19, recent oncology visit was complete via telemedicine encounter earlier this month.  High Risk Condition Associated with COVID-19: Asthma    Social History   Socioeconomic History  . Marital status: Married    Spouse name: Not on file  . Number of children: 2  . Years of education: College  . Highest education level: Not on file  Occupational History  . Occupation: Engineer, mining: [employer]  Tobacco Use  . Smoking status: Never Smoker  . Smokeless tobacco: Never Used  Substance and Sexual Activity  . Alcohol use: No    Alcohol/week: 0.0 standard drinks  . Drug use: No  . Sexual activity: Yes    Partners: Male    Birth control/protection: Surgical  Other Topics Concern  . Not on file  Social History Narrative   Patient lives at home with her two children.   Caffeine use: minimum of 4 cups daily   Social Determinants of Health   Financial Resource Strain:   . Difficulty of Paying Living Expenses: Not on file  Food  Insecurity:   . Worried About Charity fundraiser in the Last Year: Not on file  . Ran Out of Food in the Last Year: Not on file  Transportation Needs:   . Lack of Transportation (Medical): Not on file  . Lack of Transportation (Non-Medical): Not on file  Physical Activity:   . Days of Exercise per Week: Not on file  . Minutes of Exercise per Session: Not on file  Stress:   . Feeling of Stress : Not on file  Social Connections:   . Frequency of Communication with Friends and Family: Not on file  . Frequency of Social Gatherings with Friends and Family: Not on file  . Attends Religious Services: Not on file  . Active Member of Clubs or Organizations: Not on file  . Attends Archivist Meetings: Not on file  . Marital Status: Not on file  Intimate Partner Violence:   . Fear of Current or Ex-Partner: Not on file  . Emotionally Abused: Not on file  . Physically Abused: Not on file  . Sexually Abused: Not on file    Family History  Problem Relation Age of Onset  . Hypertension Mother   . Skin cancer Mother   . Thyroid cancer Father   . Skin cancer Father   . Breast cancer Maternal Aunt        dx in her 85s  . Prostate cancer Maternal Uncle 63  . Melanoma Maternal Uncle   .  Ovarian cancer Maternal Grandmother        dx in her late 63s-40s  . Breast cancer Maternal Aunt        bilateral breast cancer dx in 46s and 51s; possibly BRCA+  . Other Son        multiple CAL spots, bright spots on brain MRI, ? chiari malformation     Review of Systems  Pertinent negatives listed in HPI  Patient Active Problem List   Diagnosis Date Noted  . Dysuria 08/03/2018  . Elevated BP without diagnosis of hypertension 08/03/2018  . Chronic leukopenia 08/03/2018  . Acute pain 04/18/2018  . Polypharmacy 11/14/2017  . Easy bruising 11/14/2017  . Sinus infection 11/14/2017  . Pulmonary nodules 08/18/2017  . Vitamin D deficiency 07/19/2017  . Vitamin B12 deficiency 07/19/2017  .  Cough, persistent 06/20/2017  . Orthostatic dizziness 01/24/2017  . Addison disease ([place]) 01/16/2017  . Anxiety 01/16/2017  . 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 Goals of care, counseling/discussion 11/20/2016  . Pancytopenia, acquired ([place]) 10/10/2016  . Deficiency anemia 10/10/2016  . Upper airway cough syndrome 01/28/2016  . Cough variant asthma 01/28/2016  . Dyspnea 01/28/2016  . 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 Constipation, acute 10/12/2015  . Cognitive impairment 08/13/2015  . Dysphagia, idiopathic 04/30/2015  . Low serum cortisol level ([place]) 10/20/2014  . 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 Chronic fatigue 10/03/2014  . Hot flashes 10/03/2014  . Liver lesion 08/22/2014  . Pancytopenia due to antineoplastic chemotherapy ([place]) 06/23/2014  . Genetic testing 06/13/2014  . Intractable chronic cluster headache 04/28/2014  . Thrombocytopenia ([place]) 04/28/2014  . Neuropathic pain 04/08/2014  . Simple partial seizures ([place]) 03/20/2014  . Sensory seizure ([place]) 03/20/2014  . Dysphasia 02/12/2014  . Headache 02/12/2014  . Seizures ([place]) 02/12/2014  . Financial difficulty 02/12/2014  . CIN I (cervical intraepithelial neoplasia I) 02/12/2014  . Anemia, unspecified 02/12/2014  . 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 Paresthesias 09/20/2013  . Blurred vision, left eye 09/20/2013  . Asthma 07/24/2009  . CHEST PAIN 07/24/2009    Allergies  Allergen Reactions  . Adhesive [Tape] Other (See Comments)    Skin tear  . Latex Hives  . Medical Adhesive Remover     Per patient states reaction to dermabond when had hysterectomy. Stated was red and irritated.   Marland Kitchen Percocet  [Oxycodone-Acetaminophen] Other (See Comments)    makes pt angry  . Prednisone Nausea And Vomiting  . Shellfish Allergy Diarrhea and Nausea And Vomiting  . Soma [Carisoprodol]     Watson brand, specifically, causes disorientation (can take other brands)  . Tramadol Hcl Other (See Comments)    Potentially worsen seizure control  . Vancomycin Hives  . Zofran [Ondansetron Hcl]     Pt. Stated side effects : abdominal pain / constipation     Prior to Admission medications   Medication Sig Start Date End Date Taking? Authorizing Provider  albuterol (PROVENTIL) (2.5 MG/3ML) 0.083% nebulizer solution 1 vial every 4-6 hours as needed for wheezing, shortness of breath 08/06/19   Martyn Ehrich, NP  butalbital-acetaminophen-caffeine (FIORICET) 50-325-40 MG tablet TAKE 1 TABLET BY MOUTH EVERY 6 HOURS AS NEEDED FOR HEADACHE 07/26/19   Heath Lark, MD  carisoprodol (SOMA) 350 MG tablet  TAKE 1 TABLET BY MOUTH 4 TIMES DAILY FOR SEIZURE PREVENTION 06/11/19   Heath Lark, MD  Cyanocobalamin (VITAMIN B-12 PO) Take 5,000 Units daily by mouth.     [provider]  divalproex (DEPAKOTE ER) 250 MG 24 hr tablet Take 2 tabs in AM, 3 tabs in PM 02/22/19   Cameron Sprang, MD  eletriptan (RELPAX) 20 MG tablet Take 1 tablet as needed for migraine. Do not take more than 2-3 a week 02/22/19   Cameron Sprang, MD  EPINEPHRINE 0.3 mg/0.3 mL IJ SOAJ injection INJECT 0.3 MLS (ONE SYRINGE) INTO THE MUSCLE ONCE FOR 1 DOSE AS NEEDED. Patient taking differently: Inject 0.3 mg into the muscle once.  03/23/18   Kozlow, Donnamarie Poag, MD  gabapentin (NEURONTIN) 300 MG capsule Take 4 capsules twice a day 02/22/19   Cameron Sprang, MD  HYDROcodone-acetaminophen [facility]) 10-325 MG tablet Take 1 tablet by mouth every 5 (five) hours as needed. 08/02/19   Heath Lark, MD  levocetirizine (XYZAL) 5 MG tablet Take 1 tablet (5 mg total) by mouth every evening. 10/08/18   Heath Lark, MD  LORazepam (ATIVAN) 1 MG tablet Take 1 tablet (1 mg total) by  mouth 2 (two) times daily as needed for anxiety. 07/26/19   Heath Lark, MD  montelukast (SINGULAIR) 10 MG tablet Take 1 tablet (10 mg total) by mouth at bedtime. 10/08/18   Heath Lark, MD  naproxen (NAPROSYN) 250 MG tablet Take by mouth 3 (three) times daily with meals.    [provider]  Rimegepant Sulfate (NURTEC) 75 MG TBDP Take 1 tablet by mouth as needed (Take 1 tablet as needed for migraine. Do not take more than 2-3 times a week). 06/26/19   Cameron Sprang, MD    Past Medical, Surgical Family and Social History reviewed and updated.    Objective:   Today's Vitals   08/07/19 1839  BP: 118/78  Pulse: 93  Resp: 12  Temp: 97.7 F (36.5 C)  SpO2: 97%  Weight: 151 lb 9.6 oz (68.8 kg)  Height: '5\' 9"'  (1.753 m)    Wt Readings from Last 3 Encounters:  08/07/19 151 lb 9.6 oz (68.8 kg)  06/03/19 149 lb 12.8 oz (67.9 kg)  03/26/18 140 lb (63.5 kg)   Physical Exam Constitutional:      Appearance: She is ill-appearing.  HENT:     Head: Normocephalic.     Nose: Congestion present.  Cardiovascular:     Rate and Rhythm: Normal rate and regular rhythm.     Heart sounds: Normal heart sounds.  Pulmonary:     Effort: Pulmonary effort is normal.     Breath sounds: Decreased air movement and transmitted upper airway sounds present. Examination of the right-upper field reveals wheezing and rhonchi. Examination of the left-upper field reveals wheezing and rhonchi. Wheezing and rhonchi present.     Comments: Persistent cyclic cough (present during exam) Chest:     Chest wall: Tenderness present.  Skin:    General: Skin is warm and dry.  Psychiatric:        Mood and Affect: Mood and affect normal.        Assessment & Plan:  1. Pancytopenia ([place]), chronic Check CBC with Differential given current COVID-19 infection and concern for possible infection.  2. COVID-19, dx 5/57/32 Complicated by asthma.  Adding Budesonide to nebulizer treatments. Patient intolerable to oral  prednisone due to GI side effects. She can tolerate IM solumedrol-ordered Solumedrol 80 mg IM My Chart  Companion with temperature monitoring, ordered today. -Basic metabolic panel, to evaluate electrolyte function/renal function  -Azithromycin prescribed to cover bronchitis and or PNA -Chest x-ray ordered given abnormal lung exam findings today- need to rule out PNA  3. Shortness of breath -Continue nebulizer treatments with Budesonide and Albuterol as prescribed. - methylPREDNISolone sodium succinate (SOLU-MEDROL) 125 mg/2 mL injection 80 mg - DG Chest 2 View; Future  4. Persistent cough, -Continue nebulizer treatments with Budesonide and Albuterol as prescribed. -Benzonatate and Hycodan syrup prescribed to management cough. - DG Chest 2 View; Future       Meds ordered this encounter  Medications  . DISCONTD: methylPREDNISolone sodium succinate (SOLU-MEDROL) 125 mg/2 mL injection 80 mg  . methylPREDNISolone sodium succinate (SOLU-MEDROL) 125 mg/2 mL injection 80 mg  . DISCONTD: HYDROcodone-homatropine (HYCODAN) 5-1.5 MG/5ML syrup    Sig: Take 5 mLs by mouth every 12 (twelve) hours as needed for cough.    Dispense:  100 mL    Refill:  0  . DISCONTD: azithromycin (ZITHROMAX) 250 MG tablet    Sig: Take 2 tabs PO x 1 dose, then 1 tab PO QD x 4 days    Dispense:  6 tablet    Refill:  0  . DISCONTD: benzonatate (TESSALON) 100 MG capsule    Sig: Take 1-2 capsules (100-200 mg total) by mouth 3 (three) times daily as needed for cough.    Dispense:  40 capsule    Refill:  0  . DISCONTD: budesonide (PULMICORT) 0.25 MG/2ML nebulizer solution    Sig: Take 2 mLs (0.25 mg total) by nebulization every 6 (six) hours as needed (Not to exceed twice daily).    Dispense:  60 mL    Refill:  12  . budesonide (PULMICORT) 0.25 MG/2ML nebulizer solution    Sig: Take 2 mLs (0.25 mg total) by nebulization every 6 (six) hours as needed (Not to exceed twice daily).    Dispense:  60 mL    Refill:  12   . benzonatate (TESSALON) 100 MG capsule    Sig: Take 1-2 capsules (100-200 mg total) by mouth 3 (three) times daily as needed for cough.    Dispense:  40 capsule    Refill:  0  . HYDROcodone-homatropine (HYCODAN) 5-1.5 MG/5ML syrup    Sig: Take 5 mLs by mouth every 12 (twelve) hours as needed for cough.    Dispense:  100 mL    Refill:  0  . azithromycin (ZITHROMAX) 250 MG tablet    Sig: Take 2 tabs PO x 1 dose, then 1 tab PO QD x 4 days    Dispense:  6 tablet    Refill:  0    -The patient was given clear instructions to go to ER or return to medical center if symptoms do not improve, worsen or new problems develop. The patient verbalized understanding.   Molli Barrows, FNP-C [facility], PRN Provider  [facility] [place], [place] [facility] Phone: [phone] Clinic Fax: [phone] Clinic Hours: 5:30 pm -7:30 pm (Monday, Wednesday, and Friday)

## 2019-08-08 LAB — CBC WITH DIFFERENTIAL/PLATELET
Basophils Absolute: 0 10*3/uL (ref 0.0–0.2)
Basos: 1 %
EOS (ABSOLUTE): 0 10*3/uL (ref 0.0–0.4)
Eos: 0 %
Hematocrit: 34 % (ref 34.0–46.6)
Hemoglobin: 11.9 g/dL (ref 11.1–15.9)
Immature Grans (Abs): 0 10*3/uL (ref 0.0–0.1)
Immature Granulocytes: 1 %
Lymphocytes Absolute: 0.7 10*3/uL (ref 0.7–3.1)
Lymphs: 20 %
MCH: 33.3 pg — ABNORMAL HIGH (ref 26.6–33.0)
MCHC: 35 g/dL (ref 31.5–35.7)
MCV: 95 fL (ref 79–97)
Monocytes Absolute: 0.4 10*3/uL (ref 0.1–0.9)
Monocytes: 13 %
Neutrophils Absolute: 2.2 10*3/uL (ref 1.4–7.0)
Neutrophils: 65 %
Platelets: 144 10*3/uL — ABNORMAL LOW (ref 150–450)
RBC: 3.57 x10E6/uL — ABNORMAL LOW (ref 3.77–5.28)
RDW: 11.2 % — ABNORMAL LOW (ref 11.7–15.4)
WBC: 3.3 10*3/uL — ABNORMAL LOW (ref 3.4–10.8)

## 2019-08-08 LAB — BASIC METABOLIC PANEL
BUN/Creatinine Ratio: 19 (ref 9–23)
BUN: 12 mg/dL (ref 6–20)
CO2: 28 mmol/L (ref 20–29)
Calcium: 9 mg/dL (ref 8.7–10.2)
Chloride: 102 mmol/L (ref 96–106)
Creatinine, Ser: 0.62 mg/dL (ref 0.57–1.00)
GFR calc Af Amer: 133 mL/min/{1.73_m2} (ref >59)
GFR calc non Af Amer: 116 mL/min/{1.73_m2} (ref >59)
Glucose: 102 mg/dL — ABNORMAL HIGH (ref 65–99)
Potassium: 4.8 mmol/L (ref 3.5–5.2)
Sodium: 140 mmol/L (ref 134–144)

## 2019-08-08 MED ORDER — AZITHROMYCIN 250 MG PO TABS: ORAL_TABLET | ORAL | 0 refills | Status: DC

## 2019-08-09 ENCOUNTER — Other Ambulatory Visit: Payer: Self-pay

## 2019-08-09 ENCOUNTER — Ambulatory Visit (HOSPITAL_COMMUNITY)
Admission: RE | Admit: 2019-08-09 | Discharge: 2019-08-09 | Disposition: A | Discharge status: Home / Self Care | Payer: Medicare Other | Source: Ambulatory Visit | Attending: Family Medicine | Admitting: Family Medicine

## 2019-08-09 DIAGNOSIS — R0602 Shortness of breath: Secondary | ICD-10-CM

## 2019-08-09 DIAGNOSIS — R05 Cough: Secondary | ICD-10-CM | POA: Insufficient documentation

## 2019-08-09 DIAGNOSIS — R053 Chronic cough: Secondary | ICD-10-CM

## 2019-08-13 MED FILL — BUDESONIDE 0.25 MG/2ML SUSP: 0.25 | 30 days supply | Qty: 60 | Fill #0

## 2019-08-14 ENCOUNTER — Encounter: Payer: Self-pay | Admitting: Hematology and Oncology

## 2019-08-14 ENCOUNTER — Ambulatory Visit (INDEPENDENT_AMBULATORY_CARE_PROVIDER_SITE_OTHER): Payer: Medicare Other | Admitting: Family Medicine

## 2019-08-14 ENCOUNTER — Other Ambulatory Visit: Payer: Self-pay | Admitting: Hematology and Oncology

## 2019-08-14 VITALS — BP 120/86 | HR 110 | Temp 98.4°F | Ht 69.0 in | Wt 150.4 lb

## 2019-08-14 DIAGNOSIS — J4541 Moderate persistent asthma with (acute) exacerbation: Secondary | ICD-10-CM | POA: Diagnosis not present

## 2019-08-14 DIAGNOSIS — U071 COVID-19: Secondary | ICD-10-CM | POA: Diagnosis not present

## 2019-08-14 DIAGNOSIS — J40 Bronchitis, not specified as acute or chronic: Secondary | ICD-10-CM | POA: Diagnosis not present

## 2019-08-14 DIAGNOSIS — D61818 Other pancytopenia: Secondary | ICD-10-CM | POA: Diagnosis not present

## 2019-08-14 MED ORDER — BUTALBITAL-APAP-CAFFEINE 50-325-40 MG PO TABS: ORAL_TABLET | ORAL | 0 refills | Status: DC

## 2019-08-14 MED ORDER — METHYLPREDNISOLONE SODIUM SUCC 125 MG IJ SOLR
80.0000 mg | Freq: Once | INTRAMUSCULAR | Status: AC
  Administered 2019-08-14: 18:00:00 80 mg via INTRAMUSCULAR

## 2019-08-14 MED ORDER — AMOXICILLIN-POT CLAVULANATE 875-125 MG PO TABS: 1.0000 | ORAL_TABLET | Freq: Two times a day (BID) | ORAL | 0 refills | Status: DC

## 2019-08-14 MED FILL — BUTALB-ACETAMIN-CAFF 50-325: 50-325-40 | 22 days supply | Qty: 90 | Fill #0

## 2019-08-14 NOTE — Patient Instructions (Addendum)
Recent chest x-ray was normal. Platelet count slightly decreased, recheck CBC today and will forward to your oncologist. Start Augmentin today and take for 10 days. Follow-up with PCP for referral to Pulmonology if symptoms of cough do not improve.       Cough, Adult A cough helps to clear your throat and lungs. A cough may be a sign of an illness or another medical condition. An acute cough may only last 2-3 weeks, while a chronic cough may last 8 or more weeks. Many things can cause a cough. They include:  Germs (viruses or bacteria) that attack the airway.  Breathing in things that bother (irritate) your lungs.  Allergies.  Asthma.  Mucus that runs down the back of your throat (postnasal drip).  Smoking.  Acid backing up from the stomach into the tube that moves food from the mouth to the stomach (gastroesophageal reflux).  Some medicines.  Lung problems.  Other medical conditions, such as heart failure or a blood clot in the lung (pulmonary embolism). Follow these instructions at home: Medicines  Take over-the-counter and prescription medicines only as told by your doctor.  Talk with your doctor before you take medicines that stop a cough (coughsuppressants). Lifestyle   Do not smoke, and try not to be around smoke. Do not use any products that contain nicotine or tobacco, such as cigarettes, e-cigarettes, and chewing tobacco. If you need help quitting, ask your doctor.  Drink enough fluid to keep your pee (urine) pale yellow.  Avoid caffeine.  Do not drink alcohol if your doctor tells you not to drink. General instructions   Watch for any changes in your cough. Tell your doctor about them.  Always cover your mouth when you cough.  Stay away from things that make you cough, such as perfume, candles, campfire smoke, or cleaning products.  If the air is dry, use a cool mist vaporizer or humidifier in your home.  If your cough is worse at night, try using  extra pillows to raise your head up higher while you sleep.  Rest as needed.  Keep all follow-up visits as told by your doctor. This is important. Contact a doctor if:  You have new symptoms.  You cough up pus.  Your cough does not get better after 2-3 weeks, or your cough gets worse.  Cough medicine does not help your cough and you are not sleeping well.  You have pain that gets worse or pain that is not helped with medicine.  You have a fever.  You are losing weight and you do not know why.  You have night sweats. Get help right away if:  You cough up blood.  You have trouble breathing.  Your heartbeat is very fast. These symptoms may be an emergency. Do not wait to see if the symptoms will go away. Get medical help right away. Call your local emergency services (911 in the [place] Do not drive yourself to the hospital. Summary  A cough helps to clear your throat and lungs. Many things can cause a cough.  Take over-the-counter and prescription medicines only as told by your doctor.  Always cover your mouth when you cough.  Contact a doctor if you have new symptoms or you have a cough that does not get better or gets worse. This information is not intended to replace advice given to you by your health care provider. Make sure you discuss any questions you have with your health care provider. Document Revised: 07/23/2018 Document  Reviewed: 07/23/2018 Elsevier Patient Education  [place].     Bronchospasm, Adult  Bronchospasm is when airways in the lungs get smaller. When this happens, it can be hard to breathe. You may cough. You may also make a whistling sound when you breathe (wheeze). Follow these instructions at home: Medicines  Take over-the-counter and prescription medicines only as told by your doctor.  If you need to use an inhaler or nebulizer to take your medicine, ask your doctor how to use it.  If you were given a spacer, always use it with  your inhaler. Lifestyle  Change your heating and air conditioning filter. Do this at least once a month.  Try not to use fireplaces and wood stoves.  Do not  smoke. Do not  allow smoking in your home.  Try not to use things that have a strong smell, like perfume.  Get rid of pests (such as roaches and mice) and their poop.  Remove any mold from your home.  Keep your house clean. Get rid of dust.  Use cleaning products that have no smell.  Replace carpet with wood, tile, or vinyl flooring.  Use allergy-proof pillows, mattress covers, and box spring covers.  Wash bed sheets and blankets every week. Use hot water. Dry them in a dryer.  Use blankets that are made of polyester or cotton.  Wash your hands often.  Keep pets out of your bedroom.  When you exercise, try not to breathe in cold air. General instructions  Have a plan for getting medical care. Know these things: ? When to call your doctor. ? When to call local emergency services (911 in the [place] ? Where to go in an emergency.  Stay up to date on your shots (immunizations).  When you have an episode: ? Stay calm. ? Relax. ? Breathe slowly. Contact a doctor if:  Your muscles ache.  Your chest hurts.  The color of the mucus you cough up (sputum) changes from clear or white to yellow, green, gray, or bloody.  The mucus you cough up gets thicker.  You have a fever. Get help right away if:  The whistling sound gets worse, even after you take your medicines.  Your coughing gets worse.  You find it even harder to breathe.  Your chest hurts very much. Summary  Bronchospasm is when airways in the lungs get smaller.  When this happens, it can be hard to breathe. You may cough. You may also make a whistling sound when you breathe.  Stay away from things that cause you to have episodes. These include smoke or dust. This information is not intended to replace advice given to you by your health care  provider. Make sure you discuss any questions you have with your health care provider. Document Revised: 06/16/2017 Document Reviewed: 07/07/2016 Elsevier Patient Education  2020 [employer].

## 2019-08-14 NOTE — Progress Notes (Signed)
Patient ID: Victoria Bates, female    DOB: April 18, 1981, 39 y.o.   MRN: [id number]  PCP: Lavone Orn, MD  Chief Complaint  Patient presents with  . Cough    Subjective:  HPI  Victoria Bates is a 39 y.o. female presents to [facility] for follow-up of symptoms related to COVID-19 Infection.  Today's visit is follow-up from previous visit 08/07/19.  Prior visit 08/07/19 Victoria Bates is a 39 y.o. female presents to [facility] respiratory clinic for evaluation of worsening respiratory symptoms related to COVID-19. Patient was diagnosed with COVID-19 on 08/06/19. She is experiencing a persistent cough, shortness of breath, and chest tightness. Patient has a history of asthma and has administered nebulizer treatments (albuterol) and used albuterol inhaler without significant improvement of symptoms.  She experienced fever earlier today, TMAX 102. 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  She is not currently being treated with chemotherapy or radiation. She suffers from  Pancytopenia with her last CBC 01/31/19. Due to COVID-19, recent oncology visit was complete via telemedicine encounter earlier this month.   Follow-up visit 08/14/19 Continues to experience SOB with activity and at rest. Most worrisome symptom is cough with thick hard pellets of mucus. Upper chest wall tightness remains present with deep inhalation. She is using nebulizer treatments and albuterol inhaler in excess of the prescribed frequency. Reports used albuterol approximately 10 times today. She is intolerant of oral prednisone and tolerant of IM methylprednisolone which she reports improved work of breathing yesterday. Cough is persistent, although she has avoided taking much cough medication as she doesn't want to suppress her cough. She is afebrile today. Continues to endorse fatigue. Platelet count was low per CBC  results last week. Pt has been free of bleeding or severe bruising. Patient has pancytopenia and is followed by oncology.  High Risk Condition Associated with COVID-19: Asthma    Review of Systems Pertinent negatives listed in HPI  Patient Active Problem List   Diagnosis Date Noted  . Dysuria 08/03/2018  . Elevated BP without diagnosis of hypertension 08/03/2018  . Chronic leukopenia 08/03/2018  . Acute pain 04/18/2018  . Polypharmacy 11/14/2017  . Easy bruising 11/14/2017  . Sinus infection 11/14/2017  . Pulmonary nodules 08/18/2017  . Vitamin D deficiency 07/19/2017  . Vitamin B12 deficiency 07/19/2017  . Cough, persistent 06/20/2017  . Orthostatic dizziness 01/24/2017  . Addison disease ([place]) 01/16/2017  . Anxiety 01/16/2017  . 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 Goals of care, counseling/discussion 11/20/2016  . Pancytopenia, acquired ([place]) 10/10/2016  . Deficiency anemia 10/10/2016  . Upper airway cough syndrome 01/28/2016  . Cough variant asthma 01/28/2016  . Dyspnea 01/28/2016  . 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 Constipation, acute 10/12/2015  . Cognitive impairment 08/13/2015  . Dysphagia, idiopathic 04/30/2015  . Low serum cortisol level ([place]) 10/20/2014  . 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 Chronic fatigue 10/03/2014  . Hot flashes 10/03/2014  . Liver lesion 08/22/2014  . Pancytopenia due to antineoplastic chemotherapy ([place]) 06/23/2014  . Genetic testing 06/13/2014  . Intractable chronic cluster headache 04/28/2014  . Thrombocytopenia ([place]) 04/28/2014  . Neuropathic pain 04/08/2014  . Simple partial seizures ([place]) 03/20/2014  . Sensory seizure ([place]) 03/20/2014  . Dysphasia 02/12/2014  . Headache  02/12/2014  . Seizures ([place]) 02/12/2014  . Financial difficulty 02/12/2014   . CIN I (cervical intraepithelial neoplasia I) 02/12/2014  . Anemia, unspecified 02/12/2014  . 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 Paresthesias 09/20/2013  . Blurred vision, left eye 09/20/2013  . Asthma 07/24/2009  . CHEST PAIN 07/24/2009      Prior to Admission medications   Medication Sig Start Date End Date Taking? Authorizing Provider  albuterol (PROVENTIL) (2.5 MG/3ML) 0.083% nebulizer solution 1 vial every 4-6 hours as needed for wheezing, shortness of breath 08/06/19  Yes Martyn Ehrich, NP  azithromycin (ZITHROMAX) 250 MG tablet Take 2 tabs PO x 1 dose, then 1 tab PO QD x 4 days 08/08/19  Yes Scot Jun, FNP  benzonatate (TESSALON) 100 MG capsule Take 1-2 capsules (100-200 mg total) by mouth 3 (three) times daily as needed for cough. 08/07/19  Yes Scot Jun, FNP  budesonide (PULMICORT) 0.25 MG/2ML nebulizer solution Take 2 mLs (0.25 mg total) by nebulization every 6 (six) hours as needed (Not to exceed twice daily). 08/07/19  Yes Scot Jun, FNP  butalbital-acetaminophen-caffeine (FIORICET) 50-325-40 MG tablet TAKE 1 TABLET BY MOUTH EVERY 6 HOURS AS NEEDED FOR HEADACHE 08/14/19  Yes Gorsuch, Ni, MD  carisoprodol (SOMA) 350 MG tablet TAKE 1 TABLET BY MOUTH 4 TIMES DAILY FOR SEIZURE PREVENTION 06/11/19  Yes Heath Lark, MD  divalproex (DEPAKOTE ER) 250 MG 24 hr tablet Take 2 tabs in AM, 3 tabs in PM 02/22/19  Yes Cameron Sprang, MD  EPINEPHRINE 0.3 mg/0.3 mL IJ SOAJ injection INJECT 0.3 MLS (ONE SYRINGE) INTO THE MUSCLE ONCE FOR 1 DOSE AS NEEDED. Patient taking differently: Inject 0.3 mg into the muscle once.  03/23/18  Yes Kozlow, Donnamarie Poag, MD  gabapentin (NEURONTIN) 300 MG capsule Take 4 capsules twice a day 02/22/19  Yes Cameron Sprang, MD  HYDROcodone-acetaminophen [facility]) 10-325 MG tablet Take 1 tablet by mouth every 5 (five) hours  as needed. 08/02/19  Yes Gorsuch, Ni, MD  HYDROcodone-homatropine (HYCODAN) 5-1.5 MG/5ML syrup Take 5 mLs by mouth every 12 (twelve) hours as needed for cough. 08/07/19  Yes Scot Jun, FNP  levocetirizine (XYZAL) 5 MG tablet Take 1 tablet (5 mg total) by mouth every evening. 10/08/18  Yes Gorsuch, Ni, MD  LORazepam (ATIVAN) 1 MG tablet Take 1 tablet (1 mg total) by mouth 2 (two) times daily as needed for anxiety. 07/26/19  Yes Gorsuch, Ni, MD  montelukast (SINGULAIR) 10 MG tablet Take 1 tablet (10 mg total) by mouth at bedtime. 10/08/18  Yes Gorsuch, Ni, MD  naproxen (NAPROSYN) 250 MG tablet Take by mouth 3 (three) times daily with meals.   Yes [provider]  Rimegepant Sulfate (NURTEC) 75 MG TBDP Take 1 tablet by mouth as needed (Take 1 tablet as needed for migraine. Do not take more than 2-3 times a week). 06/26/19  Yes Cameron Sprang, MD  Cyanocobalamin (VITAMIN B-12 PO) Take 5,000 Units daily by mouth.     [provider]  eletriptan (RELPAX) 20 MG tablet Take 1 tablet as needed for migraine. Do not take more than 2-3 a week 02/22/19   Cameron Sprang, MD    Past Medical, Surgical Family and Social History reviewed and updated.    Objective:   Today's Vitals   08/14/19 1744  BP: 120/86  Pulse: (!) 110  Temp:  98.4 F (36.9 C)  TempSrc: Oral  SpO2: 99%  Weight: 150 lb 6.4 oz (68.2 kg)  Height: 5\' 9"  (1.753 m)   Wt Readings from Last 3 Encounters:  08/07/19 151 lb 9.6 oz (68.8 kg)  06/03/19 149 lb 12.8 oz (67.9 kg)  03/26/18 140 lb (63.5 kg)   Physical Exam Constitutional:      Appearance: She is ill-appearing.  HENT:     Head: Normocephalic.     Nose: Congestion present.  Cardiovascular:     Rate and Rhythm: Normal rate and regular rhythm.     Heart sounds: Normal heart sounds.  Pulmonary:     Effort: Pulmonary effort is normal.     Breath sounds: Decreased air movement and transmitted upper airway sounds present. Negative wheezing, rhonchi, or rales      Comments: Persistent cyclic cough (present during exam) Chest:     Chest wall: Tenderness present.  Skin:    General: Skin is warm and dry.  Psychiatric:        Mood and Affect: Mood and affect normal.     Assessment & Plan:  1. Pancytopenia ([place]), chronic CBC x 1 week ago, thrombocytopenia likely secondary to COVID-19. Will repeat today. Other blood counts were reassuring.  2. [id number], dx 1/19/21Complicated by asthma.  Continue Budesonide every 12 hours as needed / Albuterol treatments every 6 hours as needed.atient intolerable to oral prednisone due to GI side effects. She can tolerate IM solumedrol-ordered Solumedrol 80 mg IM My Chart Companion with temperature monitoring, ordered today. -Azithromycin prescribed to cover bronchitis last week, no real improvement of mucus production with cough, will trial Augmentin BID x 10 days   3. Shortness of breath -Continue nebulizer treatments with Budesonide and Albuterol as prescribed. - methylPREDNISolone sodium succinate (SOLU-MEDROL) 125 mg/2 mL injection 80 mg   4. Persistent cough, -Continue nebulizer treatments with Budesonide and Albuterol as prescribed. -Benzonatate and Hycodan syrup prescribed to management cough. Ok to continue Delsym OTC     Meds ordered this encounter  Medications  . methylPREDNISolone sodium succinate (SOLU-MEDROL) 125 mg/2 mL injection 80 mg  . amoxicillin-clavulanate (AUGMENTIN) 875-125 MG tablet    Sig: Take 1 tablet by mouth 2 (two) times daily.    Dispense:  20 tablet    Refill:  0    -The patient was given clear instructions to go to ER or return to medical center if symptoms do not improve, worsen or new problems develop. The patient verbalized understanding.   Follow-up with PCP and or pulmonologist. No additional follow-up warranted here at respiratory clinic unless symptoms worsen.   Molli Barrows, FNP-C [facility], PRN Provider  [facility]. [place], [place] [facility] Phone: [phone] Clinic Fax: [phone] Clinic Hours: 5:30 pm -7:30 pm (Monday- Friday)

## 2019-08-15 LAB — CBC WITH DIFFERENTIAL/PLATELET
Basophils Absolute: 0 10*3/uL (ref 0.0–0.2)
Basos: 1 %
EOS (ABSOLUTE): 0 10*3/uL (ref 0.0–0.4)
Eos: 0 %
Hematocrit: 35.2 % (ref 34.0–46.6)
Hemoglobin: 12.6 g/dL (ref 11.1–15.9)
Immature Grans (Abs): 0 10*3/uL (ref 0.0–0.1)
Immature Granulocytes: 0 %
Lymphocytes Absolute: 1.2 10*3/uL (ref 0.7–3.1)
Lymphs: 30 %
MCH: 33.5 pg — ABNORMAL HIGH (ref 26.6–33.0)
MCHC: 35.8 g/dL — ABNORMAL HIGH (ref 31.5–35.7)
MCV: 94 fL (ref 79–97)
Monocytes Absolute: 0.4 10*3/uL (ref 0.1–0.9)
Monocytes: 10 %
Neutrophils Absolute: 2.4 10*3/uL (ref 1.4–7.0)
Neutrophils: 59 %
Platelets: 239 10*3/uL (ref 150–450)
RBC: 3.76 x10E6/uL — ABNORMAL LOW (ref 3.77–5.28)
RDW: 11.6 % — ABNORMAL LOW (ref 11.7–15.4)
WBC: 4 10*3/uL (ref 3.4–10.8)

## 2019-08-15 MED FILL — AMOX-CLAV 875-125 MG TABLET: 875-125 | 10 days supply | Qty: 20 | Fill #0

## 2019-08-19 ENCOUNTER — Other Ambulatory Visit: Payer: Self-pay | Admitting: Hematology and Oncology

## 2019-08-19 ENCOUNTER — Telehealth: Payer: Self-pay | Admitting: *Deleted

## 2019-08-19 DIAGNOSIS — C719 Malignant neoplasm of brain, unspecified: Secondary | ICD-10-CM

## 2019-08-19 NOTE — Telephone Encounter (Signed)
Patient called to request a Hydrocodone refill to [facility]. Please call [name] when this is completed so he may pick it up.

## 2019-08-20 ENCOUNTER — Other Ambulatory Visit: Payer: Self-pay | Admitting: Hematology and Oncology

## 2019-08-20 DIAGNOSIS — C719 Malignant neoplasm of brain, unspecified: Secondary | ICD-10-CM

## 2019-08-20 MED ORDER — HYDROCODONE-ACETAMINOPHEN 10-325 MG PO TABS: 1.0000 | ORAL_TABLET | Freq: Four times a day (QID) | ORAL | 0 refills | Status: DC | PRN

## 2019-08-20 MED FILL — HYDROCODON-APAP 10-325: 10-325 | 23 days supply | Qty: 90 | Fill #0

## 2019-08-20 NOTE — Telephone Encounter (Signed)
I refilled it Per previous discussion, I am reducing it to q 6 hours prn By the way, I noticed she was recently prescribe opioid containing cough syrup I guess she would have finished it by now, just an observation but that could cause risk of overdose

## 2019-08-20 NOTE — Telephone Encounter (Signed)
Left message advising refill was sent to the pharmacy on patient's cell phone.

## 2019-08-21 DIAGNOSIS — U071 COVID-19: Secondary | ICD-10-CM | POA: Diagnosis not present

## 2019-08-21 MED FILL — LEVOCETIRIZINE 5 MG TABLET: 5 | 30 days supply | Qty: 30 | Fill #5

## 2019-08-21 MED FILL — BENZONATATE 200 MG CAP: 200 | 10 days supply | Qty: 30 | Fill #0

## 2019-08-22 ENCOUNTER — Other Ambulatory Visit (HOSPITAL_COMMUNITY): Payer: Self-pay | Admitting: Internal Medicine

## 2019-08-22 DIAGNOSIS — U071 COVID-19: Secondary | ICD-10-CM

## 2019-08-23 ENCOUNTER — Ambulatory Visit (HOSPITAL_COMMUNITY): Admission: RE | Admit: 2019-08-23 | Payer: Medicare Other | Source: Ambulatory Visit

## 2019-08-23 ENCOUNTER — Ambulatory Visit (HOSPITAL_COMMUNITY)
Admission: RE | Admit: 2019-08-23 | Discharge: 2019-08-23 | Disposition: A | Discharge status: Home / Self Care | Payer: Medicare Other | Source: Ambulatory Visit | Attending: Internal Medicine | Admitting: Internal Medicine

## 2019-08-23 ENCOUNTER — Other Ambulatory Visit: Payer: Self-pay

## 2019-08-23 ENCOUNTER — Encounter (HOSPITAL_COMMUNITY): Payer: Self-pay

## 2019-08-23 DIAGNOSIS — U071 COVID-19: Secondary | ICD-10-CM

## 2019-08-30 ENCOUNTER — Other Ambulatory Visit: Payer: Self-pay

## 2019-08-30 ENCOUNTER — Telehealth: Payer: Self-pay | Admitting: Neurology

## 2019-08-30 ENCOUNTER — Ambulatory Visit (INDEPENDENT_AMBULATORY_CARE_PROVIDER_SITE_OTHER): Payer: Medicare Other | Admitting: Neurology

## 2019-08-30 DIAGNOSIS — G43709 Chronic migraine without aura, not intractable, without status migrainosus: Secondary | ICD-10-CM

## 2019-08-30 DIAGNOSIS — IMO0002 Reserved for concepts with insufficient information to code with codable children: Secondary | ICD-10-CM

## 2019-08-30 MED ORDER — ONABOTULINUMTOXINA 100 UNITS IJ SOLR
155.0000 [IU] | Freq: Once | INTRAMUSCULAR | Status: AC
  Administered 2019-08-30: 155 [IU] via INTRAMUSCULAR

## 2019-08-30 MED ORDER — ZOLMITRIPTAN 5 MG PO TABS: ORAL_TABLET | ORAL | 11 refills | Status: DC

## 2019-08-30 NOTE — Telephone Encounter (Signed)
Patient stated her Nurtec is not really working for her and she'd like to go back to taking Zomeg.  1. [place]  If sent in after 4:30 PM, [facility] [address]

## 2019-08-30 NOTE — Progress Notes (Signed)
Botulinum Clinic   Procedure Note Botox  Attending: Dr. Metta Clines  Preoperative Diagnosis(es): Chronic migraine  Consent obtained from: The patient Benefits discussed included, but were not limited to decreased muscle tightness, increased joint range of motion, and decreased pain.  Risk discussed included, but were not limited pain and discomfort, bleeding, bruising, excessive weakness, venous thrombosis, muscle atrophy and dysphagia.  Anticipated outcomes of the procedure as well as he risks and benefits of the alternatives to the procedure, and the roles and tasks of the personnel to be involved, were discussed with the patient, and the patient consents to the procedure and agrees to proceed. A copy of the patient medication guide was given to the patient which explains the blackbox warning.  Patients identity and treatment sites confirmed Yes.  .  Details of Procedure: Skin was cleaned with alcohol. Prior to injection, the needle plunger was aspirated to make sure the needle was not within a blood vessel.  There was no blood retrieved on aspiration.    Following is a summary of the muscles injected  And the amount of Botulinum toxin used:  Dilution 200 units of Botox was reconstituted with 4 ml of preservative free normal saline. Time of reconstitution: At the time of the office visit (<30 minutes prior to injection)   Injections  155 total units of Botox was injected with a 30 gauge needle.  Injection Sites: L occipitalis: 15 units- 3 sites  R occiptalis: 15 units- 3 sites  L upper trapezius: 15 units- 3 sites R upper trapezius: 15 units- 3 sits          L paraspinal: 10 units- 2 sites R paraspinal: 10 units- 2 sites  Face L frontalis(2 injection sites):10 units   R frontalis(2 injection sites):10 units         L corrugator: 5 units   R corrugator: 5 units           Procerus: 5 units   L temporalis: 20 units R temporalis: 20 units   Agent:  200 units of botulinum Type  A (Onobotulinum Toxin type A) was reconstituted with 4 ml of preservative free normal saline.  Time of reconstitution: At the time of the office visit (<30 minutes prior to injection)     Total injected (Units): 155  Total wasted (Units): none  Patient tolerated procedure well without complications.   Reinjection is anticipated in 3 months.

## 2019-08-30 NOTE — Telephone Encounter (Signed)
Pls confirm, I have not prescribed Zomig to her, she was on Relpax. Is it Relpax that she wanted to go back on taking? Thanks

## 2019-08-30 NOTE — Telephone Encounter (Signed)
Spoke with pt she was on Zomig 5mg  before she started seeing Dr. Delice Lesch she stated that it is the only thing that helps with her migraines and would like to start it back

## 2019-08-30 NOTE — Telephone Encounter (Signed)
Please see note.

## 2019-08-30 NOTE — Telephone Encounter (Signed)
Dr. Amparo [place] patient.

## 2019-08-30 NOTE — Telephone Encounter (Signed)
Pt called per DPR cam leave voice mail voice mail left Rx for Zomig sent to [facility]. Take at onset of migraine, do not take more than 2-3 times a week, taking it more frequently can worsen headaches. Call office if you have any questions

## 2019-08-30 NOTE — Telephone Encounter (Signed)
Rx for Zomig sent to [facility]. Take at onset of migraine, do not take more than 2-3 times a week, taking it more frequently can worsen headaches. Thanks

## 2019-08-30 NOTE — Progress Notes (Signed)
Key: [id number] - PA Case ID: [id number]  PA started for ZOLMitriptan 5mg  tab   [facility]

## 2019-09-02 ENCOUNTER — Inpatient Hospital Stay: Payer: Medicare Other | Attending: Hematology and Oncology

## 2019-09-02 ENCOUNTER — Other Ambulatory Visit: Payer: Self-pay

## 2019-09-02 DIAGNOSIS — Z881 Allergy status to other antibiotic agents status: Secondary | ICD-10-CM | POA: Diagnosis not present

## 2019-09-02 DIAGNOSIS — E559 Vitamin D deficiency, unspecified: Secondary | ICD-10-CM | POA: Insufficient documentation

## 2019-09-02 DIAGNOSIS — C711 Malignant neoplasm of frontal lobe: Secondary | ICD-10-CM | POA: Diagnosis not present

## 2019-09-02 DIAGNOSIS — Z885 Allergy status to narcotic agent status: Secondary | ICD-10-CM | POA: Insufficient documentation

## 2019-09-02 DIAGNOSIS — Z8616 Personal history of COVID-19: Secondary | ICD-10-CM | POA: Diagnosis not present

## 2019-09-02 DIAGNOSIS — R569 Unspecified convulsions: Secondary | ICD-10-CM | POA: Insufficient documentation

## 2019-09-02 DIAGNOSIS — D72819 Decreased white blood cell count, unspecified: Secondary | ICD-10-CM | POA: Diagnosis not present

## 2019-09-02 DIAGNOSIS — D61818 Other pancytopenia: Secondary | ICD-10-CM | POA: Insufficient documentation

## 2019-09-02 DIAGNOSIS — Z79899 Other long term (current) drug therapy: Secondary | ICD-10-CM | POA: Diagnosis not present

## 2019-09-02 DIAGNOSIS — Z888 Allergy status to other drugs, medicaments and biological substances status: Secondary | ICD-10-CM | POA: Insufficient documentation

## 2019-09-02 DIAGNOSIS — D61810 Antineoplastic chemotherapy induced pancytopenia: Secondary | ICD-10-CM

## 2019-09-02 DIAGNOSIS — T451X5A Adverse effect of antineoplastic and immunosuppressive drugs, initial encounter: Secondary | ICD-10-CM

## 2019-09-02 DIAGNOSIS — R0602 Shortness of breath: Secondary | ICD-10-CM | POA: Insufficient documentation

## 2019-09-02 DIAGNOSIS — G44021 Chronic cluster headache, intractable: Secondary | ICD-10-CM | POA: Insufficient documentation

## 2019-09-02 DIAGNOSIS — E538 Deficiency of other specified B group vitamins: Secondary | ICD-10-CM

## 2019-09-02 LAB — CBC WITH DIFFERENTIAL/PLATELET
Abs Immature Granulocytes: 0.01 10*3/uL (ref 0.00–0.07)
Basophils Absolute: 0 10*3/uL (ref 0.0–0.1)
Basophils Relative: 1 %
Eosinophils Absolute: 0 10*3/uL (ref 0.0–0.5)
Eosinophils Relative: 1 %
HCT: 37.9 % (ref 36.0–46.0)
Hemoglobin: 12.9 g/dL (ref 12.0–15.0)
Immature Granulocytes: 0 %
Lymphocytes Relative: 43 %
Lymphs Abs: 1.3 10*3/uL (ref 0.7–4.0)
MCH: 32.9 pg (ref 26.0–34.0)
MCHC: 34 g/dL (ref 30.0–36.0)
MCV: 96.7 fL (ref 80.0–100.0)
Monocytes Absolute: 0.2 10*3/uL (ref 0.1–1.0)
Monocytes Relative: 8 %
Neutro Abs: 1.3 10*3/uL — ABNORMAL LOW (ref 1.7–7.7)
Neutrophils Relative %: 47 %
Platelets: 166 10*3/uL (ref 150–400)
RBC: 3.92 MIL/uL (ref 3.87–5.11)
RDW: 11.4 % — ABNORMAL LOW (ref 11.5–15.5)
WBC: 2.9 10*3/uL — ABNORMAL LOW (ref 4.0–10.5)
nRBC: 0 % (ref 0.0–0.2)

## 2019-09-02 LAB — VITAMIN B12: Vitamin B-12: 500 pg/mL (ref 180–914)

## 2019-09-02 LAB — VITAMIN D 25 HYDROXY (VIT D DEFICIENCY, FRACTURES): Vit D, 25-Hydroxy: 13.19 ng/mL — ABNORMAL LOW (ref 30–100)

## 2019-09-03 ENCOUNTER — Telehealth: Payer: Self-pay | Admitting: Hematology and Oncology

## 2019-09-03 ENCOUNTER — Inpatient Hospital Stay (HOSPITAL_BASED_OUTPATIENT_CLINIC_OR_DEPARTMENT_OTHER): Payer: Medicare Other | Admitting: Hematology and Oncology

## 2019-09-03 ENCOUNTER — Encounter: Payer: Self-pay | Admitting: Hematology and Oncology

## 2019-09-03 DIAGNOSIS — C711 Malignant neoplasm of frontal lobe: Secondary | ICD-10-CM | POA: Diagnosis not present

## 2019-09-03 DIAGNOSIS — D72819 Decreased white blood cell count, unspecified: Secondary | ICD-10-CM | POA: Diagnosis not present

## 2019-09-03 DIAGNOSIS — G44021 Chronic cluster headache, intractable: Secondary | ICD-10-CM | POA: Diagnosis not present

## 2019-09-03 DIAGNOSIS — G40109 Localization-related (focal) (partial) symptomatic epilepsy and epileptic syndromes with simple partial seizures, not intractable, without status epilepticus: Secondary | ICD-10-CM

## 2019-09-03 DIAGNOSIS — D61818 Other pancytopenia: Secondary | ICD-10-CM

## 2019-09-03 DIAGNOSIS — E559 Vitamin D deficiency, unspecified: Secondary | ICD-10-CM

## 2019-09-03 DIAGNOSIS — R569 Unspecified convulsions: Secondary | ICD-10-CM | POA: Diagnosis not present

## 2019-09-03 MED ORDER — ERGOCALCIFEROL 1.25 MG (50000 UT) PO CAPS: 50000.0000 [IU] | ORAL_CAPSULE | ORAL | 1 refills | Status: DC

## 2019-09-03 MED ORDER — BUTALBITAL-APAP-CAFFEINE 50-325-40 MG PO TABS: 1.0000 | ORAL_TABLET | Freq: Three times a day (TID) | ORAL | 0 refills | Status: DC | PRN

## 2019-09-03 MED FILL — VIT D2 1.25 MG (50,000 UNIT: 1.25 MG | 84 days supply | Qty: 12 | Fill #0

## 2019-09-03 MED FILL — BUTALB-ACETAMIN-CAFF 50-325: 50-325-40 | 30 days supply | Qty: 90 | Fill #0

## 2019-09-03 NOTE — Assessment & Plan Note (Signed)
She is on antiseizure medications as directed by her neurologist We discussed medication taper between Soma and lorazepam When she is due for her next refill of her lorazepam, I plan to reduce the dose of 0.5 mg, to be taken every 8 hours as needed We will keep the Soma dose as written for now I plan to reduce her medications again in 2 months when I see her back

## 2019-09-03 NOTE — Assessment & Plan Note (Signed)
She has chronic intermittent leukopenia Serum vitamin B12 level is adequate She is not symptomatic Observe only for now

## 2019-09-03 NOTE — Telephone Encounter (Signed)
Per 2/16 sch msg. Left VM with appt date and time

## 2019-09-03 NOTE — Assessment & Plan Note (Signed)
She felt that her headaches had worsened recently She did receive her first dose of Botox last week She felt that her headaches could be related to changes in weather She denies worsening seizures We discussed the risk and benefits of imaging study now versus waiting for few months She would like to wait and see I plan to repeat imaging study once a year otherwise, due around July 2021

## 2019-09-03 NOTE — Progress Notes (Signed)
HEMATOLOGY-ONCOLOGY ELECTRONIC VISIT PROGRESS NOTE  Patient Care Team: Lavone Orn, MD as PCP - General (Internal Medicine) Ashok Pall, MD as Consulting Physician (Neurosurgery) Milas Gain., MD (Inactive) as Consulting Physician (Neurosurgery) Milas Gain., MD (Inactive) as Consulting Physician (Neurosurgery) Bobbitt, [place] Muta, MD as Consulting Physician (Allergy and Immunology) Cameron Sprang, MD as Consulting Physician (Neurology)  I connected with by [facility] video conference  I discussed the limitations, risks, security and privacy concerns of performing an evaluation and management service by [facility] and the availability of in person appointments.  I also discussed with the patient that there may be a patient responsible charge related to this service. The patient expressed understanding and agreed to proceed.   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 She felt that her headaches had worsened recently She did receive her first dose of Botox last week She felt that her headaches could be related to changes in weather She denies worsening seizures We discussed the risk and benefits of imaging study now versus waiting for few months She would like to wait and see I plan to repeat imaging study once a year otherwise, due around July 2021  Intractable chronic cluster headache She has multifactorial headaches Previously, we discussed medication taper I have touch base with her neurologist who will be managing her headaches differently Previously, we have tapered her hydrocodone to every 6 hours as needed She is comfortable to get Fioricet tapered to every 8 hours as needed I plan to keep those medications at the dose above and will taper her medications again in 2 months  Simple partial seizures She is on antiseizure medications as directed by her neurologist We discussed  medication taper between Soma and lorazepam When she is due for her next refill of her lorazepam, I plan to reduce the dose of 0.5 mg, to be taken every 8 hours as needed We will keep the Soma dose as written for now I plan to reduce her medications again in 2 months when I see her back  Vitamin D deficiency She has recurrent vitamin D deficiency again I will prescribe high-dose vitamin D replacement therapy  Pancytopenia, acquired ([place]) She has chronic intermittent leukopenia Serum vitamin B12 level is adequate She is not symptomatic Observe only for now   No orders of the defined types were placed in this encounter.   INTERVAL HISTORY: Please see below for problem oriented charting. 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   09/27/2013 Pathology Results   Accession: [id number] pathology showed grade 2 oligodendroglioma with mutant IDH1 protein, ATRX, OLIG 2 expression. p53 is negative, low Ki-67 labeling index. Molecular SNP array studies demonstrate whole arm 1p19q codeletion."   10/28/2013 Imaging   Ct scan of head showed possible abscess.   11/06/2013 Imaging   Repeat CT scan of the head showed resolution of abscess.   01/23/2014 Surgery  Dr. Christella Noa perform a subtotal gross resection of the tumor.   01/24/2014 Imaging   Repeat imaging studies showed persistent  and residual disease.   03/03/2014 - 09/01/2014 Chemotherapy   She was started on Temodar, 5 days every 28 days   03/13/2014 Imaging   Repeat MRI showed no evidence of progression of disease.   07/14/2014 Imaging   Repeat MRI showed no evidence of disease progression.   09/12/2014 Imaging   MRI of the liver show mild regression of the size of lesions, presumed to be benign   09/15/2014 Imaging   Repeat MRI of the brain show persistent abnormalities, suspicious for residual disease   03/02/2015 Imaging    MRI brain at [facility] show persistent abnormalities   08/10/2015 Imaging   Repeat MRI [facility] showed stable disease   08/24/2015 - 02/08/2016 Chemotherapy   She was started on Lomustine, Procarbazine and Vincristine chemotherapy   08/30/2015 Imaging   CT chest showed no evidence of aspiration pneumonia   11/18/2015 Imaging   MRI brain showed status post resection of right frontal lobe tumor without residual or recurrent tumor. 2. Otherwise negative MRI of the brain.    03/02/2016 Imaging   Stable MRI post right frontal lobe tumor resection. Negative for recurrent tumor   06/01/2016 Imaging   Stable MRI post right frontal lobe tumor resection. No recurrent tumor identified   08/19/2016 Imaging   Unchanged examination without tumor recurrence.   11/17/2016 Imaging   Stable right frontal resection cavity. Stable focal mass effect within the right middle frontal gyrus at the superior margin of the cavity which may represent nonenhancing neoplasm. No evidence for tumor progression. Otherwise stable MRI of the brain   12/16/2016 Imaging   MR brain: 1. No acute intracranial abnormality. 2. Unchanged appearance of right frontal lobe resection cavity and surrounding hyperintense T2 weighted signal.   02/04/2017 Imaging   Repeat MRI Normal pituitary  Stable postop changes of right frontal tumor resection   05/12/2017 Imaging   The left ventricular size is normal. There is normal left ventricular  wall thickness.  Left ventricular systolic function is normal. LV ejection fraction = 60-65%.  Left ventricular filling pattern is normal. The left ventricular wall motion is normal. The right ventricle is normal in size and function. There is no significant valvular stenosis or regurgitation No pulmonary hypertension. IVC size was normal. There is no pericardial effusion. There is no comparison study available   05/13/2017 Imaging   1.No evidence of acute pulmonary thromboembolic disease. 2.There is an 8 mm right lower lobe groundglass nodule. Statistically, in this age group, this is most likely to reflect benign etiologies such as a tiny focus of infection or nonspecific inflammation. 4 mm solid right upper lobe lung nodule. A CT scan could be obtained in 12 months to further evaluate if the patient has any known risk factors for malignancy. 3.There is a 1.3 cm left adrenal nodule. This finding is incompletely characterized although and is slightly increased in size relative to the abdominal MRI from February 2016. Although this finding is most likely benign, recommend follow-up adrenal protocol CT in one year.   05/13/2017 Imaging   MRI brain 1.No acute intracranial abnormality. 2.Postsurgical changes of a right frontal craniotomy for resection of a reported oligodendroglioma. Hyperintense T2/FLAIR signal around the resection cavity and in the subcortical white matter of a gyrus along the superior margin of the cavity is unchanged from 01/05/2015. No evidence of disease progression.   12/15/2017 Imaging  MRI brain at [facility] 1.No acute intracranial abnormality. 2.Postsurgical changes of right frontal craniotomy for resection of reported oligodendroglioma. No evidence of disease progression   12/28/2017 Procedure   Successful right IJ vein Port-A-Cath explant.   04/24/2018 Imaging   Stable post treatment appearance, RIGHT frontal oligodendroglioma.  No  posttraumatic sequelae are evident.   01/30/2019 Imaging   Stable appearance of treated right frontal oligodendroglioma. No change from 04/24/2018.     REVIEW OF SYSTEMS:   Constitutional: Denies fevers, chills or abnormal weight loss Eyes: Denies blurriness of vision Ears, nose, mouth, throat, and face: Denies mucositis or sore throat Respiratory: Denies cough, dyspnea or wheezes Cardiovascular: Denies palpitation, chest discomfort Gastrointestinal:  Denies nausea, heartburn or change in bowel habits Skin: Denies abnormal skin rashes Lymphatics: Denies new lymphadenopathy or easy bruising Neurological:Denies numbness, tingling or new weaknesses Behavioral/Psych: Mood is stable, no new changes  Extremities: No lower extremity edema All other systems were reviewed with the patient and are negative.  I have reviewed the past medical history, past surgical history, social history and family history with the patient and they are unchanged from previous note.  ALLERGIES:  is allergic to adhesive [tape]; latex; medical adhesive remover; percocet [oxycodone-acetaminophen]; prednisone; shellfish allergy; soma [carisoprodol]; tramadol hcl; vancomycin; and zofran [ondansetron hcl].  MEDICATIONS:  Current Outpatient Medications  Medication Sig Dispense Refill  . butalbital-acetaminophen-caffeine (FIORICET) 50-325-40 MG tablet Take 1 tablet by mouth every 8 (eight) hours as needed for headache. 90 tablet 0  . carisoprodol (SOMA) 350 MG tablet TAKE 1 TABLET BY MOUTH 4 TIMES DAILY FOR SEIZURE PREVENTION 360 tablet 1  . Cyanocobalamin (VITAMIN B-12 PO) Take 5,000 Units daily by mouth.     . divalproex (DEPAKOTE ER) 250 MG 24 hr tablet Take 2 tabs in AM, 3 tabs in PM 450 tablet 3  . eletriptan (RELPAX) 20 MG tablet Take 1 tablet as needed for migraine. Do not take more than 2-3 a week 10 tablet 5  . EPINEPHRINE 0.3 mg/0.3 mL IJ SOAJ injection INJECT 0.3 MLS (ONE SYRINGE) INTO THE MUSCLE ONCE FOR 1  DOSE AS NEEDED. (Patient taking differently: Inject 0.3 mg into the muscle once. ) 0.3 mL 0  . ergocalciferol (VITAMIN D2) 1.25 MG (50000 UT) capsule Take 1 capsule (50,000 Units total) by mouth once a week. 12 capsule 1  . gabapentin (NEURONTIN) 300 MG capsule Take 4 capsules twice a day 720 capsule 3  . HYDROcodone-acetaminophen (NORCO) 10-325 MG tablet Take 1 tablet by mouth every 6 (six) hours as needed. 90 tablet 0  . levocetirizine (XYZAL) 5 MG tablet Take 1 tablet (5 mg total) by mouth every evening. 30 tablet 5  . LORazepam (ATIVAN) 1 MG tablet Take 1 tablet (1 mg total) by mouth 2 (two) times daily as needed for anxiety. 90 tablet 0  . montelukast (SINGULAIR) 10 MG tablet Take 1 tablet (10 mg total) by mouth at bedtime. 30 tablet 9  . naproxen (NAPROSYN) 250 MG tablet Take by mouth 3 (three) times daily with meals.    . Rimegepant Sulfate (NURTEC) 75 MG TBDP Take 1 tablet by mouth as needed (Take 1 tablet as needed for migraine. Do not take more than 2-3 times a week). 10 tablet 11  . zolmitriptan (ZOMIG) 5 MG tablet Take 1 tablet as needed at onset of migraine. Do not take more than 2-3 a week 10 tablet 11   No current facility-administered medications for this visit.    PHYSICAL EXAMINATION: ECOG PERFORMANCE STATUS:  1 - Symptomatic but completely ambulatory  LABORATORY DATA:  I have reviewed the data as listed CMP Latest Ref Rng & Units 08/07/2019 03/26/2018 09/21/2017  Glucose 65 - 99 mg/dL 102(H) 92 88  BUN 6 - 20 mg/dL '12 11 11  ' Creatinine 0.57 - 1.00 mg/dL 0.62 0.55 0.71  Sodium 134 - 144 mmol/L 140 140 139  Potassium 3.5 - 5.2 mmol/L 4.8 3.6 4.0  Chloride 96 - 106 mmol/L 102 105 104  CO2 20 - 29 mmol/L '28 26 28  ' Calcium 8.7 - 10.2 mg/dL 9.0 9.1 9.5  Total Protein 6.4 - 8.3 g/dL - - 6.8  Total Bilirubin 0.2 - 1.2 mg/dL - - 0.4  Alkaline Phos 40 - 150 U/L - - 49  AST 5 - 34 U/L - - 17  ALT 0 - 55 U/L - - 18    Lab Results  Component Value Date   WBC 2.9 (L) 09/02/2019    HGB 12.9 09/02/2019   HCT 37.9 09/02/2019   MCV 96.7 09/02/2019   PLT 166 09/02/2019   NEUTROABS 1.3 (L) 09/02/2019     RADIOGRAPHIC STUDIES: I have personally reviewed the radiological images as listed and agreed with the findings in the report. DG Chest 2 View  Result Date: 08/24/2019 CLINICAL DATA:  COVID-19 EXAM: CHEST - 2 VIEW COMPARISON:  08/09/2019 FINDINGS: The heart size and mediastinal contours are within normal limits. Both lungs are clear. The visualized skeletal structures are unremarkable. IMPRESSION: No acute abnormality of the lungs. Electronically Signed   By: Eddie Candle M.D.   On: 08/24/2019 17:17   DG Chest 2 View  Result Date: 08/09/2019 CLINICAL DATA:  Shortness of breath.  COVID-19 positive. EXAM: CHEST - 2 VIEW COMPARISON:  May 21, 2019 FINDINGS: Lungs are clear. Heart size and pulmonary vascularity are normal. No adenopathy. There is midthoracic dextroscoliosis with upper lumbar levoscoliosis. IMPRESSION: Lungs clear. Cardiac silhouette normal. No adenopathy. Areas of scoliosis noted. Electronically Signed   By: Lowella Grip III M.D.   On: 08/09/2019 14:28    I discussed the assessment and treatment plan with the patient. The patient was provided an opportunity to ask questions and all were answered. The patient agreed with the plan and demonstrated an understanding of the instructions. The patient was advised to call back or seek an in-person evaluation if the symptoms worsen or if the condition fails to improve as anticipated.    I spent 30 minutes for the appointment reviewing test results, discuss management and coordination of care.  Heath Lark, MD 09/03/2019 9:29 AM

## 2019-09-03 NOTE — Assessment & Plan Note (Signed)
She has recurrent vitamin D deficiency again I will prescribe high-dose vitamin D replacement therapy

## 2019-09-03 NOTE — Assessment & Plan Note (Signed)
She has multifactorial headaches Previously, we discussed medication taper I have touch base with her neurologist who will be managing her headaches differently Previously, we have tapered her hydrocodone to every 6 hours as needed She is comfortable to get Fioricet tapered to every 8 hours as needed I plan to keep those medications at the dose above and will taper her medications again in 2 months

## 2019-09-09 MED FILL — CARISOPRODOL 350 MG TABS: 350 | 90 days supply | Qty: 360 | Fill #1

## 2019-09-10 ENCOUNTER — Other Ambulatory Visit: Payer: Self-pay | Admitting: Hematology and Oncology

## 2019-09-10 ENCOUNTER — Telehealth: Payer: Self-pay

## 2019-09-10 DIAGNOSIS — C719 Malignant neoplasm of brain, unspecified: Secondary | ICD-10-CM

## 2019-09-10 MED ORDER — HYDROCODONE-ACETAMINOPHEN 10-325 MG PO TABS: 1.0000 | ORAL_TABLET | Freq: Four times a day (QID) | ORAL | 0 refills | Status: DC | PRN

## 2019-09-10 MED FILL — ZOLMitriptan 5 MG TABS: 5 | 23 days supply | Qty: 10 | Fill #0

## 2019-09-10 MED FILL — HYDROCODON-APAP 10-325: 10-325 | 22 days supply | Qty: 90 | Fill #0

## 2019-09-10 NOTE — Progress Notes (Signed)
PA in covermymeds was denied will have to do an appeal to get pt medication covered

## 2019-09-10 NOTE — Telephone Encounter (Signed)
Request for Hydrocodone refill.   Last Rx was for 2/2 Hydrocodone 10-325 1 tablet Q 6 hours PRN.  #90.

## 2019-09-10 NOTE — Telephone Encounter (Signed)
Refill sent.

## 2019-09-10 NOTE — Telephone Encounter (Signed)
Dr. Alvy Bimler, Sending to you for approval or denial. Gardiner Rhyme, RN

## 2019-09-10 NOTE — Telephone Encounter (Signed)
Message left for patient notify that Rx has been sent to pharmacy.

## 2019-09-16 NOTE — Progress Notes (Signed)
Victoria Bates Key: [id number] - PA Case ID: [id number] Need help? Call us at [phone] Outcome Deniedon February 12 Denied. This drug is not covered on the formulary. We are denying your request because we do not show that you have tried at least 2 covered drugs that can treat your condition. Other covered drug(s) is/are: Aimovig subcutaneous solution auto-injector (70 mg/ml, 140 mg/ml) - prior authorization required, Ergotamine-caffeine oral tablet 1-100 mg, Rizatriptan benzoate oral tablet (5 mg, 10 mg), Rizatriptan benzoate oral tablet dispersible (5 mg, 10 mg), Sumatriptan nasal solution (5 mg/act, 20 mg/act), Sumatriptan succinate oral tablet (25 mg, 50 mg, 100 mg), Sumatriptan succinate refill subcutaneous solution cartridge (4 mg/0.40ml, 6 mg/0.5ml), Sumatriptan succinate subcutaneous solution auto-injector (4 mg/0.20ml, 6 mg/0.31ml), Sumatriptan succinate subcutaneous solution prefilled syringe 6 mg/0.66ml, Propranolol hcl er oral capsule 24 hour (60 mg, 80 mg, 120 mg, 160 mg), Propranolol hcl oral tablet (10 mg, 20 mg, 40 mg, 60 mg, 80 mg), Topiramate oral tablet (25 mg, 50 mg, 100 mg, 200 mg), Atenolol oral tablet (25 mg, 50 mg, 100 mg), Bisoprolol fumarate oral tablet (5 mg, 10 mg), Celecoxib oral capsule (50 mg, 100 mg, 200 mg, 400 mg), Diclofenac potassium oral tablet 50 mg, Divalproex sodium oral tablet delayed release (125 mg, 250 mg, 500 mg), Metoprolol tartrate oral tablet (25 mg, 50 mg, 100 mg), Verapamil hcl oral tablet (40 mg, 80 mg, 120 mg), Verapamil hcl er oral tablet extended release (120 mg, 180 mg, 240 mg). We may be able to make an exception to cover this drug. Your doctor will need to send [place] medical records showing that you tried this drug. If you cannot take the covered drug, your doctor will need to tell us why. Note: Some covered drug(s) may have quantity limits. Please refer to the formulary for details. Drug ZOLMitriptan 5MG  dispersible tablets Form [facility]  Electronic Prior Authorization Request Form

## 2019-09-17 NOTE — Progress Notes (Signed)
Called the [facility] due to PA being denied and patient had already picked up medication- paid out of pocket which wasn't too much. So therefore patient already has this medication without the PA.

## 2019-09-19 ENCOUNTER — Other Ambulatory Visit: Payer: Self-pay | Admitting: Hematology and Oncology

## 2019-09-20 ENCOUNTER — Telehealth: Payer: Self-pay

## 2019-09-20 ENCOUNTER — Other Ambulatory Visit: Payer: Self-pay | Admitting: Hematology and Oncology

## 2019-09-20 MED ORDER — LORAZEPAM 0.5 MG PO TABS: 0.5000 mg | ORAL_TABLET | Freq: Three times a day (TID) | ORAL | 0 refills | Status: DC | PRN

## 2019-09-20 MED FILL — LORazepam 0.5 MG TABS: 0.5 | 30 days supply | Qty: 90 | Fill #0

## 2019-09-20 NOTE — Telephone Encounter (Signed)
Called and left below message. Ask her to call the office back. 

## 2019-09-20 NOTE — Telephone Encounter (Signed)
Lorazepam is refilled

## 2019-09-20 NOTE — Telephone Encounter (Signed)
-----   Message from Heath Lark, MD sent at 09/20/2019  9:18 AM EST ----- Regarding: fioricet and ativan refill I received request for both It's too soon to refill fioricet Per discussion before I am planning to refill lorazepam at 0.5 mg q 8 hours/prn Please talk to her first before I refill the lorazepam and the earliest we can refill fioricet is 3/15

## 2019-09-20 NOTE — Telephone Encounter (Signed)
Attempted to call Natsuki again with no answer. [employer] and given below message. He verbalized understanding. He said Krishana is sleeping today, she had a rough day yesterday. He is agreeable to lower Lorazepam dose and is aware that Fioricet will not be filled until 3/15.

## 2019-09-23 ENCOUNTER — Encounter: Payer: Self-pay | Admitting: Hematology and Oncology

## 2019-09-30 ENCOUNTER — Other Ambulatory Visit: Payer: Self-pay | Admitting: Hematology and Oncology

## 2019-09-30 ENCOUNTER — Encounter: Payer: Self-pay | Admitting: Hematology and Oncology

## 2019-09-30 ENCOUNTER — Telehealth: Payer: Self-pay

## 2019-09-30 DIAGNOSIS — C719 Malignant neoplasm of brain, unspecified: Secondary | ICD-10-CM

## 2019-09-30 NOTE — Telephone Encounter (Signed)
He called and left a message asking for a refill on Hydrocodone and Fioricet.  Called back and told Dr. Alvy Bimler would fill tomorrow. That would be the earliest fill date. He verbalized understanding.

## 2019-10-01 MED ORDER — BUTALBITAL-APAP-CAFFEINE 50-325-40 MG PO TABS: 1.0000 | ORAL_TABLET | Freq: Three times a day (TID) | ORAL | 0 refills | Status: DC | PRN

## 2019-10-01 MED ORDER — HYDROCODONE-ACETAMINOPHEN 10-325 MG PO TABS: 1.0000 | ORAL_TABLET | Freq: Four times a day (QID) | ORAL | 0 refills | Status: DC | PRN

## 2019-10-01 MED FILL — HYDROCODON-APAP 10-325: 10-325 | 23 days supply | Qty: 90 | Fill #0

## 2019-10-01 MED FILL — BUTALBITAL-APAP-CAFFEINE 50: 50-325-40 | 30 days supply | Qty: 90 | Fill #0

## 2019-10-21 ENCOUNTER — Other Ambulatory Visit: Payer: Self-pay | Admitting: Hematology and Oncology

## 2019-10-21 ENCOUNTER — Encounter: Payer: Self-pay | Admitting: Hematology and Oncology

## 2019-10-21 DIAGNOSIS — C719 Malignant neoplasm of brain, unspecified: Secondary | ICD-10-CM

## 2019-10-21 NOTE — Telephone Encounter (Signed)
Earliest refill is tomorrow I will refill tomorrow first thing in the morning

## 2019-10-21 NOTE — Telephone Encounter (Signed)
Please review for refill.  

## 2019-10-22 ENCOUNTER — Other Ambulatory Visit: Payer: Self-pay | Admitting: Hematology and Oncology

## 2019-10-22 DIAGNOSIS — C719 Malignant neoplasm of brain, unspecified: Secondary | ICD-10-CM

## 2019-10-22 MED ORDER — HYDROCODONE-ACETAMINOPHEN 10-325 MG PO TABS: 1.0000 | ORAL_TABLET | Freq: Four times a day (QID) | ORAL | 0 refills | Status: DC | PRN

## 2019-10-22 MED FILL — HYDROCODON-APAP 10-325: 10-325 | 22 days supply | Qty: 90 | Fill #0

## 2019-10-30 ENCOUNTER — Other Ambulatory Visit: Payer: Self-pay | Admitting: Hematology and Oncology

## 2019-10-31 ENCOUNTER — Other Ambulatory Visit: Payer: Self-pay | Admitting: Hematology and Oncology

## 2019-11-01 ENCOUNTER — Encounter: Payer: Self-pay | Admitting: Hematology and Oncology

## 2019-11-01 ENCOUNTER — Inpatient Hospital Stay: Payer: Medicare Other | Attending: Hematology and Oncology | Admitting: Hematology and Oncology

## 2019-11-01 ENCOUNTER — Other Ambulatory Visit: Payer: Self-pay | Admitting: Hematology and Oncology

## 2019-11-01 DIAGNOSIS — Z8616 Personal history of COVID-19: Secondary | ICD-10-CM | POA: Diagnosis not present

## 2019-11-01 DIAGNOSIS — D696 Thrombocytopenia, unspecified: Secondary | ICD-10-CM

## 2019-11-01 DIAGNOSIS — Z9221 Personal history of antineoplastic chemotherapy: Secondary | ICD-10-CM | POA: Diagnosis not present

## 2019-11-01 DIAGNOSIS — R519 Headache, unspecified: Secondary | ICD-10-CM

## 2019-11-01 DIAGNOSIS — G40109 Localization-related (focal) (partial) symptomatic epilepsy and epileptic syndromes with simple partial seizures, not intractable, without status epilepticus: Secondary | ICD-10-CM | POA: Diagnosis not present

## 2019-11-01 DIAGNOSIS — R110 Nausea: Secondary | ICD-10-CM | POA: Insufficient documentation

## 2019-11-01 DIAGNOSIS — T50905D Adverse effect of unspecified drugs, medicaments and biological substances, subsequent encounter: Secondary | ICD-10-CM

## 2019-11-01 DIAGNOSIS — M542 Cervicalgia: Secondary | ICD-10-CM | POA: Diagnosis not present

## 2019-11-01 DIAGNOSIS — Z79899 Other long term (current) drug therapy: Secondary | ICD-10-CM | POA: Insufficient documentation

## 2019-11-01 DIAGNOSIS — R202 Paresthesia of skin: Secondary | ICD-10-CM

## 2019-11-01 DIAGNOSIS — R4702 Dysphasia: Secondary | ICD-10-CM

## 2019-11-01 DIAGNOSIS — R0789 Other chest pain: Secondary | ICD-10-CM | POA: Diagnosis not present

## 2019-11-01 DIAGNOSIS — G44021 Chronic cluster headache, intractable: Secondary | ICD-10-CM | POA: Diagnosis not present

## 2019-11-01 DIAGNOSIS — Z888 Allergy status to other drugs, medicaments and biological substances status: Secondary | ICD-10-CM | POA: Diagnosis not present

## 2019-11-01 DIAGNOSIS — D61818 Other pancytopenia: Secondary | ICD-10-CM | POA: Diagnosis not present

## 2019-11-01 DIAGNOSIS — E559 Vitamin D deficiency, unspecified: Secondary | ICD-10-CM | POA: Diagnosis not present

## 2019-11-01 DIAGNOSIS — C719 Malignant neoplasm of brain, unspecified: Secondary | ICD-10-CM

## 2019-11-01 DIAGNOSIS — Z885 Allergy status to narcotic agent status: Secondary | ICD-10-CM | POA: Diagnosis not present

## 2019-11-01 DIAGNOSIS — C711 Malignant neoplasm of frontal lobe: Secondary | ICD-10-CM | POA: Diagnosis not present

## 2019-11-01 DIAGNOSIS — Z881 Allergy status to other antibiotic agents status: Secondary | ICD-10-CM | POA: Insufficient documentation

## 2019-11-01 MED ORDER — HYDROCODONE-ACETAMINOPHEN 10-325 MG PO TABS: 1.0000 | ORAL_TABLET | Freq: Three times a day (TID) | ORAL | 0 refills | Status: DC | PRN

## 2019-11-01 MED ORDER — BUTALBITAL-APAP-CAFFEINE 50-325-40 MG PO TABS: 1.0000 | ORAL_TABLET | Freq: Three times a day (TID) | ORAL | 0 refills | Status: DC | PRN

## 2019-11-01 MED ORDER — CARISOPRODOL 350 MG PO TABS: 350.0000 mg | ORAL_TABLET | Freq: Three times a day (TID) | ORAL | 1 refills | Status: DC

## 2019-11-01 MED FILL — BUTALB-ACETAMIN-CAFF 50-325: 50-325-40 | 30 days supply | Qty: 90 | Fill #0

## 2019-11-01 MED FILL — DIVALPROEX SOD ER 250 MG TA: 250 | 90 days supply | Qty: 450 | Fill #2

## 2019-11-01 MED FILL — LORazepam 0.5 MG TABS: 0.5 | 30 days supply | Qty: 90 | Fill #0

## 2019-11-01 MED FILL — GABAPENTIN 300 MG CAPSULE: 300 | 90 days supply | Qty: 720 | Fill #3

## 2019-11-01 NOTE — Assessment & Plan Note (Signed)
She is on antiseizure medications as directed by her neurologist She felt that the frequencies of seizures and the intensity of her headaches are worse With agree on getting MRI done The patient would like to also get Depakote level done which I think is reasonable but I advised her to discuss results with her neurologist for further management We discussed medication taper between Soma and lorazepam When she is due for refill of Soma next time, I plan to reduce it to 3 times a day as needed but to keep lorazepam at the dose of 0.5 mg, to be taken every 8 hours as needed

## 2019-11-01 NOTE — Assessment & Plan Note (Signed)
She felt that the increased frequency of headaches cannot be explained by other reasons We discussed the role of getting MRI evaluation and she agreed to proceed

## 2019-11-01 NOTE — Progress Notes (Signed)
HEMATOLOGY-ONCOLOGY ELECTRONIC VISIT PROGRESS NOTE  Patient Care Team: Lavone Orn, MD as PCP - General (Internal Medicine) Ashok Pall, MD as Consulting Physician (Neurosurgery) Milas Gain., MD (Inactive) as Consulting Physician (Neurosurgery) Milas Gain., MD (Inactive) as Consulting Physician (Neurosurgery) Bobbitt, Sed[place] Muta, MD as Consulting Physician (Allergy and Immunology) Cameron Sprang, MD as Consulting Physician (Neurology)  I connected with by [facility] video conference   I also discussed with the patient that there may be a patient responsible charge related to this service. The patient expressed understanding and agreed to proceed.   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 She felt that the increased frequency of headaches cannot be explained by other reasons We discussed the role of getting MRI evaluation and she agreed to proceed  Simple partial seizures She is on antiseizure medications as directed by her neurologist She felt that the frequencies of seizures and the intensity of her headaches are worse With agree on getting MRI done The patient would like to also get Depakote level done which I think is reasonable but I advised her to discuss results with her neurologist for further management We discussed medication taper between Soma and lorazepam When she is due for refill of Soma next time, I plan to reduce it to 3 times a day as needed but to keep lorazepam at the dose of 0.5 mg, to be taken every 8 hours as needed  Intractable chronic cluster headache She has multifactorial headaches Previously, we discussed medication taper I will proceed to refill Fioricet at every 8 hours as needed but recommend, when she is due for refill of hydrocodone, I will reduce it to every 8 hours as needed as well I plan to keep those medications at the dose above and will taper her  medications again in around June  Vitamin D deficiency I will repeat vitamin D level in the next blood draw  Pancytopenia, acquired [facility]) 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 MR BRAIN W WO CONTRAST    Standing Status:   Future    Standing Expiration Date:   10/31/2020    Order Specific Question:   If indicated for the ordered procedure, I authorize the administration of contrast media per Radiology protocol    Answer:   Yes    Order Specific Question:   What is the patient's sedation requirement?    Answer:   Oral Sedation    Order Specific Question:   Does the patient have a pacemaker or implanted devices?    Answer:   No    Order Specific Question:   Radiology Contrast Protocol - do NOT remove file path    Answer:   [email or web address]    Order Specific Question:   Preferred imaging location?    Answer:   [facility] (table limit-350 lbs)  . CBC with Differential/Platelet    Standing Status:   Future    Standing Expiration Date:   12/05/2020  . Comprehensive metabolic panel    Standing Status:   Future    Standing Expiration Date:   12/05/2020  . VITAMIN D 25 Hydroxy (Vit-D Deficiency, Fractures)    Standing Status:   Future  Standing Expiration Date:   10/31/2020  . Valproic acid level    Standing Status:   Future    Standing Expiration Date:   10/31/2020    INTERVAL HISTORY: Please see below for problem oriented charting. 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   09/27/2013 Pathology Results   Accession: [id number] pathology showed grade 2 oligodendroglioma with mutant IDH1 protein, ATRX, OLIG 2 expression. p53 is negative, low Ki-67 labeling index. Molecular SNP array studies demonstrate whole arm 1p19q codeletion."   10/28/2013 Imaging   Ct scan of head showed possible abscess.   11/06/2013 Imaging   Repeat CT scan of the head showed resolution of abscess.   01/23/2014 Surgery   Dr. Christella Noa perform a subtotal gross resection of the tumor.   01/24/2014 Imaging   Repeat imaging studies showed persistent and residual disease.   03/03/2014 - 09/01/2014 Chemotherapy   She was started on Temodar, 5 days every 28 days   03/13/2014 Imaging   Repeat MRI showed no evidence of progression of disease.   07/14/2014 Imaging   Repeat MRI showed no evidence of disease progression.   09/12/2014 Imaging   MRI of the liver show mild regression of the size of lesions, presumed to be benign   09/15/2014 Imaging   Repeat MRI of the brain show persistent abnormalities, suspicious for residual disease   03/02/2015 Imaging    MRI brain at [place] show persistent abnormalities   08/10/2015 Imaging   Repeat MRI [facility] showed stable disease   08/24/2015 - 02/08/2016 Chemotherapy   She was started on Lomustine, Procarbazine and Vincristine chemotherapy   08/30/2015 Imaging   CT chest showed no evidence of aspiration pneumonia   11/18/2015 Imaging   MRI brain showed status post resection of right frontal lobe  tumor without residual or recurrent tumor. 2. Otherwise negative MRI of the brain.    03/02/2016 Imaging   Stable MRI post right frontal lobe tumor resection. Negative for recurrent tumor   06/01/2016 Imaging   Stable MRI post right frontal lobe tumor resection. No recurrent tumor identified   08/19/2016 Imaging   Unchanged examination without tumor recurrence.   11/17/2016 Imaging   Stable right frontal resection cavity. Stable focal mass effect within the right middle frontal gyrus at the superior margin of the cavity which may represent nonenhancing neoplasm. No evidence for tumor progression. Otherwise stable MRI of the brain   12/16/2016 Imaging   MR brain: 1. No acute intracranial abnormality. 2. Unchanged appearance of right frontal lobe resection cavity and surrounding hyperintense T2 weighted signal.   02/04/2017 Imaging   Repeat MRI Normal pituitary  Stable postop changes of right frontal tumor resection   05/12/2017 Imaging   The left ventricular size is normal. There is normal left ventricular wall thickness.  Left  ventricular systolic function is normal. LV ejection fraction = 60-65%.  Left ventricular filling pattern is normal. The left ventricular wall motion is normal. The right ventricle is normal in size and function. There is no significant valvular stenosis or regurgitation No pulmonary hypertension. IVC size was normal. There is no pericardial effusion. There is no comparison study available   05/13/2017 Imaging   1.No evidence of acute pulmonary thromboembolic disease. 2.There is an 8 mm right lower lobe groundglass nodule. Statistically, in this age group, this is most likely to reflect benign etiologies such as a tiny focus of infection or nonspecific inflammation. 4 mm solid right upper lobe lung nodule. A CT scan could be obtained in 12 months to further evaluate if the patient has any known risk factors for malignancy. 3.There is a 1.3 cm left  adrenal nodule. This finding is incompletely characterized although and is slightly increased in size relative to the abdominal MRI from February 2016. Although this finding is most likely benign, recommend follow-up adrenal protocol CT in one year.   05/13/2017 Imaging   MRI brain 1.No acute intracranial abnormality. 2.Postsurgical changes of a right frontal craniotomy for resection of a reported oligodendroglioma. Hyperintense T2/FLAIR signal around the resection cavity and in the subcortical white matter of a gyrus along the superior margin of the cavity is unchanged from 01/05/2015. No evidence of disease progression.   12/15/2017 Imaging   [facility] 1.No acute intracranial abnormality. 2.Postsurgical changes of right frontal craniotomy for resection of reported oligodendroglioma. No evidence of disease progression   12/28/2017 Procedure   Successful right IJ vein Port-A-Cath explant.   04/24/2018 Imaging   Stable post treatment appearance, RIGHT frontal oligodendroglioma.  No posttraumatic sequelae are evident.   01/30/2019 Imaging   Stable appearance of treated right frontal oligodendroglioma. No change from 04/24/2018.     REVIEW OF SYSTEMS: She appears to have issues with memory recall Constitutional: Denies fevers, chills or abnormal weight loss Eyes: Denies blurriness of vision Ears, nose, mouth, throat, and face: Denies mucositis or sore throat Respiratory: Denies cough, dyspnea or wheezes Cardiovascular: Denies palpitation, chest discomfort Gastrointestinal:  Denies nausea, heartburn or change in bowel habits Skin: Denies abnormal skin rashes Lymphatics: Denies new lymphadenopathy  Behavioral/Psych: Mood is stable, no new changes  Extremities: No lower extremity edema All other systems were reviewed with the patient and are negative.  I have reviewed the past medical history, past surgical history, social history and family history with the patient  and they are unchanged from previous note.  ALLERGIES:  is allergic to adhesive [tape]; latex; medical adhesive remover; percocet [oxycodone-acetaminophen]; prednisone; shellfish allergy; soma [carisoprodol]; tramadol hcl; vancomycin; and zofran [ondansetron hcl].  MEDICATIONS:  Current Outpatient Medications  Medication Sig Dispense Refill  . butalbital-acetaminophen-caffeine (FIORICET) 50-325-40 MG tablet Take 1 tablet by mouth every 8 (eight) hours as needed for headache. 90 tablet 0  . carisoprodol (SOMA) 350 MG tablet Take 1 tablet (350 mg total) by mouth 3 (three) times daily. 270 tablet 1  . Cyanocobalamin (VITAMIN B-12 PO) Take 5,000 Units daily by mouth.     . divalproex (DEPAKOTE ER) 250 MG 24 hr tablet Take 2 tabs in AM, 3 tabs in PM 450 tablet 3  . eletriptan (RELPAX) 20 MG tablet Take 1 tablet as needed for migraine. Do not take more than 2-3 a week 10 tablet 5  . EPINEPHRINE 0.3 mg/0.3 mL IJ SOAJ injection INJECT 0.3 MLS (ONE SYRINGE) INTO THE MUSCLE ONCE FOR 1  DOSE AS NEEDED. (Patient taking differently: Inject 0.3 mg into the muscle once. ) 0.3 mL 0  . ergocalciferol (VITAMIN D2) 1.25 MG (50000 UT) capsule Take 1 capsule (50,000 Units total) by mouth once a week. 12 capsule 1  . gabapentin (NEURONTIN) 300 MG capsule Take 4 capsules twice a day 720 capsule 3  . HYDROcodone-acetaminophen (NORCO) 10-325 MG tablet Take 1 tablet by mouth 3 (three) times daily as needed. 90 tablet 0  . levocetirizine (XYZAL) 5 MG tablet Take 1 tablet (5 mg total) by mouth every evening. 30 tablet 5  . LORazepam (ATIVAN) 0.5 MG tablet Take 1 tablet (0.5 mg total) by mouth every 8 (eight) hours as needed for anxiety or seizure. 90 tablet 0  . montelukast (SINGULAIR) 10 MG tablet Take 1 tablet (10 mg total) by mouth at bedtime. 30 tablet 9  . naproxen (NAPROSYN) 250 MG tablet Take by mouth 3 (three) times daily with meals.    . Rimegepant Sulfate (NURTEC) 75 MG TBDP Take 1 tablet by mouth as needed (Take 1  tablet as needed for migraine. Do not take more than 2-3 times a week). 10 tablet 11  . zolmitriptan (ZOMIG) 5 MG tablet Take 1 tablet as needed at onset of migraine. Do not take more than 2-3 a week 10 tablet 11   No current facility-administered medications for this visit.    PHYSICAL EXAMINATION: ECOG PERFORMANCE STATUS: 1 - Symptomatic but completely ambulatory  LABORATORY DATA:  I have reviewed the data as listed CMP Latest Ref Rng & Units 08/07/2019 03/26/2018 09/21/2017  Glucose 65 - 99 mg/dL 102(H) 92 88  BUN 6 - 20 mg/dL '12 11 11  ' Creatinine 0.57 - 1.00 mg/dL 0.62 0.55 0.71  Sodium 134 - 144 mmol/L 140 140 139  Potassium 3.5 - 5.2 mmol/L 4.8 3.6 4.0  Chloride 96 - 106 mmol/L 102 105 104  CO2 20 - 29 mmol/L '28 26 28  ' Calcium 8.7 - 10.2 mg/dL 9.0 9.1 9.5  Total Protein 6.4 - 8.3 g/dL - - 6.8  Total Bilirubin 0.2 - 1.2 mg/dL - - 0.4  Alkaline Phos 40 - 150 U/L - - 49  AST 5 - 34 U/L - - 17  ALT 0 - 55 U/L - - 18    Lab Results  Component Value Date   WBC 2.9 (L) 09/02/2019   HGB 12.9 09/02/2019   HCT 37.9 09/02/2019   MCV 96.7 09/02/2019   PLT 166 09/02/2019   NEUTROABS 1.3 (L) 09/02/2019   I discussed the assessment and treatment plan with the patient. The patient was provided an opportunity to ask questions and all were answered. The patient agreed with the plan and demonstrated an understanding of the instructions. The patient was advised to call back or seek an in-person evaluation if the symptoms worsen or if the condition fails to improve as anticipated.    I spent 30 minutes for the appointment reviewing test results, discuss management and coordination of care.  Heath Lark, MD 11/01/2019 9:26 AM

## 2019-11-01 NOTE — Assessment & Plan Note (Signed)
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

## 2019-11-01 NOTE — Telephone Encounter (Signed)
done

## 2019-11-01 NOTE — Assessment & Plan Note (Signed)
She has multifactorial headaches Previously, we discussed medication taper I will proceed to refill Fioricet at every 8 hours as needed but recommend, when she is due for refill of hydrocodone, I will reduce it to every 8 hours as needed as well I plan to keep those medications at the dose above and will taper her medications again in around June

## 2019-11-01 NOTE — Assessment & Plan Note (Signed)
I will repeat vitamin D level in the next blood draw

## 2019-11-04 ENCOUNTER — Other Ambulatory Visit: Payer: Self-pay | Admitting: Hematology and Oncology

## 2019-11-04 MED FILL — LEVOCETIRIZINE 5 MG TABLET: 5 | 30 days supply | Qty: 30 | Fill #0

## 2019-11-05 ENCOUNTER — Ambulatory Visit (HOSPITAL_COMMUNITY): Payer: Medicare Other

## 2019-11-06 ENCOUNTER — Telehealth: Payer: Self-pay

## 2019-11-06 NOTE — Telephone Encounter (Signed)
Called and left a message asking her to call the office about upcoming appts. Dr. Alvy Bimler appt needs to be moved to 5/4 after the MRI on 5/3.

## 2019-11-07 ENCOUNTER — Telehealth: Payer: Self-pay | Admitting: Hematology and Oncology

## 2019-11-07 NOTE — Telephone Encounter (Signed)
Scheduled per 04/16 scheduled message, called and left a voicemail.

## 2019-11-11 ENCOUNTER — Inpatient Hospital Stay: Payer: Medicare Other

## 2019-11-12 ENCOUNTER — Other Ambulatory Visit: Payer: Self-pay | Admitting: Hematology and Oncology

## 2019-11-12 ENCOUNTER — Telehealth: Payer: Self-pay

## 2019-11-12 ENCOUNTER — Inpatient Hospital Stay: Payer: Medicare Other | Admitting: Hematology and Oncology

## 2019-11-12 DIAGNOSIS — C719 Malignant neoplasm of brain, unspecified: Secondary | ICD-10-CM

## 2019-11-12 MED ORDER — HYDROCODONE-ACETAMINOPHEN 10-325 MG PO TABS: 1.0000 | ORAL_TABLET | Freq: Three times a day (TID) | ORAL | 0 refills | Status: DC | PRN

## 2019-11-12 MED FILL — HYDROCODON-APAP 10-325: 10-325 | 30 days supply | Qty: 90 | Fill #0

## 2019-11-12 NOTE — Telephone Encounter (Signed)
She called requesting a refill on Hydrocodone Rx.  [name] called and requested a refill on Hydrocodone Rx.  On 4/16 Rx has no print on Rx.

## 2019-11-12 NOTE — Telephone Encounter (Signed)
Called Victoria Bates and told him Rx sent to pharmacy. He verbalized understanding.

## 2019-11-12 NOTE — Telephone Encounter (Signed)
Done Per discussion from recent visit, it is not every 8 hours as needed, no every 6 hours as needed

## 2019-11-18 ENCOUNTER — Inpatient Hospital Stay: Payer: Medicare Other | Attending: Hematology and Oncology

## 2019-11-18 ENCOUNTER — Encounter: Payer: Self-pay | Admitting: *Deleted

## 2019-11-18 ENCOUNTER — Other Ambulatory Visit: Payer: Self-pay

## 2019-11-18 ENCOUNTER — Ambulatory Visit (HOSPITAL_COMMUNITY)
Admission: RE | Admit: 2019-11-18 | Discharge: 2019-11-18 | Disposition: A | Discharge status: Home / Self Care | Payer: Medicare Other | Source: Ambulatory Visit | Attending: Hematology and Oncology | Admitting: Hematology and Oncology

## 2019-11-18 DIAGNOSIS — G40109 Localization-related (focal) (partial) symptomatic epilepsy and epileptic syndromes with simple partial seizures, not intractable, without status epilepticus: Secondary | ICD-10-CM | POA: Diagnosis not present

## 2019-11-18 DIAGNOSIS — E559 Vitamin D deficiency, unspecified: Secondary | ICD-10-CM | POA: Diagnosis not present

## 2019-11-18 DIAGNOSIS — C719 Malignant neoplasm of brain, unspecified: Secondary | ICD-10-CM | POA: Diagnosis not present

## 2019-11-18 DIAGNOSIS — C711 Malignant neoplasm of frontal lobe: Secondary | ICD-10-CM | POA: Diagnosis not present

## 2019-11-18 DIAGNOSIS — Z5111 Encounter for antineoplastic chemotherapy: Secondary | ICD-10-CM | POA: Diagnosis not present

## 2019-11-18 LAB — CBC WITH DIFFERENTIAL/PLATELET
Abs Immature Granulocytes: 0.02 10*3/uL (ref 0.00–0.07)
Basophils Absolute: 0 10*3/uL (ref 0.0–0.1)
Basophils Relative: 1 %
Eosinophils Absolute: 0 10*3/uL (ref 0.0–0.5)
Eosinophils Relative: 1 %
HCT: 37.3 % (ref 36.0–46.0)
Hemoglobin: 12.3 g/dL (ref 12.0–15.0)
Immature Granulocytes: 0 %
Lymphocytes Relative: 18 %
Lymphs Abs: 1 10*3/uL (ref 0.7–4.0)
MCH: 32.6 pg (ref 26.0–34.0)
MCHC: 33 g/dL (ref 30.0–36.0)
MCV: 98.9 fL (ref 80.0–100.0)
Monocytes Absolute: 0.3 10*3/uL (ref 0.1–1.0)
Monocytes Relative: 7 %
Neutro Abs: 3.9 10*3/uL (ref 1.7–7.7)
Neutrophils Relative %: 73 %
Platelets: 196 10*3/uL (ref 150–400)
RBC: 3.77 MIL/uL — ABNORMAL LOW (ref 3.87–5.11)
RDW: 11.6 % (ref 11.5–15.5)
WBC: 5.3 10*3/uL (ref 4.0–10.5)
nRBC: 0 % (ref 0.0–0.2)

## 2019-11-18 LAB — VITAMIN D 25 HYDROXY (VIT D DEFICIENCY, FRACTURES): Vit D, 25-Hydroxy: 18.32 ng/mL — ABNORMAL LOW (ref 30–100)

## 2019-11-18 LAB — COMPREHENSIVE METABOLIC PANEL
ALT: 13 U/L (ref 0–44)
AST: 14 U/L — ABNORMAL LOW (ref 15–41)
Albumin: 3.7 g/dL (ref 3.5–5.0)
Alkaline Phosphatase: 48 U/L (ref 38–126)
Anion gap: 9 (ref 5–15)
BUN: 13 mg/dL (ref 6–20)
CO2: 27 mmol/L (ref 22–32)
Calcium: 8.6 mg/dL — ABNORMAL LOW (ref 8.9–10.3)
Chloride: 109 mmol/L (ref 98–111)
Creatinine, Ser: 0.66 mg/dL (ref 0.44–1.00)
GFR calc Af Amer: >60 mL/min (ref >60)
GFR calc non Af Amer: >60 mL/min (ref >60)
Glucose, Bld: 97 mg/dL (ref 70–99)
Potassium: 4.1 mmol/L (ref 3.5–5.1)
Sodium: 145 mmol/L (ref 135–145)
Total Bilirubin: <0.2 mg/dL — ABNORMAL LOW (ref 0.3–1.2)
Total Protein: 6.7 g/dL (ref 6.5–8.1)

## 2019-11-18 LAB — VALPROIC ACID LEVEL: Valproic Acid Lvl: 23 ug/mL — ABNORMAL LOW (ref 50.0–100.0)

## 2019-11-18 MED ORDER — GADOBUTROL 1 MMOL/ML IV SOLN
7.0000 mL | Freq: Once | INTRAVENOUS | Status: AC | PRN
  Administered 2019-11-18: 7 mL via INTRAVENOUS

## 2019-11-18 NOTE — Progress Notes (Addendum)
Initiated on Lan[place], Grae[place] 11/18/2019 Benefit Verification  [id number] Last injection 08/30/2019  Waiting for reply  Covered per insurer guidelines must use [employer]. Sent fax to scan into her chart.

## 2019-11-21 ENCOUNTER — Encounter: Payer: Self-pay | Admitting: Hematology and Oncology

## 2019-11-21 ENCOUNTER — Telehealth: Payer: Self-pay | Admitting: Hematology and Oncology

## 2019-11-21 ENCOUNTER — Telehealth: Payer: Self-pay

## 2019-11-21 ENCOUNTER — Inpatient Hospital Stay (HOSPITAL_BASED_OUTPATIENT_CLINIC_OR_DEPARTMENT_OTHER): Payer: Medicare Other | Admitting: Hematology and Oncology

## 2019-11-21 DIAGNOSIS — R110 Nausea: Secondary | ICD-10-CM

## 2019-11-21 DIAGNOSIS — R4702 Dysphasia: Secondary | ICD-10-CM | POA: Diagnosis not present

## 2019-11-21 DIAGNOSIS — R202 Paresthesia of skin: Secondary | ICD-10-CM

## 2019-11-21 DIAGNOSIS — E559 Vitamin D deficiency, unspecified: Secondary | ICD-10-CM | POA: Diagnosis not present

## 2019-11-21 DIAGNOSIS — R519 Headache, unspecified: Secondary | ICD-10-CM

## 2019-11-21 DIAGNOSIS — G44021 Chronic cluster headache, intractable: Secondary | ICD-10-CM | POA: Diagnosis not present

## 2019-11-21 DIAGNOSIS — R0789 Other chest pain: Secondary | ICD-10-CM

## 2019-11-21 DIAGNOSIS — C711 Malignant neoplasm of frontal lobe: Secondary | ICD-10-CM

## 2019-11-21 MED ORDER — CARISOPRODOL 350 MG PO TABS: 350.0000 mg | ORAL_TABLET | Freq: Three times a day (TID) | ORAL | 1 refills | Status: DC

## 2019-11-21 MED ORDER — ERGOCALCIFEROL 1.25 MG (50000 UT) PO CAPS: 50000.0000 [IU] | ORAL_CAPSULE | ORAL | 3 refills | Status: DC

## 2019-11-21 MED ORDER — BUTALBITAL-APAP-CAFFEINE 50-325-40 MG PO TABS: 1.0000 | ORAL_TABLET | Freq: Three times a day (TID) | ORAL | 0 refills | Status: DC | PRN

## 2019-11-21 MED FILL — VIT D2 1.25 MG (50,000 UNIT: 1.25 MG | 90 days supply | Qty: 12 | Fill #0

## 2019-11-21 NOTE — Telephone Encounter (Signed)
Called with below message. Office will have Dr. Salomon Fick review and then call [place].  Called Radiology, spoke with Tedra Coupe. She will powershare MRI with [facility].

## 2019-11-21 NOTE — Assessment & Plan Note (Signed)
She ran out of her prescription approximately a month ago I refill her prescription vitamin D and plan to recheck again in a few months

## 2019-11-21 NOTE — Telephone Encounter (Signed)
-----   Message from Heath Lark, MD sent at 11/21/2019  2:25 PM EDT ----- Regarding: Neurosurgeon Dr. Salomon Fick I routed my notes to Dr. Salomon Fick She is requesting him to review her MRI because she does not think it is "right" She wants him to call her/set up visit Can you call his office?

## 2019-11-21 NOTE — Progress Notes (Signed)
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 Salomon Fick at [facility] for further evaluation I will get my nurse to send him the records  Intractable chronic cluster headache She has multifactorial headaches Previously, we discussed medication taper I will proceed to refill Fioricet at every 8 hours as needed today Her prescription of hydrocodone is at every 8 hours as needed as well I plan to keep those medications at the dose above and will taper her medications again in around July  Vitamin D deficiency She ran out of her prescription approximately a month ago I refill her prescription vitamin D and plan to recheck again in a few months   No orders of the defined types were placed in this encounter.   INTERVAL HISTORY: Please see below for problem oriented charting. 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   09/27/2013 Pathology Results   Accession: [id number] pathology showed grade 2 oligodendroglioma with mutant IDH1 protein, ATRX, OLIG 2 expression. p53 is negative, low Ki-67 labeling index. Molecular SNP array studies demonstrate whole arm 1p19q codeletion."   10/28/2013 Imaging   Ct scan of head showed possible abscess.   11/06/2013 Imaging   Repeat CT scan of the head showed resolution of abscess.   01/23/2014 Surgery   Dr. Christella Noa perform a subtotal gross resection of the tumor.   01/24/2014 Imaging   Repeat imaging studies showed persistent and residual disease.   03/03/2014 - 09/01/2014 Chemotherapy   She was started on Temodar, 5 days every 28 days   03/13/2014 Imaging   Repeat MRI showed no evidence of progression of disease.   07/14/2014 Imaging   Repeat MRI showed no evidence of disease progression.   09/12/2014 Imaging   MRI of the liver show mild regression of the size of lesions, presumed to be benign   09/15/2014 Imaging   Repeat MRI of the brain show persistent abnormalities, suspicious for residual disease   03/02/2015 Imaging    MRI brain at [place] show persistent abnormalities   08/10/2015 Imaging   Repeat MRI [facility] showed stable disease   08/24/2015 - 02/08/2016 Chemotherapy   She was started on Lomustine, Procarbazine and Vincristine chemotherapy   08/30/2015 Imaging   CT chest showed no evidence  of aspiration pneumonia   11/18/2015 Imaging   MRI brain showed status post resection of right frontal lobe tumor without residual or recurrent tumor. 2. Otherwise negative MRI of the brain.    03/02/2016 Imaging   Stable MRI post right frontal lobe  tumor resection. Negative for recurrent tumor   06/01/2016 Imaging   Stable MRI post right frontal lobe tumor resection. No recurrent tumor identified   08/19/2016 Imaging   Unchanged examination without tumor recurrence.   11/17/2016 Imaging   Stable right frontal resection cavity. Stable focal mass effect within the right middle frontal gyrus at the superior margin of the cavity which may represent nonenhancing neoplasm. No evidence for tumor progression. Otherwise stable MRI of the brain   12/16/2016 Imaging   MR brain: 1. No acute intracranial abnormality. 2. Unchanged appearance of right frontal lobe resection cavity and surrounding hyperintense T2 weighted signal.   02/04/2017 Imaging   Repeat MRI Normal pituitary  Stable postop changes of right frontal tumor resection   05/12/2017 Imaging   The left ventricular size is normal. There is normal left ventricular wall thickness.  Left ventricular systolic function is normal. LV ejection fraction = 60-65%.  Left ventricular filling pattern is normal. The left ventricular wall motion is normal. The right ventricle is normal in size and function. There is no significant valvular stenosis or regurgitation No pulmonary hypertension. IVC size was normal. There is no pericardial effusion. There is no comparison study available   05/13/2017 Imaging   1.No evidence of acute pulmonary thromboembolic disease. 2.There is an 8 mm right lower lobe groundglass nodule. Statistically, in this age group, this is most likely to reflect benign etiologies such as a tiny focus of infection or nonspecific inflammation. 4 mm solid right upper lobe lung nodule. A CT scan could be obtained in 12 months to further evaluate if the patient has any known risk factors for malignancy. 3.There is a 1.3 cm left adrenal nodule. This finding is incompletely characterized although and is slightly increased in size relative to the abdominal MRI from February  2016. Although this finding is most likely benign, recommend follow-up adrenal protocol CT in one year.   05/13/2017 Imaging   MRI brain 1.No acute intracranial abnormality. 2.Postsurgical changes of a right frontal craniotomy for resection of a reported oligodendroglioma. Hyperintense T2/FLAIR signal around the resection cavity and in the subcortical white matter of a gyrus along the superior margin of the cavity is unchanged from 01/05/2015. No evidence of disease progression.   12/15/2017 Imaging   MRI brain at [facility] 1.No acute intracranial abnormality. 2.Postsurgical changes of right frontal craniotomy for resection of reported oligodendroglioma. No evidence of disease progression   12/28/2017 Procedure   Successful right IJ vein Port-A-Cath explant.   04/24/2018 Imaging   Stable post treatment appearance, RIGHT frontal oligodendroglioma.  No posttraumatic sequelae are evident.   01/30/2019 Imaging   Stable appearance of treated right frontal oligodendroglioma. No change from 04/24/2018.   11/18/2019 Imaging   1. Stable postoperative changes of the right frontal lobe without evidence for residual or recurrent disease. 2. No acute intracranial abnormality or significant interval change.     REVIEW OF SYSTEMS:   Constitutional: Denies fevers, chills or abnormal weight loss Eyes: Denies blurriness of vision Ears, nose, mouth, throat, and face: Denies mucositis or sore throat Respiratory: Denies cough, dyspnea or wheezes Cardiovascular: Denies palpitation, chest discomfort Gastrointestinal:  Denies nausea, heartburn or change in bowel habits Skin: Denies abnormal skin rashes Lymphatics: Denies  new lymphadenopathy or easy bruising Neurological:Denies numbness, tingling or new weaknesses Behavioral/Psych: Mood is stable, no new changes  Extremities: No lower extremity edema All other systems were reviewed with the patient and are negative.  I have reviewed the past  medical history, past surgical history, social history and family history with the patient and they are unchanged from previous note.  ALLERGIES:  is allergic to adhesive [tape]; latex; medical adhesive remover; percocet [oxycodone-acetaminophen]; prednisone; shellfish allergy; soma [carisoprodol]; tramadol hcl; vancomycin; and zofran [ondansetron hcl].  MEDICATIONS:  Current Outpatient Medications  Medication Sig Dispense Refill  . butalbital-acetaminophen-caffeine (FIORICET) 50-325-40 MG tablet Take 1 tablet by mouth every 8 (eight) hours as needed for headache. 90 tablet 0  . carisoprodol (SOMA) 350 MG tablet Take 1 tablet (350 mg total) by mouth 3 (three) times daily. 270 tablet 1  . Cyanocobalamin (VITAMIN B-12 PO) Take 5,000 Units daily by mouth.     . divalproex (DEPAKOTE ER) 250 MG 24 hr tablet Take 2 tabs in AM, 3 tabs in PM 450 tablet 3  . eletriptan (RELPAX) 20 MG tablet Take 1 tablet as needed for migraine. Do not take more than 2-3 a week 10 tablet 5  . EPINEPHRINE 0.3 mg/0.3 mL IJ SOAJ injection INJECT 0.3 MLS (ONE SYRINGE) INTO THE MUSCLE ONCE FOR 1 DOSE AS NEEDED. (Patient taking differently: Inject 0.3 mg into the muscle once. ) 0.3 mL 0  . ergocalciferol (VITAMIN D2) 1.25 MG (50000 UT) capsule Take 1 capsule (50,000 Units total) by mouth once a week. 12 capsule 3  . gabapentin (NEURONTIN) 300 MG capsule Take 4 capsules twice a day 720 capsule 3  . HYDROcodone-acetaminophen (NORCO) 10-325 MG tablet Take 1 tablet by mouth 3 (three) times daily as needed. 90 tablet 0  . levocetirizine (XYZAL) 5 MG tablet TAKE 1 TABLET (5 MG TOTAL) BY MOUTH EVERY EVENING. 30 tablet 5  . LORazepam (ATIVAN) 0.5 MG tablet Take 1 tablet (0.5 mg total) by mouth every 8 (eight) hours as needed for anxiety or seizure. 90 tablet 0  . montelukast (SINGULAIR) 10 MG tablet Take 1 tablet (10 mg total) by mouth at bedtime. 30 tablet 9  . naproxen (NAPROSYN) 250 MG tablet Take by mouth 3 (three) times daily with  meals.    . Rimegepant Sulfate (NURTEC) 75 MG TBDP Take 1 tablet by mouth as needed (Take 1 tablet as needed for migraine. Do not take more than 2-3 times a week). 10 tablet 11  . zolmitriptan (ZOMIG) 5 MG tablet Take 1 tablet as needed at onset of migraine. Do not take more than 2-3 a week 10 tablet 11   No current facility-administered medications for this visit.    PHYSICAL EXAMINATION: ECOG PERFORMANCE STATUS: 1 - Symptomatic but completely ambulatory  LABORATORY DATA:  I have reviewed the data as listed CMP Latest Ref Rng & Units 11/18/2019 08/07/2019 03/26/2018  Glucose 70 - 99 mg/dL 97 102(H) 92  BUN 6 - 20 mg/dL '13 12 11  ' Creatinine 0.44 - 1.00 mg/dL 0.66 0.62 0.55  Sodium 135 - 145 mmol/L 145 140 140  Potassium 3.5 - 5.1 mmol/L 4.1 4.8 3.6  Chloride 98 - 111 mmol/L 109 102 105  CO2 22 - 32 mmol/L '27 28 26  ' Calcium 8.9 - 10.3 mg/dL 8.6(L) 9.0 9.1  Total Protein 6.5 - 8.1 g/dL 6.7 - -  Total Bilirubin 0.3 - 1.2 mg/dL <0.2(L) - -  Alkaline Phos 38 - 126 U/L 48 - -  AST 15 -  41 U/L 14(L) - -  ALT 0 - 44 U/L 13 - -    Lab Results  Component Value Date   WBC 5.3 11/18/2019   HGB 12.3 11/18/2019   HCT 37.3 11/18/2019   MCV 98.9 11/18/2019   PLT 196 11/18/2019   NEUTROABS 3.9 11/18/2019     RADIOGRAPHIC STUDIES: I have personally reviewed the radiological images as listed and agreed with the findings in the report. MR BRAIN W WO CONTRAST  Result Date: 11/19/2019 CLINICAL DATA:  Oligodendroglioma. Status post surgery chemotherapy. EXAM: MRI HEAD WITHOUT AND WITH CONTRAST TECHNIQUE: Multiplanar, multiecho pulse sequences of the brain and surrounding structures were obtained without and with intravenous contrast. CONTRAST:  80m GADAVIST GADOBUTROL 1 MMOL/ML IV SOLN COMPARISON:  MR head without contrast 01/30/2019 and 04/24/2018. FINDINGS: Brain: Right frontal craniotomy encephalomalacia associated with resection of previous tumor is again noted. Faint enhancement at the operative  site appears to be mostly vascular. No new or enlarging enhancement is present. No new or enlarging white matter signal changes are present. Ex vacuo dilation of the right lateral ventricle associated with volume loss is stable. No new enhancement or mass lesion present. No acute infarct or hemorrhage is present. The ventricles are of normal size. No significant extraaxial fluid collection is present. The internal auditory canals are within normal limits. The brainstem and cerebellum are within normal limits. Vascular: Flow is present in the major intracranial arteries. Skull and upper cervical spine: The craniocervical junction is normal. Upper cervical spine is within normal limits. Marrow signal is unremarkable. Sinuses/Orbits: The paranasal sinuses and mastoid air cells are clear. The globes and orbits are within normal limits. IMPRESSION: 1. Stable postoperative changes of the right frontal lobe without evidence for residual or recurrent disease. 2. No acute intracranial abnormality or significant interval change. Electronically Signed   By: CSan MorelleM.D.   On: 11/19/2019 09:10    I discussed the assessment and treatment plan with the patient. The patient was provided an opportunity to ask questions and all were answered. The patient agreed with the plan and demonstrated an understanding of the instructions. The patient was advised to call back or seek an in-person evaluation if the symptoms worsen or if the condition fails to improve as anticipated.    I spent 20 minutes for the appointment reviewing test results, discuss management and coordination of care.  NHeath Lark MD 11/21/2019 2:24 PM

## 2019-11-21 NOTE — Assessment & Plan Note (Signed)
She has multifactorial headaches Previously, we discussed medication taper I will proceed to refill Fioricet at every 8 hours as needed today Her prescription of hydrocodone is at every 8 hours as needed as well I plan to keep those medications at the dose above and will taper her medications again in around July

## 2019-11-21 NOTE — Assessment & Plan Note (Addendum)
I have reviewed the MRI imaging with the patient's Overall, it shows no evidence of progression She is concerned of the description of the MRI because she is having severe headache and frequent seizures I recommend we consult her neurosurgeon again She requested her records to be sent to neurosurgeon, Dr. Salomon Fick at [facility] for further evaluation I will get my nurse to send him the records

## 2019-11-21 NOTE — Telephone Encounter (Signed)
Scheduled appt per 5/6 sch msg. Left voicemail with appt date and time.

## 2019-11-29 ENCOUNTER — Other Ambulatory Visit: Payer: Self-pay

## 2019-11-29 ENCOUNTER — Ambulatory Visit (INDEPENDENT_AMBULATORY_CARE_PROVIDER_SITE_OTHER): Payer: Medicare Other | Admitting: Neurology

## 2019-11-29 DIAGNOSIS — G43709 Chronic migraine without aura, not intractable, without status migrainosus: Secondary | ICD-10-CM | POA: Diagnosis not present

## 2019-11-29 DIAGNOSIS — IMO0002 Reserved for concepts with insufficient information to code with codable children: Secondary | ICD-10-CM

## 2019-11-29 MED ORDER — ONABOTULINUMTOXINA 100 UNITS IJ SOLR
200.0000 [IU] | Freq: Once | INTRAMUSCULAR | Status: AC
Start: 2019-11-29 — End: 2019-11-29
  Administered 2019-11-29: 155 [IU] via INTRAMUSCULAR

## 2019-11-29 NOTE — Progress Notes (Signed)
[facility]   Procedure Note Botox  Attending: Dr. Ivelise Castillo  Preoperative Diagnosis(es): Chronic migraine  Consent obtained from: The patient Benefits discussed included, but were not limited to decreased muscle tightness, increased joint range of motion, and decreased pain.  Risk discussed included, but were not limited pain and discomfort, bleeding, bruising, excessive weakness, venous thrombosis, muscle atrophy and dysphagia.  Anticipated outcomes of the procedure as well as he risks and benefits of the alternatives to the procedure, and the roles and tasks of the personnel to be involved, were discussed with the patient, and the patient consents to the procedure and agrees to proceed. A copy of the patient medication guide was given to the patient which explains the blackbox warning.  Patients identity and treatment sites confirmed Yes.  .  Details of Procedure: Skin was cleaned with alcohol. Prior to injection, the needle plunger was aspirated to make sure the needle was not within a blood vessel.  There was no blood retrieved on aspiration.    Following is a summary of the muscles injected  And the amount of Botulinum toxin used:  Dilution 200 units of Botox was reconstituted with 4 ml of preservative free normal saline. Time of reconstitution: At the time of the office visit (<30 minutes prior to injection)   Injections  155 total units of Botox was injected with a 30 gauge needle.  Injection Sites: L occipitalis: 15 units- 3 sites  R occiptalis: 15 units- 3 sites  L upper trapezius: 15 units- 3 sites R upper trapezius: 15 units- 3 sits          L paraspinal: 10 units- 2 sites R paraspinal: 10 units- 2 sites  Face L frontalis(2 injection sites):10 units   R frontalis(2 injection sites):10 units         L corrugator: 5 units   R corrugator: 5 units           Procerus: 5 units   L temporalis: 20 units R temporalis: 20 units   Agent:  200 units of botulinum Type  A (Onobotulinum Toxin type A) was reconstituted with 4 ml of preservative free normal saline.  Time of reconstitution: At the time of the office visit (<30 minutes prior to injection)     Total injected (Units): 155  Total wasted (Units): 8  Patient tolerated procedure well without complications.   Reinjection is anticipated in 3 months.    

## 2019-12-02 DIAGNOSIS — C711 Malignant neoplasm of frontal lobe: Secondary | ICD-10-CM | POA: Diagnosis not present

## 2019-12-02 DIAGNOSIS — C719 Malignant neoplasm of brain, unspecified: Secondary | ICD-10-CM | POA: Diagnosis not present

## 2019-12-06 ENCOUNTER — Encounter: Payer: Self-pay | Admitting: Hematology and Oncology

## 2019-12-06 ENCOUNTER — Telehealth: Payer: Self-pay

## 2019-12-06 ENCOUNTER — Other Ambulatory Visit: Payer: Self-pay | Admitting: Hematology and Oncology

## 2019-12-06 DIAGNOSIS — C711 Malignant neoplasm of frontal lobe: Secondary | ICD-10-CM

## 2019-12-06 DIAGNOSIS — R519 Headache, unspecified: Secondary | ICD-10-CM

## 2019-12-06 DIAGNOSIS — R0789 Other chest pain: Secondary | ICD-10-CM

## 2019-12-06 DIAGNOSIS — R202 Paresthesia of skin: Secondary | ICD-10-CM

## 2019-12-06 DIAGNOSIS — R4702 Dysphasia: Secondary | ICD-10-CM

## 2019-12-06 DIAGNOSIS — R110 Nausea: Secondary | ICD-10-CM

## 2019-12-06 MED ORDER — CARISOPRODOL 350 MG PO TABS: 350.0000 mg | ORAL_TABLET | Freq: Four times a day (QID) | ORAL | 1 refills | Status: DC

## 2019-12-06 MED FILL — CARISOPRODOL 350 MG TABS: 350 | 90 days supply | Qty: 270 | Fill #0

## 2019-12-06 NOTE — Telephone Encounter (Signed)
Called regarding below Victoria Bates. Given below mychart message from Dr. Alvy Bimler. [name] has not reduced the Soma Rx to 3 x a day and would prefer to keep it at 4 x day. Dr. Alvy Bimler notified and sent new Rx for Soma 4 x day. [name] is waiting on Neurology referral at [facility]. [name] will call back with update when she gets the referral to neurology.

## 2019-12-10 ENCOUNTER — Encounter: Payer: Self-pay | Admitting: Hematology and Oncology

## 2019-12-10 ENCOUNTER — Other Ambulatory Visit: Payer: Self-pay | Admitting: Hematology and Oncology

## 2019-12-10 DIAGNOSIS — C719 Malignant neoplasm of brain, unspecified: Secondary | ICD-10-CM

## 2019-12-10 MED ORDER — HYDROCODONE-ACETAMINOPHEN 10-325 MG PO TABS: 1.0000 | ORAL_TABLET | Freq: Three times a day (TID) | ORAL | 0 refills | Status: DC | PRN

## 2019-12-10 MED FILL — HYDROCODON-APAP 10-325: 10-325 | 30 days supply | Qty: 90 | Fill #0

## 2019-12-13 ENCOUNTER — Encounter: Payer: Self-pay | Admitting: Hematology and Oncology

## 2019-12-24 ENCOUNTER — Other Ambulatory Visit: Payer: Self-pay | Admitting: Hematology and Oncology

## 2019-12-26 DIAGNOSIS — D330 Benign neoplasm of brain, supratentorial: Secondary | ICD-10-CM | POA: Diagnosis not present

## 2019-12-26 MED ORDER — BUTALBITAL-APAP-CAFFEINE 50-325-40 MG PO TABS: 1.0000 | ORAL_TABLET | Freq: Three times a day (TID) | ORAL | 0 refills | Status: DC | PRN

## 2019-12-26 MED ORDER — LORAZEPAM 0.5 MG PO TABS: 0.5000 mg | ORAL_TABLET | Freq: Three times a day (TID) | ORAL | 0 refills | Status: DC | PRN

## 2019-12-31 DIAGNOSIS — Z23 Encounter for immunization: Secondary | ICD-10-CM | POA: Diagnosis not present

## 2020-01-06 ENCOUNTER — Other Ambulatory Visit: Payer: Self-pay | Admitting: Hematology and Oncology

## 2020-01-06 DIAGNOSIS — C719 Malignant neoplasm of brain, unspecified: Secondary | ICD-10-CM

## 2020-01-06 MED ORDER — HYDROCODONE-ACETAMINOPHEN 10-325 MG PO TABS: 1.0000 | ORAL_TABLET | Freq: Three times a day (TID) | ORAL | 0 refills | Status: DC | PRN

## 2020-01-07 MED FILL — HYDROCODON-APAP 10-325: 10-325 | 30 days supply | Qty: 90 | Fill #0

## 2020-01-14 ENCOUNTER — Other Ambulatory Visit (HOSPITAL_COMMUNITY): Payer: Self-pay | Admitting: Dermatology

## 2020-01-15 MED FILL — DIVALPROEX SOD ER 500 MG TA: 500 | 30 days supply | Qty: 120 | Fill #0

## 2020-01-17 ENCOUNTER — Other Ambulatory Visit: Payer: Self-pay | Admitting: Neurology

## 2020-01-17 ENCOUNTER — Other Ambulatory Visit: Payer: Self-pay

## 2020-01-17 DIAGNOSIS — G40109 Localization-related (focal) (partial) symptomatic epilepsy and epileptic syndromes with simple partial seizures, not intractable, without status epilepticus: Secondary | ICD-10-CM

## 2020-01-17 MED ORDER — GABAPENTIN 300 MG PO CAPS: ORAL_CAPSULE | ORAL | 0 refills | Status: DC

## 2020-01-17 MED FILL — GABAPENTIN 300 MG CAPSULE: 300 | 30 days supply | Qty: 210 | Fill #0

## 2020-01-17 NOTE — Telephone Encounter (Signed)
Patient called in and needs a refill of her gabapentin sent to [facility]

## 2020-01-21 ENCOUNTER — Other Ambulatory Visit: Payer: Self-pay | Admitting: Hematology and Oncology

## 2020-01-22 ENCOUNTER — Other Ambulatory Visit: Payer: Self-pay | Admitting: Hematology and Oncology

## 2020-01-23 MED ORDER — BUTALBITAL-APAP-CAFFEINE 50-325-40 MG PO TABS: 1.0000 | ORAL_TABLET | Freq: Three times a day (TID) | ORAL | 0 refills | Status: DC | PRN

## 2020-01-23 MED ORDER — LORAZEPAM 0.5 MG PO TABS: 0.5000 mg | ORAL_TABLET | Freq: Three times a day (TID) | ORAL | 0 refills | Status: DC | PRN

## 2020-01-23 MED FILL — LORazepam 0.5 MG TABS: 0.5 | 30 days supply | Qty: 90 | Fill #0

## 2020-01-23 MED FILL — BUTALB-ACETAMIN-CAFF 50-325: 50-325-40 | 30 days supply | Qty: 90 | Fill #0

## 2020-01-24 ENCOUNTER — Other Ambulatory Visit: Payer: Self-pay

## 2020-01-24 ENCOUNTER — Encounter: Payer: Self-pay | Admitting: Emergency Medicine

## 2020-01-24 ENCOUNTER — Ambulatory Visit
Admission: EM | Admit: 2020-01-24 | Discharge: 2020-01-24 | Disposition: A | Discharge status: Home / Self Care | Payer: Medicare Other | Attending: Physician Assistant | Admitting: Physician Assistant

## 2020-01-24 ENCOUNTER — Ambulatory Visit (INDEPENDENT_AMBULATORY_CARE_PROVIDER_SITE_OTHER): Payer: Medicare Other

## 2020-01-24 DIAGNOSIS — M79672 Pain in left foot: Secondary | ICD-10-CM

## 2020-01-24 DIAGNOSIS — W51XXXA Accidental striking against or bumped into by another person, initial encounter: Secondary | ICD-10-CM | POA: Diagnosis not present

## 2020-01-24 DIAGNOSIS — S9032XA Contusion of left foot, initial encounter: Secondary | ICD-10-CM | POA: Diagnosis not present

## 2020-01-24 MED ORDER — DICLOFENAC SODIUM 1 % EX GEL: 2.0000 g | Freq: Four times a day (QID) | CUTANEOUS | 0 refills | Status: DC

## 2020-01-24 NOTE — ED Provider Notes (Signed)
[facility]    CSN: [id number] Arrival date & time: 01/24/20  1634      History   Chief Complaint Chief Complaint  Patient presents with  . Foot Pain    HPI Victoria Bates is a 39 y.o. female.   39 year old female comes in for left foot pain. States 1 week ago, her 280lb son jumped off a step and landed on her foot. Pain to the distal MTPs with swelling/contusion. States since injury, had numbness to the 2nd toe that has not improved. States swelling was slightly improving, however, slipped yesterday, and now with new contusion to distal foot. Painful weightbearing. Ibuprofen/tylenol.      Past Medical History:  Diagnosis Date  . Adrenal tumor   . Anemia   . Anxiety   . Asthma    very mild- no inhaler use since July 2013  . Brain tumor ([place])    Hx: of  . Cancer ([place]) 2003   skin  . Chronic fatigue 10/03/2014  . Cognitive impairment 08/13/2015  . Complication of anesthesia    takes longer for patient to go under anesthesia  . Dizziness   . Dysphasia 02/12/2014  . 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 Headache 02/12/2014  . Headache(784.0)    migraines  . Herniated disc   . Hot flashes 10/03/2014  . Infection    to right head incision  . 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 Liver hemangioma   . Liver lesion 08/22/2014  . Nausea alone 02/12/2014  . Neuropathic pain 04/08/2014  . Petit mal seizure status ([place])   . Seizures ([place])    petit mal serizures most days    Patient Active Problem List   Diagnosis Date Noted  . Dysuria 08/03/2018  . Elevated BP without diagnosis of hypertension 08/03/2018  . Chronic leukopenia 08/03/2018  . Acute pain 04/18/2018  . Polypharmacy 11/14/2017  . Easy bruising 11/14/2017  . Sinus infection 11/14/2017  . Pulmonary nodules 08/18/2017  . Vitamin D deficiency 07/19/2017  . Vitamin B12 deficiency 07/19/2017  . Cough, persistent 06/20/2017  . Orthostatic dizziness 01/24/2017  . Addison disease ([place]) 01/16/2017  .  Anxiety 01/16/2017  . 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 Goals of care, counseling/discussion 11/20/2016  . Pancytopenia, acquired ([place]) 10/10/2016  . Deficiency anemia 10/10/2016  . Upper airway cough syndrome 01/28/2016  . Cough variant asthma 01/28/2016  . Dyspnea 01/28/2016  . 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 Constipation, acute 10/12/2015  . Cognitive impairment 08/13/2015  . Dysphagia, idiopathic 04/30/2015  . Low serum cortisol level ([place]) 10/20/2014  . 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 Chronic fatigue 10/03/2014  . Hot flashes 10/03/2014  . Liver lesion 08/22/2014  . Pancytopenia due to antineoplastic chemotherapy ([place]) 06/23/2014  . Genetic testing 06/13/2014  . Intractable chronic cluster headache 04/28/2014  . Thrombocytopenia ([place]) 04/28/2014  . Neuropathic pain 04/08/2014  . Simple partial seizures ([place]) 03/20/2014  . Sensory seizure ([place]) 03/20/2014  . Dysphasia 02/12/2014  . Headache 02/12/2014  . Seizures ([place]) 02/12/2014  . Financial difficulty 02/12/2014  . CIN I (cervical intraepithelial neoplasia I) 02/12/2014  . Anemia, unspecified 02/12/2014  . 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 Paresthesias 09/20/2013  . Blurred vision, left eye  09/20/2013  . Asthma 07/24/2009  . CHEST PAIN 07/24/2009    Past Surgical History:  Procedure Laterality Date  . ABDOMINAL HYSTERECTOMY    . CESAREAN SECTION    . COLONOSCOPY WITH PROPOFOL N/A 08/29/2012   Procedure: COLONOSCOPY WITH PROPOFOL;  Surgeon: Arta Silence, MD;  Location: [facility];  Service: Endoscopy;  Laterality: N/A;  . coposcopy  08/06/2012   cervical biopsy  . CRANIOTOMY Right  09/26/2013   Procedure: Awake Right frontal open brain biopsy;  Surgeon: Winfield Cunas, MD;  Location: [facility];  Service: Neurosurgery;  Laterality: Right;  awake Right frontal open brain biopsy  . CRANIOTOMY N/A 10/30/2013   Procedure: debridement of cranial wound  right temporal incision, ;  Surgeon: Winfield Cunas, MD;  Location: [facility];  Service: Neurosurgery;  Laterality: N/A;  . CRANIOTOMY N/A 01/23/2014   Procedure: Awake Craniotomy with BrainLab;  Surgeon: Winfield Cunas, MD;  Location: [facility] [facility];  Service: Neurosurgery;  Laterality: N/A;  Awake Craniotomy  tumor with BrainLab  . ESOPHAGOGASTRODUODENOSCOPY (EGD) WITH PROPOFOL N/A 08/29/2012   Procedure: ESOPHAGOGASTRODUODENOSCOPY (EGD) WITH PROPOFOL;  Surgeon: Arta Silence, MD;  Location: [facility];  Service: Endoscopy;  Laterality: N/A;  . IR RADIOLOGY PERIPHERAL GUIDED IV START  12/28/2017  . IR REMOVAL TUN ACCESS W/ PORT W/O FL MOD SED  12/28/2017  . IR US GUIDE VASC ACCESS LEFT  12/28/2017  . left knee surgery  2000   torn meniscus  . right flank surgery  2005   bnign mass from flank area-right  . ROBOTIC ASSISTED TOTAL HYSTERECTOMY WITH BILATERAL SALPINGO OOPHERECTOMY Bilateral 09/25/2012   Procedure: ROBOTIC ASSISTED TOTAL HYSTERECTOMY WITH BILATERAL SALPINGO OOPHORECTOMY;  Surgeon: Maeola Sarah. Landry Mellow, MD;  Location: [facility];  Service: Gynecology;  Laterality: Bilateral;  . ROTATOR CUFF REPAIR    . WISDOM TOOTH EXTRACTION      OB History   No obstetric history on file.      Home Medications    Prior to Admission medications   Medication Sig Start Date End Date Taking? Authorizing Provider  butalbital-acetaminophen-caffeine (FIORICET) 50-325-40 MG tablet Take 1 tablet by mouth every 8 (eight) hours as needed for headache. 01/23/20   Heath Lark, MD  carisoprodol (SOMA) 350 MG tablet Take 1 tablet (350 mg total) by mouth 4 (four) times daily. 12/06/19   Heath Lark, MD  Cyanocobalamin (VITAMIN B-12 PO) Take 5,000 Units daily by  mouth.     [provider]  diclofenac Sodium (VOLTAREN) 1 % GEL Apply 2 g topically 4 (four) times daily. 01/24/20   Tasia Catchings, Chantz Montefusco V, PA-C  divalproex (DEPAKOTE ER) 250 MG 24 hr tablet Take 2 tabs in AM, 3 tabs in PM Patient taking differently: 1,000 mg in the morning and at bedtime. Take 1044m BID 02/22/19   ACameron Sprang MD  eletriptan (RELPAX) 20 MG tablet Take 1 tablet as needed for migraine. Do not take more than 2-3 a week 02/22/19   ACameron Sprang MD  EPINEPHRINE 0.3 mg/0.3 mL IJ SOAJ injection INJECT 0.3 MLS (ONE SYRINGE) INTO THE MUSCLE ONCE FOR 1 DOSE AS NEEDED. Patient taking differently: Inject 0.3 mg into the muscle once.  03/23/18   Kozlow, EDonnamarie Poag MD  ergocalciferol (VITAMIN D2) 1.25 MG (50000 UT) capsule Take 1 capsule (50,000 Units total) by mouth once a week. 11/21/19   GHeath Lark MD  gabapentin (NEURONTIN) 300 MG capsule TAKE 4 CAPSULES IN AM/3 CAPSULES IN PM 01/17/20   ACameron Sprang MD  HYDROcodone-acetaminophen (NORCO) 10-325 MG tablet Take 1 tablet by mouth 3 (three) times daily as needed. 01/06/20   Heath Lark, MD  levocetirizine (XYZAL) 5 MG tablet TAKE 1 TABLET (5 MG TOTAL) BY MOUTH EVERY EVENING. 11/04/19   Heath Lark, MD  LORazepam (ATIVAN) 0.5 MG tablet Take 1 tablet (0.5 mg total) by mouth every 8 (eight) hours as needed for anxiety or seizure. 01/23/20   Heath Lark, MD  montelukast (SINGULAIR) 10 MG tablet Take 1 tablet (10 mg total) by mouth at bedtime. 10/08/18   Heath Lark, MD  naproxen (NAPROSYN) 250 MG tablet Take by mouth 3 (three) times daily with meals.    [provider]  Rimegepant Sulfate (NURTEC) 75 MG TBDP Take 1 tablet by mouth as needed (Take 1 tablet as needed for migraine. Do not take more than 2-3 times a week). 06/26/19   Cameron Sprang, MD  zolmitriptan (ZOMIG) 5 MG tablet Take 1 tablet as needed at onset of migraine. Do not take more than 2-3 a week 08/30/19   Cameron Sprang, MD    Family History Family History  Problem Relation Age of  Onset  . Hypertension Mother   . Skin cancer Mother   . Thyroid cancer Father   . Skin cancer Father   . Breast cancer Maternal Aunt        dx in her 42s  . Prostate cancer Maternal Uncle 63  . Melanoma Maternal Uncle   . Ovarian cancer Maternal Grandmother        dx in her late 52s-40s  . Breast cancer Maternal Aunt        bilateral breast cancer dx in 60s and 30s; possibly BRCA+  . Other Son        multiple CAL spots, bright spots on brain MRI, ? chiari malformation    Social History Social History   Tobacco Use  . Smoking status: Never Smoker  . Smokeless tobacco: Never Used  Vaping Use  . Vaping Use: Never used  Substance Use Topics  . Alcohol use: No    Alcohol/week: 0.0 standard drinks  . Drug use: No     Allergies   Adhesive [tape], Latex, Medical adhesive remover, Percocet [oxycodone-acetaminophen], Prednisone, Shellfish allergy, Soma [carisoprodol], Tramadol hcl, Vancomycin, and Zofran [ondansetron hcl]   Review of Systems Review of Systems  Reason unable to perform ROS: See HPI as above.     Physical Exam Triage Vital Signs ED Triage Vitals  Enc Vitals Group     BP 01/24/20 1640 (!) 136/52     Pulse Rate 01/24/20 1640 93     Resp 01/24/20 1640 16     Temp 01/24/20 1640 98.4 F (36.9 C)     Temp Source 01/24/20 1640 Oral     SpO2 01/24/20 1640 98 %     Weight --      Height --      Head Circumference --      Peak Flow --      Pain Score 01/24/20 1646 3     Pain Loc --      Pain Edu? --      Excl. in [place]? --    No data found.  Updated Vital Signs BP (!) 136/52 (BP Location: Left Arm)   Pulse 93   Temp 98.4 F (36.9 C) (Oral)   Resp 16   LMP 09/20/2012   SpO2 98%   Physical Exam Constitutional:      General: She is  not in acute distress.    Appearance: Normal appearance. She is well-developed. She is not toxic-appearing or diaphoretic.  HENT:     Head: Normocephalic and atraumatic.  Eyes:     Conjunctiva/sclera: Conjunctivae  normal.     Pupils: Pupils are equal, round, and reactive to light.  Pulmonary:     Effort: Pulmonary effort is normal. No respiratory distress.     Comments: Speaking in full sentences without difficulty Musculoskeletal:     Cervical back: Normal range of motion and neck supple.     Comments: Contusion to the left distal foot. No significant swelling. No erythema, warmth. Tenderness to palpation of distal 3rd/4th MTP. Full ROM of ankle. Decreased flexion of toes. 0-2/5 of 2nd toe. Pedal pulse 2+, cap refill <2s  Skin:    General: Skin is warm and dry.  Neurological:     Mental Status: She is alert and oriented to person, place, and time.      UC Treatments / Results  Labs (all labs ordered are listed, but only abnormal results are displayed) Labs Reviewed - No data to display  EKG   Radiology DG Foot Complete Left  Result Date: 01/24/2020 CLINICAL DATA:  Injury with swelling and bruising EXAM: LEFT FOOT - COMPLETE 3+ VIEW COMPARISON:  None. FINDINGS: There is no evidence of fracture or dislocation. There is no evidence of arthropathy or other focal bone abnormality. Soft tissues are unremarkable. IMPRESSION: Negative. Electronically Signed   By: Donavan Foil M.D.   On: 01/24/2020 17:11    Procedures Procedures (including critical care time)  Medications Ordered in UC Medications - No data to display  Initial Impression / Assessment and Plan / UC Course  I have reviewed the triage vital signs and the nursing notes.  Pertinent labs & imaging results that were available during my care of the patient were reviewed by me and considered in my medical decision making (see chart for details).    Xray negative for fracture or dislocation. Continue NSAIDs, ice compress. Patient to follow up with podiatrist/orthopedics if numbness to toe has not improved. Patient expresses understanding and agrees to plan.  Final Clinical Impressions(s) / UC Diagnoses   Final diagnoses:  Left  foot pain   ED Prescriptions    Medication Sig Dispense Auth. Provider   diclofenac Sodium (VOLTAREN) 1 % GEL Apply 2 g topically 4 (four) times daily. 100 g Ok Edwards, PA-C     PDMP not reviewed this encounter.   Ok Edwards, PA-C 01/24/20 1754

## 2020-01-24 NOTE — Telephone Encounter (Signed)
I am unable to complete these Rx's.

## 2020-01-24 NOTE — Discharge Instructions (Addendum)
X-ray negative for fracture or dislocation.  You can switch to naproxen for 440 mg twice a day for the next 5 to 7 days.  Can add on voltaren gel as directed.  Ice compress, rest, elevation.  Follow-up with foot center for further evaluation if numbness to the toe does not improve.

## 2020-01-24 NOTE — ED Triage Notes (Signed)
Patient presents to [facility] for assessment after her 280lb son jumped off of a step and down on to her foot one week ago and she has had pain since.  Patient states yesterday she slipped yesterday and had increased pain and developing bruising.  Patient c/o numbness to 2nd digit and swelling to third.

## 2020-01-27 ENCOUNTER — Inpatient Hospital Stay: Payer: Medicare Other | Admitting: Hematology and Oncology

## 2020-01-28 ENCOUNTER — Encounter: Payer: Self-pay | Admitting: Hematology and Oncology

## 2020-01-29 ENCOUNTER — Telehealth: Payer: Self-pay

## 2020-01-29 NOTE — Telephone Encounter (Signed)
PROGRESS NOTE: Sent schedule message per patient request to get her missed appointment on 01/27/20 rescheduled. Also let schedulers know to call patient and let her know date and time of new appointment.

## 2020-01-30 MED FILL — CARISOPRODOL 350 MG TABS: 350 | 90 days supply | Qty: 360 | Fill #0

## 2020-02-04 ENCOUNTER — Inpatient Hospital Stay: Payer: Medicare Other | Attending: Hematology and Oncology | Admitting: Hematology and Oncology

## 2020-02-04 ENCOUNTER — Other Ambulatory Visit: Payer: Self-pay

## 2020-02-04 ENCOUNTER — Encounter: Payer: Self-pay | Admitting: Hematology and Oncology

## 2020-02-04 ENCOUNTER — Telehealth: Payer: Self-pay | Admitting: Hematology and Oncology

## 2020-02-04 DIAGNOSIS — C719 Malignant neoplasm of brain, unspecified: Secondary | ICD-10-CM

## 2020-02-04 DIAGNOSIS — G44021 Chronic cluster headache, intractable: Secondary | ICD-10-CM

## 2020-02-04 DIAGNOSIS — C711 Malignant neoplasm of frontal lobe: Secondary | ICD-10-CM | POA: Diagnosis not present

## 2020-02-04 DIAGNOSIS — G40109 Localization-related (focal) (partial) symptomatic epilepsy and epileptic syndromes with simple partial seizures, not intractable, without status epilepticus: Secondary | ICD-10-CM

## 2020-02-04 DIAGNOSIS — M79672 Pain in left foot: Secondary | ICD-10-CM | POA: Insufficient documentation

## 2020-02-04 MED ORDER — HYDROCODONE-ACETAMINOPHEN 10-325 MG PO TABS: 1.0000 | ORAL_TABLET | Freq: Three times a day (TID) | ORAL | 0 refills | Status: DC | PRN

## 2020-02-04 MED FILL — HYDROCODON-APAP 10-325: 10-325 | 30 days supply | Qty: 90 | Fill #0

## 2020-02-04 NOTE — Assessment & Plan Note (Signed)
She has multifactorial headaches Previously, we discussed medication taper Due to her ongoing issues, I do not plan to taper her medications For now, I will continue to refill Fioricet at every 8 hours as needed today Her prescription of hydrocodone is at every 8 hours as needed as well, refilled today I plan to keep those medications at the dose above and will taper her medications again in around October

## 2020-02-04 NOTE — Assessment & Plan Note (Signed)
She has recent foot injury She requested referral to podiatrist for further evaluation and management and I will try to help with that referral

## 2020-02-04 NOTE — Assessment & Plan Note (Signed)
She is undergoing extensive evaluation with plan for surgery at [facility] I would defer to them for further follow-up and imaging studies

## 2020-02-04 NOTE — Assessment & Plan Note (Signed)
She is undergoing multiple evaluation including EEG and medication adjustment at [facility] I will defer to them for further management For now, I do not plan to taper Soma or lorazepam

## 2020-02-04 NOTE — Telephone Encounter (Signed)
Per 7/20 los, no changes made to patient schedule

## 2020-02-04 NOTE — Progress Notes (Signed)
HEMATOLOGY-ONCOLOGY ELECTRONIC VISIT PROGRESS NOTE  Patient Care Team: Griffin, John, MD as PCP - General (Internal Medicine) Cabbell, Kyle, MD as Consulting Physician (Neurosurgery) Tatter, Stephen B., MD (Inactive) as Consulting Physician (Neurosurgery) Tatter, Stephen B., MD (Inactive) as Consulting Physician (Neurosurgery) Bobbitt, Ralph Carter, MD as Consulting Physician (Allergy and Immunology) Aquino, Karen M, MD as Consulting Physician (Neurology)  I connected with by EPIC video conference  Due to the patient traveling, today's visit is converted into a phone visit only  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 She is undergoing extensive evaluation with plan for surgery at [facility] I would defer to them for further follow-up and imaging studies  Simple partial seizures She is undergoing multiple evaluation including EEG and medication adjustment at [facility] I will defer to them for further management For now, I do not plan to taper Soma or lorazepam  Intractable chronic cluster headache She has multifactorial headaches Previously, we discussed medication taper Due to her ongoing issues, I do not plan to taper her medications For now, I will continue to refill Fioricet at every 8 hours as needed today Her prescription of hydrocodone is at every 8 hours as needed as well, refilled today I plan to keep those medications at the dose above and will taper her medications again in around October  Left foot pain She has recent foot injury She requested referral to podiatrist for further evaluation and management and I will try to help with that referral   Orders Placed This Encounter  Procedures  . Ambulatory referral to Podiatry    Referral Priority:   Routine    Referral Type:   Consultation    Referral Reason:   Specialty Services Required    Requested  Specialty:   Podiatry    Number of Visits Requested:   1    INTERVAL HISTORY: Please see below for problem oriented charting. 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   09/27/2013 Pathology Results   Accession: [id number] pathology showed grade 2 oligodendroglioma with mutant IDH1 protein, ATRX, OLIG 2 expression. p53 is negative, low Ki-67 labeling index. Molecular SNP array studies demonstrate whole arm 1p19q codeletion."   10/28/2013 Imaging   Ct scan of head showed possible abscess.   11/06/2013 Imaging   Repeat CT scan of the head showed resolution of abscess.   01/23/2014 Surgery   Dr. Cabbell perform a subtotal gross resection of the tumor.   01/24/2014 Imaging   Repeat imaging studies showed persistent and residual disease.   03/03/2014 - 09/01/2014 Chemotherapy   She was started on Temodar, 5 days every 28 days   03/13/2014 Imaging   Repeat MRI showed no evidence of progression of disease.   07/14/2014   Imaging   Repeat MRI showed no evidence of disease progression.   09/12/2014 Imaging   MRI of the liver show mild regression of the size of lesions, presumed to be benign   09/15/2014 Imaging   Repeat MRI of the brain show persistent abnormalities, suspicious for residual disease     03/02/2015 Imaging    MRI brain at [facility] show persistent abnormalities   08/10/2015 Imaging   Repeat MRI [facility] showed stable disease   08/24/2015 - 02/08/2016 Chemotherapy   She was started on Lomustine, Procarbazine and Vincristine chemotherapy   08/30/2015 Imaging   CT chest showed no evidence of aspiration pneumonia   11/18/2015 Imaging   MRI brain showed status post resection of right frontal lobe tumor without residual or recurrent tumor. 2. Otherwise negative MRI of the brain.    03/02/2016 Imaging   Stable MRI post right frontal lobe tumor resection. Negative for recurrent tumor   06/01/2016 Imaging   Stable MRI post right frontal lobe tumor resection. No recurrent tumor identified   08/19/2016 Imaging   Unchanged examination without tumor recurrence.   11/17/2016 Imaging   Stable right frontal resection cavity. Stable focal mass effect within the right middle frontal gyrus at the superior margin of the cavity which may represent nonenhancing neoplasm. No evidence for tumor progression. Otherwise stable MRI of the brain   12/16/2016 Imaging   MR brain: 1. No acute intracranial abnormality. 2. Unchanged appearance of right frontal lobe resection cavity and surrounding hyperintense T2 weighted signal.   02/04/2017 Imaging   Repeat MRI Normal pituitary  Stable postop changes of right frontal tumor resection   05/12/2017 Imaging   The left ventricular size is normal. There is normal left ventricular wall thickness.  Left ventricular systolic function is normal. LV ejection fraction = 60-65%.  Left ventricular filling pattern is normal. The left ventricular wall motion is normal. The right ventricle is normal in size and function. There is no significant valvular stenosis or regurgitation No pulmonary hypertension. IVC size was normal. There is no pericardial effusion. There is no comparison study available   05/13/2017 Imaging   1.No evidence of acute pulmonary  thromboembolic disease. 2.There is an 8 mm right lower lobe groundglass nodule. Statistically, in this age group, this is most likely to reflect benign etiologies such as a tiny focus of infection or nonspecific inflammation. 4 mm solid right upper lobe lung nodule. A CT scan could be obtained in 12 months to further evaluate if the patient has any known risk factors for malignancy. 3.There is a 1.3 cm left adrenal nodule. This finding is incompletely characterized although and is slightly increased in size relative to the abdominal MRI from February 2016. Although this finding is most likely benign, recommend follow-up adrenal protocol CT in one year.   05/13/2017 Imaging   MRI brain 1.No acute intracranial abnormality. 2.Postsurgical changes of a right frontal craniotomy for resection of a reported oligodendroglioma. Hyperintense T2/FLAIR signal around the resection cavity and in the subcortical white matter of a gyrus along the superior margin of the cavity is unchanged from 01/05/2015. No evidence of disease progression.   12/15/2017 Imaging   MRI brain at [facility] 1.No acute intracranial abnormality. 2.Postsurgical changes of right frontal craniotomy for resection of reported oligodendroglioma. No evidence of disease progression   12/28/2017 Procedure   Successful right IJ vein Port-A-Cath explant.   04/24/2018 Imaging   Stable post treatment appearance, RIGHT frontal oligodendroglioma.  No posttraumatic sequelae are evident.   01/30/2019   Imaging   Stable appearance of treated right frontal oligodendroglioma. No change from 04/24/2018.   11/18/2019 Imaging   1. Stable postoperative changes of the right frontal lobe without evidence for residual or recurrent disease. 2. No acute intracranial abnormality or significant interval change.     REVIEW OF SYSTEMS:   Constitutional: Denies fevers, chills or abnormal weight loss Eyes: Denies blurriness of vision Ears, nose,  mouth, throat, and face: Denies mucositis or sore throat Respiratory: Denies cough, dyspnea or wheezes Cardiovascular: Denies palpitation, chest discomfort Gastrointestinal:  Denies nausea, heartburn or change in bowel habits Skin: Denies abnormal skin rashes Lymphatics: Denies new lymphadenopathy or easy bruising Neurological:Denies numbness, tingling or new weaknesses Behavioral/Psych: Mood is stable, no new changes  Extremities: No lower extremity edema All other systems were reviewed with the patient and are negative.  I have reviewed the past medical history, past surgical history, social history and family history with the patient and they are unchanged from previous note.  ALLERGIES:  is allergic to adhesive [tape], latex, medical adhesive remover, percocet [oxycodone-acetaminophen], prednisone, shellfish allergy, soma [carisoprodol], tramadol hcl, vancomycin, and zofran [ondansetron hcl].  MEDICATIONS:  Current Outpatient Medications  Medication Sig Dispense Refill  . butalbital-acetaminophen-caffeine (FIORICET) 50-325-40 MG tablet Take 1 tablet by mouth every 8 (eight) hours as needed for headache. 90 tablet 0  . carisoprodol (SOMA) 350 MG tablet Take 1 tablet (350 mg total) by mouth 4 (four) times daily. 360 tablet 1  . Cyanocobalamin (VITAMIN B-12 PO) Take 5,000 Units daily by mouth.     . diclofenac Sodium (VOLTAREN) 1 % GEL Apply 2 g topically 4 (four) times daily. 100 g 0  . divalproex (DEPAKOTE ER) 250 MG 24 hr tablet Take 2 tabs in AM, 3 tabs in PM (Patient taking differently: 1,000 mg in the morning and at bedtime. Take 1027m BID) 450 tablet 3  . eletriptan (RELPAX) 20 MG tablet Take 1 tablet as needed for migraine. Do not take more than 2-3 a week 10 tablet 5  . EPINEPHRINE 0.3 mg/0.3 mL IJ SOAJ injection INJECT 0.3 MLS (ONE SYRINGE) INTO THE MUSCLE ONCE FOR 1 DOSE AS NEEDED. (Patient taking differently: Inject 0.3 mg into the muscle once. ) 0.3 mL 0  . ergocalciferol  (VITAMIN D2) 1.25 MG (50000 UT) capsule Take 1 capsule (50,000 Units total) by mouth once a week. 12 capsule 3  . gabapentin (NEURONTIN) 300 MG capsule TAKE 4 CAPSULES IN AM/3 CAPSULES IN PM 210 capsule 0  . HYDROcodone-acetaminophen (NORCO) 10-325 MG tablet Take 1 tablet by mouth 3 (three) times daily as needed. 90 tablet 0  . levocetirizine (XYZAL) 5 MG tablet TAKE 1 TABLET (5 MG TOTAL) BY MOUTH EVERY EVENING. 30 tablet 5  . LORazepam (ATIVAN) 0.5 MG tablet Take 1 tablet (0.5 mg total) by mouth every 8 (eight) hours as needed for anxiety or seizure. 90 tablet 0  . montelukast (SINGULAIR) 10 MG tablet Take 1 tablet (10 mg total) by mouth at bedtime. 30 tablet 9  . naproxen (NAPROSYN) 250 MG tablet Take by mouth 3 (three) times daily with meals.    . Rimegepant Sulfate (NURTEC) 75 MG TBDP Take 1 tablet by mouth as needed (Take 1 tablet as needed for migraine. Do not take more than 2-3 times a week). 10 tablet 11  . zolmitriptan (ZOMIG) 5 MG tablet Take 1 tablet as needed at onset of migraine. Do not take more than 2-3 a week 10 tablet 11   No current facility-administered medications for  this visit.    PHYSICAL EXAMINATION: ECOG PERFORMANCE STATUS: 1 - Symptomatic but completely ambulatory  LABORATORY DATA:  I have reviewed the data as listed CMP Latest Ref Rng & Units 11/18/2019 08/07/2019 03/26/2018  Glucose 70 - 99 mg/dL 97 102(H) 92  BUN 6 - 20 mg/dL 13 12 11  Creatinine 0.44 - 1.00 mg/dL 0.66 0.62 0.55  Sodium 135 - 145 mmol/L 145 140 140  Potassium 3.5 - 5.1 mmol/L 4.1 4.8 3.6  Chloride 98 - 111 mmol/L 109 102 105  CO2 22 - 32 mmol/L 27 28 26  Calcium 8.9 - 10.3 mg/dL 8.6(L) 9.0 9.1  Total Protein 6.5 - 8.1 g/dL 6.7 - -  Total Bilirubin 0.3 - 1.2 mg/dL <0.2(L) - -  Alkaline Phos 38 - 126 U/L 48 - -  AST 15 - 41 U/L 14(L) - -  ALT 0 - 44 U/L 13 - -    Lab Results  Component Value Date   WBC 5.3 11/18/2019   HGB 12.3 11/18/2019   HCT 37.3 11/18/2019   MCV 98.9 11/18/2019   PLT  196 11/18/2019   NEUTROABS 3.9 11/18/2019     RADIOGRAPHIC STUDIES: I have personally reviewed the radiological images as listed and agreed with the findings in the report. DG Foot Complete Left  Result Date: 01/24/2020 CLINICAL DATA:  Injury with swelling and bruising EXAM: LEFT FOOT - COMPLETE 3+ VIEW COMPARISON:  None. FINDINGS: There is no evidence of fracture or dislocation. There is no evidence of arthropathy or other focal bone abnormality. Soft tissues are unremarkable. IMPRESSION: Negative. Electronically Signed   By: Kim  Fujinaga M.D.   On: 01/24/2020 17:11    I discussed the assessment and treatment plan with the patient. The patient was provided an opportunity to ask questions and all were answered. The patient agreed with the plan and demonstrated an understanding of the instructions. The patient was advised to call back or seek an in-person evaluation if the symptoms worsen or if the condition fails to improve as anticipated.    I spent 20 minutes for the appointment reviewing test results, discuss management and coordination of care.   , MD 02/04/2020 12:41 PM 

## 2020-02-07 ENCOUNTER — Other Ambulatory Visit: Payer: Self-pay

## 2020-02-07 ENCOUNTER — Encounter: Payer: Self-pay | Admitting: Neurology

## 2020-02-07 ENCOUNTER — Telehealth (INDEPENDENT_AMBULATORY_CARE_PROVIDER_SITE_OTHER): Payer: Medicare Other | Admitting: Neurology

## 2020-02-07 DIAGNOSIS — G43709 Chronic migraine without aura, not intractable, without status migrainosus: Secondary | ICD-10-CM | POA: Diagnosis not present

## 2020-02-07 DIAGNOSIS — G40109 Localization-related (focal) (partial) symptomatic epilepsy and epileptic syndromes with simple partial seizures, not intractable, without status epilepticus: Secondary | ICD-10-CM

## 2020-02-07 DIAGNOSIS — C719 Malignant neoplasm of brain, unspecified: Secondary | ICD-10-CM

## 2020-02-07 DIAGNOSIS — IMO0002 Reserved for concepts with insufficient information to code with codable children: Secondary | ICD-10-CM

## 2020-02-07 MED ORDER — NURTEC 75 MG PO TBDP: 1.0000 | ORAL_TABLET | ORAL | 0 refills | Status: DC | PRN

## 2020-02-07 MED ORDER — ZOLMITRIPTAN 5 MG PO TABS: ORAL_TABLET | ORAL | 0 refills | Status: DC

## 2020-02-07 MED ORDER — GABAPENTIN 300 MG PO CAPS: ORAL_CAPSULE | ORAL | 0 refills | Status: DC

## 2020-02-07 MED FILL — NURTEC 75 MG TBDP: 75 | 28 days supply | Qty: 8 | Fill #0

## 2020-02-07 NOTE — Progress Notes (Signed)
Virtual Visit via Video Note The purpose of this virtual visit is to provide medical care while limiting exposure to the novel coronavirus.    Consent was obtained for video visit:  Yes.   Answered questions that patient had about telehealth interaction:  Yes.   I discussed the limitations, risks, security and privacy concerns of performing an evaluation and management service by telemedicine. I also discussed with the patient that there may be a patient responsible charge related to this service. The patient expressed understanding and agreed to proceed.  Pt location: Home Physician Location: office Name of referring provider:  Lavone Orn, MD I connected with Victoria Bates at patients initiation/request on 02/07/2020 at  4:00 PM EDT by video enabled telemedicine application and verified that I am speaking with the correct person using two identifiers. Pt MRN:  [id number] Pt DOB:  05-08-1981 Video Participants:  Victoria Bates   History of Present Illness:  The patient was seen as a virtual video visit on 02/07/2020. She was last seen in the neurology clinic 7 months ago for seizures. She has a history of right frontal oligodendroglioma s/p subtotal resection. Since her last visit, she started having more headaches and seizures and had repeat MRI in 11/2019. She has been evaluated by Dr. Salomon Fick at [facility], with note of modest increase in size in the bulk of FLAIR/T2 hyperintensity, felt most likely represents growth. She has seen Radiation Oncology, as well as Neuro-oncology at [facility]. With her last visit with neurooncologist Dr. Clovis Riley, she states he wants to get her headaches and seizures under better control before proceeding with any intervention. Her Depakote was increased to 1000mg  BID. She is listed to be taking gabapentin 300mg  4 tabs in AM, 3 tabs in PM, but states that she had misunderstood and has been taking 4 tabs BID for a while now with no side effects. The higher dose of  Depakote makes her drowsy. She has not noticed a change in headaches, she also gets Botox (has had 2 sessions). She feels the increase in Depakote has helped some with the seizures. Her last seizure with loss of consciousness was 1.5 months ago, she also reports episodes where she has tremors, speech is really bad, she is amnestic of them, and her husband says she goes blank, last episode was last weekend. Prior to this was a couple of weeks before. She is scheduled for a 3-day EEG and MRI brain, then has a follow-up with Dr. Clovis Riley. Depending on results, they will decide on surgery before or after the holidays. 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 She was admitted to [facility] last 09/19/2013 due to left vision changes and left facial heaviness. She reports that she had a 60-lb unintentional weight loss 4 months leading up to her admission, she was vomiting after every meal. She started having episodes where her hands would have low amplitude tremors and she feels the right lower face twitching. She would need to pause and take a minute to get words out. If she did not stop what she was doing, the symptoms could last for hours. She would feel like her body was "going 90 mph"  with palpitations up to 200 bpm. She was started on Xanax for anxiety, but this did not help. She then started having vision changes on her left eye, she would park crookedly when she thought she was going straight. She saw an ophthalmologist, eye exam was normal but left optic neuritis was questioned and brain imaging recommended. She then started to have left facial heaviness and numbness on the left face, arm, and leg, and  drove herself to [facility] where she was found to have a right frontal lesion. She underwent subtotal resection on 01/23/14 by Dr. Christella Noa and was started on Keppra for seizure prophylaxis at that time. She continued to have the recurrent episodes of tremors in both arms, palpitations, and right facial tightness, as well as episodes of left face, neck, shoulder, and arm numbness, going down the left thumb and index finger. She describes her major seizures as "all of that times 100." After the episodes, she would be tired for a few days, wanting to sleep, with no energy. She has noticed that her short term memory is "gone." Keppra dose had been increased up to 2 grams daily, and this had caused a change in personality. She had seen neurologist Dr. Leonie Man, who switched her to Depakote. She tapered off Keppra and is currently on Depakote ER 500mg  BID with significant improvement in symptoms. She denies any side effects to the Depakote.   She has a history of recurrent headaches since her teenage years. Headaches are usually over the right temporal and occipital region, with sharp stabbing and throbbing pain, with a knot in her left neck region. There is occasional nausea, photo, and phonophobia. She was having these headaches around 1-2 times a week when younger, however since December 2014, headaches have been occurring around 2-3 times a week. She had been receiving injections in her neck for this. If she takes medication at the onset, they last a few hours, however they may last up to several days. She has found that taking Decadron for a couple of days has been the only helpful medication. She takes Fioricet rarely. Zomig, aspirin, and Tylenol were ineffective in the past. Her father and 70 year old son have headaches. She denies any diplopia, dysarthria, dysphagia, bowel/bladder dysfunction. She has chronic back pain and had been taking Soma qid for several years, with worsening of pain when she tried reducing this.  She was started on Gabapentin for the seizures and back pain, she is unsure if this helps. She had been taking Ultram for the pain, and once stopping this, she noticed an improvement in the seizures as well.   Diagnostic Data: Routine EEG done at [facility] reported amplitude asymmetry in the right frontotemporal regions which is likely from breach artifact from prior craniotomy. There is intermittent right frontal sharp activity but no definite epileptiform activity. Impression: Mild right hemispheric focal cortical irritability but no definitive epileptiform activity is noted.  Her 48-hour EEG did not show any epileptiform discharges. It showed breach artifact consistent with prior surgery. She reported numerous symptoms including headache, severe muscle spasm under the base of her skull on the right side, tremors in both hands, jerking and pause in jaw (right side), left leg/inner thigh and left foot warm and pigmented, eyes twitching, fatigue, with no associated epileptiform correlate seen. Inpatient video EEG monitoring for 3 days at [facility], June 23 to 25, 2016: Depakote and Gabapentin were held. Typical events were not clearly captured, however she reported 2 spells where she reported dizziness and  visual changes (tiles on floor looked uneven) with no EEG correlate. Baseline EEG showed breach rhythm over the right hemisphere, no epileptiform discharges or electrographic seizures.  I personally reviewed MRI brain with and without contrast done 03/13/14 which showed post-op right-sided craniotomy. The surgical cavity in the right frontal lobe is filled with fluid, with note of improvement in layering blood products since prior study. The study of 01/24/2014 showed restricted diffusion deep to the tumor. This is consistent with acute infarct which is resolving at this time. No acute infarct is present on today's study. No evidence of recurrent or residual tumor although the original tumor did not enhance. No  mass effect or edema is present that would suggest recurrent tumor. Small amount of subarachnoid hemorrhage on the right again noted. MRI 11/2015 which I personally reviewed, there is similar pattern of enhancement and abnormal FLAIR signal in the right frontal gyrus immediately superior to the resection, stable from previous scan.   Epilepsy Risk Factors: Right frontal oligodendroglioma s/p subtotal resection. Otherwise she had a normal birth and early development. There is no history of febrile convulsions, CNS infections such as meningitis/encephalitis, or family history of seizures.    Current Outpatient Medications on File Prior to Visit  Medication Sig Dispense Refill  . butalbital-acetaminophen-caffeine (FIORICET) 50-325-40 MG tablet Take 1 tablet by mouth every 8 (eight) hours as needed for headache. 90 tablet 0  . carisoprodol (SOMA) 350 MG tablet Take 1 tablet (350 mg total) by mouth 4 (four) times daily. 360 tablet 1  . Cyanocobalamin (VITAMIN B-12 PO) Take 5,000 Units daily by mouth.     . diclofenac Sodium (VOLTAREN) 1 % GEL Apply 2 g topically 4 (four) times daily. 100 g 0  . divalproex (DEPAKOTE ER) 500 MG 24 hr tablet Take 500 mg by mouth daily. Take 2 tabs twice a day    . EPINEPHRINE 0.3 mg/0.3 mL IJ SOAJ injection INJECT 0.3 MLS (ONE SYRINGE) INTO THE MUSCLE ONCE FOR 1 DOSE AS NEEDED. (Patient taking differently: Inject 0.3 mg into the muscle once. ) 0.3 mL 0  . ergocalciferol (VITAMIN D2) 1.25 MG (50000 UT) capsule Take 1 capsule (50,000 Units total) by mouth once a week. 12 capsule 3  . gabapentin (NEURONTIN) 300 MG capsule TAKE 4 CAPSULES IN AM/3 CAPSULES IN PM (Patient taking 4 caps BID) 210 capsule 0  . HYDROcodone-acetaminophen (NORCO) 10-325 MG tablet Take 1 tablet by mouth 3 (three) times daily as needed. 90 tablet 0  . levocetirizine (XYZAL) 5 MG tablet TAKE 1 TABLET (5 MG TOTAL) BY MOUTH EVERY EVENING. 30 tablet 5  . LORazepam (ATIVAN) 0.5 MG tablet Take 1 tablet  (0.5 mg total) by mouth every 8 (eight) hours as needed for anxiety or seizure. 90 tablet 0  . montelukast (SINGULAIR) 10 MG tablet Take 1 tablet (10 mg total) by mouth at bedtime. 30 tablet 9  . naproxen (NAPROSYN) 250 MG tablet Take by mouth 3 (three) times daily with meals.    . Rimegepant Sulfate (NURTEC) 75 MG TBDP Take 1 tablet by mouth as needed (Take 1 tablet as needed for migraine. Do not take more than 2-3 times a week). 10 tablet 11  . zolmitriptan (ZOMIG) 5 MG tablet Take 1 tablet as needed at onset of migraine. Do not take more than 2-3 a week 10 tablet 11   No current facility-administered medications on file prior to visit.     Observations/Objective:   GEN:  The patient appears stated age and is  in NAD.  Neurological examination: Patient is awake, alert, oriented x 3. No aphasia or dysarthria. Intact fluency and comprehension. Remote and recent memory impaired. Cranial nerves: Extraocular movements intact with no nystagmus. No facial asymmetry. Motor: moves all extremities symmetrically, at least anti-gravity x 4.    Assessment and Plan:   This is a 39 yo RH woman with a history of grade 2 oligodengroglioma s/p subtotal resection on chemotherapy, anxiety, headaches, with recurrent episodes of low amplitude tremors, palpitations, and right facial tightening, as well as left face and arm numbness. She had a 48-hour EEG which did not show any epileptiform discharges. Her 3-day video EEG monitoring at [facility] showed breach artifact, otherwise no epileptiform discharges. She did not have typical events and hence these could not be commented on. She was reporting an increase in seizures, and had another 72-hour EEG in 2019 which did not show any epileptiform discharges, she had multiple push button events for dizziness, stuttering, headache, word-finding difficulties, with no EEG correlate. At times her heart rate went up to 150bpm during these events. She had worsening headaches  and seizures earlier this year and had repeat MRI brain in 11/2019, Dr. Salomon Fick at [facility] reviewed and noted increased bulk concerning for growth. She is currently undergoing further workup with Neuro-oncology at [facility] with another 3-day EEG and MRI brain planned, then may proceed with surgery/radiation later on this year. We discussed continuation of Depakote 1000mg  BID and Gabapentin 300mg  4 caps BID, further medication changes deferred to Dr. Clovis Riley. She has prn Zomig and Nurtec that she alternates for migraine rescue, continue Botox every 3 months. She does not drive. Follow-up as needed, she knows to call for any changes.    Follow Up Instructions:    -I discussed the assessment and treatment plan with the patient. The patient was provided an opportunity to ask questions and all were answered. The patient agreed with the plan and demonstrated an understanding of the instructions.   The patient was advised to call back or seek an in-person evaluation if the symptoms worsen or if the condition fails to improve as anticipated.    Cameron Sprang, MD

## 2020-02-10 MED FILL — GABAPENTIN 300 MG CAPSULE: 300 | 30 days supply | Qty: 240 | Fill #0

## 2020-02-12 DIAGNOSIS — Z23 Encounter for immunization: Secondary | ICD-10-CM | POA: Diagnosis not present

## 2020-02-14 DIAGNOSIS — G40909 Epilepsy, unspecified, not intractable, without status epilepticus: Secondary | ICD-10-CM | POA: Diagnosis not present

## 2020-02-14 DIAGNOSIS — C711 Malignant neoplasm of frontal lobe: Secondary | ICD-10-CM | POA: Diagnosis not present

## 2020-02-15 DIAGNOSIS — G40909 Epilepsy, unspecified, not intractable, without status epilepticus: Secondary | ICD-10-CM | POA: Diagnosis not present

## 2020-02-15 DIAGNOSIS — C711 Malignant neoplasm of frontal lobe: Secondary | ICD-10-CM | POA: Diagnosis not present

## 2020-02-16 DIAGNOSIS — C711 Malignant neoplasm of frontal lobe: Secondary | ICD-10-CM | POA: Diagnosis not present

## 2020-02-16 DIAGNOSIS — G40909 Epilepsy, unspecified, not intractable, without status epilepticus: Secondary | ICD-10-CM | POA: Diagnosis not present

## 2020-02-17 DIAGNOSIS — C711 Malignant neoplasm of frontal lobe: Secondary | ICD-10-CM | POA: Diagnosis not present

## 2020-02-17 DIAGNOSIS — G40909 Epilepsy, unspecified, not intractable, without status epilepticus: Secondary | ICD-10-CM | POA: Diagnosis not present

## 2020-02-20 ENCOUNTER — Other Ambulatory Visit: Payer: Self-pay | Admitting: Hematology and Oncology

## 2020-02-21 DIAGNOSIS — G43909 Migraine, unspecified, not intractable, without status migrainosus: Secondary | ICD-10-CM | POA: Diagnosis not present

## 2020-02-21 DIAGNOSIS — C711 Malignant neoplasm of frontal lobe: Secondary | ICD-10-CM | POA: Diagnosis not present

## 2020-02-21 DIAGNOSIS — R519 Headache, unspecified: Secondary | ICD-10-CM | POA: Diagnosis not present

## 2020-02-21 DIAGNOSIS — Z923 Personal history of irradiation: Secondary | ICD-10-CM | POA: Diagnosis not present

## 2020-02-21 DIAGNOSIS — R42 Dizziness and giddiness: Secondary | ICD-10-CM | POA: Diagnosis not present

## 2020-02-21 DIAGNOSIS — R569 Unspecified convulsions: Secondary | ICD-10-CM | POA: Diagnosis not present

## 2020-02-21 MED ORDER — BUTALBITAL-APAP-CAFFEINE 50-325-40 MG PO TABS: 1.0000 | ORAL_TABLET | Freq: Three times a day (TID) | ORAL | 0 refills | Status: DC | PRN

## 2020-02-21 MED FILL — BUTALB-ACETAMIN-CAFF 50-325: 50-325-40 | 30 days supply | Qty: 90 | Fill #0

## 2020-02-24 ENCOUNTER — Other Ambulatory Visit: Payer: Self-pay

## 2020-02-24 ENCOUNTER — Inpatient Hospital Stay: Payer: Medicare Other | Attending: Hematology and Oncology

## 2020-02-24 DIAGNOSIS — E559 Vitamin D deficiency, unspecified: Secondary | ICD-10-CM

## 2020-02-24 DIAGNOSIS — G40109 Localization-related (focal) (partial) symptomatic epilepsy and epileptic syndromes with simple partial seizures, not intractable, without status epilepticus: Secondary | ICD-10-CM

## 2020-02-24 DIAGNOSIS — G44021 Chronic cluster headache, intractable: Secondary | ICD-10-CM

## 2020-02-24 DIAGNOSIS — C711 Malignant neoplasm of frontal lobe: Secondary | ICD-10-CM | POA: Insufficient documentation

## 2020-02-24 DIAGNOSIS — C719 Malignant neoplasm of brain, unspecified: Secondary | ICD-10-CM

## 2020-02-24 DIAGNOSIS — R4702 Dysphasia: Secondary | ICD-10-CM

## 2020-02-24 DIAGNOSIS — R110 Nausea: Secondary | ICD-10-CM

## 2020-02-24 DIAGNOSIS — E271 Primary adrenocortical insufficiency: Secondary | ICD-10-CM

## 2020-02-24 LAB — CBC WITH DIFFERENTIAL (CANCER CENTER ONLY)
Abs Immature Granulocytes: 0.01 10*3/uL (ref 0.00–0.07)
Basophils Absolute: 0 10*3/uL (ref 0.0–0.1)
Basophils Relative: 1 %
Eosinophils Absolute: 0.1 10*3/uL (ref 0.0–0.5)
Eosinophils Relative: 2 %
HCT: 38.7 % (ref 36.0–46.0)
Hemoglobin: 13 g/dL (ref 12.0–15.0)
Immature Granulocytes: 0 %
Lymphocytes Relative: 33 %
Lymphs Abs: 1 10*3/uL (ref 0.7–4.0)
MCH: 32.6 pg (ref 26.0–34.0)
MCHC: 33.6 g/dL (ref 30.0–36.0)
MCV: 97 fL (ref 80.0–100.0)
Monocytes Absolute: 0.3 10*3/uL (ref 0.1–1.0)
Monocytes Relative: 11 %
Neutro Abs: 1.6 10*3/uL — ABNORMAL LOW (ref 1.7–7.7)
Neutrophils Relative %: 53 %
Platelet Count: 248 10*3/uL (ref 150–400)
RBC: 3.99 MIL/uL (ref 3.87–5.11)
RDW: 11.8 % (ref 11.5–15.5)
WBC Count: 3.1 10*3/uL — ABNORMAL LOW (ref 4.0–10.5)
nRBC: 0 % (ref 0.0–0.2)

## 2020-02-24 LAB — VALPROIC ACID LEVEL: Valproic Acid Lvl: 48 ug/mL — ABNORMAL LOW (ref 50.0–100.0)

## 2020-02-24 LAB — CMP (CANCER CENTER ONLY)
ALT: 15 U/L (ref 0–44)
AST: 17 U/L (ref 15–41)
Albumin: 4.1 g/dL (ref 3.5–5.0)
Alkaline Phosphatase: 45 U/L (ref 38–126)
Anion gap: 8 (ref 5–15)
BUN: 16 mg/dL (ref 6–20)
CO2: 28 mmol/L (ref 22–32)
Calcium: 9.6 mg/dL (ref 8.9–10.3)
Chloride: 105 mmol/L (ref 98–111)
Creatinine: 0.76 mg/dL (ref 0.44–1.00)
GFR, Est AFR Am: >60 mL/min (ref >60)
GFR, Estimated: >60 mL/min (ref >60)
Glucose, Bld: 87 mg/dL (ref 70–99)
Potassium: 3.9 mmol/L (ref 3.5–5.1)
Sodium: 141 mmol/L (ref 135–145)
Total Bilirubin: 0.5 mg/dL (ref 0.3–1.2)
Total Protein: 7.7 g/dL (ref 6.5–8.1)

## 2020-02-24 LAB — VITAMIN D 25 HYDROXY (VIT D DEFICIENCY, FRACTURES): Vit D, 25-Hydroxy: 24.47 ng/mL — ABNORMAL LOW (ref 30–100)

## 2020-02-24 LAB — CORTISOL: Cortisol, Plasma: 6.7 ug/dL

## 2020-02-28 ENCOUNTER — Ambulatory Visit (INDEPENDENT_AMBULATORY_CARE_PROVIDER_SITE_OTHER): Payer: Medicare Other | Admitting: Neurology

## 2020-02-28 ENCOUNTER — Other Ambulatory Visit: Payer: Self-pay

## 2020-02-28 ENCOUNTER — Encounter: Payer: Self-pay | Admitting: Hematology and Oncology

## 2020-02-28 DIAGNOSIS — G44021 Chronic cluster headache, intractable: Secondary | ICD-10-CM | POA: Diagnosis not present

## 2020-02-28 MED ORDER — ONABOTULINUMTOXINA 100 UNITS IJ SOLR
155.0000 [IU] | Freq: Once | INTRAMUSCULAR | Status: AC
  Administered 2020-02-28: 155 [IU] via INTRAMUSCULAR

## 2020-02-28 MED ORDER — NURTEC 75 MG PO TBDP: 1.0000 | ORAL_TABLET | ORAL | 0 refills | Status: DC | PRN

## 2020-02-28 NOTE — Progress Notes (Signed)
Botulinum Clinic   Procedure Note Botox  Attending: Dr. Kendall Arnell  Preoperative Diagnosis(es): Chronic migraine  Consent obtained from: The patient Benefits discussed included, but were not limited to decreased muscle tightness, increased joint range of motion, and decreased pain.  Risk discussed included, but were not limited pain and discomfort, bleeding, bruising, excessive weakness, venous thrombosis, muscle atrophy and dysphagia.  Anticipated outcomes of the procedure as well as he risks and benefits of the alternatives to the procedure, and the roles and tasks of the personnel to be involved, were discussed with the patient, and the patient consents to the procedure and agrees to proceed. A copy of the patient medication guide was given to the patient which explains the blackbox warning.  Patients identity and treatment sites confirmed Yes.  .  Details of Procedure: Skin was cleaned with alcohol. Prior to injection, the needle plunger was aspirated to make sure the needle was not within a blood vessel.  There was no blood retrieved on aspiration.    Following is a summary of the muscles injected  And the amount of Botulinum toxin used:  Dilution 200 units of Botox was reconstituted with 4 ml of preservative free normal saline. Time of reconstitution: At the time of the office visit (<30 minutes prior to injection)   Injections  155 total units of Botox was injected with a 30 gauge needle.  Injection Sites: L occipitalis: 15 units- 3 sites  R occiptalis: 15 units- 3 sites  L upper trapezius: 15 units- 3 sites R upper trapezius: 15 units- 3 sits          L paraspinal: 10 units- 2 sites R paraspinal: 10 units- 2 sites  Face L frontalis(2 injection sites):10 units   R frontalis(2 injection sites):10 units         L corrugator: 5 units   R corrugator: 5 units           Procerus: 5 units   L temporalis: 20 units R temporalis: 20 units   Agent:  200 units of botulinum Type  A (Onobotulinum Toxin type A) was reconstituted with 4 ml of preservative free normal saline.  Time of reconstitution: At the time of the office visit (<30 minutes prior to injection)     Total injected (Units): 155  Total wasted (Units): none wasted  Patient tolerated procedure well without complications.   Reinjection is anticipated in 3 months.    

## 2020-03-02 ENCOUNTER — Other Ambulatory Visit: Payer: Self-pay | Admitting: Hematology and Oncology

## 2020-03-02 DIAGNOSIS — C719 Malignant neoplasm of brain, unspecified: Secondary | ICD-10-CM

## 2020-03-02 MED ORDER — HYDROCODONE-ACETAMINOPHEN 10-325 MG PO TABS: 1.0000 | ORAL_TABLET | Freq: Three times a day (TID) | ORAL | 0 refills | Status: DC | PRN

## 2020-03-03 MED FILL — HYDROCODON-APAP 10-325: 10-325 | 30 days supply | Qty: 90 | Fill #0

## 2020-03-03 MED FILL — NURTEC 75 MG TBDP: 75 | 28 days supply | Qty: 8 | Fill #0

## 2020-03-16 ENCOUNTER — Other Ambulatory Visit: Payer: Self-pay | Admitting: Neurology

## 2020-03-16 ENCOUNTER — Other Ambulatory Visit: Payer: Self-pay

## 2020-03-16 DIAGNOSIS — G40109 Localization-related (focal) (partial) symptomatic epilepsy and epileptic syndromes with simple partial seizures, not intractable, without status epilepticus: Secondary | ICD-10-CM

## 2020-03-16 MED ORDER — GABAPENTIN 300 MG PO CAPS: ORAL_CAPSULE | ORAL | 3 refills | Status: DC

## 2020-03-16 MED FILL — GABAPENTIN 300 MG CAPSULE: 300 | 30 days supply | Qty: 240 | Fill #0

## 2020-03-16 MED FILL — DIVALPROEX SOD ER 500 MG TA: 500 | 30 days supply | Qty: 120 | Fill #1

## 2020-03-16 NOTE — Telephone Encounter (Signed)
Pls let her know we got a request for refills, can you pls ask patient if she was going to get her Gabapentin and Depakote from Dr. Shaaron Adler at [facility] moving forward? This is what we had discussed but I am happy to be a back up until she sees him. Thanks

## 2020-03-16 NOTE — Telephone Encounter (Signed)
Patient called and said she is waiting until 03/2021 for a new neurologist appointment. She said Dr. Shaaron Adler, her oncologist, is trying to get her moved up in the meantime.  So, for now, she will need refills on gabapentin 300 MG.  [place]

## 2020-03-18 ENCOUNTER — Other Ambulatory Visit: Payer: Self-pay | Admitting: Hematology and Oncology

## 2020-03-20 ENCOUNTER — Other Ambulatory Visit: Payer: Self-pay | Admitting: Hematology and Oncology

## 2020-03-20 MED FILL — NURTEC 75 MG TBDP: 75 | 28 days supply | Qty: 8 | Fill #0

## 2020-03-20 MED FILL — BUTALB-ACETAMIN-CAFF 50-325: 50-325-40 | 30 days supply | Qty: 90 | Fill #0

## 2020-03-20 MED FILL — LEVOCETIRIZINE 5 MG TABLET: 5 | 30 days supply | Qty: 30 | Fill #1

## 2020-03-21 DIAGNOSIS — R519 Headache, unspecified: Secondary | ICD-10-CM | POA: Diagnosis not present

## 2020-03-21 DIAGNOSIS — R531 Weakness: Secondary | ICD-10-CM | POA: Diagnosis not present

## 2020-03-21 DIAGNOSIS — H5711 Ocular pain, right eye: Secondary | ICD-10-CM | POA: Diagnosis not present

## 2020-03-21 DIAGNOSIS — H538 Other visual disturbances: Secondary | ICD-10-CM | POA: Diagnosis not present

## 2020-03-26 DIAGNOSIS — H5340 Unspecified visual field defects: Secondary | ICD-10-CM | POA: Diagnosis not present

## 2020-03-27 DIAGNOSIS — G43109 Migraine with aura, not intractable, without status migrainosus: Secondary | ICD-10-CM | POA: Diagnosis not present

## 2020-03-27 DIAGNOSIS — H5340 Unspecified visual field defects: Secondary | ICD-10-CM | POA: Diagnosis not present

## 2020-03-28 DIAGNOSIS — H04123 Dry eye syndrome of bilateral lacrimal glands: Secondary | ICD-10-CM | POA: Diagnosis not present

## 2020-03-28 DIAGNOSIS — H40033 Anatomical narrow angle, bilateral: Secondary | ICD-10-CM | POA: Diagnosis not present

## 2020-03-30 ENCOUNTER — Other Ambulatory Visit: Payer: Self-pay | Admitting: Hematology and Oncology

## 2020-03-30 DIAGNOSIS — C719 Malignant neoplasm of brain, unspecified: Secondary | ICD-10-CM

## 2020-03-30 MED ORDER — HYDROCODONE-ACETAMINOPHEN 10-325 MG PO TABS: 1.0000 | ORAL_TABLET | Freq: Three times a day (TID) | ORAL | 0 refills | Status: DC | PRN

## 2020-03-30 MED FILL — HYDROCODON-APAP 10-325: 10-325 | 30 days supply | Qty: 90 | Fill #0

## 2020-03-30 NOTE — Telephone Encounter (Signed)
Dr. Alvy Bimler,  For your refusal or approval.  Gardiner Rhyme, RN

## 2020-03-31 NOTE — Telephone Encounter (Signed)
I approved 3 90-day refills on 03/16/20, did she call for more? Pls check if sent to correct pharmacy? Thanks

## 2020-04-13 MED FILL — DIVALPROEX SOD ER 500 MG TA: 500 | 30 days supply | Qty: 120 | Fill #2

## 2020-04-13 MED FILL — LEVOCETIRIZINE 5 MG TABLET: 5 | 30 days supply | Qty: 30 | Fill #2

## 2020-04-13 MED FILL — GABAPENTIN 300 MG CAPSULE: 300 | 30 days supply | Qty: 240 | Fill #1

## 2020-04-17 ENCOUNTER — Other Ambulatory Visit (HOSPITAL_COMMUNITY): Payer: Self-pay | Admitting: Dermatology

## 2020-04-17 DIAGNOSIS — Z79899 Other long term (current) drug therapy: Secondary | ICD-10-CM | POA: Diagnosis not present

## 2020-04-17 DIAGNOSIS — H5711 Ocular pain, right eye: Secondary | ICD-10-CM | POA: Diagnosis not present

## 2020-04-17 DIAGNOSIS — G43109 Migraine with aura, not intractable, without status migrainosus: Secondary | ICD-10-CM | POA: Diagnosis not present

## 2020-04-17 DIAGNOSIS — H179 Unspecified corneal scar and opacity: Secondary | ICD-10-CM | POA: Diagnosis not present

## 2020-04-17 DIAGNOSIS — H3581 Retinal edema: Secondary | ICD-10-CM | POA: Diagnosis not present

## 2020-04-17 DIAGNOSIS — G43909 Migraine, unspecified, not intractable, without status migrainosus: Secondary | ICD-10-CM | POA: Diagnosis not present

## 2020-04-17 DIAGNOSIS — C711 Malignant neoplasm of frontal lobe: Secondary | ICD-10-CM | POA: Diagnosis not present

## 2020-04-17 DIAGNOSIS — H547 Unspecified visual loss: Secondary | ICD-10-CM | POA: Diagnosis not present

## 2020-04-17 MED FILL — DIVALPROEX SOD ER 250 MG TA: 250 | 60 days supply | Qty: 60 | Fill #0

## 2020-04-23 ENCOUNTER — Other Ambulatory Visit: Payer: Self-pay | Admitting: Hematology and Oncology

## 2020-04-23 ENCOUNTER — Other Ambulatory Visit: Payer: Self-pay

## 2020-04-23 DIAGNOSIS — C719 Malignant neoplasm of brain, unspecified: Secondary | ICD-10-CM

## 2020-04-23 MED ORDER — NURTEC 75 MG PO TBDP: 1.0000 | ORAL_TABLET | ORAL | 0 refills | Status: DC | PRN

## 2020-04-23 MED FILL — NURTEC 75 MG TBDP: 75 | 25 days supply | Qty: 10 | Fill #0

## 2020-04-23 MED FILL — BUTALB-ACETAMIN-CAFF 50-325: 50-325-40 | 3 days supply | Qty: 90 | Fill #0

## 2020-04-23 NOTE — Telephone Encounter (Signed)
Error

## 2020-04-24 ENCOUNTER — Other Ambulatory Visit: Payer: Self-pay | Admitting: Hematology and Oncology

## 2020-04-24 MED ORDER — HYDROCODONE-ACETAMINOPHEN 10-325 MG PO TABS: 1.0000 | ORAL_TABLET | Freq: Three times a day (TID) | ORAL | 0 refills | Status: DC | PRN

## 2020-04-27 MED FILL — HYDROCODON-APAP 10-325: 10-325 | 30 days supply | Qty: 90 | Fill #0

## 2020-04-27 MED FILL — CARISOPRODOL 350 MG TABS: 350 | 90 days supply | Qty: 360 | Fill #1

## 2020-05-12 MED FILL — GABAPENTIN 300 MG CAPSULE: 300 | 30 days supply | Qty: 240 | Fill #2

## 2020-05-12 MED FILL — DIVALPROEX SOD ER 500 MG TA: 500 | 30 days supply | Qty: 120 | Fill #3

## 2020-05-14 DIAGNOSIS — M25551 Pain in right hip: Secondary | ICD-10-CM | POA: Diagnosis not present

## 2020-05-14 DIAGNOSIS — M545 Low back pain, unspecified: Secondary | ICD-10-CM | POA: Diagnosis not present

## 2020-05-14 DIAGNOSIS — M5416 Radiculopathy, lumbar region: Secondary | ICD-10-CM | POA: Diagnosis not present

## 2020-05-19 DIAGNOSIS — M25551 Pain in right hip: Secondary | ICD-10-CM | POA: Diagnosis not present

## 2020-05-19 DIAGNOSIS — M545 Low back pain, unspecified: Secondary | ICD-10-CM | POA: Diagnosis not present

## 2020-05-21 ENCOUNTER — Other Ambulatory Visit: Payer: Self-pay

## 2020-05-21 ENCOUNTER — Other Ambulatory Visit: Payer: Self-pay | Admitting: Neurology

## 2020-05-21 ENCOUNTER — Other Ambulatory Visit: Payer: Self-pay | Admitting: Hematology and Oncology

## 2020-05-21 MED ORDER — BUTALBITAL-APAP-CAFFEINE 50-325-40 MG PO TABS: 1.0000 | ORAL_TABLET | Freq: Three times a day (TID) | ORAL | 0 refills | Status: DC | PRN

## 2020-05-21 MED ORDER — LORAZEPAM 0.5 MG PO TABS: 0.5000 mg | ORAL_TABLET | Freq: Three times a day (TID) | ORAL | 0 refills | Status: DC | PRN

## 2020-05-21 MED ORDER — NURTEC 75 MG PO TBDP: 1.0000 | ORAL_TABLET | ORAL | 0 refills | Status: DC | PRN

## 2020-05-21 MED FILL — LORazepam 0.5 MG TABS: 0.5 | 30 days supply | Qty: 90 | Fill #0

## 2020-05-21 MED FILL — BUTALB-ACETAMIN-CAFF 50-325: 50-325-40 | 30 days supply | Qty: 90 | Fill #0

## 2020-05-21 MED FILL — NURTEC 75 MG TBDP: 75 | 30 days supply | Qty: 8 | Fill #0

## 2020-05-22 DIAGNOSIS — M25551 Pain in right hip: Secondary | ICD-10-CM | POA: Diagnosis not present

## 2020-05-25 ENCOUNTER — Other Ambulatory Visit: Payer: Self-pay | Admitting: Hematology and Oncology

## 2020-05-25 DIAGNOSIS — C719 Malignant neoplasm of brain, unspecified: Secondary | ICD-10-CM

## 2020-05-25 MED ORDER — HYDROCODONE-ACETAMINOPHEN 10-325 MG PO TABS: 1.0000 | ORAL_TABLET | Freq: Three times a day (TID) | ORAL | 0 refills | Status: DC | PRN

## 2020-05-25 MED FILL — HYDROCODON-APAP 10-325: 10-325 | 30 days supply | Qty: 90 | Fill #0

## 2020-05-29 ENCOUNTER — Ambulatory Visit: Payer: Medicare Other | Admitting: Neurology

## 2020-05-29 DIAGNOSIS — Z029 Encounter for administrative examinations, unspecified: Secondary | ICD-10-CM

## 2020-06-01 ENCOUNTER — Encounter: Payer: Self-pay | Admitting: Neurology

## 2020-06-01 ENCOUNTER — Telehealth: Payer: Self-pay | Admitting: Neurology

## 2020-06-01 NOTE — Telephone Encounter (Signed)
Pt missed appt on 05-29-20 for her Botox injection with [facility]. When the no show letter was printed out it had the wrong date on it. It had the date of 06-01-20 so we redid the letter with the correct date of 05-29-20 on it and mailed the correct letter out to patient. She did not have appt on 06-01-20

## 2020-06-03 DIAGNOSIS — M5416 Radiculopathy, lumbar region: Secondary | ICD-10-CM | POA: Diagnosis not present

## 2020-06-08 MED FILL — GABAPENTIN 300 MG CAPSULE: 300 | 30 days supply | Qty: 240 | Fill #3

## 2020-06-18 ENCOUNTER — Other Ambulatory Visit: Payer: Self-pay | Admitting: Hematology and Oncology

## 2020-06-18 DIAGNOSIS — C719 Malignant neoplasm of brain, unspecified: Secondary | ICD-10-CM

## 2020-06-18 MED ORDER — BUTALBITAL-APAP-CAFFEINE 50-325-40 MG PO TABS: 1.0000 | ORAL_TABLET | Freq: Three times a day (TID) | ORAL | 0 refills | Status: DC | PRN

## 2020-06-18 MED ORDER — HYDROCODONE-ACETAMINOPHEN 10-325 MG PO TABS: 1.0000 | ORAL_TABLET | Freq: Three times a day (TID) | ORAL | 0 refills | Status: DC | PRN

## 2020-06-18 MED FILL — BUTALB-ACETAMIN-CAFF 50-325: 50-325-40 | 30 days supply | Qty: 90 | Fill #0

## 2020-06-20 DIAGNOSIS — B0052 Herpesviral keratitis: Secondary | ICD-10-CM | POA: Diagnosis not present

## 2020-06-22 DIAGNOSIS — S60221A Contusion of right hand, initial encounter: Secondary | ICD-10-CM | POA: Diagnosis not present

## 2020-06-22 DIAGNOSIS — S60222A Contusion of left hand, initial encounter: Secondary | ICD-10-CM | POA: Diagnosis not present

## 2020-06-22 DIAGNOSIS — S5002XA Contusion of left elbow, initial encounter: Secondary | ICD-10-CM | POA: Diagnosis not present

## 2020-06-22 MED FILL — HYDROCODON-APAP 10-325: 10-325 | 30 days supply | Qty: 90 | Fill #0

## 2020-07-07 ENCOUNTER — Other Ambulatory Visit: Payer: Self-pay | Admitting: Neurology

## 2020-07-07 DIAGNOSIS — G40109 Localization-related (focal) (partial) symptomatic epilepsy and epileptic syndromes with simple partial seizures, not intractable, without status epilepticus: Secondary | ICD-10-CM

## 2020-07-07 MED FILL — GABAPENTIN 300 MG CAPSULE: 300 | 30 days supply | Qty: 240 | Fill #0

## 2020-07-13 DIAGNOSIS — M25552 Pain in left hip: Secondary | ICD-10-CM | POA: Diagnosis not present

## 2020-07-13 DIAGNOSIS — M545 Low back pain, unspecified: Secondary | ICD-10-CM | POA: Diagnosis not present

## 2020-07-15 DIAGNOSIS — E039 Hypothyroidism, unspecified: Secondary | ICD-10-CM | POA: Diagnosis not present

## 2020-07-15 DIAGNOSIS — H16239 Neurotrophic keratoconjunctivitis, unspecified eye: Secondary | ICD-10-CM | POA: Diagnosis not present

## 2020-07-16 ENCOUNTER — Other Ambulatory Visit: Payer: Self-pay | Admitting: Adult Health

## 2020-07-16 ENCOUNTER — Other Ambulatory Visit: Payer: Self-pay | Admitting: Hematology and Oncology

## 2020-07-16 DIAGNOSIS — C719 Malignant neoplasm of brain, unspecified: Secondary | ICD-10-CM

## 2020-07-16 DIAGNOSIS — H1840 Unspecified corneal degeneration: Secondary | ICD-10-CM | POA: Diagnosis not present

## 2020-07-16 DIAGNOSIS — H16232 Neurotrophic keratoconjunctivitis, left eye: Secondary | ICD-10-CM | POA: Diagnosis not present

## 2020-07-16 MED ORDER — LORAZEPAM 0.5 MG PO TABS: 0.5000 mg | ORAL_TABLET | Freq: Three times a day (TID) | ORAL | 0 refills | Status: DC | PRN

## 2020-07-16 MED ORDER — HYDROCODONE-ACETAMINOPHEN 10-325 MG PO TABS: 1.0000 | ORAL_TABLET | Freq: Three times a day (TID) | ORAL | 0 refills | Status: DC | PRN

## 2020-07-16 MED ORDER — BUTALBITAL-APAP-CAFFEINE 50-325-40 MG PO TABS: 1.0000 | ORAL_TABLET | Freq: Three times a day (TID) | ORAL | 0 refills | Status: DC | PRN

## 2020-07-16 NOTE — Telephone Encounter (Signed)
Refill Request.  

## 2020-07-17 ENCOUNTER — Other Ambulatory Visit: Payer: Self-pay | Admitting: Hematology and Oncology

## 2020-07-17 DIAGNOSIS — R202 Paresthesia of skin: Secondary | ICD-10-CM

## 2020-07-17 DIAGNOSIS — R0789 Other chest pain: Secondary | ICD-10-CM

## 2020-07-17 DIAGNOSIS — C711 Malignant neoplasm of frontal lobe: Secondary | ICD-10-CM

## 2020-07-17 DIAGNOSIS — R4702 Dysphasia: Secondary | ICD-10-CM

## 2020-07-17 DIAGNOSIS — R110 Nausea: Secondary | ICD-10-CM

## 2020-07-17 DIAGNOSIS — R519 Headache, unspecified: Secondary | ICD-10-CM

## 2020-07-17 MED FILL — LORazepam 0.5 MG TABS: 0.5 | 30 days supply | Qty: 90 | Fill #0

## 2020-07-17 MED FILL — BUTALB-ACETAMIN-CAFF 50-325: 50-325-40 | 30 days supply | Qty: 90 | Fill #0

## 2020-07-20 MED FILL — HYDROCODON-APAP 10-325: 10-325 | 30 days supply | Qty: 90 | Fill #0

## 2020-07-20 NOTE — Telephone Encounter (Signed)
Victoria Bates, I cannot refuse this but

## 2020-07-20 NOTE — Telephone Encounter (Signed)
Dr. Bertis Ruddy, Mardella Layman already refilled this.  Could you refuse? Shawna Orleans

## 2020-07-21 ENCOUNTER — Other Ambulatory Visit: Payer: Self-pay | Admitting: Hematology and Oncology

## 2020-07-23 DIAGNOSIS — H1840 Unspecified corneal degeneration: Secondary | ICD-10-CM | POA: Diagnosis not present

## 2020-07-23 DIAGNOSIS — H16232 Neurotrophic keratoconjunctivitis, left eye: Secondary | ICD-10-CM | POA: Diagnosis not present

## 2020-07-23 MED FILL — CARISOPRODOL 350 MG TABS: 350 | 90 days supply | Qty: 360 | Fill #0

## 2020-07-29 DIAGNOSIS — Z9221 Personal history of antineoplastic chemotherapy: Secondary | ICD-10-CM | POA: Diagnosis not present

## 2020-07-29 DIAGNOSIS — Z7982 Long term (current) use of aspirin: Secondary | ICD-10-CM | POA: Diagnosis not present

## 2020-07-29 DIAGNOSIS — G43909 Migraine, unspecified, not intractable, without status migrainosus: Secondary | ICD-10-CM | POA: Diagnosis not present

## 2020-07-29 DIAGNOSIS — R569 Unspecified convulsions: Secondary | ICD-10-CM | POA: Diagnosis not present

## 2020-07-29 DIAGNOSIS — Z79899 Other long term (current) drug therapy: Secondary | ICD-10-CM | POA: Diagnosis not present

## 2020-07-29 DIAGNOSIS — R519 Headache, unspecified: Secondary | ICD-10-CM | POA: Diagnosis not present

## 2020-07-29 DIAGNOSIS — C711 Malignant neoplasm of frontal lobe: Secondary | ICD-10-CM | POA: Diagnosis not present

## 2020-07-29 DIAGNOSIS — H16239 Neurotrophic keratoconjunctivitis, unspecified eye: Secondary | ICD-10-CM | POA: Diagnosis not present

## 2020-07-29 DIAGNOSIS — Z9889 Other specified postprocedural states: Secondary | ICD-10-CM | POA: Diagnosis not present

## 2020-07-30 ENCOUNTER — Other Ambulatory Visit (HOSPITAL_COMMUNITY): Payer: Self-pay | Admitting: Ophthalmology

## 2020-07-30 MED FILL — VALACYCLOVIR HCL 500 MG TAB: 500 | 30 days supply | Qty: 60 | Fill #0

## 2020-08-05 ENCOUNTER — Other Ambulatory Visit: Payer: Self-pay | Admitting: Neurology

## 2020-08-05 DIAGNOSIS — G40109 Localization-related (focal) (partial) symptomatic epilepsy and epileptic syndromes with simple partial seizures, not intractable, without status epilepticus: Secondary | ICD-10-CM

## 2020-08-06 ENCOUNTER — Telehealth: Payer: Self-pay | Admitting: Neurology

## 2020-08-06 ENCOUNTER — Other Ambulatory Visit: Payer: Self-pay | Admitting: Neurology

## 2020-08-06 MED FILL — GABAPENTIN 300 MG CAPSULE: 300 | 30 days supply | Qty: 240 | Fill #0

## 2020-08-06 NOTE — Telephone Encounter (Signed)
Called and spoke to patient. She stated that Dr. Clovis Riley has not taken over her medications. Patient stated that Dr. Clovis Riley had only suggested the dose of her Depakote be adjusted to 750 mg, but patient could not tolerate dose and it had to be brought back down. Patient also stated that [facility]. Advised patient that I would send Dr. Delice Lesch a message and give her a call back as soon as I hear back.

## 2020-08-06 NOTE — Telephone Encounter (Signed)
Ok to send refills until her [facility] appt in 03/2021, thanks

## 2020-08-06 NOTE — Telephone Encounter (Signed)
Can you pls check with patient, on our last visit she said she will be having Dr. Clovis Riley at [facility] take over her medications and will be seeing a headache specialist at [facility] as well. Thanks

## 2020-08-12 ENCOUNTER — Telehealth: Payer: Self-pay | Admitting: Hematology and Oncology

## 2020-08-12 ENCOUNTER — Other Ambulatory Visit: Payer: Self-pay | Admitting: Adult Health

## 2020-08-12 ENCOUNTER — Other Ambulatory Visit: Payer: Self-pay | Admitting: Hematology and Oncology

## 2020-08-12 DIAGNOSIS — C719 Malignant neoplasm of brain, unspecified: Secondary | ICD-10-CM

## 2020-08-12 MED ORDER — BUTALBITAL-APAP-CAFFEINE 50-325-40 MG PO TABS: 1.0000 | ORAL_TABLET | Freq: Three times a day (TID) | ORAL | 0 refills | Status: DC | PRN

## 2020-08-12 MED ORDER — HYDROCODONE-ACETAMINOPHEN 10-325 MG PO TABS: 1.0000 | ORAL_TABLET | Freq: Three times a day (TID) | ORAL | 0 refills | Status: DC | PRN

## 2020-08-12 NOTE — Telephone Encounter (Signed)
Scheduled follow-up appointment per 1/26 schedule message. Patient is aware. °

## 2020-08-13 MED FILL — BUTALB-ACETAMIN-CAFF 50-325: 50-325-40 | 30 days supply | Qty: 90 | Fill #0

## 2020-08-17 MED FILL — HYDROCODON-APAP 10-325: 10-325 | 30 days supply | Qty: 90 | Fill #0

## 2020-08-21 DIAGNOSIS — H179 Unspecified corneal scar and opacity: Secondary | ICD-10-CM | POA: Diagnosis not present

## 2020-08-28 ENCOUNTER — Ambulatory Visit: Payer: Medicare Other | Admitting: Neurology

## 2020-09-01 MED FILL — GABAPENTIN 300 MG CAPSULE: 300 | 30 days supply | Qty: 240 | Fill #1

## 2020-09-09 ENCOUNTER — Other Ambulatory Visit: Payer: Self-pay | Admitting: Hematology and Oncology

## 2020-09-09 ENCOUNTER — Other Ambulatory Visit: Payer: Self-pay

## 2020-09-09 DIAGNOSIS — C719 Malignant neoplasm of brain, unspecified: Secondary | ICD-10-CM

## 2020-09-09 MED ORDER — BUTALBITAL-APAP-CAFFEINE 50-325-40 MG PO TABS: 1.0000 | ORAL_TABLET | Freq: Three times a day (TID) | ORAL | 0 refills | Status: DC | PRN

## 2020-09-09 MED ORDER — NURTEC 75 MG PO TBDP: 1.0000 | ORAL_TABLET | ORAL | 0 refills | Status: DC | PRN

## 2020-09-09 MED ORDER — HYDROCODONE-ACETAMINOPHEN 10-325 MG PO TABS: 1.0000 | ORAL_TABLET | Freq: Three times a day (TID) | ORAL | 0 refills | Status: DC | PRN

## 2020-09-09 MED FILL — BUTALB-ACETAMIN-CAFF 50-325: 50-325-40 | 30 days supply | Qty: 90 | Fill #0

## 2020-09-09 MED FILL — NURTEC 75 MG TBDP: 75 | 20 days supply | Qty: 8 | Fill #0

## 2020-09-14 MED FILL — DIVALPROEX SOD ER 250 MG TA: 250 | 30 days supply | Qty: 60 | Fill #1

## 2020-09-14 MED FILL — LEVOCETIRIZINE 5 MG TABLET: 5 | 30 days supply | Qty: 30 | Fill #3

## 2020-09-15 ENCOUNTER — Other Ambulatory Visit: Payer: Self-pay

## 2020-09-15 ENCOUNTER — Inpatient Hospital Stay: Payer: Medicare Other | Attending: Hematology and Oncology | Admitting: Hematology and Oncology

## 2020-09-15 DIAGNOSIS — Z888 Allergy status to other drugs, medicaments and biological substances status: Secondary | ICD-10-CM | POA: Diagnosis not present

## 2020-09-15 DIAGNOSIS — R030 Elevated blood-pressure reading, without diagnosis of hypertension: Secondary | ICD-10-CM | POA: Diagnosis not present

## 2020-09-15 DIAGNOSIS — R4189 Other symptoms and signs involving cognitive functions and awareness: Secondary | ICD-10-CM | POA: Insufficient documentation

## 2020-09-15 DIAGNOSIS — Z885 Allergy status to narcotic agent status: Secondary | ICD-10-CM | POA: Insufficient documentation

## 2020-09-15 DIAGNOSIS — C711 Malignant neoplasm of frontal lobe: Secondary | ICD-10-CM | POA: Diagnosis not present

## 2020-09-15 DIAGNOSIS — G40109 Localization-related (focal) (partial) symptomatic epilepsy and epileptic syndromes with simple partial seizures, not intractable, without status epilepticus: Secondary | ICD-10-CM

## 2020-09-15 DIAGNOSIS — G44021 Chronic cluster headache, intractable: Secondary | ICD-10-CM | POA: Diagnosis not present

## 2020-09-15 DIAGNOSIS — Z79899 Other long term (current) drug therapy: Secondary | ICD-10-CM | POA: Diagnosis not present

## 2020-09-15 DIAGNOSIS — Z881 Allergy status to other antibiotic agents status: Secondary | ICD-10-CM | POA: Insufficient documentation

## 2020-09-15 MED FILL — HYDROCODON-APAP 10-325: 10-325 | 30 days supply | Qty: 90 | Fill #0

## 2020-09-16 ENCOUNTER — Encounter: Payer: Self-pay | Admitting: Hematology and Oncology

## 2020-09-16 NOTE — Progress Notes (Signed)
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 Since the last time I saw her almost a year ago, she had numerous imaging studies and follow-up at [facility] Initially, she was under the impression she has progression of disease and would require repeat resection but further evaluation by multiple experts concluded that her disease is stable and the risk of surgery outweighs the benefit The patient is at loss She is asking me to direct her care and at this point in time, I told the patient I would defer to the expertise of her neuro oncologist at [facility] to determine the next step  Elevated BP without diagnosis of hypertension She continues to have intermittent elevated blood pressure, likely aggravated by pain and anxiety I do not plan to start her on medication for this  Simple partial seizures She continues to have frequent seizures She told me nobody is managing her antiseizure medications I recommend she consult with her neurologist for further management From my standpoint, I recommend slow taper of carisoprodol over time as I am not comfortable prescribing this medication which I do not believe is helping her seizure  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   All questions were answered. The patient knows to call the clinic with any problems, questions or concerns. The total time spent in the appointment was 30 minutes encounter with patients including review of chart and various tests results, discussions about plan of care and coordination of care plan   Heath Lark, MD 09/16/2020 9:55 AM  INTERVAL HISTORY: Please see below for problem oriented charting. She returns with her husband for further follow-up I have reviewed numerous outside records The thing that bother her most right now is inconclusive plan regarding whether she should go for surgical resection or not, management of her seizure and difficulties with her vision Both the patient and her husband felt that nobody is directing her care For example, her physician at [facility] recently changed the dose of Depakote and then her local neurologist refused to manage her seizure medication When she tried to call for clarification of medication and refills, nobody would return her call She continues to have chronic intermittent seizures and chronic headaches Recently, she have visual changes and have seen multiple eye specialist for this  SUMMARY OF ONCOLOGIC HISTORY: Oncology History  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   09/27/2013 Pathology Results   Accession: [id number] pathology showed grade 2 oligodendroglioma with mutant IDH1 protein, ATRX, OLIG 2 expression. p53 is negative, low  Ki-67 labeling index. Molecular SNP array studies demonstrate whole arm 1p19q codeletion."   10/28/2013 Imaging   Ct scan of head showed possible  abscess.   11/06/2013 Imaging   Repeat CT scan of the head showed resolution of abscess.   01/23/2014 Surgery   Dr. Christella Noa perform a subtotal gross resection of the tumor.   01/24/2014 Imaging   Repeat imaging studies showed persistent and residual disease.   03/03/2014 - 09/01/2014 Chemotherapy   She was started on Temodar, 5 days every 28 days   03/13/2014 Imaging   Repeat MRI showed no evidence of progression of disease.   07/14/2014 Imaging   Repeat MRI showed no evidence of disease progression.   09/12/2014 Imaging   MRI of the liver show mild regression of the size of lesions, presumed to be benign   09/15/2014 Imaging   Repeat MRI of the brain show persistent abnormalities, suspicious for residual disease   03/02/2015 Imaging    MRI brain at [facility] show persistent abnormalities   08/10/2015 Imaging   Repeat MRI [facility] showed stable disease   08/24/2015 - 02/08/2016 Chemotherapy   She was started on Lomustine, Procarbazine and Vincristine chemotherapy   08/30/2015 Imaging   CT chest showed no evidence of aspiration pneumonia   11/18/2015 Imaging   MRI brain showed status post resection of right frontal lobe tumor without residual or recurrent tumor. 2. Otherwise negative MRI of the brain.    03/02/2016 Imaging   Stable MRI post right frontal lobe tumor resection. Negative for recurrent tumor   06/01/2016 Imaging   Stable MRI post right frontal lobe tumor resection. No recurrent tumor identified   08/19/2016 Imaging   Unchanged examination without tumor recurrence.   11/17/2016 Imaging   Stable right frontal resection cavity. Stable focal mass effect within the right middle frontal gyrus at the superior margin of the cavity which may represent nonenhancing neoplasm. No evidence for tumor progression. Otherwise stable MRI of the brain   12/16/2016 Imaging   MR brain: 1. No acute intracranial abnormality. 2. Unchanged appearance of right frontal lobe resection cavity and surrounding  hyperintense T2 weighted signal.   02/04/2017 Imaging   Repeat MRI Normal pituitary  Stable postop changes of right frontal tumor resection   05/12/2017 Imaging   The left ventricular size is normal. There is normal left ventricular wall thickness.  Left ventricular systolic function is normal. LV ejection fraction = 60-65%.  Left ventricular filling pattern is normal. The left ventricular wall motion is normal. The right ventricle is normal in size and function. There is no significant valvular stenosis or regurgitation No pulmonary hypertension. IVC size was normal. There is no pericardial effusion. There is no comparison study available   05/13/2017 Imaging   1.No evidence of acute pulmonary thromboembolic disease. 2.There is an 8 mm right lower lobe groundglass nodule. Statistically, in this age group, this is most likely to reflect benign etiologies such as a tiny focus of infection or nonspecific inflammation. 4 mm solid right upper lobe lung nodule. A CT scan could be obtained in 12 months to further evaluate if the patient has any known risk factors for malignancy. 3.There is a 1.3 cm left adrenal nodule. This finding is incompletely characterized although and is slightly increased in size relative to the abdominal MRI from February 2016. Although this finding is most likely benign, recommend follow-up adrenal protocol CT in one year.   05/13/2017 Imaging   MRI brain 1.No acute intracranial abnormality. 2.Postsurgical changes of a  right frontal craniotomy for resection of a reported oligodendroglioma. Hyperintense T2/FLAIR signal around the resection cavity and in the subcortical white matter of a gyrus along the superior margin of the cavity is unchanged from 01/05/2015. No evidence of disease progression.   12/15/2017 Imaging   MRI brain at [facility] 1.No acute intracranial abnormality. 2.Postsurgical changes of right frontal craniotomy for resection of  reported oligodendroglioma. No evidence of disease progression   12/28/2017 Procedure   Successful right IJ vein Port-A-Cath explant.   04/24/2018 Imaging   Stable post treatment appearance, RIGHT frontal oligodendroglioma.  No posttraumatic sequelae are evident.   01/30/2019 Imaging   Stable appearance of treated right frontal oligodendroglioma. No change from 04/24/2018.   11/18/2019 Imaging   1. Stable postoperative changes of the right frontal lobe without evidence for residual or recurrent disease. 2. No acute intracranial abnormality or significant interval change.     REVIEW OF SYSTEMS:   Constitutional: Denies fevers, chills or abnormal weight loss Eyes: Denies blurriness of vision Ears, nose, mouth, throat, and face: Denies mucositis or sore throat Respiratory: Denies cough, dyspnea or wheezes Cardiovascular: Denies palpitation, chest discomfort or lower extremity swelling Gastrointestinal:  Denies nausea, heartburn or change in bowel habits Skin: Denies abnormal skin rashes Lymphatics: Denies new lymphadenopathy or easy bruising Neurological:Denies numbness, tingling or new weaknesses Behavioral/Psych: Mood is stable, no new changes  All other systems were reviewed with the patient and are negative.  I have reviewed the past medical history, past surgical history, social history and family history with the patient and they are unchanged from previous note.  ALLERGIES:  is allergic to adhesive [tape], latex, medical adhesive remover, percocet [oxycodone-acetaminophen], prednisone, shellfish allergy, soma [carisoprodol], tramadol hcl, vancomycin, and zofran [ondansetron hcl].  MEDICATIONS:  Current Outpatient Medications  Medication Sig Dispense Refill  . divalproex (DEPAKOTE ER) 500 MG 24 hr tablet Take 750 mg by mouth daily. Take 2 tabs twice a day     . butalbital-acetaminophen-caffeine (FIORICET) 50-325-40 MG tablet Take 1 tablet by mouth 3 (three) times daily as needed  for headache. 90 tablet 0  . carisoprodol (SOMA) 350 MG tablet TAKE 1 TABLET BY MOUTH FOUR TIMES DAILY 360 tablet 1  . EPINEPHRINE 0.3 mg/0.3 mL IJ SOAJ injection INJECT 0.3 MLS (ONE SYRINGE) INTO THE MUSCLE ONCE FOR 1 DOSE AS NEEDED. (Patient taking differently: Inject 0.3 mg into the muscle once. ) 0.3 mL 0  . ergocalciferol (VITAMIN D2) 1.25 MG (50000 UT) capsule Take 1 capsule (50,000 Units total) by mouth once a week. 12 capsule 3  . gabapentin (NEURONTIN) 300 MG capsule TAKE 4 CAPSULES BY MOUTH TWICE A DAY 240 capsule 9  . HYDROcodone-acetaminophen (NORCO) 10-325 MG tablet Take 1 tablet by mouth 3 (three) times daily as needed. 90 tablet 0  . levocetirizine (XYZAL) 5 MG tablet TAKE 1 TABLET (5 MG TOTAL) BY MOUTH EVERY EVENING. 30 tablet 5  . LORazepam (ATIVAN) 0.5 MG tablet Take 1 tablet (0.5 mg total) by mouth every 8 (eight) hours as needed for anxiety or seizure. 90 tablet 0  . montelukast (SINGULAIR) 10 MG tablet Take 1 tablet (10 mg total) by mouth at bedtime. 30 tablet 9  . Rimegepant Sulfate (NURTEC) 75 MG TBDP Take 1 tablet by mouth as needed (Take 1 tablet as needed for migraine. Do not take more than 2-3 times a week). 10 tablet 0  . zolmitriptan (ZOMIG) 5 MG tablet Take 1 tablet as needed at onset of migraine. Do not take more than  2-3 a week 10 tablet 0   No current facility-administered medications for this visit.    PHYSICAL EXAMINATION: ECOG PERFORMANCE STATUS: 2 - Symptomatic, <50% confined to bed  Vitals:   09/15/20 1215  BP: (!) 178/94  Pulse: 91  Resp: 18  Temp: 98.4 F (36.9 C)  SpO2: 100%   Filed Weights   09/15/20 1215  Weight: 154 lb 9.6 oz (70.1 kg)    GENERAL:alert, no distress and comfortable Musculoskeletal:no cyanosis of digits and no clubbing  NEURO: alert & oriented x 3 with fluent speech, no focal motor/sensory deficits  LABORATORY DATA:  I have reviewed the data as listed    Component Value Date/Time   NA 141 02/24/2020 1018   NA 140  08/07/2019 0000   NA 140 06/15/2017 1207   K 3.9 02/24/2020 1018   K 3.9 06/15/2017 1207   CL 105 02/24/2020 1018   CO2 28 02/24/2020 1018   CO2 24 06/15/2017 1207   GLUCOSE 87 02/24/2020 1018   GLUCOSE 85 06/15/2017 1207   BUN 16 02/24/2020 1018   BUN 12 08/07/2019 0000   BUN 5.8 (L) 06/15/2017 1207   CREATININE 0.76 02/24/2020 1018   CREATININE 0.7 06/15/2017 1207   CALCIUM 9.6 02/24/2020 1018   CALCIUM 8.5 06/15/2017 1207   PROT 7.7 02/24/2020 1018   PROT 6.7 06/15/2017 1207   ALBUMIN 4.1 02/24/2020 1018   ALBUMIN 3.9 06/15/2017 1207   AST 17 02/24/2020 1018   AST 14 06/15/2017 1207   ALT 15 02/24/2020 1018   ALT 11 06/15/2017 1207   ALKPHOS 45 02/24/2020 1018   ALKPHOS 42 06/15/2017 1207   BILITOT 0.5 02/24/2020 1018   BILITOT 0.36 06/15/2017 1207   GFRNONAA >60 02/24/2020 1018   GFRAA >60 02/24/2020 1018    No results found for: SPEP, UPEP  Lab Results  Component Value Date   WBC 3.1 (L) 02/24/2020   NEUTROABS 1.6 (L) 02/24/2020   HGB 13.0 02/24/2020   HCT 38.7 02/24/2020   MCV 97.0 02/24/2020   PLT 248 02/24/2020      Chemistry      Component Value Date/Time   NA 141 02/24/2020 1018   NA 140 08/07/2019 0000   NA 140 06/15/2017 1207   K 3.9 02/24/2020 1018   K 3.9 06/15/2017 1207   CL 105 02/24/2020 1018   CO2 28 02/24/2020 1018   CO2 24 06/15/2017 1207   BUN 16 02/24/2020 1018   BUN 12 08/07/2019 0000   BUN 5.8 (L) 06/15/2017 1207   CREATININE 0.76 02/24/2020 1018   CREATININE 0.7 06/15/2017 1207      Component Value Date/Time   CALCIUM 9.6 02/24/2020 1018   CALCIUM 8.5 06/15/2017 1207   ALKPHOS 45 02/24/2020 1018   ALKPHOS 42 06/15/2017 1207   AST 17 02/24/2020 1018   AST 14 06/15/2017 1207   ALT 15 02/24/2020 1018   ALT 11 06/15/2017 1207   BILITOT 0.5 02/24/2020 1018   BILITOT 0.36 06/15/2017 1207

## 2020-09-16 NOTE — Assessment & Plan Note (Signed)
She continues to have frequent seizures She told me nobody is managing her antiseizure medications I recommend she consult with her neurologist for further management From my standpoint, I recommend slow taper of carisoprodol over time as I am not comfortable prescribing this medication which I do not believe is helping her seizure

## 2020-09-16 NOTE — Assessment & Plan Note (Signed)
She continues to have intermittent elevated blood pressure, likely aggravated by pain and anxiety I do not plan to start her on medication for this

## 2020-09-16 NOTE — Assessment & Plan Note (Signed)
Since the last time I saw her almost a year ago, she had numerous imaging studies and follow-up at [facility] Initially, she was under the impression she has progression of disease and would require repeat resection but further evaluation by multiple experts concluded that her disease is stable and the risk of surgery outweighs the benefit The patient is at loss She is asking me to direct her care and at this point in time, I told the patient I would defer to the expertise of her neuro oncologist at [facility] to determine the next step

## 2020-09-16 NOTE — Assessment & Plan Note (Signed)
The patient has chronic and complex pain syndrome with intractable headaches and is dependent on pain medicine For now, I do not plan to taper hydrocodone or Fioricet but my next step would be to get her off Soma over the next year

## 2020-09-16 NOTE — Assessment & Plan Note (Signed)
She has cognitive impairment and poor memory I recommend her husband to attend all future visits

## 2020-09-17 ENCOUNTER — Other Ambulatory Visit (HOSPITAL_COMMUNITY): Payer: Self-pay | Admitting: Physician Assistant

## 2020-09-22 MED FILL — DIVALPROEX SOD ER 250 MG TA: 250 | 30 days supply | Qty: 180 | Fill #0

## 2020-09-25 MED FILL — GABAPENTIN 300 MG CAPSULE: 300 | 30 days supply | Qty: 240 | Fill #2

## 2020-09-29 DIAGNOSIS — H179 Unspecified corneal scar and opacity: Secondary | ICD-10-CM | POA: Diagnosis not present

## 2020-10-02 ENCOUNTER — Other Ambulatory Visit: Payer: Self-pay

## 2020-10-02 ENCOUNTER — Other Ambulatory Visit: Payer: Self-pay | Admitting: Adult Health

## 2020-10-02 ENCOUNTER — Other Ambulatory Visit: Payer: Self-pay | Admitting: Hematology and Oncology

## 2020-10-02 MED ORDER — BUTALBITAL-APAP-CAFFEINE 50-325-40 MG PO TABS: 1.0000 | ORAL_TABLET | Freq: Three times a day (TID) | ORAL | 0 refills | Status: DC | PRN

## 2020-10-02 MED ORDER — LORAZEPAM 0.5 MG PO TABS: 0.5000 mg | ORAL_TABLET | Freq: Three times a day (TID) | ORAL | 0 refills | Status: DC | PRN

## 2020-10-09 ENCOUNTER — Other Ambulatory Visit: Payer: Self-pay | Admitting: Hematology and Oncology

## 2020-10-09 DIAGNOSIS — C719 Malignant neoplasm of brain, unspecified: Secondary | ICD-10-CM

## 2020-10-12 ENCOUNTER — Other Ambulatory Visit: Payer: Self-pay

## 2020-10-12 ENCOUNTER — Other Ambulatory Visit: Payer: Self-pay | Admitting: Hematology and Oncology

## 2020-10-12 DIAGNOSIS — R4702 Dysphasia: Secondary | ICD-10-CM

## 2020-10-12 DIAGNOSIS — R202 Paresthesia of skin: Secondary | ICD-10-CM

## 2020-10-12 DIAGNOSIS — R110 Nausea: Secondary | ICD-10-CM

## 2020-10-12 DIAGNOSIS — C711 Malignant neoplasm of frontal lobe: Secondary | ICD-10-CM

## 2020-10-12 DIAGNOSIS — R0789 Other chest pain: Secondary | ICD-10-CM

## 2020-10-12 DIAGNOSIS — R519 Headache, unspecified: Secondary | ICD-10-CM

## 2020-10-12 MED ORDER — HYDROCODONE-ACETAMINOPHEN 10-325 MG PO TABS: 1.0000 | ORAL_TABLET | Freq: Three times a day (TID) | ORAL | 0 refills | Status: DC | PRN

## 2020-10-12 MED ORDER — CARISOPRODOL 350 MG PO TABS: 350.0000 mg | ORAL_TABLET | Freq: Three times a day (TID) | ORAL | 0 refills | Status: DC

## 2020-10-12 MED FILL — HYDROCODON-APAP 10-325: 10-325 | 30 days supply | Qty: 90 | Fill #0

## 2020-10-13 ENCOUNTER — Encounter: Payer: Self-pay | Admitting: Neurology

## 2020-10-14 ENCOUNTER — Other Ambulatory Visit: Payer: Self-pay | Admitting: Neurology

## 2020-10-14 ENCOUNTER — Other Ambulatory Visit: Payer: Self-pay

## 2020-10-14 MED ORDER — NURTEC 75 MG PO TBDP: 1.0000 | ORAL_TABLET | ORAL | 2 refills | Status: DC | PRN

## 2020-10-14 MED FILL — NURTEC 75 MG TBDP: 75 | 30 days supply | Qty: 8 | Fill #0

## 2020-10-19 ENCOUNTER — Other Ambulatory Visit (HOSPITAL_COMMUNITY): Payer: Self-pay

## 2020-10-19 ENCOUNTER — Other Ambulatory Visit (HOSPITAL_BASED_OUTPATIENT_CLINIC_OR_DEPARTMENT_OTHER): Payer: Self-pay

## 2020-10-19 MED ORDER — CARISOPRODOL 350 MG PO TABS
350.0000 mg | ORAL_TABLET | Freq: Four times a day (QID) | ORAL | 0 refills | Status: DC
  Filled 2020-10-19: qty 360, 90d supply, fill #0

## 2020-10-20 ENCOUNTER — Telehealth: Payer: Self-pay

## 2020-10-20 NOTE — Telephone Encounter (Signed)
Spoke with pt she is taken  Depakote 750 mg BID she is going to ask the neuro oncology in September if he is going to start refilling it because he is making the changes, but will you please refill for now, Gabapentin 300mg  4 tabs (1200mg ) BID  Nurtec 75mg  as needed Zomig 5 mg as needed  Asking for a 3 month supply of medication. She is not driving Asking to be added to the cancellation list for a sooner appointment with you. Asking about Nurtec as a preventative? Would that be something good for her? If she did do nurtec as a preventative and had a bad breakthrough what would she take/use? Wanted to make sure that you knew what happened to her last time she had Botox. 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

## 2020-10-21 ENCOUNTER — Other Ambulatory Visit (HOSPITAL_COMMUNITY): Payer: Self-pay

## 2020-10-21 ENCOUNTER — Other Ambulatory Visit: Payer: Self-pay | Admitting: Neurology

## 2020-10-21 MED ORDER — EMGALITY 120 MG/ML ~~LOC~~ SOAJ
1.0000 "pen " | SUBCUTANEOUS | 11 refills | Status: DC
  Filled 2020-10-21: qty 1, 30d supply, fill #0
  Filled 2020-12-23: qty 1, 30d supply, fill #1
  Filled 2021-01-14: qty 1, 30d supply, fill #2
  Filled 2021-03-01: qty 1, 30d supply, fill #3

## 2020-10-21 MED ORDER — ZOLMITRIPTAN 5 MG PO TABS
ORAL_TABLET | ORAL | 11 refills | Status: DC
  Filled 2020-10-21: qty 10, 30d supply, fill #0
  Filled 2020-12-23 – 2021-01-14 (×2): qty 10, 30d supply, fill #1
  Filled 2021-03-01: qty 10, 30d supply, fill #2
  Filled 2021-05-24: qty 10, 30d supply, fill #3
  Filled 2021-07-21: qty 10, 30d supply, fill #4

## 2020-10-21 NOTE — Telephone Encounter (Signed)
Nurtec is in a family of medications that come as monthly injections that she self-injects. These injections have been helpful for a lot of our migraine patients. I think she would benefit from this more, and have Nurtec for rescue. If she is agreeable, can come in for first dose of Emgality. Thanks

## 2020-10-21 NOTE — Addendum Note (Signed)
Addended by: Cameron Sprang on: 10/21/2020 02:38 PM   Modules accepted: Orders

## 2020-10-21 NOTE — Telephone Encounter (Signed)
Rx sent to [facility], look out for PA if needed, thanks

## 2020-10-21 NOTE — Telephone Encounter (Signed)
Pt called and informed that Nurtec is in a family of medications that come as monthly injections that she self-injects. These injections have been helpful for a lot of our migraine patients. Dr Delice Lesch thinks you would benefit from this more, and have Nurtec for rescue  Pt would like to try the emgailty asking if we can order it to make sure that her insurance will cover it,

## 2020-10-26 ENCOUNTER — Other Ambulatory Visit (HOSPITAL_COMMUNITY): Payer: Self-pay

## 2020-10-26 MED FILL — Gabapentin Cap 300 MG: ORAL | 30 days supply | Qty: 240 | Fill #0 | Status: AC

## 2020-10-28 ENCOUNTER — Encounter: Payer: Self-pay | Admitting: Neurology

## 2020-10-28 NOTE — Progress Notes (Signed)
Received approval for Emgality valid from 10/21/20 to 01/26/21. Sent approval letter to chart.

## 2020-10-29 ENCOUNTER — Other Ambulatory Visit (HOSPITAL_COMMUNITY): Payer: Self-pay

## 2020-10-29 ENCOUNTER — Other Ambulatory Visit: Payer: Self-pay | Admitting: Hematology and Oncology

## 2020-10-29 MED ORDER — BUTALBITAL-APAP-CAFFEINE 50-325-40 MG PO TABS
ORAL_TABLET | ORAL | 0 refills | Status: DC
  Filled 2020-10-29: qty 90, 30d supply, fill #0

## 2020-10-29 MED FILL — Valacyclovir HCl Tab 500 MG: ORAL | 30 days supply | Qty: 60 | Fill #0 | Status: AC

## 2020-10-29 MED FILL — Levocetirizine Dihydrochloride Tab 5 MG: ORAL | 30 days supply | Qty: 30 | Fill #0 | Status: AC

## 2020-10-29 MED FILL — Divalproex Sodium Tab ER 24 HR 250 MG: ORAL | 30 days supply | Qty: 180 | Fill #0 | Status: AC

## 2020-10-30 ENCOUNTER — Other Ambulatory Visit (HOSPITAL_COMMUNITY): Payer: Self-pay

## 2020-11-06 ENCOUNTER — Other Ambulatory Visit: Payer: Self-pay | Admitting: Hematology and Oncology

## 2020-11-06 ENCOUNTER — Other Ambulatory Visit: Payer: Self-pay | Admitting: Allergy and Immunology

## 2020-11-06 DIAGNOSIS — C719 Malignant neoplasm of brain, unspecified: Secondary | ICD-10-CM

## 2020-11-06 NOTE — Telephone Encounter (Signed)
Dr. Alvy Bimler,  For your approval or refusal. Gardiner Rhyme, RN

## 2020-11-09 ENCOUNTER — Other Ambulatory Visit (HOSPITAL_COMMUNITY): Payer: Self-pay

## 2020-11-09 MED ORDER — HYDROCODONE-ACETAMINOPHEN 10-325 MG PO TABS
1.0000 | ORAL_TABLET | Freq: Three times a day (TID) | ORAL | 0 refills | Status: DC | PRN
  Filled 2020-11-09: qty 90, 30d supply, fill #0

## 2020-11-18 DIAGNOSIS — C711 Malignant neoplasm of frontal lobe: Secondary | ICD-10-CM | POA: Diagnosis not present

## 2020-11-24 ENCOUNTER — Other Ambulatory Visit: Payer: Self-pay | Admitting: Hematology and Oncology

## 2020-11-24 ENCOUNTER — Other Ambulatory Visit (HOSPITAL_COMMUNITY): Payer: Self-pay

## 2020-11-24 MED ORDER — LORAZEPAM 0.5 MG PO TABS
ORAL_TABLET | ORAL | 0 refills | Status: DC
  Filled 2020-11-24: qty 90, 30d supply, fill #0

## 2020-11-24 MED ORDER — BUTALBITAL-APAP-CAFFEINE 50-325-40 MG PO TABS
ORAL_TABLET | ORAL | 0 refills | Status: DC
Start: 2020-11-24 — End: 2020-12-23
  Filled 2020-11-24 – 2020-11-28 (×2): qty 90, 30d supply, fill #0

## 2020-11-24 MED FILL — Divalproex Sodium Tab ER 24 HR 250 MG: ORAL | 30 days supply | Qty: 180 | Fill #1 | Status: AC

## 2020-11-24 MED FILL — Rimegepant Sulfate Tab Disint 75 MG: ORAL | 30 days supply | Qty: 10 | Fill #0 | Status: AC

## 2020-11-24 MED FILL — Gabapentin Cap 300 MG: ORAL | 30 days supply | Qty: 240 | Fill #1 | Status: AC

## 2020-11-28 ENCOUNTER — Other Ambulatory Visit (HOSPITAL_COMMUNITY): Payer: Self-pay

## 2020-11-28 ENCOUNTER — Other Ambulatory Visit: Payer: Self-pay | Admitting: Hematology and Oncology

## 2020-11-28 DIAGNOSIS — T50905D Adverse effect of unspecified drugs, medicaments and biological substances, subsequent encounter: Secondary | ICD-10-CM

## 2020-11-30 ENCOUNTER — Other Ambulatory Visit (HOSPITAL_COMMUNITY): Payer: Self-pay

## 2020-11-30 DIAGNOSIS — J40 Bronchitis, not specified as acute or chronic: Secondary | ICD-10-CM | POA: Diagnosis not present

## 2020-11-30 DIAGNOSIS — Z20822 Contact with and (suspected) exposure to covid-19: Secondary | ICD-10-CM | POA: Diagnosis not present

## 2020-11-30 DIAGNOSIS — R059 Cough, unspecified: Secondary | ICD-10-CM | POA: Diagnosis not present

## 2020-11-30 DIAGNOSIS — R0981 Nasal congestion: Secondary | ICD-10-CM | POA: Diagnosis not present

## 2020-11-30 MED ORDER — PROAIR RESPICLICK 108 (90 BASE) MCG/ACT IN AEPB
INHALATION_SPRAY | RESPIRATORY_TRACT | 0 refills | Status: DC
  Filled 2020-11-30: qty 1, 25d supply, fill #0

## 2020-11-30 MED ORDER — AMOXICILLIN-POT CLAVULANATE 875-125 MG PO TABS
ORAL_TABLET | ORAL | 0 refills | Status: DC
  Filled 2020-11-30: qty 10, 5d supply, fill #0

## 2020-11-30 MED ORDER — LEVOCETIRIZINE DIHYDROCHLORIDE 5 MG PO TABS
ORAL_TABLET | Freq: Every evening | ORAL | 0 refills | Status: DC
  Filled 2020-11-30: qty 30, 30d supply, fill #0

## 2020-12-01 ENCOUNTER — Other Ambulatory Visit (HOSPITAL_COMMUNITY): Payer: Self-pay

## 2020-12-01 MED ORDER — ALBUTEROL SULFATE HFA 108 (90 BASE) MCG/ACT IN AERS
INHALATION_SPRAY | RESPIRATORY_TRACT | 0 refills | Status: DC
  Filled 2020-12-01: qty 18, 16d supply, fill #0

## 2020-12-04 ENCOUNTER — Other Ambulatory Visit (HOSPITAL_COMMUNITY): Payer: Self-pay

## 2020-12-04 ENCOUNTER — Other Ambulatory Visit: Payer: Self-pay | Admitting: Hematology and Oncology

## 2020-12-04 DIAGNOSIS — C719 Malignant neoplasm of brain, unspecified: Secondary | ICD-10-CM

## 2020-12-04 MED ORDER — HYDROCODONE-ACETAMINOPHEN 10-325 MG PO TABS
1.0000 | ORAL_TABLET | Freq: Three times a day (TID) | ORAL | 0 refills | Status: DC | PRN
  Filled 2020-12-04 – 2020-12-07 (×2): qty 90, 30d supply, fill #0

## 2020-12-07 ENCOUNTER — Other Ambulatory Visit (HOSPITAL_COMMUNITY): Payer: Self-pay

## 2020-12-23 ENCOUNTER — Other Ambulatory Visit: Payer: Self-pay | Admitting: Hematology and Oncology

## 2020-12-23 DIAGNOSIS — T50905D Adverse effect of unspecified drugs, medicaments and biological substances, subsequent encounter: Secondary | ICD-10-CM

## 2020-12-23 MED FILL — Rimegepant Sulfate Tab Disint 75 MG: ORAL | 30 days supply | Qty: 10 | Fill #1 | Status: AC

## 2020-12-23 MED FILL — Divalproex Sodium Tab ER 24 HR 250 MG: ORAL | 30 days supply | Qty: 180 | Fill #2 | Status: AC

## 2020-12-23 MED FILL — Gabapentin Cap 300 MG: ORAL | 30 days supply | Qty: 240 | Fill #2 | Status: AC

## 2020-12-24 ENCOUNTER — Other Ambulatory Visit (HOSPITAL_COMMUNITY): Payer: Self-pay

## 2020-12-24 MED ORDER — BUTALBITAL-APAP-CAFFEINE 50-325-40 MG PO TABS
ORAL_TABLET | ORAL | 0 refills | Status: DC
  Filled 2020-12-24: qty 90, 30d supply, fill #0

## 2020-12-24 MED ORDER — LEVOCETIRIZINE DIHYDROCHLORIDE 5 MG PO TABS
ORAL_TABLET | Freq: Every evening | ORAL | 0 refills | Status: DC
  Filled 2020-12-24: qty 30, 30d supply, fill #0

## 2020-12-25 ENCOUNTER — Other Ambulatory Visit (HOSPITAL_COMMUNITY): Payer: Self-pay

## 2020-12-28 ENCOUNTER — Other Ambulatory Visit (HOSPITAL_COMMUNITY): Payer: Self-pay

## 2021-01-01 ENCOUNTER — Other Ambulatory Visit: Payer: Self-pay | Admitting: Hematology and Oncology

## 2021-01-01 DIAGNOSIS — C719 Malignant neoplasm of brain, unspecified: Secondary | ICD-10-CM

## 2021-01-04 ENCOUNTER — Other Ambulatory Visit: Payer: Self-pay | Admitting: Hematology and Oncology

## 2021-01-04 ENCOUNTER — Other Ambulatory Visit (HOSPITAL_COMMUNITY): Payer: Self-pay

## 2021-01-04 DIAGNOSIS — R110 Nausea: Secondary | ICD-10-CM

## 2021-01-04 DIAGNOSIS — R519 Headache, unspecified: Secondary | ICD-10-CM

## 2021-01-04 DIAGNOSIS — C711 Malignant neoplasm of frontal lobe: Secondary | ICD-10-CM

## 2021-01-04 DIAGNOSIS — R4702 Dysphasia: Secondary | ICD-10-CM

## 2021-01-04 DIAGNOSIS — R0789 Other chest pain: Secondary | ICD-10-CM

## 2021-01-04 DIAGNOSIS — R202 Paresthesia of skin: Secondary | ICD-10-CM

## 2021-01-04 MED ORDER — HYDROCODONE-ACETAMINOPHEN 10-325 MG PO TABS
1.0000 | ORAL_TABLET | Freq: Three times a day (TID) | ORAL | 0 refills | Status: DC | PRN
  Filled 2021-01-04: qty 90, 30d supply, fill #0

## 2021-01-04 MED ORDER — LORAZEPAM 0.5 MG PO TABS
ORAL_TABLET | ORAL | 0 refills | Status: DC
  Filled 2021-01-04: qty 90, 30d supply, fill #0

## 2021-01-05 ENCOUNTER — Other Ambulatory Visit: Payer: Self-pay

## 2021-01-05 ENCOUNTER — Telehealth: Payer: Self-pay

## 2021-01-05 ENCOUNTER — Other Ambulatory Visit (HOSPITAL_COMMUNITY): Payer: Self-pay

## 2021-01-05 ENCOUNTER — Other Ambulatory Visit: Payer: Self-pay | Admitting: Hematology and Oncology

## 2021-01-05 DIAGNOSIS — R110 Nausea: Secondary | ICD-10-CM

## 2021-01-05 DIAGNOSIS — R202 Paresthesia of skin: Secondary | ICD-10-CM

## 2021-01-05 DIAGNOSIS — R519 Headache, unspecified: Secondary | ICD-10-CM

## 2021-01-05 DIAGNOSIS — R4702 Dysphasia: Secondary | ICD-10-CM

## 2021-01-05 DIAGNOSIS — C711 Malignant neoplasm of frontal lobe: Secondary | ICD-10-CM

## 2021-01-05 DIAGNOSIS — R0789 Other chest pain: Secondary | ICD-10-CM

## 2021-01-05 MED ORDER — CARISOPRODOL 350 MG PO TABS: 350.0000 mg | ORAL_TABLET | Freq: Two times a day (BID) | ORAL | 0 refills | Status: DC

## 2021-01-05 MED ORDER — CARISOPRODOL 350 MG PO TABS
350.0000 mg | ORAL_TABLET | Freq: Three times a day (TID) | ORAL | 0 refills | Status: DC
  Filled 2021-01-05: qty 270, 90d supply, fill #0

## 2021-01-05 NOTE — Telephone Encounter (Signed)
She has been taking the Soma 4 x a day.

## 2021-01-05 NOTE — Telephone Encounter (Signed)
Called and given below message. She is concerned about reducing the Soma so quickly to BID. She is currently taking it QID.

## 2021-01-05 NOTE — Telephone Encounter (Signed)
Called and given below message. She verbalized understanding. 

## 2021-01-05 NOTE — Telephone Encounter (Signed)
-----   Message from Heath Lark, MD sent at 01/05/2021  8:06 AM EDT ----- Per previious discussion, I recommended Soma taper When pharmacy send me refill request, I changed it to BID Please make sure she is aware and not take more than is prescribed

## 2021-01-05 NOTE — Telephone Encounter (Signed)
Called [facility]. Rx sent by Dr. Alvy Bimler for Soma 350 mg TID. Dr. Alvy Bimler is reducing Soma and the next refill she will reduce it to Soma 250 mg BID. [name] verbalized understanding. Offered monthly appt with Dr. Alvy Bimler and she will send refills at the office. [name] will call back after talking with Estill Bamberg.

## 2021-01-14 ENCOUNTER — Other Ambulatory Visit (HOSPITAL_COMMUNITY): Payer: Self-pay

## 2021-01-14 MED FILL — Rimegepant Sulfate Tab Disint 75 MG: ORAL | 30 days supply | Qty: 10 | Fill #2 | Status: CN

## 2021-01-15 ENCOUNTER — Other Ambulatory Visit (HOSPITAL_COMMUNITY): Payer: Self-pay

## 2021-01-19 ENCOUNTER — Other Ambulatory Visit: Payer: Self-pay | Admitting: Hematology and Oncology

## 2021-01-19 ENCOUNTER — Other Ambulatory Visit (HOSPITAL_COMMUNITY): Payer: Self-pay

## 2021-01-19 ENCOUNTER — Other Ambulatory Visit: Payer: Self-pay | Admitting: Neurology

## 2021-01-19 MED ORDER — BUTALBITAL-APAP-CAFFEINE 50-325-40 MG PO TABS
ORAL_TABLET | ORAL | 0 refills | Status: DC
  Filled 2021-01-19: qty 90, 22d supply, fill #0

## 2021-01-19 MED ORDER — BUTALBITAL-APAP-CAFFEINE 50-325-40 MG PO TABS: 1.0000 | ORAL_TABLET | Freq: Four times a day (QID) | ORAL | 0 refills | Status: DC | PRN

## 2021-01-19 MED ORDER — BUTALBITAL-APAP-CAFFEINE 50-325-40 MG PO TABS: ORAL_TABLET | ORAL | 0 refills | Status: DC

## 2021-01-19 MED ORDER — NURTEC 75 MG PO TBDP
ORAL_TABLET | ORAL | 2 refills | Status: DC
  Filled 2021-01-19: qty 8, 30d supply, fill #0
  Filled 2021-03-01: qty 8, 30d supply, fill #1
  Filled 2021-03-26: qty 8, 30d supply, fill #2

## 2021-01-19 MED FILL — Gabapentin Cap 300 MG: ORAL | 30 days supply | Qty: 240 | Fill #3 | Status: AC

## 2021-01-19 MED FILL — Rimegepant Sulfate Tab Disint 75 MG: ORAL | 30 days supply | Qty: 10 | Fill #2 | Status: CN

## 2021-01-19 MED FILL — Divalproex Sodium Tab ER 24 HR 250 MG: ORAL | 30 days supply | Qty: 180 | Fill #3 | Status: AC

## 2021-01-20 ENCOUNTER — Other Ambulatory Visit (HOSPITAL_COMMUNITY): Payer: Self-pay

## 2021-01-22 ENCOUNTER — Encounter: Payer: Self-pay | Admitting: Neurology

## 2021-01-22 ENCOUNTER — Telehealth (INDEPENDENT_AMBULATORY_CARE_PROVIDER_SITE_OTHER): Payer: Medicare Other | Admitting: Neurology

## 2021-01-22 ENCOUNTER — Other Ambulatory Visit: Payer: Self-pay

## 2021-01-22 VITALS — Ht 68.0 in | Wt 154.2 lb

## 2021-01-22 DIAGNOSIS — G43719 Chronic migraine without aura, intractable, without status migrainosus: Secondary | ICD-10-CM

## 2021-01-22 DIAGNOSIS — G40109 Localization-related (focal) (partial) symptomatic epilepsy and epileptic syndromes with simple partial seizures, not intractable, without status epilepticus: Secondary | ICD-10-CM | POA: Diagnosis not present

## 2021-01-22 DIAGNOSIS — C719 Malignant neoplasm of brain, unspecified: Secondary | ICD-10-CM | POA: Diagnosis not present

## 2021-01-22 DIAGNOSIS — R1310 Dysphagia, unspecified: Secondary | ICD-10-CM | POA: Diagnosis not present

## 2021-01-22 DIAGNOSIS — R4789 Other speech disturbances: Secondary | ICD-10-CM

## 2021-01-22 NOTE — Patient Instructions (Signed)
Good to see you today.  Start Topamax 25mg : take 1 tablet every night. Send me an update in a month on how you are feeling, if no side effects, we will plan to increase dose as tolerated  2. An order for a swallow evaluation and speech therapy will be sent  3. Take the Gabapentin 400mg : 3 caps in AM, 2 caps at noon, 3 caps at bedtime  4. Take the Depakote 250mg : 2 tabs three times a day  5. Start Emgality and give it a chance over the next 6 months to help with migraines  6. Continue with plans to slowly wean off the Soma, would also minimize Ativan/lorazepam intake as much as possible  7. Follow-up in 6 months, call for any changes.   Seizure Precautions: 1. If medication has been prescribed for you to prevent seizures, take it exactly as directed.  Do not stop taking the medicine without talking to your doctor first, even if you have not had a seizure in a long time.   2. Avoid activities in which a seizure would cause danger to yourself or to others.  Don't operate dangerous machinery, swim alone, or climb in high or dangerous places, such as on ladders, roofs, or girders.  Do not drive unless your doctor says you may.  3. If you have any warning that you may have a seizure, lay down in a safe place where you can't hurt yourself.    4.  No driving for 6 months from last seizure, as per [facility].   Please refer to the following link on the [facility] website for more information: http://[email or web address].org/answerplace/Social/driving/drivingu.cfm   5.  Maintain good sleep hygiene. Avoid alcohol.  6.  Notify your neurology if you are planning pregnancy or if you become pregnant.  7.  Contact your doctor if you have any problems that may be related to the medicine you are taking.  8.  Call 911 and bring the patient back to the ED if:        A.  The seizure lasts longer than 5 minutes.       B.  The patient doesn't awaken shortly after the  seizure  C.  The patient has new problems such as difficulty seeing, speaking or moving  D.  The patient was injured during the seizure  E.  The patient has a temperature over 102 F (39C)  F.  The patient vomited and now is having trouble breathing

## 2021-01-22 NOTE — Progress Notes (Addendum)
Virtual Visit via Video Note The purpose of this virtual visit is to provide medical care while limiting exposure to the novel coronavirus.    Consent was obtained for video visit:  Yes.   Answered questions that patient had about telehealth interaction:  Yes.   I discussed the limitations, risks, security and privacy concerns of performing an evaluation and management service by telemedicine. I also discussed with the patient that there may be a patient responsible charge related to this service. The patient expressed understanding and agreed to proceed.  Pt location: Home Physician Location: office Name of referring provider:  Lavone Orn, MD I connected with Victoria Bates at patients initiation/request on 01/22/2021 at  3:00 PM EDT by video enabled telemedicine application and verified that I am speaking with the correct person using two identifiers. Pt MRN:  [id number] Pt DOB:  June 01, 1981 Video Participants:  Victoria Bates;     History of Present Illness:  The patient had a virtual video visit on 01/22/2021. She was last seen in the neurology clinic a year ago. She started seeing her neurosurgeon Dr. Salomon Fick more regularly, as well as Neuro-oncologist Dr. Clovis Riley at [facility]. She states that in 11/2019 her neurosurgeons were very adamant she have surgery and radiation, however on her visit with Dr. Clovis Riley in 02/2020, it was noted that imaging actually showed minimal interval growth or change in the past year but looking back to 2018 and 2017 shows increased bulk of T2/FLAIR adjacent to the lesion in the right frontal lobe, recommendation was surveillance imaging and re-assessing. She had another ambulatory 72-EEG at [facility] done 02/14/20 to 02/17/20 with baseline EEG showing breach rhythm over the right frontocentral region, no epileptiform discharges. There were numerous events and push buttons for headaches, dizziness, jerking jaw, shakes, tremors, jerk in speech, word loss, vision/depth  perception changes, with no EEG correlate seen. Her last brain MRI with and without contrast done 11/18/2020 showed similar post-treatment changes s/p right frontal lobe oligodendroglioma debulking, no evidence of recurrent or progressive tumor.  She states that because of COVID and other things, shortage of staff, it has been difficult to get responses. She waited and reports that Dr. Clovis Riley wanted to get her in a study, which she does not want, "at this point, I'm just done." She states "quality is best, it is growing slowly so I am super thankful and enjoying my family." She states she is able to function at her current level and medication regimen. She has a hard time telling what are seizures. Family tells her she goes blank, last episode was last month. She is on Gabapentin 300mg  4 tabs BID and Depakote 250mg  3 tabs BID. When she was on a higher dose (1000mg  BID), it knocked her out. She has been on Soma for a very long time and can tell a difference in her speech with lowering of Soma dose. When she forgets her medication, she is more shaky. She also feels it works on the right side of her face, and especially for her speech and tremors in her hands. It has been 2 weeks since dose was lowered from 4 times a day to 3 times a day. She is really noticing it with her face. She states she has needed to take the lorazepam more in the past week due to the facial symptoms and tremors. She states she was previously only taking it as needed, but now the past week has been taking it 2-3 times a day.  She describes symptoms as more of a jerk on the right side of her face, a jerk in her jaw and tongue, and tremors in her hand. She feels it on both sides of her jaw, R>L, things get stuck when swallowing. She notices a jerk in her speech, worse in the past 2 weeks. She feels small pieces of food and pills get stuck on the right side of her throat. She had a swallow evaluation in 2016 indicating mild pharyngeal phase  dysphagia. She also reports word-finding difficulties, she sees the first letter of the word but cannot find it. She continues to report frequent headaches. She had 3 Botox sessions and felt it helped, however stopped because she was concerned it caused her vision problems. 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 She has not started [employer].When she has a headache, she would take Fioricet, then hydrocodone, then Nurtec. She may take a second dose of Nurtec. She takes Zomig which 90% of the time kicks it but has difficulty with side effects.    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 She was admitted to [facility] last 09/19/2013 due to left vision changes and left facial heaviness. She reports that she had a 60-lb unintentional weight loss 4 months leading up to her admission, she was vomiting after every meal. She started having episodes where her hands would have low amplitude tremors and she feels the right lower face twitching. She would need to pause and take a minute to get words out. If she did not stop what she was doing, the symptoms could last for hours. She would feel like her body was "going 90 mph" with palpitations up to 200 bpm. She was started on Xanax for anxiety, but this did not help. She then started having vision changes on her left eye, she would park crookedly when she thought she was going straight. She saw an ophthalmologist, eye exam was normal but left optic neuritis was questioned and brain imaging recommended. She then started to have left facial heaviness and numbness on the left face, arm, and leg, and drove herself to [facility] where she was found to have a right  frontal lesion. She underwent subtotal resection on 01/23/14 by Dr. Christella Noa and was started on Keppra for seizure prophylaxis at that time. She continued to have the recurrent episodes of tremors in both arms, palpitations, and right facial tightness, as well as episodes of left face, neck, shoulder, and arm numbness, going down the left thumb and index finger. She describes her major seizures as "all of that times 100." After the episodes, she would be tired for a few days, wanting to sleep, with no energy. She has noticed that her short term memory is "gone." Keppra dose had been increased up to 2 grams daily, and this had caused a change in personality. She had seen neurologist Dr. Leonie Man, who switched her to Depakote. She tapered off Keppra and is currently on Depakote ER 500mg  BID with significant improvement in symptoms. She denies any side effects to the Depakote.    She has a history of recurrent headaches since her teenage years. Headaches are usually over the  right temporal and occipital region, with sharp stabbing and throbbing pain, with a knot in her left neck region. There is occasional nausea, photo, and phonophobia. She was having these headaches around 1-2 times a week when younger, however since December 2014, headaches have been occurring around 2-3 times a week. She had been receiving injections in her neck for this. If she takes medication at the onset, they last a few hours, however they may last up to several days. She has found that taking Decadron for a couple of days has been the only helpful medication. She takes Fioricet rarely. Zomig, aspirin, and Tylenol were ineffective in the past. Her father and 40 year old son have headaches. She denies any diplopia, dysarthria, dysphagia, bowel/bladder dysfunction. She has chronic back pain and had been taking Soma qid for several years, with worsening of pain when she tried reducing this. She was started on Gabapentin for the seizures and back  pain, she is unsure if this helps. She had been taking Ultram for the pain, and once stopping this, she noticed an improvement in the seizures as well.     Diagnostic Data: Routine EEG done at [facility] reported amplitude asymmetry in the right frontotemporal regions which is likely from breach artifact from prior craniotomy. There is intermittent right frontal sharp activity but no definite epileptiform activity. Impression: Mild right hemispheric focal cortical irritability but no definitive epileptiform activity is noted.    Her 48-hour EEG did not show any epileptiform discharges. It showed breach artifact consistent with prior surgery. She reported numerous symptoms including headache, severe muscle spasm under the base of her skull on the right side, tremors in both hands, jerking and pause in jaw (right side), left leg/inner thigh and left foot warm and pigmented, eyes twitching, fatigue, with no associated epileptiform correlate seen. Inpatient video EEG monitoring for 3 days at [facility], June 23 to 25, 2016: Depakote and Gabapentin were held. Typical events were not clearly captured, however she reported 2 spells where she reported dizziness and visual changes (tiles on floor looked uneven) with no EEG correlate. Baseline EEG showed breach rhythm over the right hemisphere, no epileptiform discharges or electrographic seizures.  I personally reviewed MRI brain with and without contrast done 03/13/14 which showed post-op right-sided craniotomy. The surgical cavity in the right frontal lobe is filled with fluid, with note of improvement in layering blood products since prior study. The study of 01/24/2014 showed restricted diffusion deep to the tumor. This is consistent with acute infarct which is resolving at this time. No acute infarct is present on today's study. No evidence of recurrent or residual tumor although the original tumor did not enhance. No mass effect or edema is present that would suggest  recurrent tumor. Small amount of subarachnoid hemorrhage on the right again noted. MRI 11/2015 which I personally reviewed, there is similar pattern of enhancement and abnormal FLAIR signal in the right frontal gyrus immediately superior to the resection, stable from previous scan.     Epilepsy Risk Factors:  Right frontal oligodendroglioma s/p subtotal resection. Otherwise she had a normal birth and early development.  There is no history of febrile convulsions, CNS infections such as meningitis/encephalitis, or family history of seizures.  Prior medications: Topamax ("I can't take that", made her very tired)   Current Outpatient Medications on File Prior to Visit  Medication Sig Dispense Refill   albuterol (VENTOLIN HFA) 108 (90 Base) MCG/ACT inhaler Inhale 2 puffs into the lungs every 4 (four) hours as  needed for up to 30 days for Shortness of Breath. 18 g 0   aspirin EC 81 MG tablet Take 81 mg by mouth as needed. Swallow whole.     butalbital-acetaminophen-caffeine (FIORICET) 50-325-40 MG tablet Take 1 tablet by mouth every 6 (six) hours as needed for headache. 90 tablet 0   carisoprodol (SOMA) 350 MG tablet Take 1 tablet (350 mg total) by mouth 3 (three) times daily. 270 tablet 0   Cholecalciferol (D3 PO) Take by mouth.     divalproex (DEPAKOTE ER) 250 MG 24 hr tablet TAKE 3 TABLETS BY MOUTH IN THE MORNING AND AT BEDTIME 180 tablet 5   EPINEPHRINE 0.3 mg/0.3 mL IJ SOAJ injection INJECT 0.3 MLS (ONE SYRINGE) INTO THE MUSCLE ONCE FOR 1 DOSE AS NEEDED. (Patient taking differently: Inject 0.3 mg into the muscle once.) 0.3 mL 0   gabapentin (NEURONTIN) 300 MG capsule TAKE 4 CAPSULES BY MOUTH TWICE A DAY 240 capsule 9   Galcanezumab-gnlm (EMGALITY) 120 MG/ML SOAJ Inject 1 pen into the skin every 30 (thirty) days. 1.12 mL 11   HYDROcodone-acetaminophen (NORCO) 10-325 MG tablet TAKE 1 TABLET BY MOUTH 3 TIMES DAILY AS NEEDED 90 tablet 0   levocetirizine (XYZAL) 5 MG tablet TAKE 1 TABLET BY MOUTH EVERY  EVENING. 30 tablet 0   LORazepam (ATIVAN) 0.5 MG tablet TAKE 1 TABLET BY MOUTH EVERY 8 HOURS AS NEEDED FOR ANXIETY OR SEIZURE 90 tablet 0   montelukast (SINGULAIR) 10 MG tablet Take 1 tablet (10 mg total) by mouth at bedtime. 30 tablet 9   Rimegepant Sulfate (NURTEC) 75 MG TBDP DISSOLVE 1 TABLET AS NEEDED FOR MIGRAINE. DO NOT TAKE MORE THAN 2-3 TIMES A WEEK. 8 tablet 2   zolmitriptan (ZOMIG) 5 MG tablet Take 1 tablet as needed at onset of migraine. Do not take more than 2-3 a week 10 tablet 11   No current facility-administered medications on file prior to visit.     Observations/Objective:   Vitals:   01/22/21 1425  Weight: 154 lb 3.2 oz (69.9 kg)  Height: 5\' 8"  (1.727 m)   GEN:  The patient appears stated age and is in NAD.  Neurological examination: Patient is awake, alert. No aphasia. Speech was initially clear at the beginning of the visit, as the visit progressed and she reported her facial symptoms, some words were more slurred. Intact fluency and comprehension. Cranial nerves: Extraocular movements intacts. No facial asymmetry. Motor: moves all extremities symmetrically, at least anti-gravity x 4.   Assessment and Plan:   This is a 40 yo RH woman with a history of grade 2 oligodengroglioma s/p subtotal resection, anxiety, chronic migraine, with recurrent episodes of low amplitude tremors, palpitations, and right facial tightening, as well as left face and arm numbness. She has had several prolonged EEGs which have not shown any seizure activity, most recently at [facility] with a 72-hour EEG in 01/2020 showing breach rhythm over the right frontocentral region, no epileptiform discharges, numerous events and push buttons for headaches, dizziness, jerking jaw, shakes, tremors, jerk in speech, word loss, vision/depth perception changes, with no EEG correlate seen. Most recent Brain MRI in 11/2020 showed similar post-treatment changes s/p right frontal lobe oligodendroglioma debulking, no  evidence of recurrent or progressive tumor. She stopped Botox due to concern it contributed to her eye problems (unlikely), we discussed re-trial of Topiramate 25mg  qhs for migraine and seizure prophylaxis. She was advised to start the Emgality. She will try spreading out her medications to TID dosing, Gabapentin 300mg   3 tabs in AM, 2 tabs at noon, 3 tabs in PM, and Depakote 250mg  2 tabs TID to see if this helps her better. She states she knows that Manuela Neptune is off label for seizures but that it has helped with her facial symptoms and tremors. I discussed with her that repeated EEGs have not shown that these symptoms are seizure-related, and would continue with plan to slowly wean her off the [facility]. Would also try to minimize Ativan use. Discussed how Topiramate is also helpful for tremors. She reports worsening swallowing and speech changes, she will be referred to speech therapy and will have repeat swallow evaluation done. She will update our office in a month on how she is feeling on Topiramate, we will uptitrate as tolerated. Discussed [place] driving laws, no driving until 6 months seizure-free. Follow-up in 6 months, call for any changes.   Follow Up Instructions:    -I discussed the assessment and treatment plan with the patient. The patient was provided an opportunity to ask questions and all were answered. The patient agreed with the plan and demonstrated an understanding of the instructions.   The patient was advised to call back or seek an in-person evaluation if the symptoms worsen or if the condition fails to improve as anticipated.    Cameron Sprang, MD

## 2021-01-28 ENCOUNTER — Other Ambulatory Visit: Payer: Self-pay | Admitting: Hematology and Oncology

## 2021-01-28 ENCOUNTER — Other Ambulatory Visit (HOSPITAL_COMMUNITY): Payer: Self-pay

## 2021-01-28 DIAGNOSIS — T50905D Adverse effect of unspecified drugs, medicaments and biological substances, subsequent encounter: Secondary | ICD-10-CM

## 2021-01-28 DIAGNOSIS — C719 Malignant neoplasm of brain, unspecified: Secondary | ICD-10-CM

## 2021-01-28 MED ORDER — LEVOCETIRIZINE DIHYDROCHLORIDE 5 MG PO TABS
ORAL_TABLET | Freq: Every evening | ORAL | 0 refills | Status: DC
  Filled 2021-01-28: qty 30, 30d supply, fill #0

## 2021-01-28 MED ORDER — HYDROCODONE-ACETAMINOPHEN 10-325 MG PO TABS
1.0000 | ORAL_TABLET | Freq: Three times a day (TID) | ORAL | 0 refills | Status: DC | PRN
Start: 2021-01-28 — End: 2021-03-01
  Filled 2021-01-28 – 2021-02-01 (×2): qty 90, 30d supply, fill #0

## 2021-02-01 ENCOUNTER — Other Ambulatory Visit (HOSPITAL_COMMUNITY): Payer: Self-pay

## 2021-02-09 ENCOUNTER — Other Ambulatory Visit: Payer: Self-pay

## 2021-02-09 ENCOUNTER — Other Ambulatory Visit (HOSPITAL_COMMUNITY): Payer: Self-pay

## 2021-02-09 DIAGNOSIS — R4702 Dysphasia: Secondary | ICD-10-CM

## 2021-02-09 DIAGNOSIS — R1310 Dysphagia, unspecified: Secondary | ICD-10-CM

## 2021-02-09 DIAGNOSIS — R4789 Other speech disturbances: Secondary | ICD-10-CM

## 2021-02-09 MED ORDER — GABAPENTIN 300 MG PO CAPS
ORAL_CAPSULE | ORAL | 11 refills | Status: DC
  Filled 2021-02-09: qty 240, fill #0
  Filled 2021-02-18: qty 240, 30d supply, fill #0
  Filled 2021-03-16: qty 240, 30d supply, fill #1
  Filled 2021-04-13: qty 240, 30d supply, fill #2
  Filled 2021-05-11: qty 240, 30d supply, fill #3
  Filled 2021-06-08: qty 240, 30d supply, fill #4
  Filled 2021-07-05: qty 240, 30d supply, fill #5
  Filled 2021-08-04: qty 240, 30d supply, fill #6
  Filled 2021-09-01: qty 240, 30d supply, fill #7
  Filled 2021-09-29: qty 240, 30d supply, fill #8

## 2021-02-09 MED ORDER — DIVALPROEX SODIUM ER 250 MG PO TB24
ORAL_TABLET | ORAL | 11 refills | Status: DC
  Filled 2021-02-09: qty 180, 30d supply, fill #0
  Filled 2021-03-16: qty 180, 30d supply, fill #1
  Filled 2021-06-16: qty 180, 30d supply, fill #2
  Filled 2021-07-05 – 2021-08-04 (×2): qty 180, 30d supply, fill #3

## 2021-02-09 MED ORDER — TOPIRAMATE 25 MG PO TABS
ORAL_TABLET | ORAL | 6 refills | Status: DC
  Filled 2021-02-09: qty 30, 30d supply, fill #0

## 2021-02-10 ENCOUNTER — Other Ambulatory Visit: Payer: Self-pay | Admitting: Hematology and Oncology

## 2021-02-10 ENCOUNTER — Other Ambulatory Visit (HOSPITAL_COMMUNITY): Payer: Self-pay

## 2021-02-11 ENCOUNTER — Other Ambulatory Visit (HOSPITAL_COMMUNITY): Payer: Self-pay

## 2021-02-11 ENCOUNTER — Encounter: Payer: Self-pay | Admitting: Hematology and Oncology

## 2021-02-11 MED ORDER — BUTALBITAL-APAP-CAFFEINE 50-325-40 MG PO TABS
ORAL_TABLET | ORAL | 0 refills | Status: DC
  Filled 2021-02-11: qty 90, 22d supply, fill #0

## 2021-02-12 ENCOUNTER — Telehealth (HOSPITAL_COMMUNITY): Payer: Self-pay

## 2021-02-12 ENCOUNTER — Telehealth: Payer: Self-pay

## 2021-02-12 NOTE — Telephone Encounter (Signed)
Called and left a message regarding mychart message. Instructed to call Dr. Delice Lesch directly and go to the ER for evaluation if still having a medication reaction. Ask her to call the office back for questions.

## 2021-02-12 NOTE — Telephone Encounter (Signed)
Attempted to contact patient to scheduled OP MBS - left voicemail.

## 2021-02-12 NOTE — Telephone Encounter (Signed)
I called patient and she isnt feeling well, she is going to go later to ER, if she doesn't feel better. Patient thanked me for calling and will call with report.

## 2021-02-18 ENCOUNTER — Other Ambulatory Visit (HOSPITAL_COMMUNITY): Payer: Self-pay

## 2021-02-24 ENCOUNTER — Other Ambulatory Visit (HOSPITAL_COMMUNITY): Payer: Self-pay

## 2021-02-24 ENCOUNTER — Other Ambulatory Visit: Payer: Self-pay | Admitting: Gastroenterology

## 2021-02-24 ENCOUNTER — Other Ambulatory Visit: Payer: Self-pay

## 2021-02-24 ENCOUNTER — Ambulatory Visit
Admission: RE | Admit: 2021-02-24 | Discharge: 2021-02-24 | Disposition: A | Discharge status: Home / Self Care | Payer: Medicare Other | Source: Ambulatory Visit | Attending: Gastroenterology | Admitting: Gastroenterology

## 2021-02-24 DIAGNOSIS — Z8719 Personal history of other diseases of the digestive system: Secondary | ICD-10-CM | POA: Diagnosis not present

## 2021-02-24 DIAGNOSIS — R10822 Left upper quadrant rebound abdominal tenderness: Secondary | ICD-10-CM | POA: Diagnosis not present

## 2021-02-24 DIAGNOSIS — R1010 Upper abdominal pain, unspecified: Secondary | ICD-10-CM | POA: Diagnosis not present

## 2021-02-24 DIAGNOSIS — K920 Hematemesis: Secondary | ICD-10-CM | POA: Diagnosis not present

## 2021-02-24 DIAGNOSIS — R109 Unspecified abdominal pain: Secondary | ICD-10-CM

## 2021-02-24 DIAGNOSIS — K921 Melena: Secondary | ICD-10-CM | POA: Diagnosis not present

## 2021-02-24 MED ORDER — HYOSCYAMINE SULFATE 0.125 MG PO TABS
ORAL_TABLET | ORAL | 0 refills | Status: DC
  Filled 2021-02-24: qty 120, 30d supply, fill #0

## 2021-02-24 MED ORDER — IOPAMIDOL (ISOVUE-300) INJECTION 61%
100.0000 mL | Freq: Once | INTRAVENOUS | Status: AC | PRN
  Administered 2021-02-24: 100 mL via INTRAVENOUS

## 2021-02-24 MED ORDER — PANTOPRAZOLE SODIUM 40 MG PO TBEC
DELAYED_RELEASE_TABLET | ORAL | 2 refills | Status: DC
  Filled 2021-02-24: qty 60, 30d supply, fill #0
  Filled 2021-04-01: qty 60, 30d supply, fill #1

## 2021-02-25 ENCOUNTER — Telehealth (HOSPITAL_COMMUNITY): Payer: Self-pay

## 2021-02-25 ENCOUNTER — Other Ambulatory Visit (HOSPITAL_COMMUNITY): Payer: Self-pay

## 2021-02-25 DIAGNOSIS — K920 Hematemesis: Secondary | ICD-10-CM | POA: Diagnosis not present

## 2021-02-25 DIAGNOSIS — K2930 Chronic superficial gastritis without bleeding: Secondary | ICD-10-CM | POA: Diagnosis not present

## 2021-02-25 DIAGNOSIS — K921 Melena: Secondary | ICD-10-CM | POA: Diagnosis not present

## 2021-02-25 DIAGNOSIS — R1010 Upper abdominal pain, unspecified: Secondary | ICD-10-CM | POA: Diagnosis not present

## 2021-02-25 MED ORDER — SUCRALFATE 1 G PO TABS
ORAL_TABLET | ORAL | 4 refills | Status: DC
  Filled 2021-02-25: qty 60, 30d supply, fill #0
  Filled 2021-04-01: qty 60, 30d supply, fill #1

## 2021-02-25 MED ORDER — PANTOPRAZOLE SODIUM 40 MG PO TBEC
DELAYED_RELEASE_TABLET | ORAL | 4 refills | Status: DC
  Filled 2021-02-25 – 2021-03-26 (×2): qty 180, 90d supply, fill #0

## 2021-02-25 NOTE — Telephone Encounter (Signed)
2nd attempt to contact patient to schedule OP MBS - left voicemail. ?

## 2021-03-01 ENCOUNTER — Other Ambulatory Visit: Payer: Self-pay | Admitting: Hematology and Oncology

## 2021-03-01 ENCOUNTER — Other Ambulatory Visit (HOSPITAL_COMMUNITY): Payer: Self-pay

## 2021-03-01 DIAGNOSIS — C719 Malignant neoplasm of brain, unspecified: Secondary | ICD-10-CM

## 2021-03-01 DIAGNOSIS — T50905D Adverse effect of unspecified drugs, medicaments and biological substances, subsequent encounter: Secondary | ICD-10-CM

## 2021-03-01 MED ORDER — LEVOCETIRIZINE DIHYDROCHLORIDE 5 MG PO TABS
ORAL_TABLET | Freq: Every evening | ORAL | 0 refills | Status: DC
Start: 2021-03-01 — End: 2021-04-02
  Filled 2021-03-01: qty 30, 30d supply, fill #0

## 2021-03-01 MED ORDER — HYDROCODONE-ACETAMINOPHEN 10-325 MG PO TABS
1.0000 | ORAL_TABLET | Freq: Three times a day (TID) | ORAL | 0 refills | Status: DC | PRN
  Filled 2021-03-01: qty 90, 30d supply, fill #0

## 2021-03-02 ENCOUNTER — Other Ambulatory Visit (HOSPITAL_COMMUNITY): Payer: Self-pay

## 2021-03-02 DIAGNOSIS — K2930 Chronic superficial gastritis without bleeding: Secondary | ICD-10-CM | POA: Diagnosis not present

## 2021-03-05 ENCOUNTER — Telehealth (HOSPITAL_COMMUNITY): Payer: Self-pay

## 2021-03-05 ENCOUNTER — Other Ambulatory Visit: Payer: Self-pay | Admitting: Hematology and Oncology

## 2021-03-05 ENCOUNTER — Other Ambulatory Visit (HOSPITAL_COMMUNITY): Payer: Self-pay

## 2021-03-05 MED ORDER — BUTALBITAL-APAP-CAFFEINE 50-325-40 MG PO TABS
ORAL_TABLET | ORAL | 0 refills | Status: DC
  Filled 2021-03-05: qty 90, 22d supply, fill #0

## 2021-03-05 NOTE — Telephone Encounter (Signed)
3rd attempt to contact patient to schedule OP MBS - vm is full. Will close order in 7 days if patient does not return phone call.

## 2021-03-12 ENCOUNTER — Other Ambulatory Visit (HOSPITAL_COMMUNITY): Payer: Self-pay

## 2021-03-15 ENCOUNTER — Telehealth: Payer: Self-pay

## 2021-03-15 NOTE — Telephone Encounter (Signed)
New message   Outcome  Approved today  Victoria Bates Key: [id number] - PA Case ID: [id number]  Need help? Call us at [phone]  Approved. This drug has been approved under the Member's Medicare Part D benefit.  Approved quantity: 1 units per 30 day(s). You may fill up to a 90 day supply except for those on Specialty Tier 5, which can be filled up to a 30 day supply. Please call the pharmacy to process the prescription claim.

## 2021-03-16 ENCOUNTER — Other Ambulatory Visit (HOSPITAL_COMMUNITY): Payer: Self-pay

## 2021-03-17 ENCOUNTER — Other Ambulatory Visit (HOSPITAL_COMMUNITY): Payer: Self-pay

## 2021-03-17 DIAGNOSIS — G43009 Migraine without aura, not intractable, without status migrainosus: Secondary | ICD-10-CM | POA: Diagnosis not present

## 2021-03-17 DIAGNOSIS — Z885 Allergy status to narcotic agent status: Secondary | ICD-10-CM | POA: Diagnosis not present

## 2021-03-17 DIAGNOSIS — Z881 Allergy status to other antibiotic agents status: Secondary | ICD-10-CM | POA: Diagnosis not present

## 2021-03-17 DIAGNOSIS — C711 Malignant neoplasm of frontal lobe: Secondary | ICD-10-CM | POA: Diagnosis not present

## 2021-03-17 DIAGNOSIS — Z91040 Latex allergy status: Secondary | ICD-10-CM | POA: Diagnosis not present

## 2021-03-17 DIAGNOSIS — Z08 Encounter for follow-up examination after completed treatment for malignant neoplasm: Secondary | ICD-10-CM | POA: Diagnosis not present

## 2021-03-17 DIAGNOSIS — Z85841 Personal history of malignant neoplasm of brain: Secondary | ICD-10-CM | POA: Diagnosis not present

## 2021-03-17 DIAGNOSIS — H547 Unspecified visual loss: Secondary | ICD-10-CM | POA: Diagnosis not present

## 2021-03-17 DIAGNOSIS — Z888 Allergy status to other drugs, medicaments and biological substances status: Secondary | ICD-10-CM | POA: Diagnosis not present

## 2021-03-17 DIAGNOSIS — G4089 Other seizures: Secondary | ICD-10-CM | POA: Diagnosis not present

## 2021-03-18 DIAGNOSIS — Z91040 Latex allergy status: Secondary | ICD-10-CM | POA: Diagnosis not present

## 2021-03-18 DIAGNOSIS — Z881 Allergy status to other antibiotic agents status: Secondary | ICD-10-CM | POA: Diagnosis not present

## 2021-03-18 DIAGNOSIS — Z85841 Personal history of malignant neoplasm of brain: Secondary | ICD-10-CM | POA: Diagnosis not present

## 2021-03-18 DIAGNOSIS — Z888 Allergy status to other drugs, medicaments and biological substances status: Secondary | ICD-10-CM | POA: Diagnosis not present

## 2021-03-18 DIAGNOSIS — Z886 Allergy status to analgesic agent status: Secondary | ICD-10-CM | POA: Diagnosis not present

## 2021-03-18 DIAGNOSIS — Z9221 Personal history of antineoplastic chemotherapy: Secondary | ICD-10-CM | POA: Diagnosis not present

## 2021-03-18 DIAGNOSIS — Z923 Personal history of irradiation: Secondary | ICD-10-CM | POA: Diagnosis not present

## 2021-03-18 DIAGNOSIS — Z885 Allergy status to narcotic agent status: Secondary | ICD-10-CM | POA: Diagnosis not present

## 2021-03-18 DIAGNOSIS — R519 Headache, unspecified: Secondary | ICD-10-CM | POA: Diagnosis not present

## 2021-03-18 DIAGNOSIS — G4440 Drug-induced headache, not elsewhere classified, not intractable: Secondary | ICD-10-CM | POA: Diagnosis not present

## 2021-03-19 ENCOUNTER — Other Ambulatory Visit (HOSPITAL_COMMUNITY): Payer: Self-pay

## 2021-03-19 MED ORDER — SUCRALFATE 1 G PO TABS
ORAL_TABLET | ORAL | 1 refills | Status: DC
  Filled 2021-03-19: qty 60, 30d supply, fill #0

## 2021-03-20 ENCOUNTER — Other Ambulatory Visit (HOSPITAL_COMMUNITY): Payer: Self-pay

## 2021-03-26 ENCOUNTER — Other Ambulatory Visit (HOSPITAL_COMMUNITY): Payer: Self-pay

## 2021-03-26 ENCOUNTER — Other Ambulatory Visit: Payer: Self-pay | Admitting: Hematology and Oncology

## 2021-03-26 ENCOUNTER — Encounter: Payer: Self-pay | Admitting: Hematology and Oncology

## 2021-03-26 DIAGNOSIS — C719 Malignant neoplasm of brain, unspecified: Secondary | ICD-10-CM

## 2021-03-26 MED ORDER — LORAZEPAM 0.5 MG PO TABS
ORAL_TABLET | ORAL | 0 refills | Status: DC
  Filled 2021-03-26: qty 90, 30d supply, fill #0

## 2021-03-26 MED ORDER — BUTALBITAL-APAP-CAFFEINE 50-325-40 MG PO TABS
ORAL_TABLET | ORAL | 0 refills | Status: DC
  Filled 2021-03-26: qty 90, 22d supply, fill #0

## 2021-03-31 ENCOUNTER — Other Ambulatory Visit: Payer: Self-pay | Admitting: Hematology and Oncology

## 2021-03-31 ENCOUNTER — Other Ambulatory Visit (HOSPITAL_COMMUNITY): Payer: Self-pay

## 2021-03-31 DIAGNOSIS — C719 Malignant neoplasm of brain, unspecified: Secondary | ICD-10-CM

## 2021-03-31 MED ORDER — HYDROCODONE-ACETAMINOPHEN 10-325 MG PO TABS
1.0000 | ORAL_TABLET | Freq: Three times a day (TID) | ORAL | 0 refills | Status: DC | PRN
  Filled 2021-03-31: qty 90, 30d supply, fill #0

## 2021-04-01 ENCOUNTER — Other Ambulatory Visit: Payer: Self-pay | Admitting: Hematology and Oncology

## 2021-04-01 DIAGNOSIS — R519 Headache, unspecified: Secondary | ICD-10-CM

## 2021-04-01 DIAGNOSIS — C711 Malignant neoplasm of frontal lobe: Secondary | ICD-10-CM

## 2021-04-01 DIAGNOSIS — R202 Paresthesia of skin: Secondary | ICD-10-CM

## 2021-04-01 DIAGNOSIS — R0789 Other chest pain: Secondary | ICD-10-CM

## 2021-04-01 DIAGNOSIS — T50905D Adverse effect of unspecified drugs, medicaments and biological substances, subsequent encounter: Secondary | ICD-10-CM

## 2021-04-01 DIAGNOSIS — R110 Nausea: Secondary | ICD-10-CM

## 2021-04-01 DIAGNOSIS — R4702 Dysphasia: Secondary | ICD-10-CM

## 2021-04-02 ENCOUNTER — Other Ambulatory Visit: Payer: Self-pay | Admitting: Hematology and Oncology

## 2021-04-02 ENCOUNTER — Other Ambulatory Visit (HOSPITAL_COMMUNITY): Payer: Self-pay

## 2021-04-02 ENCOUNTER — Telehealth: Payer: Self-pay

## 2021-04-02 MED ORDER — CARISOPRODOL 350 MG PO TABS
350.0000 mg | ORAL_TABLET | Freq: Three times a day (TID) | ORAL | 0 refills | Status: DC
  Filled 2021-04-02: qty 21, 7d supply, fill #0

## 2021-04-02 NOTE — Telephone Encounter (Signed)
Next Friday at 130 pm, 30 mins I will refill her Soma at TID dose for 7 days until I see her next week

## 2021-04-02 NOTE — Telephone Encounter (Signed)
Called and given below message. Appt scheduled. She verbalized understanding and agreed to appt date/time.

## 2021-04-02 NOTE — Telephone Encounter (Signed)
Called and given below message. She verbalized understanding. She is crying and very emotional. Halting speech and stating today is not a good day.  She has tried zyrtec and Claritin in the past for her allergies. She will try Allegra and if it does not help she will call the office back. She will stop the Zyzal.  She will take the reduced dose of Soma to BID. She is concerned about the reducing Soma.   She would like appt with Dr. Alvy Bimler and will bring [name] to appt. Ok to schedule next Friday? Currently no appts scheduled with you.

## 2021-04-02 NOTE — Telephone Encounter (Signed)
Called and left a message asking her to call the office back. 

## 2021-04-02 NOTE — Telephone Encounter (Signed)
Called and left a message for Victoria Bates to call the office.

## 2021-04-02 NOTE — Telephone Encounter (Signed)
Called and left another message asking her to call the office back.  

## 2021-04-02 NOTE — Telephone Encounter (Signed)
-----   Message from Heath Lark, MD sent at 04/02/2021  8:01 AM EDT ----- I received a request to refill xyzal and soma Per previous discussion in June, she had agreed that the next dose change would be BID I want to make sure she is aware before I refill it If she disagree, I will not refill it until we meet and discuss face to face  Also, Not sure why I keep refilling xyzal (medicine for allergy). Xyzal interacts with many medications, I suggest for her to just get cetirizine OTC or other OTC medications for allergies

## 2021-04-03 ENCOUNTER — Other Ambulatory Visit (HOSPITAL_COMMUNITY): Payer: Self-pay

## 2021-04-09 ENCOUNTER — Inpatient Hospital Stay: Payer: Medicare Other | Attending: Hematology and Oncology | Admitting: Hematology and Oncology

## 2021-04-09 ENCOUNTER — Other Ambulatory Visit: Payer: Self-pay

## 2021-04-09 ENCOUNTER — Encounter: Payer: Self-pay | Admitting: Hematology and Oncology

## 2021-04-09 ENCOUNTER — Other Ambulatory Visit (HOSPITAL_COMMUNITY): Payer: Self-pay

## 2021-04-09 DIAGNOSIS — Z888 Allergy status to other drugs, medicaments and biological substances status: Secondary | ICD-10-CM | POA: Diagnosis not present

## 2021-04-09 DIAGNOSIS — R251 Tremor, unspecified: Secondary | ICD-10-CM | POA: Diagnosis not present

## 2021-04-09 DIAGNOSIS — R519 Headache, unspecified: Secondary | ICD-10-CM | POA: Diagnosis not present

## 2021-04-09 DIAGNOSIS — R110 Nausea: Secondary | ICD-10-CM

## 2021-04-09 DIAGNOSIS — Z885 Allergy status to narcotic agent status: Secondary | ICD-10-CM | POA: Diagnosis not present

## 2021-04-09 DIAGNOSIS — G40909 Epilepsy, unspecified, not intractable, without status epilepticus: Secondary | ICD-10-CM | POA: Insufficient documentation

## 2021-04-09 DIAGNOSIS — C711 Malignant neoplasm of frontal lobe: Secondary | ICD-10-CM | POA: Insufficient documentation

## 2021-04-09 DIAGNOSIS — R0789 Other chest pain: Secondary | ICD-10-CM

## 2021-04-09 DIAGNOSIS — Z881 Allergy status to other antibiotic agents status: Secondary | ICD-10-CM | POA: Insufficient documentation

## 2021-04-09 DIAGNOSIS — Z79899 Other long term (current) drug therapy: Secondary | ICD-10-CM | POA: Diagnosis not present

## 2021-04-09 DIAGNOSIS — R202 Paresthesia of skin: Secondary | ICD-10-CM

## 2021-04-09 DIAGNOSIS — R4702 Dysphasia: Secondary | ICD-10-CM

## 2021-04-09 MED ORDER — CARISOPRODOL 350 MG PO TABS
350.0000 mg | ORAL_TABLET | Freq: Three times a day (TID) | ORAL | 0 refills | Status: DC
  Filled 2021-04-09: qty 270, 90d supply, fill #0

## 2021-04-09 NOTE — Assessment & Plan Note (Signed)
We had extensive discussions in the past about medication interaction and chronic dependency on multiple different agents that could affect her memory and seizures After a lot of discussion, she felt best not to taper Soma at this point as she felt recent taper might have affected her speech and tremor  After a long discussion, we are in agreement to taper Fioricet in the near future With her next refill in October, I plan to reduce Fioricet to twice daily and to keep everything else the same By January 2023, I plan to reduce Fioricet to once daily and then by April 2023, we will get her off Fioricet completely Next, we will start lorazepam taper in April 2023 with goal to reduce 1 tablet every 3 months I would likely see her again next year for further follow-up

## 2021-04-09 NOTE — Progress Notes (Signed)
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 The patient continues follow-up with brain imaging at [facility] I will continue supportive care  Polypharmacy We had extensive discussions in the past about medication interaction and chronic dependency on multiple different agents that could affect her memory and seizures After a lot of discussion, she felt best not to taper Soma at this point as she felt recent taper might have affected her speech and tremor  After a long discussion, we are in agreement to taper Fioricet in the near future With her next refill in October, I plan to reduce Fioricet to twice daily and to keep everything else the same By January 2023, I plan to reduce Fioricet to once daily and then by April 2023, we will get her off Fioricet completely Next, we will start lorazepam taper in April 2023 with goal to reduce 1 tablet every 3 months I would likely see her again next year for further follow-up  No orders of the defined types were placed in this encounter.   All questions were answered. The patient knows to call the clinic with any problems, questions or concerns. The total time spent in the appointment was 20 minutes encounter with patients including review of chart and various tests results, discussions about plan of care and coordination of care plan   Heath Lark, MD 04/09/2021 2:39 PM  INTERVAL HISTORY: Please see below for  problem oriented charting. she returns for follow-up with her husband to discuss medication taper From our previous discussion, we have agreed to reduce and taper the Soma dose Her last taper was in June of this year Since then, her husband noticed that the patient have more stutter, speech problem and tremor She felt that her overall wellbeing is affected On the other hand, she felt that she is needing less of lorazepam and Fioricet The patient has difficulties tolerating medication taper and was wondering why medication taper was initiated She will continue medical treatment and follow-up for seizure management through her neurologist and neurosurgeon in regards to management of chronic seizure disorder and persistent abnormalities seen on brain imaging  REVIEW OF SYSTEMS:   Constitutional: Denies fevers, chills or abnormal weight loss Eyes: Denies blurriness of vision Ears, nose, mouth, throat, and face: Denies mucositis or sore throat Respiratory: Denies cough, dyspnea or wheezes Cardiovascular: Denies palpitation, chest discomfort or lower extremity swelling Gastrointestinal:  Denies nausea, heartburn or change in bowel habits Skin: Denies abnormal skin rashes Lymphatics: Denies new lymphadenopathy or easy bruising Neurological:Denies numbness, tingling or new weaknesses Behavioral/Psych: Mood is stable, no new changes  All other systems were reviewed with the patient and are negative.  I have reviewed the past medical history, past surgical history, social history and family history with the patient and they are unchanged from previous note.  ALLERGIES:  is allergic to adhesive [tape], latex, medical adhesive remover, percocet [oxycodone-acetaminophen], prednisone, shellfish allergy, soma [carisoprodol], tramadol hcl, vancomycin, and zofran [ondansetron hcl].  MEDICATIONS:  Current Outpatient Medications  Medication Sig Dispense Refill   albuterol (VENTOLIN HFA) 108 (  90 Base)  MCG/ACT inhaler Inhale 2 puffs into the lungs every 4 (four) hours as needed for up to 30 days for Shortness of Breath. 18 g 0   aspirin EC 81 MG tablet Take 81 mg by mouth as needed. Swallow whole.     butalbital-acetaminophen-caffeine (FIORICET) 50-325-40 MG tablet Take 1 tablet by mouth every 6 hours as needed for headache 90 tablet 0   carisoprodol (SOMA) 350 MG tablet Take 1 tablet (350 mg total) by mouth 3 (three) times daily. 270 tablet 0   Cholecalciferol (D3 PO) Take by mouth.     divalproex (DEPAKOTE ER) 250 MG 24 hr tablet Take 2 tablets by mouth three times daily. 180 tablet 11   EPINEPHRINE 0.3 mg/0.3 mL IJ SOAJ injection INJECT 0.3 MLS (ONE SYRINGE) INTO THE MUSCLE ONCE FOR 1 DOSE AS NEEDED. (Patient taking differently: Inject 0.3 mg into the muscle once.) 0.3 mL 0   gabapentin (NEURONTIN) 300 MG capsule Take 3 capsules in morning, 2 capsules at noon,  and 3 capsules at bedtime 240 capsule 11   Galcanezumab-gnlm (EMGALITY) 120 MG/ML SOAJ Inject 1 pen into the skin every 30 (thirty) days. 1.12 mL 11   HYDROcodone-acetaminophen (NORCO) 10-325 MG tablet TAKE 1 TABLET BY MOUTH 3 TIMES DAILY AS NEEDED 90 tablet 0   hyoscyamine (LEVSIN) 0.125 MG tablet Take 1 tablet by mouth every 4-6 hours as needed. 120 tablet 0   LORazepam (ATIVAN) 0.5 MG tablet TAKE 1 TABLET BY MOUTH EVERY 8 HOURS AS NEEDED FOR ANXIETY OR SEIZURE 90 tablet 0   montelukast (SINGULAIR) 10 MG tablet Take 1 tablet (10 mg total) by mouth at bedtime. 30 tablet 9   pantoprazole (PROTONIX) 40 MG tablet Take 1 tablet by mouth twice daily 30 min before breakfast and 30 min before dinner 60 tablet 2   pantoprazole (PROTONIX) 40 MG tablet Take 1 tablet by mouth twice a day 180 tablet 4   Rimegepant Sulfate (NURTEC) 75 MG TBDP DISSOLVE 1 TABLET AS NEEDED FOR MIGRAINE. DO NOT TAKE MORE THAN 2-3 TIMES A WEEK. 8 tablet 2   sucralfate (CARAFATE) 1 g tablet Take 1 tablet by mouth on an empty stomach 2 times a day before breakfast and at  bedtime 60 tablet 1   sucralfate (CARAFATE) 1 g tablet Take 1 tablet by mouth 2 times a day (every morning and every evening) 60 tablet 4   topiramate (TOPAMAX) 25 MG tablet Take 1 tablet by mouth every night 30 tablet 6   zolmitriptan (ZOMIG) 5 MG tablet Take 1 tablet as needed at onset of migraine. Do not take more than 2-3 a week 10 tablet 11   No current facility-administered medications for this visit.    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   09/27/2013 Pathology Results   Accession: [id number] pathology showed grade 2 oligodendroglioma with mutant IDH1 protein, ATRX, OLIG 2 expression. p53 is negative, low Ki-67 labeling index. Molecular SNP array studies demonstrate whole arm 1p19q codeletion."   10/28/2013 Imaging   Ct scan of head showed possible abscess.   11/06/2013 Imaging   Repeat CT scan of the head showed resolution of abscess.   01/23/2014 Surgery   Dr. Christella Noa perform a subtotal gross resection of the tumor.   01/24/2014 Imaging   Repeat imaging studies showed persistent and residual disease.   03/03/2014 -  09/01/2014 Chemotherapy   She was started on Temodar, 5 days every 28 days   03/13/2014 Imaging   Repeat MRI showed no evidence of progression of disease.   07/14/2014 Imaging   Repeat MRI showed no evidence of disease progression.   09/12/2014 Imaging   MRI of the liver show mild regression of the size of lesions, presumed to be benign   09/15/2014 Imaging   Repeat MRI of the brain show persistent abnormalities, suspicious for residual disease   03/02/2015 Imaging    MRI brain at [facility] show persistent abnormalities   08/10/2015 Imaging   Repeat MRI [facility] showed stable disease   08/24/2015 - 02/08/2016 Chemotherapy   She was started on Lomustine, Procarbazine and Vincristine chemotherapy    08/30/2015 Imaging   CT chest showed no evidence of aspiration pneumonia   11/18/2015 Imaging   MRI brain showed status post resection of right frontal lobe tumor without residual or recurrent tumor. 2. Otherwise negative MRI of the brain.    03/02/2016 Imaging   Stable MRI post right frontal lobe tumor resection. Negative for recurrent tumor   06/01/2016 Imaging   Stable MRI post right frontal lobe tumor resection. No recurrent tumor identified   08/19/2016 Imaging   Unchanged examination without tumor recurrence.   11/17/2016 Imaging   Stable right frontal resection cavity. Stable focal mass effect within the right middle frontal gyrus at the superior margin of the cavity which may represent nonenhancing neoplasm. No evidence for tumor progression. Otherwise stable MRI of the brain   12/16/2016 Imaging   MR brain: 1. No acute intracranial abnormality. 2. Unchanged appearance of right frontal lobe resection cavity and surrounding hyperintense T2 weighted signal.   02/04/2017 Imaging   Repeat MRI Normal pituitary   Stable postop changes of right frontal tumor resection   05/12/2017 Imaging   The left ventricular size is normal. There is normal left ventricular wall thickness.  Left ventricular systolic function is normal. LV ejection fraction = 60-65%.  Left ventricular filling pattern is normal. The left ventricular wall motion is normal. The right ventricle is normal in size and function. There is no significant valvular stenosis or regurgitation No pulmonary hypertension. IVC size was normal. There is no pericardial effusion. There is no comparison study available   05/13/2017 Imaging   1.  No evidence of acute pulmonary thromboembolic disease. 2.  There is an 8 mm right lower lobe groundglass nodule. Statistically, in this age group, this is most likely to reflect benign etiologies such as a tiny focus of infection or nonspecific inflammation. 4 mm solid right upper lobe  lung nodule. A CT scan could be obtained in 12 months to further evaluate if the patient has any known risk factors for malignancy. 3.  There is a 1.3 cm left adrenal nodule. This finding is incompletely characterized although and is slightly increased in size relative to the abdominal MRI from February 2016. Although this finding is most likely benign, recommend follow-up adrenal protocol CT in one year.   05/13/2017 Imaging   MRI brain 1.  No acute intracranial abnormality. 2.  Postsurgical changes of a right frontal craniotomy for resection of a reported oligodendroglioma. Hyperintense T2/FLAIR signal around the resection cavity and in the subcortical white matter of a gyrus along the superior margin of the cavity is unchanged from 01/05/2015. No evidence of disease progression.   12/15/2017 Imaging   MRI brain at [facility] 1.  No acute intracranial abnormality. 2.  Postsurgical changes of right  frontal craniotomy for resection of reported oligodendroglioma. No evidence of disease progression   12/28/2017 Procedure   Successful right IJ vein Port-A-Cath explant.   04/24/2018 Imaging   Stable post treatment appearance, RIGHT frontal oligodendroglioma.  No posttraumatic sequelae are evident.   01/30/2019 Imaging   Stable appearance of treated right frontal oligodendroglioma. No change from 04/24/2018.   11/18/2019 Imaging   1. Stable postoperative changes of the right frontal lobe without evidence for residual or recurrent disease. 2. No acute intracranial abnormality or significant interval change.     PHYSICAL EXAMINATION: ECOG PERFORMANCE STATUS: 1 - Symptomatic but completely ambulatory  Vitals:   04/09/21 1414  BP: (!) 151/105  Pulse: (!) 109  Resp: 18  Temp: 98.9 F (37.2 C)  SpO2: 100%   Filed Weights   04/09/21 1414  Weight: 155 lb 12.8 oz (70.7 kg)    GENERAL:alert, no distress and comfortable NEURO: alert & oriented x 3 with fluent speech, no focal motor/sensory  deficits  LABORATORY DATA:  I have reviewed the data as listed    Component Value Date/Time   NA 141 02/24/2020 1018   NA 140 08/07/2019 0000   NA 140 06/15/2017 1207   K 3.9 02/24/2020 1018   K 3.9 06/15/2017 1207   CL 105 02/24/2020 1018   CO2 28 02/24/2020 1018   CO2 24 06/15/2017 1207   GLUCOSE 87 02/24/2020 1018   GLUCOSE 85 06/15/2017 1207   BUN 16 02/24/2020 1018   BUN 12 08/07/2019 0000   BUN 5.8 (L) 06/15/2017 1207   CREATININE 0.76 02/24/2020 1018   CREATININE 0.7 06/15/2017 1207   CALCIUM 9.6 02/24/2020 1018   CALCIUM 8.5 06/15/2017 1207   PROT 7.7 02/24/2020 1018   PROT 6.7 06/15/2017 1207   ALBUMIN 4.1 02/24/2020 1018   ALBUMIN 3.9 06/15/2017 1207   AST 17 02/24/2020 1018   AST 14 06/15/2017 1207   ALT 15 02/24/2020 1018   ALT 11 06/15/2017 1207   ALKPHOS 45 02/24/2020 1018   ALKPHOS 42 06/15/2017 1207   BILITOT 0.5 02/24/2020 1018   BILITOT 0.36 06/15/2017 1207   GFRNONAA >60 02/24/2020 1018   GFRAA >60 02/24/2020 1018    No results found for: SPEP, UPEP  Lab Results  Component Value Date   WBC 3.1 (L) 02/24/2020   NEUTROABS 1.6 (L) 02/24/2020   HGB 13.0 02/24/2020   HCT 38.7 02/24/2020   MCV 97.0 02/24/2020   PLT 248 02/24/2020      Chemistry      Component Value Date/Time   NA 141 02/24/2020 1018   NA 140 08/07/2019 0000   NA 140 06/15/2017 1207   K 3.9 02/24/2020 1018   K 3.9 06/15/2017 1207   CL 105 02/24/2020 1018   CO2 28 02/24/2020 1018   CO2 24 06/15/2017 1207   BUN 16 02/24/2020 1018   BUN 12 08/07/2019 0000   BUN 5.8 (L) 06/15/2017 1207   CREATININE 0.76 02/24/2020 1018   CREATININE 0.7 06/15/2017 1207      Component Value Date/Time   CALCIUM 9.6 02/24/2020 1018   CALCIUM 8.5 06/15/2017 1207   ALKPHOS 45 02/24/2020 1018   ALKPHOS 42 06/15/2017 1207   AST 17 02/24/2020 1018   AST 14 06/15/2017 1207   ALT 15 02/24/2020 1018   ALT 11 06/15/2017 1207   BILITOT 0.5 02/24/2020 1018   BILITOT 0.36 06/15/2017 1207

## 2021-04-09 NOTE — Assessment & Plan Note (Signed)
The patient continues follow-up with brain imaging at [facility] I will continue supportive care

## 2021-04-12 ENCOUNTER — Other Ambulatory Visit (HOSPITAL_COMMUNITY): Payer: Self-pay

## 2021-04-13 ENCOUNTER — Other Ambulatory Visit (HOSPITAL_COMMUNITY): Payer: Self-pay

## 2021-04-27 ENCOUNTER — Other Ambulatory Visit: Payer: Self-pay | Admitting: Hematology and Oncology

## 2021-04-27 ENCOUNTER — Telehealth: Payer: Self-pay

## 2021-04-27 ENCOUNTER — Other Ambulatory Visit (HOSPITAL_COMMUNITY): Payer: Self-pay

## 2021-04-27 DIAGNOSIS — C719 Malignant neoplasm of brain, unspecified: Secondary | ICD-10-CM

## 2021-04-27 MED ORDER — HYDROCODONE-ACETAMINOPHEN 10-325 MG PO TABS
1.0000 | ORAL_TABLET | Freq: Three times a day (TID) | ORAL | 0 refills | Status: DC | PRN
  Filled 2021-04-27 – 2021-04-28 (×2): qty 90, 30d supply, fill #0

## 2021-04-27 MED ORDER — BUTALBITAL-APAP-CAFFEINE 50-325-40 MG PO TABS
1.0000 | ORAL_TABLET | Freq: Three times a day (TID) | ORAL | 0 refills | Status: DC | PRN
  Filled 2021-04-27: qty 90, 30d supply, fill #0

## 2021-04-27 NOTE — Telephone Encounter (Signed)
Called and left below message. Ask her to call the office for questions. ?

## 2021-04-27 NOTE — Telephone Encounter (Signed)
-----   Message from Heath Lark, MD sent at 04/27/2021  2:58 PM EDT ----- I refilled her fioricet and hydrocodone Per previous discussion, I reduced fioricet to TID/PRN Next refill, we will reduce to BID/PRN

## 2021-04-28 ENCOUNTER — Other Ambulatory Visit (HOSPITAL_COMMUNITY): Payer: Self-pay

## 2021-05-10 ENCOUNTER — Other Ambulatory Visit (HOSPITAL_COMMUNITY): Payer: Self-pay

## 2021-05-11 ENCOUNTER — Other Ambulatory Visit (HOSPITAL_COMMUNITY): Payer: Self-pay

## 2021-05-24 ENCOUNTER — Other Ambulatory Visit: Payer: Self-pay | Admitting: Hematology and Oncology

## 2021-05-24 ENCOUNTER — Other Ambulatory Visit (HOSPITAL_COMMUNITY): Payer: Self-pay

## 2021-05-24 ENCOUNTER — Other Ambulatory Visit: Payer: Self-pay | Admitting: Neurology

## 2021-05-24 ENCOUNTER — Encounter: Payer: Self-pay | Admitting: Hematology and Oncology

## 2021-05-24 MED ORDER — NURTEC 75 MG PO TBDP
ORAL_TABLET | ORAL | 2 refills | Status: DC
  Filled 2021-05-24: qty 8, 30d supply, fill #0
  Filled 2021-06-21: qty 8, 30d supply, fill #1
  Filled 2021-07-21: qty 8, 30d supply, fill #2

## 2021-05-24 MED ORDER — BUTALBITAL-APAP-CAFFEINE 50-325-40 MG PO TABS
1.0000 | ORAL_TABLET | Freq: Three times a day (TID) | ORAL | 0 refills | Status: DC | PRN
Start: 2021-05-24 — End: 2021-06-21
  Filled 2021-05-24: qty 90, 30d supply, fill #0

## 2021-05-24 MED ORDER — LORAZEPAM 0.5 MG PO TABS
ORAL_TABLET | ORAL | 0 refills | Status: DC
  Filled 2021-05-24: qty 90, 30d supply, fill #0

## 2021-05-26 ENCOUNTER — Other Ambulatory Visit: Payer: Self-pay | Admitting: Hematology and Oncology

## 2021-05-26 DIAGNOSIS — C719 Malignant neoplasm of brain, unspecified: Secondary | ICD-10-CM

## 2021-05-27 ENCOUNTER — Other Ambulatory Visit (HOSPITAL_COMMUNITY): Payer: Self-pay

## 2021-05-27 MED ORDER — HYDROCODONE-ACETAMINOPHEN 10-325 MG PO TABS
1.0000 | ORAL_TABLET | Freq: Three times a day (TID) | ORAL | 0 refills | Status: DC | PRN
  Filled 2021-05-27: qty 90, 30d supply, fill #0

## 2021-06-08 ENCOUNTER — Other Ambulatory Visit (HOSPITAL_COMMUNITY): Payer: Self-pay

## 2021-06-16 ENCOUNTER — Other Ambulatory Visit: Payer: Self-pay | Admitting: Hematology and Oncology

## 2021-06-16 ENCOUNTER — Other Ambulatory Visit (HOSPITAL_COMMUNITY): Payer: Self-pay

## 2021-06-17 ENCOUNTER — Other Ambulatory Visit (HOSPITAL_COMMUNITY): Payer: Self-pay

## 2021-06-21 ENCOUNTER — Other Ambulatory Visit: Payer: Self-pay | Admitting: Hematology and Oncology

## 2021-06-21 ENCOUNTER — Other Ambulatory Visit (HOSPITAL_COMMUNITY): Payer: Self-pay

## 2021-06-21 MED ORDER — BUTALBITAL-APAP-CAFFEINE 50-325-40 MG PO TABS
1.0000 | ORAL_TABLET | Freq: Three times a day (TID) | ORAL | 0 refills | Status: DC | PRN
  Filled 2021-06-21: qty 90, 30d supply, fill #0

## 2021-06-24 ENCOUNTER — Other Ambulatory Visit (HOSPITAL_COMMUNITY): Payer: Self-pay

## 2021-06-24 ENCOUNTER — Other Ambulatory Visit: Payer: Self-pay | Admitting: Hematology and Oncology

## 2021-06-24 DIAGNOSIS — C719 Malignant neoplasm of brain, unspecified: Secondary | ICD-10-CM

## 2021-06-24 MED ORDER — HYDROCODONE-ACETAMINOPHEN 10-325 MG PO TABS
1.0000 | ORAL_TABLET | Freq: Three times a day (TID) | ORAL | 0 refills | Status: DC | PRN
  Filled 2021-06-24: qty 90, 30d supply, fill #0

## 2021-07-05 ENCOUNTER — Telehealth: Payer: Self-pay

## 2021-07-05 ENCOUNTER — Other Ambulatory Visit: Payer: Self-pay | Admitting: Hematology and Oncology

## 2021-07-05 ENCOUNTER — Other Ambulatory Visit (HOSPITAL_COMMUNITY): Payer: Self-pay

## 2021-07-05 DIAGNOSIS — R0789 Other chest pain: Secondary | ICD-10-CM

## 2021-07-05 DIAGNOSIS — R110 Nausea: Secondary | ICD-10-CM

## 2021-07-05 DIAGNOSIS — R202 Paresthesia of skin: Secondary | ICD-10-CM

## 2021-07-05 DIAGNOSIS — R519 Headache, unspecified: Secondary | ICD-10-CM

## 2021-07-05 DIAGNOSIS — C711 Malignant neoplasm of frontal lobe: Secondary | ICD-10-CM

## 2021-07-05 DIAGNOSIS — R4702 Dysphasia: Secondary | ICD-10-CM

## 2021-07-05 MED ORDER — CARISOPRODOL 350 MG PO TABS
350.0000 mg | ORAL_TABLET | Freq: Three times a day (TID) | ORAL | 0 refills | Status: DC
  Filled 2021-07-05 – 2021-07-06 (×2): qty 270, 90d supply, fill #0

## 2021-07-05 MED ORDER — LORAZEPAM 0.5 MG PO TABS
0.5000 mg | ORAL_TABLET | Freq: Two times a day (BID) | ORAL | 0 refills | Status: DC | PRN
Start: 2021-07-05 — End: 2021-10-14
  Filled 2021-07-05: qty 60, 30d supply, fill #0

## 2021-07-05 NOTE — Telephone Encounter (Signed)
Called and ask if it is okay to reduce Lorazepam to BID. She agreed to BID.

## 2021-07-06 ENCOUNTER — Other Ambulatory Visit (HOSPITAL_COMMUNITY): Payer: Self-pay

## 2021-07-07 ENCOUNTER — Other Ambulatory Visit (HOSPITAL_COMMUNITY): Payer: Self-pay

## 2021-07-08 ENCOUNTER — Ambulatory Visit: Payer: Medicare Other | Admitting: Neurology

## 2021-07-09 ENCOUNTER — Other Ambulatory Visit (HOSPITAL_COMMUNITY): Payer: Self-pay

## 2021-07-20 ENCOUNTER — Other Ambulatory Visit (HOSPITAL_COMMUNITY): Payer: Self-pay

## 2021-07-21 ENCOUNTER — Other Ambulatory Visit (HOSPITAL_COMMUNITY): Payer: Self-pay

## 2021-07-21 ENCOUNTER — Other Ambulatory Visit: Payer: Self-pay | Admitting: Hematology and Oncology

## 2021-07-21 DIAGNOSIS — C719 Malignant neoplasm of brain, unspecified: Secondary | ICD-10-CM

## 2021-07-22 ENCOUNTER — Other Ambulatory Visit (HOSPITAL_COMMUNITY): Payer: Self-pay

## 2021-07-22 MED ORDER — BUTALBITAL-APAP-CAFFEINE 50-325-40 MG PO TABS
1.0000 | ORAL_TABLET | Freq: Three times a day (TID) | ORAL | 0 refills | Status: DC | PRN
  Filled 2021-07-22: qty 90, 30d supply, fill #0

## 2021-07-22 MED ORDER — HYDROCODONE-ACETAMINOPHEN 10-325 MG PO TABS
1.0000 | ORAL_TABLET | Freq: Three times a day (TID) | ORAL | 0 refills | Status: DC | PRN
  Filled 2021-07-22: qty 90, 30d supply, fill #0

## 2021-07-28 ENCOUNTER — Encounter: Payer: Self-pay | Admitting: Hematology and Oncology

## 2021-07-28 DIAGNOSIS — Z859 Personal history of malignant neoplasm, unspecified: Secondary | ICD-10-CM | POA: Diagnosis not present

## 2021-07-28 DIAGNOSIS — Z8041 Family history of malignant neoplasm of ovary: Secondary | ICD-10-CM | POA: Diagnosis not present

## 2021-07-28 DIAGNOSIS — Z8669 Personal history of other diseases of the nervous system and sense organs: Secondary | ICD-10-CM | POA: Diagnosis not present

## 2021-07-28 DIAGNOSIS — G43909 Migraine, unspecified, not intractable, without status migrainosus: Secondary | ICD-10-CM | POA: Diagnosis not present

## 2021-07-28 DIAGNOSIS — Z8673 Personal history of transient ischemic attack (TIA), and cerebral infarction without residual deficits: Secondary | ICD-10-CM | POA: Diagnosis not present

## 2021-07-28 DIAGNOSIS — H547 Unspecified visual loss: Secondary | ICD-10-CM | POA: Diagnosis not present

## 2021-07-28 DIAGNOSIS — C711 Malignant neoplasm of frontal lobe: Secondary | ICD-10-CM | POA: Diagnosis not present

## 2021-08-04 ENCOUNTER — Other Ambulatory Visit (HOSPITAL_COMMUNITY): Payer: Self-pay

## 2021-08-09 ENCOUNTER — Encounter: Payer: Self-pay | Admitting: Hematology and Oncology

## 2021-08-09 ENCOUNTER — Other Ambulatory Visit: Payer: Self-pay

## 2021-08-09 ENCOUNTER — Inpatient Hospital Stay: Payer: Medicare Other | Attending: Hematology and Oncology | Admitting: Hematology and Oncology

## 2021-08-09 DIAGNOSIS — G40109 Localization-related (focal) (partial) symptomatic epilepsy and epileptic syndromes with simple partial seizures, not intractable, without status epilepticus: Secondary | ICD-10-CM | POA: Diagnosis not present

## 2021-08-09 DIAGNOSIS — G44021 Chronic cluster headache, intractable: Secondary | ICD-10-CM | POA: Diagnosis not present

## 2021-08-09 DIAGNOSIS — C711 Malignant neoplasm of frontal lobe: Secondary | ICD-10-CM | POA: Diagnosis not present

## 2021-08-09 DIAGNOSIS — G8929 Other chronic pain: Secondary | ICD-10-CM | POA: Diagnosis not present

## 2021-08-09 DIAGNOSIS — Z881 Allergy status to other antibiotic agents status: Secondary | ICD-10-CM | POA: Diagnosis not present

## 2021-08-09 DIAGNOSIS — Z885 Allergy status to narcotic agent status: Secondary | ICD-10-CM | POA: Insufficient documentation

## 2021-08-09 DIAGNOSIS — R4189 Other symptoms and signs involving cognitive functions and awareness: Secondary | ICD-10-CM | POA: Diagnosis not present

## 2021-08-09 DIAGNOSIS — Z888 Allergy status to other drugs, medicaments and biological substances status: Secondary | ICD-10-CM | POA: Diagnosis not present

## 2021-08-09 DIAGNOSIS — Z79899 Other long term (current) drug therapy: Secondary | ICD-10-CM | POA: Insufficient documentation

## 2021-08-09 DIAGNOSIS — G3184 Mild cognitive impairment, so stated: Secondary | ICD-10-CM | POA: Insufficient documentation

## 2021-08-09 NOTE — Assessment & Plan Note (Signed)
I have reviewed results of her MRI and documentation by neuro oncologist from [facility] Overall, MRI showed no significant changes/progression of disease Her chronic headaches and seizure is stable I explained to the patient the rationale of follow-up at [facility]; I would not be able to help her with imaging follow-up and discussion about best treatment plan She needs to see a neuro oncologist in that regard After long discussion, she is in agreement for long-term follow-up to every 6 months or so

## 2021-08-09 NOTE — Assessment & Plan Note (Signed)
The patient has complex chronic headaches, with difficulties with medication taper in the past Due to better control of her headaches and seizures with increased dose of gabapentin recently, my plan for this year would be to continue all her medications except we will continue to taper her off lorazepam by the end of the year I will continue to refill her prescription pain medicine for now without changes

## 2021-08-09 NOTE — Assessment & Plan Note (Signed)
In the past few months since last time I saw her, she reported less seizures She is able to tolerate gabapentin fairly well at current dose which I believe is helping with seizure control I would defer to neurologist for further management

## 2021-08-09 NOTE — Assessment & Plan Note (Signed)
She has mild cognitive impairment with difficulties with memory I explained to the patient the importance of future follow-up with her husband present so that we can discuss plan of care together

## 2021-08-09 NOTE — Progress Notes (Signed)
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 I have reviewed results of her MRI and documentation by neuro oncologist from [facility] Overall, MRI showed no significant changes/progression of disease Her chronic headaches and seizure is stable I explained to the patient the rationale of follow-up at [facility]; I would not be able to help her with imaging follow-up and discussion about best treatment plan She needs to see a neuro oncologist in that regard After long discussion, she is in agreement for long-term follow-up to every 6 months or so  Simple partial seizures In the past few months since last time I saw her, she reported less seizures She is able to tolerate gabapentin fairly well at current dose which I believe is helping with seizure control I would defer to neurologist for further management  Intractable chronic cluster headache The patient has complex chronic headaches, with difficulties with medication taper in the past Due to better control of her headaches and seizures with increased dose of gabapentin recently, my plan for this year would be to continue all her medications except we will continue to taper her off lorazepam by the end of the year I will continue to refill her prescription pain medicine for now without changes  Cognitive impairment She has mild cognitive impairment with difficulties with memory I explained to the patient  the importance of future follow-up with her husband present so that we can discuss plan of care together  No orders of the defined types were placed in this encounter.   All questions were answered. The patient knows to call the clinic with any problems, questions or concerns. The total time spent in the appointment was 30 minutes encounter with patients including review of chart and various tests results, discussions about plan of care and coordination of care plan   Heath Lark, MD 08/09/2021 12:50 PM  INTERVAL HISTORY: Please see below for problem oriented charting. she returns for treatment follow-up with her husband I have reviewed documentation from [facility] The patient appears to have difficulties remembering why I am seeing her today The patient has contacted [place] several weeks ago and has expressed concern about future follow-up in the role of imaging study at [facility] According to the patient and her husband, her chronic pain is stable and she has experienced less seizures No new neurological deficits, except she has some difficulties remembering some details of her recent appointment  REVIEW OF SYSTEMS:   Constitutional: Denies fevers, chills or abnormal weight loss Eyes: Denies blurriness of vision Ears, nose, mouth, throat, and face: Denies mucositis or sore throat Respiratory: Denies cough, dyspnea or wheezes Cardiovascular: Denies palpitation, chest discomfort or lower extremity swelling Gastrointestinal:  Denies nausea, heartburn or change in bowel habits Skin: Denies abnormal skin rashes Lymphatics: Denies new lymphadenopathy or easy bruising Neurological:Denies numbness, tingling or new weaknesses Behavioral/Psych: Mood is stable, no new changes  All other systems were reviewed with the patient and are negative.  I have reviewed the past medical history, past surgical history, social history and family history with  the patient and they are unchanged from previous  note.  ALLERGIES:  is allergic to adhesive [tape], latex, medical adhesive remover, percocet [oxycodone-acetaminophen], prednisone, shellfish allergy, soma [carisoprodol], tramadol hcl, vancomycin, and zofran [ondansetron hcl].  MEDICATIONS:  Current Outpatient Medications  Medication Sig Dispense Refill   albuterol (VENTOLIN HFA) 108 (90 Base) MCG/ACT inhaler Inhale 2 puffs into the lungs every 4 (four) hours as needed for up to 30 days for Shortness of Breath. 18 g 0   aspirin EC 81 MG tablet Take 81 mg by mouth as needed. Swallow whole.     butalbital-acetaminophen-caffeine (FIORICET) 50-325-40 MG tablet Take 1 tablet by mouth 3 (three) times daily as needed for headache. 90 tablet 0   carisoprodol (SOMA) 350 MG tablet Take 1 tablet by mouth 3 times daily. 270 tablet 0   Cholecalciferol (D3 PO) Take by mouth.     divalproex (DEPAKOTE ER) 250 MG 24 hr tablet Take 2 tablets by mouth three times daily. 180 tablet 11   EPINEPHRINE 0.3 mg/0.3 mL IJ SOAJ injection INJECT 0.3 MLS (ONE SYRINGE) INTO THE MUSCLE ONCE FOR 1 DOSE AS NEEDED. (Patient taking differently: Inject 0.3 mg into the muscle once.) 0.3 mL 0   gabapentin (NEURONTIN) 300 MG capsule Take 3 capsules in morning, 2 capsules at noon,  and 3 capsules at bedtime 240 capsule 11   Galcanezumab-gnlm (EMGALITY) 120 MG/ML SOAJ Inject 1 pen into the skin every 30 (thirty) days. 1.12 mL 11   HYDROcodone-acetaminophen (NORCO) 10-325 MG tablet TAKE 1 TABLET BY MOUTH 3 TIMES DAILY AS NEEDED 90 tablet 0   hyoscyamine (LEVSIN) 0.125 MG tablet Take 1 tablet by mouth every 4-6 hours as needed. 120 tablet 0   LORazepam (ATIVAN) 0.5 MG tablet Take 1 tablet (0.5 mg total) by mouth 2 (two) times daily as needed for anxiety. 60 tablet 0   Rimegepant Sulfate (NURTEC) 75 MG TBDP DISSOLVE 1 TABLET BY MOUTH AS NEEDED FOR MIGRAINE. DO NOT TAKE MORE THAN 2-3 TIMES A WEEK. 8 tablet 2   topiramate (TOPAMAX) 25 MG tablet Take 1 tablet by mouth every night 30 tablet 6    zolmitriptan (ZOMIG) 5 MG tablet Take 1 tablet as needed at onset of migraine. Do not take more than 2-3 a week 10 tablet 11   No current facility-administered medications for this visit.    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   09/27/2013 Pathology Results   Accession: [id number] pathology showed grade 2 oligodendroglioma with mutant IDH1 protein, ATRX, OLIG 2 expression. p53 is negative, low Ki-67 labeling index. Molecular SNP array studies demonstrate whole arm 1p19q codeletion."   10/28/2013 Imaging   Ct scan of head showed possible abscess.   11/06/2013 Imaging   Repeat CT scan of the head showed resolution of abscess.   01/23/2014 Surgery   Dr. Christella Noa perform a subtotal gross resection of the tumor.   01/24/2014 Imaging   Repeat imaging studies showed persistent and residual disease.   03/03/2014 - 09/01/2014 Chemotherapy   She was started on Temodar, 5 days every 28 days   03/13/2014 Imaging   Repeat MRI showed no evidence of progression of disease.   07/14/2014 Imaging   Repeat MRI showed no evidence of disease progression.   09/12/2014 Imaging   MRI of the liver show mild regression of the size of lesions, presumed  to be benign   09/15/2014 Imaging   Repeat MRI of the brain show persistent abnormalities, suspicious for residual disease   03/02/2015 Imaging    MRI brain at [place] show persistent abnormalities   08/10/2015 Imaging   Repeat MRI [facility] showed stable disease   08/24/2015 - 02/08/2016 Chemotherapy   She was started on Lomustine, Procarbazine and Vincristine chemotherapy   08/30/2015 Imaging   CT chest showed no evidence of aspiration pneumonia   11/18/2015 Imaging   MRI brain showed status post resection of right frontal lobe tumor without residual or recurrent tumor. 2. Otherwise  negative MRI of the brain.    03/02/2016 Imaging   Stable MRI post right frontal lobe tumor resection. Negative for recurrent tumor   06/01/2016 Imaging   Stable MRI post right frontal lobe tumor resection. No recurrent tumor identified   08/19/2016 Imaging   Unchanged examination without tumor recurrence.   11/17/2016 Imaging   Stable right frontal resection cavity. Stable focal mass effect within the right middle frontal gyrus at the superior margin of the cavity which may represent nonenhancing neoplasm. No evidence for tumor progression. Otherwise stable MRI of the brain   12/16/2016 Imaging   MR brain: 1. No acute intracranial abnormality. 2. Unchanged appearance of right frontal lobe resection cavity and surrounding hyperintense T2 weighted signal.   02/04/2017 Imaging   Repeat MRI Normal pituitary   Stable postop changes of right frontal tumor resection   05/12/2017 Imaging   The left ventricular size is normal. There is normal left ventricular wall thickness.  Left ventricular systolic function is normal. LV ejection fraction = 60-65%.  Left ventricular filling pattern is normal. The left ventricular wall motion is normal. The right ventricle is normal in size and function. There is no significant valvular stenosis or regurgitation No pulmonary hypertension. IVC size was normal. There is no pericardial effusion. There is no comparison study available   05/13/2017 Imaging   1.  No evidence of acute pulmonary thromboembolic disease. 2.  There is an 8 mm right lower lobe groundglass nodule. Statistically, in this age group, this is most likely to reflect benign etiologies such as a tiny focus of infection or nonspecific inflammation. 4 mm solid right upper lobe lung nodule. A CT scan could be obtained in 12 months to further evaluate if the patient has any known risk factors for malignancy. 3.  There is a 1.3 cm left adrenal nodule. This finding is incompletely characterized  although and is slightly increased in size relative to the abdominal MRI from February 2016. Although this finding is most likely benign, recommend follow-up adrenal protocol CT in one year.   05/13/2017 Imaging   MRI brain 1.  No acute intracranial abnormality. 2.  Postsurgical changes of a right frontal craniotomy for resection of a reported oligodendroglioma. Hyperintense T2/FLAIR signal around the resection cavity and in the subcortical white matter of a gyrus along the superior margin of the cavity is unchanged from 01/05/2015. No evidence of disease progression.   12/15/2017 Imaging   MRI brain at [facility] 1.  No acute intracranial abnormality. 2.  Postsurgical changes of right frontal craniotomy for resection of reported oligodendroglioma. No evidence of disease progression   12/28/2017 Procedure   Successful right IJ vein Port-A-Cath explant.   04/24/2018 Imaging   Stable post treatment appearance, RIGHT frontal oligodendroglioma.  No posttraumatic sequelae are evident.   01/30/2019 Imaging   Stable appearance of treated right frontal oligodendroglioma. No change from 04/24/2018.  11/18/2019 Imaging   1. Stable postoperative changes of the right frontal lobe without evidence for residual or recurrent disease. 2. No acute intracranial abnormality or significant interval change.     PHYSICAL EXAMINATION: ECOG PERFORMANCE STATUS: 1 - Symptomatic but completely ambulatory  Vitals:   08/09/21 1148  BP: (!) 175/150  Pulse: 93  Resp: 18  Temp: 97.8 F (36.6 C)  SpO2: 100%   Filed Weights   08/09/21 1148  Weight: 155 lb 3.2 oz (70.4 kg)    GENERAL:alert, no distress and comfortable  NEURO: alert & oriented x 3 with fluent speech, no focal motor/sensory deficits  LABORATORY DATA:  I have reviewed the data as listed    Component Value Date/Time   NA 141 02/24/2020 1018   NA 140 08/07/2019 0000   NA 140 06/15/2017 1207   K 3.9 02/24/2020 1018   K 3.9 06/15/2017  1207   CL 105 02/24/2020 1018   CO2 28 02/24/2020 1018   CO2 24 06/15/2017 1207   GLUCOSE 87 02/24/2020 1018   GLUCOSE 85 06/15/2017 1207   BUN 16 02/24/2020 1018   BUN 12 08/07/2019 0000   BUN 5.8 (L) 06/15/2017 1207   CREATININE 0.76 02/24/2020 1018   CREATININE 0.7 06/15/2017 1207   CALCIUM 9.6 02/24/2020 1018   CALCIUM 8.5 06/15/2017 1207   PROT 7.7 02/24/2020 1018   PROT 6.7 06/15/2017 1207   ALBUMIN 4.1 02/24/2020 1018   ALBUMIN 3.9 06/15/2017 1207   AST 17 02/24/2020 1018   AST 14 06/15/2017 1207   ALT 15 02/24/2020 1018   ALT 11 06/15/2017 1207   ALKPHOS 45 02/24/2020 1018   ALKPHOS 42 06/15/2017 1207   BILITOT 0.5 02/24/2020 1018   BILITOT 0.36 06/15/2017 1207   GFRNONAA >60 02/24/2020 1018   GFRAA >60 02/24/2020 1018    No results found for: SPEP, UPEP  Lab Results  Component Value Date   WBC 3.1 (L) 02/24/2020   NEUTROABS 1.6 (L) 02/24/2020   HGB 13.0 02/24/2020   HCT 38.7 02/24/2020   MCV 97.0 02/24/2020   PLT 248 02/24/2020      Chemistry      Component Value Date/Time   NA 141 02/24/2020 1018   NA 140 08/07/2019 0000   NA 140 06/15/2017 1207   K 3.9 02/24/2020 1018   K 3.9 06/15/2017 1207   CL 105 02/24/2020 1018   CO2 28 02/24/2020 1018   CO2 24 06/15/2017 1207   BUN 16 02/24/2020 1018   BUN 12 08/07/2019 0000   BUN 5.8 (L) 06/15/2017 1207   CREATININE 0.76 02/24/2020 1018   CREATININE 0.7 06/15/2017 1207      Component Value Date/Time   CALCIUM 9.6 02/24/2020 1018   CALCIUM 8.5 06/15/2017 1207   ALKPHOS 45 02/24/2020 1018   ALKPHOS 42 06/15/2017 1207   AST 17 02/24/2020 1018   AST 14 06/15/2017 1207   ALT 15 02/24/2020 1018   ALT 11 06/15/2017 1207   BILITOT 0.5 02/24/2020 1018   BILITOT 0.36 06/15/2017 1207

## 2021-08-17 ENCOUNTER — Telehealth: Payer: Self-pay

## 2021-08-17 ENCOUNTER — Encounter: Payer: Self-pay | Admitting: Hematology and Oncology

## 2021-08-17 ENCOUNTER — Other Ambulatory Visit: Payer: Self-pay | Admitting: Hematology and Oncology

## 2021-08-17 DIAGNOSIS — C719 Malignant neoplasm of brain, unspecified: Secondary | ICD-10-CM

## 2021-08-17 NOTE — Telephone Encounter (Signed)
[name] just called back. Tasheika needs both of the prescriptions.

## 2021-08-17 NOTE — Telephone Encounter (Signed)
Called back to clarify prescription request for Victoria Bates with her husband, [name]. Rx refills not due to 1/4. [name] will call back Friday to request refill. [name] verbalized understanding.

## 2021-08-17 NOTE — Telephone Encounter (Signed)
[name] called back. Jalessa needs both of the prescriptions. Dr. Jacklynn Lewis given message.

## 2021-08-17 NOTE — Telephone Encounter (Signed)
Please check but I do not think she is due for refill

## 2021-08-17 NOTE — Telephone Encounter (Signed)
Called and left a message asking her to call the office back. Does she need a refill on the Fioricet and Norco? The office received a request.

## 2021-08-19 ENCOUNTER — Other Ambulatory Visit (HOSPITAL_COMMUNITY): Payer: Self-pay

## 2021-08-19 ENCOUNTER — Telehealth: Payer: Self-pay

## 2021-08-19 MED ORDER — BUTALBITAL-APAP-CAFFEINE 50-325-40 MG PO TABS
1.0000 | ORAL_TABLET | Freq: Three times a day (TID) | ORAL | 0 refills | Status: DC | PRN
  Filled 2021-08-19: qty 90, 30d supply, fill #0

## 2021-08-19 MED ORDER — HYDROCODONE-ACETAMINOPHEN 10-325 MG PO TABS
1.0000 | ORAL_TABLET | Freq: Three times a day (TID) | ORAL | 0 refills | Status: DC | PRN
  Filled 2021-08-19: qty 90, 30d supply, fill #0

## 2021-08-19 NOTE — Telephone Encounter (Signed)
Called and given below message. [name] verbalized understanding.

## 2021-08-19 NOTE — Telephone Encounter (Signed)
Victoria Bates called and left a message asking if you call refill the hydrocodone Rx today? He can pick it up today after work. Victoria Bates will be out after today.

## 2021-08-19 NOTE — Telephone Encounter (Signed)
I would do it for this time only Please tell Victoria Bates that technically we are refilling all her pills early, I suggest he monitors her pill intake in the future

## 2021-08-31 ENCOUNTER — Other Ambulatory Visit (HOSPITAL_COMMUNITY): Payer: Self-pay

## 2021-09-01 ENCOUNTER — Other Ambulatory Visit (HOSPITAL_COMMUNITY): Payer: Self-pay

## 2021-09-01 DIAGNOSIS — M545 Low back pain, unspecified: Secondary | ICD-10-CM | POA: Diagnosis not present

## 2021-09-01 DIAGNOSIS — M25521 Pain in right elbow: Secondary | ICD-10-CM | POA: Diagnosis not present

## 2021-09-03 ENCOUNTER — Other Ambulatory Visit: Payer: Self-pay | Admitting: Hematology and Oncology

## 2021-09-03 ENCOUNTER — Other Ambulatory Visit (HOSPITAL_COMMUNITY): Payer: Self-pay

## 2021-09-06 DIAGNOSIS — M545 Low back pain, unspecified: Secondary | ICD-10-CM | POA: Diagnosis not present

## 2021-09-06 NOTE — Telephone Encounter (Signed)
I am going to decline this, too soon for refill

## 2021-09-07 ENCOUNTER — Other Ambulatory Visit (HOSPITAL_COMMUNITY): Payer: Self-pay

## 2021-09-10 ENCOUNTER — Other Ambulatory Visit: Payer: Self-pay | Admitting: Neurology

## 2021-09-10 ENCOUNTER — Encounter: Payer: Self-pay | Admitting: Hematology and Oncology

## 2021-09-10 ENCOUNTER — Other Ambulatory Visit (HOSPITAL_COMMUNITY): Payer: Self-pay

## 2021-09-14 DIAGNOSIS — M5126 Other intervertebral disc displacement, lumbar region: Secondary | ICD-10-CM | POA: Diagnosis not present

## 2021-09-14 DIAGNOSIS — M5416 Radiculopathy, lumbar region: Secondary | ICD-10-CM | POA: Diagnosis not present

## 2021-09-16 ENCOUNTER — Other Ambulatory Visit: Payer: Self-pay | Admitting: Hematology and Oncology

## 2021-09-16 ENCOUNTER — Other Ambulatory Visit (HOSPITAL_COMMUNITY): Payer: Self-pay

## 2021-09-16 DIAGNOSIS — C719 Malignant neoplasm of brain, unspecified: Secondary | ICD-10-CM

## 2021-09-16 MED ORDER — BUTALBITAL-APAP-CAFFEINE 50-325-40 MG PO TABS
1.0000 | ORAL_TABLET | Freq: Three times a day (TID) | ORAL | 0 refills | Status: DC | PRN
  Filled 2021-09-16: qty 90, 30d supply, fill #0

## 2021-09-16 MED ORDER — HYDROCODONE-ACETAMINOPHEN 10-325 MG PO TABS
1.0000 | ORAL_TABLET | Freq: Three times a day (TID) | ORAL | 0 refills | Status: DC | PRN
  Filled 2021-09-16: qty 90, 30d supply, fill #0

## 2021-09-20 DIAGNOSIS — M5416 Radiculopathy, lumbar region: Secondary | ICD-10-CM | POA: Diagnosis not present

## 2021-09-23 ENCOUNTER — Other Ambulatory Visit (HOSPITAL_COMMUNITY): Payer: Self-pay

## 2021-09-23 MED ORDER — TIZANIDINE HCL 4 MG PO TABS
ORAL_TABLET | ORAL | 1 refills | Status: DC
  Filled 2021-09-23: qty 30, 20d supply, fill #0

## 2021-09-23 MED ORDER — METHYLPREDNISOLONE 4 MG PO TBPK
ORAL_TABLET | ORAL | 0 refills | Status: DC
  Filled 2021-09-23: qty 21, 6d supply, fill #0

## 2021-09-24 ENCOUNTER — Other Ambulatory Visit (HOSPITAL_COMMUNITY): Payer: Self-pay

## 2021-09-29 ENCOUNTER — Encounter: Payer: Self-pay | Admitting: Neurology

## 2021-09-29 ENCOUNTER — Other Ambulatory Visit: Payer: Self-pay | Admitting: Neurology

## 2021-09-29 ENCOUNTER — Other Ambulatory Visit: Payer: Self-pay | Admitting: Hematology and Oncology

## 2021-09-29 ENCOUNTER — Other Ambulatory Visit (HOSPITAL_COMMUNITY): Payer: Self-pay

## 2021-09-29 DIAGNOSIS — R110 Nausea: Secondary | ICD-10-CM

## 2021-09-29 DIAGNOSIS — R519 Headache, unspecified: Secondary | ICD-10-CM

## 2021-09-29 DIAGNOSIS — C711 Malignant neoplasm of frontal lobe: Secondary | ICD-10-CM

## 2021-09-29 DIAGNOSIS — R202 Paresthesia of skin: Secondary | ICD-10-CM

## 2021-09-29 DIAGNOSIS — R4702 Dysphasia: Secondary | ICD-10-CM

## 2021-09-29 DIAGNOSIS — R0789 Other chest pain: Secondary | ICD-10-CM

## 2021-09-29 MED ORDER — CARISOPRODOL 350 MG PO TABS
350.0000 mg | ORAL_TABLET | Freq: Three times a day (TID) | ORAL | 0 refills | Status: DC
  Filled 2021-09-29 – 2021-10-01 (×2): qty 270, 90d supply, fill #0

## 2021-09-30 ENCOUNTER — Other Ambulatory Visit (HOSPITAL_COMMUNITY): Payer: Self-pay

## 2021-09-30 DIAGNOSIS — R631 Polydipsia: Secondary | ICD-10-CM | POA: Diagnosis not present

## 2021-09-30 DIAGNOSIS — G40909 Epilepsy, unspecified, not intractable, without status epilepticus: Secondary | ICD-10-CM | POA: Diagnosis not present

## 2021-09-30 DIAGNOSIS — M5030 Other cervical disc degeneration, unspecified cervical region: Secondary | ICD-10-CM | POA: Diagnosis not present

## 2021-09-30 DIAGNOSIS — M5136 Other intervertebral disc degeneration, lumbar region: Secondary | ICD-10-CM | POA: Diagnosis not present

## 2021-09-30 DIAGNOSIS — J4520 Mild intermittent asthma, uncomplicated: Secondary | ICD-10-CM | POA: Diagnosis not present

## 2021-09-30 DIAGNOSIS — D069 Carcinoma in situ of cervix, unspecified: Secondary | ICD-10-CM | POA: Diagnosis not present

## 2021-09-30 DIAGNOSIS — K7689 Other specified diseases of liver: Secondary | ICD-10-CM | POA: Diagnosis not present

## 2021-09-30 DIAGNOSIS — F419 Anxiety disorder, unspecified: Secondary | ICD-10-CM | POA: Diagnosis not present

## 2021-09-30 DIAGNOSIS — D3502 Benign neoplasm of left adrenal gland: Secondary | ICD-10-CM | POA: Diagnosis not present

## 2021-09-30 DIAGNOSIS — J302 Other seasonal allergic rhinitis: Secondary | ICD-10-CM | POA: Diagnosis not present

## 2021-09-30 DIAGNOSIS — F5101 Primary insomnia: Secondary | ICD-10-CM | POA: Diagnosis not present

## 2021-09-30 DIAGNOSIS — G43909 Migraine, unspecified, not intractable, without status migrainosus: Secondary | ICD-10-CM | POA: Diagnosis not present

## 2021-09-30 MED ORDER — AZELEX 20 % EX CREA
TOPICAL_CREAM | CUTANEOUS | 3 refills | Status: DC
  Filled 2021-09-30: qty 50, 30d supply, fill #0

## 2021-10-01 ENCOUNTER — Other Ambulatory Visit (HOSPITAL_COMMUNITY): Payer: Self-pay

## 2021-10-06 ENCOUNTER — Other Ambulatory Visit (HOSPITAL_COMMUNITY): Payer: Self-pay

## 2021-10-13 ENCOUNTER — Encounter: Payer: Self-pay | Admitting: Neurology

## 2021-10-13 ENCOUNTER — Other Ambulatory Visit (HOSPITAL_COMMUNITY): Payer: Self-pay

## 2021-10-13 ENCOUNTER — Ambulatory Visit (INDEPENDENT_AMBULATORY_CARE_PROVIDER_SITE_OTHER): Payer: Medicare Other | Admitting: Neurology

## 2021-10-13 VITALS — BP 178/56 | HR 83 | Ht 69.0 in | Wt 169.2 lb

## 2021-10-13 DIAGNOSIS — G44021 Chronic cluster headache, intractable: Secondary | ICD-10-CM | POA: Diagnosis not present

## 2021-10-13 DIAGNOSIS — G40109 Localization-related (focal) (partial) symptomatic epilepsy and epileptic syndromes with simple partial seizures, not intractable, without status epilepticus: Secondary | ICD-10-CM

## 2021-10-13 DIAGNOSIS — C719 Malignant neoplasm of brain, unspecified: Secondary | ICD-10-CM | POA: Diagnosis not present

## 2021-10-13 MED ORDER — NURTEC 75 MG PO TBDP
ORAL_TABLET | ORAL | 11 refills | Status: DC
  Filled 2021-10-13: qty 10, 30d supply, fill #0
  Filled 2021-11-16: qty 8, 30d supply, fill #1
  Filled 2021-12-24: qty 8, 30d supply, fill #2
  Filled 2022-01-20: qty 8, 30d supply, fill #3
  Filled 2022-02-17: qty 8, 30d supply, fill #4
  Filled 2022-03-17: qty 8, 30d supply, fill #5

## 2021-10-13 MED ORDER — GABAPENTIN 300 MG PO CAPS
ORAL_CAPSULE | ORAL | 11 refills | Status: DC
  Filled 2021-10-13: qty 270, fill #0
  Filled 2021-10-20: qty 270, 30d supply, fill #0
  Filled 2021-11-23: qty 270, 30d supply, fill #1
  Filled 2021-12-24: qty 270, 30d supply, fill #2
  Filled 2022-01-20: qty 270, 30d supply, fill #3
  Filled 2022-02-17: qty 270, 30d supply, fill #4
  Filled 2022-03-17: qty 270, 30d supply, fill #5
  Filled 2022-04-26: qty 270, 30d supply, fill #6

## 2021-10-13 MED ORDER — EMGALITY 120 MG/ML ~~LOC~~ SOAJ
1.0000 | SUBCUTANEOUS | 11 refills | Status: DC
Start: 2021-10-13 — End: 2022-05-16
  Filled 2021-10-13: qty 2, 60d supply, fill #0
  Filled 2021-11-16: qty 2, 60d supply, fill #1
  Filled 2022-01-28: qty 1, 30d supply, fill #2

## 2021-10-13 MED ORDER — DIVALPROEX SODIUM ER 250 MG PO TB24
ORAL_TABLET | ORAL | 11 refills | Status: DC
  Filled 2021-10-13: qty 180, 30d supply, fill #0
  Filled 2021-11-16: qty 180, 30d supply, fill #1
  Filled 2021-12-24: qty 180, 30d supply, fill #2
  Filled 2022-01-20: qty 180, 30d supply, fill #3
  Filled 2022-02-17: qty 180, 30d supply, fill #4
  Filled 2022-03-17: qty 180, 30d supply, fill #5
  Filled 2022-04-26: qty 180, 30d supply, fill #6

## 2021-10-13 NOTE — Progress Notes (Signed)
Medication Samples have been provided to the patient. ? ?Drug name: nurtec       Strength: 75        Qty: 2  LOT: [id number]  Exp.Date: 08/2023 ? ?Dosing instructions: take one tablet as needed only one in 24 hours  ? ?The patient has been instructed regarding the correct time, dose, and frequency of taking this medication, including desired effects and most common side effects.  ? ?Victoria Bates ?2:26 PM ?10/13/2021  ?

## 2021-10-13 NOTE — Patient Instructions (Signed)
Good to see you. ? ?Increase Gabapentin '300mg'$ : Take 3 capsules three times a day ? ?2. Restart Emgality injections every month ? ?3. Continue Depakote '250mg'$ : Take 2 capsules three times a day ? ?4. Refills for Nurtec sent in ? ?5. Stop the Zomig ? ?6. Follow-up in 6 months, call for any changes ? ? ?Seizure Precautions: ?1. If medication has been prescribed for you to prevent seizures, take it exactly as directed.  Do not stop taking the medicine without talking to your doctor first, even if you have not had a seizure in a long time.  ? ?2. Avoid activities in which a seizure would cause danger to yourself or to others.  Don't operate dangerous machinery, swim alone, or climb in high or dangerous places, such as on ladders, roofs, or girders.  Do not drive unless your doctor says you may. ? ?3. If you have any warning that you may have a seizure, lay down in a safe place where you can't hurt yourself.   ? ?4.  No driving for 6 months from last seizure, as per [facility].   Please refer to the following link on the [place] website for more information: http://[email or web address]/[email or web address]/drivingu.cfm  ? ?5.  Maintain good sleep hygiene. Avoid alcohol. ? ?6.  Notify your neurology if you are planning pregnancy or if you become pregnant. ? ?7.  Contact your doctor if you have any problems that may be related to the medicine you are taking. ? ?8.  Call 911 and bring the patient back to the ED if: ?      ? A.  The seizure lasts longer than 5 minutes.      ? B.  The patient doesn't awaken shortly after the seizure ? C.  The patient has new problems such as difficulty seeing, speaking or moving ? D.  The patient was injured during the seizure ? E.  The patient has a temperature over 102 F (39C) ? F.  The patient vomited and now is having trouble breathing ?      ? ?

## 2021-10-13 NOTE — Progress Notes (Signed)
? ?NEUROLOGY FOLLOW UP OFFICE NOTE ? ?Victoria Bates ?[id number] ?[birth year] ? ?HISTORY OF PRESENT ILLNESS: ?I had the pleasure of seeing Victoria Bates in follow-up in the neurology clinic on 10/13/2021.  The patient was last seen 8 months ago. Records and images were personally reviewed where available.  Since her last visit, she has seen her neuro-oncologist at [facility] and had repeat MRI brain with and without contrast in January 2023 with no acute changes, there was similar sequela of prior resection of right frontal lobe with subjacent FLAIR signal, no evidence of recurrence of progression. Per Dr. Georgina Snell note, continue Depakote '750mg'$  BID. We had discussed spreading out her medications to take them TID, she takes the Depakote '250mg'$  2 tabs TID and Gabapentin '300mg'$  3-2-3. She states this is working much better for her, she is not as sleepy as taking 3 of Depakote BID. When asked about seizures, she states it had been quite a while since she had a bad one, but then she had one over the weekend where she lost consciousness. She thinks it was related to stress, overdoing things, and sleep deprivation. She has been dealing with her father, and notes that her face has been jerking and speech off since Saturday. She states she needs to rest, "this is not going to calm down until I rest," but she does not have the time right now to rest. She is on a tapering schedule for the Ativan, she states "Dr. Alvy Bimler wanted to ask about increasing the Gabapentin" as she gets off the Ativan, and her back doctor said it would help as well. She continues to deal with back pain and had injections 3 weeks ago. She shows pictures of her face breaking out both times she took Zomig for migraine recently. She had been on Emgality, she recalls taking 3-4 doses but was told to stop it, she does not recall who told her (not our office), she does not recall any side effects on it.  ? ? 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 She was admitted to [facility] last 09/19/2013 due to left vision changes and left facial heaviness. She reports that she had a 60-lb unintentional weight loss 4 months leading up to her admission, she was vomiting after every meal. She started having episodes where her hands would have low amplitude tremors and she feels the right lower face twitching. She would need to pause and take a minute to get words out. If she did not stop what she was doing, the symptoms could last for hours. She would feel like her body was "going 90 mph" with palpitations up to 200 bpm. She was started on Xanax for anxiety, but this did not help. She then started having vision changes on her left eye, she would park crookedly when she thought she was going straight. She saw an ophthalmologist, eye exam was normal but left optic neuritis was questioned and brain imaging recommended. She then started to have left facial heaviness and numbness on the left face, arm, and leg, and drove herself to [facility] where she was found to have a right frontal lesion. She underwent subtotal resection on 01/23/14 by Dr. Christella Noa and was started on Keppra for seizure prophylaxis at that time. She continued to have the recurrent episodes of tremors in both arms, palpitations, and right facial tightness, as well as episodes of left  face, neck, shoulder, and arm numbness, going down the left thumb and index finger. She describes her major seizures as "all of that times 100." After the episodes, she would be tired for a few days, wanting to sleep, with no energy. She has noticed that her short term memory is "gone." Keppra dose had been increased up to 2 grams daily, and this had caused a change in personality. She had seen neurologist Dr. Leonie Man, who switched her to Depakote. She tapered off Keppra and is currently on Depakote ER '500mg'$  BID with  significant improvement in symptoms. She denies any side effects to the Depakote.  ?  ?She has a history of recurrent headaches since her teenage years. Headaches are usually over the right temporal and occipital region, with sharp stabbing and throbbing pain, with a knot in her left neck region. There is occasional nausea, photo, and phonophobia. She was having these headaches around 1-2 times a week when younger, however since December 2014, headaches have been occurring around 2-3 times a week. She had been receiving injections in her neck for this. If she takes medication at the onset, they last a few hours, however they may last up to several days. She has found that taking Decadron for a couple of days has been the only helpful medication. She takes Fioricet rarely. Zomig, aspirin, and Tylenol were ineffective in the past. Her father and 2 year old son have headaches. She denies any diplopia, dysarthria, dysphagia, bowel/bladder dysfunction. She has chronic back pain and had been taking Soma qid for several years, with worsening of pain when she tried reducing this. She was started on Gabapentin for the seizures and back pain, she is unsure if this helps. She had been taking Ultram for the pain, and once stopping this, she noticed an improvement in the seizures as well.   ?  ?Diagnostic Data: Routine EEG done at [facility] reported amplitude asymmetry in the right frontotemporal regions which is likely from breach artifact from prior craniotomy. There is intermittent right frontal sharp activity but no definite epileptiform activity. Impression: Mild right hemispheric focal cortical irritability but no definitive epileptiform activity is noted.    ? ?Her 48-hour EEG did not show any epileptiform discharges. It showed breach artifact consistent with prior surgery. She reported numerous symptoms including headache, severe muscle spasm under the base of her skull on the right side, tremors in both hands, jerking and  pause in jaw (right side), left leg/inner thigh and left foot warm and pigmented, eyes twitching, fatigue, with no associated epileptiform correlate seen. ? ?Inpatient video EEG monitoring for 3 days at [facility], June 23 to 25, 2016: Depakote and Gabapentin were held. Typical events were not clearly captured, however she reported 2 spells where she reported dizziness and visual changes (tiles on floor looked uneven) with no EEG correlate.  ?Baseline EEG showed breach rhythm over the right hemisphere, no epileptiform discharges or electrographic seizures.  ? ?73-EEG at [facility] done 02/14/20 to 02/17/20 with baseline EEG showing breach rhythm over the right frontocentral region, no epileptiform discharges. There were numerous events and push buttons for headaches, dizziness, jerking jaw, shakes, tremors, jerk in speech, word loss, vision/depth perception changes, with no EEG correlate seen.  ? ?I personally reviewed MRI brain with and without contrast done 03/13/14 which showed post-op right-sided craniotomy. The surgical cavity in the right frontal lobe is filled with fluid, with note of improvement in layering blood products since prior study. The study of 01/24/2014 showed restricted  diffusion deep to the tumor. This is consistent with acute infarct which is resolving at this time. No acute infarct is present on today's study. No evidence of recurrent or residual tumor although the original tumor did not enhance. No mass effect or edema is present that would suggest recurrent tumor. Small amount of subarachnoid hemorrhage on the right again noted. ? ?MRI 11/2015 which I personally reviewed, there is similar pattern of enhancement and abnormal FLAIR signal in the right frontal gyrus immediately superior to the resection, stable from previous scan.   ? ?Brain MRI with and without contrast done 11/18/2020 and 07/2021 showed similar post-treatment changes s/p right frontal lobe oligodendroglioma debulking, no evidence of  recurrent or progressive tumor. ?  ?Epilepsy Risk Factors:  Right frontal oligodendroglioma s/p subtotal resection. Otherwise she had a normal birth and early development.  There is no history of febrile convulsions

## 2021-10-14 ENCOUNTER — Telehealth: Payer: Self-pay

## 2021-10-14 ENCOUNTER — Other Ambulatory Visit (HOSPITAL_COMMUNITY): Payer: Self-pay

## 2021-10-14 ENCOUNTER — Other Ambulatory Visit: Payer: Self-pay | Admitting: Hematology and Oncology

## 2021-10-14 DIAGNOSIS — C719 Malignant neoplasm of brain, unspecified: Secondary | ICD-10-CM

## 2021-10-14 MED ORDER — LORAZEPAM 0.5 MG PO TABS
0.5000 mg | ORAL_TABLET | Freq: Two times a day (BID) | ORAL | 0 refills | Status: DC | PRN
Start: 2021-10-14 — End: 2022-01-20
  Filled 2021-10-14: qty 60, 30d supply, fill #0

## 2021-10-14 MED ORDER — HYDROCODONE-ACETAMINOPHEN 10-325 MG PO TABS
1.0000 | ORAL_TABLET | Freq: Three times a day (TID) | ORAL | 0 refills | Status: DC | PRN
  Filled 2021-10-14: qty 90, 30d supply, fill #0

## 2021-10-14 NOTE — Telephone Encounter (Signed)
Patient Advocate Encounter ? ?Prior Authorization for [employer] '120mg'$ /ml auto-injectors has been approved.   ? ?PA#  [id number] ? ?Effective dates: 09/14/21 until further notice ? ?Per Test Claim Patients co-pay is $4.30. (30 DS) ? ?Spoke with Pharmacy to Process. ? ?Patient Advocate ?Fax: [phone]  ?

## 2021-10-14 NOTE — Telephone Encounter (Signed)
Patient Advocate Encounter ?  ?Received notification from [facility] that prior authorization for Emgality '120mg'$ /ml auto-injectors is required by his/her insurance [employer]. ?  ?PA submitted on 10/14/21 ? ?Key#: [id number] ? ?Status is pending ?   ?[facility] will continue to follow: ? ?Patient Advocate ?Fax: [phone]  ?

## 2021-10-15 ENCOUNTER — Other Ambulatory Visit: Payer: Self-pay | Admitting: Hematology and Oncology

## 2021-10-15 ENCOUNTER — Other Ambulatory Visit (HOSPITAL_COMMUNITY): Payer: Self-pay

## 2021-10-15 NOTE — Telephone Encounter (Signed)
Per note in chart.  Dr Catha Nottingham is watching her refills closely.  Last filled on 09/17/21

## 2021-10-18 ENCOUNTER — Other Ambulatory Visit (HOSPITAL_COMMUNITY): Payer: Self-pay

## 2021-10-18 MED ORDER — BUTALBITAL-APAP-CAFFEINE 50-325-40 MG PO TABS
1.0000 | ORAL_TABLET | Freq: Three times a day (TID) | ORAL | 0 refills | Status: DC | PRN
  Filled 2021-10-18: qty 90, 30d supply, fill #0

## 2021-10-19 ENCOUNTER — Other Ambulatory Visit (HOSPITAL_COMMUNITY): Payer: Self-pay

## 2021-10-20 ENCOUNTER — Other Ambulatory Visit (HOSPITAL_COMMUNITY): Payer: Self-pay

## 2021-10-21 ENCOUNTER — Other Ambulatory Visit (HOSPITAL_COMMUNITY): Payer: Self-pay

## 2021-10-21 DIAGNOSIS — M5416 Radiculopathy, lumbar region: Secondary | ICD-10-CM | POA: Diagnosis not present

## 2021-10-21 MED ORDER — NABUMETONE 750 MG PO TABS
750.0000 mg | ORAL_TABLET | Freq: Two times a day (BID) | ORAL | 1 refills | Status: DC | PRN
  Filled 2021-10-21: qty 20, 10d supply, fill #0
  Filled 2021-11-05: qty 20, 10d supply, fill #1

## 2021-10-22 ENCOUNTER — Other Ambulatory Visit (HOSPITAL_COMMUNITY): Payer: Self-pay

## 2021-10-25 ENCOUNTER — Other Ambulatory Visit (HOSPITAL_COMMUNITY): Payer: Self-pay

## 2021-10-29 ENCOUNTER — Other Ambulatory Visit (HOSPITAL_COMMUNITY): Payer: Self-pay

## 2021-10-29 MED ORDER — CYCLOBENZAPRINE HCL 10 MG PO TABS
ORAL_TABLET | ORAL | 1 refills | Status: DC
  Filled 2021-10-29: qty 30, 10d supply, fill #0
  Filled 2021-11-08: qty 30, 10d supply, fill #1

## 2021-11-03 DIAGNOSIS — M5416 Radiculopathy, lumbar region: Secondary | ICD-10-CM | POA: Diagnosis not present

## 2021-11-04 ENCOUNTER — Other Ambulatory Visit (HOSPITAL_COMMUNITY): Payer: Self-pay

## 2021-11-05 ENCOUNTER — Other Ambulatory Visit (HOSPITAL_COMMUNITY): Payer: Self-pay

## 2021-11-08 ENCOUNTER — Other Ambulatory Visit (HOSPITAL_COMMUNITY): Payer: Self-pay

## 2021-11-10 ENCOUNTER — Other Ambulatory Visit: Payer: Self-pay | Admitting: Hematology and Oncology

## 2021-11-10 DIAGNOSIS — C719 Malignant neoplasm of brain, unspecified: Secondary | ICD-10-CM

## 2021-11-11 ENCOUNTER — Other Ambulatory Visit (HOSPITAL_COMMUNITY): Payer: Self-pay

## 2021-11-11 MED ORDER — HYDROCODONE-ACETAMINOPHEN 10-325 MG PO TABS
1.0000 | ORAL_TABLET | Freq: Three times a day (TID) | ORAL | 0 refills | Status: DC | PRN
  Filled 2021-11-11: qty 90, 30d supply, fill #0

## 2021-11-16 ENCOUNTER — Other Ambulatory Visit: Payer: Self-pay | Admitting: Hematology and Oncology

## 2021-11-16 ENCOUNTER — Other Ambulatory Visit (HOSPITAL_COMMUNITY): Payer: Self-pay

## 2021-11-16 MED ORDER — BUTALBITAL-APAP-CAFFEINE 50-325-40 MG PO TABS
1.0000 | ORAL_TABLET | Freq: Three times a day (TID) | ORAL | 0 refills | Status: DC | PRN
Start: 2021-11-16 — End: 2021-12-14
  Filled 2021-11-16: qty 90, 30d supply, fill #0

## 2021-11-23 ENCOUNTER — Other Ambulatory Visit (HOSPITAL_COMMUNITY): Payer: Self-pay

## 2021-11-23 MED ORDER — CYCLOBENZAPRINE HCL 10 MG PO TABS
ORAL_TABLET | ORAL | 1 refills | Status: DC
  Filled 2021-11-23: qty 30, 10d supply, fill #0
  Filled 2021-12-01: qty 30, 10d supply, fill #1

## 2021-11-25 DIAGNOSIS — M5126 Other intervertebral disc displacement, lumbar region: Secondary | ICD-10-CM | POA: Diagnosis not present

## 2021-11-25 DIAGNOSIS — M5416 Radiculopathy, lumbar region: Secondary | ICD-10-CM | POA: Diagnosis not present

## 2021-12-01 ENCOUNTER — Other Ambulatory Visit (HOSPITAL_COMMUNITY): Payer: Self-pay

## 2021-12-01 MED ORDER — NABUMETONE 750 MG PO TABS
750.0000 mg | ORAL_TABLET | Freq: Two times a day (BID) | ORAL | 1 refills | Status: DC | PRN
  Filled 2021-12-01: qty 20, 10d supply, fill #0
  Filled 2022-02-07: qty 20, 10d supply, fill #1

## 2021-12-04 ENCOUNTER — Other Ambulatory Visit (HOSPITAL_COMMUNITY): Payer: Self-pay

## 2021-12-08 ENCOUNTER — Other Ambulatory Visit: Payer: Self-pay | Admitting: Hematology and Oncology

## 2021-12-08 DIAGNOSIS — C719 Malignant neoplasm of brain, unspecified: Secondary | ICD-10-CM

## 2021-12-09 ENCOUNTER — Other Ambulatory Visit (HOSPITAL_COMMUNITY): Payer: Self-pay

## 2021-12-09 MED ORDER — HYDROCODONE-ACETAMINOPHEN 10-325 MG PO TABS
1.0000 | ORAL_TABLET | Freq: Three times a day (TID) | ORAL | 0 refills | Status: DC | PRN
  Filled 2021-12-09: qty 90, 30d supply, fill #0

## 2021-12-10 ENCOUNTER — Other Ambulatory Visit (HOSPITAL_COMMUNITY): Payer: Self-pay

## 2021-12-10 MED ORDER — CYCLOBENZAPRINE HCL 10 MG PO TABS
ORAL_TABLET | ORAL | 1 refills | Status: DC
  Filled 2021-12-10: qty 30, 10d supply, fill #0
  Filled 2022-01-06: qty 30, 10d supply, fill #1

## 2021-12-14 ENCOUNTER — Other Ambulatory Visit (HOSPITAL_COMMUNITY): Payer: Self-pay

## 2021-12-14 ENCOUNTER — Other Ambulatory Visit: Payer: Self-pay | Admitting: Hematology and Oncology

## 2021-12-15 ENCOUNTER — Other Ambulatory Visit (HOSPITAL_COMMUNITY): Payer: Self-pay

## 2021-12-16 ENCOUNTER — Other Ambulatory Visit (HOSPITAL_COMMUNITY): Payer: Self-pay

## 2021-12-16 MED ORDER — BUTALBITAL-APAP-CAFFEINE 50-325-40 MG PO TABS
1.0000 | ORAL_TABLET | Freq: Three times a day (TID) | ORAL | 0 refills | Status: DC | PRN
  Filled 2021-12-16: qty 90, 30d supply, fill #0

## 2021-12-24 ENCOUNTER — Other Ambulatory Visit: Payer: Self-pay | Admitting: Hematology and Oncology

## 2021-12-24 ENCOUNTER — Other Ambulatory Visit (HOSPITAL_COMMUNITY): Payer: Self-pay

## 2021-12-24 DIAGNOSIS — C711 Malignant neoplasm of frontal lobe: Secondary | ICD-10-CM

## 2021-12-24 DIAGNOSIS — R4702 Dysphasia: Secondary | ICD-10-CM

## 2021-12-24 DIAGNOSIS — R0789 Other chest pain: Secondary | ICD-10-CM

## 2021-12-24 DIAGNOSIS — R519 Headache, unspecified: Secondary | ICD-10-CM

## 2021-12-24 DIAGNOSIS — R110 Nausea: Secondary | ICD-10-CM

## 2021-12-24 DIAGNOSIS — R202 Paresthesia of skin: Secondary | ICD-10-CM

## 2021-12-24 MED ORDER — CARISOPRODOL 350 MG PO TABS
350.0000 mg | ORAL_TABLET | Freq: Three times a day (TID) | ORAL | 0 refills | Status: DC
  Filled 2021-12-24 – 2021-12-28 (×2): qty 270, 90d supply, fill #0

## 2021-12-27 ENCOUNTER — Telehealth: Payer: Self-pay

## 2021-12-27 ENCOUNTER — Encounter: Payer: Self-pay | Admitting: Hematology and Oncology

## 2021-12-27 NOTE — Telephone Encounter (Signed)
-----   Message from Heath Lark, MD sent at 12/27/2021  7:17 AM EDT ----- I told her I will see her every 6 months for medication review I want to make sure her husband is present throughout the visit 30 mins appt Can you call and ask when is best day/time to see her and schedule?

## 2021-12-27 NOTE — Telephone Encounter (Signed)
Called and left below message. Ask her to call the office back. 

## 2021-12-28 ENCOUNTER — Other Ambulatory Visit (HOSPITAL_COMMUNITY): Payer: Self-pay

## 2022-01-06 ENCOUNTER — Other Ambulatory Visit: Payer: Self-pay | Admitting: Hematology and Oncology

## 2022-01-06 ENCOUNTER — Other Ambulatory Visit (HOSPITAL_COMMUNITY): Payer: Self-pay

## 2022-01-06 DIAGNOSIS — C719 Malignant neoplasm of brain, unspecified: Secondary | ICD-10-CM

## 2022-01-06 MED ORDER — HYDROCODONE-ACETAMINOPHEN 10-325 MG PO TABS
1.0000 | ORAL_TABLET | Freq: Three times a day (TID) | ORAL | 0 refills | Status: DC | PRN
  Filled 2022-01-06: qty 90, 30d supply, fill #0

## 2022-01-11 ENCOUNTER — Other Ambulatory Visit (HOSPITAL_COMMUNITY): Payer: Self-pay

## 2022-01-12 ENCOUNTER — Other Ambulatory Visit: Payer: Self-pay | Admitting: Hematology and Oncology

## 2022-01-13 ENCOUNTER — Other Ambulatory Visit (HOSPITAL_COMMUNITY): Payer: Self-pay

## 2022-01-13 MED ORDER — BUTALBITAL-APAP-CAFFEINE 50-325-40 MG PO TABS
1.0000 | ORAL_TABLET | Freq: Three times a day (TID) | ORAL | 0 refills | Status: DC | PRN
  Filled 2022-01-13: qty 90, 30d supply, fill #0

## 2022-01-14 ENCOUNTER — Other Ambulatory Visit (HOSPITAL_COMMUNITY): Payer: Self-pay

## 2022-01-14 DIAGNOSIS — R000 Tachycardia, unspecified: Secondary | ICD-10-CM | POA: Diagnosis not present

## 2022-01-14 DIAGNOSIS — R21 Rash and other nonspecific skin eruption: Secondary | ICD-10-CM | POA: Diagnosis not present

## 2022-01-14 DIAGNOSIS — R251 Tremor, unspecified: Secondary | ICD-10-CM | POA: Diagnosis not present

## 2022-01-14 MED ORDER — CLINDAMYCIN PHOSPHATE 1 % EX GEL
CUTANEOUS | 0 refills | Status: DC
  Filled 2022-01-14 – 2022-01-20 (×2): qty 60, 30d supply, fill #0

## 2022-01-14 MED ORDER — METRONIDAZOLE 1 % EX GEL
1.0000 | Freq: Every day | CUTANEOUS | 0 refills | Status: DC
  Filled 2022-01-14 – 2022-04-26 (×2): qty 60, 30d supply, fill #0

## 2022-01-14 MED ORDER — PROPRANOLOL HCL 10 MG PO TABS
ORAL_TABLET | ORAL | 0 refills | Status: DC
  Filled 2022-01-14: qty 60, 30d supply, fill #0

## 2022-01-17 ENCOUNTER — Other Ambulatory Visit (HOSPITAL_COMMUNITY): Payer: Self-pay

## 2022-01-17 MED ORDER — DOXYCYCLINE HYCLATE 100 MG PO CAPS
ORAL_CAPSULE | ORAL | 0 refills | Status: DC
  Filled 2022-01-17: qty 20, 10d supply, fill #0

## 2022-01-20 ENCOUNTER — Other Ambulatory Visit: Payer: Self-pay | Admitting: Hematology and Oncology

## 2022-01-20 ENCOUNTER — Other Ambulatory Visit (HOSPITAL_COMMUNITY): Payer: Self-pay

## 2022-01-20 MED ORDER — CYCLOBENZAPRINE HCL 10 MG PO TABS
ORAL_TABLET | ORAL | 1 refills | Status: DC
  Filled 2022-01-20: qty 30, 10d supply, fill #0
  Filled 2022-01-28: qty 30, 10d supply, fill #1

## 2022-01-21 ENCOUNTER — Other Ambulatory Visit (HOSPITAL_COMMUNITY): Payer: Self-pay

## 2022-01-21 MED ORDER — LORAZEPAM 0.5 MG PO TABS
0.5000 mg | ORAL_TABLET | Freq: Two times a day (BID) | ORAL | 0 refills | Status: DC | PRN
  Filled 2022-01-21: qty 60, 30d supply, fill #0

## 2022-01-25 ENCOUNTER — Other Ambulatory Visit (HOSPITAL_COMMUNITY): Payer: Self-pay

## 2022-01-28 ENCOUNTER — Other Ambulatory Visit (HOSPITAL_COMMUNITY): Payer: Self-pay

## 2022-01-28 DIAGNOSIS — R000 Tachycardia, unspecified: Secondary | ICD-10-CM | POA: Diagnosis not present

## 2022-01-28 DIAGNOSIS — R350 Frequency of micturition: Secondary | ICD-10-CM | POA: Diagnosis not present

## 2022-01-28 DIAGNOSIS — R21 Rash and other nonspecific skin eruption: Secondary | ICD-10-CM | POA: Diagnosis not present

## 2022-01-28 DIAGNOSIS — R61 Generalized hyperhidrosis: Secondary | ICD-10-CM | POA: Diagnosis not present

## 2022-01-28 DIAGNOSIS — R251 Tremor, unspecified: Secondary | ICD-10-CM | POA: Diagnosis not present

## 2022-01-28 DIAGNOSIS — D3502 Benign neoplasm of left adrenal gland: Secondary | ICD-10-CM | POA: Diagnosis not present

## 2022-01-28 MED ORDER — METRONIDAZOLE 1 % EX GEL
CUTANEOUS | 0 refills | Status: DC
  Filled 2022-01-28: qty 60, 10d supply, fill #0

## 2022-01-28 MED ORDER — DOXYCYCLINE HYCLATE 100 MG PO CAPS
ORAL_CAPSULE | ORAL | 0 refills | Status: DC
  Filled 2022-01-28: qty 20, 10d supply, fill #0

## 2022-01-28 MED ORDER — PROPRANOLOL HCL 20 MG PO TABS
ORAL_TABLET | ORAL | 0 refills | Status: DC
  Filled 2022-01-28: qty 180, 90d supply, fill #0

## 2022-02-02 ENCOUNTER — Other Ambulatory Visit (HOSPITAL_COMMUNITY): Payer: Self-pay

## 2022-02-02 ENCOUNTER — Other Ambulatory Visit: Payer: Self-pay | Admitting: Hematology and Oncology

## 2022-02-02 DIAGNOSIS — C719 Malignant neoplasm of brain, unspecified: Secondary | ICD-10-CM

## 2022-02-02 MED ORDER — HYDROCODONE-ACETAMINOPHEN 10-325 MG PO TABS
1.0000 | ORAL_TABLET | Freq: Three times a day (TID) | ORAL | 0 refills | Status: DC | PRN
  Filled 2022-02-02 – 2022-02-03 (×2): qty 90, 30d supply, fill #0

## 2022-02-02 MED ORDER — METRONIDAZOLE 0.75 % EX GEL
CUTANEOUS | 0 refills | Status: DC
  Filled 2022-02-02: qty 45, 30d supply, fill #0

## 2022-02-02 MED ORDER — METRONIDAZOLE 1 % EX GEL
CUTANEOUS | 0 refills | Status: DC
  Filled 2022-02-02: qty 60, 10d supply, fill #0

## 2022-02-03 ENCOUNTER — Other Ambulatory Visit (HOSPITAL_COMMUNITY): Payer: Self-pay

## 2022-02-07 ENCOUNTER — Other Ambulatory Visit (HOSPITAL_COMMUNITY): Payer: Self-pay

## 2022-02-07 MED ORDER — CYCLOBENZAPRINE HCL 10 MG PO TABS
ORAL_TABLET | ORAL | 1 refills | Status: DC
  Filled 2022-02-07: qty 30, 10d supply, fill #0
  Filled 2022-02-17: qty 30, 10d supply, fill #1

## 2022-02-07 MED ORDER — CLINDAMYCIN PHOSPHATE 1 % EX GEL
CUTANEOUS | 0 refills | Status: DC
  Filled 2022-02-07 (×2): qty 60, 30d supply, fill #0

## 2022-02-07 MED ORDER — DOXYCYCLINE HYCLATE 100 MG PO CAPS
ORAL_CAPSULE | ORAL | 0 refills | Status: DC
  Filled 2022-02-07: qty 20, 10d supply, fill #0

## 2022-02-10 ENCOUNTER — Other Ambulatory Visit: Payer: Self-pay | Admitting: Hematology and Oncology

## 2022-02-10 ENCOUNTER — Other Ambulatory Visit (HOSPITAL_COMMUNITY): Payer: Self-pay

## 2022-02-10 MED ORDER — BUTALBITAL-APAP-CAFFEINE 50-325-40 MG PO TABS
1.0000 | ORAL_TABLET | Freq: Three times a day (TID) | ORAL | 0 refills | Status: DC | PRN
  Filled 2022-02-10: qty 90, 30d supply, fill #0

## 2022-02-10 MED ORDER — BUTALBITAL-APAP-CAFFEINE 50-325-40 MG PO TABS: 1.0000 | ORAL_TABLET | Freq: Three times a day (TID) | ORAL | 0 refills | Status: DC | PRN

## 2022-02-14 DIAGNOSIS — D3502 Benign neoplasm of left adrenal gland: Secondary | ICD-10-CM | POA: Diagnosis not present

## 2022-02-14 DIAGNOSIS — R251 Tremor, unspecified: Secondary | ICD-10-CM | POA: Diagnosis not present

## 2022-02-14 DIAGNOSIS — R000 Tachycardia, unspecified: Secondary | ICD-10-CM | POA: Diagnosis not present

## 2022-02-14 DIAGNOSIS — R002 Palpitations: Secondary | ICD-10-CM | POA: Diagnosis not present

## 2022-02-14 DIAGNOSIS — Z92241 Personal history of systemic steroid therapy: Secondary | ICD-10-CM | POA: Diagnosis not present

## 2022-02-14 DIAGNOSIS — I471 Supraventricular tachycardia: Secondary | ICD-10-CM | POA: Diagnosis not present

## 2022-02-14 DIAGNOSIS — Z87898 Personal history of other specified conditions: Secondary | ICD-10-CM | POA: Diagnosis not present

## 2022-02-17 ENCOUNTER — Other Ambulatory Visit (HOSPITAL_COMMUNITY): Payer: Self-pay

## 2022-02-17 DIAGNOSIS — Z92241 Personal history of systemic steroid therapy: Secondary | ICD-10-CM | POA: Diagnosis not present

## 2022-02-17 DIAGNOSIS — Z87898 Personal history of other specified conditions: Secondary | ICD-10-CM | POA: Diagnosis not present

## 2022-02-17 MED ORDER — PROPRANOLOL HCL 10 MG PO TABS
ORAL_TABLET | ORAL | 0 refills | Status: DC
  Filled 2022-02-17: qty 60, 30d supply, fill #0

## 2022-02-24 ENCOUNTER — Ambulatory Visit (INDEPENDENT_AMBULATORY_CARE_PROVIDER_SITE_OTHER): Payer: Medicare Other | Admitting: Interventional Cardiology

## 2022-02-24 ENCOUNTER — Encounter: Payer: Self-pay | Admitting: Interventional Cardiology

## 2022-02-24 ENCOUNTER — Ambulatory Visit: Payer: Medicare Other | Admitting: Interventional Cardiology

## 2022-02-24 VITALS — BP 122/60 | HR 91 | Ht 69.0 in | Wt 164.0 lb

## 2022-02-24 DIAGNOSIS — R002 Palpitations: Secondary | ICD-10-CM | POA: Diagnosis not present

## 2022-02-24 DIAGNOSIS — R0602 Shortness of breath: Secondary | ICD-10-CM

## 2022-02-24 NOTE — Progress Notes (Signed)
Cardiology Office Note   Date:  02/24/2022   ID:  Victoria, Bates October 24, 1980, MRN [id number]  PCP:  Lavone Orn, MD    No chief complaint on file.  palpitations  Wt Readings from Last 3 Encounters:  02/24/22 164 lb (74.4 kg)  10/13/21 169 lb 3.2 oz (76.7 kg)  08/09/21 155 lb 3.2 oz (70.4 kg)       History of Present Illness: ANIAH PAULI is a 41 y.o. female  Who had a brain malignancy treated with chemotherapy and surgery.  I last saw her in 2018.     In 2018 in 2019, "she has had issues with orthostatic hypotension.  She has severe dizziness with standing.     She has had issues with low cortisol and seizures as well.  IShe states it is not primary Addisons, but a secondary Addisons.  She will have a different scan of her brain.    She describes chest pressure that is worse in the heat.  Worse with stress as well.     She states that she has had spells of AFib in the past, but I cannot find this documented."  She also had issues with orthostatic dizziness and palpitations and chest pain in 2018.  She had a 48-hour monitor and an echocardiogram which were unremarkable.  She also seemed quite anxious at the time.  Over the past few months, she has had palpitations. Worse in June 2023. She had an episode in the MDs office that she had high HR while lyig down for "30 minutes."  GOing from sitting to standing will cause high HR.  She has a pulse ox and notes rates of 140-210.  She states she feels that 180s bpm are frequent reading.  She has SHOB when her HR is increased. She belches a lot when the HR is increased.   Dr. Buddy Duty placed a 2 week Zio patch, but she feels the sx are better after her propranolol has been increased.   Portacath was removed in 2019.  She has an adrenal tumor. She has had to take "cortisone" but has not needed it since 2020.   Denies : Chest pain.  Leg edema. Nitroglycerin use. Orthopnea.  Paroxysmal nocturnal dyspnea. Syncope.     Past  Medical History:  Diagnosis Date   Adrenal tumor    Anemia    Anxiety    Asthma    very mild- no inhaler use since July 2013   Brain tumor [facility])    Hx: of   Cancer ([place]) 2003   skin   Chronic fatigue 10/03/2014   Cognitive impairment 01/27/4579   Complication of anesthesia    takes longer for patient to go under anesthesia   Dizziness    Dysphasia 02/12/2014   Glioma, malignant ([place]) dx'd 09/2013   Headache 02/12/2014   Headache(784.0)    migraines   Herniated disc    Hot flashes 10/03/2014   Infection    to right head incision   Lactation disorder 10/03/2014   Liver hemangioma    Liver lesion 08/22/2014   Nausea alone 02/12/2014   Neuropathic pain 04/08/2014   Petit mal seizure status ([place])    Seizures ([place])    petit mal serizures most days    Past Surgical History:  Procedure Laterality Date   ABDOMINAL HYSTERECTOMY     CESAREAN SECTION     COLONOSCOPY WITH PROPOFOL N/A 08/29/2012   Procedure: COLONOSCOPY WITH PROPOFOL;  Surgeon: Arta Silence, MD;  Location: [facility];  Service: Endoscopy;  Laterality: N/A;   coposcopy  08/06/2012   cervical biopsy   CRANIOTOMY Right 09/26/2013   Procedure: Awake Right frontal open brain biopsy;  Surgeon: Winfield Cunas, MD;  Location: [place] [facility];  Service: Neurosurgery;  Laterality: Right;  awake Right frontal open brain biopsy   CRANIOTOMY N/A 10/30/2013   Procedure: debridement of cranial wound  right temporal incision, ;  Surgeon: Winfield Cunas, MD;  Location: [place];  Service: Neurosurgery;  Laterality: N/A;   CRANIOTOMY N/A 01/23/2014   Procedure: Awake Craniotomy with BrainLab;  Surgeon: Winfield Cunas, MD;  Location: [place] [facility];  Service: Neurosurgery;  Laterality: N/A;  Awake Craniotomy  tumor with BrainLab   ESOPHAGOGASTRODUODENOSCOPY (EGD) WITH PROPOFOL N/A 08/29/2012   Procedure: ESOPHAGOGASTRODUODENOSCOPY (EGD) WITH PROPOFOL;  Surgeon: Arta Silence, MD;  Location: [facility];  Service: Endoscopy;  Laterality: N/A;    EYE SURGERY     6 surgeries   IR RADIOLOGY PERIPHERAL GUIDED IV START  12/28/2017   IR REMOVAL TUN ACCESS W/ PORT W/O FL MOD SED  12/28/2017   IR US GUIDE VASC ACCESS LEFT  12/28/2017   left knee surgery  07/18/1998   torn meniscus   right flank surgery  07/19/2003   bnign mass from flank area-right   ROBOTIC ASSISTED TOTAL HYSTERECTOMY WITH BILATERAL SALPINGO OOPHERECTOMY Bilateral 09/25/2012   Procedure: ROBOTIC ASSISTED TOTAL HYSTERECTOMY WITH BILATERAL SALPINGO OOPHORECTOMY;  Surgeon: Maeola Sarah. Landry Mellow, MD;  Location: [place] [facility];  Service: Gynecology;  Laterality: Bilateral;   ROTATOR CUFF REPAIR     WISDOM TOOTH EXTRACTION       Current Outpatient Medications  Medication Sig Dispense Refill   albuterol (VENTOLIN HFA) 108 (90 Base) MCG/ACT inhaler Inhale 2 puffs into the lungs every 4 (four) hours as needed for up to 30 days for Shortness of Breath. 18 g 0   aspirin 325 MG tablet Take 325 mg by mouth daily.     butalbital-acetaminophen-caffeine (FIORICET) 50-325-40 MG tablet Take 1 tablet by mouth 3 times daily as needed for headache. 90 tablet 0   carisoprodol (SOMA) 350 MG tablet Take 1 tablet by mouth 3 times daily. 270 tablet 0   clindamycin (CLINDAGEL) 1 % gel Apply to affected area 2 times a day 60 g 0   cyclobenzaprine (FLEXERIL) 10 MG tablet Take 1 tablet by mouth 3 times a day as needed for pain and spasms 30 tablet 1   divalproex (DEPAKOTE ER) 250 MG 24 hr tablet Take 2 tablets by mouth three times daily. 180 tablet 11   doxycycline (VIBRAMYCIN) 100 MG capsule Take 1 capsule by mouth twice a day for 10 days 20 capsule 0   EPINEPHRINE 0.3 mg/0.3 mL IJ SOAJ injection INJECT 0.3 MLS (ONE SYRINGE) INTO THE MUSCLE ONCE FOR 1 DOSE AS NEEDED. (Patient taking differently: Inject 0.3 mg into the muscle once.) 0.3 mL 0   gabapentin (NEURONTIN) 300 MG capsule Take 3 capsules in morning, 3 capsules at noon,  and 3 capsules at bedtime 270 capsule 11   Galcanezumab-gnlm (EMGALITY) 120 MG/ML SOAJ  Inject 1 pen into the skin every 30 (thirty) days. 1.12 mL 11   HYDROcodone-acetaminophen (NORCO) 10-325 MG tablet TAKE 1 TABLET BY MOUTH 3 TIMES DAILY AS NEEDED 90 tablet 0   LORazepam (ATIVAN) 0.5 MG tablet Take 1 tablet (0.5 mg total) by mouth 2 (two) times daily as needed for anxiety. 60 tablet 0   metroNIDAZOLE (METROGEL) 1 % gel Apply to  affected area daily 60 g 0   nabumetone (RELAFEN) 750 MG tablet Take 1 tablet by mouth twice a day as needed for pain 20 tablet 1   propranolol (INDERAL) 20 MG tablet Take 1 tablet by mouth as needed, if no effect after 1 hour take 2nd tablet 180 tablet 0   Rimegepant Sulfate (NURTEC) 75 MG TBDP Dissolve 1 tablet in mouth at onset of migraine. Do not take more than 1 in a 24hour period 10 tablet 11   No current facility-administered medications for this visit.    Allergies:   Adhesive [tape], Soma [carisoprodol], Tramadol hcl, Latex, Medical adhesive remover, Percocet [oxycodone-acetaminophen], Prednisone, Shellfish allergy, Topamax [topiramate], Vancomycin, Zofran [ondansetron hcl], and Zomig [zolmitriptan]    Social History:  The patient  reports that she has never smoked. She has never used smokeless tobacco. She reports that she does not drink alcohol and does not use drugs.   Family History:  The patient's family history includes Breast cancer in her maternal aunt and maternal aunt; Hypertension in her mother; Melanoma in her maternal uncle; Other in her son; Ovarian cancer in her maternal grandmother; Prostate cancer (age of onset: 40) in her maternal uncle; Skin cancer in her father and mother; Thyroid cancer in her father.    ROS:  Please see the history of present illness.   Otherwise, review of systems are positive for palpitations.   All other systems are reviewed and negative.    PHYSICAL EXAM: VS:  BP 122/60   Pulse 91   Ht '5\' 9"'$  (1.753 m)   Wt 164 lb (74.4 kg)   LMP 09/20/2012   SpO2 99%   BMI 24.22 kg/m  , BMI Body mass index is  24.22 kg/m. GEN: Well nourished, well developed, in no acute distress HEENT: normal Neck: no JVD, carotid bruits, or masses Cardiac: RRR; no murmurs, rubs, or gallops,no edema  Respiratory:  clear to auscultation bilaterally, normal work of breathing GI: soft, nontender, nondistended, + BS MS: no deformity or atrophy Skin: warm and dry, no rash Neuro:  Strength and sensation are intact Psych: euthymic mood, full affect   EKG:   The ekg ordered today demonstrates NSR, no ST changes   Recent Labs: No results found for requested labs within last 365 days.   Lipid Panel    Component Value Date/Time   CHOL 166 09/20/2013 0450   TRIG 57 09/20/2013 0450   HDL 77 09/20/2013 0450   CHOLHDL 2.2 09/20/2013 0450   VLDL 11 09/20/2013 0450   LDLCALC 78 09/20/2013 0450     Other studies Reviewed: Additional studies/ records that were reviewed today with results demonstrating: labs reviewed, Hbg 13.6 in 6/23.   ASSESSMENT AND PLAN:  Palpitations: She is wearing a 2 weeks Zio patch.  Her heart rates have improved with the propranolol.  Will await final reading of the Zio patch.  I spoke to her about the importance of staying well-hydrated.  She does have some positional changes.  She could have a POTS-like syndrome.  Hydration and careful changing positions will be important. Dyspnea on exertion: Will check echocardiogram.  Last echo was 5 years ago.    Current medicines are reviewed at length with the patient today.  The patient concerns regarding her medicines were addressed.  The following changes have been made:  No change  Labs/ tests ordered today include: echo No orders of the defined types were placed in this encounter.   Recommend 150 minutes/week of aerobic exercise Low  fat, low carb, high fiber diet recommended  Disposition:   FU in based on Zio patch results and echo   Signed, Larae Grooms, MD  02/24/2022 9:19 AM    [facility] [address], [place], [place]  [address] Phone: [phone]; Fax: [phone]

## 2022-02-24 NOTE — Patient Instructions (Signed)
Medication Instructions:  ?Your physician recommends that you continue on your current medications as directed. Please refer to the Current Medication list given to you today. ? ?*If you need a refill on your cardiac medications before your next appointment, please call your pharmacy* ? ? ?Lab Work: ?none ?If you have labs (blood work) drawn today and your tests are completely normal, you will receive your results only by: ?MyChart Message (if you have MyChart) OR ?A paper copy in the mail ?If you have any lab test that is abnormal or we need to change your treatment, we will call you to review the results. ? ? ?Testing/Procedures: ?Your physician has requested that you have an echocardiogram. Echocardiography is a painless test that uses sound waves to create images of your heart. It provides your doctor with information about the size and shape of your heart and how well your heart?s chambers and valves are working. This procedure takes approximately one hour. There are no restrictions for this procedure. ? ? ? ?Follow-Up: ?At [facility], you and your health needs are our priority.  As part of our continuing mission to provide you with exceptional heart care, we have created designated Provider Care Teams.  These Care Teams include your primary Cardiologist (physician) and Advanced Practice Providers (APPs -  Physician Assistants and Nurse Practitioners) who all work together to provide you with the care you need, when you need it. ? ?We recommend signing up for the patient portal called "MyChart".  Sign up information is provided on this After Visit Summary.  MyChart is used to connect with patients for Virtual Visits (Telemedicine).  Patients are able to view lab/test results, encounter notes, upcoming appointments, etc.  Non-urgent messages can be sent to your provider as well.   ?To learn more about what you can do with MyChart, go to [email or web address].   ? ?Your next appointment:   ?As needed ? ?The  format for your next appointment:   ?In Person ? ?Provider:   ?Jayadeep Varanasi, MD   ? ? ?Other Instructions ?  ? ?Important Information About Sugar ? ? ? ? ? ? ?

## 2022-02-28 ENCOUNTER — Other Ambulatory Visit (HOSPITAL_COMMUNITY): Payer: Self-pay

## 2022-03-03 ENCOUNTER — Other Ambulatory Visit: Payer: Self-pay | Admitting: Hematology and Oncology

## 2022-03-03 DIAGNOSIS — C719 Malignant neoplasm of brain, unspecified: Secondary | ICD-10-CM

## 2022-03-04 ENCOUNTER — Other Ambulatory Visit (HOSPITAL_COMMUNITY): Payer: Self-pay

## 2022-03-04 MED ORDER — HYDROCODONE-ACETAMINOPHEN 10-325 MG PO TABS
1.0000 | ORAL_TABLET | Freq: Three times a day (TID) | ORAL | 0 refills | Status: DC | PRN
  Filled 2022-03-04: qty 90, 30d supply, fill #0

## 2022-03-07 DIAGNOSIS — R002 Palpitations: Secondary | ICD-10-CM | POA: Diagnosis not present

## 2022-03-08 ENCOUNTER — Other Ambulatory Visit: Payer: Self-pay | Admitting: Hematology and Oncology

## 2022-03-08 ENCOUNTER — Other Ambulatory Visit (HOSPITAL_COMMUNITY): Payer: Self-pay

## 2022-03-08 ENCOUNTER — Ambulatory Visit (HOSPITAL_COMMUNITY): Payer: Medicare Other

## 2022-03-08 DIAGNOSIS — R002 Palpitations: Secondary | ICD-10-CM | POA: Diagnosis not present

## 2022-03-08 DIAGNOSIS — I471 Supraventricular tachycardia: Secondary | ICD-10-CM | POA: Diagnosis not present

## 2022-03-08 MED ORDER — BUTALBITAL-APAP-CAFFEINE 50-325-40 MG PO TABS
1.0000 | ORAL_TABLET | Freq: Three times a day (TID) | ORAL | 0 refills | Status: DC | PRN
  Filled 2022-03-08 (×2): qty 90, 30d supply, fill #0

## 2022-03-08 MED ORDER — CYCLOBENZAPRINE HCL 10 MG PO TABS
ORAL_TABLET | ORAL | 1 refills | Status: DC
  Filled 2022-03-08: qty 30, 10d supply, fill #0
  Filled 2022-03-25: qty 30, 10d supply, fill #1

## 2022-03-08 MED ORDER — NABUMETONE 750 MG PO TABS
ORAL_TABLET | ORAL | 1 refills | Status: DC
  Filled 2022-03-08: qty 60, 30d supply, fill #0

## 2022-03-09 ENCOUNTER — Telehealth: Payer: Self-pay | Admitting: *Deleted

## 2022-03-09 DIAGNOSIS — M542 Cervicalgia: Secondary | ICD-10-CM | POA: Diagnosis not present

## 2022-03-09 NOTE — Telephone Encounter (Signed)
Patient called to ask about changing appt with Dr. Alvy Bimler currently scheduled on Monday 8/28.  She said Dr. Alvy Bimler wanted to see her after she had MRI and say Dr. Clovis Riley at  [facility]. MRI is now scheduled for 9/1 and she will now see Dr. Clovis Riley on 9/15. If ok with Dr. Alvy Bimler, she would like to r/s appt here for week of 9/18. Advised her that message will be sent to Dr. Alvy Bimler for review.

## 2022-03-10 ENCOUNTER — Other Ambulatory Visit (HOSPITAL_COMMUNITY): Payer: Self-pay

## 2022-03-10 DIAGNOSIS — Z92241 Personal history of systemic steroid therapy: Secondary | ICD-10-CM | POA: Diagnosis not present

## 2022-03-10 DIAGNOSIS — D3502 Benign neoplasm of left adrenal gland: Secondary | ICD-10-CM | POA: Diagnosis not present

## 2022-03-10 DIAGNOSIS — I471 Supraventricular tachycardia: Secondary | ICD-10-CM | POA: Diagnosis not present

## 2022-03-10 MED ORDER — CYCLOBENZAPRINE HCL 10 MG PO TABS
10.0000 mg | ORAL_TABLET | Freq: Three times a day (TID) | ORAL | 1 refills | Status: DC
  Filled 2022-03-10: qty 30, 10d supply, fill #0
  Filled 2022-04-04: qty 30, 10d supply, fill #1

## 2022-03-10 MED ORDER — NABUMETONE 750 MG PO TABS
750.0000 mg | ORAL_TABLET | Freq: Two times a day (BID) | ORAL | 1 refills | Status: DC
Start: 2022-03-10 — End: 2022-06-27
  Filled 2022-03-10 – 2022-04-04 (×2): qty 20, 10d supply, fill #0
  Filled 2022-04-11: qty 20, 10d supply, fill #1

## 2022-03-10 NOTE — Telephone Encounter (Signed)
Scheduling message sent to move appt to the week of 9/18.

## 2022-03-10 NOTE — Telephone Encounter (Signed)
Hi Victoria Bates,  Please send LOS to change her appt, pls request 30 mins, no labs

## 2022-03-11 ENCOUNTER — Telehealth: Payer: Self-pay | Admitting: Hematology and Oncology

## 2022-03-11 NOTE — Telephone Encounter (Signed)
Per 8/25 phone line pt called to r/s appointment r/s per pt request

## 2022-03-14 ENCOUNTER — Inpatient Hospital Stay: Payer: Medicare Other | Admitting: Hematology and Oncology

## 2022-03-14 ENCOUNTER — Other Ambulatory Visit (HOSPITAL_COMMUNITY): Payer: Self-pay

## 2022-03-14 DIAGNOSIS — K2930 Chronic superficial gastritis without bleeding: Secondary | ICD-10-CM | POA: Diagnosis not present

## 2022-03-14 DIAGNOSIS — M5030 Other cervical disc degeneration, unspecified cervical region: Secondary | ICD-10-CM | POA: Diagnosis not present

## 2022-03-14 DIAGNOSIS — J4520 Mild intermittent asthma, uncomplicated: Secondary | ICD-10-CM | POA: Diagnosis not present

## 2022-03-14 DIAGNOSIS — I7300 Raynaud's syndrome without gangrene: Secondary | ICD-10-CM | POA: Diagnosis not present

## 2022-03-14 DIAGNOSIS — G43909 Migraine, unspecified, not intractable, without status migrainosus: Secondary | ICD-10-CM | POA: Diagnosis not present

## 2022-03-14 DIAGNOSIS — F419 Anxiety disorder, unspecified: Secondary | ICD-10-CM | POA: Diagnosis not present

## 2022-03-14 DIAGNOSIS — F988 Other specified behavioral and emotional disorders with onset usually occurring in childhood and adolescence: Secondary | ICD-10-CM | POA: Diagnosis not present

## 2022-03-14 DIAGNOSIS — E559 Vitamin D deficiency, unspecified: Secondary | ICD-10-CM | POA: Diagnosis not present

## 2022-03-14 DIAGNOSIS — I10 Essential (primary) hypertension: Secondary | ICD-10-CM | POA: Diagnosis not present

## 2022-03-14 DIAGNOSIS — M5136 Other intervertebral disc degeneration, lumbar region: Secondary | ICD-10-CM | POA: Diagnosis not present

## 2022-03-14 DIAGNOSIS — G40909 Epilepsy, unspecified, not intractable, without status epilepticus: Secondary | ICD-10-CM | POA: Diagnosis not present

## 2022-03-14 DIAGNOSIS — I471 Supraventricular tachycardia: Secondary | ICD-10-CM | POA: Diagnosis not present

## 2022-03-14 MED ORDER — EPINEPHRINE 0.3 MG/0.3ML IJ SOAJ
INTRAMUSCULAR | 1 refills | Status: DC
  Filled 2022-03-14: qty 2, 30d supply, fill #0
  Filled 2022-04-11: qty 2, 30d supply, fill #1

## 2022-03-14 MED ORDER — ALBUTEROL SULFATE HFA 108 (90 BASE) MCG/ACT IN AERS
INHALATION_SPRAY | RESPIRATORY_TRACT | 0 refills | Status: DC
  Filled 2022-03-14: qty 6.7, 30d supply, fill #0

## 2022-03-15 DIAGNOSIS — M542 Cervicalgia: Secondary | ICD-10-CM | POA: Diagnosis not present

## 2022-03-16 ENCOUNTER — Other Ambulatory Visit (HOSPITAL_COMMUNITY): Payer: Self-pay

## 2022-03-17 ENCOUNTER — Other Ambulatory Visit: Payer: Self-pay | Admitting: Hematology and Oncology

## 2022-03-17 ENCOUNTER — Other Ambulatory Visit (HOSPITAL_COMMUNITY): Payer: Self-pay

## 2022-03-17 DIAGNOSIS — C719 Malignant neoplasm of brain, unspecified: Secondary | ICD-10-CM

## 2022-03-17 DIAGNOSIS — C711 Malignant neoplasm of frontal lobe: Secondary | ICD-10-CM

## 2022-03-17 DIAGNOSIS — R110 Nausea: Secondary | ICD-10-CM

## 2022-03-17 DIAGNOSIS — R0789 Other chest pain: Secondary | ICD-10-CM

## 2022-03-17 DIAGNOSIS — R202 Paresthesia of skin: Secondary | ICD-10-CM

## 2022-03-17 DIAGNOSIS — R4702 Dysphasia: Secondary | ICD-10-CM

## 2022-03-17 DIAGNOSIS — M542 Cervicalgia: Secondary | ICD-10-CM | POA: Diagnosis not present

## 2022-03-17 DIAGNOSIS — R519 Headache, unspecified: Secondary | ICD-10-CM

## 2022-03-17 MED ORDER — LORAZEPAM 0.5 MG PO TABS
0.5000 mg | ORAL_TABLET | Freq: Two times a day (BID) | ORAL | 0 refills | Status: DC | PRN
  Filled 2022-03-17: qty 60, 30d supply, fill #0

## 2022-03-17 MED ORDER — CARISOPRODOL 350 MG PO TABS
350.0000 mg | ORAL_TABLET | Freq: Three times a day (TID) | ORAL | 0 refills | Status: DC
  Filled 2022-03-17 – 2022-03-31 (×2): qty 270, 90d supply, fill #0

## 2022-03-17 MED ORDER — CLINDAMYCIN PHOSPHATE 1 % EX GEL
CUTANEOUS | 0 refills | Status: DC
  Filled 2022-03-17: qty 60, 30d supply, fill #0

## 2022-03-18 ENCOUNTER — Other Ambulatory Visit (HOSPITAL_COMMUNITY): Payer: Self-pay

## 2022-03-18 DIAGNOSIS — C711 Malignant neoplasm of frontal lobe: Secondary | ICD-10-CM | POA: Diagnosis not present

## 2022-03-22 ENCOUNTER — Ambulatory Visit (HOSPITAL_COMMUNITY): Payer: Medicare Other | Attending: Interventional Cardiology

## 2022-03-22 DIAGNOSIS — R002 Palpitations: Secondary | ICD-10-CM

## 2022-03-22 DIAGNOSIS — R0602 Shortness of breath: Secondary | ICD-10-CM | POA: Diagnosis not present

## 2022-03-22 LAB — ECHOCARDIOGRAM COMPLETE
Area-P 1/2: 5.13 cm2
S' Lateral: 3.2 cm

## 2022-03-24 ENCOUNTER — Other Ambulatory Visit (HOSPITAL_COMMUNITY): Payer: Self-pay

## 2022-03-25 ENCOUNTER — Telehealth: Payer: Self-pay | Admitting: *Deleted

## 2022-03-25 ENCOUNTER — Other Ambulatory Visit (HOSPITAL_COMMUNITY): Payer: Self-pay

## 2022-03-25 NOTE — Telephone Encounter (Signed)
-----   Message from Jettie Booze, MD sent at 03/23/2022  5:01 PM EDT ----- Normal biventricular function without evidence of hemodynamically significant valvular heart disease.

## 2022-03-25 NOTE — Telephone Encounter (Signed)
Patient notified.  Patient made aware Dr Irish Lack reviewed monitor results from [facility] and he recommends she continue propranolol.  Patient will follow up with Dr Irish Lack as needed.

## 2022-03-31 ENCOUNTER — Other Ambulatory Visit (HOSPITAL_COMMUNITY): Payer: Self-pay

## 2022-03-31 ENCOUNTER — Other Ambulatory Visit: Payer: Self-pay | Admitting: Hematology and Oncology

## 2022-03-31 DIAGNOSIS — C719 Malignant neoplasm of brain, unspecified: Secondary | ICD-10-CM

## 2022-03-31 MED ORDER — HYDROCODONE-ACETAMINOPHEN 10-325 MG PO TABS
1.0000 | ORAL_TABLET | Freq: Three times a day (TID) | ORAL | 0 refills | Status: DC | PRN
  Filled 2022-03-31 – 2022-04-01 (×2): qty 90, 30d supply, fill #0

## 2022-04-01 ENCOUNTER — Other Ambulatory Visit (HOSPITAL_COMMUNITY): Payer: Self-pay

## 2022-04-01 DIAGNOSIS — Z85841 Personal history of malignant neoplasm of brain: Secondary | ICD-10-CM | POA: Diagnosis not present

## 2022-04-01 DIAGNOSIS — R202 Paresthesia of skin: Secondary | ICD-10-CM | POA: Diagnosis not present

## 2022-04-01 DIAGNOSIS — G8929 Other chronic pain: Secondary | ICD-10-CM | POA: Diagnosis not present

## 2022-04-01 DIAGNOSIS — R569 Unspecified convulsions: Secondary | ICD-10-CM | POA: Diagnosis not present

## 2022-04-01 DIAGNOSIS — G43909 Migraine, unspecified, not intractable, without status migrainosus: Secondary | ICD-10-CM | POA: Diagnosis not present

## 2022-04-01 DIAGNOSIS — H169 Unspecified keratitis: Secondary | ICD-10-CM | POA: Diagnosis not present

## 2022-04-01 DIAGNOSIS — Z8041 Family history of malignant neoplasm of ovary: Secondary | ICD-10-CM | POA: Diagnosis not present

## 2022-04-01 DIAGNOSIS — C711 Malignant neoplasm of frontal lobe: Secondary | ICD-10-CM | POA: Diagnosis not present

## 2022-04-01 DIAGNOSIS — Z08 Encounter for follow-up examination after completed treatment for malignant neoplasm: Secondary | ICD-10-CM | POA: Diagnosis not present

## 2022-04-01 DIAGNOSIS — Z79899 Other long term (current) drug therapy: Secondary | ICD-10-CM | POA: Diagnosis not present

## 2022-04-04 ENCOUNTER — Other Ambulatory Visit: Payer: Self-pay | Admitting: Hematology and Oncology

## 2022-04-04 ENCOUNTER — Other Ambulatory Visit (HOSPITAL_COMMUNITY): Payer: Self-pay

## 2022-04-04 DIAGNOSIS — M5412 Radiculopathy, cervical region: Secondary | ICD-10-CM | POA: Diagnosis not present

## 2022-04-05 ENCOUNTER — Other Ambulatory Visit: Payer: Self-pay

## 2022-04-05 ENCOUNTER — Inpatient Hospital Stay: Payer: Medicare Other | Attending: Hematology and Oncology | Admitting: Hematology and Oncology

## 2022-04-05 ENCOUNTER — Encounter: Payer: Self-pay | Admitting: Hematology and Oncology

## 2022-04-05 ENCOUNTER — Other Ambulatory Visit (HOSPITAL_COMMUNITY): Payer: Self-pay

## 2022-04-05 MED ORDER — BUTALBITAL-APAP-CAFFEINE 50-325-40 MG PO TABS
1.0000 | ORAL_TABLET | Freq: Three times a day (TID) | ORAL | 0 refills | Status: DC | PRN
  Filled 2022-04-05: qty 90, 30d supply, fill #0

## 2022-04-11 ENCOUNTER — Other Ambulatory Visit (HOSPITAL_COMMUNITY): Payer: Self-pay

## 2022-04-11 MED ORDER — CYCLOBENZAPRINE HCL 10 MG PO TABS
10.0000 mg | ORAL_TABLET | Freq: Three times a day (TID) | ORAL | 1 refills | Status: DC | PRN
  Filled 2022-04-11: qty 30, fill #0
  Filled 2022-04-20: qty 30, 10d supply, fill #0

## 2022-04-12 ENCOUNTER — Other Ambulatory Visit (HOSPITAL_COMMUNITY): Payer: Self-pay

## 2022-04-12 MED ORDER — PROPRANOLOL HCL 20 MG PO TABS
ORAL_TABLET | ORAL | 0 refills | Status: DC
  Filled 2022-04-12: qty 180, 90d supply, fill #0

## 2022-04-12 MED ORDER — DOXYCYCLINE HYCLATE 100 MG PO CAPS
100.0000 mg | ORAL_CAPSULE | Freq: Two times a day (BID) | ORAL | 0 refills | Status: DC
  Filled 2022-04-12: qty 20, 10d supply, fill #0

## 2022-04-12 MED ORDER — ALBUTEROL SULFATE HFA 108 (90 BASE) MCG/ACT IN AERS
INHALATION_SPRAY | RESPIRATORY_TRACT | 0 refills | Status: DC
  Filled 2022-04-12: qty 6.7, 30d supply, fill #0

## 2022-04-13 ENCOUNTER — Other Ambulatory Visit: Payer: Self-pay

## 2022-04-13 ENCOUNTER — Ambulatory Visit
Admission: EM | Admit: 2022-04-13 | Discharge: 2022-04-13 | Disposition: A | Discharge status: Home / Self Care | Payer: Medicare Other

## 2022-04-13 ENCOUNTER — Encounter (HOSPITAL_BASED_OUTPATIENT_CLINIC_OR_DEPARTMENT_OTHER): Payer: Self-pay

## 2022-04-13 ENCOUNTER — Encounter: Payer: Self-pay | Admitting: Emergency Medicine

## 2022-04-13 ENCOUNTER — Emergency Department (HOSPITAL_BASED_OUTPATIENT_CLINIC_OR_DEPARTMENT_OTHER): Payer: Medicare Other

## 2022-04-13 ENCOUNTER — Emergency Department (HOSPITAL_BASED_OUTPATIENT_CLINIC_OR_DEPARTMENT_OTHER): Payer: Medicare Other | Admitting: Radiology

## 2022-04-13 ENCOUNTER — Emergency Department (HOSPITAL_BASED_OUTPATIENT_CLINIC_OR_DEPARTMENT_OTHER)
Admission: EM | Admit: 2022-04-13 | Discharge: 2022-04-14 | Disposition: A | Discharge status: Home / Self Care | Payer: Medicare Other | Attending: Emergency Medicine | Admitting: Emergency Medicine

## 2022-04-13 DIAGNOSIS — G43819 Other migraine, intractable, without status migrainosus: Secondary | ICD-10-CM | POA: Diagnosis not present

## 2022-04-13 DIAGNOSIS — R1013 Epigastric pain: Secondary | ICD-10-CM | POA: Diagnosis not present

## 2022-04-13 DIAGNOSIS — Z91040 Latex allergy status: Secondary | ICD-10-CM | POA: Diagnosis not present

## 2022-04-13 DIAGNOSIS — M542 Cervicalgia: Secondary | ICD-10-CM

## 2022-04-13 DIAGNOSIS — I161 Hypertensive emergency: Secondary | ICD-10-CM | POA: Diagnosis not present

## 2022-04-13 DIAGNOSIS — J069 Acute upper respiratory infection, unspecified: Secondary | ICD-10-CM

## 2022-04-13 DIAGNOSIS — Z85828 Personal history of other malignant neoplasm of skin: Secondary | ICD-10-CM | POA: Insufficient documentation

## 2022-04-13 DIAGNOSIS — R059 Cough, unspecified: Secondary | ICD-10-CM | POA: Diagnosis not present

## 2022-04-13 DIAGNOSIS — Z7982 Long term (current) use of aspirin: Secondary | ICD-10-CM | POA: Insufficient documentation

## 2022-04-13 DIAGNOSIS — Z20822 Contact with and (suspected) exposure to covid-19: Secondary | ICD-10-CM | POA: Insufficient documentation

## 2022-04-13 DIAGNOSIS — R0981 Nasal congestion: Secondary | ICD-10-CM | POA: Diagnosis not present

## 2022-04-13 DIAGNOSIS — R051 Acute cough: Secondary | ICD-10-CM | POA: Insufficient documentation

## 2022-04-13 DIAGNOSIS — J029 Acute pharyngitis, unspecified: Secondary | ICD-10-CM | POA: Diagnosis not present

## 2022-04-13 DIAGNOSIS — J45909 Unspecified asthma, uncomplicated: Secondary | ICD-10-CM | POA: Diagnosis not present

## 2022-04-13 DIAGNOSIS — R109 Unspecified abdominal pain: Secondary | ICD-10-CM

## 2022-04-13 DIAGNOSIS — R1011 Right upper quadrant pain: Secondary | ICD-10-CM | POA: Insufficient documentation

## 2022-04-13 DIAGNOSIS — R112 Nausea with vomiting, unspecified: Secondary | ICD-10-CM | POA: Insufficient documentation

## 2022-04-13 DIAGNOSIS — R519 Headache, unspecified: Secondary | ICD-10-CM | POA: Diagnosis not present

## 2022-04-13 LAB — RESP PANEL BY RT-PCR (FLU A&B, COVID) ARPGX2
Influenza A by PCR: NEGATIVE
Influenza B by PCR: NEGATIVE
SARS Coronavirus 2 by RT PCR: NEGATIVE

## 2022-04-13 LAB — GROUP A STREP BY PCR: Group A Strep by PCR: NOT DETECTED

## 2022-04-13 MED ORDER — KETOROLAC TROMETHAMINE 15 MG/ML IJ SOLN
15.0000 mg | Freq: Once | INTRAMUSCULAR | Status: AC
  Administered 2022-04-14: 15 mg via INTRAVENOUS
  Filled 2022-04-13: qty 1

## 2022-04-13 MED ORDER — PROCHLORPERAZINE EDISYLATE 10 MG/2ML IJ SOLN
10.0000 mg | Freq: Once | INTRAMUSCULAR | Status: AC
  Administered 2022-04-14: 10 mg via INTRAVENOUS
  Filled 2022-04-13: qty 2

## 2022-04-13 MED ORDER — SODIUM CHLORIDE 0.9 % IV BOLUS
1000.0000 mL | Freq: Once | INTRAVENOUS | Status: AC
  Administered 2022-04-14: 1000 mL via INTRAVENOUS

## 2022-04-13 MED ORDER — DIPHENHYDRAMINE HCL 50 MG/ML IJ SOLN
12.5000 mg | Freq: Once | INTRAMUSCULAR | Status: DC
  Filled 2022-04-13: qty 1

## 2022-04-13 NOTE — ED Provider Notes (Signed)
[facility]    CSN: [id number] Arrival date & time: 04/13/22  1646      History   Chief Complaint Chief Complaint  Patient presents with   Chills   Cough   Nasal Congestion   Generalized Body Aches   Facial Pain   Sore Throat    HPI Victoria Bates is a 41 y.o. female.   Patient presents with several different chief complaints today.  Patient reports chills, cough, nasal congestion, generalized body aches, sore throat, migraine headache, neck pain, nausea with vomiting that started a few days prior.  Patient reports migraine headache is located on the right side of the head and radiates down to bilateral neck.  Patient does have history of migraines given that she is currently undergoing treatment for brain tumor.  Although, patient reports that this does not feel like her typical migraines as her neck pain is typically unilateral as opposed to bilateral.  Her husband has had similar symptoms recently.  She denies any known fevers at home.  Denies chest pain, shortness of breath, diarrhea, ear pain.  Also has significantly elevated blood pressure reading but reports that she has not taken any of her daily medications today given that she is not able to keep them down.  Patient also reports that she had some type of cervical neck procedure recently.   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   Headache 02/12/2014   Headache(784.0)    migraines   Herniated disc    Hot flashes 10/03/2014   Infection    to right head incision   Lactation disorder 10/03/2014   Liver hemangioma     Liver lesion 08/22/2014   Nausea alone 02/12/2014   Neuropathic pain 04/08/2014   Petit mal seizure status ([place])    Seizures ([place])    petit mal serizures most days    Patient Active Problem List   Diagnosis Date Noted   Left foot pain 02/04/2020   Dysuria 08/03/2018   Elevated BP without diagnosis of hypertension 08/03/2018   Chronic leukopenia 08/03/2018   Acute pain 04/18/2018   Polypharmacy 11/14/2017   Easy bruising 11/14/2017   Sinus infection 11/14/2017   Pulmonary nodules 08/18/2017   Vitamin D deficiency 07/19/2017   Vitamin B12 deficiency 07/19/2017   Cough, persistent 06/20/2017   Orthostatic dizziness 01/24/2017   Addison disease ([place]) 01/16/2017   Anxiety 01/16/2017   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   Goals of care, counseling/discussion 11/20/2016   Pancytopenia, acquired ([place]) 10/10/2016   Deficiency anemia 10/10/2016   Upper airway cough syndrome 01/28/2016   Cough variant asthma 01/28/2016   Dyspnea 01/28/2016   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   Constipation, acute 10/12/2015   Cognitive impairment 08/13/2015   Dysphagia, idiopathic 04/30/2015   Low serum cortisol level 10/20/2014   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, not intractable, without status epilepticus ([place]) 10/17/2014   Astrocytoma ([place]) 10/17/2014  Chronic fatigue 10/03/2014   Hot flashes 10/03/2014   Liver lesion 08/22/2014   Pancytopenia due to antineoplastic chemotherapy ([place]) 06/23/2014   Genetic testing 06/13/2014   Intractable chronic cluster headache 04/28/2014   Thrombocytopenia ([place]) 04/28/2014   Neuropathic pain 04/08/2014   Simple partial seizures ([place]) 03/20/2014   Sensory seizure ([place]) 03/20/2014   Dysphasia 02/12/2014   Headache 02/12/2014   Seizures ([place]) 02/12/2014   Financial difficulty 02/12/2014   CIN I (cervical intraepithelial neoplasia I) 02/12/2014   Anemia, unspecified  02/12/2014   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 09/26/2013   Paresthesias 09/20/2013   Blurred vision, left eye 09/20/2013   Asthma 07/24/2009   CHEST PAIN 07/24/2009    Past Surgical History:  Procedure Laterality Date   ABDOMINAL HYSTERECTOMY     CESAREAN SECTION     COLONOSCOPY WITH PROPOFOL N/A 08/29/2012   Procedure: COLONOSCOPY WITH PROPOFOL;  Surgeon: Arta Silence, MD;  Location: [facility];  Service: Endoscopy;  Laterality: N/A;   coposcopy  08/06/2012   cervical biopsy   CRANIOTOMY Right 09/26/2013   Procedure: Awake Right frontal open brain biopsy;  Surgeon: Winfield Cunas, MD;  Location: [facility];  Service: Neurosurgery;  Laterality: Right;  awake Right frontal open brain biopsy   CRANIOTOMY N/A 10/30/2013   Procedure: debridement of cranial wound  right temporal incision, ;  Surgeon: Winfield Cunas, MD;  Location: [facility];  Service: Neurosurgery;  Laterality: N/A;   CRANIOTOMY N/A 01/23/2014   Procedure: Awake Craniotomy with BrainLab;  Surgeon: Winfield Cunas, MD;  Location: [facility];  Service: Neurosurgery;  Laterality: N/A;  Awake Craniotomy  tumor with BrainLab   ESOPHAGOGASTRODUODENOSCOPY (EGD) WITH PROPOFOL N/A 08/29/2012   Procedure: ESOPHAGOGASTRODUODENOSCOPY (EGD) WITH PROPOFOL;  Surgeon: Arta Silence, MD;  Location: [facility];  Service: Endoscopy;  Laterality: N/A;   EYE SURGERY     6 surgeries   IR RADIOLOGY PERIPHERAL GUIDED IV START  12/28/2017   IR REMOVAL TUN ACCESS W/ PORT W/O FL MOD SED  12/28/2017   IR US GUIDE VASC ACCESS LEFT  12/28/2017   left knee surgery  07/18/1998   torn meniscus   right flank surgery  07/19/2003   bnign mass from flank area-right   ROBOTIC ASSISTED TOTAL HYSTERECTOMY WITH BILATERAL SALPINGO OOPHERECTOMY Bilateral 09/25/2012   Procedure: ROBOTIC ASSISTED TOTAL HYSTERECTOMY WITH BILATERAL SALPINGO OOPHORECTOMY;  Surgeon: Maeola Sarah. Landry Mellow, MD;   Location: [facility];  Service: Gynecology;  Laterality: Bilateral;   ROTATOR CUFF REPAIR     WISDOM TOOTH EXTRACTION      OB History   No obstetric history on file.      Home Medications    Prior to Admission medications   Medication Sig Start Date End Date Taking? Authorizing Provider  albuterol (VENTOLIN HFA) 108 (90 Base) MCG/ACT inhaler Inhale 2 puffs into the lungs every 4 (four) hours as needed for up to 30 days for Shortness of Breath. 12/01/20     albuterol (VENTOLIN HFA) 108 (90 Base) MCG/ACT inhaler Inhale 1 puff into the lungs every 4 hours as needed 04/12/22     aspirin 325 MG tablet Take 325 mg by mouth daily.    [provider]  butalbital-acetaminophen-caffeine (FIORICET) 50-325-40 MG tablet Take 1 tablet by mouth 3 times daily as needed for headache. 04/05/22   Heath Lark, MD  carisoprodol (SOMA) 350 MG tablet Take 1 tablet by mouth 3 times daily. 03/17/22  Heath Lark, MD  clindamycin (CLINDAGEL) 1 % gel Apply to affected area 2 times a day 03/17/22     cyclobenzaprine (FLEXERIL) 10 MG tablet Take 1 tablet by mouth 3 times a day as needed for pain and spasms 02/07/22     cyclobenzaprine (FLEXERIL) 10 MG tablet Take 1 tablet by mouth 3 times daily as needed for pain and spasms. 03/10/22     cyclobenzaprine (FLEXERIL) 10 MG tablet Take 1 tablet by mouth 3 times a day as needed for pain and spasms 04/11/22     divalproex (DEPAKOTE ER) 250 MG 24 hr tablet Take 2 tablets by mouth three times daily. 10/13/21   Cameron Sprang, MD  doxycycline (VIBRAMYCIN) 100 MG capsule Take 1 capsule (100 mg total) by mouth 2 (two) times daily for 10 days. 04/12/22     EPINEPHrine (EPIPEN 2-PAK) 0.3 mg/0.3 mL IJ SOAJ injection Inject as directed as needed for severe allergic reaction 03/14/22     EPINEPHRINE 0.3 mg/0.3 mL IJ SOAJ injection INJECT 0.3 MLS (ONE SYRINGE) INTO THE MUSCLE ONCE FOR 1 DOSE AS NEEDED. Patient taking differently: Inject 0.3 mg into the muscle once. 03/23/18   Kozlow, Donnamarie Poag, MD  gabapentin (NEURONTIN) 300 MG capsule Take 3 capsules in morning, 3 capsules at noon,  and 3 capsules at bedtime 10/13/21   Cameron Sprang, MD  Galcanezumab-gnlm [facility]) 120 MG/ML SOAJ Inject 1 pen into the skin every 30 (thirty) days. 10/13/21   Cameron Sprang, MD  HYDROcodone-acetaminophen (NORCO) 10-325 MG tablet TAKE 1 TABLET BY MOUTH 3 TIMES DAILY AS NEEDED 03/31/22 09/27/22  Heath Lark, MD  LORazepam (ATIVAN) 0.5 MG tablet Take 1 tablet by mouth 2 times daily as needed for anxiety. 03/17/22   Heath Lark, MD  metroNIDAZOLE (METROGEL) 1 % gel Apply to affected area daily 01/14/22     nabumetone (RELAFEN) 750 MG tablet Take 1 tablet by mouth twice a day as needed for pain 12/01/21     nabumetone (RELAFEN) 750 MG tablet Take 1 tablet by mouth twice a day with meals as needed for pain 03/08/22     nabumetone (RELAFEN) 750 MG tablet Take 1 tablet by mouth twice daily as needed for pain 03/10/22     propranolol (INDERAL) 20 MG tablet Take 1 tablet by mouth as needed, if no effect after 1 hour take 2nd tablet 04/12/22     Rimegepant Sulfate (NURTEC) 75 MG TBDP Dissolve 1 tablet in mouth at onset of migraine. Do not take more than 1 in a 24hour period 10/13/21   Cameron Sprang, MD    Family History Family History  Problem Relation Age of Onset   Hypertension Mother    Skin cancer Mother    Thyroid cancer Father    Skin cancer Father    Breast cancer Maternal Aunt        dx in her 13s   Prostate cancer Maternal Uncle 63   Melanoma Maternal Uncle    Ovarian cancer Maternal Grandmother        dx in her late 91s-40s   Breast cancer Maternal Aunt        bilateral breast cancer dx in 37s and 40s; possibly BRCA+   Other Son        multiple CAL spots, bright spots on brain MRI, ? chiari malformation    Social History Social History   Tobacco Use   Smoking status: Never   Smokeless tobacco: Never  Vaping Use  Vaping Use: Never used  Substance Use Topics   Alcohol use: No     Alcohol/week: 0.0 standard drinks of alcohol   Drug use: No     Allergies   Adhesive [tape], Soma [carisoprodol], Tramadol hcl, Latex, Medical adhesive remover, Percocet [oxycodone-acetaminophen], Prednisone, Shellfish allergy, Topamax [topiramate], Vancomycin, Zofran [ondansetron hcl], and Zomig [zolmitriptan]   Review of Systems Review of Systems Per HPI  Physical Exam Triage Vital Signs ED Triage Vitals  Enc Vitals Group     BP 04/13/22 1707 (!) 203/112     Pulse Rate 04/13/22 1707 96     Resp 04/13/22 1707 18     Temp 04/13/22 1707 98 F (36.7 C)     Temp src --      SpO2 04/13/22 1707 98 %     Weight --      Height --      Head Circumference --      Peak Flow --      Pain Score 04/13/22 1704 8     Pain Loc --      Pain Edu? --      Excl. in [place]? --    No data found.  Updated Vital Signs BP (!) 203/112   Pulse 96   Temp 98 F (36.7 C)   Resp 18   LMP 09/20/2012   SpO2 98%   Visual Acuity Right Eye Distance:   Left Eye Distance:   Bilateral Distance:    Right Eye Near:   Left Eye Near:    Bilateral Near:     Physical Exam Constitutional:      General: She is not in acute distress.    Appearance: Normal appearance. She is ill-appearing. She is not toxic-appearing or diaphoretic.  HENT:     Head: Normocephalic and atraumatic.     Right Ear: Tympanic membrane and ear canal normal.     Left Ear: Tympanic membrane and ear canal normal.     Nose: Congestion present.     Mouth/Throat:     Mouth: Mucous membranes are moist.     Pharynx: Posterior oropharyngeal erythema present.  Eyes:     Extraocular Movements: Extraocular movements intact.     Conjunctiva/sclera: Conjunctivae normal.     Pupils: Pupils are equal, round, and reactive to light.  Cardiovascular:     Rate and Rhythm: Normal rate and regular rhythm.     Pulses: Normal pulses.     Heart sounds: Normal heart sounds.  Pulmonary:     Effort: Pulmonary effort is normal. No respiratory  distress.     Breath sounds: Normal breath sounds. No stridor. No wheezing, rhonchi or rales.  Abdominal:     General: Abdomen is flat. Bowel sounds are normal.     Palpations: Abdomen is soft.  Musculoskeletal:        General: Normal range of motion.     Cervical back: Normal range of motion.  Skin:    General: Skin is warm and dry.  Neurological:     General: No focal deficit present.     Mental Status: She is alert and oriented to person, place, and time. Mental status is at baseline.     Cranial Nerves: Cranial nerves 2-12 are intact.     Sensory: Sensation is intact.     Motor: Motor function is intact.     Coordination: Coordination is intact.     Gait: Gait is intact.  Psychiatric:        Mood  and Affect: Mood normal.        Behavior: Behavior normal.      UC Treatments / Results  Labs (all labs ordered are listed, but only abnormal results are displayed) Labs Reviewed - No data to display  EKG   Radiology No results found.  Procedures Procedures (including critical care time)  Medications Ordered in UC Medications - No data to display  Initial Impression / Assessment and Plan / UC Course  I have reviewed the triage vital signs and the nursing notes.  Pertinent labs & imaging results that were available during my care of the patient were reviewed by me and considered in my medical decision making (see chart for details).     Patient has significantly elevated blood pressure reading and is very ill-appearing on exam.  Given associated neck pain with recent cervical neck procedure, extremely elevated blood pressure reading, and appearance of the patient on physical exam, I do think this warrants further evaluation and management at the emergency department.  Patient was agreeable to going to the emergency department.  Suggested EMS transport but patient declined.  Risks associated with not going by EMS were discussed with patient.  Patient voiced understanding  but wished for her husband to transport her.  Patient left via her husband transporting her to the hospital. Final Clinical Impressions(s) / UC Diagnoses   Final diagnoses:  Hypertensive emergency  Neck pain  Acute upper respiratory infection  Other migraine without status migrainosus, intractable  Sore throat  Acute cough     Discharge Instructions      Please go to the emergency department as soon as you leave urgent care for further evaluation and management.    ED Prescriptions   None    PDMP not reviewed this encounter.   Teodora Medici, [place] 04/13/22 [id number]

## 2022-04-13 NOTE — ED Notes (Signed)
Patient is being discharged from the Urgent Care and sent to the Emergency Department via self . Per Hildred Alamin, patient is in need of higher level of care due to HTN. Patient is aware and verbalizes understanding of plan of care.  Vitals:   04/13/22 1707  BP: (!) 203/112  Pulse: 96  Resp: 18  Temp: 98 F (36.7 C)  SpO2: 98%

## 2022-04-13 NOTE — ED Triage Notes (Signed)
Pt is present today with c/o nasal congestion, chest congestion, body aches, sore throat, and cough. Pt sx started x6 days ago

## 2022-04-13 NOTE — Discharge Instructions (Signed)
Please go to the emergency department as soon as you leave urgent care for further evaluation and management. ?

## 2022-04-13 NOTE — ED Triage Notes (Signed)
Patient here POV from UC.  Endorses Cough, Sore Throat, Body Aches for approximately 5 Days.   Sent by UC for Assessment due to Elevated BP and due to having recent Injections into C-Spine.   Anxious during Triage. A&Ox4. GCS 15. Ambulatory.

## 2022-04-14 ENCOUNTER — Other Ambulatory Visit (HOSPITAL_COMMUNITY): Payer: Self-pay

## 2022-04-14 DIAGNOSIS — R051 Acute cough: Secondary | ICD-10-CM | POA: Diagnosis not present

## 2022-04-14 LAB — COMPREHENSIVE METABOLIC PANEL
ALT: 11 U/L (ref 0–44)
AST: 11 U/L — ABNORMAL LOW (ref 15–41)
Albumin: 4.1 g/dL (ref 3.5–5.0)
Alkaline Phosphatase: 62 U/L (ref 38–126)
Anion gap: 8 (ref 5–15)
BUN: 7 mg/dL (ref 6–20)
CO2: 30 mmol/L (ref 22–32)
Calcium: 9.5 mg/dL (ref 8.9–10.3)
Chloride: 101 mmol/L (ref 98–111)
Creatinine, Ser: 0.55 mg/dL (ref 0.44–1.00)
GFR, Estimated: >60 mL/min (ref >60)
Glucose, Bld: 91 mg/dL (ref 70–99)
Potassium: 3.4 mmol/L — ABNORMAL LOW (ref 3.5–5.1)
Sodium: 139 mmol/L (ref 135–145)
Total Bilirubin: 0.3 mg/dL (ref 0.3–1.2)
Total Protein: 7.1 g/dL (ref 6.5–8.1)

## 2022-04-14 LAB — CBC WITH DIFFERENTIAL/PLATELET
Abs Immature Granulocytes: 0.02 10*3/uL (ref 0.00–0.07)
Basophils Absolute: 0 10*3/uL (ref 0.0–0.1)
Basophils Relative: 1 %
Eosinophils Absolute: 0 10*3/uL (ref 0.0–0.5)
Eosinophils Relative: 0 %
HCT: 40 % (ref 36.0–46.0)
Hemoglobin: 13.4 g/dL (ref 12.0–15.0)
Immature Granulocytes: 0 %
Lymphocytes Relative: 9 %
Lymphs Abs: 0.7 10*3/uL (ref 0.7–4.0)
MCH: 32.7 pg (ref 26.0–34.0)
MCHC: 33.5 g/dL (ref 30.0–36.0)
MCV: 97.6 fL (ref 80.0–100.0)
Monocytes Absolute: 0.6 10*3/uL (ref 0.1–1.0)
Monocytes Relative: 7 %
Neutro Abs: 6.9 10*3/uL (ref 1.7–7.7)
Neutrophils Relative %: 83 %
Platelets: 216 10*3/uL (ref 150–400)
RBC: 4.1 MIL/uL (ref 3.87–5.11)
RDW: 12.5 % (ref 11.5–15.5)
WBC: 8.3 10*3/uL (ref 4.0–10.5)
nRBC: 0 % (ref 0.0–0.2)

## 2022-04-14 LAB — LIPASE, BLOOD: Lipase: <10 U/L — ABNORMAL LOW (ref 11–51)

## 2022-04-14 LAB — C-REACTIVE PROTEIN: CRP: 6 mg/dL — ABNORMAL HIGH (ref <1.0)

## 2022-04-14 LAB — SEDIMENTATION RATE: Sed Rate: 5 mm/hr (ref 0–22)

## 2022-04-14 MED ORDER — DIVALPROEX SODIUM ER 250 MG PO TB24
500.0000 mg | ORAL_TABLET | Freq: Once | ORAL | Status: AC
  Administered 2022-04-14: 500 mg via ORAL
  Filled 2022-04-14: qty 2

## 2022-04-14 MED ORDER — ACETAMINOPHEN 500 MG PO TABS
1000.0000 mg | ORAL_TABLET | Freq: Once | ORAL | Status: AC
  Administered 2022-04-14: 1000 mg via ORAL
  Filled 2022-04-14: qty 2

## 2022-04-14 MED ORDER — ONDANSETRON 4 MG PO TBDP
4.0000 mg | ORAL_TABLET | Freq: Three times a day (TID) | ORAL | 0 refills | Status: AC | PRN
  Filled 2022-04-14: qty 15, 5d supply, fill #0

## 2022-04-14 NOTE — Discharge Instructions (Signed)
You may take over-the-counter medicine for symptomatic relief, such as Tylenol, Motrin, TheraFlu, Alka seltzer , black elderberry, etc. Please limit acetaminophen (Tylenol) to 4000 mg and Ibuprofen (Motrin, Advil, etc.) to 2400 mg for a 24hr period. Please note that other over-the-counter medicine may contain acetaminophen or ibuprofen as a component of their ingredients.   

## 2022-04-14 NOTE — ED Provider Notes (Signed)
[facility] EMERGENCY DEPT Provider Note  CSN: [id number] Arrival date & time: 04/13/22 1748  Chief Complaint(s) Cough  HPI Victoria Bates is a 41 y.o. female with a past medical history listed below who presents to the emergency department with 5 days of cough, congestion, myalgias and worsening headache.  Positive sick contacts at home.  Patient endorses nausea and vomiting.  Upper abdominal discomfort.  Patient endorsed having an epidural injection couple days ago.  The history is provided by the patient and the spouse.    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   Headache 02/12/2014   Headache(784.0)    migraines   Herniated disc    Hot flashes 10/03/2014   Infection    to right head incision   Lactation disorder 10/03/2014   Liver hemangioma    Liver lesion 08/22/2014   Nausea alone 02/12/2014   Neuropathic pain 04/08/2014   Petit mal seizure status ([place])    Seizures ([place])    petit mal serizures most days   Patient Active Problem List   Diagnosis Date Noted   Left foot pain 02/04/2020   Dysuria 08/03/2018   Elevated BP without diagnosis of hypertension 08/03/2018   Chronic leukopenia 08/03/2018   Acute pain 04/18/2018   Polypharmacy 11/14/2017   Easy bruising 11/14/2017   Sinus infection 11/14/2017   Pulmonary nodules 08/18/2017   Vitamin D deficiency 07/19/2017   Vitamin B12 deficiency 07/19/2017   Cough, persistent 06/20/2017   Orthostatic dizziness 01/24/2017   Addison disease ([place]) 01/16/2017   Anxiety 01/16/2017   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    Goals of care, counseling/discussion 11/20/2016   Pancytopenia, acquired ([place]) 10/10/2016   Deficiency anemia 10/10/2016   Upper airway cough syndrome 01/28/2016   Cough variant asthma 01/28/2016   Dyspnea 01/28/2016   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   Constipation, acute 10/12/2015   Cognitive impairment 08/13/2015   Dysphagia, idiopathic 04/30/2015   Low serum cortisol level 10/20/2014   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, not intractable, without status epilepticus ([place]) 10/17/2014   Astrocytoma ([place]) 10/17/2014   Chronic fatigue 10/03/2014   Hot flashes 10/03/2014   Liver lesion 08/22/2014   Pancytopenia due to antineoplastic chemotherapy ([place]) 06/23/2014   Genetic testing 06/13/2014   Intractable chronic cluster headache 04/28/2014   Thrombocytopenia ([place]) 04/28/2014   Neuropathic pain 04/08/2014   Simple partial seizures ([place]) 03/20/2014   Sensory seizure ([place]) 03/20/2014   Dysphasia 02/12/2014   Headache 02/12/2014   Seizures ([place]) 02/12/2014   Financial difficulty 02/12/2014   CIN I (cervical intraepithelial neoplasia I) 02/12/2014   Anemia, unspecified 02/12/2014   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  Authorizing Provider  ondansetron (ZOFRAN-ODT) 4 MG disintegrating tablet Dissolve 1 tablet (4 mg total) by mouth every 8 (eight) hours as needed for up to 3 days for nausea or vomiting. 04/14/22 04/19/22 Yes Renarda Mullinix, Grayce Sessions, MD  albuterol (VENTOLIN HFA) 108 (90 Base) MCG/ACT inhaler Inhale 2 puffs into the lungs every 4 (four) hours as needed for up to 30 days for Shortness of Breath. 12/01/20      albuterol (VENTOLIN HFA) 108 (90 Base) MCG/ACT inhaler Inhale 1 puff into the lungs every 4 hours as needed 04/12/22     aspirin 325 MG tablet Take 325 mg by mouth daily.    [provider]  butalbital-acetaminophen-caffeine (FIORICET) 50-325-40 MG tablet Take 1 tablet by mouth 3 times daily as needed for headache. 04/05/22   Heath Lark, MD  carisoprodol (SOMA) 350 MG tablet Take 1 tablet by mouth 3 times daily. 03/17/22   Heath Lark, MD  clindamycin (CLINDAGEL) 1 % gel Apply to affected area 2 times a day 03/17/22     cyclobenzaprine (FLEXERIL) 10 MG tablet Take 1 tablet by mouth 3 times a day as needed for pain and spasms 02/07/22     cyclobenzaprine (FLEXERIL) 10 MG tablet Take 1 tablet by mouth 3 times daily as needed for pain and spasms. 03/10/22     cyclobenzaprine (FLEXERIL) 10 MG tablet Take 1 tablet by mouth 3 times a day as needed for pain and spasms 04/11/22     divalproex (DEPAKOTE ER) 250 MG 24 hr tablet Take 2 tablets by mouth three times daily. 10/13/21   Cameron Sprang, MD  doxycycline (VIBRAMYCIN) 100 MG capsule Take 1 capsule (100 mg total) by mouth 2 (two) times daily for 10 days. 04/12/22     EPINEPHrine (EPIPEN 2-PAK) 0.3 mg/0.3 mL IJ SOAJ injection Inject as directed as needed for severe allergic reaction 03/14/22     EPINEPHRINE 0.3 mg/0.3 mL IJ SOAJ injection INJECT 0.3 MLS (ONE SYRINGE) INTO THE MUSCLE ONCE FOR 1 DOSE AS NEEDED. Patient taking differently: Inject 0.3 mg into the muscle once. 03/23/18   Kozlow, Donnamarie Poag, MD  gabapentin (NEURONTIN) 300 MG capsule Take 3 capsules in morning, 3 capsules at noon,  and 3 capsules at bedtime 10/13/21   Cameron Sprang, MD  Galcanezumab-gnlm [facility]) 120 MG/ML SOAJ Inject 1 pen into the skin every 30 (thirty) days. 10/13/21   Cameron Sprang, MD  HYDROcodone-acetaminophen (NORCO) 10-325 MG tablet TAKE 1 TABLET BY MOUTH 3 TIMES DAILY AS NEEDED 03/31/22 09/27/22  Heath Lark, MD  LORazepam (ATIVAN) 0.5 MG tablet Take 1 tablet by mouth 2  times daily as needed for anxiety. 03/17/22   Heath Lark, MD  metroNIDAZOLE (METROGEL) 1 % gel Apply to affected area daily 01/14/22     nabumetone (RELAFEN) 750 MG tablet Take 1 tablet by mouth twice a day as needed for pain 12/01/21     nabumetone (RELAFEN) 750 MG tablet Take 1 tablet by mouth twice a day with meals as needed for pain 03/08/22     nabumetone (RELAFEN) 750 MG tablet Take 1 tablet by mouth twice daily as needed for pain 03/10/22     propranolol (INDERAL) 20 MG tablet Take 1 tablet by mouth as needed, if no effect after 1 hour take 2nd tablet 04/12/22     Rimegepant Sulfate (NURTEC) 75 MG TBDP Dissolve 1 tablet in mouth at onset of migraine. Do not take more than 1 in a 24hour period 10/13/21   Cameron Sprang,  MD                                                                                                                                    Allergies Adhesive [tape], Soma [carisoprodol], Tramadol hcl, Latex, Medical adhesive remover, Percocet [oxycodone-acetaminophen], Prednisone, Shellfish allergy, Topamax [topiramate], Vancomycin, Zofran [ondansetron hcl], and Zomig [zolmitriptan]  Review of Systems Review of Systems As noted in HPI  Physical Exam Vital Signs  I have reviewed the triage vital signs BP 131/80   Pulse 82   Temp 97.8 F (36.6 C)   Resp 18   Ht '5\' 9"'$  (1.753 m)   Wt 74.4 kg   LMP 09/20/2012   SpO2 100%   BMI 24.22 kg/m   Physical Exam Vitals reviewed.  Constitutional:      General: She is not in acute distress.    Appearance: She is well-developed. She is ill-appearing. She is not diaphoretic.  HENT:     Head: Normocephalic and atraumatic.     Nose: Nose normal.     Mouth/Throat:     Lips: No lesions.     Tongue: No lesions.     Palate: No lesions.     Pharynx: No pharyngeal swelling or oropharyngeal exudate.     Tonsils: No tonsillar exudate or tonsillar abscesses.  Eyes:     General: No scleral icterus.       Right eye: No discharge.         Left eye: No discharge.     Conjunctiva/sclera: Conjunctivae normal.     Pupils: Pupils are equal, round, and reactive to light.  Neck:   Cardiovascular:     Rate and Rhythm: Normal rate and regular rhythm.     Heart sounds: No murmur heard.    No friction rub. No gallop.  Pulmonary:     Effort: Pulmonary effort is normal. No respiratory distress.     Breath sounds: Normal breath sounds. No stridor. No rales.  Abdominal:     General: There is no distension.     Palpations: Abdomen is soft.     Tenderness: There is abdominal tenderness in the right upper quadrant and epigastric area.  Musculoskeletal:        General: No tenderness.     Cervical back: Normal range of motion and neck supple. Muscular tenderness present. No spinous process tenderness.  Skin:    General: Skin is warm and dry.     Findings: No erythema or rash.  Neurological:     Mental Status: She is alert and oriented to person, place, and time.     ED Results and Treatments Labs (all labs ordered are listed, but only abnormal results are displayed) Labs Reviewed  COMPREHENSIVE METABOLIC PANEL - Abnormal; Notable for the following components:      Result Value   Potassium 3.4 (*)    AST 11 (*)    All other components within normal  limits  LIPASE, BLOOD - Abnormal; Notable for the following components:   Lipase <10 (*)    All other components within normal limits  RESP PANEL BY RT-PCR (FLU A&B, COVID) ARPGX2  GROUP A STREP BY PCR  CBC WITH DIFFERENTIAL/PLATELET  SEDIMENTATION RATE  C-REACTIVE PROTEIN                                                                                                                         EKG  EKG Interpretation  Date/Time:  Wednesday April 13 2022 18:15:04 EDT Ventricular Rate:  112 PR Interval:  124 QRS Duration: 90 QT Interval:  342 QTC Calculation: 466 R Axis:   66 Text Interpretation: Sinus tachycardia Cannot rule out Inferior infarct , age undetermined Abnormal  ECG When compared with ECG of 04-May-2017 18:23, No significant change was found No acute changes Confirmed by Addison Lank [phone]) on 04/13/2022 10:51:00 PM       Radiology No results found.  Medications Ordered in ED Medications  prochlorperazine (COMPAZINE) injection 10 mg (10 mg Intravenous Given 04/14/22 0023)  ketorolac (TORADOL) 15 MG/ML injection 15 mg (15 mg Intravenous Given 04/14/22 0024)  sodium chloride 0.9 % bolus 1,000 mL (0 mLs Intravenous Stopped 04/14/22 0243)  divalproex (DEPAKOTE ER) 24 hr tablet 500 mg (500 mg Oral Given 04/14/22 0120)  acetaminophen (TYLENOL) tablet 1,000 mg (1,000 mg Oral Given 04/14/22 0120)                                                                                                                                     Procedures Procedures  (including critical care time)  Medical Decision Making / ED Course   Medical Decision Making Amount and/or Complexity of Data Reviewed Labs: ordered. Decision-making details documented in ED Course. Radiology: ordered and independent interpretation performed. Decision-making details documented in ED Course. ECG/medicine tests: ordered and independent interpretation performed. Decision-making details documented in ED Course.  Risk OTC drugs. Prescription drug management.    Patient presents symptoms most consistent with viral process.  COVID/influenza negative.  Rapid strep negative.  Chest x-ray without evidence of pneumonia.  Given the abdominal pain and tenderness, labs were ordered and reassuring without leukocytosis or anemia.  No significant electrolyte derangements or renal sufficiency.  No evidence of bili obstruction or pancreatitis.  Right upper quadrant ultrasound negative for acute cholecystitis.   Patient was treated symptomatically with IV fluids,  antiemetics and migraine cocktail which provided significant relief.       Final Clinical Impression(s) / ED Diagnoses Final diagnoses:   Acute cough  Sinus congestion  Bad headache  Nausea and vomiting in adult  Abdominal discomfort   The patient appears reasonably screened and/or stabilized for discharge and I doubt any other medical condition or other [facility] requiring further screening, evaluation, or treatment in the ED at this time. I have discussed the findings, Dx and Tx plan with the patient/family who expressed understanding and agree(s) with the plan. Discharge instructions discussed at length. The patient/family was given strict return precautions who verbalized understanding of the instructions. No further questions at time of discharge.  Disposition: Discharge  Condition: Good  ED Discharge Orders          Ordered    ondansetron (ZOFRAN-ODT) 4 MG disintegrating tablet  Every 8 hours PRN        04/14/22 0247              Follow Up: Lavone Orn, MD [address] [place] [address] [phone]  Call  to schedule an appointment for close follow up           This chart was dictated using voice recognition software.  Despite best efforts to proofread,  errors can occur which can change the documentation meaning.    Fatima Blank, MD 04/15/22 [phone]

## 2022-04-15 ENCOUNTER — Inpatient Hospital Stay: Payer: Medicare Other | Admitting: Hematology and Oncology

## 2022-04-15 ENCOUNTER — Encounter: Payer: Self-pay | Admitting: Hematology and Oncology

## 2022-04-15 ENCOUNTER — Telehealth: Payer: Self-pay

## 2022-04-15 ENCOUNTER — Ambulatory Visit: Payer: Medicare Other | Admitting: Hematology and Oncology

## 2022-04-15 NOTE — Telephone Encounter (Signed)
-----   Message from Heath Lark, MD sent at 04/15/2022  9:04 AM EDT ----- Pls call her and thank her for letting us know in advance I will not get mad, it is best for her to rest Please reschedule appt for 10/13 at 2 pm, 45 mins if ok with her

## 2022-04-15 NOTE — Telephone Encounter (Signed)
Called and left below message for [place]. Ask for a call back.  Called and given below message to [employer]. Appt for today canceled. He verbalized understanding. He will talk with Estill Bamberg and call the office back if 10/13 at 2 pm is okay.

## 2022-04-21 ENCOUNTER — Other Ambulatory Visit (HOSPITAL_COMMUNITY): Payer: Self-pay

## 2022-04-25 DIAGNOSIS — M5416 Radiculopathy, lumbar region: Secondary | ICD-10-CM | POA: Diagnosis not present

## 2022-04-26 ENCOUNTER — Other Ambulatory Visit (HOSPITAL_COMMUNITY): Payer: Self-pay

## 2022-04-26 DIAGNOSIS — L309 Dermatitis, unspecified: Secondary | ICD-10-CM | POA: Diagnosis not present

## 2022-04-26 DIAGNOSIS — D485 Neoplasm of uncertain behavior of skin: Secondary | ICD-10-CM | POA: Diagnosis not present

## 2022-04-26 DIAGNOSIS — L989 Disorder of the skin and subcutaneous tissue, unspecified: Secondary | ICD-10-CM | POA: Diagnosis not present

## 2022-04-26 DIAGNOSIS — L718 Other rosacea: Secondary | ICD-10-CM | POA: Diagnosis not present

## 2022-04-26 MED ORDER — ALBUTEROL SULFATE HFA 108 (90 BASE) MCG/ACT IN AERS
INHALATION_SPRAY | RESPIRATORY_TRACT | 0 refills | Status: DC
  Filled 2022-04-26: qty 6.7, 25d supply, fill #0

## 2022-04-26 MED ORDER — CLINDAMYCIN PHOSPHATE 1 % EX GEL
CUTANEOUS | 0 refills | Status: DC
  Filled 2022-04-26: qty 60, 14d supply, fill #0

## 2022-04-26 MED ORDER — FLUTICASONE PROPIONATE 0.05 % EX CREA
TOPICAL_CREAM | CUTANEOUS | 0 refills | Status: DC
  Filled 2022-04-26: qty 60, 30d supply, fill #0

## 2022-04-26 MED ORDER — DOXYCYCLINE HYCLATE 100 MG PO CAPS
ORAL_CAPSULE | ORAL | 0 refills | Status: DC
  Filled 2022-04-26: qty 20, 10d supply, fill #0

## 2022-04-27 ENCOUNTER — Other Ambulatory Visit (HOSPITAL_COMMUNITY): Payer: Self-pay

## 2022-04-28 ENCOUNTER — Other Ambulatory Visit: Payer: Self-pay | Admitting: Hematology and Oncology

## 2022-04-28 ENCOUNTER — Other Ambulatory Visit (HOSPITAL_COMMUNITY): Payer: Self-pay

## 2022-04-28 DIAGNOSIS — C719 Malignant neoplasm of brain, unspecified: Secondary | ICD-10-CM

## 2022-04-28 DIAGNOSIS — L309 Dermatitis, unspecified: Secondary | ICD-10-CM | POA: Diagnosis not present

## 2022-04-28 MED ORDER — CYCLOBENZAPRINE HCL 10 MG PO TABS
10.0000 mg | ORAL_TABLET | Freq: Three times a day (TID) | ORAL | 1 refills | Status: DC | PRN
  Filled 2022-04-28 – 2022-04-29 (×2): qty 30, 10d supply, fill #0
  Filled 2022-05-09: qty 30, 10d supply, fill #1

## 2022-04-29 ENCOUNTER — Inpatient Hospital Stay: Payer: Medicare Other | Attending: Hematology and Oncology | Admitting: Hematology and Oncology

## 2022-04-29 ENCOUNTER — Other Ambulatory Visit (HOSPITAL_COMMUNITY): Payer: Self-pay

## 2022-04-29 ENCOUNTER — Other Ambulatory Visit: Payer: Self-pay

## 2022-04-29 ENCOUNTER — Encounter: Payer: Self-pay | Admitting: Hematology and Oncology

## 2022-04-29 VITALS — BP 162/88 | HR 102 | Temp 97.4°F | Resp 18 | Ht 69.0 in | Wt 169.8 lb

## 2022-04-29 DIAGNOSIS — J029 Acute pharyngitis, unspecified: Secondary | ICD-10-CM | POA: Insufficient documentation

## 2022-04-29 DIAGNOSIS — C711 Malignant neoplasm of frontal lobe: Secondary | ICD-10-CM | POA: Diagnosis not present

## 2022-04-29 DIAGNOSIS — R1011 Right upper quadrant pain: Secondary | ICD-10-CM | POA: Diagnosis not present

## 2022-04-29 DIAGNOSIS — R21 Rash and other nonspecific skin eruption: Secondary | ICD-10-CM | POA: Diagnosis not present

## 2022-04-29 DIAGNOSIS — G44021 Chronic cluster headache, intractable: Secondary | ICD-10-CM

## 2022-04-29 DIAGNOSIS — Z79899 Other long term (current) drug therapy: Secondary | ICD-10-CM | POA: Diagnosis not present

## 2022-04-29 DIAGNOSIS — R059 Cough, unspecified: Secondary | ICD-10-CM | POA: Diagnosis not present

## 2022-04-29 DIAGNOSIS — Z885 Allergy status to narcotic agent status: Secondary | ICD-10-CM | POA: Insufficient documentation

## 2022-04-29 DIAGNOSIS — G40909 Epilepsy, unspecified, not intractable, without status epilepticus: Secondary | ICD-10-CM | POA: Diagnosis not present

## 2022-04-29 DIAGNOSIS — Z881 Allergy status to other antibiotic agents status: Secondary | ICD-10-CM | POA: Insufficient documentation

## 2022-04-29 DIAGNOSIS — Z888 Allergy status to other drugs, medicaments and biological substances status: Secondary | ICD-10-CM | POA: Insufficient documentation

## 2022-04-29 MED ORDER — HYDROCODONE-ACETAMINOPHEN 10-325 MG PO TABS
1.0000 | ORAL_TABLET | Freq: Three times a day (TID) | ORAL | 0 refills | Status: DC | PRN
  Filled 2022-04-29: qty 90, 30d supply, fill #0
  Filled 2022-04-29: qty 26, 9d supply, fill #0

## 2022-04-29 MED ORDER — LORAZEPAM 0.5 MG PO TABS: 0.5000 mg | ORAL_TABLET | Freq: Every day | ORAL | 0 refills | Status: DC | PRN

## 2022-04-29 NOTE — Progress Notes (Signed)
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 I reviewed report of recent MRI done at [facility] There are slight changes but overall fairly stable She is on active surveillance with repeat MRI again in 6 months There is possibility she might qualify for treatment in the future that is less toxic than chemotherapy So far, her headaches are stable and she will continue seizure management per neurologist I plan to see her again in 6 months for further follow-up with plan for future medication taper  Intractable chronic cluster headache The patient has complex chronic headaches, with difficulties with medication taper in the past Due to better control of her headaches and seizures with increased dose of gabapentin recently, my plan for this year would be to continue all her medications as prescribed except we will continue to taper her off lorazepam by the end of the year I will continue to refill her prescription pain medicine for now without changes  Skin rash The cause of her skin rash is unknown Skin biopsy was performed and she has appointment to see dermatologist  No orders of the defined types were placed in this encounter.   All questions were answered. The patient knows to call the clinic with any problems, questions or concerns. The total time spent in the appointment was 30 minutes  encounter with patients including review of chart and various tests results, discussions about plan of care and coordination of care plan   Heath Lark, MD 04/29/2022 2:25 PM  INTERVAL HISTORY: Please see below for problem oriented charting. she returns for symptom management for chronic headaches in the setting of history of resected brain tumor I have reviewed recommendation and documentation from [facility] I have also reviewed recent blood work and imaging study performed here when she was unwell She is here accompanied by her husband Her chronic headaches are stable Her seizure disorder is stable She had recent skin rash on her face and over the nasal bridge, and had been placed on multiple different ointment and cream without success of controlling his skin rash Skin biopsy was performed several days ago No other new neurological deficits  REVIEW OF SYSTEMS:   Constitutional: Denies fevers, chills or abnormal weight loss Eyes: Denies blurriness of vision Ears, nose, mouth, throat, and face: Denies mucositis or sore throat Respiratory: Denies cough, dyspnea or wheezes Cardiovascular: Denies palpitation, chest discomfort or lower extremity swelling Gastrointestinal:  Denies nausea, heartburn or change in bowel habits Lymphatics: Denies new lymphadenopathy or easy bruising Neurological:Denies numbness, tingling or new weaknesses Behavioral/Psych: Mood is stable, no new changes  All other systems were reviewed with the patient and are negative.  I have reviewed the past medical history, past surgical history, social history and family history with the patient and they are unchanged from previous note.  ALLERGIES:  is allergic to adhesive [tape], soma [carisoprodol], tramadol hcl, latex, medical adhesive remover, percocet [oxycodone-acetaminophen], prednisone, shellfish allergy, topamax [topiramate], vancomycin, zofran [ondansetron hcl], and zomig [zolmitriptan].  MEDICATIONS:   Current  Outpatient Medications  Medication Sig Dispense Refill   albuterol (VENTOLIN HFA) 108 (90 Base) MCG/ACT inhaler Inhale 1 puff into the lungs every 4 hours as needed 6.7 g 0   aspirin 325 MG tablet Take 325 mg by mouth daily.     butalbital-acetaminophen-caffeine (FIORICET) 50-325-40 MG tablet Take 1 tablet by mouth 3 times daily as needed for headache. 90 tablet 0   carisoprodol (SOMA) 350 MG tablet Take 1 tablet by mouth 3 times daily. 270 tablet 0   clindamycin (CLINDAGEL) 1 % gel Apply to affected area 2 times a day 60 g 0   cyclobenzaprine (FLEXERIL) 10 MG tablet Take 1 tablet by mouth 3 times a day as needed for pain and spasms 30 tablet 1   cyclobenzaprine (FLEXERIL) 10 MG tablet Take 1 tablet (10 mg total) by mouth 3 (three) times daily as needed for pain and spasms. 30 tablet 1   divalproex (DEPAKOTE ER) 250 MG 24 hr tablet Take 2 tablets by mouth three times daily. 180 tablet 11   EPINEPHrine (EPIPEN 2-PAK) 0.3 mg/0.3 mL IJ SOAJ injection Inject as directed as needed for severe allergic reaction 2 each 1   fluticasone (CUTIVATE) 0.05 % cream Apply to affected area(s) twice daily as directed 60 g 0   gabapentin (NEURONTIN) 300 MG capsule Take 3 capsules in morning, 3 capsules at noon,  and 3 capsules at bedtime 270 capsule 11   Galcanezumab-gnlm (EMGALITY) 120 MG/ML SOAJ Inject 1 pen into the skin every 30 (thirty) days. 1.12 mL 11   HYDROcodone-acetaminophen (NORCO) 10-325 MG tablet Take 1 tablet by mouth 3 (three) times daily as needed. 90 tablet 0   LORazepam (ATIVAN) 0.5 MG tablet Take 1 tablet (0.5 mg total) by mouth daily as needed for anxiety. 60 tablet 0   metroNIDAZOLE (METROGEL) 1 % gel Apply 1 Application topically daily for 10 days. 60 g 0   nabumetone (RELAFEN) 750 MG tablet Take 1 tablet by mouth twice daily as needed for pain 20 tablet 1   propranolol (INDERAL) 20 MG tablet Take 1 tablet by mouth as needed, if no effect after 1 hour take 2nd tablet 180 tablet 0    Rimegepant Sulfate (NURTEC) 75 MG TBDP Dissolve 1 tablet in mouth at onset of migraine. Do not take more than 1 in a 24hour period 10 tablet 11   No current facility-administered medications for this visit.    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   09/27/2013 Pathology Results   Accession: [id number] pathology showed grade 2 oligodendroglioma with mutant IDH1 protein, ATRX, OLIG 2 expression. p53 is negative, low Ki-67 labeling index. Molecular SNP array studies demonstrate whole arm 1p19q codeletion."   10/28/2013 Imaging   Ct scan of head showed possible abscess.   11/06/2013 Imaging   Repeat CT scan of the head showed resolution of abscess.   01/23/2014 Surgery   Dr. Christella Noa perform a subtotal gross resection of the tumor.   01/24/2014 Imaging   Repeat imaging studies showed persistent and residual disease.   03/03/2014 - 09/01/2014 Chemotherapy   She was started on Temodar, 5 days every 28 days   03/13/2014 Imaging   Repeat MRI showed no evidence of progression of disease.   07/14/2014 Imaging   Repeat MRI showed no evidence of disease progression.   09/12/2014 Imaging   MRI of  the liver show mild regression of the size of lesions, presumed to be benign   09/15/2014 Imaging   Repeat MRI of the brain show persistent abnormalities, suspicious for residual disease   03/02/2015 Imaging    MRI brain at [place] show persistent abnormalities   08/10/2015 Imaging   Repeat MRI [facility] showed stable disease   08/24/2015 - 02/08/2016 Chemotherapy   She was started on Lomustine, Procarbazine and Vincristine chemotherapy   08/30/2015 Imaging   CT chest showed no evidence of aspiration pneumonia   11/18/2015 Imaging   MRI brain showed status post resection of right frontal lobe tumor without residual or recurrent tumor. 2.  Otherwise negative MRI of the brain.    03/02/2016 Imaging   Stable MRI post right frontal lobe tumor resection. Negative for recurrent tumor   06/01/2016 Imaging   Stable MRI post right frontal lobe tumor resection. No recurrent tumor identified   08/19/2016 Imaging   Unchanged examination without tumor recurrence.   11/17/2016 Imaging   Stable right frontal resection cavity. Stable focal mass effect within the right middle frontal gyrus at the superior margin of the cavity which may represent nonenhancing neoplasm. No evidence for tumor progression. Otherwise stable MRI of the brain   12/16/2016 Imaging   MR brain: 1. No acute intracranial abnormality. 2. Unchanged appearance of right frontal lobe resection cavity and surrounding hyperintense T2 weighted signal.   02/04/2017 Imaging   Repeat MRI Normal pituitary   Stable postop changes of right frontal tumor resection   05/12/2017 Imaging   The left ventricular size is normal. There is normal left ventricular wall thickness.  Left ventricular systolic function is normal. LV ejection fraction = 60-65%.  Left ventricular filling pattern is normal. The left ventricular wall motion is normal. The right ventricle is normal in size and function. There is no significant valvular stenosis or regurgitation No pulmonary hypertension. IVC size was normal. There is no pericardial effusion. There is no comparison study available   05/13/2017 Imaging   1.  No evidence of acute pulmonary thromboembolic disease. 2.  There is an 8 mm right lower lobe groundglass nodule. Statistically, in this age group, this is most likely to reflect benign etiologies such as a tiny focus of infection or nonspecific inflammation. 4 mm solid right upper lobe lung nodule. A CT scan could be obtained in 12 months to further evaluate if the patient has any known risk factors for malignancy. 3.  There is a 1.3 cm left adrenal nodule. This finding is incompletely  characterized although and is slightly increased in size relative to the abdominal MRI from February 2016. Although this finding is most likely benign, recommend follow-up adrenal protocol CT in one year.   05/13/2017 Imaging   MRI brain 1.  No acute intracranial abnormality. 2.  Postsurgical changes of a right frontal craniotomy for resection of a reported oligodendroglioma. Hyperintense T2/FLAIR signal around the resection cavity and in the subcortical white matter of a gyrus along the superior margin of the cavity is unchanged from 01/05/2015. No evidence of disease progression.   12/15/2017 Imaging   MRI brain at [facility] 1.  No acute intracranial abnormality. 2.  Postsurgical changes of right frontal craniotomy for resection of reported oligodendroglioma. No evidence of disease progression   12/28/2017 Procedure   Successful right IJ vein Port-A-Cath explant.   04/24/2018 Imaging   Stable post treatment appearance, RIGHT frontal oligodendroglioma.  No posttraumatic sequelae are evident.   01/30/2019 Imaging   Stable appearance  of treated right frontal oligodendroglioma. No change from 04/24/2018.   11/18/2019 Imaging   1. Stable postoperative changes of the right frontal lobe without evidence for residual or recurrent disease. 2. No acute intracranial abnormality or significant interval change.   03/18/2022 Imaging   1.  Stable exam from 07/28/2021. Compared to more remote prior exam from 2018, there is an area of T2 hyperintense signal in the white matter deep to the resection cavity that has mildly increased in extent. Recommend close attention to this region on follow-up; differential considerations for this finding include evolving posttreatment changes and slowly growing tumor.  2.  No acute intracranial abnormality.      PHYSICAL EXAMINATION: ECOG PERFORMANCE STATUS: 1 - Symptomatic but completely ambulatory  Vitals:   04/29/22 1412  BP: (!) 162/88  Pulse: (!) 102  Resp:  18  Temp: (!) 97.4 F (36.3 C)  SpO2: 100%   Filed Weights   04/29/22 1412  Weight: 169 lb 12.8 oz (77 kg)    GENERAL:alert, no distress and comfortable SKIN: Noted skin lesion on her right nasal bridge NEURO: alert & oriented x 3 with fluent speech, no focal motor/sensory deficits  LABORATORY DATA:  I have reviewed the data as listed    Component Value Date/Time   NA 139 04/13/2022 2307   NA 140 08/07/2019 0000   NA 140 06/15/2017 1207   K 3.4 (L) 04/13/2022 2307   K 3.9 06/15/2017 1207   CL 101 04/13/2022 2307   CO2 30 04/13/2022 2307   CO2 24 06/15/2017 1207   GLUCOSE 91 04/13/2022 2307   GLUCOSE 85 06/15/2017 1207   BUN 7 04/13/2022 2307   BUN 12 08/07/2019 0000   BUN 5.8 (L) 06/15/2017 1207   CREATININE 0.55 04/13/2022 2307   CREATININE 0.76 02/24/2020 1018   CREATININE 0.7 06/15/2017 1207   CALCIUM 9.5 04/13/2022 2307   CALCIUM 8.5 06/15/2017 1207   PROT 7.1 04/13/2022 2307   PROT 6.7 06/15/2017 1207   ALBUMIN 4.1 04/13/2022 2307   ALBUMIN 3.9 06/15/2017 1207   AST 11 (L) 04/13/2022 2307   AST 17 02/24/2020 1018   AST 14 06/15/2017 1207   ALT 11 04/13/2022 2307   ALT 15 02/24/2020 1018   ALT 11 06/15/2017 1207   ALKPHOS 62 04/13/2022 2307   ALKPHOS 42 06/15/2017 1207   BILITOT 0.3 04/13/2022 2307   BILITOT 0.5 02/24/2020 1018   BILITOT 0.36 06/15/2017 1207   GFRNONAA >60 04/13/2022 2307   GFRNONAA >60 02/24/2020 1018   GFRAA >60 02/24/2020 1018    No results found for: "SPEP", "UPEP"  Lab Results  Component Value Date   WBC 8.3 04/13/2022   NEUTROABS 6.9 04/13/2022   HGB 13.4 04/13/2022   HCT 40.0 04/13/2022   MCV 97.6 04/13/2022   PLT 216 04/13/2022      Chemistry      Component Value Date/Time   NA 139 04/13/2022 2307   NA 140 08/07/2019 0000   NA 140 06/15/2017 1207   K 3.4 (L) 04/13/2022 2307   K 3.9 06/15/2017 1207   CL 101 04/13/2022 2307   CO2 30 04/13/2022 2307   CO2 24 06/15/2017 1207   BUN 7 04/13/2022 2307   BUN 12  08/07/2019 0000   BUN 5.8 (L) 06/15/2017 1207   CREATININE 0.55 04/13/2022 2307   CREATININE 0.76 02/24/2020 1018   CREATININE 0.7 06/15/2017 1207      Component Value Date/Time   CALCIUM 9.5 04/13/2022 2307  CALCIUM 8.5 06/15/2017 1207   ALKPHOS 62 04/13/2022 2307   ALKPHOS 42 06/15/2017 1207   AST 11 (L) 04/13/2022 2307   AST 17 02/24/2020 1018   AST 14 06/15/2017 1207   ALT 11 04/13/2022 2307   ALT 15 02/24/2020 1018   ALT 11 06/15/2017 1207   BILITOT 0.3 04/13/2022 2307   BILITOT 0.5 02/24/2020 1018   BILITOT 0.36 06/15/2017 1207       RADIOGRAPHIC STUDIES: I have personally reviewed the radiological images as listed and agreed with the findings in the report. US Abdomen Limited RUQ (LIVER/GB)  Result Date: 04/13/2022 CLINICAL DATA:  Right upper quadrant pain EXAM: ULTRASOUND ABDOMEN LIMITED RIGHT UPPER QUADRANT COMPARISON:  12/15/2016 ultrasound of the abdomen, correlation is made with CT abdomen pelvis 02/24/2021 FINDINGS: Gallbladder: No gallstones or wall thickening visualized. No sonographic Murphy sign noted by sonographer. Common bile duct: Diameter: 5 mm, within normal limits. No intrahepatic biliary ductal dilatation. Liver: No focal lesion identified. Within normal limits in parenchymal echogenicity. Portal vein is patent on color Doppler imaging with normal direction of blood flow towards the liver. Other: None. IMPRESSION: 1. No acute finding in the right upper quadrant. Electronically Signed   By: Merilyn Baba M.D.   On: 04/13/2022 23:59   DG Chest 2 View  Result Date: 04/13/2022 CLINICAL DATA:  Cough and sore throat.  Body aches. EXAM: CHEST - 2 VIEW COMPARISON:  Chest radiograph dated August 23, 2019 FINDINGS: The heart size and mediastinal contours are within normal limits. Both lungs are clear. The visualized skeletal structures are unremarkable. IMPRESSION: No active cardiopulmonary disease. Electronically Signed   By: Keane Police D.O.   On: 04/13/2022 23:27

## 2022-04-29 NOTE — Assessment & Plan Note (Signed)
The patient has complex chronic headaches, with difficulties with medication taper in the past Due to better control of her headaches and seizures with increased dose of gabapentin recently, my plan for this year would be to continue all her medications as prescribed except we will continue to taper her off lorazepam by the end of the year I will continue to refill her prescription pain medicine for now without changes

## 2022-04-29 NOTE — Assessment & Plan Note (Signed)
I reviewed report of recent MRI done at [facility] There are slight changes but overall fairly stable She is on active surveillance with repeat MRI again in 6 months There is possibility she might qualify for treatment in the future that is less toxic than chemotherapy So far, her headaches are stable and she will continue seizure management per neurologist I plan to see her again in 6 months for further follow-up with plan for future medication taper

## 2022-04-29 NOTE — Assessment & Plan Note (Signed)
The cause of her skin rash is unknown Skin biopsy was performed and she has appointment to see dermatologist

## 2022-05-03 ENCOUNTER — Other Ambulatory Visit (HOSPITAL_COMMUNITY): Payer: Self-pay

## 2022-05-03 ENCOUNTER — Other Ambulatory Visit: Payer: Self-pay | Admitting: Hematology and Oncology

## 2022-05-03 MED ORDER — CLINDAMYCIN PHOSPHATE 1 % EX GEL
CUTANEOUS | 0 refills | Status: DC
  Filled 2022-05-03: qty 60, 30d supply, fill #0

## 2022-05-03 MED ORDER — BUTALBITAL-APAP-CAFFEINE 50-325-40 MG PO TABS
1.0000 | ORAL_TABLET | Freq: Three times a day (TID) | ORAL | 0 refills | Status: DC | PRN
  Filled 2022-05-03: qty 90, 30d supply, fill #0

## 2022-05-03 MED ORDER — PROPRANOLOL HCL 20 MG PO TABS
20.0000 mg | ORAL_TABLET | ORAL | 0 refills | Status: DC
  Filled 2022-05-03 – 2022-06-27 (×4): qty 180, 90d supply, fill #0

## 2022-05-04 ENCOUNTER — Other Ambulatory Visit (HOSPITAL_COMMUNITY): Payer: Self-pay

## 2022-05-05 ENCOUNTER — Other Ambulatory Visit (HOSPITAL_COMMUNITY): Payer: Self-pay

## 2022-05-09 ENCOUNTER — Other Ambulatory Visit (HOSPITAL_COMMUNITY): Payer: Self-pay

## 2022-05-10 ENCOUNTER — Other Ambulatory Visit (HOSPITAL_COMMUNITY): Payer: Self-pay

## 2022-05-16 ENCOUNTER — Ambulatory Visit (INDEPENDENT_AMBULATORY_CARE_PROVIDER_SITE_OTHER): Payer: Medicare Other | Admitting: Neurology

## 2022-05-16 ENCOUNTER — Other Ambulatory Visit (HOSPITAL_COMMUNITY): Payer: Self-pay

## 2022-05-16 ENCOUNTER — Encounter: Payer: Self-pay | Admitting: Neurology

## 2022-05-16 VITALS — BP 126/78 | HR 95 | Ht 68.0 in | Wt 168.0 lb

## 2022-05-16 DIAGNOSIS — G43719 Chronic migraine without aura, intractable, without status migrainosus: Secondary | ICD-10-CM

## 2022-05-16 DIAGNOSIS — G40109 Localization-related (focal) (partial) symptomatic epilepsy and epileptic syndromes with simple partial seizures, not intractable, without status epilepticus: Secondary | ICD-10-CM

## 2022-05-16 MED ORDER — DIVALPROEX SODIUM ER 250 MG PO TB24
500.0000 mg | ORAL_TABLET | Freq: Three times a day (TID) | ORAL | 11 refills | Status: DC
  Filled 2022-05-16: qty 180, 30d supply, fill #0
  Filled 2022-05-16: qty 180, fill #0
  Filled 2022-06-13: qty 180, 30d supply, fill #1
  Filled 2022-07-20: qty 180, 30d supply, fill #2
  Filled 2022-08-23 (×2): qty 180, 30d supply, fill #3
  Filled 2022-09-16: qty 180, 30d supply, fill #4
  Filled 2022-10-18: qty 180, 30d supply, fill #5
  Filled 2022-11-14: qty 180, 30d supply, fill #6
  Filled 2022-12-15 – 2022-12-16 (×2): qty 180, 30d supply, fill #7

## 2022-05-16 MED ORDER — GABAPENTIN 300 MG PO CAPS
ORAL_CAPSULE | ORAL | 11 refills | Status: DC
  Filled 2022-05-16: qty 270, fill #0
  Filled 2022-05-17 – 2022-05-19 (×2): qty 270, 30d supply, fill #0
  Filled 2022-06-22: qty 270, 30d supply, fill #1
  Filled 2022-07-20: qty 270, 30d supply, fill #2
  Filled 2022-08-23 (×2): qty 270, 30d supply, fill #3
  Filled 2022-09-16: qty 270, 30d supply, fill #4
  Filled 2022-10-18: qty 270, 30d supply, fill #5
  Filled 2022-11-14: qty 270, 30d supply, fill #6
  Filled 2022-12-15 – 2022-12-16 (×2): qty 270, 30d supply, fill #7

## 2022-05-16 MED ORDER — EMGALITY 120 MG/ML ~~LOC~~ SOAJ
1.0000 "pen " | SUBCUTANEOUS | 11 refills | Status: DC
  Filled 2022-05-16: qty 1, 30d supply, fill #0

## 2022-05-16 MED ORDER — ZOLMITRIPTAN 2.5 MG PO TABS
ORAL_TABLET | ORAL | 6 refills | Status: DC
  Filled 2022-05-16: qty 10, fill #0
  Filled 2022-05-16 – 2022-05-17 (×2): qty 10, 30d supply, fill #0
  Filled 2022-06-13: qty 10, 30d supply, fill #1
  Filled 2022-07-20: qty 10, 30d supply, fill #2
  Filled 2022-09-16: qty 10, 30d supply, fill #3
  Filled 2022-10-18: qty 10, 30d supply, fill #4
  Filled 2022-11-14: qty 10, 30d supply, fill #5
  Filled 2022-11-25 – 2022-12-16 (×4): qty 10, 30d supply, fill #6

## 2022-05-16 NOTE — Patient Instructions (Signed)
Good to see you.  After your dermatologist gives go signal, restart Emgality  2. Continue all your other medications  3. Follow-up in 6-8 months, call for any changes   Seizure Precautions: 1. If medication has been prescribed for you to prevent seizures, take it exactly as directed.  Do not stop taking the medicine without talking to your doctor first, even if you have not had a seizure in a long time.   2. Avoid activities in which a seizure would cause danger to yourself or to others.  Don't operate dangerous machinery, swim alone, or climb in high or dangerous places, such as on ladders, roofs, or girders.  Do not drive unless your doctor says you may.  3. If you have any warning that you may have a seizure, lay down in a safe place where you can't hurt yourself.    4.  No driving for 6 months from last seizure, as per [facility].   Please refer to the following link on the [place] website for more information: http://[email or web address]/[email or web address]/drivingu.cfm   5.  Maintain good sleep hygiene. Avoid alcohol.  6.  Notify your neurology if you are planning pregnancy or if you become pregnant.  7.  Contact your doctor if you have any problems that may be related to the medicine you are taking.  8.  Call 911 and bring the patient back to the ED if:        A.  The seizure lasts longer than 5 minutes.       B.  The patient doesn't awaken shortly after the seizure  C.  The patient has new problems such as difficulty seeing, speaking or moving  D.  The patient was injured during the seizure  E.  The patient has a temperature over 102 F (39C)  F.  The patient vomited and now is having trouble breathing

## 2022-05-16 NOTE — Progress Notes (Signed)
NEUROLOGY FOLLOW UP OFFICE NOTE  Victoria Bates [id number] 1980/12/19  HISTORY OF PRESENT ILLNESS: I had the pleasure of seeing Victoria Bates in follow-up in the neurology clinic on 05/16/2022.  The patient was last seen 7 months ago. Records and images were personally reviewed where available.  Her last brain MRI with and without contrast done at [facility] was stable from 07/2021. Compared to more remote prior exam from 2018, there is an area of T2 hyperintense signal in the white matter deep to the resection cavity that has mildly increased in extent. She is hoping to have a new drug approved by next spring. On her last visit, she reported seizures and continued headaches, Gabapentin was increased to 973m TID. She is also on Depakote 2571m2 tabs TID (50053mID) for seizure and headache prophylaxis. She was restarted on Emgality for migraine prophylaxis on last visit but states that after she had a facial rash, she was not sure if it was [employer] Nur[employer]. She forgot to take the [facility] then decided to stop it 2 months ago, however noticed a world of difference with the skin rash once stopping Nurtec. She thinks the headaches and seizures are better. She thinks the last bigger seizure was in August when she lost consciousness, her husband reported it to Dr. GorAlvy Bimlerhe has no recollection of it. She reports the "speech thing" has been an issue the last few months, she feels twitching around her mouth and eye, and takes an Ativan. She took one dose in the office. She thinks the headaches are better, there are a lot less of the small ones, but when she has a bigger headache, it takes a few days to resolve. She takes Fioricet and 1/2 Zomig 5mg65mound once a week. The more intense migraines with vomiting occur every 10 days or so. She feels the Gabapentin has also helped with her back pain. She denies any side effects on the Depakote or Gabapentin.    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 She was admitted to [facility] 09/19/2013 due to left vision changes and left facial heaviness. She reports that she had a 60-lb unintentional weight loss 4 months leading up to her admission, she was vomiting after every meal. She started having episodes where her hands would have low amplitude tremors and she feels the right lower face twitching. She would need to pause and take a minute to get words out. If she did not stop what she was doing, the symptoms could last for hours. She would feel like her body was "going 90 mph" with palpitations up to 200 bpm. She was started on Xanax for anxiety, but this did not help. She then started having vision changes on her left eye, she would park crookedly when she thought she was going straight. She saw an ophthalmologist, eye exam was normal but left optic neuritis was questioned and brain imaging recommended. She then started to have left facial heaviness and numbness on the left face, arm, and leg, and [facility] she was found to have a right frontal lesion. She underwent subtotal resection on 01/23/14 by Dr. CabbChristella Noa was started on Keppra for seizure prophylaxis at that time. She continued to have the recurrent episodes of tremors in both arms, palpitations, and right facial tightness, as well as episodes of left  face, neck, shoulder, and arm numbness, going down the left thumb and index finger. She describes her major seizures as "all of that times 100." After the episodes, she would be tired for a few days, wanting to sleep, with no energy. She has noticed that her short term memory is "gone." Keppra dose had been increased up to 2 grams daily, and this had caused a change in personality. She had seen neurologist Dr. Leonie Man, who switched her to Depakote. She tapered off Keppra and is currently on Depakote ER  555m BID with significant improvement in symptoms. She denies any side effects to the Depakote.    She has a history of recurrent headaches since her teenage years. Headaches are usually over the right temporal and occipital region, with sharp stabbing and throbbing pain, with a knot in her left neck region. There is occasional nausea, photo, and phonophobia. She was having these headaches around 1-2 times a week when younger, however since December 2014, headaches have been occurring around 2-3 times a week. She had been receiving injections in her neck for this. If she takes medication at the onset, they last a few hours, however they may last up to several days. She has found that taking Decadron for a couple of days has been the only helpful medication. She takes Fioricet rarely. Zomig, aspirin, and Tylenol were ineffective in the past. Her father and 19year old son have headaches. She denies any diplopia, dysarthria, dysphagia, bowel/bladder dysfunction. She has chronic back pain and had been taking Soma qid for several years, with worsening of pain when she tried reducing this. She was started on Gabapentin for the seizures and back pain, she is unsure if this helps. She had been taking Ultram for the pain, and once stopping this, she noticed an improvement in the seizures as well.     Diagnostic Data: [facility] regions which is likely from breach artifact from prior craniotomy. There is intermittent right frontal sharp activity but no definite epileptiform activity. Impression: Mild right hemispheric focal cortical irritability but no definitive epileptiform activity is noted.     Her 48-hour EEG did not show any epileptiform discharges. It showed breach artifact consistent with prior surgery. She reported numerous symptoms including headache, severe muscle spasm under the base of her skull on the right side, tremors in both hands,  jerking and pause in jaw (right side), left leg/inner thigh and left foot warm and pigmented, eyes twitching, fatigue, with no associated epileptiform correlate seen.  [facility] Depakote and Gabapentin were held. Typical events were not clearly captured, however she reported 2 spells where she reported dizziness and visual changes (tiles on floor looked uneven) with no EEG correlate.  Baseline EEG showed breach rhythm over the right hemisphere, no epileptiform discharges or electrographic seizures.   [facility] with baseline EEG showing breach rhythm over the right frontocentral region, no epileptiform discharges. There were numerous events and push buttons for headaches, dizziness, jerking jaw, shakes, tremors, jerk in speech, word loss, vision/depth perception changes, with no EEG correlate seen.   I personally reviewed MRI brain with and without contrast done 03/13/14 which showed post-op right-sided craniotomy. The surgical cavity in the right frontal lobe is filled with fluid, with note of improvement in layering blood products since prior study. The study of 01/24/2014 showed restricted  diffusion deep to the tumor. This is consistent with acute infarct which is resolving at this time. No acute infarct is present on today's study. No evidence of recurrent or residual tumor although the original tumor did not enhance. No mass effect or edema is present that would suggest recurrent tumor. Small amount of subarachnoid hemorrhage on the right again noted.  MRI 11/2015 which I personally reviewed, there is similar pattern of enhancement and abnormal FLAIR signal in the right frontal gyrus immediately superior to the resection, stable from previous scan.    Brain MRI with and without contrast done 11/18/2020 and 07/2021 showed similar post-treatment changes s/p right frontal lobe oligodendroglioma debulking, no  evidence of recurrent or progressive tumor.   Epilepsy Risk Factors:  Right frontal oligodendroglioma s/p subtotal resection. Otherwise she had a normal birth and early development.  There is no history of febrile convulsions, CNS infections such as meningitis/encephalitis, or family history of seizures.  Prior medications: Topamax ("I can't take that", made her very tired) Prior migraine preventative medications: Topiramate, Gabapentin, Depakote, Botox   PAST MEDICAL HISTORY: Past Medical History:  Diagnosis Date   Adrenal tumor    Anemia    Anxiety    Asthma    very mild- no inhaler use since July 2013   Brain tumor [facility])    Hx: of   Cancer ([place]) 2003   skin   Chronic fatigue 10/03/2014   Cognitive impairment 11/25/2583   Complication of anesthesia    takes longer for patient to go under anesthesia   Dizziness    Dysphasia 02/12/2014   Glioma, malignant ([place]) dx'd 09/2013   Headache 02/12/2014   Headache(784.0)    migraines   Herniated disc    Hot flashes 10/03/2014   Infection    to right head incision   Lactation disorder 10/03/2014   Liver hemangioma    Liver lesion 08/22/2014   Nausea alone 02/12/2014   Neuropathic pain 04/08/2014   Petit mal seizure status ([place])    Seizures ([place])    petit mal serizures most days    MEDICATIONS: Current Outpatient Medications on File Prior to Visit  Medication Sig Dispense Refill   albuterol (VENTOLIN HFA) 108 (90 Base) MCG/ACT inhaler Inhale 1 puff into the lungs every 4 hours as needed 6.7 g 0   aspirin 325 MG tablet Take 325 mg by mouth daily.     butalbital-acetaminophen-caffeine (FIORICET) 50-325-40 MG tablet Take 1 tablet by mouth 3 times daily as needed for headache. 90 tablet 0   carisoprodol (SOMA) 350 MG tablet Take 1 tablet by mouth 3 times daily. 270 tablet 0   clindamycin (CLINDAGEL) 1 % gel Apply to affected area 2 times a day 60 g 0   cyclobenzaprine (FLEXERIL) 10 MG tablet Take 1 tablet by mouth 3 times a day as needed  for pain and spasms 30 tablet 1   cyclobenzaprine (FLEXERIL) 10 MG tablet Take 1 tablet (10 mg total) by mouth 3 (three) times daily as needed for pain and spasms. 30 tablet 1   divalproex (DEPAKOTE ER) 250 MG 24 hr tablet Take 2 tablets by mouth three times daily. 180 tablet 11   EPINEPHrine (EPIPEN 2-PAK) 0.3 mg/0.3 mL IJ SOAJ injection Inject as directed as needed for severe allergic reaction 2 each 1   fluticasone (CUTIVATE) 0.05 % cream Apply to affected area(s) twice daily as directed 60 g 0   gabapentin (NEURONTIN) 300 MG capsule Take 3 capsules in morning, 3 capsules at noon,  and 3 capsules at bedtime 270 capsule 11   Galcanezumab-gnlm (EMGALITY) 120 MG/ML SOAJ Inject 1 pen into the skin every 30 (thirty) days. 1.12 mL 11   HYDROcodone-acetaminophen (NORCO) 10-325 MG tablet Take 1 tablet by mouth 3 (three) times daily as needed. 90 tablet 0   LORazepam (ATIVAN) 0.5 MG tablet Take 1 tablet (0.5 mg total) by mouth daily as needed for anxiety. 60 tablet 0   metroNIDAZOLE (METROGEL) 1 % gel Apply 1 Application topically daily for 10 days. 60 g 0   nabumetone (RELAFEN) 750 MG tablet Take 1 tablet by mouth twice daily as needed for pain 20 tablet 1   propranolol (INDERAL) 20 MG tablet Take 1 tablet (20 mg total) by mouth as needed, if no effect after 1 hour take 2nd tablet. 180 tablet 0   Rimegepant Sulfate (NURTEC) 75 MG TBDP Dissolve 1 tablet in mouth at onset of migraine. Do not take more than 1 in a 24hour period 10 tablet 11   No current facility-administered medications on file prior to visit.    ALLERGIES: Allergies  Allergen Reactions   Adhesive [Tape] Other (See Comments)    Skin tear   Soma [Carisoprodol]     Watson brand, specifically, causes disorientation (can take other brands)   Tramadol Hcl Other (See Comments)    Potentially worsen seizure control   Latex Hives   Medical Adhesive Remover     Per patient states reaction to dermabond when had hysterectomy. Stated was red  and irritated.    Percocet [Oxycodone-Acetaminophen] Other (See Comments)    makes pt angry   Prednisone Nausea And Vomiting   Shellfish Allergy Diarrhea and Nausea And Vomiting   Topamax [Topiramate]    Vancomycin Hives   Zofran [Ondansetron Hcl]     Pt. Stated side effects : abdominal pain / constipation    Zomig [Zolmitriptan] Hives    Blisters on face when she uses it     FAMILY HISTORY: Family History  Problem Relation Age of Onset   Hypertension Mother    Skin cancer Mother    Thyroid cancer Father    Skin cancer Father    Breast cancer Maternal Aunt        dx in her 64s   Prostate cancer Maternal Uncle 42   Melanoma Maternal Uncle    Ovarian cancer Maternal Grandmother        dx in her late 19s-40s   Breast cancer Maternal Aunt        bilateral breast cancer dx in 51s and 25s; possibly BRCA+   Other Son        multiple CAL spots, bright spots on brain MRI, ? chiari malformation    SOCIAL HISTORY: Social History   Socioeconomic History   Marital status: Married    Spouse name: Not on file   Number of children: 2   Years of education: College   Highest education level: Not on file  Occupational History   Occupation: Engineer, mining: [employer]  Tobacco Use   Smoking status: Never   Smokeless tobacco: Never  Vaping Use   Vaping Use: Never used  Substance and Sexual Activity   Alcohol use: No    Alcohol/week: 0.0 standard drinks of alcohol   Drug use: No   Sexual activity: Yes    Partners: Male    Birth control/protection: Surgical  Other Topics Concern   Not on file  Social History Narrative   Patient lives at  home with her two children.   Caffeine use: minimum of 4 cups daily   Right handed    Social Determinants of Health   Financial Resource Strain: Not on file  Food Insecurity: Not on file  Transportation Needs: Not on file  Physical Activity: Not on file  Stress: Not on file  Social Connections: Not on file  Intimate  Partner Violence: Not on file     PHYSICAL EXAM: Vitals:   05/16/22 1405  BP: 126/78  Pulse: 95  SpO2: 100%   General: No acute distress Head:  Normocephalic/atraumatic Skin/Extremities: No rash, no edema Neurological Exam: alert and awake. No aphasia or dysarthria. Fund of knowledge is appropriate.  Attention and concentration are normal.   Cranial nerves: Pupils equal, round. Extraocular movements intact with no nystagmus. Visual fields full.  No facial asymmetry.  Motor: Bulk and tone normal, muscle strength 5/5 throughout with no pronator drift.   Finger to nose testing intact.  Gait slow and cautious reporting back pain. She reported mouth and eye twitching and took an Ativan in the office, I did not clearly see any abnormal movements today.    IMPRESSION: This is a 41 yo RH woman with a history of grade 2 oligodengroglioma s/p subtotal resection, anxiety, chronic migraine, with recurrent episodes of low amplitude tremors, palpitations, and right facial tightening, as well as left face and arm numbness. She has had several prolonged EEGs which have not shown any seizure activity, her last 72-hour EEG at [facility] in 01/2020 showed breach rhythm over the right frontocentral region, no epileptiform discharges, numerous events and push buttons for headaches, dizziness, jerking jaw, shakes, tremors, jerk in speech, word loss, vision/depth perception changes, with no EEG correlate seen. Last brain MRI in 03/2022 stable from 07/2021. She reports some improvement in seizures and migraines with current regimen of Gabapentin 918m TID and Depakote 5044mTID (2502m tabs TID). She is interested in restarting the Emgality for migraine prophylaxis but will await clearance from her dermatologist first. Refills sent for prn Zomig. She is aware of [place] driving laws to stop driving after a seizure until 6 months seizure-free. Follow-up in 6-8 months, call for any changes.    Thank you for allowing me to  participate in her care.  Please do not hesitate to call for any questions or concerns.    KarEllouise Newer.D.   CC: Dr. GriLaurann Montanar. GorAlvy Bimler

## 2022-05-17 ENCOUNTER — Other Ambulatory Visit (HOSPITAL_COMMUNITY): Payer: Self-pay

## 2022-05-17 MED ORDER — CYCLOBENZAPRINE HCL 10 MG PO TABS
10.0000 mg | ORAL_TABLET | Freq: Three times a day (TID) | ORAL | 1 refills | Status: DC | PRN
  Filled 2022-05-17: qty 30, 10d supply, fill #0
  Filled 2022-05-24: qty 30, 10d supply, fill #1

## 2022-05-18 ENCOUNTER — Other Ambulatory Visit (HOSPITAL_COMMUNITY): Payer: Self-pay

## 2022-05-19 ENCOUNTER — Other Ambulatory Visit (HOSPITAL_COMMUNITY): Payer: Self-pay

## 2022-05-24 ENCOUNTER — Other Ambulatory Visit (HOSPITAL_COMMUNITY): Payer: Self-pay

## 2022-05-25 ENCOUNTER — Other Ambulatory Visit (HOSPITAL_COMMUNITY): Payer: Self-pay

## 2022-05-26 ENCOUNTER — Other Ambulatory Visit: Payer: Self-pay | Admitting: Hematology and Oncology

## 2022-05-26 ENCOUNTER — Other Ambulatory Visit (HOSPITAL_COMMUNITY): Payer: Self-pay

## 2022-05-26 DIAGNOSIS — C719 Malignant neoplasm of brain, unspecified: Secondary | ICD-10-CM

## 2022-05-26 MED ORDER — HYDROCODONE-ACETAMINOPHEN 10-325 MG PO TABS
1.0000 | ORAL_TABLET | Freq: Three times a day (TID) | ORAL | 0 refills | Status: DC | PRN
  Filled 2022-05-26 – 2022-05-27 (×2): qty 90, 30d supply, fill #0

## 2022-05-27 ENCOUNTER — Other Ambulatory Visit (HOSPITAL_COMMUNITY): Payer: Self-pay

## 2022-05-30 ENCOUNTER — Other Ambulatory Visit: Payer: Self-pay | Admitting: Hematology and Oncology

## 2022-05-31 ENCOUNTER — Other Ambulatory Visit (HOSPITAL_COMMUNITY): Payer: Self-pay

## 2022-05-31 MED ORDER — BUTALBITAL-APAP-CAFFEINE 50-325-40 MG PO TABS
1.0000 | ORAL_TABLET | Freq: Three times a day (TID) | ORAL | 0 refills | Status: DC | PRN
  Filled 2022-05-31: qty 90, 30d supply, fill #0

## 2022-06-02 DIAGNOSIS — L718 Other rosacea: Secondary | ICD-10-CM | POA: Diagnosis not present

## 2022-06-02 DIAGNOSIS — M5416 Radiculopathy, lumbar region: Secondary | ICD-10-CM | POA: Diagnosis not present

## 2022-06-02 DIAGNOSIS — M5412 Radiculopathy, cervical region: Secondary | ICD-10-CM | POA: Diagnosis not present

## 2022-06-03 ENCOUNTER — Other Ambulatory Visit (HOSPITAL_COMMUNITY): Payer: Self-pay

## 2022-06-06 ENCOUNTER — Other Ambulatory Visit (HOSPITAL_COMMUNITY): Payer: Self-pay

## 2022-06-06 MED ORDER — CYCLOBENZAPRINE HCL 10 MG PO TABS
10.0000 mg | ORAL_TABLET | Freq: Three times a day (TID) | ORAL | 1 refills | Status: DC | PRN
  Filled 2022-06-06: qty 30, 10d supply, fill #0
  Filled 2022-06-13 (×2): qty 30, 10d supply, fill #1

## 2022-06-13 ENCOUNTER — Other Ambulatory Visit (HOSPITAL_COMMUNITY): Payer: Self-pay

## 2022-06-14 ENCOUNTER — Other Ambulatory Visit (HOSPITAL_COMMUNITY): Payer: Self-pay

## 2022-06-22 ENCOUNTER — Other Ambulatory Visit (HOSPITAL_COMMUNITY): Payer: Self-pay

## 2022-06-22 ENCOUNTER — Other Ambulatory Visit: Payer: Self-pay | Admitting: Hematology and Oncology

## 2022-06-22 DIAGNOSIS — C719 Malignant neoplasm of brain, unspecified: Secondary | ICD-10-CM

## 2022-06-22 MED ORDER — HYDROCODONE-ACETAMINOPHEN 10-325 MG PO TABS
1.0000 | ORAL_TABLET | Freq: Three times a day (TID) | ORAL | 0 refills | Status: DC | PRN
  Filled 2022-06-22 – 2022-06-24 (×2): qty 90, 30d supply, fill #0

## 2022-06-22 MED ORDER — CYCLOBENZAPRINE HCL 10 MG PO TABS
10.0000 mg | ORAL_TABLET | Freq: Three times a day (TID) | ORAL | 1 refills | Status: DC | PRN
  Filled 2022-06-22: qty 30, 10d supply, fill #0
  Filled 2022-07-04: qty 30, 10d supply, fill #1

## 2022-06-24 ENCOUNTER — Other Ambulatory Visit (HOSPITAL_COMMUNITY): Payer: Self-pay

## 2022-06-27 ENCOUNTER — Other Ambulatory Visit: Payer: Self-pay | Admitting: Hematology and Oncology

## 2022-06-27 ENCOUNTER — Other Ambulatory Visit (HOSPITAL_COMMUNITY): Payer: Self-pay

## 2022-06-27 DIAGNOSIS — R110 Nausea: Secondary | ICD-10-CM

## 2022-06-27 DIAGNOSIS — R4702 Dysphasia: Secondary | ICD-10-CM

## 2022-06-27 DIAGNOSIS — R0789 Other chest pain: Secondary | ICD-10-CM

## 2022-06-27 DIAGNOSIS — R202 Paresthesia of skin: Secondary | ICD-10-CM

## 2022-06-27 DIAGNOSIS — R519 Headache, unspecified: Secondary | ICD-10-CM

## 2022-06-27 DIAGNOSIS — C711 Malignant neoplasm of frontal lobe: Secondary | ICD-10-CM

## 2022-06-27 MED ORDER — NABUMETONE 750 MG PO TABS
750.0000 mg | ORAL_TABLET | Freq: Two times a day (BID) | ORAL | 1 refills | Status: DC
  Filled 2022-06-27: qty 20, 10d supply, fill #0
  Filled 2022-08-31: qty 20, 10d supply, fill #1

## 2022-06-27 MED ORDER — CARISOPRODOL 350 MG PO TABS
350.0000 mg | ORAL_TABLET | Freq: Three times a day (TID) | ORAL | 0 refills | Status: DC
  Filled 2022-06-27: qty 239, 80d supply, fill #0
  Filled 2022-06-27: qty 31, 10d supply, fill #0

## 2022-06-27 MED ORDER — BUTALBITAL-APAP-CAFFEINE 50-325-40 MG PO TABS
1.0000 | ORAL_TABLET | Freq: Three times a day (TID) | ORAL | 0 refills | Status: DC | PRN
  Filled 2022-06-27: qty 90, 30d supply, fill #0

## 2022-06-27 MED ORDER — EPINEPHRINE 0.3 MG/0.3ML IJ SOAJ
INTRAMUSCULAR | 1 refills | Status: DC
  Filled 2022-06-27: qty 2, 30d supply, fill #0
  Filled 2022-09-07: qty 2, 30d supply, fill #1

## 2022-06-28 ENCOUNTER — Other Ambulatory Visit (HOSPITAL_COMMUNITY): Payer: Self-pay

## 2022-06-28 ENCOUNTER — Other Ambulatory Visit: Payer: Self-pay | Admitting: Hematology and Oncology

## 2022-06-28 MED ORDER — LORAZEPAM 0.5 MG PO TABS: 0.5000 mg | ORAL_TABLET | Freq: Every day | ORAL | 0 refills | Status: DC | PRN

## 2022-06-28 MED ORDER — LORAZEPAM 0.5 MG PO TABS
0.5000 mg | ORAL_TABLET | Freq: Every day | ORAL | 0 refills | Status: DC | PRN
  Filled 2022-06-28: qty 30, 30d supply, fill #0

## 2022-06-28 NOTE — Telephone Encounter (Signed)
On Rx it has no print?

## 2022-07-04 ENCOUNTER — Other Ambulatory Visit (HOSPITAL_COMMUNITY): Payer: Self-pay

## 2022-07-20 ENCOUNTER — Other Ambulatory Visit: Payer: Self-pay | Admitting: Hematology and Oncology

## 2022-07-20 ENCOUNTER — Other Ambulatory Visit (HOSPITAL_COMMUNITY): Payer: Self-pay

## 2022-07-20 DIAGNOSIS — C719 Malignant neoplasm of brain, unspecified: Secondary | ICD-10-CM

## 2022-07-20 MED ORDER — ALBUTEROL SULFATE HFA 108 (90 BASE) MCG/ACT IN AERS
INHALATION_SPRAY | RESPIRATORY_TRACT | 0 refills | Status: DC
  Filled 2022-07-20: qty 6.7, 30d supply, fill #0

## 2022-07-20 MED ORDER — HYDROCODONE-ACETAMINOPHEN 10-325 MG PO TABS
1.0000 | ORAL_TABLET | Freq: Three times a day (TID) | ORAL | 0 refills | Status: DC | PRN
  Filled 2022-07-20 – 2022-07-21 (×3): qty 90, 30d supply, fill #0

## 2022-07-20 MED ORDER — CLINDAMYCIN PHOSPHATE 1 % EX GEL
CUTANEOUS | 0 refills | Status: DC
  Filled 2022-07-20: qty 60, 30d supply, fill #0

## 2022-07-20 MED ORDER — CYCLOBENZAPRINE HCL 10 MG PO TABS
10.0000 mg | ORAL_TABLET | Freq: Three times a day (TID) | ORAL | 1 refills | Status: DC | PRN
  Filled 2022-07-20: qty 30, 10d supply, fill #0
  Filled 2022-07-29: qty 30, 10d supply, fill #1

## 2022-07-21 ENCOUNTER — Other Ambulatory Visit: Payer: Self-pay

## 2022-07-21 ENCOUNTER — Other Ambulatory Visit (HOSPITAL_COMMUNITY): Payer: Self-pay

## 2022-07-22 ENCOUNTER — Other Ambulatory Visit (HOSPITAL_COMMUNITY): Payer: Self-pay

## 2022-07-22 ENCOUNTER — Other Ambulatory Visit: Payer: Self-pay

## 2022-07-27 ENCOUNTER — Other Ambulatory Visit: Payer: Self-pay | Admitting: Hematology and Oncology

## 2022-07-27 ENCOUNTER — Other Ambulatory Visit (HOSPITAL_COMMUNITY): Payer: Self-pay

## 2022-07-27 ENCOUNTER — Other Ambulatory Visit (HOSPITAL_BASED_OUTPATIENT_CLINIC_OR_DEPARTMENT_OTHER): Payer: Self-pay

## 2022-07-27 MED ORDER — PROPRANOLOL HCL 20 MG PO TABS
ORAL_TABLET | ORAL | 0 refills | Status: DC
  Filled 2022-07-27 – 2022-09-07 (×6): qty 180, 90d supply, fill #0

## 2022-07-27 MED ORDER — BUTALBITAL-APAP-CAFFEINE 50-325-40 MG PO TABS
1.0000 | ORAL_TABLET | Freq: Three times a day (TID) | ORAL | 0 refills | Status: DC | PRN
  Filled 2022-07-27: qty 90, 30d supply, fill #0

## 2022-07-29 ENCOUNTER — Other Ambulatory Visit (HOSPITAL_COMMUNITY): Payer: Self-pay

## 2022-08-05 ENCOUNTER — Other Ambulatory Visit (HOSPITAL_COMMUNITY): Payer: Self-pay

## 2022-08-08 ENCOUNTER — Other Ambulatory Visit (HOSPITAL_COMMUNITY): Payer: Self-pay

## 2022-08-08 MED ORDER — CYCLOBENZAPRINE HCL 10 MG PO TABS
ORAL_TABLET | ORAL | 1 refills | Status: DC
  Filled 2022-08-08: qty 30, 10d supply, fill #0
  Filled 2022-08-16 (×2): qty 30, 10d supply, fill #1

## 2022-08-16 ENCOUNTER — Other Ambulatory Visit (HOSPITAL_COMMUNITY): Payer: Self-pay

## 2022-08-18 ENCOUNTER — Other Ambulatory Visit: Payer: Self-pay | Admitting: Hematology and Oncology

## 2022-08-18 ENCOUNTER — Other Ambulatory Visit (HOSPITAL_COMMUNITY): Payer: Self-pay

## 2022-08-18 DIAGNOSIS — C719 Malignant neoplasm of brain, unspecified: Secondary | ICD-10-CM

## 2022-08-19 ENCOUNTER — Other Ambulatory Visit (HOSPITAL_COMMUNITY): Payer: Self-pay

## 2022-08-19 ENCOUNTER — Telehealth: Payer: Self-pay

## 2022-08-19 ENCOUNTER — Other Ambulatory Visit: Payer: Self-pay

## 2022-08-19 ENCOUNTER — Encounter: Payer: Self-pay | Admitting: Hematology and Oncology

## 2022-08-19 ENCOUNTER — Other Ambulatory Visit: Payer: Self-pay | Admitting: Hematology

## 2022-08-19 MED ORDER — HYDROCODONE-ACETAMINOPHEN 10-325 MG PO TABS
1.0000 | ORAL_TABLET | Freq: Three times a day (TID) | ORAL | 0 refills | Status: DC | PRN
  Filled 2022-08-19 – 2022-08-31 (×2): qty 90, 30d supply, fill #0

## 2022-08-19 MED ORDER — HYDROCODONE-ACETAMINOPHEN 5-325 MG PO TABS
1.0000 | ORAL_TABLET | Freq: Three times a day (TID) | ORAL | 0 refills | Status: DC | PRN
  Filled 2022-08-19 (×2): qty 90, 15d supply, fill #0

## 2022-08-19 NOTE — Telephone Encounter (Signed)
Returned Pt's call and Mychart message regarding Norco rx refill. According to Pt's significant other, [name], Bowers reported that [place] 10-325 on back order when Pt tried to pick up. Pt reports she is "completely out of rx". After calling [facility], discovered this strength is on Producer, television/film/video. Spoke with Dr. Burr Medico, r/t Dr. Alvy Bimler being out of office for the day, who called in [place] 5-325 to [place]. Pt verbalized understanding of new strength with no further questions at this time.

## 2022-08-23 ENCOUNTER — Other Ambulatory Visit (HOSPITAL_COMMUNITY): Payer: Self-pay

## 2022-08-23 ENCOUNTER — Other Ambulatory Visit: Payer: Self-pay | Admitting: Hematology and Oncology

## 2022-08-23 MED ORDER — CYCLOBENZAPRINE HCL 10 MG PO TABS
ORAL_TABLET | ORAL | 1 refills | Status: DC
  Filled 2022-08-23: qty 30, 10d supply, fill #0
  Filled 2022-08-31: qty 30, 10d supply, fill #1

## 2022-08-23 MED ORDER — BUTALBITAL-APAP-CAFFEINE 50-325-40 MG PO TABS
1.0000 | ORAL_TABLET | Freq: Three times a day (TID) | ORAL | 0 refills | Status: DC | PRN
  Filled 2022-08-23: qty 90, 30d supply, fill #0

## 2022-08-28 DIAGNOSIS — R051 Acute cough: Secondary | ICD-10-CM | POA: Diagnosis not present

## 2022-08-28 DIAGNOSIS — J0100 Acute maxillary sinusitis, unspecified: Secondary | ICD-10-CM | POA: Diagnosis not present

## 2022-08-28 DIAGNOSIS — R6883 Chills (without fever): Secondary | ICD-10-CM | POA: Diagnosis not present

## 2022-08-28 DIAGNOSIS — J029 Acute pharyngitis, unspecified: Secondary | ICD-10-CM | POA: Diagnosis not present

## 2022-08-29 DIAGNOSIS — M5416 Radiculopathy, lumbar region: Secondary | ICD-10-CM | POA: Diagnosis not present

## 2022-08-29 DIAGNOSIS — M5412 Radiculopathy, cervical region: Secondary | ICD-10-CM | POA: Diagnosis not present

## 2022-08-31 ENCOUNTER — Other Ambulatory Visit: Payer: Self-pay

## 2022-08-31 ENCOUNTER — Other Ambulatory Visit: Payer: Self-pay | Admitting: Hematology and Oncology

## 2022-08-31 ENCOUNTER — Other Ambulatory Visit (HOSPITAL_COMMUNITY): Payer: Self-pay

## 2022-08-31 ENCOUNTER — Encounter: Payer: Self-pay | Admitting: Hematology and Oncology

## 2022-08-31 DIAGNOSIS — R202 Paresthesia of skin: Secondary | ICD-10-CM

## 2022-08-31 DIAGNOSIS — R519 Headache, unspecified: Secondary | ICD-10-CM

## 2022-08-31 DIAGNOSIS — R0789 Other chest pain: Secondary | ICD-10-CM

## 2022-08-31 DIAGNOSIS — R4702 Dysphasia: Secondary | ICD-10-CM

## 2022-08-31 DIAGNOSIS — R110 Nausea: Secondary | ICD-10-CM

## 2022-08-31 DIAGNOSIS — C711 Malignant neoplasm of frontal lobe: Secondary | ICD-10-CM

## 2022-08-31 MED ORDER — LORAZEPAM 0.5 MG PO TABS
0.5000 mg | ORAL_TABLET | Freq: Every day | ORAL | 0 refills | Status: DC | PRN
  Filled 2022-08-31: qty 30, 30d supply, fill #0

## 2022-08-31 MED ORDER — CARISOPRODOL 350 MG PO TABS
350.0000 mg | ORAL_TABLET | Freq: Three times a day (TID) | ORAL | 0 refills | Status: DC
  Filled 2022-08-31 – 2022-09-27 (×2): qty 270, 90d supply, fill #0

## 2022-09-01 ENCOUNTER — Other Ambulatory Visit (HOSPITAL_COMMUNITY): Payer: Self-pay

## 2022-09-02 ENCOUNTER — Other Ambulatory Visit (HOSPITAL_COMMUNITY): Payer: Self-pay

## 2022-09-02 ENCOUNTER — Other Ambulatory Visit (HOSPITAL_BASED_OUTPATIENT_CLINIC_OR_DEPARTMENT_OTHER): Payer: Self-pay

## 2022-09-02 DIAGNOSIS — M5416 Radiculopathy, lumbar region: Secondary | ICD-10-CM | POA: Diagnosis not present

## 2022-09-06 ENCOUNTER — Other Ambulatory Visit (HOSPITAL_COMMUNITY): Payer: Self-pay

## 2022-09-07 ENCOUNTER — Other Ambulatory Visit (HOSPITAL_COMMUNITY): Payer: Self-pay

## 2022-09-07 ENCOUNTER — Other Ambulatory Visit: Payer: Self-pay

## 2022-09-07 MED ORDER — CYCLOBENZAPRINE HCL 10 MG PO TABS
10.0000 mg | ORAL_TABLET | Freq: Three times a day (TID) | ORAL | 1 refills | Status: DC | PRN
  Filled 2022-09-07: qty 30, 10d supply, fill #0
  Filled 2022-09-14: qty 30, 10d supply, fill #1

## 2022-09-09 DIAGNOSIS — M6281 Muscle weakness (generalized): Secondary | ICD-10-CM | POA: Diagnosis not present

## 2022-09-09 DIAGNOSIS — M5416 Radiculopathy, lumbar region: Secondary | ICD-10-CM | POA: Diagnosis not present

## 2022-09-16 ENCOUNTER — Other Ambulatory Visit: Payer: Self-pay | Admitting: Hematology and Oncology

## 2022-09-16 ENCOUNTER — Other Ambulatory Visit (HOSPITAL_COMMUNITY): Payer: Self-pay

## 2022-09-16 MED ORDER — BUTALBITAL-APAP-CAFFEINE 50-325-40 MG PO TABS
1.0000 | ORAL_TABLET | Freq: Three times a day (TID) | ORAL | 0 refills | Status: DC | PRN
  Filled 2022-09-16 – 2022-09-21 (×2): qty 90, 30d supply, fill #0

## 2022-09-17 ENCOUNTER — Other Ambulatory Visit (HOSPITAL_COMMUNITY): Payer: Self-pay

## 2022-09-19 ENCOUNTER — Other Ambulatory Visit: Payer: Self-pay

## 2022-09-21 ENCOUNTER — Other Ambulatory Visit (HOSPITAL_COMMUNITY): Payer: Self-pay

## 2022-09-22 ENCOUNTER — Other Ambulatory Visit (HOSPITAL_COMMUNITY): Payer: Self-pay

## 2022-09-22 DIAGNOSIS — M6281 Muscle weakness (generalized): Secondary | ICD-10-CM | POA: Diagnosis not present

## 2022-09-22 DIAGNOSIS — M5416 Radiculopathy, lumbar region: Secondary | ICD-10-CM | POA: Diagnosis not present

## 2022-09-22 MED ORDER — CYCLOBENZAPRINE HCL 10 MG PO TABS
10.0000 mg | ORAL_TABLET | Freq: Three times a day (TID) | ORAL | 1 refills | Status: DC | PRN
  Filled 2022-09-22: qty 30, 10d supply, fill #0
  Filled 2022-09-29: qty 30, 10d supply, fill #1

## 2022-09-27 ENCOUNTER — Other Ambulatory Visit: Payer: Self-pay | Admitting: Hematology and Oncology

## 2022-09-27 ENCOUNTER — Other Ambulatory Visit (HOSPITAL_COMMUNITY): Payer: Self-pay

## 2022-09-27 ENCOUNTER — Other Ambulatory Visit: Payer: Self-pay

## 2022-09-27 DIAGNOSIS — C719 Malignant neoplasm of brain, unspecified: Secondary | ICD-10-CM

## 2022-09-27 MED ORDER — HYDROCODONE-ACETAMINOPHEN 10-325 MG PO TABS
1.0000 | ORAL_TABLET | Freq: Three times a day (TID) | ORAL | 0 refills | Status: DC | PRN
  Filled 2022-09-27 – 2022-09-29 (×2): qty 90, 30d supply, fill #0

## 2022-09-27 MED ORDER — ALBUTEROL SULFATE HFA 108 (90 BASE) MCG/ACT IN AERS
1.0000 | INHALATION_SPRAY | RESPIRATORY_TRACT | 0 refills | Status: DC | PRN
  Filled 2022-09-27: qty 6.7, 25d supply, fill #0

## 2022-09-29 ENCOUNTER — Other Ambulatory Visit (HOSPITAL_COMMUNITY): Payer: Self-pay

## 2022-09-29 DIAGNOSIS — M5416 Radiculopathy, lumbar region: Secondary | ICD-10-CM | POA: Diagnosis not present

## 2022-09-29 DIAGNOSIS — M6281 Muscle weakness (generalized): Secondary | ICD-10-CM | POA: Diagnosis not present

## 2022-09-30 ENCOUNTER — Other Ambulatory Visit: Payer: Self-pay

## 2022-09-30 ENCOUNTER — Other Ambulatory Visit (HOSPITAL_COMMUNITY): Payer: Self-pay

## 2022-10-05 DIAGNOSIS — M5412 Radiculopathy, cervical region: Secondary | ICD-10-CM | POA: Diagnosis not present

## 2022-10-18 ENCOUNTER — Other Ambulatory Visit: Payer: Self-pay | Admitting: Hematology and Oncology

## 2022-10-18 ENCOUNTER — Other Ambulatory Visit (HOSPITAL_COMMUNITY): Payer: Self-pay

## 2022-10-18 ENCOUNTER — Other Ambulatory Visit: Payer: Self-pay

## 2022-10-18 MED ORDER — BUTALBITAL-APAP-CAFFEINE 50-325-40 MG PO TABS
1.0000 | ORAL_TABLET | Freq: Three times a day (TID) | ORAL | 0 refills | Status: DC | PRN
  Filled 2022-10-18 – 2022-10-20 (×2): qty 90, 30d supply, fill #0

## 2022-10-18 MED ORDER — CYCLOBENZAPRINE HCL 10 MG PO TABS
10.0000 mg | ORAL_TABLET | Freq: Three times a day (TID) | ORAL | 1 refills | Status: DC | PRN
  Filled 2022-10-18: qty 30, 10d supply, fill #0
  Filled 2022-10-27: qty 30, 10d supply, fill #1

## 2022-10-18 MED ORDER — EPINEPHRINE 0.3 MG/0.3ML IJ SOAJ
INTRAMUSCULAR | 1 refills | Status: DC
  Filled 2022-10-18: qty 2, 30d supply, fill #0
  Filled 2022-11-14: qty 2, 30d supply, fill #1

## 2022-10-18 MED ORDER — NABUMETONE 750 MG PO TABS
750.0000 mg | ORAL_TABLET | Freq: Two times a day (BID) | ORAL | 1 refills | Status: DC
  Filled 2022-10-18: qty 20, 10d supply, fill #0

## 2022-10-20 ENCOUNTER — Other Ambulatory Visit (HOSPITAL_COMMUNITY): Payer: Self-pay

## 2022-10-21 DIAGNOSIS — M5412 Radiculopathy, cervical region: Secondary | ICD-10-CM | POA: Diagnosis not present

## 2022-10-27 ENCOUNTER — Other Ambulatory Visit (HOSPITAL_COMMUNITY): Payer: Self-pay

## 2022-10-27 ENCOUNTER — Other Ambulatory Visit: Payer: Self-pay

## 2022-10-27 ENCOUNTER — Other Ambulatory Visit: Payer: Self-pay | Admitting: Hematology and Oncology

## 2022-10-27 ENCOUNTER — Telehealth: Payer: Self-pay

## 2022-10-27 DIAGNOSIS — C719 Malignant neoplasm of brain, unspecified: Secondary | ICD-10-CM

## 2022-10-27 MED ORDER — LORAZEPAM 0.5 MG PO TABS
0.5000 mg | ORAL_TABLET | Freq: Every day | ORAL | 0 refills | Status: DC | PRN
  Filled 2022-10-27: qty 30, 30d supply, fill #0

## 2022-10-27 MED ORDER — HYDROCODONE-ACETAMINOPHEN 10-325 MG PO TABS
1.0000 | ORAL_TABLET | Freq: Three times a day (TID) | ORAL | 0 refills | Status: DC | PRN
Start: 2022-10-27 — End: 2022-11-22
  Filled 2022-10-27 – 2022-10-28 (×2): qty 90, 30d supply, fill #0

## 2022-10-27 MED ORDER — ALBUTEROL SULFATE HFA 108 (90 BASE) MCG/ACT IN AERS
INHALATION_SPRAY | RESPIRATORY_TRACT | 1 refills | Status: DC
  Filled 2022-10-27: qty 6.7, 25d supply, fill #0
  Filled 2022-11-18: qty 6.7, 25d supply, fill #1

## 2022-10-27 NOTE — Telephone Encounter (Signed)
Called her back. Dr. Burna Forts appt and MRI appt moved.  Rescheduled appt with Dr. Bertis Ruddy to 4/29. She is aware of appts.

## 2022-10-27 NOTE — Telephone Encounter (Signed)
Called her back and left a message. Per Dr. Bertis Ruddy, lab appt canceled on 4/29 and not needed. She can get  labs completed at [facility] if needed.

## 2022-10-28 ENCOUNTER — Other Ambulatory Visit (HOSPITAL_COMMUNITY): Payer: Self-pay

## 2022-11-02 DIAGNOSIS — C711 Malignant neoplasm of frontal lobe: Secondary | ICD-10-CM | POA: Diagnosis not present

## 2022-11-03 ENCOUNTER — Inpatient Hospital Stay: Payer: Medicare Other | Admitting: Hematology and Oncology

## 2022-11-03 ENCOUNTER — Inpatient Hospital Stay: Payer: Medicare Other

## 2022-11-03 DIAGNOSIS — C711 Malignant neoplasm of frontal lobe: Secondary | ICD-10-CM | POA: Diagnosis not present

## 2022-11-03 DIAGNOSIS — M5412 Radiculopathy, cervical region: Secondary | ICD-10-CM | POA: Diagnosis not present

## 2022-11-03 DIAGNOSIS — R569 Unspecified convulsions: Secondary | ICD-10-CM | POA: Diagnosis not present

## 2022-11-03 DIAGNOSIS — G43909 Migraine, unspecified, not intractable, without status migrainosus: Secondary | ICD-10-CM | POA: Diagnosis not present

## 2022-11-03 DIAGNOSIS — H547 Unspecified visual loss: Secondary | ICD-10-CM | POA: Diagnosis not present

## 2022-11-08 ENCOUNTER — Other Ambulatory Visit (HOSPITAL_COMMUNITY): Payer: Self-pay

## 2022-11-08 MED ORDER — CYCLOBENZAPRINE HCL 10 MG PO TABS
10.0000 mg | ORAL_TABLET | Freq: Three times a day (TID) | ORAL | 1 refills | Status: DC | PRN
  Filled 2022-11-08: qty 30, 10d supply, fill #0
  Filled 2022-11-14: qty 30, 10d supply, fill #1

## 2022-11-09 DIAGNOSIS — M5412 Radiculopathy, cervical region: Secondary | ICD-10-CM | POA: Diagnosis not present

## 2022-11-14 ENCOUNTER — Inpatient Hospital Stay: Payer: Medicare Other | Attending: Hematology and Oncology | Admitting: Hematology and Oncology

## 2022-11-14 ENCOUNTER — Other Ambulatory Visit: Payer: Medicare Other

## 2022-11-14 ENCOUNTER — Other Ambulatory Visit (HOSPITAL_COMMUNITY): Payer: Self-pay

## 2022-11-14 VITALS — BP 155/106 | HR 115 | Temp 98.1°F | Resp 18 | Ht 68.0 in | Wt 159.6 lb

## 2022-11-14 DIAGNOSIS — G40109 Localization-related (focal) (partial) symptomatic epilepsy and epileptic syndromes with simple partial seizures, not intractable, without status epilepticus: Secondary | ICD-10-CM | POA: Diagnosis not present

## 2022-11-14 DIAGNOSIS — Z885 Allergy status to narcotic agent status: Secondary | ICD-10-CM | POA: Insufficient documentation

## 2022-11-14 DIAGNOSIS — R519 Headache, unspecified: Secondary | ICD-10-CM | POA: Diagnosis not present

## 2022-11-14 DIAGNOSIS — Z79899 Other long term (current) drug therapy: Secondary | ICD-10-CM | POA: Diagnosis not present

## 2022-11-14 DIAGNOSIS — Z888 Allergy status to other drugs, medicaments and biological substances status: Secondary | ICD-10-CM | POA: Insufficient documentation

## 2022-11-14 DIAGNOSIS — C711 Malignant neoplasm of frontal lobe: Secondary | ICD-10-CM | POA: Insufficient documentation

## 2022-11-14 DIAGNOSIS — Z881 Allergy status to other antibiotic agents status: Secondary | ICD-10-CM | POA: Insufficient documentation

## 2022-11-14 MED ORDER — BUTALBITAL-APAP-CAFFEINE 50-325-40 MG PO TABS
1.0000 | ORAL_TABLET | Freq: Three times a day (TID) | ORAL | 0 refills | Status: DC | PRN
  Filled 2022-11-14: qty 90, 30d supply, fill #0

## 2022-11-15 ENCOUNTER — Other Ambulatory Visit: Payer: Self-pay

## 2022-11-15 ENCOUNTER — Encounter: Payer: Self-pay | Admitting: Hematology and Oncology

## 2022-11-15 DIAGNOSIS — M5412 Radiculopathy, cervical region: Secondary | ICD-10-CM | POA: Diagnosis not present

## 2022-11-15 DIAGNOSIS — M5416 Radiculopathy, lumbar region: Secondary | ICD-10-CM | POA: Diagnosis not present

## 2022-11-15 MED ORDER — BUTALBITAL-APAP-CAFFEINE 50-325-40 MG PO TABS: 1.0000 | ORAL_TABLET | Freq: Two times a day (BID) | ORAL | 0 refills | Status: DC | PRN

## 2022-11-15 NOTE — Assessment & Plan Note (Signed)
I have reviewed documentation from [facility] Due to signs of disease progression on recent imaging, the plan will be to get her started on treatment If she needs blood work to be done locally, we can arrange for that to be monitored under the guidance of her primary neuro oncologist

## 2022-11-15 NOTE — Assessment & Plan Note (Signed)
We have problems with polypharmacy in the past Over the years, I was able to successfully reduce the amount of medications that she is taking for her chronic headaches For now, we will keep her Soma at current dose We will continue hydrocodone at current dose She will continue lorazepam once a day or as needed She is in agreement for Fioricet taper starting next month to twice daily as needed She will continue treatment with gabapentin as directed by her neurologist

## 2022-11-15 NOTE — Assessment & Plan Note (Signed)
In the past few months since last time I saw her, she reported less seizures °She is able to tolerate gabapentin fairly well at current dose which I believe is helping with seizure control °I would defer to neurologist for further management °

## 2022-11-15 NOTE — Progress Notes (Signed)
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 I have reviewed documentation from [facility] Due to signs of disease progression on recent imaging, the plan will be to get her started on treatment If she needs blood work to be done locally, we can arrange for that to be monitored under the guidance of her primary neuro oncologist  Polypharmacy We have problems with polypharmacy in the past Over the years, I was able to successfully reduce the amount of medications that she is taking for her chronic headaches For now, we will keep her Soma at current dose We will continue hydrocodone at current dose She will continue lorazepam once a day or as needed She is in agreement for Fioricet taper starting next month to twice daily as needed She will continue treatment with gabapentin as directed by her neurologist  Simple partial seizures In the past few months since last time I saw her, she reported less seizures She is able to tolerate gabapentin fairly well at current dose which I believe is helping with seizure control I would defer to neurologist for further management  No orders of the defined types were placed in this encounter.   All questions were answered. The patient knows to call the clinic with any problems, questions or concerns. The total time spent in the appointment was 30 minutes  encounter with patients including review of chart and various tests results, discussions about plan of care and coordination of care plan   Artis Delay, MD 11/15/2022 6:30 PM  INTERVAL HISTORY: Please see below for problem oriented charting. she returns for symptom management and follow-up She is here accompanied by her husband I have reviewed documentation from [facility] regarding plan to start her on treatment She is wondering whether she can get her labs monitor here in the future Her chronic headaches and seizures are about the same We discussed role of medication taper and ultimately, we are in agreement to reduce the frequency of Fioricet  REVIEW OF SYSTEMS:   Constitutional: Denies fevers, chills or abnormal weight loss Eyes: Denies blurriness of vision Ears, nose, mouth, throat, and face: Denies mucositis or sore throat Respiratory: Denies cough, dyspnea or wheezes Cardiovascular: Denies palpitation, chest discomfort or lower extremity swelling Gastrointestinal:  Denies nausea, heartburn or change in bowel habits Skin: Denies abnormal skin rashes Lymphatics: Denies new lymphadenopathy or easy bruising Neurological:Denies numbness, tingling or new weaknesses Behavioral/Psych: Mood is stable, no new changes  All other systems were reviewed with the patient and are negative.  I have reviewed the past medical history, past surgical history, social history and family history with the patient and they are unchanged from previous note.  ALLERGIES:  is allergic to adhesive [tape], soma [carisoprodol], tramadol hcl, nurtec [rimegepant sulfate], latex, medical adhesive remover, percocet [oxycodone-acetaminophen], prednisone, shellfish allergy, topamax [topiramate], vancomycin, zofran [ondansetron hcl], and zomig [zolmitriptan].  MEDICATIONS:  Current Outpatient Medications  Medication Sig Dispense Refill   albuterol (VENTOLIN HFA) 108 (90 Base) MCG/ACT inhaler Inhale 1 puff into  the  lungs every 4 (four) hours as needed. 6.7 g 1   butalbital-acetaminophen-caffeine (FIORICET) 50-325-40 MG tablet Take 1 tablet by mouth 2 (two) times daily as needed for headache. Starting May 2024: plan to taper to BID prn 60 tablet 0   carisoprodol (SOMA) 350 MG tablet Take 1 tablet by mouth 3 times daily. 270 tablet 0   cyclobenzaprine (FLEXERIL) 10 MG tablet Take 1 tablet (10 mg) by mouth 3 times daily as needed for pain and spasms 30 tablet 1   divalproex (DEPAKOTE ER) 250 MG 24 hr tablet Take 2 tablets (500 mg total) by mouth 3 (three) times daily. 180 tablet 11   EPINEPHrine 0.3 mg/0.3 mL IJ SOAJ injection Inject as directed as needed for severe allergic reaction 2 each 1   fluticasone (CUTIVATE) 0.05 % cream Apply to affected area(s) twice daily as directed 60 g 0   gabapentin (NEURONTIN) 300 MG capsule Take 3 capsules by mouth in the morning, 3 capsules at noon,  and 3 capsules at bedtime 270 capsule 11   HYDROcodone-acetaminophen (NORCO) 10-325 MG tablet Take 1 tablet by mouth 3 times daily as needed. 90 tablet 0   LORazepam (ATIVAN) 0.5 MG tablet Take 1 tablet (0.5 mg total) by mouth daily as needed for anxiety. 30 tablet 0   propranolol (INDERAL) 20 MG tablet Take 1 tablet (20 mg total) by mouth as needed, if no effect after 1 hour take 2nd tablet. 180 tablet 0   ZOLMitriptan (ZOMIG) 2.5 MG tablet Take 1 tablet at onset of migraine. May repeat in 2 hours if headache persists or recurs. Do not take more than 3 tablets per week 10 tablet 6   No current facility-administered medications for this visit.    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   09/27/2013 Pathology Results   Accession: [id number] pathology showed grade 2 oligodendroglioma with mutant IDH1 protein, ATRX, OLIG 2 expression. p53 is negative, low  Ki-67 labeling index. Molecular SNP array studies demonstrate whole arm 1p19q codeletion."   10/28/2013 Imaging   Ct scan of head showed possible abscess.   11/06/2013 Imaging   Repeat CT scan of the head showed resolution of abscess.   01/23/2014 Surgery   Dr. Franky Macho perform a subtotal gross resection of the tumor.   01/24/2014 Imaging   Repeat imaging studies showed persistent and residual disease.   03/03/2014 - 09/01/2014 Chemotherapy   She was started on Temodar, 5 days every 28 days   03/13/2014 Imaging   Repeat MRI showed no evidence of progression of disease.   07/14/2014 Imaging   Repeat MRI showed no evidence of disease progression.   09/12/2014 Imaging   MRI of the liver show mild regression of the size of lesions, presumed to be benign   09/15/2014 Imaging   Repeat MRI of the brain show persistent abnormalities, suspicious for residual disease   03/02/2015 Imaging    MRI brain at [facility] show persistent abnormalities   08/10/2015 Imaging   Repeat MRI [facility] showed stable disease   08/24/2015 - 02/08/2016 Chemotherapy   She was started on Lomustine, Procarbazine and Vincristine chemotherapy   08/30/2015 Imaging   CT chest showed no evidence of aspiration pneumonia   11/18/2015 Imaging   MRI brain showed status post resection of right frontal lobe tumor without residual or recurrent tumor. 2. Otherwise negative  MRI of the brain.    03/02/2016 Imaging   Stable MRI post right frontal lobe tumor resection. Negative for recurrent tumor   06/01/2016 Imaging   Stable MRI post right frontal lobe tumor resection. No recurrent tumor identified   08/19/2016 Imaging   Unchanged examination without tumor recurrence.   11/17/2016 Imaging   Stable right frontal resection cavity. Stable focal mass effect within the right middle frontal gyrus at the superior margin of the cavity which may represent nonenhancing neoplasm. No evidence for tumor progression. Otherwise stable MRI of the brain    12/16/2016 Imaging   MR brain: 1. No acute intracranial abnormality. 2. Unchanged appearance of right frontal lobe resection cavity and surrounding hyperintense T2 weighted signal.   02/04/2017 Imaging   Repeat MRI Normal pituitary   Stable postop changes of right frontal tumor resection   05/12/2017 Imaging   The left ventricular size is normal. There is normal left ventricular wall thickness.  Left ventricular systolic function is normal. LV ejection fraction = 60-65%.  Left ventricular filling pattern is normal. The left ventricular wall motion is normal. The right ventricle is normal in size and function. There is no significant valvular stenosis or regurgitation No pulmonary hypertension. IVC size was normal. There is no pericardial effusion. There is no comparison study available   05/13/2017 Imaging   1.  No evidence of acute pulmonary thromboembolic disease. 2.  There is an 8 mm right lower lobe groundglass nodule. Statistically, in this age group, this is most likely to reflect benign etiologies such as a tiny focus of infection or nonspecific inflammation. 4 mm solid right upper lobe lung nodule. A CT scan could be obtained in 12 months to further evaluate if the patient has any known risk factors for malignancy. 3.  There is a 1.3 cm left adrenal nodule. This finding is incompletely characterized although and is slightly increased in size relative to the abdominal MRI from February 2016. Although this finding is most likely benign, recommend follow-up adrenal protocol CT in one year.   05/13/2017 Imaging   MRI brain 1.  No acute intracranial abnormality. 2.  Postsurgical changes of a right frontal craniotomy for resection of a reported oligodendroglioma. Hyperintense T2/FLAIR signal around the resection cavity and in the subcortical white matter of a gyrus along the superior margin of the cavity is unchanged from 01/05/2015. No evidence of disease progression.    12/15/2017 Imaging   MRI brain at [facility] 1.  No acute intracranial abnormality. 2.  Postsurgical changes of right frontal craniotomy for resection of reported oligodendroglioma. No evidence of disease progression   12/28/2017 Procedure   Successful right IJ vein Port-A-Cath explant.   04/24/2018 Imaging   Stable post treatment appearance, RIGHT frontal oligodendroglioma.  No posttraumatic sequelae are evident.   01/30/2019 Imaging   Stable appearance of treated right frontal oligodendroglioma. No change from 04/24/2018.   11/18/2019 Imaging   1. Stable postoperative changes of the right frontal lobe without evidence for residual or recurrent disease. 2. No acute intracranial abnormality or significant interval change.   03/18/2022 Imaging   1.  Stable exam from 07/28/2021. Compared to more remote prior exam from 2018, there is an area of T2 hyperintense signal in the white matter deep to the resection cavity that has mildly increased in extent. Recommend close attention to this region on follow-up; differential considerations for this finding include evolving posttreatment changes and slowly growing tumor.  2.  No acute intracranial abnormality.  PHYSICAL EXAMINATION: ECOG PERFORMANCE STATUS: 1 - Symptomatic but completely ambulatory  Vitals:   11/14/22 1508  BP: (!) 155/106  Pulse: (!) 115  Resp: 18  Temp: 98.1 F (36.7 C)  SpO2: 100%   Filed Weights   11/14/22 1508  Weight: 159 lb 9.6 oz (72.4 kg)    GENERAL:alert, no distress and comfortable NEURO: alert & oriented x 3 with fluent speech, no focal motor/sensory deficits  LABORATORY DATA:  I have reviewed the data as listed    Component Value Date/Time   NA 139 04/13/2022 2307   NA 140 08/07/2019 0000   NA 140 06/15/2017 1207   K 3.4 (L) 04/13/2022 2307   K 3.9 06/15/2017 1207   CL 101 04/13/2022 2307   CO2 30 04/13/2022 2307   CO2 24 06/15/2017 1207   GLUCOSE 91 04/13/2022 2307   GLUCOSE 85 06/15/2017  1207   BUN 7 04/13/2022 2307   BUN 12 08/07/2019 0000   BUN 5.8 (L) 06/15/2017 1207   CREATININE 0.55 04/13/2022 2307   CREATININE 0.76 02/24/2020 1018   CREATININE 0.7 06/15/2017 1207   CALCIUM 9.5 04/13/2022 2307   CALCIUM 8.5 06/15/2017 1207   PROT 7.1 04/13/2022 2307   PROT 6.7 06/15/2017 1207   ALBUMIN 4.1 04/13/2022 2307   ALBUMIN 3.9 06/15/2017 1207   AST 11 (L) 04/13/2022 2307   AST 17 02/24/2020 1018   AST 14 06/15/2017 1207   ALT 11 04/13/2022 2307   ALT 15 02/24/2020 1018   ALT 11 06/15/2017 1207   ALKPHOS 62 04/13/2022 2307   ALKPHOS 42 06/15/2017 1207   BILITOT 0.3 04/13/2022 2307   BILITOT 0.5 02/24/2020 1018   BILITOT 0.36 06/15/2017 1207   GFRNONAA >60 04/13/2022 2307   GFRNONAA >60 02/24/2020 1018   GFRAA >60 02/24/2020 1018    No results found for: "SPEP", "UPEP"  Lab Results  Component Value Date   WBC 8.3 04/13/2022   NEUTROABS 6.9 04/13/2022   HGB 13.4 04/13/2022   HCT 40.0 04/13/2022   MCV 97.6 04/13/2022   PLT 216 04/13/2022      Chemistry      Component Value Date/Time   NA 139 04/13/2022 2307   NA 140 08/07/2019 0000   NA 140 06/15/2017 1207   K 3.4 (L) 04/13/2022 2307   K 3.9 06/15/2017 1207   CL 101 04/13/2022 2307   CO2 30 04/13/2022 2307   CO2 24 06/15/2017 1207   BUN 7 04/13/2022 2307   BUN 12 08/07/2019 0000   BUN 5.8 (L) 06/15/2017 1207   CREATININE 0.55 04/13/2022 2307   CREATININE 0.76 02/24/2020 1018   CREATININE 0.7 06/15/2017 1207      Component Value Date/Time   CALCIUM 9.5 04/13/2022 2307   CALCIUM 8.5 06/15/2017 1207   ALKPHOS 62 04/13/2022 2307   ALKPHOS 42 06/15/2017 1207   AST 11 (L) 04/13/2022 2307   AST 17 02/24/2020 1018   AST 14 06/15/2017 1207   ALT 11 04/13/2022 2307   ALT 15 02/24/2020 1018   ALT 11 06/15/2017 1207   BILITOT 0.3 04/13/2022 2307   BILITOT 0.5 02/24/2020 1018   BILITOT 0.36 06/15/2017 1207

## 2022-11-16 ENCOUNTER — Telehealth: Payer: Self-pay

## 2022-11-16 ENCOUNTER — Other Ambulatory Visit (HOSPITAL_COMMUNITY): Payer: Self-pay

## 2022-11-16 DIAGNOSIS — M5412 Radiculopathy, cervical region: Secondary | ICD-10-CM | POA: Diagnosis not present

## 2022-11-16 NOTE — Telephone Encounter (Signed)
Pt called and states she is applying for a trial drug through [facility] and she has to have a WBC of 4 or greater. She is asking about an injection to boost white count, or hospitalization to build white count so she will be a candidate for the drug.She is asking for a call back to further discuss this. Advised pt I would make MD aware and the nurse tomorrow will be in touch. She verbalized thanks and understanding.

## 2022-11-17 DIAGNOSIS — D72819 Decreased white blood cell count, unspecified: Secondary | ICD-10-CM | POA: Diagnosis not present

## 2022-11-17 DIAGNOSIS — C719 Malignant neoplasm of brain, unspecified: Secondary | ICD-10-CM | POA: Diagnosis not present

## 2022-11-17 DIAGNOSIS — G40909 Epilepsy, unspecified, not intractable, without status epilepticus: Secondary | ICD-10-CM | POA: Diagnosis not present

## 2022-11-17 NOTE — Telephone Encounter (Signed)
Called and given below message. She verbalized understanding. She has appt at PCP today and is walking in now for the appt. She will ask them to check B12 level today and share the result with Dr. Bertis Ruddy. She appreciated the call.

## 2022-11-17 NOTE — Telephone Encounter (Signed)
Hi Victoria Bates,  Please call her We only prescribe GCSF to boost WBC for patients on chemo getting infection. Those injections will not be approved in her situation We can investigate why her WBC is low; if a cause such as low vitamin b12 is found, we can treat that

## 2022-11-18 ENCOUNTER — Encounter: Payer: Self-pay | Admitting: Hematology and Oncology

## 2022-11-18 DIAGNOSIS — C711 Malignant neoplasm of frontal lobe: Secondary | ICD-10-CM | POA: Diagnosis not present

## 2022-11-22 ENCOUNTER — Other Ambulatory Visit (HOSPITAL_COMMUNITY): Payer: Self-pay

## 2022-11-22 ENCOUNTER — Other Ambulatory Visit: Payer: Self-pay | Admitting: Hematology and Oncology

## 2022-11-22 DIAGNOSIS — C719 Malignant neoplasm of brain, unspecified: Secondary | ICD-10-CM

## 2022-11-23 ENCOUNTER — Other Ambulatory Visit (HOSPITAL_COMMUNITY): Payer: Self-pay

## 2022-11-23 MED ORDER — HYDROCODONE-ACETAMINOPHEN 10-325 MG PO TABS
1.0000 | ORAL_TABLET | Freq: Three times a day (TID) | ORAL | 0 refills | Status: DC | PRN
Start: 2022-11-23 — End: 2022-12-21
  Filled 2022-11-23 – 2022-11-25 (×2): qty 90, 30d supply, fill #0

## 2022-11-23 MED ORDER — CYCLOBENZAPRINE HCL 10 MG PO TABS
10.0000 mg | ORAL_TABLET | Freq: Three times a day (TID) | ORAL | 1 refills | Status: DC | PRN
  Filled 2022-11-23: qty 30, 10d supply, fill #0
  Filled 2022-12-02: qty 30, 10d supply, fill #1

## 2022-11-25 ENCOUNTER — Other Ambulatory Visit (HOSPITAL_COMMUNITY): Payer: Self-pay

## 2022-11-25 MED ORDER — PROPRANOLOL HCL 20 MG PO TABS
ORAL_TABLET | ORAL | 2 refills | Status: DC
  Filled 2022-11-25: qty 180, 30d supply, fill #0
  Filled 2023-01-10: qty 180, 30d supply, fill #1
  Filled 2023-02-06: qty 180, 30d supply, fill #2

## 2022-11-26 ENCOUNTER — Other Ambulatory Visit (HOSPITAL_COMMUNITY): Payer: Self-pay

## 2022-12-02 ENCOUNTER — Other Ambulatory Visit (HOSPITAL_COMMUNITY): Payer: Self-pay

## 2022-12-09 ENCOUNTER — Other Ambulatory Visit (HOSPITAL_COMMUNITY): Payer: Self-pay

## 2022-12-09 MED ORDER — CYCLOBENZAPRINE HCL 10 MG PO TABS
10.0000 mg | ORAL_TABLET | Freq: Three times a day (TID) | ORAL | 1 refills | Status: DC | PRN
  Filled 2022-12-09: qty 30, 10d supply, fill #0
  Filled 2022-12-15 – 2022-12-16 (×2): qty 30, 10d supply, fill #1

## 2022-12-15 ENCOUNTER — Other Ambulatory Visit (HOSPITAL_COMMUNITY): Payer: Self-pay

## 2022-12-16 ENCOUNTER — Other Ambulatory Visit: Payer: Self-pay | Admitting: Hematology and Oncology

## 2022-12-16 ENCOUNTER — Encounter: Payer: Self-pay | Admitting: Hematology and Oncology

## 2022-12-16 ENCOUNTER — Other Ambulatory Visit (HOSPITAL_BASED_OUTPATIENT_CLINIC_OR_DEPARTMENT_OTHER): Payer: Self-pay

## 2022-12-16 ENCOUNTER — Other Ambulatory Visit (HOSPITAL_COMMUNITY): Payer: Self-pay

## 2022-12-16 MED ORDER — ALBUTEROL SULFATE HFA 108 (90 BASE) MCG/ACT IN AERS
INHALATION_SPRAY | RESPIRATORY_TRACT | 1 refills | Status: DC
  Filled 2022-12-16: qty 6.7, 34d supply, fill #0
  Filled 2023-01-10: qty 6.7, 34d supply, fill #1

## 2022-12-16 MED ORDER — EPINEPHRINE 0.3 MG/0.3ML IJ SOAJ
INTRAMUSCULAR | 1 refills | Status: DC
  Filled 2022-12-16: qty 2, 30d supply, fill #0
  Filled 2023-01-10: qty 2, 30d supply, fill #1

## 2022-12-16 MED ORDER — BUTALBITAL-APAP-CAFFEINE 50-325-40 MG PO TABS
1.0000 | ORAL_TABLET | Freq: Two times a day (BID) | ORAL | 0 refills | Status: DC | PRN
  Filled 2022-12-16: qty 60, 30d supply, fill #0

## 2022-12-20 ENCOUNTER — Other Ambulatory Visit (HOSPITAL_COMMUNITY): Payer: Self-pay

## 2022-12-21 ENCOUNTER — Other Ambulatory Visit: Payer: Self-pay | Admitting: Hematology and Oncology

## 2022-12-21 ENCOUNTER — Other Ambulatory Visit (HOSPITAL_COMMUNITY): Payer: Self-pay

## 2022-12-21 DIAGNOSIS — R519 Headache, unspecified: Secondary | ICD-10-CM

## 2022-12-21 DIAGNOSIS — R0789 Other chest pain: Secondary | ICD-10-CM

## 2022-12-21 DIAGNOSIS — C711 Malignant neoplasm of frontal lobe: Secondary | ICD-10-CM

## 2022-12-21 DIAGNOSIS — C719 Malignant neoplasm of brain, unspecified: Secondary | ICD-10-CM

## 2022-12-21 DIAGNOSIS — R110 Nausea: Secondary | ICD-10-CM

## 2022-12-21 DIAGNOSIS — R202 Paresthesia of skin: Secondary | ICD-10-CM

## 2022-12-21 DIAGNOSIS — R4702 Dysphasia: Secondary | ICD-10-CM

## 2022-12-21 MED ORDER — CARISOPRODOL 350 MG PO TABS
350.0000 mg | ORAL_TABLET | Freq: Three times a day (TID) | ORAL | 0 refills | Status: DC
Start: 2022-12-21 — End: 2023-04-04
  Filled 2022-12-21: qty 270, 90d supply, fill #0

## 2022-12-21 MED ORDER — HYDROCODONE-ACETAMINOPHEN 10-325 MG PO TABS
1.0000 | ORAL_TABLET | Freq: Three times a day (TID) | ORAL | 0 refills | Status: DC | PRN
Start: 2022-12-21 — End: 2023-01-17
  Filled 2022-12-21: qty 90, 30d supply, fill #0

## 2022-12-21 MED ORDER — CYCLOBENZAPRINE HCL 10 MG PO TABS
10.0000 mg | ORAL_TABLET | Freq: Three times a day (TID) | ORAL | 1 refills | Status: DC | PRN
  Filled 2022-12-21 – 2022-12-23 (×4): qty 30, 10d supply, fill #0
  Filled 2023-01-10 (×2): qty 30, 10d supply, fill #1

## 2022-12-22 ENCOUNTER — Other Ambulatory Visit (HOSPITAL_COMMUNITY): Payer: Self-pay

## 2022-12-23 ENCOUNTER — Other Ambulatory Visit (HOSPITAL_COMMUNITY): Payer: Self-pay

## 2022-12-27 ENCOUNTER — Other Ambulatory Visit (HOSPITAL_COMMUNITY): Payer: Self-pay

## 2022-12-27 ENCOUNTER — Encounter: Payer: Self-pay | Admitting: Neurology

## 2022-12-27 ENCOUNTER — Ambulatory Visit (INDEPENDENT_AMBULATORY_CARE_PROVIDER_SITE_OTHER): Payer: Medicare Other | Admitting: Neurology

## 2022-12-27 VITALS — BP 124/74 | HR 114 | Ht 68.0 in | Wt 164.8 lb

## 2022-12-27 DIAGNOSIS — R4789 Other speech disturbances: Secondary | ICD-10-CM | POA: Diagnosis not present

## 2022-12-27 DIAGNOSIS — R1310 Dysphagia, unspecified: Secondary | ICD-10-CM

## 2022-12-27 DIAGNOSIS — C719 Malignant neoplasm of brain, unspecified: Secondary | ICD-10-CM | POA: Diagnosis not present

## 2022-12-27 DIAGNOSIS — G40109 Localization-related (focal) (partial) symptomatic epilepsy and epileptic syndromes with simple partial seizures, not intractable, without status epilepticus: Secondary | ICD-10-CM | POA: Diagnosis not present

## 2022-12-27 DIAGNOSIS — G43719 Chronic migraine without aura, intractable, without status migrainosus: Secondary | ICD-10-CM

## 2022-12-27 MED ORDER — GABAPENTIN 300 MG PO CAPS
ORAL_CAPSULE | ORAL | 11 refills | Status: DC
Start: 2022-12-27 — End: 2024-01-23
  Filled 2022-12-27 – 2023-01-10 (×2): qty 300, 30d supply, fill #0
  Filled 2023-02-10 (×2): qty 300, 30d supply, fill #1
  Filled 2023-03-16: qty 300, 30d supply, fill #2
  Filled 2023-05-04 – 2023-05-05 (×2): qty 300, 30d supply, fill #3
  Filled 2023-06-06 – 2023-06-07 (×2): qty 300, 30d supply, fill #4
  Filled 2023-07-08 (×2): qty 300, 30d supply, fill #5
  Filled 2023-08-11 (×2): qty 300, 30d supply, fill #6
  Filled 2023-09-05: qty 300, 30d supply, fill #7
  Filled 2023-10-13: qty 300, 30d supply, fill #8
  Filled 2023-11-14: qty 300, 30d supply, fill #9
  Filled 2023-12-14 – 2023-12-16 (×2): qty 300, 30d supply, fill #10

## 2022-12-27 MED ORDER — DIVALPROEX SODIUM ER 250 MG PO TB24
500.0000 mg | ORAL_TABLET | Freq: Three times a day (TID) | ORAL | 11 refills | Status: DC
Start: 2022-12-27 — End: 2024-01-23
  Filled 2022-12-27 – 2023-01-10 (×2): qty 180, 30d supply, fill #0
  Filled 2023-02-10 (×2): qty 180, 30d supply, fill #1
  Filled 2023-03-16: qty 180, 30d supply, fill #2
  Filled 2023-05-04 – 2023-05-05 (×2): qty 180, 30d supply, fill #3
  Filled 2023-06-06 – 2023-06-07 (×2): qty 180, 30d supply, fill #4
  Filled 2023-07-25: qty 180, 30d supply, fill #5
  Filled 2023-09-05: qty 180, 30d supply, fill #6
  Filled 2023-10-13: qty 180, 30d supply, fill #7
  Filled 2023-12-06: qty 180, 30d supply, fill #8

## 2022-12-27 MED ORDER — ZOLMITRIPTAN 5 MG PO TABS
ORAL_TABLET | ORAL | 11 refills | Status: DC
Start: 2022-12-27 — End: 2024-01-23
  Filled 2022-12-27: qty 10, 21d supply, fill #0
  Filled 2023-01-24: qty 10, 21d supply, fill #1
  Filled 2023-02-19: qty 10, 21d supply, fill #2
  Filled 2023-02-20: qty 8, 29d supply, fill #2
  Filled 2023-02-20: qty 2, 1d supply, fill #2
  Filled 2023-05-04 – 2023-05-05 (×2): qty 10, 30d supply, fill #3
  Filled 2023-06-06 – 2023-06-07 (×2): qty 10, 30d supply, fill #4
  Filled 2023-07-25: qty 10, 30d supply, fill #5
  Filled 2023-08-11: qty 10, 21d supply, fill #6
  Filled 2023-08-11: qty 10, 30d supply, fill #6
  Filled 2023-09-05 – 2023-09-20 (×3): qty 10, 30d supply, fill #7
  Filled 2023-10-13: qty 10, 30d supply, fill #8
  Filled 2023-11-14: qty 10, 30d supply, fill #9
  Filled 2023-12-14 – 2023-12-16 (×2): qty 10, 30d supply, fill #10

## 2022-12-27 NOTE — Patient Instructions (Signed)
Good to see you. Wishing you all the best.   Schedule swallow evaluation  2. Referral will be sent for Speech therapy  3. Increase Gabapentin 300mg : take 3 caps in AM, 3 caps at noon, 4 caps at bedtime  4. Continue Depakote 250mg : take 2 tablets three times a day  5. Refills sent for Zomig 5mg  as needed for migraine  6. Follow-up in 6-8 months, call for any changes   Seizure Precautions: 1. If medication has been prescribed for you to prevent seizures, take it exactly as directed.  Do not stop taking the medicine without talking to your doctor first, even if you have not had a seizure in a long time.   2. Avoid activities in which a seizure would cause danger to yourself or to others.  Don't operate dangerous machinery, swim alone, or climb in high or dangerous places, such as on ladders, roofs, or girders.  Do not drive unless your doctor says you may.  3. If you have any warning that you may have a seizure, lay down in a safe place where you can't hurt yourself.    4.  No driving for 6 months from last seizure, as per [facility].   Please refer to the following link on the [employer]'s website for more information: http://www.epilepsyfoundation.org/answerplace/Social/driving/drivingu.cfm   5.  Maintain good sleep hygiene. Avoid alcohol.  6.  Notify your neurology if you are planning pregnancy or if you become pregnant.  7.  Contact your doctor if you have any problems that may be related to the medicine you are taking.  8.  Call 911 and bring the patient back to the ED if:        A.  The seizure lasts longer than 5 minutes.       B.  The patient doesn't awaken shortly after the seizure  C.  The patient has new problems such as difficulty seeing, speaking or moving  D.  The patient was injured during the seizure  E.  The patient has a temperature over 102 F (39C)  F.  The patient vomited and now is having trouble breathing

## 2022-12-27 NOTE — Progress Notes (Signed)
NEUROLOGY FOLLOW UP OFFICE NOTE  Victoria Bates [id number] 01-29-1981  HISTORY OF PRESENT ILLNESS: I had the pleasure of seeing Victoria Bates in follow-up in the neurology clinic on 12/27/2022.  The patient was last seen 8 months ago. Records and images were personally reviewed where available.  Her last brain MRI with and without contrast done at [facility] 10/2022 reported no findings specific for progressive neoplasm since 2023; however, nonenhancing right frontal T2/FLAIR hyperintense infiltrative tumor has slowly increased compared to 2018. Due to this, Vorasidenib via compassionate use program has been discussed, she is still awaiting approval. She cannot recall the last time she had a seizure. Her biggest issues are her speech and tremor. She notes swallowing is a real problem, everything small gets stuck. She avoids rice and corn, and would take a bite of something solid to push food down. She denies any weight loss. She notes that speech changes are more of word-finding, but also it feels like the right side of her face wants to move really fast, but the left side can't catch up. She notes tremors in her hand and right side of face. The migraines are "not great," however she has been unable to restart the Emgality because she cannot start new medication as she awaits Vorasidenib. She has prn Zomig which knocks the migraine out. She has prn Fioricet, Soma, and lorazepam prescribed by Dr. Bertis Ruddy. She continues on Depakote 500mg  TID and Gabapentin 900mg  TID for seizure and headache prophylaxis. No side effects. She is having more back pain, she cannot take NSAIDs or get epidural injections in preparation for the Vorasidenib, so back pain has been worse. She is taking Flexeril TID on a regular basis. No recent falls.    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 She  was admitted to [facility] last 09/19/2013 due to left vision changes and left facial heaviness. She reports that she had a 60-lb unintentional weight loss 4 months leading up to her admission, she was vomiting after every meal. She started having episodes where her hands would have low amplitude tremors and she feels the right lower face twitching. She would need to pause and take a minute to get words out. If she did not stop what she was doing, the symptoms could last for hours. She would feel like her body was "going 90 mph" with palpitations up to 200 bpm. She was started on Xanax for anxiety, but this did not help. She then started having vision changes on her left eye, she would park crookedly when she thought she was going straight. She saw an ophthalmologist, eye exam was normal but left optic neuritis was questioned and brain imaging recommended. She then started to have left facial heaviness and numbness on the left face, arm, and leg, and drove herself to [facility] where she was found to have a right frontal lesion. She underwent subtotal resection on 01/23/14 by Dr. Franky Macho and was started on Keppra for seizure prophylaxis at that time. She continued to have the recurrent episodes of tremors in both arms, palpitations, and right facial tightness, as well as episodes of left face, neck, shoulder, and arm numbness, going down the left thumb and index finger. She describes her major seizures as "all of that times 100." After the episodes, she would be tired for a few days, wanting to sleep, with no energy. She  has noticed that her short term memory is "gone." Keppra dose had been increased up to 2 grams daily, and this had caused a change in personality. She had seen neurologist Dr. Pearlean Brownie, who switched her to Depakote. She tapered off Keppra and is currently on Depakote ER 500mg  BID with significant improvement in symptoms. She denies any side effects to the Depakote.    She has a history of recurrent headaches since  her teenage years. Headaches are usually over the right temporal and occipital region, with sharp stabbing and throbbing pain, with a knot in her left neck region. There is occasional nausea, photo, and phonophobia. She was having these headaches around 1-2 times a week when younger, however since December 2014, headaches have been occurring around 2-3 times a week. She had been receiving injections in her neck for this. If she takes medication at the onset, they last a few hours, however they may last up to several days. She has found that taking Decadron for a couple of days has been the only helpful medication. She takes Fioricet rarely. Zomig, aspirin, and Tylenol were ineffective in the past. Her father and 22 year old son have headaches. She denies any diplopia, dysarthria, dysphagia, bowel/bladder dysfunction. She has chronic back pain and had been taking Soma qid for several years, with worsening of pain when she tried reducing this. She was started on Gabapentin for the seizures and back pain, she is unsure if this helps. She had been taking Ultram for the pain, and once stopping this, she noticed an improvement in the seizures as well.     Diagnostic Data: Routine EEG done at [facility] reported amplitude asymmetry in the right frontotemporal regions which is likely from breach artifact from prior craniotomy. There is intermittent right frontal sharp activity but no definite epileptiform activity. Impression: Mild right hemispheric focal cortical irritability but no definitive epileptiform activity is noted.     Her 48-hour EEG did not show any epileptiform discharges. It showed breach artifact consistent with prior surgery. She reported numerous symptoms including headache, severe muscle spasm under the base of her skull on the right side, tremors in both hands, jerking and pause in jaw (right side), left leg/inner thigh and left foot warm and pigmented, eyes twitching, fatigue, with no associated  epileptiform correlate seen.  Inpatient video EEG monitoring for 3 days at [facility], June 23 to 25, 2016: Depakote and Gabapentin were held. Typical events were not clearly captured, however she reported 2 spells where she reported dizziness and visual changes (tiles on floor looked uneven) with no EEG correlate.  Baseline EEG showed breach rhythm over the right hemisphere, no epileptiform discharges or electrographic seizures.   72-EEG at [facility] done 02/14/20 to 02/17/20 with baseline EEG showing breach rhythm over the right frontocentral region, no epileptiform discharges. There were numerous events and push buttons for headaches, dizziness, jerking jaw, shakes, tremors, jerk in speech, word loss, vision/depth perception changes, with no EEG correlate seen.   I personally reviewed MRI brain with and without contrast done 03/13/14 which showed post-op right-sided craniotomy. The surgical cavity in the right frontal lobe is filled with fluid, with note of improvement in layering blood products since prior study. The study of 01/24/2014 showed restricted diffusion deep to the tumor. This is consistent with acute infarct which is resolving at this time. No acute infarct is present on today's study. No evidence of recurrent or residual tumor although the original tumor did not enhance. No mass effect or  edema is present that would suggest recurrent tumor. Small amount of subarachnoid hemorrhage on the right again noted.  MRI 11/2015 which I personally reviewed, there is similar pattern of enhancement and abnormal FLAIR signal in the right frontal gyrus immediately superior to the resection, stable from previous scan.    Brain MRI with and without contrast done 11/18/2020 and 07/2021 showed similar post-treatment changes s/p right frontal lobe oligodendroglioma debulking, no evidence of recurrent or progressive tumor.   Epilepsy Risk Factors:  Right frontal oligodendroglioma s/p subtotal resection. Otherwise she  had a normal birth and early development.  There is no history of febrile convulsions, CNS infections such as meningitis/encephalitis, or family history of seizures.  Prior medications: Topamax ("I can't take that", made her very tired) Prior migraine preventative medications: Topiramate, Gabapentin, Depakote, Botox  PAST MEDICAL HISTORY: Past Medical History:  Diagnosis Date   Adrenal tumor    Anemia    Anxiety    Asthma    very mild- no inhaler use since July 2013   Brain tumor [facility])    Hx: of   Cancer (HCC) 2003   skin   Chronic fatigue 10/03/2014   Cognitive impairment 08/13/2015   Complication of anesthesia    takes longer for patient to go under anesthesia   Dizziness    Dysphasia 02/12/2014   Glioma, malignant (HCC) dx'd 09/2013   Headache 02/12/2014   Headache(784.0)    migraines   Herniated disc    Hot flashes 10/03/2014   Infection    to right head incision   Lactation disorder 10/03/2014   Liver hemangioma    Liver lesion 08/22/2014   Nausea alone 02/12/2014   Neuropathic pain 04/08/2014   Petit mal seizure status (HCC)    Seizures (HCC)    petit mal serizures most days    MEDICATIONS: Current Outpatient Medications on File Prior to Visit  Medication Sig Dispense Refill   albuterol (VENTOLIN HFA) 108 (90 Base) MCG/ACT inhaler Inhale 1 puff into the lungs every 4 (four) hours as needed. 6.7 g 1   butalbital-acetaminophen-caffeine (FIORICET) 50-325-40 MG tablet Take 1 tablet by mouth 2 (two) times daily as needed for headache. 60 tablet 0   carisoprodol (SOMA) 350 MG tablet Take 1 tablet by mouth 3 times daily. 270 tablet 0   cyclobenzaprine (FLEXERIL) 10 MG tablet Take 1 tablet (10 mg) by mouth 3 times daily as needed for pain and spasms 30 tablet 1   divalproex (DEPAKOTE ER) 250 MG 24 hr tablet Take 2 tablets (500 mg total) by mouth 3 (three) times daily. 180 tablet 11   EPINEPHrine 0.3 mg/0.3 mL IJ SOAJ injection Inject as directed as needed for severe allergic  reaction 2 each 1   fluticasone (CUTIVATE) 0.05 % cream Apply to affected area(s) twice daily as directed 60 g 0   gabapentin (NEURONTIN) 300 MG capsule Take 3 capsules by mouth in the morning, 3 capsules at noon,  and 3 capsules at bedtime 270 capsule 11   HYDROcodone-acetaminophen (NORCO) 10-325 MG tablet Take 1 tablet by mouth 3 times daily as needed. 90 tablet 0   LORazepam (ATIVAN) 0.5 MG tablet Take 1 tablet (0.5 mg total) by mouth daily as needed for anxiety. 30 tablet 0   propranolol (INDERAL) 20 MG tablet Take 1 tablet (20 mg) by mouth as needed, if no effect after 1 hour take 2nd tablet. 180 tablet 2   ZOLMitriptan (ZOMIG) 2.5 MG tablet Take 1 tablet at onset of migraine. May repeat  in 2 hours if headache persists or recurs. Do not take more than 3 tablets per week 10 tablet 6   No current facility-administered medications on file prior to visit.    ALLERGIES: Allergies  Allergen Reactions   Adhesive [Tape] Other (See Comments)    Skin tear   Soma [Carisoprodol]     Watson brand, specifically, causes disorientation (can take other brands)   Tramadol Hcl Other (See Comments)    Potentially worsen seizure control   Nurtec [Rimegepant Sulfate]    Latex Hives   Medical Adhesive Remover     Per patient states reaction to dermabond when had hysterectomy. Stated was red and irritated.    Percocet [Oxycodone-Acetaminophen] Other (See Comments)    makes pt angry   Prednisone Nausea And Vomiting   Shellfish Allergy Diarrhea and Nausea And Vomiting   Topamax [Topiramate]    Vancomycin Hives   Zofran [Ondansetron Hcl]     Pt. Stated side effects : abdominal pain / constipation    Zomig [Zolmitriptan] Hives    Blisters on face when she uses it     FAMILY HISTORY: Family History  Problem Relation Age of Onset   Hypertension Mother    Skin cancer Mother    Thyroid cancer Father    Skin cancer Father    Breast cancer Maternal Aunt        dx in her 42s   Prostate cancer Maternal  Uncle 18   Melanoma Maternal Uncle    Ovarian cancer Maternal Grandmother        dx in her late 3s-40s   Breast cancer Maternal Aunt        bilateral breast cancer dx in 30s and 70s; possibly BRCA+   Other Son        multiple CAL spots, bright spots on brain MRI, ? chiari malformation    SOCIAL HISTORY: Social History   Socioeconomic History   Marital status: Married    Spouse name: Not on file   Number of children: 2   Years of education: College   Highest education level: Not on file  Occupational History   Occupation: Press photographer: [employer]  Tobacco Use   Smoking status: Never   Smokeless tobacco: Never  Vaping Use   Vaping Use: Never used  Substance and Sexual Activity   Alcohol use: No    Alcohol/week: 0.0 standard drinks of alcohol   Drug use: No   Sexual activity: Yes    Partners: Male    Birth control/protection: Surgical  Other Topics Concern   Not on file  Social History Narrative   Patient lives at home with her two children.in a two story rarely goes up stairs   Caffeine use: minimum of 4 cups daily   Right handed     Are you currently employed ? No           Social Determinants of Corporate investment banker Strain: Not on file  Food Insecurity: Not on file  Transportation Needs: Not on file  Physical Activity: Not on file  Stress: Not on file  Social Connections: Not on file  Intimate Partner Violence: Not on file     PHYSICAL EXAM: Vitals:   12/27/22 1014  BP: 124/74  Pulse: (!) 114  SpO2: 97%   General: No acute distress Head:  Normocephalic/atraumatic Skin/Extremities: No rash, no edema Neurological Exam: alert and awake. No aphasia or dysarthria. Fund of knowledge is appropriate. Attention and  concentration are normal.   Cranial nerves: Pupils equal, round. Extraocular movements intact with no nystagmus.No facial asymmetry.  Motor: moves all extremities symmetrically, at least anti-gravity x 4. Gait narrow-based  and steady, no ataxia.   IMPRESSION: This is a 42 yo RH woman with a history of grade 2 oligodengroglioma s/p subtotal resection, anxiety, chronic migraine, with recurrent episodes of low amplitude tremors, palpitations, and right facial tightening, as well as left face and arm numbness. She has had several prolonged EEGs which have not shown any seizure activity, her last 72-hour EEG at [facility] in 01/2020 showed breach rhythm over the right frontocentral region, no epileptiform discharges, numerous events and push buttons for headaches, dizziness, jerking jaw, shakes, tremors, jerk in speech, word loss, vision/depth perception changes, with no EEG correlate seen.She is awaiting Vorasidenib for slowly progressive changes seen on recent brain MRI. She does not recall the last time she had a seizure. She continues to have migraines but states she cannot restart Emgality or any new medications as she awaits Vorasidenib. Main concern today are speech/swallow changes and more back pain. Swallow evaluation and speech therapy will be ordered. Increase Gabapentin 300mg : Take 3 caps in AM, 3 caps at noon, 4 caps at bedtime. Continue Depakote 250mg  2 tablets three times a day. She has prn Zomig for migraine rescue. She is aware of [place] driving laws to stop driving after a seizure until 6 months seizure-free. Follow-up in 6-8 months call for any changes.    Thank you for allowing me to participate in her care.  Please do not hesitate to call for any questions or concerns.    Patrcia Dolly, M.D.   CC: Evangeline Dakin, [place], Dr. Bertis Ruddy

## 2022-12-28 ENCOUNTER — Other Ambulatory Visit (HOSPITAL_COMMUNITY): Payer: Self-pay

## 2022-12-28 ENCOUNTER — Other Ambulatory Visit: Payer: Self-pay

## 2022-12-28 DIAGNOSIS — R059 Cough, unspecified: Secondary | ICD-10-CM

## 2022-12-28 DIAGNOSIS — R1310 Dysphagia, unspecified: Secondary | ICD-10-CM

## 2023-01-04 ENCOUNTER — Other Ambulatory Visit (HOSPITAL_COMMUNITY): Payer: Self-pay

## 2023-01-04 ENCOUNTER — Telehealth: Payer: Self-pay

## 2023-01-04 MED ORDER — LOPERAMIDE HCL 2 MG PO CAPS
2.0000 mg | ORAL_CAPSULE | Freq: Four times a day (QID) | ORAL | 0 refills | Status: DC | PRN
  Filled 2023-01-04: qty 30, 8d supply, fill #0

## 2023-01-04 MED ORDER — PROMETHAZINE HCL 12.5 MG PO TABS
ORAL_TABLET | ORAL | 0 refills | Status: DC
  Filled 2023-01-04: qty 30, 10d supply, fill #0

## 2023-01-04 MED ORDER — ONDANSETRON HCL 8 MG PO TABS
8.0000 mg | ORAL_TABLET | Freq: Two times a day (BID) | ORAL | 3 refills | Status: DC | PRN
  Filled 2023-01-04: qty 30, 15d supply, fill #0
  Filled 2023-01-10 (×2): qty 30, 15d supply, fill #1

## 2023-01-04 NOTE — Telephone Encounter (Signed)
Received a call from Carollee Herter, RN with [facility] in [place]. Carollee Herter reports that Pt will need weekly labs (CBCD, CMP, and CK) as well as weekly EKGs and is asking if Pt can have these done with [place]. The managing provider to CC results to would be Dr. Gerrit Heck. Advised that we will verify with MD. RN verbalized understanding. Carollee Herter reports return call back number as [phone].

## 2023-01-05 ENCOUNTER — Other Ambulatory Visit (HOSPITAL_COMMUNITY): Payer: Self-pay

## 2023-01-05 ENCOUNTER — Other Ambulatory Visit: Payer: Self-pay

## 2023-01-05 NOTE — Telephone Encounter (Signed)
Hi Victoria Bates,  Please call her back I advised her to go to [facility] at [facility] We are not able to get EKG arranged weekly here

## 2023-01-05 NOTE — Telephone Encounter (Signed)
Called back and spoke with Carollee Herter, given below message from Dr. Bertis Ruddy. She verbalized understandng and will make other arrangements.

## 2023-01-10 ENCOUNTER — Other Ambulatory Visit: Payer: Self-pay | Admitting: Hematology and Oncology

## 2023-01-10 ENCOUNTER — Other Ambulatory Visit (HOSPITAL_COMMUNITY): Payer: Self-pay

## 2023-01-10 ENCOUNTER — Other Ambulatory Visit: Payer: Self-pay

## 2023-01-10 DIAGNOSIS — C719 Malignant neoplasm of brain, unspecified: Secondary | ICD-10-CM | POA: Diagnosis not present

## 2023-01-10 MED ORDER — BUTALBITAL-APAP-CAFFEINE 50-325-40 MG PO TABS
1.0000 | ORAL_TABLET | Freq: Two times a day (BID) | ORAL | 0 refills | Status: DC | PRN
  Filled 2023-01-10: qty 60, 30d supply, fill #0

## 2023-01-10 MED ORDER — LORAZEPAM 0.5 MG PO TABS
0.5000 mg | ORAL_TABLET | Freq: Every day | ORAL | 0 refills | Status: DC | PRN
  Filled 2023-01-10: qty 30, 30d supply, fill #0

## 2023-01-11 ENCOUNTER — Other Ambulatory Visit: Payer: Self-pay

## 2023-01-13 ENCOUNTER — Ambulatory Visit: Payer: Medicare Other | Attending: Speech Pathology | Admitting: Speech Pathology

## 2023-01-13 NOTE — Therapy (Deleted)
OUTPATIENT SPEECH LANGUAGE PATHOLOGY EVALUATION   Patient Name: Victoria Bates MRN: [id number] DOB:1981-02-26, 42 y.o., female Today's Date: 01/13/2023  PCP: Wilfred Lacy Provider (PCP)  REFERRING PROVIDER: Dayna Ramus, LPN   END OF SESSION:   Past Medical History:  Diagnosis Date   Adrenal tumor    Anemia    Anxiety    Asthma    very mild- no inhaler use since July 2013   Brain tumor [facility])    Hx: of   Cancer (HCC) 2003   skin   Chronic fatigue 10/03/2014   Cognitive impairment 08/13/2015   Complication of anesthesia    takes longer for patient to go under anesthesia   Dizziness    Dysphasia 02/12/2014   Glioma, malignant (HCC) dx'd 09/2013   Headache 02/12/2014   Headache(784.0)    migraines   Herniated disc    Hot flashes 10/03/2014   Infection    to right head incision   Lactation disorder 10/03/2014   Liver hemangioma    Liver lesion 08/22/2014   Nausea alone 02/12/2014   Neuropathic pain 04/08/2014   Petit mal seizure status (HCC)    Seizures (HCC)    petit mal serizures most days   Past Surgical History:  Procedure Laterality Date   ABDOMINAL HYSTERECTOMY     CESAREAN SECTION     COLONOSCOPY WITH PROPOFOL N/A 08/29/2012   Procedure: COLONOSCOPY WITH PROPOFOL;  Surgeon: Willis Modena, MD;  Location: [facility];  Service: Endoscopy;  Laterality: N/A;   coposcopy  08/06/2012   cervical biopsy   CRANIOTOMY Right 09/26/2013   Procedure: Awake Right frontal open brain biopsy;  Surgeon: [name] Hurt, MD;  [facility]: [facility];  Service: Neurosurgery;  Laterality: Right;  awake Right frontal open brain biopsy   CRANIOTOMY N/A 10/30/2013   Procedure: debridement of cranial wound  right temporal incision, ;  Surgeon: [name] Hurt, MD;  Location: [facility] OR;  Service: Neurosurgery;  Laterality: N/A;   CRANIOTOMY N/A 01/23/2014   Procedure: Awake Craniotomy with BrainLab;  Surgeon: [name] Hurt, MD;  [facility]: [facility];  Service: Neurosurgery;   Laterality: N/A;  Awake Craniotomy  tumor with BrainLab   ESOPHAGOGASTRODUODENOSCOPY (EGD) WITH PROPOFOL N/A 08/29/2012   Procedure: ESOPHAGOGASTRODUODENOSCOPY (EGD) WITH PROPOFOL;  Surgeon: Willis Modena, MD;  [facility];  Service: Endoscopy;  Laterality: N/A;   EYE SURGERY     6 surgeries   IR RADIOLOGY PERIPHERAL GUIDED IV START  12/28/2017   IR REMOVAL TUN ACCESS W/ PORT W/O FL MOD SED  12/28/2017   IR US GUIDE VASC ACCESS LEFT  12/28/2017   left knee surgery  07/18/1998   torn meniscus   right flank surgery  07/19/2003   bnign mass from flank area-right   ROBOTIC ASSISTED TOTAL HYSTERECTOMY WITH BILATERAL SALPINGO OOPHERECTOMY Bilateral 09/25/2012   Procedure: ROBOTIC ASSISTED TOTAL HYSTERECTOMY WITH BILATERAL SALPINGO OOPHORECTOMY;  Surgeon: Dorien Chihuahua. Richardson Dopp, MD;  Location: [facility];  Service: Gynecology;  Laterality: Bilateral;   ROTATOR CUFF REPAIR     WISDOM TOOTH EXTRACTION     Patient Active Problem List   Diagnosis Date Noted   Skin rash 04/29/2022   Left foot pain 02/04/2020   Dysuria 08/03/2018   Elevated BP without diagnosis of hypertension 08/03/2018   Chronic leukopenia 08/03/2018   Acute pain 04/18/2018   Polypharmacy 11/14/2017   Easy bruising 11/14/2017   Sinus infection 11/14/2017   Pulmonary nodules 08/18/2017   Vitamin D deficiency 07/19/2017  Vitamin B12 deficiency 07/19/2017   Cough, persistent 06/20/2017   Orthostatic dizziness 01/24/2017   Addison disease (HCC) 01/16/2017   Anxiety 01/16/2017   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   Goals of care, counseling/discussion 11/20/2016   Pancytopenia, acquired (HCC) 10/10/2016   Deficiency anemia 10/10/2016   Upper airway cough syndrome 01/28/2016   Cough variant asthma 01/28/2016   Dyspnea 01/28/2016   Neuropathy due to chemotherapeutic drug (HCC) 10/12/2015   Constipation, acute 10/12/2015   Cognitive impairment 08/13/2015   Dysphagia, idiopathic 04/30/2015   Low serum cortisol level  10/20/2014   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, not intractable, without status epilepticus (HCC) 10/17/2014   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   Chronic fatigue 10/03/2014   Hot flashes 10/03/2014   Liver lesion 08/22/2014   Pancytopenia due to antineoplastic chemotherapy (HCC) 06/23/2014   Genetic testing 06/13/2014   Intractable chronic cluster headache 04/28/2014   Thrombocytopenia (HCC) 04/28/2014   Neuropathic pain 04/08/2014   Simple partial seizures (HCC) 03/20/2014   Sensory seizure (HCC) 03/20/2014   Dysphasia 02/12/2014   Headache 02/12/2014   Seizures (HCC) 02/12/2014   Financial difficulty 02/12/2014   CIN I (cervical intraepithelial neoplasia I) 02/12/2014   Anemia, unspecified 02/12/2014   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  Rationale for Evaluation and Treatment: {HABREHAB:27488}  SUBJECTIVE:   SUBJECTIVE STATEMENT: *** Pt accompanied by: {accompnied:27141}  PERTINENT HISTORY: She cannot recall the last time she had a seizure. Her biggest issues are her speech and tremor. She notes swallowing is a real problem, everything small gets stuck. She avoids rice and corn, and would take a bite of something solid to push food down. She denies any weight loss. She notes that speech changes are more of word-finding, but also it feels like the right side of her face wants to move really fast, but the left side can't catch up. She notes tremors in her hand and right side of face.   PAIN:  Are you  having pain? {OPRCPAIN:27236}  FALLS: Has patient fallen in last 6 months?  {ZOXWRUEA:54098}  LIVING ENVIRONMENT: Lives with: {OPRC lives with:25569::"lives with their family"} Lives in: {Lives in:25570}  PLOF:  Level of assistance: {JXBJYNW:29562} Employment: {SLPemployment:25674}  PATIENT GOALS: ***  OBJECTIVE:   DIAGNOSTIC FINDINGS: ***  COGNITION: Overall cognitive status: {cognition:24006} Areas of impairment:  {cognitiveimpairmentslp:27409} Functional deficits: ***  AUDITORY COMPREHENSION: Overall auditory comprehension: {IMPAIRED:25374} YES/NO questions: {IMPAIRED:25374} Following directions: {IMPAIRED:25374} Conversation: {SLP conversation:25430} Interfering components: {SLP interfering components:25431} Effective technique: {SLP effective technique:25432}  READING COMPREHENSION: {SLPreadingcomprehension:27140}  EXPRESSION: {SLP EXPRESSION:25433}  VERBAL EXPRESSION: Level of generative/spontaneous verbalization: {SLP level of generative/spontaneious verbalization:25435} Automatic speech: {SLP ATOMIC SPEECH:25434}  Repetition: {SLPrepetion:27212} Naming: {SLPnaming:27214} Pragmatics: {slppragmatics:27216} Comments: *** Interfering components: {SLP INTERFERING COMPONENTS:25436} Effective technique: {SLP EFFECTIVE TECHNIQUE:25437} Non-verbal means of communication: {SLP non verbal means of communication:25438}  WRITTEN EXPRESSION: Dominant hand: {RIGHT/LEFT:20294} Written expression: {slpwrittenexp:27209}  MOTOR SPEECH: Overall motor speech: {slpimpaired:27210} Level of impairment: {SLP level of impairment:25441} Respiration: {respbreathing:27195} Phonation: {SLP phonation:25439} Resonance: {SLP resonance:25440} Articulation: {SLParticulation:27218} Intelligibility: {SLP Intelligible:25442} Motor planning: {slpmotorspeecherrors:27220} Motor speech  errors: {SLP motor speech errors:25443} Interfering components: {SLP Interfering components  (MS):25444} Effective technique: {SLP effective technique (MS):25445}  ORAL MOTOR EXAMINATION: Overall status: {OMESLP2:27645} Comments: ***  RECOMMENDATIONS FROM OBJECTIVE SWALLOW STUDY (MBSS/FEES):  ***--Scheduled 01/17/23  Objective swallow impairments: *** Objective recommended compensations: ***  CLINICAL SWALLOW ASSESSMENT:   Current diet: {slpdiet:27196} Dentition: {dentition:27197} Patient directly observed with POs: {POobserved:27199} Feeding: {slp feeding:27200} Liquids provided by: {SLPliquids:27201} Oral phase signs and symptoms: {SLPoralphase:27202} Pharyngeal phase signs and symptoms: {SLPpharyngealphase:27203} Comments: ***  STANDARDIZED ASSESSMENTS: {SLPstandardizedassessment:27092}  PATIENT REPORTED OUTCOME MEASURES (PROM): {SLPPROM:27095}   TODAY'S TREATMENT:                                                                                                                                         DATE:   01/13/23: ***    PATIENT EDUCATION: Education details: see above Person educated: Patient Education method: Explanation Education comprehension: verbalized understanding   GOALS: Goals reviewed with patient? {yes/no:20286}  SHORT TERM GOALS: Target date: ***  *** Baseline: Goal status: INITIAL  2.  *** Baseline:  Goal status: INITIAL  3.  *** Baseline:  Goal status: INITIAL  4.  *** Baseline:  Goal status: INITIAL  5.  *** Baseline:  Goal status: INITIAL  6.  *** Baseline:  Goal status: INITIAL  LONG TERM GOALS: Target date: ***  *** Baseline:  Goal status: INITIAL  2.  *** Baseline:  Goal status: INITIAL  3.  *** Baseline:  Goal status: INITIAL  4.  *** Baseline:  Goal status: INITIAL  5.  *** Baseline:  Goal status: INITIAL  6.  *** Baseline:  Goal status: INITIAL  ASSESSMENT:  CLINICAL IMPRESSION: Patient is a 42 y.o. F who was seen today for {SLP eval types:29206} s/p ***. Evaluation reveals  {MILD/MOD/MARKED/SEVERE:28894} {SLPOBJIMP:27107}. Pt's {ST domains:29166} is c/b ***. Pt reports ***. Pt would benefit from skilled ST to address aforementioned deficits to {CI outcomes:29167}.  .   OBJECTIVE IMPAIRMENTS: include {SLPOBJIMP:27107}. These impairments are limiting patient from {SLPLIMIT:27108}. Factors affecting potential to achieve goals and functional outcome are {SLP factors:25450}. Patient will benefit from skilled SLP services to address above impairments and improve overall function.  REHAB POTENTIAL: {rehabpotential:25112}  PLAN:  SLP FREQUENCY: {rehab frequency:25116}  SLP DURATION: {rehab duration:25117}  PLANNED INTERVENTIONS: {SLP treatment/interventions:25449}    [place], Student-SLP 01/13/2023, 8:14 AM

## 2023-01-17 ENCOUNTER — Ambulatory Visit (HOSPITAL_COMMUNITY)
Admission: RE | Admit: 2023-01-17 | Discharge: 2023-01-17 | Disposition: A | Discharge status: Home / Self Care | Payer: Medicare Other | Source: Ambulatory Visit | Attending: Internal Medicine | Admitting: Internal Medicine

## 2023-01-17 ENCOUNTER — Other Ambulatory Visit: Payer: Self-pay | Admitting: Hematology and Oncology

## 2023-01-17 ENCOUNTER — Other Ambulatory Visit (HOSPITAL_COMMUNITY): Payer: Self-pay

## 2023-01-17 DIAGNOSIS — R251 Tremor, unspecified: Secondary | ICD-10-CM | POA: Diagnosis not present

## 2023-01-17 DIAGNOSIS — K219 Gastro-esophageal reflux disease without esophagitis: Secondary | ICD-10-CM | POA: Diagnosis not present

## 2023-01-17 DIAGNOSIS — C719 Malignant neoplasm of brain, unspecified: Secondary | ICD-10-CM

## 2023-01-17 DIAGNOSIS — K117 Disturbances of salivary secretion: Secondary | ICD-10-CM | POA: Insufficient documentation

## 2023-01-17 DIAGNOSIS — R1310 Dysphagia, unspecified: Secondary | ICD-10-CM | POA: Diagnosis not present

## 2023-01-17 DIAGNOSIS — R059 Cough, unspecified: Secondary | ICD-10-CM

## 2023-01-17 DIAGNOSIS — R4789 Other speech disturbances: Secondary | ICD-10-CM

## 2023-01-17 MED ORDER — HYDROCODONE-ACETAMINOPHEN 10-325 MG PO TABS
1.0000 | ORAL_TABLET | Freq: Three times a day (TID) | ORAL | 0 refills | Status: DC | PRN
Start: 2023-01-17 — End: 2023-02-10
  Filled 2023-01-17: qty 90, 30d supply, fill #0

## 2023-01-17 MED ORDER — CYCLOBENZAPRINE HCL 10 MG PO TABS
10.0000 mg | ORAL_TABLET | Freq: Three times a day (TID) | ORAL | 1 refills | Status: DC | PRN
  Filled 2023-01-17: qty 30, 10d supply, fill #0
  Filled 2023-01-24 (×2): qty 30, 10d supply, fill #1

## 2023-01-24 ENCOUNTER — Other Ambulatory Visit (HOSPITAL_COMMUNITY): Payer: Self-pay

## 2023-01-24 DIAGNOSIS — C719 Malignant neoplasm of brain, unspecified: Secondary | ICD-10-CM | POA: Diagnosis not present

## 2023-01-24 MED ORDER — PROMETHAZINE HCL 12.5 MG PO TABS
12.5000 mg | ORAL_TABLET | Freq: Three times a day (TID) | ORAL | 0 refills | Status: DC | PRN
  Filled 2023-01-24: qty 30, 10d supply, fill #0

## 2023-01-24 MED ORDER — LOPERAMIDE HCL 2 MG PO CAPS
2.0000 mg | ORAL_CAPSULE | Freq: Four times a day (QID) | ORAL | 0 refills | Status: DC | PRN
  Filled 2023-01-24: qty 30, 8d supply, fill #0

## 2023-01-25 DIAGNOSIS — C711 Malignant neoplasm of frontal lobe: Secondary | ICD-10-CM | POA: Diagnosis not present

## 2023-01-26 ENCOUNTER — Other Ambulatory Visit: Payer: Self-pay

## 2023-01-26 ENCOUNTER — Other Ambulatory Visit (HOSPITAL_COMMUNITY): Payer: Self-pay

## 2023-01-30 ENCOUNTER — Encounter: Payer: Self-pay | Admitting: Neurology

## 2023-01-30 ENCOUNTER — Other Ambulatory Visit: Payer: Self-pay | Admitting: Hematology and Oncology

## 2023-01-30 ENCOUNTER — Encounter: Payer: Self-pay | Admitting: Hematology and Oncology

## 2023-01-31 ENCOUNTER — Other Ambulatory Visit (HOSPITAL_COMMUNITY): Payer: Self-pay

## 2023-01-31 ENCOUNTER — Other Ambulatory Visit: Payer: Self-pay

## 2023-01-31 DIAGNOSIS — C719 Malignant neoplasm of brain, unspecified: Secondary | ICD-10-CM | POA: Diagnosis not present

## 2023-01-31 DIAGNOSIS — K224 Dyskinesia of esophagus: Secondary | ICD-10-CM

## 2023-01-31 MED ORDER — BUTALBITAL-APAP-CAFFEINE 50-325-40 MG PO TABS
1.0000 | ORAL_TABLET | Freq: Two times a day (BID) | ORAL | 0 refills | Status: DC | PRN
  Filled 2023-01-31 – 2023-02-06 (×2): qty 60, 30d supply, fill #0

## 2023-02-01 ENCOUNTER — Encounter (HOSPITAL_COMMUNITY): Payer: Self-pay

## 2023-02-01 ENCOUNTER — Other Ambulatory Visit (HOSPITAL_COMMUNITY): Payer: Self-pay

## 2023-02-01 MED ORDER — CYCLOBENZAPRINE HCL 10 MG PO TABS
10.0000 mg | ORAL_TABLET | Freq: Three times a day (TID) | ORAL | 1 refills | Status: DC | PRN
  Filled 2023-02-01: qty 30, 10d supply, fill #0
  Filled 2023-02-10 (×2): qty 30, 10d supply, fill #1

## 2023-02-01 NOTE — Therapy (Signed)
OUTPATIENT SPEECH LANGUAGE PATHOLOGY EVALUATION   Patient Name: Victoria Bates MRN: [id number] DOB:[birth year], 42 y.o., female Today's Date: 02/02/2023  PCP: Collene Mares, [place] - General  REFERRING PROVIDER: Van Clines, [facility] Provider   END OF SESSION:  End of Session - 02/02/23 1009     Visit Number 1    Number of Visits 1    SLP Start Time 0845    SLP Stop Time  0930    SLP Time Calculation (min) 45 min    Activity Tolerance Patient tolerated treatment well             Past Medical History:  Diagnosis Date   Adrenal tumor    Anemia    Anxiety    Asthma    very mild- no inhaler use since July 2013   Brain tumor [facility])    Hx: of   Cancer (HCC) 2003   skin   Chronic fatigue 10/03/2014   Cognitive impairment 08/13/2015   Complication of anesthesia    takes longer for patient to go under anesthesia   Dizziness    Dysphasia 02/12/2014   Glioma, malignant (HCC) dx'd 09/2013   Headache 02/12/2014   Headache(784.0)    migraines   Herniated disc    Hot flashes 10/03/2014   Infection    to right head incision   Lactation disorder 10/03/2014   Liver hemangioma    Liver lesion 08/22/2014   Nausea alone 02/12/2014   Neuropathic pain 04/08/2014   Petit mal seizure status (HCC)    Seizures (HCC)    petit mal serizures most days   Past Surgical History:  Procedure Laterality Date   ABDOMINAL HYSTERECTOMY     CESAREAN SECTION     COLONOSCOPY WITH PROPOFOL N/A 08/29/2012   Procedure: COLONOSCOPY WITH PROPOFOL;  Surgeon: Willis Modena, MD;  [facility];  Service: Endoscopy;  Laterality: N/A;   coposcopy  08/06/2012   cervical biopsy   CRANIOTOMY Right 09/26/2013   Procedure: Awake Right frontal open brain biopsy;  Surgeon: [name] Hurt, MD;  [facility];  Service: Neurosurgery;  Laterality: Right;  awake Right frontal open brain biopsy   CRANIOTOMY N/A 10/30/2013   Procedure: debridement of cranial wound  right temporal incision, ;   Surgeon: [name] Hurt, MD;  Location: [facility];  Service: Neurosurgery;  Laterality: N/A;   CRANIOTOMY N/A 01/23/2014   Procedure: Awake Craniotomy with BrainLab;  Surgeon: [name] Hurt, MD;  Location: [facility];  Service: Neurosurgery;  Laterality: N/A;  Awake Craniotomy  tumor with BrainLab   ESOPHAGOGASTRODUODENOSCOPY (EGD) WITH PROPOFOL N/A 08/29/2012   Procedure: ESOPHAGOGASTRODUODENOSCOPY (EGD) WITH PROPOFOL;  Surgeon: Willis Modena, MD;  [facility];  Service: Endoscopy;  Laterality: N/A;   EYE SURGERY     6 surgeries   IR RADIOLOGY PERIPHERAL GUIDED IV START  12/28/2017   IR REMOVAL TUN ACCESS W/ PORT W/O FL MOD SED  12/28/2017   IR US GUIDE VASC ACCESS LEFT  12/28/2017   left knee surgery  07/18/1998   torn meniscus   right flank surgery  07/19/2003   bnign mass from flank area-right   ROBOTIC ASSISTED TOTAL HYSTERECTOMY WITH BILATERAL SALPINGO OOPHERECTOMY Bilateral 09/25/2012   Procedure: ROBOTIC ASSISTED TOTAL HYSTERECTOMY WITH BILATERAL SALPINGO OOPHORECTOMY;  Surgeon: Dorien Chihuahua. Richardson Dopp, MD;  Location: [facility];  Service: Gynecology;  Laterality: Bilateral;   ROTATOR CUFF REPAIR     WISDOM TOOTH EXTRACTION     Patient Active Problem List  Diagnosis Date Noted   Skin rash 04/29/2022   Left foot pain 02/04/2020   Dysuria 08/03/2018   Elevated BP without diagnosis of hypertension 08/03/2018   Chronic leukopenia 08/03/2018   Acute pain 04/18/2018   Polypharmacy 11/14/2017   Easy bruising 11/14/2017   Sinus infection 11/14/2017   Pulmonary nodules 08/18/2017   Vitamin D deficiency 07/19/2017   Vitamin B12 deficiency 07/19/2017   Cough, persistent 06/20/2017   Orthostatic dizziness 01/24/2017   Addison disease (HCC) 01/16/2017   Anxiety 01/16/2017   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   Goals of care, counseling/discussion 11/20/2016   Pancytopenia, acquired (HCC) 10/10/2016   Deficiency anemia 10/10/2016   Upper airway cough syndrome 01/28/2016   Cough  variant asthma 01/28/2016   Dyspnea 01/28/2016   Neuropathy due to chemotherapeutic drug (HCC) 10/12/2015   Constipation, acute 10/12/2015   Cognitive impairment 08/13/2015   Dysphagia, idiopathic 04/30/2015   Low serum cortisol level 10/20/2014   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, not intractable, without status epilepticus (HCC) 10/17/2014   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   Chronic fatigue 10/03/2014   Hot flashes 10/03/2014   Liver lesion 08/22/2014   Pancytopenia due to antineoplastic chemotherapy (HCC) 06/23/2014   Genetic testing 06/13/2014   Intractable chronic cluster headache 04/28/2014   Thrombocytopenia (HCC) 04/28/2014   Neuropathic pain 04/08/2014   Simple partial seizures (HCC) 03/20/2014   Sensory seizure (HCC) 03/20/2014   Dysphasia 02/12/2014   Headache 02/12/2014   Seizures (HCC) 02/12/2014   Financial difficulty 02/12/2014   CIN I (cervical intraepithelial neoplasia I) 02/12/2014   Anemia, unspecified 02/12/2014   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 09/26/2013   Paresthesias 09/20/2013   Blurred vision, left eye 09/20/2013   Asthma 07/24/2009   CHEST PAIN 07/24/2009    ONSET DATE:  12/27/2022    REFERRING DIAG:  R13.10 (ICD-10-CM) - Dysphagia, unspecified type  R47.89 (ICD-10-CM) - Word finding difficulty  C71.9 (ICD-10-CM) - Brain tumor, astrocytoma (HCC)    THERAPY DIAG:  Dysphagia, unspecified type  Cognitive communication deficit  Rationale for Evaluation and Treatment: Rehabilitation  SUBJECTIVE:   SUBJECTIVE STATEMENT: "If I am honest, I don't know what I have a referral."  Pt accompanied by: self  PERTINENT HISTORY: Reports issues with tremors, speech and swallowing with symptoms of food getting stuck. Denies weight loss. She avoids rice and corn, and would take a bite of something solid to push food down.  She notes that speech changes are more of word-finding, but also it feels like the right side of her face wants to move really fast, but the left side can't catch up. She notes tremors in her hand and right side of face.   PAIN:  Are you having pain? No  FALLS: Has patient fallen in last 6 months?  No  LIVING ENVIRONMENT: Lives with: lives with their spouse and lives with their son Lives in: House/apartment  PLOF:  Level of assistance: Independent with ADLs, Independent with IADLs Employment: Full-time employment-Construction business.   PATIENT GOALS: "get brain back from chemo"  OBJECTIVE:   COGNITION: Overall cognitive status: Within functional limits for tasks assessed Areas of impairment:  Attention: Impaired: Comment: Difficulty with multi-tasking Memory: Impaired: Word finding, to complete tasks, information from or during conversations  Comments: Patient endorses word finding difficulty and attention with multi-tasking, and states compensations she has put in place. Demonstrating strong executive functioning resulting in problem solving to  accommodate in deviances from baseline.    AUDITORY COMPREHENSION: Overall auditory comprehension: Appears intact YES/NO questions: Appears intact Following directions: Appears intact Conversation: Simple, Complex, and Moderately Complex  READING COMPREHENSION: Intact  EXPRESSION: verbal  VERBAL EXPRESSION: Level of generative/spontaneous verbalization: word, phrase, sentence, and conversation Repetition: Appears intact Pragmatics: Appears intact Comments: Patient comments on word finding issues and compensates through describing the word and drawing it out.  Interfering components: attention Effective technique: semantic cues and phonemic cues Non-verbal means of communication: N/A  MOTOR SPEECH: Overall motor speech: Appears intact Level of impairment: Word, Phrase, Sentence, and Conversation Respiration:  diaphragmatic/abdominal breathing Phonation: hoarse Resonance: WFL Articulation: Appears intact Intelligibility: Intelligible Motor planning: Appears intact Motor speech errors: aware  RECOMMENDATIONS FROM OBJECTIVE SWALLOW STUDY (MBSS/FEES):  Normal oropharyngeal swallow ability with adequate timing and strength of oropharyngeal swallow. Swallow initiation was at pyriform sinuses with liquids and at valleculae with solids. Pt was able to swallow 13 mm barium tablet with thin liquids that easily transited into esophagus. With A-P view requiring slight chin elevation, pt reported difficulty swallowing puree - however she easily cleared. 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 Pt observed to frequently clear her throat before at baseline, while swallowing water and after testing but not during actual MBS. Upon esophageal sweep, she appeared with retention. SLP questions if her dysphagia symptoms could be secondary to esophageal source and recommend consider dedicated esophageal evaluation given her ongoing symptoms.  Objective swallow impairments: Esophageal retention Objective recommended compensations: Slow rate, small bites/sips  CLINICAL SWALLOW ASSESSMENT:   Current diet: regular Dentition: adequate natural dentition Patient directly observed with POs: No Feeding: able to feed self Oral phase signs and symptoms:  None noted in MBSS report Pharyngeal phase signs and symptoms:  None noted in  MBSS report  Comments: Reccommended strategies slow rate, small bites/sips, position pt fully upright for meals   STANDARDIZED ASSESSMENTS: None d/t nature of impairments   PATIENT REPORTED OUTCOME MEASURES (PROM): EAT-10: Rated 1: Swallowing has caused me to lose weight, swallowing liquids takes extra effort. Rated 2: Swallowing solids takes extra effort, the pleasure  of eating is affected by swallowing. Rated 3: Swallowing interferes with my ability to go out for meals, I cough when I eat, and swallowing is stressful. Rated 4: food sticks in my throat, and swallowing pills takes extra effort. Overall rated 23   TODAY'S TREATMENT:                                                                                                                                         DATE:   02/02/23: Patient presents with hoarse voice and states that this is due to dry mouth due to the medication she is on and throat irritations 2/2 chemotherapy. Complaints on pills,  and education provided on mixing pills with heavy bolus such as applesauce, and hard swallow. Patient states difficulty with word finding, but states that she describes the words and that familiar listeners are able to understand her. Patient demonstrates intact executive functioning by putting compensations in place such as hard swallows, mixing items with food, taking time with problem foods such as rice and corn. Education and demonstrated provided on medication to aid in dry mouth. Hoarse voice targeted through SOVTE, patient able to demonstrated understanding by demonstrating practice. Provided education on attention/memory strategies to maximize successful participation in desired activities.   PATIENT EDUCATION: Education details: See above Person educated: Patient Education method: Medical illustrator Education comprehension: verbalized understanding and returned demonstration   ASSESSMENT:  CLINICAL IMPRESSION: Patient is a 42 y.o. F who was seen today for cognitive linguistic, dysphagia, and voice evaluation 2/2 brain tumor. Evaluation reveals mild impairments in attention and memory, voice disorder, and dysphagia. Pt reports struggles with word finding, difficulties with swallowing pills, and hoarse voice. Reports challenges with attention and memory. Pt has decreased work responsibilities and is  making errors in performance of home based tasks (e.g. leaving stove on). Struggles especially with managing distractions and is less successful with multitasking. Despite change in baseline, pt demonstrates strong executive functioning and has implemented strategies and compensations to accommodate for deficits. Education was provided regarding strategies to further support success in desired or required activities. Swallow function demonstrated WFL via MBSS, education provided regarding strategies/compensations to support pt reported challenges with smaller bolus sizes (rice, corn) and pills. Instructed on semi-occluded vocal tract exercises to support hoarse voice in case pt is overcompensating for decreased voice quality 2/2 chemo. Pt declines ST at this time, is satisfied with current ability to participate using in place strategies and compensations.   OBJECTIVE IMPAIRMENTS: include attention, memory, voice disorder, and dysphagia. These impairments are limiting patient from effectively communicating at home and in community and safety when swallowing. Factors affecting potential to achieve goals and functional outcome are co-morbidities. Skilled ST is not indicated at this time.   REHAB POTENTIAL: Excellent  PLAN:  SLP FREQUENCY: one time visit   [facility], Student-SLP 02/02/2023, 10:26 AM

## 2023-02-02 ENCOUNTER — Ambulatory Visit: Payer: Medicare Other | Attending: Neurology | Admitting: Speech Pathology

## 2023-02-02 ENCOUNTER — Encounter: Payer: Self-pay | Admitting: Speech Pathology

## 2023-02-02 ENCOUNTER — Other Ambulatory Visit: Payer: Self-pay

## 2023-02-02 DIAGNOSIS — R41841 Cognitive communication deficit: Secondary | ICD-10-CM | POA: Diagnosis not present

## 2023-02-02 DIAGNOSIS — R4789 Other speech disturbances: Secondary | ICD-10-CM | POA: Diagnosis not present

## 2023-02-02 DIAGNOSIS — C719 Malignant neoplasm of brain, unspecified: Secondary | ICD-10-CM | POA: Diagnosis not present

## 2023-02-02 DIAGNOSIS — R1310 Dysphagia, unspecified: Secondary | ICD-10-CM | POA: Diagnosis not present

## 2023-02-02 NOTE — Patient Instructions (Addendum)
The frontal lobe is the largest part of the brain, located behind the forehead and making up about one-third of the cerebrum. It's responsible for many functions, including: Executive functions: Planning, organizing, initiating, self-monitoring, and controlling responses to achieve a goal Cognitive skills: Attention, reasoning, judgment, problem solving, creativity, emotional regulation, impulse control, and awareness Social skills: Understanding and controlling how you talk, behave, and interact with others Motor function: Voluntary movement Memory: Working [employer] fast streamer and the ability to store and use information  Right frontal brain is involved in the overview part of communication: making sense of context, organizing thoughts and ideas, responding appropriately based on social environment   Strategies for Improving Your Attention and Memory  Use good eye-contact Give the speaker your undivided attention Look directly at the speaker  Complete one task at a time Avoid multitasking Complete one task before starting a new one Write a note to yourself if you think of something else that needs to be done Let others know when you need quiet time and can't be interrupted Don't answer the phone, texts, or emails while you are working on another task  Put aside distracting thoughts If you find your mind wandering, refocus your attention on the speaker Avoid off-topic comments or responses that may divert your attention  If something important comes to mind, let the speaker know and pause to write yourself a note: "Do you mind holding on a minute, I have to write something down." Put thoughts on hold and focus on salient information  Limit distractions in your environment Think about the environment around you Limit background noise by turning off the TV or music, putting your phone away Close the door and work in quiet  Use active listening Actively participate in the conversation to stay  focused Paraphrase what you have heard to include the most important details Adding some associations may help you remember Ask questions to clarify certain points Summarize the speaker's comments periodically Avoid nodding your head and using "mhm" responses as these are more passive and don't help your attention  Alert the other person/people It may be helpful to alert your listener to the fact that you may need reminders to keep on track Tell the speaker in advance that you may need to stop them and have them repeat salient information If you lose focus, interject and let the person know, "I'm sorry, I lost you, can you tell me again?"  Write down information Write down pertinent information as it comes up, such as telephone numbers, names of people, addresses, details from appointments and conversations, etc.  LIMIT MULTITASKING!! Especially when it's something important.   Picking what is important:  Elephants tasks that move you towards a goal require higher level thinking skills can be done in 45 minute or less  Rabbits busy work don't require higher level thinking skills  Write down everything you want to get done. Decide which tasks are elephants and which tasks are rabbits:             Pick 2 elephants to work on per day!   When working on your elephant tasks-- FOCUS on that task only!! NO MULTITASKING!   Distractions are everywhere, that is life. Try to limit as you're able.   What are your top distractions? How can you reduce those distractions?      2.    3.    4.    5.    Retrain your brain to focus on 1 important thing at a  time by starting small. Pick something important, put aside distractions, and focus your attention for a set period. Use a timer to keep you on track. When the timer goes off, you can focus attention elsewhere if needed.    Swallow Strategies  Take pills with thicker textures (applesauce or pudding)      Semi-occluded vocal tract exercises (SOVTE)  These allow your vocal folds to vibrate without excess tension and promotes high placement of the voice  Use SOVTE as a warm up before prolonged speaking and vocal exercises   High resistance: voicing through a stirring straw  Medium resistance: voicing through a drinking straw  Less resistance: Voiced /v/                            Lip or Tongue Trill                            Nasal "hums" /m/ and /n/                            Vowels /u/ and ee  Watch Vocal Straw Exercises with Lenox Ahr on YouTube:  [email or web address]  Pitch Glides for 2 minutes  Accents (siren)  Hum [employer]  A goal would be 2-3 minutes several times a day and prior to vocal exercises  As always, use good belly breathing while completing SOVTE

## 2023-02-06 ENCOUNTER — Other Ambulatory Visit: Payer: Self-pay | Admitting: Hematology and Oncology

## 2023-02-06 ENCOUNTER — Other Ambulatory Visit: Payer: Self-pay

## 2023-02-06 ENCOUNTER — Other Ambulatory Visit (HOSPITAL_COMMUNITY): Payer: Self-pay

## 2023-02-06 MED ORDER — LORAZEPAM 0.5 MG PO TABS
0.5000 mg | ORAL_TABLET | Freq: Every day | ORAL | 0 refills | Status: DC | PRN
  Filled 2023-02-06: qty 30, 30d supply, fill #0

## 2023-02-07 ENCOUNTER — Other Ambulatory Visit: Payer: Self-pay

## 2023-02-10 ENCOUNTER — Other Ambulatory Visit: Payer: Self-pay | Admitting: Hematology and Oncology

## 2023-02-10 ENCOUNTER — Other Ambulatory Visit (HOSPITAL_COMMUNITY): Payer: Self-pay

## 2023-02-10 ENCOUNTER — Other Ambulatory Visit: Payer: Self-pay

## 2023-02-10 DIAGNOSIS — C719 Malignant neoplasm of brain, unspecified: Secondary | ICD-10-CM

## 2023-02-10 MED ORDER — EPINEPHRINE 0.3 MG/0.3ML IJ SOAJ
INTRAMUSCULAR | 1 refills | Status: DC
  Filled 2023-02-10: qty 2, 1d supply, fill #0
  Filled 2023-02-14: qty 2, 1d supply, fill #1

## 2023-02-10 MED ORDER — PROMETHAZINE HCL 12.5 MG PO TABS
12.5000 mg | ORAL_TABLET | Freq: Three times a day (TID) | ORAL | 0 refills | Status: DC | PRN
  Filled 2023-02-10: qty 30, 10d supply, fill #0

## 2023-02-13 ENCOUNTER — Encounter: Payer: Self-pay | Admitting: Hematology and Oncology

## 2023-02-13 ENCOUNTER — Other Ambulatory Visit (HOSPITAL_COMMUNITY): Payer: Self-pay

## 2023-02-14 ENCOUNTER — Other Ambulatory Visit (HOSPITAL_COMMUNITY): Payer: Self-pay

## 2023-02-14 MED ORDER — HYDROCODONE-ACETAMINOPHEN 10-325 MG PO TABS
1.0000 | ORAL_TABLET | Freq: Three times a day (TID) | ORAL | 0 refills | Status: DC | PRN
Start: 2023-02-14 — End: 2023-03-13
  Filled 2023-02-14: qty 90, 30d supply, fill #0

## 2023-02-19 ENCOUNTER — Other Ambulatory Visit (HOSPITAL_COMMUNITY): Payer: Self-pay

## 2023-02-20 ENCOUNTER — Other Ambulatory Visit (HOSPITAL_COMMUNITY): Payer: Self-pay

## 2023-02-20 MED ORDER — CYCLOBENZAPRINE HCL 10 MG PO TABS
10.0000 mg | ORAL_TABLET | Freq: Three times a day (TID) | ORAL | 1 refills | Status: DC | PRN
  Filled 2023-02-20 (×2): qty 30, 10d supply, fill #0
  Filled 2023-02-28: qty 30, 10d supply, fill #1

## 2023-02-20 MED ORDER — ALBUTEROL SULFATE HFA 108 (90 BASE) MCG/ACT IN AERS
INHALATION_SPRAY | RESPIRATORY_TRACT | 1 refills | Status: DC
  Filled 2023-02-20: qty 6.7, 15d supply, fill #0
  Filled 2023-03-16: qty 6.7, 15d supply, fill #1

## 2023-02-20 MED ORDER — PROPRANOLOL HCL 20 MG PO TABS
20.0000 mg | ORAL_TABLET | ORAL | 1 refills | Status: DC
  Filled 2023-02-20 – 2023-03-13 (×2): qty 180, 90d supply, fill #0
  Filled 2023-06-06 – 2023-06-19 (×3): qty 180, 90d supply, fill #1

## 2023-02-21 ENCOUNTER — Other Ambulatory Visit: Payer: Self-pay

## 2023-02-22 DIAGNOSIS — C719 Malignant neoplasm of brain, unspecified: Secondary | ICD-10-CM | POA: Diagnosis not present

## 2023-02-28 ENCOUNTER — Other Ambulatory Visit (HOSPITAL_COMMUNITY): Payer: Self-pay

## 2023-03-01 DIAGNOSIS — M79641 Pain in right hand: Secondary | ICD-10-CM | POA: Diagnosis not present

## 2023-03-01 DIAGNOSIS — M25562 Pain in left knee: Secondary | ICD-10-CM | POA: Diagnosis not present

## 2023-03-02 DIAGNOSIS — M25462 Effusion, left knee: Secondary | ICD-10-CM | POA: Diagnosis not present

## 2023-03-02 DIAGNOSIS — M25562 Pain in left knee: Secondary | ICD-10-CM | POA: Diagnosis not present

## 2023-03-03 ENCOUNTER — Other Ambulatory Visit: Payer: Self-pay | Admitting: Hematology and Oncology

## 2023-03-03 ENCOUNTER — Other Ambulatory Visit (HOSPITAL_COMMUNITY): Payer: Self-pay

## 2023-03-03 DIAGNOSIS — M25562 Pain in left knee: Secondary | ICD-10-CM | POA: Diagnosis not present

## 2023-03-03 DIAGNOSIS — C711 Malignant neoplasm of frontal lobe: Secondary | ICD-10-CM | POA: Diagnosis not present

## 2023-03-03 MED ORDER — BUTALBITAL-APAP-CAFFEINE 50-325-40 MG PO TABS
1.0000 | ORAL_TABLET | Freq: Two times a day (BID) | ORAL | 0 refills | Status: DC | PRN
  Filled 2023-03-03 – 2023-03-07 (×2): qty 60, 30d supply, fill #0

## 2023-03-07 ENCOUNTER — Other Ambulatory Visit: Payer: Self-pay

## 2023-03-07 ENCOUNTER — Other Ambulatory Visit (HOSPITAL_COMMUNITY): Payer: Self-pay

## 2023-03-09 ENCOUNTER — Other Ambulatory Visit (HOSPITAL_COMMUNITY): Payer: Self-pay

## 2023-03-09 MED ORDER — CYCLOBENZAPRINE HCL 10 MG PO TABS
10.0000 mg | ORAL_TABLET | Freq: Three times a day (TID) | ORAL | 1 refills | Status: DC | PRN
  Filled 2023-03-09: qty 30, 10d supply, fill #0
  Filled 2023-03-16: qty 30, 10d supply, fill #1

## 2023-03-10 ENCOUNTER — Other Ambulatory Visit (HOSPITAL_COMMUNITY): Payer: Self-pay

## 2023-03-13 ENCOUNTER — Other Ambulatory Visit (HOSPITAL_COMMUNITY): Payer: Self-pay

## 2023-03-13 ENCOUNTER — Other Ambulatory Visit: Payer: Self-pay | Admitting: Hematology and Oncology

## 2023-03-13 DIAGNOSIS — C719 Malignant neoplasm of brain, unspecified: Secondary | ICD-10-CM

## 2023-03-14 ENCOUNTER — Other Ambulatory Visit (HOSPITAL_COMMUNITY): Payer: Self-pay

## 2023-03-14 ENCOUNTER — Other Ambulatory Visit: Payer: Self-pay

## 2023-03-14 DIAGNOSIS — R5383 Other fatigue: Secondary | ICD-10-CM | POA: Diagnosis not present

## 2023-03-14 DIAGNOSIS — K30 Functional dyspepsia: Secondary | ICD-10-CM | POA: Diagnosis not present

## 2023-03-14 DIAGNOSIS — D3502 Benign neoplasm of left adrenal gland: Secondary | ICD-10-CM | POA: Diagnosis not present

## 2023-03-14 DIAGNOSIS — Z92241 Personal history of systemic steroid therapy: Secondary | ICD-10-CM | POA: Diagnosis not present

## 2023-03-14 MED ORDER — HYDROCODONE-ACETAMINOPHEN 10-325 MG PO TABS
1.0000 | ORAL_TABLET | Freq: Three times a day (TID) | ORAL | 0 refills | Status: DC | PRN
Start: 2023-03-14 — End: 2023-04-11
  Filled 2023-03-14: qty 90, 30d supply, fill #0

## 2023-03-17 DIAGNOSIS — M25462 Effusion, left knee: Secondary | ICD-10-CM | POA: Diagnosis not present

## 2023-03-17 DIAGNOSIS — M25562 Pain in left knee: Secondary | ICD-10-CM | POA: Diagnosis not present

## 2023-03-17 DIAGNOSIS — S8992XD Unspecified injury of left lower leg, subsequent encounter: Secondary | ICD-10-CM | POA: Diagnosis not present

## 2023-03-21 DIAGNOSIS — I10 Essential (primary) hypertension: Secondary | ICD-10-CM | POA: Diagnosis not present

## 2023-03-21 DIAGNOSIS — K7689 Other specified diseases of liver: Secondary | ICD-10-CM | POA: Diagnosis not present

## 2023-03-21 DIAGNOSIS — M5030 Other cervical disc degeneration, unspecified cervical region: Secondary | ICD-10-CM | POA: Diagnosis not present

## 2023-03-21 DIAGNOSIS — J302 Other seasonal allergic rhinitis: Secondary | ICD-10-CM | POA: Diagnosis not present

## 2023-03-21 DIAGNOSIS — R109 Unspecified abdominal pain: Secondary | ICD-10-CM | POA: Diagnosis not present

## 2023-03-21 DIAGNOSIS — J4520 Mild intermittent asthma, uncomplicated: Secondary | ICD-10-CM | POA: Diagnosis not present

## 2023-03-21 DIAGNOSIS — M5136 Other intervertebral disc degeneration, lumbar region: Secondary | ICD-10-CM | POA: Diagnosis not present

## 2023-03-21 DIAGNOSIS — C719 Malignant neoplasm of brain, unspecified: Secondary | ICD-10-CM | POA: Diagnosis not present

## 2023-03-21 DIAGNOSIS — G40909 Epilepsy, unspecified, not intractable, without status epilepticus: Secondary | ICD-10-CM | POA: Diagnosis not present

## 2023-03-21 DIAGNOSIS — K30 Functional dyspepsia: Secondary | ICD-10-CM | POA: Diagnosis not present

## 2023-03-21 DIAGNOSIS — Z92241 Personal history of systemic steroid therapy: Secondary | ICD-10-CM | POA: Diagnosis not present

## 2023-03-21 DIAGNOSIS — F419 Anxiety disorder, unspecified: Secondary | ICD-10-CM | POA: Diagnosis not present

## 2023-03-21 DIAGNOSIS — Z0000 Encounter for general adult medical examination without abnormal findings: Secondary | ICD-10-CM | POA: Diagnosis not present

## 2023-03-21 DIAGNOSIS — D069 Carcinoma in situ of cervix, unspecified: Secondary | ICD-10-CM | POA: Diagnosis not present

## 2023-03-21 DIAGNOSIS — R5383 Other fatigue: Secondary | ICD-10-CM | POA: Diagnosis not present

## 2023-03-21 DIAGNOSIS — F5101 Primary insomnia: Secondary | ICD-10-CM | POA: Diagnosis not present

## 2023-03-21 DIAGNOSIS — G43909 Migraine, unspecified, not intractable, without status migrainosus: Secondary | ICD-10-CM | POA: Diagnosis not present

## 2023-03-22 ENCOUNTER — Other Ambulatory Visit (HOSPITAL_COMMUNITY): Payer: Self-pay

## 2023-03-28 DIAGNOSIS — R5383 Other fatigue: Secondary | ICD-10-CM | POA: Diagnosis not present

## 2023-03-28 DIAGNOSIS — Z92241 Personal history of systemic steroid therapy: Secondary | ICD-10-CM | POA: Diagnosis not present

## 2023-03-28 DIAGNOSIS — K30 Functional dyspepsia: Secondary | ICD-10-CM | POA: Diagnosis not present

## 2023-03-29 ENCOUNTER — Other Ambulatory Visit (HOSPITAL_COMMUNITY): Payer: Self-pay

## 2023-03-30 ENCOUNTER — Other Ambulatory Visit (HOSPITAL_COMMUNITY): Payer: Self-pay

## 2023-03-30 MED ORDER — CYCLOBENZAPRINE HCL 10 MG PO TABS
10.0000 mg | ORAL_TABLET | Freq: Three times a day (TID) | ORAL | 1 refills | Status: DC | PRN
  Filled 2023-03-30: qty 30, 10d supply, fill #0
  Filled 2023-04-11: qty 30, 10d supply, fill #1

## 2023-03-31 ENCOUNTER — Other Ambulatory Visit (HOSPITAL_COMMUNITY): Payer: Self-pay

## 2023-03-31 DIAGNOSIS — C711 Malignant neoplasm of frontal lobe: Secondary | ICD-10-CM | POA: Diagnosis not present

## 2023-04-04 ENCOUNTER — Other Ambulatory Visit: Payer: Self-pay | Admitting: Hematology and Oncology

## 2023-04-04 ENCOUNTER — Other Ambulatory Visit (HOSPITAL_COMMUNITY): Payer: Self-pay

## 2023-04-04 DIAGNOSIS — R0789 Other chest pain: Secondary | ICD-10-CM

## 2023-04-04 DIAGNOSIS — R110 Nausea: Secondary | ICD-10-CM

## 2023-04-04 DIAGNOSIS — R4702 Dysphasia: Secondary | ICD-10-CM

## 2023-04-04 DIAGNOSIS — C711 Malignant neoplasm of frontal lobe: Secondary | ICD-10-CM

## 2023-04-04 DIAGNOSIS — R519 Headache, unspecified: Secondary | ICD-10-CM

## 2023-04-04 DIAGNOSIS — R202 Paresthesia of skin: Secondary | ICD-10-CM

## 2023-04-04 MED ORDER — PROMETHAZINE HCL 12.5 MG PO TABS
ORAL_TABLET | ORAL | 0 refills | Status: DC
  Filled 2023-04-04: qty 13, 4d supply, fill #0
  Filled 2023-04-05: qty 17, 6d supply, fill #0

## 2023-04-05 ENCOUNTER — Other Ambulatory Visit (HOSPITAL_COMMUNITY): Payer: Self-pay

## 2023-04-05 MED ORDER — CARISOPRODOL 350 MG PO TABS
350.0000 mg | ORAL_TABLET | Freq: Three times a day (TID) | ORAL | 0 refills | Status: DC
Start: 2023-04-05 — End: 2023-07-07
  Filled 2023-04-05: qty 270, 90d supply, fill #0

## 2023-04-05 MED ORDER — BUTALBITAL-APAP-CAFFEINE 50-325-40 MG PO TABS
1.0000 | ORAL_TABLET | Freq: Two times a day (BID) | ORAL | 0 refills | Status: DC | PRN
Start: 2023-04-05 — End: 2023-05-04
  Filled 2023-04-05: qty 60, 30d supply, fill #0

## 2023-04-11 ENCOUNTER — Other Ambulatory Visit: Payer: Self-pay

## 2023-04-11 ENCOUNTER — Other Ambulatory Visit (HOSPITAL_COMMUNITY): Payer: Self-pay

## 2023-04-11 ENCOUNTER — Other Ambulatory Visit: Payer: Self-pay | Admitting: Hematology and Oncology

## 2023-04-11 DIAGNOSIS — C719 Malignant neoplasm of brain, unspecified: Secondary | ICD-10-CM

## 2023-04-11 MED ORDER — HYDROCODONE-ACETAMINOPHEN 10-325 MG PO TABS
1.0000 | ORAL_TABLET | Freq: Three times a day (TID) | ORAL | 0 refills | Status: DC | PRN
  Filled 2023-04-11: qty 90, 30d supply, fill #0

## 2023-04-27 ENCOUNTER — Other Ambulatory Visit (HOSPITAL_COMMUNITY): Payer: Self-pay

## 2023-04-27 MED ORDER — CYCLOBENZAPRINE HCL 10 MG PO TABS
10.0000 mg | ORAL_TABLET | Freq: Three times a day (TID) | ORAL | 1 refills | Status: DC | PRN
  Filled 2023-04-27: qty 30, 10d supply, fill #0
  Filled 2023-05-04 – 2023-05-05 (×2): qty 30, 10d supply, fill #1

## 2023-05-04 ENCOUNTER — Other Ambulatory Visit: Payer: Self-pay | Admitting: Hematology and Oncology

## 2023-05-04 ENCOUNTER — Other Ambulatory Visit (HOSPITAL_COMMUNITY): Payer: Self-pay

## 2023-05-04 DIAGNOSIS — C719 Malignant neoplasm of brain, unspecified: Secondary | ICD-10-CM | POA: Diagnosis not present

## 2023-05-04 MED ORDER — BUTALBITAL-APAP-CAFFEINE 50-325-40 MG PO TABS
1.0000 | ORAL_TABLET | Freq: Two times a day (BID) | ORAL | 0 refills | Status: DC | PRN
Start: 2023-05-04 — End: 2023-05-29
  Filled 2023-05-04 – 2023-05-05 (×2): qty 60, 30d supply, fill #0

## 2023-05-05 ENCOUNTER — Other Ambulatory Visit (HOSPITAL_COMMUNITY): Payer: Self-pay

## 2023-05-08 ENCOUNTER — Other Ambulatory Visit (HOSPITAL_COMMUNITY): Payer: Self-pay

## 2023-05-08 DIAGNOSIS — M25562 Pain in left knee: Secondary | ICD-10-CM | POA: Diagnosis not present

## 2023-05-08 MED ORDER — PROMETHAZINE HCL 12.5 MG PO TABS
12.5000 mg | ORAL_TABLET | Freq: Three times a day (TID) | ORAL | 0 refills | Status: DC | PRN
  Filled 2023-05-08 (×2): qty 30, 10d supply, fill #0

## 2023-05-09 ENCOUNTER — Other Ambulatory Visit: Payer: Self-pay | Admitting: Hematology and Oncology

## 2023-05-09 ENCOUNTER — Other Ambulatory Visit (HOSPITAL_COMMUNITY): Payer: Self-pay

## 2023-05-09 DIAGNOSIS — J069 Acute upper respiratory infection, unspecified: Secondary | ICD-10-CM | POA: Diagnosis not present

## 2023-05-09 DIAGNOSIS — M222X2 Patellofemoral disorders, left knee: Secondary | ICD-10-CM | POA: Diagnosis not present

## 2023-05-09 DIAGNOSIS — C719 Malignant neoplasm of brain, unspecified: Secondary | ICD-10-CM

## 2023-05-09 MED ORDER — AZITHROMYCIN 250 MG PO TABS
ORAL_TABLET | ORAL | 0 refills | Status: DC
  Filled 2023-05-09: qty 6, 5d supply, fill #0

## 2023-05-09 MED ORDER — ALBUTEROL SULFATE HFA 108 (90 BASE) MCG/ACT IN AERS
1.0000 | INHALATION_SPRAY | RESPIRATORY_TRACT | 1 refills | Status: AC | PRN
  Filled 2023-05-09: qty 6.7, 25d supply, fill #0
  Filled 2024-01-04: qty 6.7, 25d supply, fill #1

## 2023-05-10 ENCOUNTER — Other Ambulatory Visit (HOSPITAL_COMMUNITY): Payer: Self-pay

## 2023-05-10 MED ORDER — HYDROCODONE-ACETAMINOPHEN 10-325 MG PO TABS
1.0000 | ORAL_TABLET | Freq: Three times a day (TID) | ORAL | 0 refills | Status: DC | PRN
  Filled 2023-05-10: qty 90, 30d supply, fill #0

## 2023-05-10 MED ORDER — LOPERAMIDE HCL 2 MG PO CAPS
2.0000 mg | ORAL_CAPSULE | Freq: Four times a day (QID) | ORAL | 0 refills | Status: AC
  Filled 2023-05-10: qty 30, 8d supply, fill #0

## 2023-05-10 MED ORDER — LORAZEPAM 0.5 MG PO TABS
0.5000 mg | ORAL_TABLET | Freq: Every day | ORAL | 0 refills | Status: DC | PRN
  Filled 2023-05-10: qty 30, 30d supply, fill #0

## 2023-05-11 ENCOUNTER — Other Ambulatory Visit (HOSPITAL_COMMUNITY): Payer: Self-pay

## 2023-05-11 MED ORDER — CYCLOBENZAPRINE HCL 10 MG PO TABS
10.0000 mg | ORAL_TABLET | Freq: Three times a day (TID) | ORAL | 1 refills | Status: DC | PRN
  Filled 2023-05-11: qty 30, 10d supply, fill #0
  Filled 2023-05-22: qty 30, 10d supply, fill #1

## 2023-05-12 ENCOUNTER — Other Ambulatory Visit (HOSPITAL_COMMUNITY): Payer: Self-pay

## 2023-05-22 DIAGNOSIS — M222X2 Patellofemoral disorders, left knee: Secondary | ICD-10-CM | POA: Diagnosis not present

## 2023-05-26 DIAGNOSIS — C711 Malignant neoplasm of frontal lobe: Secondary | ICD-10-CM | POA: Diagnosis not present

## 2023-05-29 ENCOUNTER — Other Ambulatory Visit (HOSPITAL_COMMUNITY): Payer: Self-pay

## 2023-05-29 ENCOUNTER — Other Ambulatory Visit: Payer: Self-pay | Admitting: Hematology and Oncology

## 2023-05-29 MED ORDER — CYCLOBENZAPRINE HCL 10 MG PO TABS
10.0000 mg | ORAL_TABLET | Freq: Three times a day (TID) | ORAL | 1 refills | Status: DC | PRN
  Filled 2023-05-29: qty 30, 10d supply, fill #0
  Filled 2023-06-06 – 2023-06-07 (×2): qty 30, 10d supply, fill #1

## 2023-05-30 ENCOUNTER — Other Ambulatory Visit (HOSPITAL_COMMUNITY): Payer: Self-pay

## 2023-05-30 DIAGNOSIS — C711 Malignant neoplasm of frontal lobe: Secondary | ICD-10-CM | POA: Diagnosis not present

## 2023-05-30 DIAGNOSIS — M4802 Spinal stenosis, cervical region: Secondary | ICD-10-CM | POA: Diagnosis not present

## 2023-05-30 DIAGNOSIS — M2578 Osteophyte, vertebrae: Secondary | ICD-10-CM | POA: Diagnosis not present

## 2023-05-30 MED ORDER — BUTALBITAL-APAP-CAFFEINE 50-325-40 MG PO TABS
1.0000 | ORAL_TABLET | Freq: Two times a day (BID) | ORAL | 0 refills | Status: DC | PRN
  Filled 2023-05-30 – 2023-06-02 (×2): qty 60, 30d supply, fill #0

## 2023-05-31 ENCOUNTER — Other Ambulatory Visit (HOSPITAL_COMMUNITY): Payer: Self-pay

## 2023-06-02 ENCOUNTER — Other Ambulatory Visit (HOSPITAL_COMMUNITY): Payer: Self-pay

## 2023-06-02 DIAGNOSIS — C711 Malignant neoplasm of frontal lobe: Secondary | ICD-10-CM | POA: Diagnosis not present

## 2023-06-02 DIAGNOSIS — M4802 Spinal stenosis, cervical region: Secondary | ICD-10-CM | POA: Diagnosis not present

## 2023-06-06 ENCOUNTER — Other Ambulatory Visit: Payer: Self-pay | Admitting: Hematology and Oncology

## 2023-06-06 ENCOUNTER — Other Ambulatory Visit (HOSPITAL_COMMUNITY): Payer: Self-pay

## 2023-06-06 DIAGNOSIS — C719 Malignant neoplasm of brain, unspecified: Secondary | ICD-10-CM

## 2023-06-07 ENCOUNTER — Other Ambulatory Visit (HOSPITAL_COMMUNITY): Payer: Self-pay

## 2023-06-07 MED ORDER — HYDROCODONE-ACETAMINOPHEN 10-325 MG PO TABS
1.0000 | ORAL_TABLET | Freq: Three times a day (TID) | ORAL | 0 refills | Status: DC | PRN
  Filled 2023-06-07: qty 90, 30d supply, fill #0

## 2023-06-07 MED ORDER — PROMETHAZINE HCL 12.5 MG PO TABS
12.5000 mg | ORAL_TABLET | Freq: Three times a day (TID) | ORAL | 0 refills | Status: AC | PRN
  Filled 2023-06-07: qty 30, 10d supply, fill #0

## 2023-06-08 ENCOUNTER — Other Ambulatory Visit (HOSPITAL_COMMUNITY): Payer: Self-pay

## 2023-06-14 ENCOUNTER — Other Ambulatory Visit (HOSPITAL_COMMUNITY): Payer: Self-pay

## 2023-06-14 ENCOUNTER — Other Ambulatory Visit: Payer: Self-pay | Admitting: Hematology and Oncology

## 2023-06-14 MED ORDER — LORAZEPAM 0.5 MG PO TABS
0.5000 mg | ORAL_TABLET | Freq: Every day | ORAL | 0 refills | Status: DC | PRN
  Filled 2023-06-14: qty 30, 30d supply, fill #0

## 2023-06-14 MED ORDER — CYCLOBENZAPRINE HCL 10 MG PO TABS
10.0000 mg | ORAL_TABLET | Freq: Three times a day (TID) | ORAL | 1 refills | Status: DC | PRN
  Filled 2023-06-14: qty 30, 10d supply, fill #0
  Filled 2023-06-27: qty 30, 10d supply, fill #1

## 2023-06-17 ENCOUNTER — Other Ambulatory Visit (HOSPITAL_COMMUNITY): Payer: Self-pay

## 2023-06-19 ENCOUNTER — Other Ambulatory Visit (HOSPITAL_COMMUNITY): Payer: Self-pay

## 2023-06-27 ENCOUNTER — Other Ambulatory Visit: Payer: Self-pay | Admitting: Hematology and Oncology

## 2023-06-27 ENCOUNTER — Other Ambulatory Visit (HOSPITAL_COMMUNITY): Payer: Self-pay

## 2023-06-27 ENCOUNTER — Other Ambulatory Visit: Payer: Self-pay

## 2023-06-27 MED ORDER — BUTALBITAL-APAP-CAFFEINE 50-325-40 MG PO TABS
1.0000 | ORAL_TABLET | Freq: Two times a day (BID) | ORAL | 0 refills | Status: DC | PRN
  Filled 2023-06-27: qty 60, 30d supply, fill #0

## 2023-07-04 ENCOUNTER — Other Ambulatory Visit (HOSPITAL_COMMUNITY): Payer: Self-pay

## 2023-07-04 ENCOUNTER — Other Ambulatory Visit: Payer: Self-pay | Admitting: Hematology and Oncology

## 2023-07-04 DIAGNOSIS — G950 Syringomyelia and syringobulbia: Secondary | ICD-10-CM | POA: Diagnosis not present

## 2023-07-04 DIAGNOSIS — M5020 Other cervical disc displacement, unspecified cervical region: Secondary | ICD-10-CM | POA: Diagnosis not present

## 2023-07-04 DIAGNOSIS — G43E09 Chronic migraine with aura, not intractable, without status migrainosus: Secondary | ICD-10-CM | POA: Diagnosis not present

## 2023-07-04 DIAGNOSIS — C719 Malignant neoplasm of brain, unspecified: Secondary | ICD-10-CM

## 2023-07-04 MED ORDER — CYCLOBENZAPRINE HCL 10 MG PO TABS
10.0000 mg | ORAL_TABLET | Freq: Three times a day (TID) | ORAL | 1 refills | Status: DC | PRN
  Filled 2023-07-04: qty 30, 10d supply, fill #0
  Filled 2023-07-18 (×2): qty 30, 10d supply, fill #1

## 2023-07-04 MED ORDER — HYDROCODONE-ACETAMINOPHEN 10-325 MG PO TABS
1.0000 | ORAL_TABLET | Freq: Three times a day (TID) | ORAL | 0 refills | Status: DC | PRN
  Filled 2023-07-04 – 2023-07-05 (×2): qty 90, 30d supply, fill #0

## 2023-07-05 ENCOUNTER — Other Ambulatory Visit (HOSPITAL_COMMUNITY): Payer: Self-pay

## 2023-07-06 ENCOUNTER — Inpatient Hospital Stay: Payer: Medicare Other | Attending: Hematology and Oncology | Admitting: Hematology and Oncology

## 2023-07-06 ENCOUNTER — Encounter: Payer: Self-pay | Admitting: Hematology and Oncology

## 2023-07-06 VITALS — BP 158/133 | HR 116 | Resp 18 | Ht 68.0 in | Wt 166.4 lb

## 2023-07-06 DIAGNOSIS — R52 Pain, unspecified: Secondary | ICD-10-CM | POA: Insufficient documentation

## 2023-07-06 DIAGNOSIS — M792 Neuralgia and neuritis, unspecified: Secondary | ICD-10-CM

## 2023-07-06 DIAGNOSIS — C711 Malignant neoplasm of frontal lobe: Secondary | ICD-10-CM | POA: Insufficient documentation

## 2023-07-06 DIAGNOSIS — Z881 Allergy status to other antibiotic agents status: Secondary | ICD-10-CM | POA: Insufficient documentation

## 2023-07-06 DIAGNOSIS — E559 Vitamin D deficiency, unspecified: Secondary | ICD-10-CM | POA: Diagnosis not present

## 2023-07-06 DIAGNOSIS — Z888 Allergy status to other drugs, medicaments and biological substances status: Secondary | ICD-10-CM | POA: Diagnosis not present

## 2023-07-06 DIAGNOSIS — Z79899 Other long term (current) drug therapy: Secondary | ICD-10-CM | POA: Diagnosis not present

## 2023-07-06 DIAGNOSIS — G44021 Chronic cluster headache, intractable: Secondary | ICD-10-CM | POA: Diagnosis not present

## 2023-07-06 DIAGNOSIS — Z885 Allergy status to narcotic agent status: Secondary | ICD-10-CM | POA: Insufficient documentation

## 2023-07-06 NOTE — Assessment & Plan Note (Signed)
She has chronic headache which is multifactorial, related to prior resection with neuropathic pain component Ultimately, for now, she will continue gabapentin, Fioricet, Soma as well as lorazepam to take as needed She will also continue her pain medicine for now

## 2023-07-06 NOTE — Progress Notes (Signed)
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 I have reviewed documentation from [facility] So far, she had multiple side effects from oral chemotherapy I will defer to her physician for management  Vitamin D deficiency She is known to have vitamin D deficiency and is not taking vitamin D supplement consistently I suspect that is contributing to her joint pain I recommend high-dose vitamin D replacement therapy I recommend the patient to reach out to her primary oncologist at [facility] to check vitamin D level  Intractable chronic cluster headache The patient has complex chronic headaches, with difficulties with medication taper in the past Due to multiple different side effects from her chemotherapy, she is not ready for medication taper I will continue to refill her prescription pain medicine for now without changes until I see her back in 6 months  Neuropathic pain She has chronic headache which is multifactorial, related to prior resection with neuropathic pain component Ultimately, for now, she will continue gabapentin, Fioricet, Soma as well as lorazepam to take as needed She will also continue her pain medicine for now  No orders of the defined types were placed in this encounter.   All questions were answered. The patient knows to call the clinic with any problems, questions  or concerns. The total time spent in the appointment was 30 minutes encounter with patients including review of chart and various tests results, discussions about plan of care and coordination of care plan   Artis Delay, MD 07/06/2023 5:39 PM  INTERVAL HISTORY: Please see below for problem oriented charting. she returns for surveillance follow-up  She is here with her husband She is undergoing a lot of stress Her husband lost his job several months ago She is undergoing chemotherapy under the guidance of neuro oncologist at [facility] for the past few months She had a lot of side effects including diarrhea and joint pain I reviewed her medications.  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  Her chronic headaches are about the same  REVIEW OF SYSTEMS:   Constitutional: Denies fevers, chills or abnormal weight loss Eyes: Denies blurriness of vision Ears, nose, mouth, throat, and face: Denies mucositis or sore throat Respiratory: Denies cough, dyspnea or wheezes Cardiovascular: Denies palpitation, chest discomfort or lower extremity swelling Skin: Denies abnormal skin rashes Lymphatics: Denies new lymphadenopathy or easy bruising Behavioral/Psych: Mood is stable, no new changes  All other systems were reviewed with the patient and are negative.  I have reviewed the past medical history, past surgical history, social history and family history with the patient and they are unchanged from previous note.  ALLERGIES:  is allergic to adhesive [tape], soma [carisoprodol], tramadol hcl, nurtec [rimegepant sulfate], latex, medical adhesive remover, percocet [oxycodone-acetaminophen], prednisone, shellfish allergy, topamax [topiramate], vancomycin, zofran [ondansetron hcl], and zomig [zolmitriptan].  MEDICATIONS:  Current Outpatient Medications  Medication Sig Dispense Refill   albuterol (VENTOLIN HFA) 108 (90 Base) MCG/ACT  inhaler Inhale  1 puff into the lungs every 4 (four) hours as needed. 6.7 g 1   butalbital-acetaminophen-caffeine (FIORICET) 50-325-40 MG tablet Take 1 tablet by mouth 2 times daily as needed for headache. 60 tablet 0   carisoprodol (SOMA) 350 MG tablet Take 1 tablet (350 mg total) by mouth 3 (three) times daily. 270 tablet 0   cyclobenzaprine (FLEXERIL) 10 MG tablet Take 1 tablet (10 mg total) by mouth 3 (three) times daily as needed for pain and spasms. 30 tablet 1   divalproex (DEPAKOTE ER) 250 MG 24 hr tablet Take 2 tablets (500 mg) by mouth 3 times daily. 180 tablet 11   EPINEPHrine 0.3 mg/0.3 mL IJ SOAJ injection Inject as directed as needed for severe allergic reaction 2 each 1   gabapentin (NEURONTIN) 300 MG capsule Take 3 capsules by mouth in the morning, 3 capsules at noon,  and 4 capsules at bedtime 300 capsule 11   HYDROcodone-acetaminophen (NORCO) 10-325 MG tablet Take 1 tablet by mouth 3 times daily as needed. 90 tablet 0   loperamide (IMODIUM) 2 MG capsule Take 1 capsule (2 mg total) by mouth 4 (four) times daily as needed for up to 10 days. 30 capsule 0   LORazepam (ATIVAN) 0.5 MG tablet Take 1 tablet by mouth daily as needed for anxiety. 30 tablet 0   promethazine (PHENERGAN) 12.5 MG tablet Take 1 tablet (12.5 mg total) by mouth every 8 (eight) hours as needed. 30 tablet 0   propranolol (INDERAL) 20 MG tablet Take 1 tablet (20 mg total) by mouth, if no effect after 1 hour take 2nd tablet. 180 tablet 1   TIBSOVO 250 MG tablet Take 250 mg by mouth at bedtime. 2 tabs a bed daily     zolmitriptan (ZOMIG) 5 MG tablet Take 1 tablet by mouth at onset of migraine. Do not take more 3 tablets per week 10 tablet 11   No current facility-administered medications for this visit.    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   09/27/2013 Pathology Results   Accession: [id number] pathology showed grade 2 oligodendroglioma with mutant IDH1 protein, ATRX, OLIG 2 expression. p53 is negative, low Ki-67 labeling index. Molecular SNP array studies demonstrate whole arm 1p19q codeletion."   10/28/2013 Imaging   Ct scan of head showed possible abscess.   11/06/2013 Imaging   Repeat CT scan of the head showed resolution of abscess.   01/23/2014 Surgery   Dr. Franky Macho perform a subtotal gross resection of the tumor.   01/24/2014 Imaging   Repeat imaging studies showed persistent and residual disease.   03/03/2014 - 09/01/2014 Chemotherapy   She was started on Temodar, 5 days every 28 days   03/13/2014 Imaging   Repeat MRI showed no evidence of progression of disease.   07/14/2014 Imaging   Repeat MRI showed no evidence of disease progression.   09/12/2014 Imaging   MRI of the liver show mild regression of the size of lesions, presumed to be benign   09/15/2014 Imaging   Repeat MRI of the brain show persistent abnormalities, suspicious for residual disease   03/02/2015 Imaging    MRI brain at [facility] show persistent abnormalities   08/10/2015 Imaging   Repeat MRI [facility] showed stable disease   08/24/2015 - 02/08/2016 Chemotherapy   She was started on Lomustine, Procarbazine and Vincristine chemotherapy  08/30/2015 Imaging   CT chest showed no evidence of aspiration pneumonia   11/18/2015 Imaging   MRI brain showed status post resection of right frontal lobe tumor without residual or recurrent tumor. 2. Otherwise negative MRI of the brain.    03/02/2016 Imaging   Stable MRI post right frontal lobe tumor resection. Negative for recurrent tumor   06/01/2016 Imaging   Stable MRI post right frontal lobe tumor resection. No recurrent tumor identified   08/19/2016 Imaging   Unchanged examination without tumor recurrence.   11/17/2016 Imaging   Stable right frontal resection cavity. Stable focal mass effect within the  right middle frontal gyrus at the superior margin of the cavity which may represent nonenhancing neoplasm. No evidence for tumor progression. Otherwise stable MRI of the brain   12/16/2016 Imaging   MR brain: 1. No acute intracranial abnormality. 2. Unchanged appearance of right frontal lobe resection cavity and surrounding hyperintense T2 weighted signal.   02/04/2017 Imaging   Repeat MRI Normal pituitary   Stable postop changes of right frontal tumor resection   05/12/2017 Imaging   The left ventricular size is normal. There is normal left ventricular wall thickness.  Left ventricular systolic function is normal. LV ejection fraction = 60-65%.  Left ventricular filling pattern is normal. The left ventricular wall motion is normal. The right ventricle is normal in size and function. There is no significant valvular stenosis or regurgitation No pulmonary hypertension. IVC size was normal. There is no pericardial effusion. There is no comparison study available   05/13/2017 Imaging   1.  No evidence of acute pulmonary thromboembolic disease. 2.  There is an 8 mm right lower lobe groundglass nodule. Statistically, in this age group, this is most likely to reflect benign etiologies such as a tiny focus of infection or nonspecific inflammation. 4 mm solid right upper lobe lung nodule. A CT scan could be obtained in 12 months to further evaluate if the patient has any known risk factors for malignancy. 3.  There is a 1.3 cm left adrenal nodule. This finding is incompletely characterized although and is slightly increased in size relative to the abdominal MRI from February 2016. Although this finding is most likely benign, recommend follow-up adrenal protocol CT in one year.   05/13/2017 Imaging   MRI brain 1.  No acute intracranial abnormality. 2.  Postsurgical changes of a right frontal craniotomy for resection of a reported oligodendroglioma. Hyperintense T2/FLAIR signal around the  resection cavity and in the subcortical white matter of a gyrus along the superior margin of the cavity is unchanged from 01/05/2015. No evidence of disease progression.   12/15/2017 Imaging   MRI brain at [facility] 1.  No acute intracranial abnormality. 2.  Postsurgical changes of right frontal craniotomy for resection of reported oligodendroglioma. No evidence of disease progression   12/28/2017 Procedure   Successful right IJ vein Port-A-Cath explant.   04/24/2018 Imaging   Stable post treatment appearance, RIGHT frontal oligodendroglioma.  No posttraumatic sequelae are evident.   01/30/2019 Imaging   Stable appearance of treated right frontal oligodendroglioma. No change from 04/24/2018.   11/18/2019 Imaging   1. Stable postoperative changes of the right frontal lobe without evidence for residual or recurrent disease. 2. No acute intracranial abnormality or significant interval change.   03/18/2022 Imaging   1.  Stable exam from 07/28/2021. Compared to more remote prior exam from 2018, there is an area of T2 hyperintense signal in the white matter deep to the resection cavity  that has mildly increased in extent. Recommend close attention to this region on follow-up; differential considerations for this finding include evolving posttreatment changes and slowly growing tumor.  2.  No acute intracranial abnormality.    12/2022 -  Chemotherapy   She started taking ivosidenib   05/26/2023 Imaging   Stable post resection, posttreatment changes of right frontal lobe tumor resection. There is no evidence of disease progression. T2 hyperintense signal in the right frontal lobe adjacent to the resection cavity is stable consistent with chronic gliotic changes although small volume of stable nonenhancing tumor cannot be excluded.      PHYSICAL EXAMINATION: ECOG PERFORMANCE STATUS: 1 - Symptomatic but completely ambulatory  Vitals:   07/06/23 1506  BP: (!) 158/133  Pulse: (!) 116  Resp: 18   SpO2: 100%   Filed Weights   07/06/23 1506  Weight: 166 lb 6.4 oz (75.5 kg)    GENERAL:alert, no distress and comfortable   LABORATORY DATA:  I have reviewed the data as listed    Component Value Date/Time   NA 139 04/13/2022 2307   NA 140 08/07/2019 0000   NA 140 06/15/2017 1207   K 3.4 (L) 04/13/2022 2307   K 3.9 06/15/2017 1207   CL 101 04/13/2022 2307   CO2 30 04/13/2022 2307   CO2 24 06/15/2017 1207   GLUCOSE 91 04/13/2022 2307   GLUCOSE 85 06/15/2017 1207   BUN 7 04/13/2022 2307   BUN 12 08/07/2019 0000   BUN 5.8 (L) 06/15/2017 1207   CREATININE 0.55 04/13/2022 2307   CREATININE 0.76 02/24/2020 1018   CREATININE 0.7 06/15/2017 1207   CALCIUM 9.5 04/13/2022 2307   CALCIUM 8.5 06/15/2017 1207   PROT 7.1 04/13/2022 2307   PROT 6.7 06/15/2017 1207   ALBUMIN 4.1 04/13/2022 2307   ALBUMIN 3.9 06/15/2017 1207   AST 11 (L) 04/13/2022 2307   AST 17 02/24/2020 1018   AST 14 06/15/2017 1207   ALT 11 04/13/2022 2307   ALT 15 02/24/2020 1018   ALT 11 06/15/2017 1207   ALKPHOS 62 04/13/2022 2307   ALKPHOS 42 06/15/2017 1207   BILITOT 0.3 04/13/2022 2307   BILITOT 0.5 02/24/2020 1018   BILITOT 0.36 06/15/2017 1207   GFRNONAA >60 04/13/2022 2307   GFRNONAA >60 02/24/2020 1018   GFRAA >60 02/24/2020 1018    No results found for: "SPEP", "UPEP"  Lab Results  Component Value Date   WBC 8.3 04/13/2022   NEUTROABS 6.9 04/13/2022   HGB 13.4 04/13/2022   HCT 40.0 04/13/2022   MCV 97.6 04/13/2022   PLT 216 04/13/2022      Chemistry      Component Value Date/Time   NA 139 04/13/2022 2307   NA 140 08/07/2019 0000   NA 140 06/15/2017 1207   K 3.4 (L) 04/13/2022 2307   K 3.9 06/15/2017 1207   CL 101 04/13/2022 2307   CO2 30 04/13/2022 2307   CO2 24 06/15/2017 1207   BUN 7 04/13/2022 2307   BUN 12 08/07/2019 0000   BUN 5.8 (L) 06/15/2017 1207   CREATININE 0.55 04/13/2022 2307   CREATININE 0.76 02/24/2020 1018   CREATININE 0.7 06/15/2017 1207      Component  Value Date/Time   CALCIUM 9.5 04/13/2022 2307   CALCIUM 8.5 06/15/2017 1207   ALKPHOS 62 04/13/2022 2307   ALKPHOS 42 06/15/2017 1207   AST 11 (L) 04/13/2022 2307   AST 17 02/24/2020 1018   AST 14 06/15/2017 1207   ALT  11 04/13/2022 2307   ALT 15 02/24/2020 1018   ALT 11 06/15/2017 1207   BILITOT 0.3 04/13/2022 2307   BILITOT 0.5 02/24/2020 1018   BILITOT 0.36 06/15/2017 1207

## 2023-07-06 NOTE — Assessment & Plan Note (Signed)
I have reviewed documentation from [facility] So far, she had multiple side effects from oral chemotherapy I will defer to her physician for management

## 2023-07-06 NOTE — Assessment & Plan Note (Signed)
The patient has complex chronic headaches, with difficulties with medication taper in the past Due to multiple different side effects from her chemotherapy, she is not ready for medication taper I will continue to refill her prescription pain medicine for now without changes until I see her back in 6 months

## 2023-07-06 NOTE — Assessment & Plan Note (Signed)
She is known to have vitamin D deficiency and is not taking vitamin D supplement consistently I suspect that is contributing to her joint pain I recommend high-dose vitamin D replacement therapy I recommend the patient to reach out to her primary oncologist at [facility] to check vitamin D level

## 2023-07-07 ENCOUNTER — Other Ambulatory Visit (HOSPITAL_COMMUNITY): Payer: Self-pay

## 2023-07-07 ENCOUNTER — Other Ambulatory Visit: Payer: Self-pay | Admitting: Hematology and Oncology

## 2023-07-07 ENCOUNTER — Other Ambulatory Visit: Payer: Self-pay

## 2023-07-07 DIAGNOSIS — R202 Paresthesia of skin: Secondary | ICD-10-CM

## 2023-07-07 DIAGNOSIS — R0789 Other chest pain: Secondary | ICD-10-CM

## 2023-07-07 DIAGNOSIS — R519 Headache, unspecified: Secondary | ICD-10-CM

## 2023-07-07 DIAGNOSIS — R110 Nausea: Secondary | ICD-10-CM

## 2023-07-07 DIAGNOSIS — R4702 Dysphasia: Secondary | ICD-10-CM

## 2023-07-07 DIAGNOSIS — C711 Malignant neoplasm of frontal lobe: Secondary | ICD-10-CM

## 2023-07-07 MED ORDER — CARISOPRODOL 350 MG PO TABS
350.0000 mg | ORAL_TABLET | Freq: Three times a day (TID) | ORAL | 0 refills | Status: DC
Start: 2023-07-07 — End: 2023-10-13
  Filled 2023-07-07: qty 270, 90d supply, fill #0

## 2023-07-08 ENCOUNTER — Other Ambulatory Visit (HOSPITAL_COMMUNITY): Payer: Self-pay

## 2023-07-10 ENCOUNTER — Other Ambulatory Visit (HOSPITAL_COMMUNITY): Payer: Self-pay

## 2023-07-18 ENCOUNTER — Other Ambulatory Visit (HOSPITAL_COMMUNITY): Payer: Self-pay

## 2023-07-25 ENCOUNTER — Other Ambulatory Visit (HOSPITAL_COMMUNITY): Payer: Self-pay

## 2023-07-25 ENCOUNTER — Other Ambulatory Visit: Payer: Self-pay | Admitting: Hematology and Oncology

## 2023-07-25 MED ORDER — LORAZEPAM 0.5 MG PO TABS
0.5000 mg | ORAL_TABLET | Freq: Every day | ORAL | 0 refills | Status: DC | PRN
  Filled 2023-07-25: qty 30, 30d supply, fill #0

## 2023-07-25 MED ORDER — BUTALBITAL-APAP-CAFFEINE 50-325-40 MG PO TABS
1.0000 | ORAL_TABLET | Freq: Two times a day (BID) | ORAL | 0 refills | Status: DC | PRN
  Filled 2023-07-25: qty 60, 30d supply, fill #0

## 2023-07-25 MED ORDER — CYCLOBENZAPRINE HCL 10 MG PO TABS
10.0000 mg | ORAL_TABLET | Freq: Three times a day (TID) | ORAL | 1 refills | Status: DC | PRN
  Filled 2023-07-25: qty 30, 10d supply, fill #0
  Filled 2023-08-02: qty 30, 10d supply, fill #1

## 2023-07-27 ENCOUNTER — Other Ambulatory Visit: Payer: Self-pay | Admitting: Hematology and Oncology

## 2023-07-27 DIAGNOSIS — C719 Malignant neoplasm of brain, unspecified: Secondary | ICD-10-CM

## 2023-07-28 ENCOUNTER — Other Ambulatory Visit (HOSPITAL_COMMUNITY): Payer: Self-pay

## 2023-07-28 MED ORDER — HYDROCODONE-ACETAMINOPHEN 10-325 MG PO TABS
1.0000 | ORAL_TABLET | Freq: Three times a day (TID) | ORAL | 0 refills | Status: DC | PRN
  Filled 2023-07-28 – 2023-08-02 (×2): qty 90, 30d supply, fill #0

## 2023-08-02 ENCOUNTER — Other Ambulatory Visit: Payer: Self-pay

## 2023-08-02 ENCOUNTER — Other Ambulatory Visit (HOSPITAL_COMMUNITY): Payer: Self-pay

## 2023-08-11 ENCOUNTER — Other Ambulatory Visit: Payer: Self-pay

## 2023-08-11 ENCOUNTER — Other Ambulatory Visit (HOSPITAL_COMMUNITY): Payer: Self-pay

## 2023-08-11 MED ORDER — CYCLOBENZAPRINE HCL 10 MG PO TABS
10.0000 mg | ORAL_TABLET | Freq: Three times a day (TID) | ORAL | 1 refills | Status: DC | PRN
  Filled 2023-08-11: qty 30, 10d supply, fill #0
  Filled 2023-08-19 (×2): qty 30, 10d supply, fill #1

## 2023-08-19 ENCOUNTER — Other Ambulatory Visit (HOSPITAL_COMMUNITY): Payer: Self-pay

## 2023-08-22 ENCOUNTER — Other Ambulatory Visit: Payer: Self-pay | Admitting: Hematology and Oncology

## 2023-08-23 ENCOUNTER — Other Ambulatory Visit (HOSPITAL_COMMUNITY): Payer: Self-pay

## 2023-08-23 MED ORDER — BUTALBITAL-APAP-CAFFEINE 50-325-40 MG PO TABS
1.0000 | ORAL_TABLET | Freq: Two times a day (BID) | ORAL | 0 refills | Status: DC | PRN
  Filled 2023-08-23: qty 60, 30d supply, fill #0

## 2023-08-29 ENCOUNTER — Other Ambulatory Visit: Payer: Self-pay | Admitting: Hematology and Oncology

## 2023-08-29 ENCOUNTER — Other Ambulatory Visit (HOSPITAL_COMMUNITY): Payer: Self-pay

## 2023-08-29 DIAGNOSIS — C719 Malignant neoplasm of brain, unspecified: Secondary | ICD-10-CM

## 2023-08-29 MED ORDER — HYDROCODONE-ACETAMINOPHEN 10-325 MG PO TABS
1.0000 | ORAL_TABLET | Freq: Three times a day (TID) | ORAL | 0 refills | Status: DC | PRN
  Filled 2023-08-29: qty 90, 30d supply, fill #0

## 2023-08-29 MED ORDER — CYCLOBENZAPRINE HCL 10 MG PO TABS
10.0000 mg | ORAL_TABLET | Freq: Three times a day (TID) | ORAL | 1 refills | Status: DC | PRN
Start: 2023-08-29 — End: 2023-09-13
  Filled 2023-08-29: qty 30, 10d supply, fill #0
  Filled 2023-09-05: qty 30, 10d supply, fill #1

## 2023-08-30 ENCOUNTER — Ambulatory Visit: Payer: Medicare Other | Admitting: Neurology

## 2023-08-30 ENCOUNTER — Encounter: Payer: Self-pay | Admitting: Neurology

## 2023-09-01 DIAGNOSIS — R5383 Other fatigue: Secondary | ICD-10-CM | POA: Diagnosis not present

## 2023-09-01 DIAGNOSIS — C711 Malignant neoplasm of frontal lobe: Secondary | ICD-10-CM | POA: Diagnosis not present

## 2023-09-01 DIAGNOSIS — G629 Polyneuropathy, unspecified: Secondary | ICD-10-CM | POA: Diagnosis not present

## 2023-09-01 DIAGNOSIS — Z79899 Other long term (current) drug therapy: Secondary | ICD-10-CM | POA: Diagnosis not present

## 2023-09-01 DIAGNOSIS — G43909 Migraine, unspecified, not intractable, without status migrainosus: Secondary | ICD-10-CM | POA: Diagnosis not present

## 2023-09-01 DIAGNOSIS — H547 Unspecified visual loss: Secondary | ICD-10-CM | POA: Diagnosis not present

## 2023-09-01 DIAGNOSIS — R195 Other fecal abnormalities: Secondary | ICD-10-CM | POA: Diagnosis not present

## 2023-09-01 DIAGNOSIS — E559 Vitamin D deficiency, unspecified: Secondary | ICD-10-CM | POA: Diagnosis not present

## 2023-09-01 DIAGNOSIS — G40909 Epilepsy, unspecified, not intractable, without status epilepticus: Secondary | ICD-10-CM | POA: Diagnosis not present

## 2023-09-03 DIAGNOSIS — Z79899 Other long term (current) drug therapy: Secondary | ICD-10-CM | POA: Diagnosis not present

## 2023-09-05 ENCOUNTER — Ambulatory Visit (HOSPITAL_COMMUNITY)
Admission: RE | Admit: 2023-09-05 | Discharge: 2023-09-05 | Disposition: A | Discharge status: Home / Self Care | Payer: Medicare Other | Source: Ambulatory Visit | Attending: Internal Medicine | Admitting: Internal Medicine

## 2023-09-05 ENCOUNTER — Other Ambulatory Visit: Payer: Self-pay | Admitting: Neurology

## 2023-09-05 ENCOUNTER — Other Ambulatory Visit (HOSPITAL_COMMUNITY): Payer: Self-pay

## 2023-09-05 ENCOUNTER — Other Ambulatory Visit: Payer: Self-pay | Admitting: Hematology and Oncology

## 2023-09-05 ENCOUNTER — Other Ambulatory Visit (HOSPITAL_BASED_OUTPATIENT_CLINIC_OR_DEPARTMENT_OTHER): Payer: Self-pay | Admitting: Internal Medicine

## 2023-09-05 ENCOUNTER — Other Ambulatory Visit: Payer: Self-pay

## 2023-09-05 DIAGNOSIS — R1032 Left lower quadrant pain: Secondary | ICD-10-CM | POA: Diagnosis not present

## 2023-09-05 DIAGNOSIS — R195 Other fecal abnormalities: Secondary | ICD-10-CM | POA: Diagnosis not present

## 2023-09-05 DIAGNOSIS — G40109 Localization-related (focal) (partial) symptomatic epilepsy and epileptic syndromes with simple partial seizures, not intractable, without status epilepticus: Secondary | ICD-10-CM

## 2023-09-05 DIAGNOSIS — K921 Melena: Secondary | ICD-10-CM | POA: Insufficient documentation

## 2023-09-05 DIAGNOSIS — G43719 Chronic migraine without aura, intractable, without status migrainosus: Secondary | ICD-10-CM

## 2023-09-05 DIAGNOSIS — R109 Unspecified abdominal pain: Secondary | ICD-10-CM | POA: Diagnosis not present

## 2023-09-05 DIAGNOSIS — K2930 Chronic superficial gastritis without bleeding: Secondary | ICD-10-CM | POA: Diagnosis not present

## 2023-09-05 MED ORDER — IOHEXOL 300 MG/ML  SOLN
100.0000 mL | Freq: Once | INTRAMUSCULAR | Status: AC | PRN
  Administered 2023-09-05: 100 mL via INTRAVENOUS

## 2023-09-05 MED ORDER — LORAZEPAM 0.5 MG PO TABS
0.5000 mg | ORAL_TABLET | Freq: Every day | ORAL | 0 refills | Status: DC | PRN
  Filled 2023-09-05: qty 30, 30d supply, fill #0

## 2023-09-05 MED ORDER — SUCRALFATE 1 G PO TABS
1.0000 g | ORAL_TABLET | Freq: Two times a day (BID) | ORAL | 0 refills | Status: DC
  Filled 2023-09-05: qty 60, 30d supply, fill #0

## 2023-09-05 MED ORDER — IOHEXOL 300 MG/ML  SOLN
30.0000 mL | Freq: Once | INTRAMUSCULAR | Status: AC | PRN
  Administered 2023-09-05: 30 mL via ORAL

## 2023-09-07 ENCOUNTER — Other Ambulatory Visit (HOSPITAL_COMMUNITY): Payer: Self-pay

## 2023-09-07 MED ORDER — DICYCLOMINE HCL 20 MG PO TABS
20.0000 mg | ORAL_TABLET | Freq: Two times a day (BID) | ORAL | 5 refills | Status: DC | PRN
  Filled 2023-09-07: qty 60, 30d supply, fill #0

## 2023-09-12 DIAGNOSIS — S9031XA Contusion of right foot, initial encounter: Secondary | ICD-10-CM | POA: Diagnosis not present

## 2023-09-13 ENCOUNTER — Other Ambulatory Visit (HOSPITAL_COMMUNITY): Payer: Self-pay

## 2023-09-13 MED ORDER — CYCLOBENZAPRINE HCL 10 MG PO TABS
10.0000 mg | ORAL_TABLET | Freq: Three times a day (TID) | ORAL | 1 refills | Status: DC | PRN
  Filled 2023-09-13: qty 30, 10d supply, fill #0
  Filled 2023-09-20 (×2): qty 30, 10d supply, fill #1

## 2023-09-18 ENCOUNTER — Other Ambulatory Visit: Payer: Self-pay | Admitting: Hematology and Oncology

## 2023-09-19 ENCOUNTER — Other Ambulatory Visit (HOSPITAL_COMMUNITY): Payer: Self-pay

## 2023-09-19 MED ORDER — BUTALBITAL-APAP-CAFFEINE 50-325-40 MG PO TABS
1.0000 | ORAL_TABLET | Freq: Two times a day (BID) | ORAL | 0 refills | Status: DC | PRN
  Filled 2023-09-19 – 2023-09-20 (×3): qty 60, 30d supply, fill #0

## 2023-09-20 ENCOUNTER — Other Ambulatory Visit: Payer: Self-pay | Admitting: Hematology and Oncology

## 2023-09-20 ENCOUNTER — Other Ambulatory Visit (HOSPITAL_COMMUNITY): Payer: Self-pay

## 2023-09-20 ENCOUNTER — Other Ambulatory Visit: Payer: Self-pay

## 2023-09-20 MED ORDER — EPINEPHRINE 0.3 MG/0.3ML IJ SOAJ
INTRAMUSCULAR | 1 refills | Status: DC
  Filled 2023-09-20: qty 2, 1d supply, fill #0
  Filled 2023-10-13: qty 2, 1d supply, fill #1

## 2023-09-25 ENCOUNTER — Other Ambulatory Visit: Payer: Self-pay | Admitting: Hematology and Oncology

## 2023-09-25 DIAGNOSIS — C719 Malignant neoplasm of brain, unspecified: Secondary | ICD-10-CM

## 2023-09-25 DIAGNOSIS — R1310 Dysphagia, unspecified: Secondary | ICD-10-CM | POA: Diagnosis not present

## 2023-09-25 DIAGNOSIS — K625 Hemorrhage of anus and rectum: Secondary | ICD-10-CM | POA: Diagnosis not present

## 2023-09-25 DIAGNOSIS — K921 Melena: Secondary | ICD-10-CM | POA: Diagnosis not present

## 2023-09-25 MED ORDER — HYDROCODONE-ACETAMINOPHEN 10-325 MG PO TABS
1.0000 | ORAL_TABLET | Freq: Three times a day (TID) | ORAL | 0 refills | Status: DC | PRN
  Filled 2023-09-25: qty 90, 30d supply, fill #0

## 2023-09-26 ENCOUNTER — Other Ambulatory Visit (HOSPITAL_COMMUNITY): Payer: Self-pay

## 2023-09-27 ENCOUNTER — Other Ambulatory Visit (HOSPITAL_COMMUNITY): Payer: Self-pay

## 2023-09-28 ENCOUNTER — Other Ambulatory Visit (HOSPITAL_COMMUNITY): Payer: Self-pay

## 2023-09-28 MED ORDER — CYCLOBENZAPRINE HCL 10 MG PO TABS
10.0000 mg | ORAL_TABLET | Freq: Three times a day (TID) | ORAL | 1 refills | Status: DC | PRN
  Filled 2023-09-28: qty 30, 10d supply, fill #0
  Filled 2023-10-07: qty 30, 10d supply, fill #1

## 2023-09-29 ENCOUNTER — Other Ambulatory Visit (HOSPITAL_COMMUNITY): Payer: Self-pay

## 2023-09-29 DIAGNOSIS — C711 Malignant neoplasm of frontal lobe: Secondary | ICD-10-CM | POA: Diagnosis not present

## 2023-10-07 ENCOUNTER — Other Ambulatory Visit (HOSPITAL_COMMUNITY): Payer: Self-pay

## 2023-10-13 ENCOUNTER — Other Ambulatory Visit: Payer: Self-pay | Admitting: Hematology and Oncology

## 2023-10-13 ENCOUNTER — Other Ambulatory Visit: Payer: Self-pay

## 2023-10-13 ENCOUNTER — Other Ambulatory Visit (HOSPITAL_COMMUNITY): Payer: Self-pay

## 2023-10-13 DIAGNOSIS — C711 Malignant neoplasm of frontal lobe: Secondary | ICD-10-CM

## 2023-10-13 DIAGNOSIS — R110 Nausea: Secondary | ICD-10-CM

## 2023-10-13 DIAGNOSIS — R0789 Other chest pain: Secondary | ICD-10-CM

## 2023-10-13 DIAGNOSIS — R4702 Dysphasia: Secondary | ICD-10-CM

## 2023-10-13 DIAGNOSIS — R202 Paresthesia of skin: Secondary | ICD-10-CM

## 2023-10-13 DIAGNOSIS — R519 Headache, unspecified: Secondary | ICD-10-CM

## 2023-10-13 MED ORDER — LORAZEPAM 0.5 MG PO TABS
0.5000 mg | ORAL_TABLET | Freq: Every day | ORAL | 0 refills | Status: DC | PRN
  Filled 2023-10-13: qty 30, 30d supply, fill #0

## 2023-10-13 MED ORDER — CARISOPRODOL 350 MG PO TABS
350.0000 mg | ORAL_TABLET | Freq: Three times a day (TID) | ORAL | 0 refills | Status: DC
  Filled 2023-10-13: qty 90, 30d supply, fill #0
  Filled 2023-10-24 – 2023-11-10 (×3): qty 90, 30d supply, fill #1
  Filled 2023-12-12 (×2): qty 90, 30d supply, fill #2

## 2023-10-13 MED ORDER — SUCRALFATE 1 G PO TABS
ORAL_TABLET | ORAL | 0 refills | Status: DC
Start: 2023-10-13 — End: 2024-02-13
  Filled 2023-10-13: qty 60, 30d supply, fill #0

## 2023-10-13 MED ORDER — PROPRANOLOL HCL 20 MG PO TABS
ORAL_TABLET | ORAL | 1 refills | Status: DC
Start: 2023-10-13 — End: 2024-06-07
  Filled 2023-10-13: qty 180, 90d supply, fill #0
  Filled 2024-02-13: qty 180, 90d supply, fill #1

## 2023-10-13 MED ORDER — BUTALBITAL-APAP-CAFFEINE 50-325-40 MG PO TABS
1.0000 | ORAL_TABLET | Freq: Two times a day (BID) | ORAL | 0 refills | Status: DC | PRN
  Filled 2023-10-13 – 2023-10-20 (×2): qty 60, 30d supply, fill #0

## 2023-10-17 ENCOUNTER — Other Ambulatory Visit (HOSPITAL_COMMUNITY): Payer: Self-pay

## 2023-10-17 ENCOUNTER — Other Ambulatory Visit: Payer: Self-pay

## 2023-10-17 MED ORDER — CYCLOBENZAPRINE HCL 10 MG PO TABS
10.0000 mg | ORAL_TABLET | Freq: Three times a day (TID) | ORAL | 1 refills | Status: DC | PRN
  Filled 2023-10-17: qty 30, 10d supply, fill #0
  Filled 2023-10-30: qty 30, 10d supply, fill #1

## 2023-10-18 ENCOUNTER — Other Ambulatory Visit (HOSPITAL_COMMUNITY): Payer: Self-pay

## 2023-10-20 ENCOUNTER — Other Ambulatory Visit (HOSPITAL_COMMUNITY): Payer: Self-pay

## 2023-10-21 ENCOUNTER — Other Ambulatory Visit (HOSPITAL_COMMUNITY): Payer: Self-pay

## 2023-10-24 ENCOUNTER — Other Ambulatory Visit: Payer: Self-pay | Admitting: Hematology and Oncology

## 2023-10-24 ENCOUNTER — Other Ambulatory Visit (HOSPITAL_COMMUNITY): Payer: Self-pay

## 2023-10-24 DIAGNOSIS — C719 Malignant neoplasm of brain, unspecified: Secondary | ICD-10-CM

## 2023-10-24 MED ORDER — HYDROCODONE-ACETAMINOPHEN 10-325 MG PO TABS
1.0000 | ORAL_TABLET | Freq: Three times a day (TID) | ORAL | 0 refills | Status: DC | PRN
Start: 2023-10-24 — End: 2023-11-20
  Filled 2023-10-24: qty 90, 30d supply, fill #0

## 2023-10-30 ENCOUNTER — Other Ambulatory Visit (HOSPITAL_COMMUNITY): Payer: Self-pay

## 2023-11-07 ENCOUNTER — Other Ambulatory Visit (HOSPITAL_COMMUNITY): Payer: Self-pay

## 2023-11-07 DIAGNOSIS — N764 Abscess of vulva: Secondary | ICD-10-CM | POA: Diagnosis not present

## 2023-11-07 MED ORDER — CYCLOBENZAPRINE HCL 10 MG PO TABS
10.0000 mg | ORAL_TABLET | Freq: Three times a day (TID) | ORAL | 1 refills | Status: DC | PRN
  Filled 2023-11-07: qty 30, 10d supply, fill #0
  Filled 2023-11-18: qty 30, 10d supply, fill #1

## 2023-11-07 MED ORDER — SULFAMETHOXAZOLE-TRIMETHOPRIM 800-160 MG PO TABS
1.0000 | ORAL_TABLET | Freq: Two times a day (BID) | ORAL | 0 refills | Status: AC
Start: 2023-11-07 — End: 2023-11-13
  Filled 2023-11-07: qty 10, 5d supply, fill #0

## 2023-11-10 ENCOUNTER — Other Ambulatory Visit (HOSPITAL_COMMUNITY): Payer: Self-pay

## 2023-11-14 ENCOUNTER — Other Ambulatory Visit: Payer: Self-pay | Admitting: Hematology and Oncology

## 2023-11-14 ENCOUNTER — Other Ambulatory Visit (HOSPITAL_COMMUNITY): Payer: Self-pay

## 2023-11-14 MED ORDER — BUTALBITAL-APAP-CAFFEINE 50-325-40 MG PO TABS
1.0000 | ORAL_TABLET | Freq: Two times a day (BID) | ORAL | 0 refills | Status: DC | PRN
  Filled 2023-11-14: qty 60, 30d supply, fill #0

## 2023-11-15 ENCOUNTER — Other Ambulatory Visit (HOSPITAL_COMMUNITY): Payer: Self-pay

## 2023-11-15 ENCOUNTER — Other Ambulatory Visit: Payer: Self-pay

## 2023-11-20 ENCOUNTER — Other Ambulatory Visit: Payer: Self-pay | Admitting: Hematology and Oncology

## 2023-11-20 DIAGNOSIS — C719 Malignant neoplasm of brain, unspecified: Secondary | ICD-10-CM

## 2023-11-21 ENCOUNTER — Other Ambulatory Visit (HOSPITAL_COMMUNITY): Payer: Self-pay

## 2023-11-21 MED ORDER — HYDROCODONE-ACETAMINOPHEN 10-325 MG PO TABS
1.0000 | ORAL_TABLET | Freq: Three times a day (TID) | ORAL | 0 refills | Status: DC | PRN
Start: 2023-11-21 — End: 2023-12-19
  Filled 2023-11-21 – 2023-11-22 (×2): qty 90, 30d supply, fill #0

## 2023-11-22 ENCOUNTER — Other Ambulatory Visit (HOSPITAL_COMMUNITY): Payer: Self-pay

## 2023-11-29 ENCOUNTER — Other Ambulatory Visit (HOSPITAL_COMMUNITY): Payer: Self-pay

## 2023-11-29 MED ORDER — CYCLOBENZAPRINE HCL 10 MG PO TABS
10.0000 mg | ORAL_TABLET | Freq: Three times a day (TID) | ORAL | 1 refills | Status: DC | PRN
  Filled 2023-11-29: qty 30, 10d supply, fill #0
  Filled 2023-12-07 (×2): qty 30, 10d supply, fill #1

## 2023-12-07 ENCOUNTER — Other Ambulatory Visit (HOSPITAL_COMMUNITY): Payer: Self-pay

## 2023-12-07 ENCOUNTER — Other Ambulatory Visit: Payer: Self-pay

## 2023-12-12 ENCOUNTER — Other Ambulatory Visit: Payer: Self-pay | Admitting: Hematology and Oncology

## 2023-12-12 ENCOUNTER — Other Ambulatory Visit (HOSPITAL_COMMUNITY): Payer: Self-pay

## 2023-12-12 MED ORDER — BUTALBITAL-APAP-CAFFEINE 50-325-40 MG PO TABS
1.0000 | ORAL_TABLET | Freq: Two times a day (BID) | ORAL | 0 refills | Status: DC | PRN
  Filled 2023-12-12: qty 60, 30d supply, fill #0

## 2023-12-14 ENCOUNTER — Other Ambulatory Visit (HOSPITAL_COMMUNITY): Payer: Self-pay

## 2023-12-14 MED ORDER — CYCLOBENZAPRINE HCL 10 MG PO TABS
10.0000 mg | ORAL_TABLET | Freq: Three times a day (TID) | ORAL | 1 refills | Status: DC | PRN
  Filled 2023-12-14: qty 30, 10d supply, fill #0
  Filled 2023-12-26: qty 30, 10d supply, fill #1

## 2023-12-16 ENCOUNTER — Other Ambulatory Visit (HOSPITAL_COMMUNITY): Payer: Self-pay

## 2023-12-19 ENCOUNTER — Other Ambulatory Visit: Payer: Self-pay | Admitting: Hematology and Oncology

## 2023-12-19 ENCOUNTER — Other Ambulatory Visit (HOSPITAL_COMMUNITY): Payer: Self-pay

## 2023-12-19 DIAGNOSIS — C719 Malignant neoplasm of brain, unspecified: Secondary | ICD-10-CM

## 2023-12-19 MED ORDER — HYDROCODONE-ACETAMINOPHEN 10-325 MG PO TABS
1.0000 | ORAL_TABLET | Freq: Three times a day (TID) | ORAL | 0 refills | Status: DC | PRN
  Filled 2023-12-19 – 2023-12-20 (×2): qty 90, 30d supply, fill #0

## 2023-12-20 ENCOUNTER — Other Ambulatory Visit (HOSPITAL_COMMUNITY): Payer: Self-pay

## 2023-12-27 ENCOUNTER — Other Ambulatory Visit (HOSPITAL_COMMUNITY): Payer: Self-pay

## 2023-12-27 DIAGNOSIS — R509 Fever, unspecified: Secondary | ICD-10-CM | POA: Diagnosis not present

## 2023-12-27 DIAGNOSIS — J189 Pneumonia, unspecified organism: Secondary | ICD-10-CM | POA: Diagnosis not present

## 2023-12-27 DIAGNOSIS — R051 Acute cough: Secondary | ICD-10-CM | POA: Diagnosis not present

## 2023-12-27 DIAGNOSIS — R0600 Dyspnea, unspecified: Secondary | ICD-10-CM | POA: Diagnosis not present

## 2023-12-27 DIAGNOSIS — R519 Headache, unspecified: Secondary | ICD-10-CM | POA: Diagnosis not present

## 2023-12-27 DIAGNOSIS — J45901 Unspecified asthma with (acute) exacerbation: Secondary | ICD-10-CM | POA: Diagnosis not present

## 2023-12-27 MED ORDER — PREDNISONE 20 MG PO TABS
60.0000 mg | ORAL_TABLET | Freq: Every day | ORAL | 0 refills | Status: DC
  Filled 2023-12-27: qty 15, 5d supply, fill #0

## 2023-12-27 MED ORDER — AMOXICILLIN-POT CLAVULANATE 875-125 MG PO TABS
1.0000 | ORAL_TABLET | Freq: Two times a day (BID) | ORAL | 0 refills | Status: DC
  Filled 2023-12-27: qty 14, 7d supply, fill #0

## 2023-12-27 MED ORDER — DOXYCYCLINE HYCLATE 100 MG PO TABS
100.0000 mg | ORAL_TABLET | Freq: Two times a day (BID) | ORAL | 0 refills | Status: DC
  Filled 2023-12-27: qty 14, 7d supply, fill #0

## 2023-12-27 MED ORDER — PROMETHAZINE-DM 6.25-15 MG/5ML PO SYRP
5.0000 mL | ORAL_SOLUTION | Freq: Every day | ORAL | 0 refills | Status: DC
  Filled 2023-12-27 (×2): qty 25, 5d supply, fill #0

## 2023-12-27 MED ORDER — PROMETHAZINE-DM 6.25-15 MG/5ML PO SYRP
5.0000 mL | ORAL_SOLUTION | ORAL | 0 refills | Status: DC | PRN
  Filled 2023-12-27: qty 150, 5d supply, fill #0

## 2023-12-27 MED ORDER — AZELASTINE HCL 0.1 % NA SOLN
2.0000 | Freq: Two times a day (BID) | NASAL | 0 refills | Status: DC
  Filled 2023-12-27: qty 30, 50d supply, fill #0

## 2023-12-27 MED ORDER — CLARITHROMYCIN 500 MG PO TABS
500.0000 mg | ORAL_TABLET | Freq: Two times a day (BID) | ORAL | 0 refills | Status: DC
  Filled 2023-12-27: qty 14, 7d supply, fill #0

## 2023-12-28 ENCOUNTER — Other Ambulatory Visit (HOSPITAL_COMMUNITY): Payer: Self-pay

## 2024-01-01 ENCOUNTER — Inpatient Hospital Stay: Payer: Medicare Other | Admitting: Hematology and Oncology

## 2024-01-01 ENCOUNTER — Other Ambulatory Visit (HOSPITAL_COMMUNITY): Payer: Self-pay

## 2024-01-01 DIAGNOSIS — J189 Pneumonia, unspecified organism: Secondary | ICD-10-CM | POA: Diagnosis not present

## 2024-01-01 DIAGNOSIS — R509 Fever, unspecified: Secondary | ICD-10-CM | POA: Diagnosis not present

## 2024-01-01 DIAGNOSIS — J45901 Unspecified asthma with (acute) exacerbation: Secondary | ICD-10-CM | POA: Diagnosis not present

## 2024-01-01 DIAGNOSIS — R051 Acute cough: Secondary | ICD-10-CM | POA: Diagnosis not present

## 2024-01-01 MED ORDER — FLUCONAZOLE 150 MG PO TABS
ORAL_TABLET | ORAL | 0 refills | Status: DC
  Filled 2024-01-01: qty 4, 4d supply, fill #0

## 2024-01-01 MED ORDER — AMOXICILLIN-POT CLAVULANATE 875-125 MG PO TABS
1.0000 | ORAL_TABLET | Freq: Two times a day (BID) | ORAL | 0 refills | Status: DC
  Filled 2024-01-01: qty 20, 10d supply, fill #0

## 2024-01-01 MED ORDER — AZITHROMYCIN 250 MG PO TABS
ORAL_TABLET | ORAL | 0 refills | Status: DC
  Filled 2024-01-01: qty 6, 5d supply, fill #0

## 2024-01-01 MED ORDER — PREDNISONE 10 MG PO TABS
ORAL_TABLET | ORAL | 0 refills | Status: DC
  Filled 2024-01-01: qty 20, 8d supply, fill #0

## 2024-01-04 ENCOUNTER — Other Ambulatory Visit (HOSPITAL_COMMUNITY): Payer: Self-pay

## 2024-01-05 ENCOUNTER — Other Ambulatory Visit: Payer: Self-pay

## 2024-01-06 ENCOUNTER — Other Ambulatory Visit (HOSPITAL_COMMUNITY): Payer: Self-pay

## 2024-01-08 ENCOUNTER — Other Ambulatory Visit (HOSPITAL_COMMUNITY): Payer: Self-pay

## 2024-01-08 ENCOUNTER — Other Ambulatory Visit: Payer: Self-pay | Admitting: Hematology and Oncology

## 2024-01-08 ENCOUNTER — Encounter: Payer: Self-pay | Admitting: Hematology and Oncology

## 2024-01-08 ENCOUNTER — Other Ambulatory Visit: Payer: Self-pay

## 2024-01-08 DIAGNOSIS — R0789 Other chest pain: Secondary | ICD-10-CM

## 2024-01-08 DIAGNOSIS — R4702 Dysphasia: Secondary | ICD-10-CM

## 2024-01-08 DIAGNOSIS — R519 Headache, unspecified: Secondary | ICD-10-CM

## 2024-01-08 DIAGNOSIS — C711 Malignant neoplasm of frontal lobe: Secondary | ICD-10-CM

## 2024-01-08 DIAGNOSIS — R110 Nausea: Secondary | ICD-10-CM

## 2024-01-08 DIAGNOSIS — R202 Paresthesia of skin: Secondary | ICD-10-CM

## 2024-01-08 MED ORDER — CYCLOBENZAPRINE HCL 10 MG PO TABS
10.0000 mg | ORAL_TABLET | Freq: Three times a day (TID) | ORAL | 1 refills | Status: DC | PRN
  Filled 2024-01-08: qty 30, 10d supply, fill #0
  Filled 2024-01-23: qty 30, 10d supply, fill #1

## 2024-01-08 MED ORDER — BUTALBITAL-APAP-CAFFEINE 50-325-40 MG PO TABS
1.0000 | ORAL_TABLET | Freq: Two times a day (BID) | ORAL | 0 refills | Status: DC | PRN
  Filled 2024-01-08: qty 60, 30d supply, fill #0

## 2024-01-08 MED ORDER — CARISOPRODOL 350 MG PO TABS
350.0000 mg | ORAL_TABLET | Freq: Three times a day (TID) | ORAL | 0 refills | Status: DC
Start: 2024-01-08 — End: 2024-04-01
  Filled 2024-01-08: qty 270, 90d supply, fill #0

## 2024-01-09 ENCOUNTER — Telehealth: Payer: Self-pay | Admitting: Hematology and Oncology

## 2024-01-09 NOTE — Telephone Encounter (Signed)
 Scheduled appointment per patients request. Talked with the patient and she is aware of the made appointment.

## 2024-01-16 ENCOUNTER — Other Ambulatory Visit (HOSPITAL_COMMUNITY): Payer: Self-pay

## 2024-01-16 ENCOUNTER — Telehealth: Payer: Self-pay

## 2024-01-16 ENCOUNTER — Other Ambulatory Visit: Payer: Self-pay | Admitting: Hematology and Oncology

## 2024-01-16 DIAGNOSIS — C719 Malignant neoplasm of brain, unspecified: Secondary | ICD-10-CM

## 2024-01-16 MED ORDER — HYDROCODONE-ACETAMINOPHEN 10-325 MG PO TABS
1.0000 | ORAL_TABLET | Freq: Three times a day (TID) | ORAL | 0 refills | Status: DC | PRN
  Filled 2024-01-16: qty 90, 30d supply, fill #0

## 2024-01-16 NOTE — Telephone Encounter (Signed)
 Called and rescheduled appt to 2 pm on 7/29 for 45 mins. She is aware of appt date/time.

## 2024-01-23 ENCOUNTER — Other Ambulatory Visit: Payer: Self-pay | Admitting: Neurology

## 2024-01-23 DIAGNOSIS — G43719 Chronic migraine without aura, intractable, without status migrainosus: Secondary | ICD-10-CM

## 2024-01-23 DIAGNOSIS — G40109 Localization-related (focal) (partial) symptomatic epilepsy and epileptic syndromes with simple partial seizures, not intractable, without status epilepticus: Secondary | ICD-10-CM

## 2024-01-24 ENCOUNTER — Other Ambulatory Visit: Payer: Self-pay

## 2024-01-24 ENCOUNTER — Other Ambulatory Visit (HOSPITAL_COMMUNITY): Payer: Self-pay

## 2024-01-24 MED ORDER — ZOLMITRIPTAN 5 MG PO TABS
ORAL_TABLET | ORAL | 0 refills | Status: DC
  Filled 2024-01-24: qty 1, 1d supply, fill #0
  Filled 2024-01-25: qty 9, 24d supply, fill #0
  Filled 2024-02-09: qty 10, 30d supply, fill #0

## 2024-01-24 MED ORDER — DIVALPROEX SODIUM ER 250 MG PO TB24
500.0000 mg | ORAL_TABLET | Freq: Three times a day (TID) | ORAL | 0 refills | Status: DC
Start: 2024-01-24 — End: 2024-02-23
  Filled 2024-01-24: qty 180, 30d supply, fill #0

## 2024-01-24 MED ORDER — GABAPENTIN 300 MG PO CAPS
ORAL_CAPSULE | ORAL | 0 refills | Status: DC
  Filled 2024-01-24: qty 300, 30d supply, fill #0

## 2024-01-25 ENCOUNTER — Other Ambulatory Visit (HOSPITAL_COMMUNITY): Payer: Self-pay

## 2024-02-02 ENCOUNTER — Other Ambulatory Visit: Payer: Self-pay | Admitting: Hematology and Oncology

## 2024-02-02 ENCOUNTER — Other Ambulatory Visit (HOSPITAL_COMMUNITY): Payer: Self-pay

## 2024-02-02 MED ORDER — BUTALBITAL-APAP-CAFFEINE 50-325-40 MG PO TABS
1.0000 | ORAL_TABLET | Freq: Two times a day (BID) | ORAL | 0 refills | Status: DC | PRN
  Filled 2024-02-02 – 2024-02-05 (×2): qty 60, 30d supply, fill #0

## 2024-02-04 MED ORDER — CYCLOBENZAPRINE HCL 10 MG PO TABS
10.0000 mg | ORAL_TABLET | Freq: Three times a day (TID) | ORAL | 1 refills | Status: DC | PRN
  Filled 2024-02-04: qty 30, 10d supply, fill #0
  Filled 2024-02-15: qty 30, 10d supply, fill #1

## 2024-02-05 ENCOUNTER — Other Ambulatory Visit (HOSPITAL_COMMUNITY): Payer: Self-pay

## 2024-02-09 ENCOUNTER — Other Ambulatory Visit: Payer: Self-pay | Admitting: Hematology and Oncology

## 2024-02-09 ENCOUNTER — Other Ambulatory Visit (HOSPITAL_COMMUNITY): Payer: Self-pay

## 2024-02-09 DIAGNOSIS — C719 Malignant neoplasm of brain, unspecified: Secondary | ICD-10-CM

## 2024-02-09 DIAGNOSIS — C711 Malignant neoplasm of frontal lobe: Secondary | ICD-10-CM | POA: Diagnosis not present

## 2024-02-12 ENCOUNTER — Other Ambulatory Visit: Payer: Self-pay

## 2024-02-12 ENCOUNTER — Other Ambulatory Visit: Payer: Self-pay | Admitting: Hematology and Oncology

## 2024-02-12 ENCOUNTER — Encounter (HOSPITAL_COMMUNITY): Payer: Self-pay

## 2024-02-12 ENCOUNTER — Other Ambulatory Visit (HOSPITAL_COMMUNITY): Payer: Self-pay

## 2024-02-12 DIAGNOSIS — C719 Malignant neoplasm of brain, unspecified: Secondary | ICD-10-CM

## 2024-02-13 ENCOUNTER — Telehealth: Payer: Self-pay

## 2024-02-13 ENCOUNTER — Other Ambulatory Visit (HOSPITAL_COMMUNITY): Payer: Self-pay

## 2024-02-13 ENCOUNTER — Encounter: Payer: Self-pay | Admitting: Hematology and Oncology

## 2024-02-13 ENCOUNTER — Inpatient Hospital Stay: Attending: Hematology and Oncology | Admitting: Hematology and Oncology

## 2024-02-13 ENCOUNTER — Ambulatory Visit: Admitting: Hematology and Oncology

## 2024-02-13 DIAGNOSIS — C711 Malignant neoplasm of frontal lobe: Secondary | ICD-10-CM | POA: Diagnosis not present

## 2024-02-13 DIAGNOSIS — G40109 Localization-related (focal) (partial) symptomatic epilepsy and epileptic syndromes with simple partial seizures, not intractable, without status epilepticus: Secondary | ICD-10-CM | POA: Insufficient documentation

## 2024-02-13 DIAGNOSIS — Z79899 Other long term (current) drug therapy: Secondary | ICD-10-CM | POA: Insufficient documentation

## 2024-02-13 DIAGNOSIS — G629 Polyneuropathy, unspecified: Secondary | ICD-10-CM | POA: Insufficient documentation

## 2024-02-13 DIAGNOSIS — R519 Headache, unspecified: Secondary | ICD-10-CM | POA: Diagnosis not present

## 2024-02-13 DIAGNOSIS — M792 Neuralgia and neuritis, unspecified: Secondary | ICD-10-CM

## 2024-02-13 MED ORDER — HYDROCODONE-ACETAMINOPHEN 10-325 MG PO TABS
1.0000 | ORAL_TABLET | Freq: Three times a day (TID) | ORAL | 0 refills | Status: DC | PRN
  Filled 2024-02-13: qty 90, 30d supply, fill #0

## 2024-02-13 NOTE — Assessment & Plan Note (Signed)
 She has complex seizure disorders She is reluctant to come off Soma  and lorazepam  She has not used lorazepam  for a while I am not comfortable prescribing Soma  long-term and I informed the patient to consider discussing this with her neurologist to take over her prescription long-term After long discussion, she is in agreement for me to discontinue lorazepam  If she feels that she needs lorazepam  again, I recommend she gets her prescription filled through her primary care doctor or neurologist

## 2024-02-13 NOTE — Assessment & Plan Note (Signed)
 She has chronic headache which is multifactorial, related to prior resection with neuropathic pain component Ultimately, for now, she will continue gabapentin , Fioricet , Soma  and hydrocodone  for now

## 2024-02-13 NOTE — Assessment & Plan Note (Signed)
 We have problems with polypharmacy in the past Over the years, I was able to successfully reduce the amount of medications that she is taking for her chronic headaches For now, we will keep her Soma  at current dose We will continue hydrocodone  at current dose She will continue treatment with gabapentin  as directed by her neurologist I will continue to refill her Fioricet  to twice daily As above, we will taper her off lorazepam  and I would not refill future prescription

## 2024-02-13 NOTE — Assessment & Plan Note (Signed)
 I am not able to review documentation from [facility] I will defer to them for management of her brain tumor

## 2024-02-13 NOTE — Progress Notes (Signed)
 HEMATOLOGY-ONCOLOGY ELECTRONIC VISIT PROGRESS NOTE  Patient Care Team: Cleotilde Cynthia BRAVO, PA as PCP - General (Internal Medicine) Dann Candyce RAMAN, MD as PCP - Cardiology (Cardiology) Gillie Duncans, MD as Consulting Physician (Neurosurgery) Nancye Garnette NOVAK., MD (Inactive) as Consulting Physician (Neurosurgery) Nancye Garnette NOVAK., MD (Inactive) as Consulting Physician (Neurosurgery) Bobbitt, Elgin Pepper, MD as Consulting Physician (Allergy and Immunology) Georjean Darice HERO, MD as Consulting Physician (Neurology)  I connected with the patient via telephone conference and verified that I am speaking with the correct person using two identifiers. The patient's location is at home and I am providing care from the [facility] I discussed the limitations, risks, security and privacy concerns of performing an evaluation and management service by e-visits and the availability of in person appointments.  I also discussed with the patient that there may be a patient responsible charge related to this service. The patient expressed understanding and agreed to proceed.   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 She is reluctant to come off Soma  and lorazepam  She has not used lorazepam  for a while I am not comfortable prescribing Soma  long-term and I informed the patient to consider discussing this with her neurologist to take over her prescription long-term After long discussion, she is in agreement for me to discontinue lorazepam  If she feels that she needs lorazepam  again, I recommend she gets her prescription filled through her primary care doctor or neurologist  Polypharmacy We have problems with polypharmacy in the past Over the  years, I was able to successfully reduce the amount of medications that she is taking for her chronic headaches For now, we will keep her Soma  at current dose We will continue hydrocodone  at current dose She will continue treatment with gabapentin  as directed by her neurologist I will continue to refill her Fioricet  to twice daily As above, we will taper her off lorazepam  and I would not refill future prescription  Neuropathic pain She has chronic headache which is multifactorial, related to prior resection with neuropathic pain component Ultimately, for now, she will continue gabapentin , Fioricet , Soma  and hydrocodone  for now  No orders of the defined types were placed in this encounter.   INTERVAL HISTORY: Please see below for problem oriented charting. 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   09/27/2013 Pathology Results   Accession: [id number] pathology showed grade 2 oligodendroglioma with mutant IDH1 protein, ATRX, OLIG 2 expression. p53 is negative, low Ki-67 labeling index. Molecular SNP array studies demonstrate whole arm 1p19q codeletion.   10/28/2013 Imaging   Ct scan of head showed possible abscess.   11/06/2013 Imaging   Repeat CT scan of the head showed resolution of abscess.   01/23/2014 Surgery   Dr. Gillie perform a subtotal gross resection of the tumor.   01/24/2014 Imaging   Repeat imaging studies showed persistent and residual disease.  03/03/2014 - 09/01/2014 Chemotherapy   She was started on Temodar , 5 days every 28 days   03/13/2014  Imaging   Repeat MRI showed no evidence of progression of disease.   07/14/2014 Imaging   Repeat MRI showed no evidence of disease progression.   09/12/2014 Imaging   MRI of the liver show mild regression of the size of lesions, presumed to be benign   09/15/2014 Imaging   Repeat MRI of the brain show persistent abnormalities, suspicious for residual disease   03/02/2015 Imaging    MRI brain at [facility] show persistent abnormalities   08/10/2015 Imaging   Repeat MRI [facility] showed stable disease   08/24/2015 - 02/08/2016 Chemotherapy   She was started on Lomustine , Procarbazine and Vincristine  chemotherapy   08/30/2015 Imaging   CT chest showed no evidence of aspiration pneumonia   11/18/2015 Imaging   MRI brain showed status post resection of right frontal lobe tumor without residual or recurrent tumor. 2. Otherwise negative MRI of the brain.    03/02/2016 Imaging   Stable MRI post right frontal lobe tumor resection. Negative for recurrent tumor   06/01/2016 Imaging   Stable MRI post right frontal lobe tumor resection. No recurrent tumor identified   08/19/2016 Imaging   Unchanged examination without tumor recurrence.   11/17/2016 Imaging   Stable right frontal resection cavity. Stable focal mass effect within the right middle frontal gyrus at the superior margin of the cavity which may represent nonenhancing neoplasm. No evidence for tumor progression. Otherwise stable MRI of the brain   12/16/2016 Imaging   MR brain: 1. No acute intracranial abnormality. 2. Unchanged appearance of right frontal lobe resection cavity and surrounding hyperintense T2 weighted signal.   02/04/2017 Imaging   Repeat MRI Normal pituitary   Stable postop changes of right frontal tumor resection   05/12/2017 Imaging   The left ventricular size is normal. There is normal left ventricular wall thickness.  Left ventricular systolic function is normal. LV ejection fraction = 60-65%.  Left ventricular filling  pattern is normal. The left ventricular wall motion is normal. The right ventricle is normal in size and function. There is no significant valvular stenosis or regurgitation No pulmonary hypertension. IVC size was normal. There is no pericardial effusion. There is no comparison study available   05/13/2017 Imaging   1.  No evidence of acute pulmonary thromboembolic disease. 2.  There is an 8 mm right lower lobe groundglass nodule. Statistically, in this age group, this is most likely to reflect benign etiologies such as a tiny focus of infection or nonspecific inflammation. 4 mm solid right upper lobe lung nodule. A CT scan could be obtained in 12 months to further evaluate if the patient has any known risk factors for malignancy. 3.  There is a 1.3 cm left adrenal nodule. This finding is incompletely characterized although and is slightly increased in size relative to the abdominal MRI from February 2016. Although this finding is most likely benign, recommend follow-up adrenal protocol CT in one year.   05/13/2017 Imaging   MRI brain 1.  No acute intracranial abnormality. 2.  Postsurgical changes of a right frontal craniotomy for resection of a reported oligodendroglioma. Hyperintense T2/FLAIR signal around the resection cavity and in the subcortical white matter of a gyrus along the superior margin of the cavity is unchanged from 01/05/2015. No evidence of disease progression.   12/15/2017 Imaging   MRI brain at [facility] 1.  No acute intracranial abnormality. 2.  Postsurgical changes  of right frontal craniotomy for resection of reported oligodendroglioma. No evidence of disease progression   12/28/2017 Procedure   Successful right IJ vein Port-A-Cath explant.   04/24/2018 Imaging   Stable post treatment appearance, RIGHT frontal oligodendroglioma.  No posttraumatic sequelae are evident.   01/30/2019 Imaging   Stable appearance of treated right frontal oligodendroglioma. No change  from 04/24/2018.   11/18/2019 Imaging   1. Stable postoperative changes of the right frontal lobe without evidence for residual or recurrent disease. 2. No acute intracranial abnormality or significant interval change.   03/18/2022 Imaging   1.  Stable exam from 07/28/2021. Compared to more remote prior exam from 2018, there is an area of T2 hyperintense signal in the white matter deep to the resection cavity that has mildly increased in extent. Recommend close attention to this region on follow-up; differential considerations for this finding include evolving posttreatment changes and slowly growing tumor.  2.  No acute intracranial abnormality.    12/2022 -  Chemotherapy   She started taking ivosidenib   05/26/2023 Imaging   Stable post resection, posttreatment changes of right frontal lobe tumor resection. There is no evidence of disease progression. T2 hyperintense signal in the right frontal lobe adjacent to the resection cavity is stable consistent with chronic gliotic changes although small volume of stable nonenhancing tumor cannot be excluded.     I discussed the assessment and treatment plan with the patient. The patient was provided an opportunity to ask questions and all were answered. The patient agreed with the plan and demonstrated an understanding of the instructions. The patient was advised to call back or seek an in-person evaluation if the symptoms worsen or if the condition fails to improve as anticipated.    I spent 30 minutes for the appointment reviewing test results, discuss management and coordination of care.  Almarie Bedford, MD 02/13/2024 3:06 PM

## 2024-02-13 NOTE — Telephone Encounter (Signed)
 Per Dr Lonn, pt's appt today was changed to a telephone visit. She is agreeable to this and verbalized the need for a refill on her Norco. Advised pt to discuss this with MD during her appt.

## 2024-02-23 ENCOUNTER — Other Ambulatory Visit: Payer: Self-pay

## 2024-02-23 ENCOUNTER — Other Ambulatory Visit: Payer: Self-pay | Admitting: Neurology

## 2024-02-23 ENCOUNTER — Other Ambulatory Visit (HOSPITAL_COMMUNITY): Payer: Self-pay

## 2024-02-23 DIAGNOSIS — G40109 Localization-related (focal) (partial) symptomatic epilepsy and epileptic syndromes with simple partial seizures, not intractable, without status epilepticus: Secondary | ICD-10-CM

## 2024-02-23 DIAGNOSIS — G43719 Chronic migraine without aura, intractable, without status migrainosus: Secondary | ICD-10-CM

## 2024-02-23 MED ORDER — CYCLOBENZAPRINE HCL 10 MG PO TABS
10.0000 mg | ORAL_TABLET | Freq: Three times a day (TID) | ORAL | 1 refills | Status: DC | PRN
  Filled 2024-02-23: qty 30, 10d supply, fill #0
  Filled 2024-03-04 (×2): qty 30, 10d supply, fill #1

## 2024-02-23 MED ORDER — GABAPENTIN 300 MG PO CAPS
ORAL_CAPSULE | ORAL | 0 refills | Status: DC
  Filled 2024-02-23: qty 300, 30d supply, fill #0

## 2024-02-23 MED ORDER — DIVALPROEX SODIUM ER 250 MG PO TB24
500.0000 mg | ORAL_TABLET | Freq: Three times a day (TID) | ORAL | 0 refills | Status: DC
  Filled 2024-02-23: qty 180, 30d supply, fill #0

## 2024-02-23 NOTE — Telephone Encounter (Signed)
 Please advise. Last OV 12/27/22

## 2024-02-26 ENCOUNTER — Other Ambulatory Visit (HOSPITAL_COMMUNITY): Payer: Self-pay

## 2024-02-26 MED ORDER — PROMETHAZINE HCL 12.5 MG PO TABS
12.5000 mg | ORAL_TABLET | Freq: Three times a day (TID) | ORAL | 1 refills | Status: DC
  Filled 2024-02-26: qty 90, 30d supply, fill #0
  Filled 2024-03-26: qty 90, 30d supply, fill #1

## 2024-03-04 ENCOUNTER — Other Ambulatory Visit (HOSPITAL_COMMUNITY): Payer: Self-pay

## 2024-03-04 ENCOUNTER — Other Ambulatory Visit: Payer: Self-pay

## 2024-03-04 ENCOUNTER — Other Ambulatory Visit: Payer: Self-pay | Admitting: Hematology and Oncology

## 2024-03-05 ENCOUNTER — Other Ambulatory Visit (HOSPITAL_COMMUNITY): Payer: Self-pay

## 2024-03-05 MED ORDER — BUTALBITAL-APAP-CAFFEINE 50-325-40 MG PO TABS
1.0000 | ORAL_TABLET | Freq: Two times a day (BID) | ORAL | 0 refills | Status: DC | PRN
  Filled 2024-03-05: qty 60, 30d supply, fill #0

## 2024-03-11 ENCOUNTER — Other Ambulatory Visit (HOSPITAL_COMMUNITY): Payer: Self-pay

## 2024-03-11 ENCOUNTER — Other Ambulatory Visit: Payer: Self-pay | Admitting: Hematology and Oncology

## 2024-03-11 ENCOUNTER — Other Ambulatory Visit: Payer: Self-pay | Admitting: Neurology

## 2024-03-11 DIAGNOSIS — G43719 Chronic migraine without aura, intractable, without status migrainosus: Secondary | ICD-10-CM

## 2024-03-11 DIAGNOSIS — C711 Malignant neoplasm of frontal lobe: Secondary | ICD-10-CM | POA: Diagnosis not present

## 2024-03-11 DIAGNOSIS — C719 Malignant neoplasm of brain, unspecified: Secondary | ICD-10-CM

## 2024-03-11 MED ORDER — HYDROCODONE-ACETAMINOPHEN 10-325 MG PO TABS
1.0000 | ORAL_TABLET | Freq: Three times a day (TID) | ORAL | 0 refills | Status: DC | PRN
  Filled 2024-03-11 – 2024-03-12 (×3): qty 90, 30d supply, fill #0

## 2024-03-11 MED ORDER — CYCLOBENZAPRINE HCL 10 MG PO TABS
10.0000 mg | ORAL_TABLET | Freq: Three times a day (TID) | ORAL | 1 refills | Status: DC | PRN
  Filled 2024-03-11: qty 30, 10d supply, fill #0
  Filled 2024-03-18: qty 30, 10d supply, fill #1

## 2024-03-11 MED ORDER — ZOLMITRIPTAN 5 MG PO TABS
ORAL_TABLET | ORAL | 11 refills | Status: AC
  Filled 2024-03-11: qty 1, 1d supply, fill #0
  Filled 2024-03-12: qty 7, 21d supply, fill #0
  Filled 2024-03-12: qty 3, 7d supply, fill #0
  Filled 2024-03-12: qty 9, 27d supply, fill #0
  Filled 2024-04-23: qty 10, 28d supply, fill #1
  Filled 2024-06-07: qty 10, 28d supply, fill #2
  Filled 2024-06-11: qty 9, 25d supply, fill #2
  Filled 2024-08-08 – 2024-08-09 (×2): qty 9, 25d supply, fill #3

## 2024-03-12 ENCOUNTER — Other Ambulatory Visit (HOSPITAL_COMMUNITY): Payer: Self-pay

## 2024-03-18 ENCOUNTER — Other Ambulatory Visit: Payer: Self-pay | Admitting: Neurology

## 2024-03-18 DIAGNOSIS — G40109 Localization-related (focal) (partial) symptomatic epilepsy and epileptic syndromes with simple partial seizures, not intractable, without status epilepticus: Secondary | ICD-10-CM

## 2024-03-18 DIAGNOSIS — G43719 Chronic migraine without aura, intractable, without status migrainosus: Secondary | ICD-10-CM

## 2024-03-19 ENCOUNTER — Other Ambulatory Visit (HOSPITAL_COMMUNITY): Payer: Self-pay

## 2024-03-19 ENCOUNTER — Other Ambulatory Visit: Payer: Self-pay

## 2024-03-19 MED ORDER — GABAPENTIN 300 MG PO CAPS
ORAL_CAPSULE | ORAL | 6 refills | Status: AC
  Filled 2024-03-19: qty 300, 30d supply, fill #0
  Filled 2024-04-23: qty 300, 30d supply, fill #1
  Filled 2024-06-07 – 2024-06-11 (×2): qty 300, 30d supply, fill #2
  Filled 2024-07-16: qty 300, 30d supply, fill #3
  Filled 2024-08-14: qty 300, 30d supply, fill #4

## 2024-03-19 MED ORDER — DIVALPROEX SODIUM ER 250 MG PO TB24
500.0000 mg | ORAL_TABLET | Freq: Three times a day (TID) | ORAL | 6 refills | Status: AC
  Filled 2024-03-19: qty 180, 30d supply, fill #0
  Filled 2024-04-23: qty 180, 30d supply, fill #1
  Filled 2024-06-07 – 2024-06-11 (×2): qty 180, 30d supply, fill #2
  Filled 2024-07-16: qty 180, 30d supply, fill #3
  Filled 2024-08-14: qty 180, 30d supply, fill #4

## 2024-03-26 ENCOUNTER — Other Ambulatory Visit (HOSPITAL_COMMUNITY): Payer: Self-pay

## 2024-03-26 ENCOUNTER — Other Ambulatory Visit: Payer: Self-pay

## 2024-03-26 DIAGNOSIS — C711 Malignant neoplasm of frontal lobe: Secondary | ICD-10-CM | POA: Diagnosis not present

## 2024-03-26 MED ORDER — EPINEPHRINE 0.3 MG/0.3ML IJ SOAJ
0.3000 mg | INTRAMUSCULAR | 1 refills | Status: AC | PRN
  Filled 2024-03-26 (×2): qty 2, 7d supply, fill #0
  Filled 2024-07-16: qty 2, 7d supply, fill #1

## 2024-03-26 MED ORDER — CYCLOBENZAPRINE HCL 10 MG PO TABS
10.0000 mg | ORAL_TABLET | Freq: Three times a day (TID) | ORAL | 1 refills | Status: DC | PRN
  Filled 2024-03-26: qty 30, 10d supply, fill #0
  Filled 2024-04-04: qty 30, 10d supply, fill #1

## 2024-04-01 ENCOUNTER — Other Ambulatory Visit: Payer: Self-pay | Admitting: Hematology and Oncology

## 2024-04-01 ENCOUNTER — Other Ambulatory Visit (HOSPITAL_COMMUNITY): Payer: Self-pay

## 2024-04-01 ENCOUNTER — Other Ambulatory Visit: Payer: Self-pay

## 2024-04-01 DIAGNOSIS — R0789 Other chest pain: Secondary | ICD-10-CM

## 2024-04-01 DIAGNOSIS — R4702 Dysphasia: Secondary | ICD-10-CM

## 2024-04-01 DIAGNOSIS — R202 Paresthesia of skin: Secondary | ICD-10-CM

## 2024-04-01 DIAGNOSIS — R110 Nausea: Secondary | ICD-10-CM

## 2024-04-01 DIAGNOSIS — R519 Headache, unspecified: Secondary | ICD-10-CM

## 2024-04-01 DIAGNOSIS — C711 Malignant neoplasm of frontal lobe: Secondary | ICD-10-CM

## 2024-04-01 MED ORDER — CARISOPRODOL 350 MG PO TABS
350.0000 mg | ORAL_TABLET | Freq: Three times a day (TID) | ORAL | 0 refills | Status: DC
  Filled 2024-04-01: qty 270, 90d supply, fill #0
  Filled 2024-04-05: qty 255, 85d supply, fill #0
  Filled 2024-04-05: qty 15, 5d supply, fill #0

## 2024-04-01 MED ORDER — BUTALBITAL-APAP-CAFFEINE 50-325-40 MG PO TABS
1.0000 | ORAL_TABLET | Freq: Two times a day (BID) | ORAL | 0 refills | Status: DC | PRN
  Filled 2024-04-01 (×2): qty 60, 30d supply, fill #0

## 2024-04-03 DIAGNOSIS — C711 Malignant neoplasm of frontal lobe: Secondary | ICD-10-CM | POA: Diagnosis not present

## 2024-04-05 ENCOUNTER — Other Ambulatory Visit: Payer: Self-pay

## 2024-04-05 ENCOUNTER — Other Ambulatory Visit (HOSPITAL_COMMUNITY): Payer: Self-pay

## 2024-04-08 ENCOUNTER — Other Ambulatory Visit (HOSPITAL_COMMUNITY): Payer: Self-pay

## 2024-04-10 ENCOUNTER — Other Ambulatory Visit: Payer: Self-pay | Admitting: Hematology and Oncology

## 2024-04-10 ENCOUNTER — Other Ambulatory Visit (HOSPITAL_COMMUNITY): Payer: Self-pay

## 2024-04-10 DIAGNOSIS — C719 Malignant neoplasm of brain, unspecified: Secondary | ICD-10-CM

## 2024-04-11 ENCOUNTER — Other Ambulatory Visit (HOSPITAL_COMMUNITY): Payer: Self-pay

## 2024-04-11 MED ORDER — HYDROCODONE-ACETAMINOPHEN 10-325 MG PO TABS
1.0000 | ORAL_TABLET | Freq: Three times a day (TID) | ORAL | 0 refills | Status: DC | PRN
  Filled 2024-04-11: qty 90, 30d supply, fill #0

## 2024-04-15 ENCOUNTER — Other Ambulatory Visit (HOSPITAL_COMMUNITY): Payer: Self-pay

## 2024-04-16 ENCOUNTER — Other Ambulatory Visit (HOSPITAL_COMMUNITY): Payer: Self-pay

## 2024-04-16 MED ORDER — CYCLOBENZAPRINE HCL 10 MG PO TABS
10.0000 mg | ORAL_TABLET | Freq: Three times a day (TID) | ORAL | 1 refills | Status: DC | PRN
  Filled 2024-04-16: qty 30, 10d supply, fill #0
  Filled 2024-04-24: qty 30, 10d supply, fill #1

## 2024-04-23 ENCOUNTER — Other Ambulatory Visit (HOSPITAL_COMMUNITY): Payer: Self-pay

## 2024-04-24 ENCOUNTER — Other Ambulatory Visit: Payer: Self-pay | Admitting: Hematology and Oncology

## 2024-04-24 ENCOUNTER — Other Ambulatory Visit: Payer: Self-pay

## 2024-04-24 ENCOUNTER — Other Ambulatory Visit (HOSPITAL_COMMUNITY): Payer: Self-pay

## 2024-04-24 MED ORDER — PROMETHAZINE HCL 12.5 MG PO TABS
12.5000 mg | ORAL_TABLET | Freq: Three times a day (TID) | ORAL | 1 refills | Status: AC | PRN
  Filled 2024-04-24: qty 90, 30d supply, fill #0
  Filled 2024-07-16: qty 90, 30d supply, fill #1

## 2024-04-25 ENCOUNTER — Other Ambulatory Visit (HOSPITAL_COMMUNITY): Payer: Self-pay

## 2024-04-26 DIAGNOSIS — C711 Malignant neoplasm of frontal lobe: Secondary | ICD-10-CM | POA: Diagnosis not present

## 2024-04-29 ENCOUNTER — Other Ambulatory Visit (HOSPITAL_COMMUNITY): Payer: Self-pay

## 2024-04-30 ENCOUNTER — Other Ambulatory Visit (HOSPITAL_COMMUNITY): Payer: Self-pay

## 2024-04-30 ENCOUNTER — Other Ambulatory Visit: Payer: Self-pay | Admitting: Hematology and Oncology

## 2024-04-30 MED ORDER — BUTALBITAL-APAP-CAFFEINE 50-325-40 MG PO TABS
1.0000 | ORAL_TABLET | Freq: Two times a day (BID) | ORAL | 0 refills | Status: DC | PRN
  Filled 2024-04-30: qty 60, 30d supply, fill #0

## 2024-05-01 ENCOUNTER — Other Ambulatory Visit (HOSPITAL_COMMUNITY): Payer: Self-pay

## 2024-05-02 ENCOUNTER — Other Ambulatory Visit: Payer: Self-pay

## 2024-05-06 ENCOUNTER — Other Ambulatory Visit (HOSPITAL_COMMUNITY): Payer: Self-pay

## 2024-05-06 ENCOUNTER — Other Ambulatory Visit: Payer: Self-pay | Admitting: Hematology and Oncology

## 2024-05-06 DIAGNOSIS — C719 Malignant neoplasm of brain, unspecified: Secondary | ICD-10-CM

## 2024-05-06 MED ORDER — CYCLOBENZAPRINE HCL 10 MG PO TABS
10.0000 mg | ORAL_TABLET | Freq: Three times a day (TID) | ORAL | 1 refills | Status: DC | PRN
  Filled 2024-05-06: qty 30, 10d supply, fill #0
  Filled 2024-05-14 (×2): qty 30, 10d supply, fill #1

## 2024-05-09 ENCOUNTER — Other Ambulatory Visit (HOSPITAL_COMMUNITY): Payer: Self-pay

## 2024-05-09 ENCOUNTER — Other Ambulatory Visit: Payer: Self-pay | Admitting: Hematology and Oncology

## 2024-05-09 DIAGNOSIS — C719 Malignant neoplasm of brain, unspecified: Secondary | ICD-10-CM

## 2024-05-10 ENCOUNTER — Other Ambulatory Visit: Payer: Self-pay

## 2024-05-10 MED ORDER — HYDROCODONE-ACETAMINOPHEN 10-325 MG PO TABS
1.0000 | ORAL_TABLET | Freq: Three times a day (TID) | ORAL | 0 refills | Status: DC | PRN
Start: 2024-05-10 — End: 2024-06-07
  Filled 2024-05-10: qty 90, 30d supply, fill #0

## 2024-05-14 ENCOUNTER — Other Ambulatory Visit (HOSPITAL_COMMUNITY): Payer: Self-pay

## 2024-05-14 ENCOUNTER — Other Ambulatory Visit: Payer: Self-pay

## 2024-05-22 ENCOUNTER — Other Ambulatory Visit (HOSPITAL_COMMUNITY): Payer: Self-pay

## 2024-05-23 ENCOUNTER — Other Ambulatory Visit (HOSPITAL_COMMUNITY): Payer: Self-pay

## 2024-05-23 MED ORDER — CYCLOBENZAPRINE HCL 10 MG PO TABS
10.0000 mg | ORAL_TABLET | Freq: Three times a day (TID) | ORAL | 1 refills | Status: DC | PRN
  Filled 2024-05-23: qty 30, 10d supply, fill #0
  Filled 2024-06-02: qty 30, 10d supply, fill #1

## 2024-05-26 ENCOUNTER — Other Ambulatory Visit: Payer: Self-pay | Admitting: Hematology and Oncology

## 2024-05-28 ENCOUNTER — Other Ambulatory Visit: Payer: Self-pay | Admitting: Hematology and Oncology

## 2024-05-28 ENCOUNTER — Other Ambulatory Visit (HOSPITAL_COMMUNITY): Payer: Self-pay

## 2024-05-28 MED ORDER — BUTALBITAL-APAP-CAFFEINE 50-325-40 MG PO TABS
1.0000 | ORAL_TABLET | Freq: Two times a day (BID) | ORAL | 0 refills | Status: DC | PRN
  Filled 2024-05-28: qty 60, 30d supply, fill #0

## 2024-05-29 ENCOUNTER — Encounter: Payer: Self-pay | Admitting: Neurology

## 2024-06-07 ENCOUNTER — Other Ambulatory Visit (HOSPITAL_COMMUNITY): Payer: Self-pay

## 2024-06-07 ENCOUNTER — Other Ambulatory Visit: Payer: Self-pay | Admitting: Hematology and Oncology

## 2024-06-07 DIAGNOSIS — C719 Malignant neoplasm of brain, unspecified: Secondary | ICD-10-CM

## 2024-06-07 MED ORDER — PROPRANOLOL HCL 20 MG PO TABS
20.0000 mg | ORAL_TABLET | Freq: Every day | ORAL | 1 refills | Status: AC
  Filled 2024-06-07: qty 180, 90d supply, fill #0

## 2024-06-07 MED ORDER — HYDROCODONE-ACETAMINOPHEN 10-325 MG PO TABS
1.0000 | ORAL_TABLET | Freq: Three times a day (TID) | ORAL | 0 refills | Status: AC | PRN
  Filled 2024-06-07: qty 90, 30d supply, fill #0

## 2024-06-10 ENCOUNTER — Other Ambulatory Visit (HOSPITAL_COMMUNITY): Payer: Self-pay

## 2024-06-11 ENCOUNTER — Other Ambulatory Visit (HOSPITAL_COMMUNITY): Payer: Self-pay

## 2024-06-11 MED ORDER — CYCLOBENZAPRINE HCL 10 MG PO TABS
10.0000 mg | ORAL_TABLET | Freq: Three times a day (TID) | ORAL | 1 refills | Status: DC
  Filled 2024-06-11 (×2): qty 30, 10d supply, fill #0
  Filled 2024-06-20 (×2): qty 30, 10d supply, fill #1

## 2024-06-20 ENCOUNTER — Other Ambulatory Visit (HOSPITAL_COMMUNITY): Payer: Self-pay

## 2024-06-25 ENCOUNTER — Other Ambulatory Visit: Payer: Self-pay | Admitting: Hematology and Oncology

## 2024-06-25 DIAGNOSIS — R4702 Dysphasia: Secondary | ICD-10-CM

## 2024-06-25 DIAGNOSIS — R202 Paresthesia of skin: Secondary | ICD-10-CM

## 2024-06-25 DIAGNOSIS — R0789 Other chest pain: Secondary | ICD-10-CM

## 2024-06-25 DIAGNOSIS — C711 Malignant neoplasm of frontal lobe: Secondary | ICD-10-CM

## 2024-06-25 DIAGNOSIS — R110 Nausea: Secondary | ICD-10-CM

## 2024-06-25 DIAGNOSIS — R519 Headache, unspecified: Secondary | ICD-10-CM

## 2024-06-26 ENCOUNTER — Other Ambulatory Visit: Payer: Self-pay | Admitting: Hematology and Oncology

## 2024-06-26 ENCOUNTER — Other Ambulatory Visit (HOSPITAL_COMMUNITY): Payer: Self-pay

## 2024-06-26 DIAGNOSIS — R110 Nausea: Secondary | ICD-10-CM

## 2024-06-26 DIAGNOSIS — C711 Malignant neoplasm of frontal lobe: Secondary | ICD-10-CM

## 2024-06-26 DIAGNOSIS — R519 Headache, unspecified: Secondary | ICD-10-CM

## 2024-06-26 DIAGNOSIS — R202 Paresthesia of skin: Secondary | ICD-10-CM

## 2024-06-26 DIAGNOSIS — R0789 Other chest pain: Secondary | ICD-10-CM

## 2024-06-26 DIAGNOSIS — R4702 Dysphasia: Secondary | ICD-10-CM

## 2024-06-26 MED ORDER — CYCLOBENZAPRINE HCL 10 MG PO TABS
10.0000 mg | ORAL_TABLET | Freq: Three times a day (TID) | ORAL | 1 refills | Status: AC | PRN
  Filled 2024-06-26: qty 30, 10d supply, fill #0
  Filled 2024-07-05 (×2): qty 30, 10d supply, fill #1

## 2024-06-27 ENCOUNTER — Other Ambulatory Visit (HOSPITAL_COMMUNITY): Payer: Self-pay

## 2024-06-27 MED ORDER — CARISOPRODOL 350 MG PO TABS
350.0000 mg | ORAL_TABLET | Freq: Three times a day (TID) | ORAL | 0 refills | Status: AC
  Filled 2024-06-27: qty 270, 90d supply, fill #0

## 2024-06-27 MED ORDER — BUTALBITAL-APAP-CAFFEINE 50-325-40 MG PO TABS
1.0000 | ORAL_TABLET | Freq: Two times a day (BID) | ORAL | 0 refills | Status: DC | PRN
  Filled 2024-06-27: qty 60, 30d supply, fill #0

## 2024-07-02 ENCOUNTER — Other Ambulatory Visit: Payer: Self-pay | Admitting: Hematology and Oncology

## 2024-07-02 DIAGNOSIS — C719 Malignant neoplasm of brain, unspecified: Secondary | ICD-10-CM

## 2024-07-05 ENCOUNTER — Other Ambulatory Visit: Payer: Self-pay | Admitting: Hematology and Oncology

## 2024-07-05 ENCOUNTER — Other Ambulatory Visit (HOSPITAL_COMMUNITY): Payer: Self-pay

## 2024-07-05 ENCOUNTER — Encounter: Payer: Self-pay | Admitting: Hematology and Oncology

## 2024-07-05 DIAGNOSIS — C719 Malignant neoplasm of brain, unspecified: Secondary | ICD-10-CM

## 2024-07-05 MED ORDER — HYDROCODONE-ACETAMINOPHEN 10-325 MG PO TABS
1.0000 | ORAL_TABLET | Freq: Three times a day (TID) | ORAL | 0 refills | Status: DC | PRN
  Filled 2024-07-05 – 2024-07-09 (×3): qty 90, 30d supply, fill #0

## 2024-07-09 ENCOUNTER — Other Ambulatory Visit (HOSPITAL_COMMUNITY): Payer: Self-pay

## 2024-07-16 ENCOUNTER — Other Ambulatory Visit (HOSPITAL_COMMUNITY): Payer: Self-pay

## 2024-07-17 ENCOUNTER — Encounter (HOSPITAL_COMMUNITY): Payer: Self-pay

## 2024-07-17 ENCOUNTER — Other Ambulatory Visit (HOSPITAL_COMMUNITY): Payer: Self-pay

## 2024-07-17 ENCOUNTER — Other Ambulatory Visit: Payer: Self-pay

## 2024-07-22 ENCOUNTER — Other Ambulatory Visit (HOSPITAL_COMMUNITY): Payer: Self-pay

## 2024-07-22 MED ORDER — CYCLOBENZAPRINE HCL 10 MG PO TABS
10.0000 mg | ORAL_TABLET | Freq: Three times a day (TID) | ORAL | 1 refills | Status: DC | PRN
  Filled 2024-07-22: qty 30, 10d supply, fill #0
  Filled 2024-07-29: qty 30, 10d supply, fill #1

## 2024-07-24 ENCOUNTER — Other Ambulatory Visit: Payer: Self-pay | Admitting: Hematology and Oncology

## 2024-07-25 ENCOUNTER — Other Ambulatory Visit: Payer: Self-pay

## 2024-07-25 ENCOUNTER — Other Ambulatory Visit (HOSPITAL_COMMUNITY): Payer: Self-pay

## 2024-07-25 MED ORDER — BUTALBITAL-APAP-CAFFEINE 50-325-40 MG PO TABS
1.0000 | ORAL_TABLET | Freq: Two times a day (BID) | ORAL | 0 refills | Status: DC | PRN
  Filled 2024-07-25: qty 60, 30d supply, fill #0

## 2024-08-02 ENCOUNTER — Encounter: Payer: Self-pay | Admitting: Hematology and Oncology

## 2024-08-05 ENCOUNTER — Telehealth: Payer: Self-pay

## 2024-08-05 ENCOUNTER — Other Ambulatory Visit (HOSPITAL_COMMUNITY): Payer: Self-pay

## 2024-08-05 NOTE — Telephone Encounter (Signed)
 Called husband regarding message left Friday. Instructed to contact PCP or Dr. Ceasar if Rx for Tamiflu still needed. Told him Dr. Lonn out of the office today.

## 2024-08-06 ENCOUNTER — Other Ambulatory Visit (HOSPITAL_COMMUNITY): Payer: Self-pay

## 2024-08-06 MED ORDER — CYCLOBENZAPRINE HCL 10 MG PO TABS
10.0000 mg | ORAL_TABLET | Freq: Three times a day (TID) | ORAL | 1 refills | Status: DC
  Filled 2024-08-06: qty 30, 10d supply, fill #0
  Filled 2024-08-13: qty 30, 10d supply, fill #1

## 2024-08-08 ENCOUNTER — Other Ambulatory Visit: Payer: Self-pay | Admitting: Hematology and Oncology

## 2024-08-08 ENCOUNTER — Other Ambulatory Visit (HOSPITAL_COMMUNITY): Payer: Self-pay

## 2024-08-08 DIAGNOSIS — C719 Malignant neoplasm of brain, unspecified: Secondary | ICD-10-CM

## 2024-08-08 MED ORDER — HYDROCODONE-ACETAMINOPHEN 10-325 MG PO TABS
1.0000 | ORAL_TABLET | Freq: Three times a day (TID) | ORAL | 0 refills | Status: AC | PRN
  Filled 2024-08-08 – 2024-08-09 (×2): qty 90, 30d supply, fill #0

## 2024-08-09 ENCOUNTER — Other Ambulatory Visit (HOSPITAL_COMMUNITY): Payer: Self-pay

## 2024-08-20 ENCOUNTER — Other Ambulatory Visit: Payer: Self-pay | Admitting: Hematology and Oncology

## 2024-08-20 ENCOUNTER — Other Ambulatory Visit (HOSPITAL_COMMUNITY): Payer: Self-pay

## 2024-08-20 ENCOUNTER — Other Ambulatory Visit: Payer: Self-pay

## 2024-08-20 MED ORDER — BUTALBITAL-APAP-CAFFEINE 50-325-40 MG PO TABS
1.0000 | ORAL_TABLET | Freq: Two times a day (BID) | ORAL | 0 refills | Status: AC | PRN
  Filled 2024-08-20 – 2024-08-22 (×3): qty 60, 30d supply, fill #0

## 2024-08-20 MED ORDER — CYCLOBENZAPRINE HCL 10 MG PO TABS
10.0000 mg | ORAL_TABLET | Freq: Three times a day (TID) | ORAL | 1 refills | Status: AC
  Filled 2024-08-20 – 2024-08-21 (×2): qty 30, 10d supply, fill #0

## 2024-08-21 ENCOUNTER — Other Ambulatory Visit (HOSPITAL_COMMUNITY): Payer: Self-pay

## 2024-08-22 ENCOUNTER — Other Ambulatory Visit (HOSPITAL_COMMUNITY): Payer: Self-pay

## 2024-08-30 ENCOUNTER — Ambulatory Visit: Admitting: Neurology

## 2024-09-03 ENCOUNTER — Ambulatory Visit: Admitting: Neurology

## 2024-11-19 ENCOUNTER — Inpatient Hospital Stay: Admitting: Hematology and Oncology
